# Patient Record
Sex: Male | Born: 1935 | Race: White | Hispanic: No | Smoking: Never smoker
Health system: Southern US, Community
[De-identification: ages and names within clinical notes are randomized; demographics above are authoritative.]

## PROBLEM LIST (undated history)

## (undated) DIAGNOSIS — I2510 Atherosclerotic heart disease of native coronary artery without angina pectoris: Secondary | ICD-10-CM

## (undated) DIAGNOSIS — I4891 Unspecified atrial fibrillation: Secondary | ICD-10-CM

## (undated) DIAGNOSIS — I255 Ischemic cardiomyopathy: Secondary | ICD-10-CM

## (undated) DIAGNOSIS — I639 Cerebral infarction, unspecified: Secondary | ICD-10-CM

## (undated) DIAGNOSIS — K627 Radiation proctitis: Principal | ICD-10-CM

## (undated) DIAGNOSIS — K922 Gastrointestinal hemorrhage, unspecified: Secondary | ICD-10-CM

## (undated) DIAGNOSIS — C61 Malignant neoplasm of prostate: Secondary | ICD-10-CM

## (undated) DIAGNOSIS — Z87442 Personal history of urinary calculi: Secondary | ICD-10-CM

## (undated) DIAGNOSIS — D369 Benign neoplasm, unspecified site: Secondary | ICD-10-CM

## (undated) DIAGNOSIS — I340 Nonrheumatic mitral (valve) insufficiency: Secondary | ICD-10-CM

## (undated) DIAGNOSIS — I236 Thrombosis of atrium, auricular appendage, and ventricle as current complications following acute myocardial infarction: Secondary | ICD-10-CM

## (undated) DIAGNOSIS — N200 Calculus of kidney: Secondary | ICD-10-CM

## (undated) HISTORY — PX: KNEE ARTHROSCOPY: SUR90

## (undated) HISTORY — DX: Radiation proctitis: K62.7

## (undated) HISTORY — DX: Benign neoplasm, unspecified site: D36.9

## (undated) HISTORY — DX: Cerebral infarction, unspecified: I63.9

## (undated) HISTORY — DX: Gastrointestinal hemorrhage, unspecified: K92.2

## (undated) HISTORY — DX: Ischemic cardiomyopathy: I25.5

## (undated) HISTORY — DX: Thrombosis of atrium, auricular appendage, and ventricle as current complications following acute myocardial infarction: I23.6

## (undated) HISTORY — DX: Atherosclerotic heart disease of native coronary artery without angina pectoris: I25.10

## (undated) HISTORY — DX: Malignant neoplasm of prostate: C61

## (undated) HISTORY — DX: Calculus of kidney: N20.0

---

## 1996-03-30 HISTORY — PX: CORONARY ARTERY BYPASS GRAFT: SHX141

## 1999-05-01 ENCOUNTER — Encounter: Payer: Self-pay | Admitting: Emergency Medicine

## 1999-05-01 ENCOUNTER — Emergency Department (HOSPITAL_COMMUNITY): Admission: EM | Admit: 1999-05-01 | Discharge: 1999-05-01 | Payer: Self-pay | Admitting: Emergency Medicine

## 1999-05-01 ENCOUNTER — Emergency Department (HOSPITAL_COMMUNITY): Admission: EM | Admit: 1999-05-01 | Discharge: 1999-05-02 | Payer: Self-pay | Admitting: Emergency Medicine

## 1999-05-02 ENCOUNTER — Encounter: Payer: Self-pay | Admitting: Emergency Medicine

## 2001-03-30 HISTORY — PX: OTHER SURGICAL HISTORY: SHX169

## 2001-11-17 ENCOUNTER — Ambulatory Visit (HOSPITAL_COMMUNITY): Admission: RE | Admit: 2001-11-17 | Discharge: 2001-11-17 | Payer: Self-pay | Admitting: *Deleted

## 2001-11-17 ENCOUNTER — Encounter (INDEPENDENT_AMBULATORY_CARE_PROVIDER_SITE_OTHER): Payer: Self-pay | Admitting: Specialist

## 2002-03-15 ENCOUNTER — Encounter: Payer: Self-pay | Admitting: Emergency Medicine

## 2002-03-15 ENCOUNTER — Emergency Department (HOSPITAL_COMMUNITY): Admission: EM | Admit: 2002-03-15 | Discharge: 2002-03-15 | Payer: Self-pay | Admitting: Emergency Medicine

## 2002-03-18 ENCOUNTER — Inpatient Hospital Stay (HOSPITAL_COMMUNITY): Admission: EM | Admit: 2002-03-18 | Discharge: 2002-03-18 | Payer: Self-pay | Admitting: Emergency Medicine

## 2002-03-18 ENCOUNTER — Encounter: Payer: Self-pay | Admitting: Urology

## 2002-03-20 ENCOUNTER — Ambulatory Visit (HOSPITAL_BASED_OUTPATIENT_CLINIC_OR_DEPARTMENT_OTHER): Admission: RE | Admit: 2002-03-20 | Discharge: 2002-03-20 | Payer: Self-pay | Admitting: Urology

## 2002-03-20 ENCOUNTER — Encounter: Payer: Self-pay | Admitting: Urology

## 2003-01-25 ENCOUNTER — Ambulatory Visit (HOSPITAL_COMMUNITY): Admission: RE | Admit: 2003-01-25 | Discharge: 2003-01-26 | Payer: Self-pay | Admitting: Cardiology

## 2003-03-11 ENCOUNTER — Emergency Department (HOSPITAL_COMMUNITY): Admission: EM | Admit: 2003-03-11 | Discharge: 2003-03-11 | Payer: Self-pay | Admitting: Emergency Medicine

## 2003-05-08 ENCOUNTER — Encounter: Admission: RE | Admit: 2003-05-08 | Discharge: 2003-05-08 | Payer: Self-pay | Admitting: Orthopedic Surgery

## 2004-02-14 ENCOUNTER — Ambulatory Visit: Payer: Self-pay

## 2004-02-18 ENCOUNTER — Ambulatory Visit: Payer: Self-pay | Admitting: Cardiology

## 2004-02-27 ENCOUNTER — Ambulatory Visit: Payer: Self-pay | Admitting: Cardiology

## 2004-02-27 ENCOUNTER — Inpatient Hospital Stay (HOSPITAL_COMMUNITY): Admission: AD | Admit: 2004-02-27 | Discharge: 2004-03-04 | Payer: Self-pay | Admitting: Cardiology

## 2004-03-07 ENCOUNTER — Ambulatory Visit: Payer: Self-pay | Admitting: Internal Medicine

## 2004-03-10 ENCOUNTER — Ambulatory Visit: Payer: Self-pay | Admitting: Cardiology

## 2004-03-18 ENCOUNTER — Ambulatory Visit: Payer: Self-pay | Admitting: Cardiology

## 2004-03-18 ENCOUNTER — Ambulatory Visit: Payer: Self-pay | Admitting: Internal Medicine

## 2004-03-19 ENCOUNTER — Emergency Department (HOSPITAL_COMMUNITY): Admission: EM | Admit: 2004-03-19 | Discharge: 2004-03-20 | Payer: Self-pay | Admitting: Emergency Medicine

## 2004-04-02 ENCOUNTER — Ambulatory Visit: Payer: Self-pay | Admitting: Cardiology

## 2004-04-09 ENCOUNTER — Ambulatory Visit: Payer: Self-pay | Admitting: *Deleted

## 2004-04-16 ENCOUNTER — Ambulatory Visit: Payer: Self-pay | Admitting: Internal Medicine

## 2004-05-07 ENCOUNTER — Ambulatory Visit: Payer: Self-pay | Admitting: Internal Medicine

## 2004-06-03 ENCOUNTER — Ambulatory Visit: Payer: Self-pay | Admitting: Cardiology

## 2004-07-02 ENCOUNTER — Ambulatory Visit: Payer: Self-pay | Admitting: *Deleted

## 2004-07-30 ENCOUNTER — Ambulatory Visit: Payer: Self-pay | Admitting: *Deleted

## 2004-08-05 ENCOUNTER — Ambulatory Visit: Payer: Self-pay | Admitting: Cardiology

## 2004-08-22 ENCOUNTER — Ambulatory Visit: Payer: Self-pay

## 2004-08-22 ENCOUNTER — Ambulatory Visit: Payer: Self-pay | Admitting: Cardiology

## 2004-08-28 ENCOUNTER — Ambulatory Visit: Payer: Self-pay | Admitting: Internal Medicine

## 2004-09-20 ENCOUNTER — Emergency Department (HOSPITAL_COMMUNITY): Admission: EM | Admit: 2004-09-20 | Discharge: 2004-09-20 | Payer: Self-pay | Admitting: Emergency Medicine

## 2004-09-24 ENCOUNTER — Ambulatory Visit: Payer: Self-pay | Admitting: Cardiology

## 2004-10-28 ENCOUNTER — Ambulatory Visit: Payer: Self-pay | Admitting: Internal Medicine

## 2004-11-26 ENCOUNTER — Ambulatory Visit: Payer: Self-pay | Admitting: *Deleted

## 2004-11-26 ENCOUNTER — Ambulatory Visit: Payer: Self-pay | Admitting: Cardiology

## 2004-12-24 ENCOUNTER — Ambulatory Visit: Payer: Self-pay | Admitting: Cardiology

## 2005-02-06 ENCOUNTER — Ambulatory Visit: Payer: Self-pay | Admitting: Cardiology

## 2005-03-04 ENCOUNTER — Ambulatory Visit: Payer: Self-pay | Admitting: *Deleted

## 2005-03-31 ENCOUNTER — Ambulatory Visit: Payer: Self-pay | Admitting: *Deleted

## 2005-05-04 ENCOUNTER — Ambulatory Visit: Payer: Self-pay | Admitting: Internal Medicine

## 2005-05-29 ENCOUNTER — Ambulatory Visit: Payer: Self-pay | Admitting: Cardiology

## 2005-06-10 ENCOUNTER — Ambulatory Visit: Payer: Self-pay

## 2005-06-10 ENCOUNTER — Encounter: Payer: Self-pay | Admitting: Cardiology

## 2005-06-26 ENCOUNTER — Ambulatory Visit: Payer: Self-pay | Admitting: Cardiovascular Disease

## 2005-07-01 ENCOUNTER — Ambulatory Visit: Payer: Self-pay | Admitting: Cardiology

## 2005-07-14 ENCOUNTER — Ambulatory Visit: Payer: Self-pay | Admitting: Cardiology

## 2005-07-30 ENCOUNTER — Ambulatory Visit: Payer: Self-pay | Admitting: Cardiology

## 2005-08-10 ENCOUNTER — Ambulatory Visit: Payer: Self-pay | Admitting: Cardiology

## 2005-08-31 ENCOUNTER — Ambulatory Visit: Payer: Self-pay | Admitting: Internal Medicine

## 2005-09-15 ENCOUNTER — Ambulatory Visit: Payer: Self-pay | Admitting: Cardiology

## 2005-10-13 ENCOUNTER — Ambulatory Visit: Payer: Self-pay | Admitting: Cardiovascular Disease

## 2005-11-10 ENCOUNTER — Ambulatory Visit: Payer: Self-pay | Admitting: Cardiology

## 2005-11-24 ENCOUNTER — Ambulatory Visit: Payer: Self-pay | Admitting: Cardiovascular Disease

## 2005-12-09 ENCOUNTER — Ambulatory Visit: Payer: Self-pay | Admitting: Cardiovascular Disease

## 2005-12-16 ENCOUNTER — Ambulatory Visit: Payer: Self-pay | Admitting: Cardiology

## 2005-12-29 ENCOUNTER — Ambulatory Visit: Payer: Self-pay | Admitting: *Deleted

## 2005-12-30 ENCOUNTER — Observation Stay (HOSPITAL_COMMUNITY): Admission: RE | Admit: 2005-12-30 | Discharge: 2005-12-31 | Payer: Self-pay | Admitting: Ophthalmology

## 2005-12-31 ENCOUNTER — Encounter: Payer: Self-pay | Admitting: Cardiology

## 2005-12-31 ENCOUNTER — Ambulatory Visit: Payer: Self-pay | Admitting: Cardiology

## 2006-01-08 ENCOUNTER — Ambulatory Visit: Payer: Self-pay | Admitting: Cardiology

## 2006-01-29 ENCOUNTER — Ambulatory Visit: Payer: Self-pay | Admitting: Internal Medicine

## 2006-02-01 ENCOUNTER — Ambulatory Visit: Payer: Self-pay | Admitting: Cardiology

## 2006-02-26 ENCOUNTER — Ambulatory Visit: Payer: Self-pay | Admitting: Cardiology

## 2006-03-24 ENCOUNTER — Ambulatory Visit: Payer: Self-pay | Admitting: Cardiology

## 2006-04-19 ENCOUNTER — Ambulatory Visit: Payer: Self-pay | Admitting: Internal Medicine

## 2006-05-17 ENCOUNTER — Ambulatory Visit: Payer: Self-pay | Admitting: Cardiology

## 2006-06-14 ENCOUNTER — Ambulatory Visit: Payer: Self-pay | Admitting: Cardiology

## 2006-07-12 ENCOUNTER — Ambulatory Visit: Payer: Self-pay | Admitting: Cardiology

## 2006-08-09 ENCOUNTER — Ambulatory Visit: Payer: Self-pay | Admitting: Cardiology

## 2006-09-06 ENCOUNTER — Ambulatory Visit: Payer: Self-pay | Admitting: Cardiovascular Disease

## 2006-09-20 ENCOUNTER — Ambulatory Visit: Payer: Self-pay | Admitting: Cardiology

## 2006-10-18 ENCOUNTER — Ambulatory Visit: Payer: Self-pay | Admitting: Cardiovascular Disease

## 2006-11-03 ENCOUNTER — Ambulatory Visit: Payer: Self-pay | Admitting: Cardiology

## 2006-11-11 ENCOUNTER — Ambulatory Visit: Payer: Self-pay

## 2006-11-22 ENCOUNTER — Ambulatory Visit: Payer: Self-pay | Admitting: Cardiology

## 2006-12-20 ENCOUNTER — Ambulatory Visit: Payer: Self-pay | Admitting: Cardiovascular Disease

## 2007-01-17 ENCOUNTER — Ambulatory Visit: Payer: Self-pay | Admitting: Cardiology

## 2007-02-14 ENCOUNTER — Ambulatory Visit: Payer: Self-pay | Admitting: Cardiology

## 2007-03-14 ENCOUNTER — Ambulatory Visit: Payer: Self-pay | Admitting: Cardiovascular Disease

## 2007-04-11 ENCOUNTER — Ambulatory Visit: Payer: Self-pay | Admitting: Cardiovascular Disease

## 2007-05-09 ENCOUNTER — Ambulatory Visit: Payer: Self-pay | Admitting: Cardiology

## 2007-05-19 ENCOUNTER — Ambulatory Visit: Payer: Self-pay | Admitting: Cardiology

## 2007-06-13 ENCOUNTER — Ambulatory Visit: Payer: Self-pay | Admitting: Internal Medicine

## 2007-07-11 ENCOUNTER — Ambulatory Visit: Payer: Self-pay | Admitting: Internal Medicine

## 2007-08-08 ENCOUNTER — Ambulatory Visit: Payer: Self-pay | Admitting: Cardiology

## 2007-08-19 ENCOUNTER — Ambulatory Visit: Payer: Self-pay | Admitting: Cardiology

## 2007-09-05 ENCOUNTER — Ambulatory Visit: Payer: Self-pay | Admitting: Cardiology

## 2007-10-03 ENCOUNTER — Ambulatory Visit: Payer: Self-pay | Admitting: Cardiology

## 2007-10-31 ENCOUNTER — Ambulatory Visit: Payer: Self-pay | Admitting: Internal Medicine

## 2007-11-28 ENCOUNTER — Ambulatory Visit: Payer: Self-pay | Admitting: Internal Medicine

## 2007-12-12 ENCOUNTER — Ambulatory Visit: Payer: Self-pay | Admitting: Cardiology

## 2008-01-09 ENCOUNTER — Ambulatory Visit: Payer: Self-pay | Admitting: Cardiology

## 2008-01-30 ENCOUNTER — Ambulatory Visit: Payer: Self-pay | Admitting: Cardiology

## 2008-02-29 ENCOUNTER — Ambulatory Visit: Payer: Self-pay | Admitting: Internal Medicine

## 2008-03-27 ENCOUNTER — Ambulatory Visit: Payer: Self-pay | Admitting: Cardiology

## 2008-04-23 ENCOUNTER — Ambulatory Visit: Payer: Self-pay | Admitting: Internal Medicine

## 2008-05-21 ENCOUNTER — Ambulatory Visit: Payer: Self-pay | Admitting: Cardiovascular Disease

## 2008-06-20 ENCOUNTER — Ambulatory Visit: Payer: Self-pay | Admitting: Cardiology

## 2008-07-16 ENCOUNTER — Ambulatory Visit: Payer: Self-pay | Admitting: Cardiology

## 2008-08-13 ENCOUNTER — Encounter: Payer: Self-pay | Admitting: Cardiology

## 2008-08-13 ENCOUNTER — Encounter (INDEPENDENT_AMBULATORY_CARE_PROVIDER_SITE_OTHER): Payer: Self-pay | Admitting: *Deleted

## 2008-08-13 LAB — CONVERTED CEMR LAB
ALT: 14 units/L
AST: 16 units/L
Alkaline Phosphatase: 32 units/L
BUN: 23 mg/dL
CO2: 26 meq/L
Calcium: 8.7 mg/dL
Chloride: 110 meq/L
Cholesterol: 117 mg/dL
Creatinine, Ser: 0.9 mg/dL
GGT: 95 units/L
HDL: 34 mg/dL
LDL Cholesterol: 74 mg/dL
Potassium: 4 meq/L
Sodium: 140 meq/L
Total Bilirubin: 0.4 mg/dL
Total Protein: 5.8 g/dL
Triglyceride fasting, serum: 44 mg/dL

## 2008-08-14 ENCOUNTER — Ambulatory Visit: Payer: Self-pay | Admitting: Cardiovascular Disease

## 2008-08-20 ENCOUNTER — Encounter (INDEPENDENT_AMBULATORY_CARE_PROVIDER_SITE_OTHER): Payer: Self-pay | Admitting: *Deleted

## 2008-08-28 ENCOUNTER — Encounter: Payer: Self-pay | Admitting: *Deleted

## 2008-09-10 ENCOUNTER — Ambulatory Visit: Payer: Self-pay | Admitting: Cardiology

## 2008-09-10 LAB — CONVERTED CEMR LAB
POC INR: 1.8
Protime: 16.4

## 2008-09-14 DIAGNOSIS — G459 Transient cerebral ischemic attack, unspecified: Secondary | ICD-10-CM | POA: Insufficient documentation

## 2008-09-14 DIAGNOSIS — I2510 Atherosclerotic heart disease of native coronary artery without angina pectoris: Secondary | ICD-10-CM

## 2008-09-14 DIAGNOSIS — I428 Other cardiomyopathies: Secondary | ICD-10-CM

## 2008-09-14 DIAGNOSIS — K921 Melena: Secondary | ICD-10-CM

## 2008-09-14 DIAGNOSIS — I6350 Cerebral infarction due to unspecified occlusion or stenosis of unspecified cerebral artery: Secondary | ICD-10-CM | POA: Insufficient documentation

## 2008-09-14 DIAGNOSIS — H269 Unspecified cataract: Secondary | ICD-10-CM | POA: Insufficient documentation

## 2008-09-14 DIAGNOSIS — J45909 Unspecified asthma, uncomplicated: Secondary | ICD-10-CM | POA: Insufficient documentation

## 2008-09-14 DIAGNOSIS — E785 Hyperlipidemia, unspecified: Secondary | ICD-10-CM

## 2008-09-18 ENCOUNTER — Ambulatory Visit: Payer: Self-pay | Admitting: Cardiology

## 2008-09-18 DIAGNOSIS — I238 Other current complications following acute myocardial infarction: Secondary | ICD-10-CM

## 2008-10-03 ENCOUNTER — Encounter: Payer: Self-pay | Admitting: *Deleted

## 2008-10-09 ENCOUNTER — Ambulatory Visit: Payer: Self-pay | Admitting: Cardiology

## 2008-10-09 LAB — CONVERTED CEMR LAB
POC INR: 2.7
Prothrombin Time: 19.9 s

## 2008-11-05 ENCOUNTER — Ambulatory Visit: Payer: Self-pay | Admitting: Internal Medicine

## 2008-11-05 LAB — CONVERTED CEMR LAB
POC INR: 2.9
Prothrombin Time: 20.7 s

## 2008-12-10 ENCOUNTER — Ambulatory Visit: Payer: Self-pay | Admitting: Internal Medicine

## 2008-12-10 LAB — CONVERTED CEMR LAB: POC INR: 1.8

## 2009-01-07 ENCOUNTER — Ambulatory Visit: Payer: Self-pay | Admitting: Internal Medicine

## 2009-01-07 LAB — CONVERTED CEMR LAB: POC INR: 2.9

## 2009-02-05 ENCOUNTER — Ambulatory Visit: Payer: Self-pay | Admitting: Cardiology

## 2009-02-05 LAB — CONVERTED CEMR LAB: POC INR: 3

## 2009-03-06 ENCOUNTER — Ambulatory Visit: Payer: Self-pay | Admitting: Cardiology

## 2009-03-06 LAB — CONVERTED CEMR LAB: POC INR: 2.7

## 2009-04-04 ENCOUNTER — Ambulatory Visit: Payer: Self-pay | Admitting: Cardiovascular Disease

## 2009-04-04 LAB — CONVERTED CEMR LAB: POC INR: 3.3

## 2009-04-30 ENCOUNTER — Ambulatory Visit: Payer: Self-pay | Admitting: Internal Medicine

## 2009-04-30 LAB — CONVERTED CEMR LAB: POC INR: 1.8

## 2009-05-28 ENCOUNTER — Ambulatory Visit: Payer: Self-pay | Admitting: Internal Medicine

## 2009-05-28 LAB — CONVERTED CEMR LAB: POC INR: 2.5

## 2009-06-25 ENCOUNTER — Ambulatory Visit: Payer: Self-pay | Admitting: Cardiology

## 2009-06-25 LAB — CONVERTED CEMR LAB: POC INR: 2.4

## 2009-07-23 ENCOUNTER — Ambulatory Visit: Payer: Self-pay | Admitting: Cardiology

## 2009-07-23 LAB — CONVERTED CEMR LAB: POC INR: 2.5

## 2009-08-09 ENCOUNTER — Ambulatory Visit: Payer: Self-pay | Admitting: Cardiovascular Disease

## 2009-08-09 LAB — CONVERTED CEMR LAB: POC INR: 1.7

## 2009-08-13 ENCOUNTER — Ambulatory Visit: Admission: RE | Admit: 2009-08-13 | Discharge: 2009-11-11 | Payer: Self-pay | Admitting: Radiation Oncology

## 2009-08-14 ENCOUNTER — Encounter: Payer: Self-pay | Admitting: Cardiology

## 2009-08-22 ENCOUNTER — Telehealth: Payer: Self-pay | Admitting: Cardiology

## 2009-09-17 ENCOUNTER — Ambulatory Visit: Payer: Self-pay | Admitting: Internal Medicine

## 2009-09-17 LAB — CONVERTED CEMR LAB: POC INR: 1.7

## 2009-09-26 ENCOUNTER — Ambulatory Visit: Payer: Self-pay | Admitting: Cardiology

## 2009-09-26 LAB — CONVERTED CEMR LAB: POC INR: 3.1

## 2009-10-24 ENCOUNTER — Ambulatory Visit: Payer: Self-pay | Admitting: Internal Medicine

## 2009-10-24 LAB — CONVERTED CEMR LAB: POC INR: 2.7

## 2009-11-01 ENCOUNTER — Ambulatory Visit: Payer: Self-pay | Admitting: Cardiology

## 2009-11-11 ENCOUNTER — Ambulatory Visit: Admission: RE | Admit: 2009-11-11 | Discharge: 2009-11-26 | Payer: Self-pay | Admitting: Radiation Oncology

## 2009-11-20 ENCOUNTER — Ambulatory Visit: Payer: Self-pay | Admitting: Internal Medicine

## 2009-11-20 LAB — CONVERTED CEMR LAB: POC INR: 3.1

## 2009-11-27 ENCOUNTER — Encounter: Payer: Self-pay | Admitting: Cardiology

## 2009-12-01 ENCOUNTER — Emergency Department (HOSPITAL_COMMUNITY): Admission: EM | Admit: 2009-12-01 | Discharge: 2009-12-02 | Payer: Self-pay | Admitting: Emergency Medicine

## 2009-12-19 ENCOUNTER — Ambulatory Visit: Payer: Self-pay | Admitting: Cardiology

## 2009-12-19 ENCOUNTER — Telehealth (INDEPENDENT_AMBULATORY_CARE_PROVIDER_SITE_OTHER): Payer: Self-pay | Admitting: *Deleted

## 2009-12-19 LAB — CONVERTED CEMR LAB: POC INR: 3.4

## 2009-12-23 ENCOUNTER — Encounter (HOSPITAL_COMMUNITY): Admission: RE | Admit: 2009-12-23 | Discharge: 2010-03-03 | Payer: Self-pay | Admitting: Cardiology

## 2009-12-23 ENCOUNTER — Encounter: Payer: Self-pay | Admitting: Internal Medicine

## 2009-12-23 ENCOUNTER — Ambulatory Visit: Payer: Self-pay

## 2009-12-23 ENCOUNTER — Ambulatory Visit: Payer: Self-pay | Admitting: Internal Medicine

## 2010-01-02 ENCOUNTER — Ambulatory Visit: Payer: Self-pay | Admitting: Internal Medicine

## 2010-01-02 LAB — CONVERTED CEMR LAB: POC INR: 2.1

## 2010-01-30 ENCOUNTER — Ambulatory Visit: Payer: Self-pay | Admitting: Cardiology

## 2010-01-30 LAB — CONVERTED CEMR LAB: POC INR: 2.4

## 2010-02-27 ENCOUNTER — Ambulatory Visit: Payer: Self-pay | Admitting: Cardiology

## 2010-02-27 LAB — CONVERTED CEMR LAB: POC INR: 2.2

## 2010-03-26 ENCOUNTER — Ambulatory Visit: Payer: Self-pay | Admitting: Cardiology

## 2010-03-26 LAB — CONVERTED CEMR LAB: INR: 1.7

## 2010-03-30 DIAGNOSIS — I639 Cerebral infarction, unspecified: Secondary | ICD-10-CM

## 2010-03-30 DIAGNOSIS — C61 Malignant neoplasm of prostate: Secondary | ICD-10-CM

## 2010-03-30 DIAGNOSIS — Z8673 Personal history of transient ischemic attack (TIA), and cerebral infarction without residual deficits: Secondary | ICD-10-CM

## 2010-03-30 HISTORY — PX: OTHER SURGICAL HISTORY: SHX169

## 2010-03-30 HISTORY — DX: Cerebral infarction, unspecified: I63.9

## 2010-03-30 HISTORY — DX: Malignant neoplasm of prostate: C61

## 2010-04-09 ENCOUNTER — Ambulatory Visit: Admission: RE | Admit: 2010-04-09 | Discharge: 2010-04-09 | Payer: Self-pay | Source: Home / Self Care

## 2010-04-09 LAB — CONVERTED CEMR LAB: POC INR: 1.6

## 2010-04-29 NOTE — Medication Information (Signed)
Summary: rov/eac  Anticoagulant Therapy  Managed by: Cloyde Reams, RN, BSN Referring MD: Olga Millers MD Supervising MD: Shirlee Latch MD, Jereline Ticer Indication 1: Atrial Fibrillation (ICD-427.31) Lab Used: [facility] Site: [facility] INR POC 2.5 INR RANGE 2 - 3  Dietary changes: no    Health status changes: no    Bleeding/hemorrhagic complications: no    Recent/future hospitalizations: yes       Details: Having bx on 07/30/09, will be stopping coumadin 5 days prior to procedure.    Any changes in medication regimen? no    Recent/future dental: no  Any missed doses?: no       Is patient compliant with meds? yes       Allergies (verified): 1)  ! Morphine  Anticoagulation Management History:      The patient is taking warfarin and comes in today for a routine follow up visit.  Positive risk factors for bleeding include an age of 84 years or older, history of CVA/TIA, and history of GI bleeding.  The bleeding index is 'high risk'.  Positive CHADS2 values include Age > 65 years old and Prior Stroke/CVA/TIA.  The start date was 02/18/2004.  Anticoagulation responsible provider: Shirlee Latch MD, Tekisha Darcey.  INR POC: 2.5.  Cuvette Lot#: [id number].  Exp: 08/2010.    Anticoagulation Management Assessment/Plan:      The patient's current anticoagulation dose is Warfarin sodium 5 mg tabs: Use as directed by Anticoagulation Clinic.  The target INR is 2 - 3.  The next INR is due 08/06/2009.  Anticoagulation instructions were given to patient.  Results were reviewed/authorized by Cloyde Reams, RN, BSN.  He was notified by Cloyde Reams RN.         Prior Anticoagulation Instructions: INR 2.4  Continue taking 1 tablet every day, except take 1/2 tablet on Saturday.  Return to clinic in 4 weeks.   Current Anticoagulation Instructions: INR 2.5  Continue on same dosage 1 tablet daily except 1/2 tablet on Saturdays.  Recheck in 1 week post biopsy.

## 2010-04-29 NOTE — Medication Information (Signed)
Summary: rov/cb  Anticoagulant Therapy  Managed by: Cloyde Reams, RN, BSN Referring MD: Olga Millers MD Supervising MD: Jens Som MD, Arlys John Indication 1: Atrial Fibrillation (ICD-427.31) Lab Used: [facility] Site: [facility] INR POC 3.1 INR RANGE 2 - 3  Dietary changes: no    Health status changes: no    Bleeding/hemorrhagic complications: no    Recent/future hospitalizations: no    Any changes in medication regimen? no    Recent/future dental: no  Any missed doses?: no       Is patient compliant with meds? yes       Allergies: 1)  ! Morphine  Anticoagulation Management History:      The patient is taking warfarin and comes in today for a routine follow up visit.  Positive risk factors for bleeding include an age of 55 years or older, history of CVA/TIA, and history of GI bleeding.  The bleeding index is 'high risk'.  Positive CHADS2 values include Age > 4 years old and Prior Stroke/CVA/TIA.  The start date was 02/18/2004.  Anticoagulation responsible provider: Jens Som MD, Arlys John.  INR POC: 3.1.  Cuvette Lot#: [id number].  Exp: 11/2010.    Anticoagulation Management Assessment/Plan:      The patient's current anticoagulation dose is Warfarin sodium 5 mg tabs: Use as directed by Anticoagulation Clinic.  The target INR is 2 - 3.  The next INR is due 10/24/2009.  Anticoagulation instructions were given to patient.  Results were reviewed/authorized by Cloyde Reams, RN, BSN.  He was notified by Cloyde Reams RN.         Prior Anticoagulation Instructions: INR 1.7. Take 1 1/2 tablets today, then take 1 tablet daily. Recheck in 10 days.  Current Anticoagulation Instructions: INR 3.1  Take 1/2 tablet today, then resume same dosage 1 tablet daily except 1/2 tablet on Saturdays.  Recheck in 3 weeks.

## 2010-04-29 NOTE — Medication Information (Signed)
Summary: [name]  Anticoagulant Therapy  Managed by: Weston Brass, PharmD Referring MD: Olga Millers MD Supervising MD: Eden Emms MD, Theron Arista Indication 1: Atrial Fibrillation (ICD-427.31) Lab Used: [facility] Site: [facility] INR POC 1.7 INR RANGE 2 - 3  Dietary changes: no    Health status changes: yes       Details: biopsy positive for prostate cancer.  Has appt with MD next week to determine treatment options  Bleeding/hemorrhagic complications: no    Recent/future hospitalizations: no    Any changes in medication regimen? no    Recent/future dental: no  Any missed doses?: yes     Details: off Coumadin for biopsy.  Restarted Coumadin on 5/5  Is patient compliant with meds? yes       Allergies: 1)  ! Morphine  Anticoagulation Management History:      The patient is taking warfarin and comes in today for a routine follow up visit.  Positive risk factors for bleeding include an age of 30 years or older, history of CVA/TIA, and history of GI bleeding.  The bleeding index is 'high risk'.  Positive CHADS2 values include Age > 52 years old and Prior Stroke/CVA/TIA.  The start date was 02/18/2004.  Anticoagulation responsible provider: Eden Emms MD, Theron Arista.  INR POC: 1.7.  Cuvette Lot#: [id number].  Exp: 10/2010.    Anticoagulation Management Assessment/Plan:      The patient's current anticoagulation dose is Warfarin sodium 5 mg tabs: Use as directed by Anticoagulation Clinic.  The target INR is 2 - 3.  The next INR is due 08/29/2009.  Anticoagulation instructions were given to patient.  Results were reviewed/authorized by Weston Brass, PharmD.  He was notified by Weston Brass PharmD.         Prior Anticoagulation Instructions: INR 2.5  Continue on same dosage 1 tablet daily except 1/2 tablet on Saturdays.  Recheck in 1 week post biopsy.    Current Anticoagulation Instructions: INR 1.7  Take 1 tablet today and tomorrow then resume same dose of 1 tablet every day except 1/2 tablet on  Saturday

## 2010-04-29 NOTE — Medication Information (Signed)
Summary: [name]   Anticoagulant Therapy  Managed by: Lyna Poser, PharmD Referring MD: Olga Millers MD PCP: Oletha Cruel MD: Jens Som MD, Arlys John Indication 1: Atrial Fibrillation (ICD-427.31) Lab Used: [facility] Site: [facility] INR POC 2.2 INR RANGE 2 - 3  Dietary changes: no    Health status changes: no    Bleeding/hemorrhagic complications: no    Recent/future hospitalizations: no    Any changes in medication regimen? no    Recent/future dental: no  Any missed doses?: no       Is patient compliant with meds? yes       Allergies: 1)  ! Morphine  Anticoagulation Management History:      The patient is taking warfarin and comes in today for a routine follow up visit.  Positive risk factors for bleeding include an age of 75 years or older, history of CVA/TIA, and history of GI bleeding.  The bleeding index is 'high risk'.  Positive CHADS2 values include Age > 13 years old and Prior Stroke/CVA/TIA.  The start date was 02/18/2004.  Anticoagulation responsible provider: Jens Som MD, Arlys John.  INR POC: 2.2.  Cuvette Lot#: [id number].  Exp: 02/2011.    Anticoagulation Management Assessment/Plan:      The patient's current anticoagulation dose is Warfarin sodium 5 mg tabs: Use as directed by Anticoagulation Clinic.  The target INR is 2 - 3.  The next INR is due 03/26/2010.  Anticoagulation instructions were given to patient.  Results were reviewed/authorized by Lyna Poser, PharmD.         Prior Anticoagulation Instructions: INR 2.4  Continue on same dosage 1 tablet daily except 1/2 tablet on Tuesdays and Saturdays.  Recheck in 4 weeks.  Current Anticoagulation Instructions: INR 2.2  Continue taking a half tablet on tuesday and saturday. And 1 tablet all other days. Recheck in 4 weeks.

## 2010-04-29 NOTE — Letter (Signed)
[facility]   Imported By: Roderic Ovens 01/01/2010 15:06:20  _____________________________________________________________________  External Attachment:    Type:   Image     Comment:   External Document

## 2010-04-29 NOTE — Medication Information (Signed)
Summary: rov/ewj  Anticoagulant Therapy  Managed by: Bethena Midget, RN, BSN Referring MD: Olga Millers MD Supervising MD: Gala Romney MD, Reuel Boom Indication 1: Atrial Fibrillation (ICD-427.31) Lab Used: [facility] Site: [facility] INR POC 2.5 INR RANGE 2 - 3  Dietary changes: no    Health status changes: no    Bleeding/hemorrhagic complications: no    Recent/future hospitalizations: no    Any changes in medication regimen? no    Recent/future dental: no  Any missed doses?: no       Is patient compliant with meds? yes       Allergies: 1)  ! Morphine  Anticoagulation Management History:      The patient is taking warfarin and comes in today for a routine follow up visit.  Positive risk factors for bleeding include an age of 68 years or older, history of CVA/TIA, and history of GI bleeding.  The bleeding index is 'high risk'.  Positive CHADS2 values include Age > 30 years old and Prior Stroke/CVA/TIA.  The start date was 02/18/2004.  Anticoagulation responsible provider: Kyleigha Markert MD, Reuel Boom.  INR POC: 2.5.  Cuvette Lot#: [id number].  Exp: 06/2010.    Anticoagulation Management Assessment/Plan:      The patient's current anticoagulation dose is Warfarin sodium 5 mg tabs: Use as directed by Anticoagulation Clinic.  The target INR is 2 - 3.  The next INR is due 06/25/2009.  Anticoagulation instructions were given to patient.  Results were reviewed/authorized by Bethena Midget, RN, BSN.  He was notified by Bethena Midget, RN, BSN.         Prior Anticoagulation Instructions: INR 1.8  Start taking 1 tablet daily except 1/2 tablet on Saturdays.  Recheck in 3-4 weeks.    Current Anticoagulation Instructions: INR 2.5 Continue 5mg s daily except 2.5mg s on Saturdays. Recheck in 4 weeks.

## 2010-04-29 NOTE — Medication Information (Signed)
Summary: rov/tm  Anticoagulant Therapy  Managed by: Eda Keys, PharmD Referring MD: Olga Millers MD Supervising MD: Shirlee Latch MD, Kamare Caspers Indication 1: Atrial Fibrillation (ICD-427.31) Lab Used: [facility] Site: [facility] INR POC 2.4 INR RANGE 2 - 3  Dietary changes: no    Health status changes: no    Bleeding/hemorrhagic complications: no    Recent/future hospitalizations: no    Any changes in medication regimen? no    Recent/future dental: no  Any missed doses?: no       Is patient compliant with meds? yes       Allergies: 1)  ! Morphine  Anticoagulation Management History:      The patient is taking warfarin and comes in today for a routine follow up visit.  Positive risk factors for bleeding include an age of 75 years or older, history of CVA/TIA, and history of GI bleeding.  The bleeding index is 'high risk'.  Positive CHADS2 values include Age > 35 years old and Prior Stroke/CVA/TIA.  The start date was 02/18/2004.  Anticoagulation responsible provider: Shirlee Latch MD, Jeston Junkins.  INR POC: 2.4.  Cuvette Lot#: [id number].  Exp: 07/2010.    Anticoagulation Management Assessment/Plan:      The patient's current anticoagulation dose is Warfarin sodium 5 mg tabs: Use as directed by Anticoagulation Clinic.  The target INR is 2 - 3.  The next INR is due 07/23/2009.  Anticoagulation instructions were given to patient.  Results were reviewed/authorized by Eda Keys, PharmD.  He was notified by Eda Keys.         Prior Anticoagulation Instructions: INR 2.5 Continue 5mg s daily except 2.5mg s on Saturdays. Recheck in 4 weeks.   Current Anticoagulation Instructions: INR 2.4  Continue taking 1 tablet every day, except take 1/2 tablet on Saturday.  Return to clinic in 4 weeks.

## 2010-04-29 NOTE — Progress Notes (Signed)
   Phone Note From Other Clinic   Caller: brenda from dr Larey Dresser office Summary of Call: pt needing seed implant and radiation to the prostate. need okay for pt to hold coumadin and aspirin 5 days prior to the procedure. scheduled for june 13th. will foward for dr Jens Som review. note to be faxed to [phone]. Deliah Goody, RN  Aug 22, 2009 2:43 PM   Follow-up for Phone Call        ok to hold coumadin 4 days prior to procedure and resume day of (h/o apical thrombus but no atrial fibrillation). Ferman Hamming, MD, [facility]  Aug 22, 2009 2:51 PM  okay to hold the asa also? Deliah Goody, RN  Aug 23, 2009 12:01 PM   Additional Follow-up for Phone Call Additional follow up Details #1::        yes Ferman Hamming, MD, [facility]  Aug 26, 2009 10:19 AM  note fowarded to dr Nelma Rothman, RN  Aug 27, 2009 2:23 PM\par

## 2010-04-29 NOTE — Progress Notes (Signed)
Summary: Nuclear pre procedure  Phone Note Outgoing Call Call back at [facility] Phone [phone]   Call placed by: Rea College, CMA,  December 19, 2009 4:56 PM Call placed to: Patient Summary of Call: Reviewed information on [employer] Information Sheet (see scanned document for further details).  Spoke with patient.       Nuclear Med Background Indications for Stress Test: Evaluation for Ischemia, Graft Patency   History: Asthma, CABG, COPD, Myocardial Perfusion Study, Stents  History Comments: '08 [id number] infarct, no ischemia, EF=40%     Nuclear Pre-Procedure Cardiac Risk Factors: Carotid Disease, CVA, Family History - CAD, Hypertension, Lipids Height (in): 70  Nuclear Med Study Referring MD:  Olga Millers MD

## 2010-04-29 NOTE — Medication Information (Signed)
Summary: rov/mw  Anticoagulant Therapy  Managed by: Cloyde Reams, RN, BSN Referring MD: Olga Millers MD PCP: Oletha Cruel MD: Bensimhon MD,Daniel Indication 1: Atrial Fibrillation (ICD-427.31) Lab Used: [facility] Site: [facility] INR POC 2.1 INR RANGE 2 - 3  Dietary changes: no    Health status changes: no    Bleeding/hemorrhagic complications: no    Recent/future hospitalizations: no    Any changes in medication regimen? no    Recent/future dental: no  Any missed doses?: no       Is patient compliant with meds? yes       Allergies: 1)  ! Morphine  Anticoagulation Management History:      The patient is taking warfarin and comes in today for a routine follow up visit.  Positive risk factors for bleeding include an age of 75 years or older, history of CVA/TIA, and history of GI bleeding.  The bleeding index is 'high risk'.  Positive CHADS2 values include Age > 75 years old and Prior Stroke/CVA/TIA.  The start date was 02/18/2004.  Anticoagulation responsible Jetty Berland: Bensimhon MD,Daniel.  INR POC: 2.1.  Cuvette Lot#: [id number].  Exp: 01/2011.    Anticoagulation Management Assessment/Plan:      The patient's current anticoagulation dose is Warfarin sodium 5 mg tabs: Use as directed by Anticoagulation Clinic.  The target INR is 2 - 3.  The next INR is due 01/30/2010.  Anticoagulation instructions were given to patient.  Results were reviewed/authorized by Cloyde Reams, RN, BSN.  He was notified by Cloyde Reams RN.         Prior Anticoagulation Instructions: INR 3.4 Skip your dose today. Change your tuesday dose to a half tablet. Continue taking the half tablet on saturday. Take 1 tablet all other days. Recheck in 2 weeks.  Current Anticoagulation Instructions: INR 2.1  Continue on same dosage 1 tablet daily except 1/2 tablet on Tuesdays and Saturdays.  Recheck in 4 weeks.

## 2010-04-29 NOTE — Medication Information (Signed)
Summary: [facility]  Anticoagulant Therapy  Managed by: Lyna Poser, PharmD Referring MD: Olga Millers MD PCP: Oletha Cruel MD: Bensimhon MD,Daniel Indication 1: Atrial Fibrillation (ICD-427.31) Lab Used: [facility] Site: [facility] INR POC 3.4 INR RANGE 2 - 3  Dietary changes: no    Health status changes: no    Bleeding/hemorrhagic complications: no    Recent/future hospitalizations: no    Any changes in medication regimen? no    Recent/future dental: no  Any missed doses?: no       Is patient compliant with meds? yes       Allergies: 1)  ! Morphine  Anticoagulation Management History:      The patient is taking warfarin and comes in today for a routine follow up visit.  Positive risk factors for bleeding include an age of 75 years or older, history of CVA/TIA, and history of GI bleeding.  The bleeding index is 'high risk'.  Positive CHADS2 values include Age > 4 years old and Prior Stroke/CVA/TIA.  The start date was 02/18/2004.  Anticoagulation responsible provider: Bensimhon MD,Daniel.  INR POC: 3.4.  Cuvette Lot#: [id number].  Exp: 12/2010.    Anticoagulation Management Assessment/Plan:      The patient's current anticoagulation dose is Warfarin sodium 5 mg tabs: Use as directed by Anticoagulation Clinic.  The target INR is 2 - 3.  The next INR is due 01/02/2010.  Anticoagulation instructions were given to patient.  Results were reviewed/authorized by Lyna Poser, PharmD.         Prior Anticoagulation Instructions: INR 3.1  Today, Wednesday, August 24th, take Coumadin 0.5 tab (2.5 mg). Then, continue taking Coumadin 1 tab (5 mg) all days except Coumadin 0.5 tab (2.5 mg) on Saturdays. Return to clinic in 4 weeks per patient request.   Current Anticoagulation Instructions: INR 3.4 Skip your dose today. Change your tuesday dose to a half tablet. Continue taking the half tablet on saturday. Take 1 tablet all other days. Recheck in 2 weeks.

## 2010-04-29 NOTE — Letter (Signed)
[facility]   Imported By: Roderic Ovens 10/01/2009 09:16:13  _____________________________________________________________________  External Attachment:    Type:   Image     Comment:   External Document

## 2010-04-29 NOTE — Medication Information (Signed)
Summary: rov/tm  Anticoagulant Therapy  Managed by: Cloyde Reams, RN, BSN Referring MD: Olga Millers MD Supervising MD: Excell Seltzer MD, Casimiro Needle Indication 1: Atrial Fibrillation (ICD-427.31) Lab Used: [facility] Site: [facility] INR POC 3.3 INR RANGE 2 - 3  Dietary changes: yes       Details: Incr food intake over the holidays.    Health status changes: no    Bleeding/hemorrhagic complications: no    Recent/future hospitalizations: no    Any changes in medication regimen? no    Recent/future dental: no  Any missed doses?: no       Is patient compliant with meds? yes       Allergies (verified): 1)  ! Morphine  Anticoagulation Management History:      The patient is taking warfarin and comes in today for a routine follow up visit.  Positive risk factors for bleeding include an age of 75 years or older, history of CVA/TIA, and history of GI bleeding.  The bleeding index is 'high risk'.  Positive CHADS2 values include Age > 75 years old and Prior Stroke/CVA/TIA.  The start date was 02/18/2004.  Anticoagulation responsible provider: Excell Seltzer MD, Casimiro Needle.  INR POC: 3.3.  Cuvette Lot#: [id number].  Exp: 04/2010.    Anticoagulation Management Assessment/Plan:      The patient's current anticoagulation dose is Warfarin sodium 5 mg tabs: Use as directed by Anticoagulation Clinic.  The target INR is 2 - 3.  The next INR is due 04/30/2009.  Anticoagulation instructions were given to patient.  Results were reviewed/authorized by Cloyde Reams, RN, BSN.  He was notified by Cloyde Reams RN.         Prior Anticoagulation Instructions: INR 2.7 Continue 5mg s daily except 2.5mg s on Saturdays. Recheck in 4 weeks.   Current Anticoagulation Instructions: INR 3.3  Take 1/2 tablet today and then start taking 1 tablet daily except 1/2 tablet on Tuesdays and Saturdays.   Recheck in 3-4.

## 2010-04-29 NOTE — Medication Information (Signed)
Summary: rov/tm  Anticoagulant Therapy  Managed by: Elaina Pattee, PharmD Referring MD: Olga Millers MD Supervising MD: Gala Romney MD, Reuel Boom Indication 1: Atrial Fibrillation (ICD-427.31) Lab Used: [facility] Site: [facility] INR POC 1.7 INR RANGE 2 - 3  Dietary changes: yes       Details: Has eaten more salads.  Health status changes: no    Bleeding/hemorrhagic complications: no    Recent/future hospitalizations: yes       Details: Pt just restarted Coumadin last week after biopsy.  Any changes in medication regimen? no    Recent/future dental: no  Any missed doses?: no       Is patient compliant with meds? yes       Allergies: 1)  ! Morphine  Anticoagulation Management History:      The patient is taking warfarin and comes in today for a routine follow up visit.  Positive risk factors for bleeding include an age of 75 years or older, history of CVA/TIA, and history of GI bleeding.  The bleeding index is 'high risk'.  Positive CHADS2 values include Age > 75 years old and Prior Stroke/CVA/TIA.  The start date was 02/18/2004.  Anticoagulation responsible provider: Bensimhon MD, Reuel Boom.  INR POC: 1.7.  Cuvette Lot#: [id number].  Exp: 10/2010.    Anticoagulation Management Assessment/Plan:      The patient's current anticoagulation dose is Warfarin sodium 5 mg tabs: Use as directed by Anticoagulation Clinic.  The target INR is 2 - 3.  The next INR is due 09/26/2009.  Anticoagulation instructions were given to patient.  Results were reviewed/authorized by Elaina Pattee, PharmD.  He was notified by Elaina Pattee, PharmD.         Prior Anticoagulation Instructions: INR 1.7  Take 1 tablet today and tomorrow then resume same dose of 1 tablet every day except 1/2 tablet on Saturday   Current Anticoagulation Instructions: INR 1.7. Take 1 1/2 tablets today, then take 1 tablet daily. Recheck in 10 days.  Appended Document: [facility]    Anticoagulant Therapy  Managed  by: Elaina Pattee, PharmD Referring MD: Olga Millers MD Supervising MD: Gala Romney MD, Reuel Boom Indication 1: Atrial Fibrillation (ICD-427.31) Lab Used: [facility] Site: [facility] INR RANGE 2 - 3           Allergies: 1)  ! Morphine  Anticoagulation Management History:      Positive risk factors for bleeding include an age of 75 years or older, history of CVA/TIA, and history of GI bleeding.  The bleeding index is 'high risk'.  Positive CHADS2 values include Age > 75 years old and Prior Stroke/CVA/TIA.  The start date was 02/18/2004.  Anticoagulation responsible provider: Bensimhon MD, Reuel Boom.  Exp: 10/2010.    Anticoagulation Management Assessment/Plan:      The patient's current anticoagulation dose is Warfarin sodium 5 mg tabs: Use as directed by Anticoagulation Clinic.  The target INR is 2 - 3.  The next INR is due 09/26/2009.  Anticoagulation instructions were given to patient.  Results were reviewed/authorized by Elaina Pattee, PharmD.         Prior Anticoagulation Instructions: INR 1.7. Take 1 1/2 tablets today, then take 1 tablet daily. Recheck in 10 days.

## 2010-04-29 NOTE — Assessment & Plan Note (Signed)
Summary: per walk in/saf /    Primary Provider:  [name]  CC:  check up.  History of Present Illness: The patient is a very pleasant  gentleman who has a history of coronary artery disease status post coronary bypassing graft in 1990. His most recent Myoview was performed on November 11, 2006.  The patient's ejection fraction was 40%.  There was a previous apical infarct, but there was no ischemia noted.  He also has a history of an apical thrombus as well as a prior CVA felt possibly secondary embolic in nature. Prior abdominal ultrasound in May 2006 showed no aneurysm. Carotid Dopplers in November of 2005 showed 0-39% bilaterally. MRA in October of 2007 showed no obstructive disease. I last saw him in June of 2010. Since then he is doing well. There is no dyspnea on exertion, orthopnea, PND, pedal edema, palpitations, syncope or chest pain.  Current Medications (verified): 1)  Aspirin 81 Mg Tbec (Aspirin) .... Take One Tablet By Mouth Daily 2)  Ramipril 2.5 Mg Caps (Ramipril) .... Take One Capsule By Mouth Two Times A Day 3)  Pravastatin Sodium 20 Mg Tabs (Pravastatin Sodium) .... Take One Tablet By Mouth Daily At Bedtime 4)  Fenofibrate Micronized 200 Mg Caps (Fenofibrate Micronized) .Marland Kitchen.. 1 Tab By Mouth Once Daily 5)  Warfarin Sodium 5 Mg Tabs (Warfarin Sodium) .... Use As Directed By Anticoagulation Clinic 6)  Flomax 0.4 Mg Xr24h-Cap (Tamsulosin Hcl) .... Take 1 Capsule Daily 7)  Advair Diskus 100-50 Mcg/dose Misc (Fluticasone-Salmeterol) .... Inhale 1 Puff By Mouth Daily 8)  Xalatan 0.005 % Soln (Latanoprost) .... Instill 1 Drop Into Left Eye Once Daily 9)  Multivitamins   Tabs (Multiple Vitamin) .Marland Kitchen.. 1 Tab By Mouth Once Daily 10)  Cvs Vitamin D 2000 Unit Tabs (Cholecalciferol) .Marland Kitchen.. 1 Tab By Mouth Once Daily  Allergies: 1)  ! Morphine  Past History:  Past Medical History:  History of apical thrombus coronary artery disease Ischemic cardiomyopathy Prior CVA Hyperlipidemia.    History of asthma with worsening with beta blockade history of GI bleed due to doll fully vessel which was clipped Glaucoma excellent cataracts Nephrolithiasis  Past Surgical History: Reviewed history from 09/18/2008 and no changes required.  status post coronary bypassing graft in 1990 (LIMA to the LAD and saphenous vein graft to the diagonal).   Social History: Reviewed history from 09/18/2008 and no changes required. The patient is married, he lives in [place], he has  three children.  He does not use alcohol or tobacco, he never has.  He is a retired Equities trader.  Review of Systems       Recent insect bite on his left elbow but no fevers or chills, productive cough, hemoptysis, dysphasia, odynophagia, melena, hematochezia, dysuria, hematuria, rash, seizure activity, orthopnea, PND, pedal edema, claudication. Remaining systems are negative.   Vital Signs:  Patient profile:   77 year old male Height:      70 inches Weight:      192 pounds BMI:     27.65 Pulse rate:   74 / minute Resp:     14 per minute BP sitting:   96 / 65  (left arm)  Vitals Entered By: Kem Parkinson (November 01, 2009 9:28 AM)  Physical Exam  General:  Well-developed well-nourished in no acute distress.  Skin is warm and dry.  HEENT is normal.  Neck is supple. No thyromegaly.  Chest is clear to auscultation with normal expansion.  Cardiovascular exam is regular rate and rhythm.  Abdominal exam nontender or distended. No masses palpated. Extremities show no edema. Mild erythema surrounding insect bite on left arm neuro grossly intact    EKG  Procedure date:  11/01/2009  Findings:      Sinus rhythm at a rate of 74. Axis normal. Nonspecific ST changes.  Impression & Recommendations:  Problem # 1:  MURAL THROMBUS, APEX OF HEART (ICD-429.79) Continue Coumadin. Goal INR 2-3.  Problem # 2:  COUMADIN THERAPY (ICD-V58.61) Followed in the Coumadin clinic.  Problem # 3:   HYPERCHOLESTEROLEMIA (ICD-272.0) Continue present medications. Lipids and liver monitored by primary care. His updated medication list for this problem includes:    Pravastatin Sodium 20 Mg Tabs (Pravastatin sodium) .Marland Kitchen... Take one tablet by mouth daily at bedtime    Fenofibrate Micronized 200 Mg Caps (Fenofibrate micronized) .Marland Kitchen... 1 tab by mouth once daily  Problem # 4:  TRANSIENT ISCHEMIC ATTACK (ICD-435.9)  Problem # 5:  ASTHMA (ICD-493.90)  His updated medication list for this problem includes:    Advair Diskus 100-50 Mcg/dose Misc (Fluticasone-salmeterol) ..... Inhale 1 puff by mouth daily  Problem # 6:  CARDIOMYOPATHY (ICD-425.4) Continue ACE inhibitor. Intolerant to beta blockers secondary to worsening asthma. His updated medication list for this problem includes:    Aspirin 81 Mg Tbec (Aspirin) .Marland Kitchen... Take one tablet by mouth daily    Ramipril 2.5 Mg Caps (Ramipril) .Marland Kitchen... Take one capsule by mouth two times a day    Warfarin Sodium 5 Mg Tabs (Warfarin sodium) ..... Use as directed by anticoagulation clinic  Orders: EKG w/ Interpretation (93000)  Problem # 7:  CAD (ICD-414.00) Continue aspirin, ACE inhibitor and statin. Repeat Myoview. His updated medication list for this problem includes:    Aspirin 81 Mg Tbec (Aspirin) .Marland Kitchen... Take one tablet by mouth daily    Ramipril 2.5 Mg Caps (Ramipril) .Marland Kitchen... Take one capsule by mouth two times a day    Warfarin Sodium 5 Mg Tabs (Warfarin sodium) ..... Use as directed by anticoagulation clinic  Orders: Nuclear Stress Test (Nuc Stress Test)  Patient Instructions: 1)  Your physician recommends that you schedule a follow-up appointment in: 12 months 2)  Your physician recommends that you continue on your current medications as directed. Please refer to the Current Medication list given to you today. 3)  Your physician has requested that you have an exercise stress myoview.  For further information please visit [email or web address]  Please  follow instruction sheet, as given.  Appended Document: per walk in/saf / Changed to lexiscan myoview. Change handwritten on instruction sheet given to pt.

## 2010-04-29 NOTE — Medication Information (Signed)
Summary: rov/ewj  Anticoagulant Therapy  Managed by: Cloyde Reams, RN, BSN Referring MD: Olga Millers MD PCP: Oletha Cruel MD: Daleen Squibb MD, Maisie Fus Indication 1: Atrial Fibrillation (ICD-427.31) Lab Used: [facility] Site: [facility] INR POC 2.4 INR RANGE 2 - 3  Dietary changes: no    Health status changes: no    Bleeding/hemorrhagic complications: no    Recent/future hospitalizations: no    Any changes in medication regimen? no    Recent/future dental: no  Any missed doses?: no       Is patient compliant with meds? yes       Allergies: 1)  ! Morphine  Anticoagulation Management History:      The patient is taking warfarin and comes in today for a routine follow up visit.  Positive risk factors for bleeding include an age of 74 years or older, history of CVA/TIA, and history of GI bleeding.  The bleeding index is 'high risk'.  Positive CHADS2 values include Age > 22 years old and Prior Stroke/CVA/TIA.  The start date was 02/18/2004.  Anticoagulation responsible provider: Daleen Squibb MD, Maisie Fus.  INR POC: 2.4.  Cuvette Lot#: [id number].  Exp: 02/2011.    Anticoagulation Management Assessment/Plan:      The patient's current anticoagulation dose is Warfarin sodium 5 mg tabs: Use as directed by Anticoagulation Clinic.  The target INR is 2 - 3.  The next INR is due 02/27/2010.  Anticoagulation instructions were given to patient.  Results were reviewed/authorized by Cloyde Reams, RN, BSN.  He was notified by Cloyde Reams RN.         Prior Anticoagulation Instructions: INR 2.1  Continue on same dosage 1 tablet daily except 1/2 tablet on Tuesdays and Saturdays.  Recheck in 4 weeks.    Current Anticoagulation Instructions: INR 2.4  Continue on same dosage 1 tablet daily except 1/2 tablet on Tuesdays and Saturdays.  Recheck in 4 weeks.

## 2010-04-29 NOTE — Medication Information (Signed)
Summary: rov/ewj  Anticoagulant Therapy  Managed by: Cloyde Reams, RN, BSN Referring MD: Olga Millers MD Supervising MD: Gala Romney MD, Reuel Boom Indication 1: Atrial Fibrillation (ICD-427.31) Lab Used: [facility] Site: [facility] INR POC 1.8 INR RANGE 2 - 3  Dietary changes: no    Health status changes: no    Bleeding/hemorrhagic complications: no    Recent/future hospitalizations: no    Any changes in medication regimen? no    Recent/future dental: no  Any missed doses?: no       Is patient compliant with meds? yes       Allergies: 1)  ! Morphine  Anticoagulation Management History:      The patient is taking warfarin and comes in today for a routine follow up visit.  Positive risk factors for bleeding include an age of 75 years or older, history of CVA/TIA, and history of GI bleeding.  The bleeding index is 'high risk'.  Positive CHADS2 values include Age > 75 years old and Prior Stroke/CVA/TIA.  The start date was 02/18/2004.  Anticoagulation responsible provider: Tymika Grilli MD, Reuel Boom.  INR POC: 1.8.  Cuvette Lot#: [id number].  Exp: 06/2010.    Anticoagulation Management Assessment/Plan:      The patient's current anticoagulation dose is Warfarin sodium 5 mg tabs: Use as directed by Anticoagulation Clinic.  The target INR is 2 - 3.  The next INR is due 05/28/2009.  Anticoagulation instructions were given to patient.  Results were reviewed/authorized by Cloyde Reams, RN, BSN.  He was notified by Cloyde Reams RN.         Prior Anticoagulation Instructions: INR 3.3  Take 1/2 tablet today and then start taking 1 tablet daily except 1/2 tablet on Tuesdays and Saturdays.   Recheck in 3-4.    Current Anticoagulation Instructions: INR 1.8  Start taking 1 tablet daily except 1/2 tablet on Saturdays.  Recheck in 3-4 weeks.

## 2010-04-29 NOTE — Assessment & Plan Note (Signed)
Summary: Cardiology Nuclear Testing  Nuclear Med Background Indications for Stress Test: Evaluation for Ischemia, Graft Patency   History: Asthma, CABG, COPD, Myocardial Perfusion Study, Stents  History Comments: '08 [id number] infarct, no ischemia, EF=40%  Symptoms: DOE, Fatigue, Palpitations, SOB    Nuclear Pre-Procedure Cardiac Risk Factors: Carotid Disease, CVA, Family History - CAD, Hypertension, Lipids Caffeine/Decaff Intake: None NPO After: 6:00 PM Lungs: clear IV 0.9% NS with Angio Cath: 22g     IV Site: R Antecubital IV Started by: Irean Hong, RN Chest Size (in) 44     Height (in): 69 Weight (lb): 189 BMI: 28.01  Nuclear Med Study 1 or 2 day study:  1 day     Stress Test Type:  Eugenie Birks Reading MD:  Dietrich Pates, MD     Referring MD:  Olga Millers MD Resting Radionuclide:  Technetium 58m Tetrofosmin     Resting Radionuclide Dose:  11.0 mCi  Stress Radionuclide:  Technetium 37m Tetrofosmin     Stress Radionuclide Dose:  33.0 mCi   Stress Protocol  Max Systolic BP: 103 mm Hg Lexiscan: 0.4 mg   Stress Test Technologist:  Milana Na, EMT-P     Nuclear Technologist:  Domenic Polite, CNMT  Rest Procedure  Myocardial perfusion imaging was performed at rest 45 minutes following the intravenous administration of Technetium 44m Tetrofosmin.  Stress Procedure  The patient received IV Lexiscan 0.4 mg over 15-seconds.  Technetium 55m Tetrofosmin injected at 30-seconds.  There were no significant changes with infusion.  Quantitative spect images were obtained after a 45 minute delay.  QPS Raw Data Images:  Images were motion corrected.  Soft tissue (diaphragm, bowel activty) underlies heart. Stress Images:  RV is prominent.  Defect in the distal septal, distal anterior, mid/disatal inferior and apical walls. Rest Images:  No signficant change fromthe stress images. Subtraction (SDS):  No evidence of ischemia. Transient Ischemic Dilatation:  1.11  (Normal <1.22)  Lung/Heart Ratio:  .32  (Normal <0.45)  Quantitative Gated Spect Images QGS EDV:  111 ml QGS ESV:  63 ml QGS EF:  43 % QGS cine images:  Hypokinesis/akinesis of thedsital septal, apical walls; hypokinesis of hte distal anterior wall.   Overall Impression  Exercise Capacity: Lexiscan with no exercise. BP Response: Normal blood pressure response. Clinical Symptoms: No chest pain ECG Impression: No significant ST segment change suggestive of ischemia. Overall Impression: Scar in the distal septal, distal anterior, mid/distal inferior and apical walls.  No ischemia.  Appended Document: Cardiology Nuclear Testing ok  Appended Document: Cardiology Nuclear Testing pt aware./cy

## 2010-04-29 NOTE — Medication Information (Signed)
Summary: [name]   Anticoagulant Therapy  Managed by: Reina Fuse, PharmD Referring MD: Olga Millers MD PCP: Oletha Cruel MD: Shirlee Latch MD, Brennan Karam Indication 1: Atrial Fibrillation (ICD-427.31) Lab Used: [facility] Site: [facility] INR POC 3.1 INR RANGE 2 - 3  Dietary changes: yes       Details: May have eaten less leafy greens.  Health status changes: no    Bleeding/hemorrhagic complications: no    Recent/future hospitalizations: no    Any changes in medication regimen? no    Recent/future dental: no  Any missed doses?: no       Is patient compliant with meds? yes       Allergies: 1)  ! Morphine  Anticoagulation Management History:      The patient is taking warfarin and comes in today for a routine follow up visit.  Positive risk factors for bleeding include an age of 75 years or older, history of CVA/TIA, and history of GI bleeding.  The bleeding index is 'high risk'.  Positive CHADS2 values include Age > 48 years old and Prior Stroke/CVA/TIA.  The start date was 02/18/2004.  Anticoagulation responsible provider: Shirlee Latch MD, Nieves Chapa.  INR POC: 3.1.  Cuvette Lot#: [id number].  Exp: 12/2010.    Anticoagulation Management Assessment/Plan:      The patient's current anticoagulation dose is Warfarin sodium 5 mg tabs: Use as directed by Anticoagulation Clinic.  The target INR is 2 - 3.  The next INR is due 12/19/2009.  Anticoagulation instructions were given to patient.  Results were reviewed/authorized by Reina Fuse, PharmD.  He was notified by Reina Fuse PharmD.         Prior Anticoagulation Instructions: INR 2.7  Continue on same dosage 1 tablet daily except 1/2 tablet on Saturdays.  Recheck in 4 weeks.    Current Anticoagulation Instructions: INR 3.1  Today, Wednesday, August 24th, take Coumadin 0.5 tab (2.5 mg). Then, continue taking Coumadin 1 tab (5 mg) all days except Coumadin 0.5 tab (2.5 mg) on Saturdays. Return to clinic in 4 weeks per patient request.

## 2010-04-29 NOTE — Medication Information (Signed)
Summary: rov/ewj  Anticoagulant Therapy  Managed by: Cloyde Reams, RN, BSN Referring MD: Olga Millers MD Supervising MD: Gala Romney MD, Reuel Boom Indication 1: Atrial Fibrillation (ICD-427.31) Lab Used[facility]  Site: [facility] INR POC 2.7 INR RANGE 2 - 3  Dietary changes: no    Health status changes: no    Bleeding/hemorrhagic complications: no    Recent/future hospitalizations: no    Any changes in medication regimen? no    Recent/future dental: no  Any missed doses?: no       Is patient compliant with meds? yes      Comments: Started on radiation for prostate CA on 18th of 40 tx.    Allergies: 1)  ! Morphine  Anticoagulation Management History:      The patient is taking warfarin and comes in today for a routine follow up visit.  Positive risk factors for bleeding include an age of 75 years or older, history of CVA/TIA, and history of GI bleeding.  The bleeding index is 'high risk'.  Positive CHADS2 values include Age > 75 years old and Prior Stroke/CVA/TIA.  The start date was 02/18/2004.  Anticoagulation responsible provider: Shterna Laramee MD, Reuel Boom.  INR POC: 2.7.  Cuvette Lot#: [id number].  Exp: 12/2010.    Anticoagulation Management Assessment/Plan:      The patient's current anticoagulation dose is Warfarin sodium 5 mg tabs: Use as directed by Anticoagulation Clinic.  The target INR is 2 - 3.  The next INR is due 11/21/2009.  Anticoagulation instructions were given to patient.  Results were reviewed/authorized by Cloyde Reams, RN, BSN.  He was notified by Cloyde Reams RN.         Prior Anticoagulation Instructions: INR 3.1  Take 1/2 tablet today, then resume same dosage 1 tablet daily except 1/2 tablet on Saturdays.  Recheck in 3 weeks.    Current Anticoagulation Instructions: INR 2.7  Continue on same dosage 1 tablet daily except 1/2 tablet on Saturdays.  Recheck in 4 weeks.

## 2010-04-30 ENCOUNTER — Encounter: Payer: Self-pay | Admitting: Cardiovascular Disease

## 2010-04-30 ENCOUNTER — Ambulatory Visit: Admit: 2010-04-30 | Payer: Self-pay

## 2010-04-30 ENCOUNTER — Encounter (INDEPENDENT_AMBULATORY_CARE_PROVIDER_SITE_OTHER): Payer: Medicare Other

## 2010-04-30 DIAGNOSIS — Z7901 Long term (current) use of anticoagulants: Secondary | ICD-10-CM

## 2010-04-30 DIAGNOSIS — I4891 Unspecified atrial fibrillation: Secondary | ICD-10-CM

## 2010-04-30 LAB — CONVERTED CEMR LAB: POC INR: 2.1

## 2010-05-01 NOTE — Medication Information (Signed)
Summary: rov/mwb   Anticoagulant Therapy  Managed by: Weston Brass, PharmD Referring MD: Olga Millers MD PCP: Oletha Cruel MD: Patty Sermons, MD Indication 1: Atrial Fibrillation (ICD-427.31) Lab Used: [facility] Site: [facility] INR POC 1.6 INR RANGE 2 - 3  Dietary changes: no    Health status changes: no    Bleeding/hemorrhagic complications: no    Recent/future hospitalizations: no    Any changes in medication regimen? no    Recent/future dental: no  Any missed doses?: yes     Details: Missed day before yesterday dose  Is patient compliant with meds? yes       Allergies: 1)  ! Morphine  Anticoagulation Management History:      The patient is taking warfarin and comes in today for a routine follow up visit.  Positive risk factors for bleeding include an age of 75 years or older, history of CVA/TIA, and history of GI bleeding.  The bleeding index is 'high risk'.  Positive CHADS2 values include Age > 61 years old and Prior Stroke/CVA/TIA.  The start date was 02/18/2004.  His last INR was 1.7.  Anticoagulation responsible provider: Patty Sermons, MD.  INR POC: 1.6.  Exp: 05/2011.    Anticoagulation Management Assessment/Plan:      The patient's current anticoagulation dose is Warfarin sodium 5 mg tabs: Use as directed by Anticoagulation Clinic.  The target INR is 2 - 3.  The next INR is due 04/30/2010.  Anticoagulation instructions were given to patient.  Results were reviewed/authorized by Weston Brass, PharmD.  He was notified by Linward Headland, PharmD candidate.         Prior Anticoagulation Instructions: INR 1.7 Take 1.5 tablet (7.5mg ) today, then go back to normal schedule: Take 0.5 tablet on Tue, Sat.  Take 1 tablet on the rest of the days. Recheck INR in 2 weeks on Jan. 11th  Current Anticoagulation Instructions: INR 1.6 (INR goal:  2-3)  Take 1 and 1/2 tablets today and tomorrow.  Take 1/2 tablet on Tuesdays and Saturdays and 1 tablet on Sundays, Mondays,  Wednesdays, Thursdays, and Fridays.  Recheck in 3 weeks

## 2010-05-01 NOTE — Medication Information (Signed)
Summary: rov/mw   Anticoagulant Therapy  Managed by: Bayard Hugger, PharmD Referring MD: Olga Millers MD PCP: Oletha Cruel MD: Jens Som MD, Arlys John Indication 1: Atrial Fibrillation (ICD-427.31) Lab Used: [facility] Site: [facility] INR RANGE 2 - 3  Dietary changes: no    Health status changes: no    Bleeding/hemorrhagic complications: no    Recent/future hospitalizations: no    Any changes in medication regimen? no    Recent/future dental: no  Any missed doses?: yes     Details: Missed a pill on Sunday  Is patient compliant with meds? yes       Allergies: 1)  ! Morphine  Anticoagulation Management History:      Positive risk factors for bleeding include an age of 75 years or older, history of CVA/TIA, and history of GI bleeding.  The bleeding index is 'high risk'.  Positive CHADS2 values include Age > 75 years old and Prior Stroke/CVA/TIA.  The start date was 02/18/2004.  Today's INR is 1.7.  Anticoagulation responsible provider: Jens Som MD, Arlys John.  Cuvette Lot#: [id number].  Exp: 05/2011.    Anticoagulation Management Assessment/Plan:      The patient's current anticoagulation dose is Warfarin sodium 5 mg tabs: Use as directed by Anticoagulation Clinic.  The target INR is 2 - 3.  The next INR is due 04/09/2010.  Anticoagulation instructions were given to patient.  Results were reviewed/authorized by Bayard Hugger, PharmD.  He was notified by Bayard Hugger PharmD.         Prior Anticoagulation Instructions: INR 2.2  Continue taking a half tablet on tuesday and saturday. And 1 tablet all other days. Recheck in 4 weeks.   Current Anticoagulation Instructions: INR 1.7 Take 1.5 tablet (7.5mg ) today, then go back to normal schedule: Take 0.5 tablet on Tue, Sat.  Take 1 tablet on the rest of the days. Recheck INR in 2 weeks on Jan. 11th

## 2010-05-07 NOTE — Medication Information (Signed)
Summary: rov/ewj  Anticoagulant Therapy  Managed by: Bethena Midget, RN, BSN Referring MD: Olga Millers MD PCP: Oletha Cruel MD: Eden Emms MD, Theron Arista Indication 1: Atrial Fibrillation (ICD-427.31) Lab Used: [facility] Site: [facility] INR POC 2.1 INR RANGE 2 - 3  Dietary changes: no    Health status changes: no    Bleeding/hemorrhagic complications: no    Recent/future hospitalizations: no    Any changes in medication regimen? no    Recent/future dental: no  Any missed doses?: no       Is patient compliant with meds? yes       Allergies: 1)  ! Morphine  Anticoagulation Management History:      The patient is taking warfarin and comes in today for a routine follow up visit.  Positive risk factors for bleeding include an age of 75 years or older, history of CVA/TIA, and history of GI bleeding.  The bleeding index is 'high risk'.  Positive CHADS2 values include Age > 69 years old and Prior Stroke/CVA/TIA.  The start date was 02/18/2004.  His last INR was 1.7.  Anticoagulation responsible provider: Eden Emms MD, Theron Arista.  INR POC: 2.1.  Cuvette Lot#: [id number].  Exp: 03/2011.    Anticoagulation Management Assessment/Plan:      The patient's current anticoagulation dose is Warfarin sodium 5 mg tabs: Use as directed by Anticoagulation Clinic.  The target INR is 2 - 3.  The next INR is due 05/28/2010.  Anticoagulation instructions were given to patient.  Results were reviewed/authorized by Bethena Midget, RN, BSN.  He was notified by Bethena Midget, RN, BSN.         Prior Anticoagulation Instructions: INR 1.6 (INR goal:  2-3)  Take 1 and 1/2 tablets today and tomorrow.  Take 1/2 tablet on Tuesdays and Saturdays and 1 tablet on Sundays, Mondays, Wednesdays, Thursdays, and Fridays.  Recheck in 3 weeks  Current Anticoagulation Instructions: INR 2.1 Continue 5mg s everyday except 2.5mg s on Tuesday and Saturdays. Recheck in 4 weeks.

## 2010-05-19 DIAGNOSIS — I4891 Unspecified atrial fibrillation: Secondary | ICD-10-CM

## 2010-05-19 DIAGNOSIS — I480 Paroxysmal atrial fibrillation: Secondary | ICD-10-CM | POA: Insufficient documentation

## 2010-05-19 DIAGNOSIS — G459 Transient cerebral ischemic attack, unspecified: Secondary | ICD-10-CM

## 2010-05-19 DIAGNOSIS — I238 Other current complications following acute myocardial infarction: Secondary | ICD-10-CM

## 2010-05-19 DIAGNOSIS — I6350 Cerebral infarction due to unspecified occlusion or stenosis of unspecified cerebral artery: Secondary | ICD-10-CM

## 2010-05-28 ENCOUNTER — Encounter (INDEPENDENT_AMBULATORY_CARE_PROVIDER_SITE_OTHER): Payer: Medicare Other

## 2010-05-28 ENCOUNTER — Encounter: Payer: Self-pay | Admitting: Cardiology

## 2010-05-28 DIAGNOSIS — I4891 Unspecified atrial fibrillation: Secondary | ICD-10-CM

## 2010-05-28 DIAGNOSIS — Z7901 Long term (current) use of anticoagulants: Secondary | ICD-10-CM

## 2010-05-28 LAB — CONVERTED CEMR LAB: POC INR: 2.3

## 2010-06-05 NOTE — Medication Information (Signed)
Summary: rov/tm   Anticoagulant Therapy  Managed by: Weston Brass, PharmD Referring MD: Olga Millers MD PCP: Oletha Cruel MD: Shirlee Latch MD, Freida Busman Indication 1: Atrial Fibrillation (ICD-427.31) Lab Used: [facility] Site: [facility] INR POC 2.3 INR RANGE 2 - 3  Dietary changes: no    Health status changes: no    Bleeding/hemorrhagic complications: no    Recent/future hospitalizations: no    Any changes in medication regimen? no    Recent/future dental: no  Any missed doses?: no       Is patient compliant with meds? yes       Allergies: 1)  ! Morphine  Anticoagulation Management History:      The patient is taking warfarin and comes in today for a routine follow up visit.  Positive risk factors for bleeding include an age of 75 years or older, history of CVA/TIA, and history of GI bleeding.  The bleeding index is 'high risk'.  Positive CHADS2 values include Age > 75 years old and Prior Stroke/CVA/TIA.  The start date was 02/18/2004.  His last INR was 1.7.  Anticoagulation responsible provider: Shirlee Latch MD, Larysa Pall.  INR POC: 2.3.  Cuvette Lot#: [id number].  Exp: 03/2011.    Anticoagulation Management Assessment/Plan:      The patient's current anticoagulation dose is Warfarin sodium 5 mg tabs: Use as directed by Anticoagulation Clinic.  The target INR is 2 - 3.  The next INR is due 06/25/2010.  Anticoagulation instructions were given to patient.  Results were reviewed/authorized by Weston Brass, PharmD.  He was notified by Margot Chimes PharmD Candidate.         Prior Anticoagulation Instructions: INR 2.1 Continue 5mg s everyday except 2.5mg s on Tuesday and Saturdays. Recheck in 4 weeks.   Current Anticoagulation Instructions: INR 2.3   Continue to take 1 tablet daily except on Tuesdays and Saturdays when you only take 1/2 tablet.  Recheck INR in 4 weeks.

## 2010-06-25 ENCOUNTER — Ambulatory Visit (INDEPENDENT_AMBULATORY_CARE_PROVIDER_SITE_OTHER): Payer: Medicare Other | Admitting: *Deleted

## 2010-06-25 DIAGNOSIS — I4891 Unspecified atrial fibrillation: Secondary | ICD-10-CM

## 2010-06-25 DIAGNOSIS — G459 Transient cerebral ischemic attack, unspecified: Secondary | ICD-10-CM

## 2010-06-25 DIAGNOSIS — I6350 Cerebral infarction due to unspecified occlusion or stenosis of unspecified cerebral artery: Secondary | ICD-10-CM

## 2010-06-25 DIAGNOSIS — I238 Other current complications following acute myocardial infarction: Secondary | ICD-10-CM

## 2010-06-25 LAB — POCT INR: INR: 2.2

## 2010-06-25 NOTE — Patient Instructions (Signed)
INR 2.2  Continue same dose of 1 tablet every day except 1/2 tablet on Tuesday and Saturday.  Recheck INR in 4 weeks.

## 2010-07-23 ENCOUNTER — Ambulatory Visit (INDEPENDENT_AMBULATORY_CARE_PROVIDER_SITE_OTHER): Payer: Medicare Other | Admitting: *Deleted

## 2010-07-23 DIAGNOSIS — I238 Other current complications following acute myocardial infarction: Secondary | ICD-10-CM

## 2010-07-23 DIAGNOSIS — I4891 Unspecified atrial fibrillation: Secondary | ICD-10-CM

## 2010-07-23 DIAGNOSIS — G459 Transient cerebral ischemic attack, unspecified: Secondary | ICD-10-CM

## 2010-07-23 DIAGNOSIS — I6350 Cerebral infarction due to unspecified occlusion or stenosis of unspecified cerebral artery: Secondary | ICD-10-CM

## 2010-07-23 LAB — POCT INR: INR: 2.7

## 2010-08-12 NOTE — Assessment & Plan Note (Signed)
[facility]                            CARDIOLOGY OFFICE NOTE   DARRIUS, MONTANO                      MRN:          [id number]  DATE:08/19/2007                            DOB:          1926/06/29    The patient is a very pleasant 81-year gentleman who has a history of  coronary artery disease status post coronary bypassing graft in 1990.  His most recent Myoview was performed on November 11, 2006.  The patient's  ejection fraction was 40%.  There was a previous apical infarct, but  there was no ischemia noted.  He also has a history of an apical  thrombus as well as a prior CVA felt possibly secondary embolic in  nature.  Since I last saw him, he is doing well with no dyspnea, chest  pain, palpitations or syncope.  There is no pedal edema.   MEDICATIONS AT PRESENT:  1. Pravachol 20 mg p.o. daily.  2. Accolate 20 mg p.o. b.i.d.  3. Coumadin as directed,  4. Tricor 145 mg p.o. daily.  5. Uroxatral 10 mg p.o. daily.  6. Multivitamin.  7. Eye drops.  8. Altace 2.5 mg p.o. daily.  9. Advair 250/50 b.i.d.  10.He also takes enteric-coated aspirin 81 mg p.o. daily.   PHYSICAL EXAM TODAY:  Blood pressure of 101/65, and his pulse is 73.  He  weighs 201 pounds.  HEENT:  Normal.  NECK:  Supple.  No bruits.  CHEST:  Clear.  CARDIOVASCULAR:  Regular rate.  ABDOMEN:  No tenderness.  EXTREMITIES:  No edema.   Electrocardiogram shows a sinus rhythm at a rate of 69.  There are no  significant ST changes.  A prior anterior infarct cannot be excluded.   DIAGNOSES:  1. Coronary artery disease status post coronary bypassing graft - Mr.      Kandel was doing well with no chest pain or shortness of breath.      His recent Myoview showed infarct but no ischemia.  We will      continue with medical therapy including his aspirin, statin and ACE      inhibitor.  He has not tolerated beta blockade in the past due to      asthma.  2. Ischemic cardiomyopathy - as  per #1.  3. History of apical thrombus - given his history of an apical      thrombus and prior cerebrovascular accident, I think he will most      likely require lifelong Coumadin.  We will continue with goal INR      of 2-3, and this is being monitored in our Coumadin Clinic.  Dr.      Wylene Simmer checked his hemoglobin recently, and it was normal.  4. Prior cerebrovascular accident.  5. Hyperlipidemia - he will continue on his present dose of Pravachol,      and his recent lipids and liver were outstanding.  6. History of asthma with beta blockade.   He will continue with his risk factor modification including diet and  exercise.  He does not smoke.  I will see him  back in 12 months.     Madolyn Frieze Jens Som, MD, [facility]  Electronically Signed    BSC/MedQ  DD: 08/19/2007  DT: 08/19/2007  Job #: [id number]   cc:   Gaspar Garbe, M.D.

## 2010-08-12 NOTE — Assessment & Plan Note (Signed)
[facility]                            CARDIOLOGY OFFICE NOTE   AMILLION, MACCHIA                      MRN:          [id number]  DATE:11/03/2006                            DOB:          1926-11-05    [name] returns for followup today.  He has a history of coronary  artery disease, status post coronary artery bypass graft in 1990.  He  also has a history of an apical thrombus.  He has also had a previous  CVA, felt embolic in nature.  Since I last saw him, he is doing  extremely well.  There is no dyspnea, chest pain, palpitations, or  syncope.  There is no pedal edema.  He is contemplating cataract  surgery.   His medications include Pravachol 20 mg p.o. daily, Accolate 20 mg p.o.  b.i.d., Plavix 75 mg p.o. daily, Coumadin as directed, Tricor 145 mg  p.o. daily, Uroxetral 10 mg p.o. daily, multivitamin, eye drops, Altace  2.5 mg p.o. b.i.d., and Advair.   PHYSICAL EXAMINATION:  Blood pressure 100/60.  His pulse is 66.  HEENT:  Normal.  Neck is supple with no bruits.  CHEST:  Clear.  CARDIOVASCULAR:  Regular rate and rhythm.  ABDOMEN:  Benign.  EXTREMITIES:  No edema.   His electrocardiogram shows a sinus rhythm at a rate of 66.  There are  nonspecific ST changes.   DIAGNOSES:  1. Coronary artery disease, status post coronary artery bypass graft:      It has been three years since his previous stress test, and he is      contemplating beginning an exercise program.  We will risk stratify      with a stress Myoview.  If it shows normal perfusion, we will      continue with medical therapy, including his statin and ACE      inhibitor.  Note, he has not tolerated beta blockade in the past      due to asthma.  I will discontinue his Plavix, and we will begin      enteric-coated aspirin 81 mg p.o. daily.  2. Ischemic cardiomyopathy:  As per #1.  3. History of apical thrombus:  He will continue on his Coumadin with      a goal INR of 2-3.   I would be hesitant to discontinue this for any      surgery without bridging the patient due to his history of an      embolic cerebrovascular accident.  4. Prior cerebrovascular accident.  5. Hyperlipidemia:  His most lipids and liver were outstanding.  We      will continue with his present dose of statin.  6. History of asthma with beta blockade.   We will see the patient back in approximately nine months.     Madolyn Frieze Jens Som, MD, [facility]  Electronically Signed    BSC/MedQ  DD: 11/03/2006  DT: 11/03/2006  Job #: [id number]   cc:   Gaspar Garbe, M.D.

## 2010-08-14 ENCOUNTER — Other Ambulatory Visit: Payer: Self-pay | Admitting: Cardiology

## 2010-08-15 NOTE — Assessment & Plan Note (Signed)
[facility]                              CARDIOLOGY OFFICE NOTE   ARISTOTELIS, VILARDI                      MRN:          [id number]  DATE:02/01/2006                            DOB:          03/07/1927    [name] returns for follow-up today.  He has history of coronary artery  bypass grafting and ischemic cardiomyopathy.   There is history of an apical thrombus on his previous echocardiograms.  He  apparently was recently having eye surgery and suffered a CVA.  A CT  apparently showed no acute abnormality.  An MRI also showed no acute  abnormality.  The MRA showed irregularity of the distal inferior and PCA  branches suggestive of intracranial atherosclerosis but there was no  proximal stenosis.  Carotid Dopplers were negative.  A 2-D echocardiogram  showed an ejection fraction of 40-45% and an apical thrombus could not be  excluded.  Since that time, he has done reasonably well.  He still has a  visual field defect.  He has not had chest pain or shortness of breath.   CURRENT MEDICATIONS:  1. Pravachol 20 mg p.o. daily.  2. Accolate 20 mg p.o. b.i.d.  3. Plavix 75 mg p.o. daily.  4. Coumadin as directed.  5. Tricor 145 mg p.o. daily.  6. UroXatral 10 mg p.o. daily.  7. Multivitamin.  8. Eye drops.  9. Altace 2.5 mg p.o. t.i.d.  10.Advair 250/50 one p.o. b.i.d.   PHYSICAL EXAMINATION:  VITAL SIGNS:  She has a blood pressure of 98/64,  pulse is 67.  NECK:  Supple.  I cannot appreciate bruits.  CHEST:  Clear.  CARDIOVASCULAR:  Regular rate and rhythm.  EXTREMITIES:  No edema.   His electrocardiogram shows a sinus rhythm at a rate of 67.  There are  nonspecific anterior T-wave changes, unchanged from previous.   DIAGNOSES:  1. Coronary artery disease status post coronary artery bypass grafting.  2. Ischemic cardiomyopathy.  3. History of apical thrombus.  4. Recent cerebrovascular accident.  5. Hyperlipidemia.  6. History of asthma  with beta-blockade.  7. Hyperlipidemia.   PLAN:  Mr. [name] is doing well from a symptomatic standpoint with no chest  pain or shortness of breath.  We have tried beta-blockers in the past but it  has worsened his asthma.  We will therefore not reattempt this.  We will  continue his present medications.  His most recent lipids were outstanding  and he is scheduled to see Dr. Wylene Simmer in one to two weeks.  We will have  his labs forwarded to [place] for our records.  Our goal LDL should be less than  70 given his history of coronary disease.  He will continue on Coumadin with  a goal INR of 2-3.  I would be hesitant to discontinue this medication given  his recent CVA and history of apical thrombus.  I will see him back in 9  months.    ______________________________  Madolyn Frieze. Jens Som, MD, [facility]    BSC/MedQ  DD: 02/01/2006  DT: 02/01/2006  Job #: [id number]  cc:   Gaspar Garbe, M.D.

## 2010-08-15 NOTE — Discharge Summary (Signed)
NAME:  Donald [name], Donald [name]                         ACCOUNT NO.:  [id number]   MEDICAL RECORD NO.:  [id number]                   PATIENT TYPE:  OIB   LOCATION:  6523                                 FACILITY:  [facility]   PHYSICIAN:  Tereso Newcomer, P.A.                  DATE OF BIRTH:  Oct 15, 1926   DATE OF ADMISSION:  01/25/2003  DATE OF DISCHARGE:                                 DISCHARGE SUMMARY   DISCHARGE DIAGNOSES:  1. Coronary artery disease.     a. Status post coronary artery bypass graft.     b. Abnormal Cardiolite revealing transient cavity dilatation, apical        infarct with surrounding ischemia involving the distal anterior and        inferolateral segments and an ejection fraction of 35%.     c. Status post Taxus stent to the left main and circumflex on January 25, 2003.     d. Ejection fraction 35-40% at catheterization on January 25, 2003.  2. Treated dyslipidemia.  3. History of asthma.   PROCEDURES PERFORMED THIS ADMISSION:  Cardiac catheterization and  percutaneous coronary intervention by Charlies Constable, M.D., on January 25, 2003.  Angiography revealed left main 40%; LAD totalled; circumflex 80%  ostial, 50% distal; RCA 30% proximal; left ventriculogram with apical  akinesis to dyskinesia and an EF of 35-40%.  Status post Taxus stent  placement to the left main/circumflex reducing 80% stenosis to less than  10%.   HOSPITAL COURSE:  Please see the office note from Olga Millers, M.D., on  January 22, 2003.  Briefly, this 77 year old male was seen back in the  office with complaints of increasing chest discomfort.  He had had a recent  Cardiolite that was abnormal revealing transient cavity dilatation,  decreased systolic blood pressure, and worsening ischemia; therefore, he was  set up for cardiac catheterization to further evaluate his abnormal  Cardiolite.  This was performed on January 25, 2003, by Dr. Juanda Chance as noted  above.  He underwent percutaneous  coronary intervention as noted above.  He  tolerated the procedure well and had no immediate complications.  Dr. Juanda Chance  saw the patient on January 26, 2003.  His groin was stable and he felt the  patient was ready for discharge to home.  He noted that the patient will  need to remain on Plavix for six to 12 months.  The patient was enrolled in  the STEEPLE trial.  The research staff will be in touch with the patient to  schedule follow-up.   DISCHARGE LABORATORY DATA:  White count 8200, hemoglobin 14.5, hematocrit  42.8, platelet count 236,000.  Post-procedure troponin I 0.04.   DISCHARGE MEDICATIONS:  1. Plavix 75 mg daily for the next six to 12 months.  2. Aspirin 325 mg daily.  3. Accolate 20 mg b.i.d.  4. Pravachol 20  mg q.h.s.  5. Advair b.i.d.  6. Altace 10 mg daily.  7. Coreg 3.125 mg twice daily.  8. Multivitamin.  9. Albuterol p.r.n.  10.      Nitroglycerin p.r.n. chest pain.   PAIN MANAGEMENT:  The patient is to use Tylenol as needed.  Nitroglycerin as  needed for chest pain.  He is to call our office or 911 for recurrent chest  pain.   ACTIVITY:  No driving or heavy lifting, exertion, work for one week.   DIET:  Low fat, low sodium.   WOUND CARE:  The patient is to call our office for any groin swelling,  bleeding, or bruising.   SPECIAL INSTRUCTIONS:  The patient has been enrolled in the STEEPLE trial.  He will be contacted for 30-day follow-up with the [employer] staff.   FOLLOW-UP:  The patient needs to follow up with Dr. Jens Som on November 15  at 10:15 a.m.  He will need to follow up with Gaspar Garbe, M.D., as  needed.                                                 Tereso Newcomer, P.A.    SW/MEDQ  D:  01/26/2003  T:  01/26/2003  Job:  [id number]   cc:   Olga Millers, M.D.   Gaspar Garbe, M.D.  [address]  [place]  [place] [address]  Fax: [phone]

## 2010-08-15 NOTE — H&P (Signed)
NAME:  Donald [name], HAUK NO.:  [id number]   MEDICAL RECORD NO.:  [id number]         PATIENT TYPE:  INP   LOCATION:                               FACILITY:  [facility]   PHYSICIAN:  Jesse Sans. Wall, M.D.   DATE OF BIRTH:  Sep 17, 1926   DATE OF ADMISSION:  DATE OF DISCHARGE:                                HISTORY & PHYSICAL   CHIEF COMPLAINT:  Hypotension with guaiac-positive stools.   HISTORY OF PRESENT ILLNESS:  This is a pleasant 77 year old male followed in  our practice by Dr. Olga Millers.  His primary care physician is Dr.  Wylene Simmer.  His gastroenterologist is Dr. Roosvelt Harps.  The patient has a  history of coronary artery disease with coronary artery bypass graft surgery  in 1989, as well as a circumflex stent performed in 2004.  Recently, he had  a workup including carotid Dopplers, an adenosine Myoview, and 2-D  echocardiogram.  The adenosine Myoview showed no ischemia, EF 38%.  Carotid  Dopplers showed no significant stenosis.  The echocardiogram showed an  ejection fraction of 55% but there was a nonmobile clot in the left  ventricle.  The patient was placed on Coumadin therapy.  Since that time he  has become very weak, dizzy, and short of breath.  He checked his blood  pressure recently, found his blood pressure to be low.  He stopped taking  his Altace.  He presents to the office today for further evaluation.  On  physical exam, the patient appears to be very pale.  He was found to have  guaiac-positive stool.  A CBC, protime, and basic metabolic panel are  pending.   The patient had recently been out of town.  He had a protime performed at  another clinic.  The results were to be called to our clinic; however, we  never received the results.  The patient is now being admitted for further  evaluation.   PAST MEDICAL HISTORY:  As noted, the patient has a history of coronary  artery bypass graft surgery in 1989.  He had a PTCA stent to the circumflex  in 2004.  He has hyperlipidemia.  He has asthma with bronchospasm.  He has a  history of an apical aneurysm with clot.  He has a history of ischemic  cardiomyopathy with an EF of 35% in the past, recently was 38% by an  adenosine Myoview and 55% by an echocardiogram on February 14, 2004 which  did reveal a nonmobile clot in the left ventricle.  He has an essential  tremor.  He has had recent hypotension, increased heart rate, and dyspnea.   ALLERGIES:  No known drug allergies.   CURRENT MEDICATIONS:  1.  Enteric-coated aspirin 325 mg daily.  2.  Altace 10 mg daily.  3.  Multivitamin daily.  4.  Pravachol 20 mg daily.  5.  Accolate 20 mg b.i.d.  6.  Plavix 75 mg daily.  7.  Foradil 12 mcg daily.  8.  Tricor 145 mg daily.  9.  Uroxatral 10 mg daily.   The patient recently stopped  taking his Altace.  He is also on Coumadin,  dosage not available.   SOCIAL HISTORY:  The patient is married, he lives in [place], he has  three children.  He does not use alcohol or tobacco, he never has.  He is a  retired Equities trader.   FAMILY HISTORY:  His father died at age 45 from an MI.  His mother died at  age 9 from cancer and heart trouble.  He has a sister who has heart  trouble.   REVIEW OF SYSTEMS:  Negative except for the following:  He has had recent  dizziness, shortness of breath, easy bruising, weakness, fatigue, asthma  symptoms but no cough.  He has had problems with urine flow.   PHYSICAL EXAMINATION:  GENERAL:  Reveals a pleasant 77 year old well-  developed, well-nourished white male in no acute distress although he does  appear very pale.  VITAL SIGNS:  Blood pressure 120/78, pulse 89.  Orthostatic pressures were  checked.  Lying he was 99/67 with a pulse of 86, standing 92/59 with a pulse  of 118, after 3 minutes 97/52 with a pulse of 129.  HEENT:  Unremarkable except for very pale conjunctivae.  NECK:  Reveals no bruits, no jugular venous distention.  HEART:   Reveals regular rate and rhythm without murmur.  LUNGS:  Clear but slightly decreased.  ABDOMEN:  Obese, soft, nontender.  EXTREMITIES:  Reveal no significant edema.  Pulses are intact.  SKIN:  Warm and dry.  RECTAL:  Revealed strongly guaiac-positive stool.   LABORATORY DATA:  Labs are pending at this time.  He had an EKG in the  office today that showed normal sinus rhythm with occasional premature  ectopic complexes, nonspecific T wave abnormalities, rate 89 beats per  minute at rest.  Sitting up, his rate increased to 110 and it almost  appeared to be an atrial flutter pattern.  However, this was reviewed by Dr.  Daleen Squibb and was felt to be due to the patient's tremor.   IMPRESSION:  1.  Gastrointestinal bleeding.  2.  Recent Coumadin therapy secondary to left ventricular clot on      echocardiogram.  3.  Recent dyspnea on exertion, hypotension, dizziness, and increased heart      rate.  4.  Recent carotid Dopplers February 14, 2004 showed no significant      stenosis.  5.  Adenosine Myoview February 14, 2004 showing no ischemia, ejection      fraction 38%.  6.  Echocardiogram February 14, 2004 showing ejection fraction 55% with a      nonmobile clot in the left ventricle.  7.  Recent initiation of Coumadin therapy secondary to the abnormal      echocardiogram.  8.  History of ischemic cardiomyopathy in the past.  9.  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  10. History of bronchospasm and asthma.  11. History of coronary artery bypass graft surgery in 1989.  12. History of circumflex stent in 2004.  13. History of hyperlipidemia.   The plan will be to admit the patient to [facility].  He has been  seen by myself as well as Dr. Daleen Squibb.  We have contacted Dr. Joanette Gula  office for a GI consult.  We will hold his aspirin, his Plavix, and his  Coumadin.  We will also hold his Altace antihypertensive medication.  We will type and cross 2 units of packed red blood  cells for possible  transfusion.  We will hydrate him carefully with normal saline.      Markus.Osmond   DR/MEDQ  D:  02/27/2004  T:  02/27/2004  Job:  [id number]   cc:   Gaspar Garbe, M.D.  [address]  [place]  [place] [address]  Fax: [phone]   Althea Grimmer. Luther Parody, M.D.  [address]  [place]  [place] [address]  Fax: [phone]

## 2010-08-15 NOTE — Consult Note (Signed)
NAME:  Donald [name], Donald [name] NO.:  [id number]   MEDICAL RECORD NO.:  [id number]          PATIENT TYPE:  OBV   LOCATION:  4706                         FACILITY:  [facility]   PHYSICIAN:  Deanna Artis. Hickling, M.D.DATE OF BIRTH:  10/08/1926   DATE OF CONSULTATION:  12/30/2005  DATE OF DISCHARGE:                                   CONSULTATION   CHIEF COMPLAINT:  Visual disturbance.   HISTORY OF THE PRESENT CONDITION:  Donald [name] is a 77 year old gentleman  who has had problems with the vision in his left eye.  He was noted to have  an epiretinal membrane with macular distortion and macular edema.   Today he had a closed vitrectomy with membrane peel and endolaser therapy.   The procedure went well.  However, in the postoperative period, the  patient's blood pressure (by his history) dropped 70/40.  There was no blood  pressure noted lower than 85/48 in the postanesthesia care unit record.   The patient was being moved and was sitting up.  He felt dizzy beforehand.  As he sat up, he complained of blurred vision in his eye.  He was examined  by Dr. Luciana Axe who noted full motility of his eyes, reactive pupil, and  evidence of right field cut.   The patient was sent down for a CT scan of the brain, which I have reviewed.  This shows a possible aneurysm of 6.5 mm adjacent to the right vertebral  artery.  This is stable from prior CT scan.  No acute strokes are seen.  No  hemorrhage was seen.   This occurred around 4 p.m.  Around 4:10, his visual symptoms were improved.  I was contacted for code stroke at 1632.  I called to find that the  patient's symptoms had completely resolved.  This was confirmed by Dr.  Luciana Axe after he assessed the patient.   I was asked to see him to evaluate the patient for a left brain TIA.   The patient has had a prior left brain TIA that left him with half an hour  of right hemiparesis about 18 months ago.  He has risk factors for stroke  including a immobile clot in his left ventricle.  Ejection fractions have  been as low as 38% and as high as 55%.  The patient was placed on Coumadin  because of the immobile clot.  He had a circumflex stent placed in 2004 and  is on Plavix.  He had coronary bypass graft surgery in 1989.   The patient was hospitalized in 2005 with gastrointestinal bleed in the  fundus of his stomach.  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   His risk factors for stroke include the immobile clot, cardiomyopathy,  coronary artery disease, apical hypokinesis, akinesis, and dyslipidemia.  Other medical problems include asthma that is exacerbated by beta blockers  and coronary artery disease that did not require further intervention in  October of 2004.   Fortunately, the patient's GI bleed was able to be addressed and he was not  off Coumadin and Plavix for a  long time.   The patient has not had any other significant medical problems I am aware  of.  He has had no other surgeries.   CURRENT MEDICATIONS:  His current medications include:  1. Accolade 20 mg daily.  2. Altace 2.5 mg daily.  3. Plavix 75 mg daily.  4. Pravachol 20 mg daily.  5. Tricor 145 mg daily.  6. UroXatral 10 mg daily.  7. Coumadin 5 mg daily.  8. Advair discus as needed.  9. He is on a variety of other eye medicines including Xalatan OD every      bedtime, Xibrom OS twice daily, Nevanac OS three times daily, Vigamox      OS three times a day.   ALLERGIES:  DRUG ALLERGIES INCLUDE CODEINE WHICH IS REALLY AN INTOLERANCE  THAT CAUSES HIM TO BE QUITE NAUSEATED.   FAMILY HISTORY:  Father died at age 33 of myocardial infarction.  Mother  died a year later at age 40 from stomach cancer and heart disease.   SOCIAL HISTORY:  The patient worked at 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  The patient has been retired for  quite  some time and has volunteered in the [employer].   He is married.  He has three children.  He does not use alcohol or tobacco  and never has.   REVIEW OF SYSTEMS:  Twelve system review is in the chart and is negative  except as noted above.   PHYSICAL EXAMINATION:  GENERAL:  On examination today, this is a well-  developed, well-nourished gentleman with his left eye patched, in no acute  distress.  VITAL SIGNS:  Blood pressure 96/63, resting pulse 58, respirations 20,  temperature 97.7, oxygen saturation 95%.  HEENT:  No bruits, meningismus, or infection.  LUNGS:  Clear.  HEART:  No murmurs.  Pulses normal.  Regular sinus rhythm.  ABDOMEN:  Protuberant.  Bowel sounds normal.  No hepatosplenomegaly.  EXTREMITIES:  Unremarkable.  NEUROLOGIC:  The patient is awake, alert without dysphasia.  Cranial nerves:  Right pupil is 4 mm and reacts, fundus shows some scarring on the right  side, sharp disk margins, visual acuity 20/30; left eye is patched;  symmetric facial strength; midline tongue; air conduction greater than bone  conduction.  Motor examination:  Excellent strength, no drift, fine motor  movements normal.  Sensory examination intact to stereognosis, vibration,  proprioception and primary nociceptive sensation.  Cerebellar finger-to-  nose, rapid repetitive movements were fine, heel-knee-shin was okay.  Gait  was not tested.  Deep due to reflexes were diminished.  The patient had  bilateral flexor plantar responses.   IMPRESSION:  Transient ischemic attack, left brain.  435.8.  I believe this  is related to hypotension and decreased perfusion as opposed to an embolic  event.  I cannot rule out an embolic event given his heart disease.   PLAN:  The patient needs to have an MRI scan of the brain, MRA intracranial  2-D echocardiogram, carotid Doppler, transcranial Doppler, and labs including hemoglobin A1C, serum homocystine, and serum lipid panel.  No   change in Coumadin or Plavix this time.  I appreciate the opportunity to  participate in his care.  We are going to try  to facilitate his studies so that he can get out of the hospital.  His wife  has a hemiparesis from  hemorrhage and his daughter is also disabled.  He  recognizes the importance of maintaining his health so that he can continue  to be a caregiver to his daughter and wife.  He has been married for 57  years.      Deanna Artis. Sharene Skeans, M.D.  Electronically Signed     WHH/MEDQ  D:  12/30/2005  T:  12/31/2005  Job:  [id number]   cc:   Jillyn Hidden A. Rankin, M.D.  Gaspar Garbe, M.D.  Althea Grimmer. Luther Parody, M.D.  Madolyn Frieze Jens Som, MD, [facility]

## 2010-08-15 NOTE — Op Note (Signed)
NAME:  KALA, GASSMANN NO.:  [id number]   MEDICAL RECORD NO.:  [id number]          PATIENT TYPE:  AMB   LOCATION:  SDS                          FACILITY:  [facility]   PHYSICIAN:  Alford Highland. Rankin, M.D.   DATE OF BIRTH:  January 02, 1927   DATE OF PROCEDURE:  12/30/2005  DATE OF DISCHARGE:                                 OPERATIVE REPORT   PREOPERATIVE DIAGNOSIS:  Epiretinal membrane, left eye.   POSTOPERATIVE DIAGNOSIS:  Epiretinal membrane, left eye.   PROCEDURE:  Posterior vitrectomy, membrane peel, internal limiting membrane,  left eye, 25 gauge.   SURGEON:  Alford Highland. Rankin, M.D.   ANESTHESIA:  Local retrobulbar with monitored anesthesia care.   INDICATIONS FOR PROCEDURE:  The patient is a 77 year old man, who has  significant impairment of visual acuity in the left eye function on the  basis of epiretinal membrane, topographic distortion.  The patient  understands this is an attempt to release the traction so as to allow for  enhanced visual functioning and visual acuity improvement.  The patient  understands the risks of anesthesia, including neurovascular injury, death,  loss of the eye, and including but not limited to hemorrhage, infection,  scarring, need for further surgery, no change in vision, loss of vision,  progression of disease despite intervention.  Appropriate signed consent was  obtained and the patient was taken to the operating room.   DESCRIPTION OF PROCEDURE:  In the operating room, appropriate monitoring was  followed by mild sedation.  Marcaine 0.75% was delivered, 5 cc retrobulbar,  followed by additional 5 cc, the latter in the fashion of a modified [employer].  The left periocular region was sterilely prepped and draped in the  usual ophthalmic fashion.  A lid speculum was applied.  Trocars were then  used, 25-gauge trocars used to place the infusion cannula.  The infusion was  then turned on.  The superior trocars were applied.  A 25-gauge  core  vitrectomy was then begun.  Vitreous skirt trimmed 360 degrees.  A 25-gauge  forceps were then used to engage the epiretinal membrane, which apparently  was mostly internal limiting membrane in nature.  This was then removed as a  continuous sheet off the foveal macular region.  No complications occurred.  The peripheral retina was inspected and found to be free of retinal holes or  tears.  At this time, the trocars were removed from the eye under high  pressure, and the wounds were self-sealing.  The infusion was removed.  Subconjunctival injection of Decadron applied.  A sterile patch and a Fox  shield were applied.  The patient tolerated the procedure well without  complication, taken to the PACU in good, to be discharged to home for  followup as an outpatient.      Alford Highland Rankin, M.D.  Electronically Signed     GAR/MEDQ  D:  12/30/2005  T:  12/30/2005  Job:  [id number]

## 2010-08-15 NOTE — Discharge Summary (Signed)
NAME:  Donald [name], Donald [name] NO.:  [id number]   MEDICAL RECORD NO.:  [id number]          PATIENT TYPE:  OBV   LOCATION:  4706                         FACILITY:  [facility]   PHYSICIAN:  Deanna Artis. Hickling, M.D.DATE OF BIRTH:  05/07/1926   DATE OF ADMISSION:  12/30/2005  DATE OF DISCHARGE:  12/31/2005                                 DISCHARGE SUMMARY   DIAGNOSIS AT TIME OF DISCHARGE:  1. Left brain transient ischemic attack.  2. Epiretinal membrane with macular distortion and macular edema, status      post closed vitrectomy with endolaser and membrane peel of left eye by      Dr. Luciana Axe December 30, 2005.  3. History of gastrointestinal bleeding with clipping of the Dieulafoy      vessel in the fundus.  4. History of left ventricular clot, on Coumadin.  5. History of ischemic cardiomyopathy.  6. Coronary artery disease, status post coronary artery bypass graft.  7. Hypercholesterolemia.  8. History of asthma with beta-blockers.  9. Ejection fraction of 35% in the past.  10.Cataracts.  11.Glaucoma.   MEDICINES AT TIME OF DISCHARGE:  1. Scopolamine 1 drop right eye b.i.d.  2. Zymar 1 drop left eye q.i.d.  3. Pred Forte 1 drop left eye 4 times a day.  4. Accolate 20 mg a day.  5. Altace 25 mg a day.  6. Plavix 75 mg a day.  7. Pravachol 20 mg a day.  8. TriCor 145 mg a day.  9. Urotrol 10 mg a day.  10.Coumadin 5 mg a da.  11.Advair as prior to admission.   STUDIES PERFORMED:  1. CT of the brain shows no acute intracranial abnormality.  2. MRI of the brain shows no acute intracranial abnormality.  Mild      atrophy, likely within normal limits given the patient's age.  3. MRA of the head shows irregularity of distal inferior and PCA branches      suggestive of intracranial atherosclerosis.  No proximal stenosis,      aneurysms, or branch blood vessel occlusions.  4. Carotid Doppler shows no ICA stenosis.  5. Transcranial Dopplers performed, results pending.  6.  Two-D echocardiogram shows EF of 40-45% with akinesis of mid-distal      septal wall.  There was akinesis of  the anteroapical wall.  An apical      thrombus cannot be ruled out.  7. EKG shows sinus bradycardia with sinus arrhythmia, low voltage QRS.   LABORATORY STUDIES:  CBC normal.  Chemistry normal.  INR 2.5 with PT 28.3  and PTT of 41.  Cholesterol 124, triglycerides 78, HDL 41, and LDL 67.  Calcium normal.  Hemoglobin A1c was 5.9 and homocystine was 8.4.   HISTORY OF PRESENT ILLNESS:  [name] is a 77 year old right-handed  male who was admitted to the hospital by Dr. Fawn Kirk for elective  vitrectomy and membrane peel secondary to epiretinal membrane with macular  distortion and macular edema.  The patient tolerated surgery well; but,  postoperatively, became dizziness when sitting upright and complained of  loss of vision  in his right eye, suggestive of left cortical compromise.  Dr. Luciana Axe saw the patient in the recovery room and CT scan of the head was  ordered.  His symptoms improved in approximately 10 minutes back to  baseline. It was felt he may have a TIA.  Dr. Sharene Skeans was notified of  symptoms.  Patient does have vascular risk factors for stroke, with notably  of an old left ventricular clot and cardiomyopathy; however, the patient is  therapeutic on Coumadin.  MRI and MRA were ordered which were negative for  acute stroke.  It was felt the patient had a TIA.   He was admitted to the hospital for 24 hours for stroke workup and was found  to be essentially negative.  His 2-D echocardiogram was pending at the time  of his discharge.  Dr. Sharene Skeans plans to follow up in 4-6 weeks.  Patient is  to go by Dr. Ephriam Knuckles office on his way home from the hospital in order to  have left eye patch removed and potential medication adjustment.   CONDITION AT DISCHARGE:  Left eye patch secondary to surgery.  Visual fields  full, right eye.  Otherwise, neurologically  intact.   DISCHARGE PLAN:  1. Continue Coumadin for secondary stroke prevention.  2. Risk factor control with systolic blood pressure less than 130, LDL      less than 100.  3. Follow up with Dr. Luciana Axe immediately after discharge.  4. Follow up with Dr. Sharene Skeans within the next 2 months.  5. Follow up with primary care physician within 1 month.      Annie Main, N.P.      Deanna Artis. Sharene Skeans, M.D.  Electronically Signed    SB/MEDQ  D:  01/01/2006  T:  01/02/2006  Job:  [id number]   cc:   Jillyn Hidden A. Rankin, M.D.  Deanna Artis. Sharene Skeans, M.D.

## 2010-08-15 NOTE — Cardiovascular Report (Signed)
NAME:  Donald [name], HASTE NO.:  [id number]   MEDICAL RECORD NO.:  [id number]          PATIENT TYPE:  INP   LOCATION:  3728                         FACILITY:  [facility]   PHYSICIAN:  Arvilla Meres, M.D. LHCDATE OF BIRTH:  [birth year]   DATE OF PROCEDURE:  03/03/2004  DATE OF DISCHARGE:                              CARDIAC CATHETERIZATION   PRIMARY CARE PHYSICIAN:  Dr. Wylene Simmer.   CARDIOLOGIST:  Dr. Olga Millers.   PATIENT IDENTIFICATION:  Donald [name] is a very pleasant 77 year old male  with history of coronary artery disease, status post CABG in 1990, with a  LIMA to the LAD and a saphenous vein graft to a diagonal.  He also underwent  PTCA and stenting of his proximal left heart catheterization in October 2004  by Dr. Charlies Constable.  He was referred for diagnostic cardiac catheterization  after experiencing chest pain and ST changes in the setting of a clipping of  a Dieulafoy lesion in his stomach this week.   PROCEDURE:  Risks and benefits were explained to Mr. [name].  Consent was  signed and placed on the chart.  The right groin was prepped and draped in  routine sterile fashion.  The area was anesthetized with 1% local lidocaine.  A 6 French sheath was placed in the right femoral artery using a modified  Seldinger technique.  Catheters used were a JL-4, a JR-4, and an IMA  catheter with an exchange wire.   PROCEDURES PERFORMED:  1.  Selective coronary angiography.  2.  LIMA angiography.   COMPLICATIONS:  None apparent.   Of note, an LV-gram was not performed secondary to a documented LV clot on a  recent echocardiogram.   FINDINGS:  Hemodynamic:  Aortic pressure was 107/60, with a mean of 80.   Coronary arteries:  Left main:  50% distal lesion.   LAD had an 80% proximal lesion, then was totally occluded after a small  first diagonal and septal perforator.  There was mild aneurysmal dilatation  prior to the total occlusion.   Left circumflex had  tandem 40% lesions proximally, but otherwise stent was  widely patent.  There was a 60-70% distal lesion in the circumflex prior to  the vessel giving rise to three large obtuse marginals.  This was unchanged  from his previous catheterization in October 2004.   RCA:  This was a dominant moderate-size vessel, with a 20-30% lesion  ostially and a 20-30% tubular lesion proximally.  There were minor  irregularities throughout the distal vessel.  There was a normal-size  posterior descending branch and three posterolateral branches.   Saphenous vein graft to the first diagonal was totally occluded on previous  study.  Of note, the operative report says the graft was to an OM.  However,  previous catheterization by report was saying that the graft was to a  diagonal.   The LIMA to the LAD:  The LIMA was patent, with perhaps a minor irregularity  in the body of the graft.  However, LAD was totaled just after insertion of  the LIMA.  The proximal LAD filled retrograde  from the LIMA to fill two  small diagonals.   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  Given the lack of  symptoms prior to admission, I would favor medical management.  The films  were reviewed with Dr. Randa Evens, who agreed with the management plan.  Should he develop symptoms, he could in the future consider addressing this  percutaneously.      Donald [name]   DB/MEDQ  D:  03/03/2004  T:  03/04/2004  Job:  [id number]

## 2010-08-15 NOTE — Discharge Summary (Signed)
NAME:  Donald [name], [name] NO.:  [id number]   MEDICAL RECORD NO.:  [id number]          PATIENT TYPE:  INP   LOCATION:  3728                         FACILITY:  [facility]   PHYSICIAN:  Olga Millers, M.D. LHCDATE OF BIRTH:  01-23-1927   DATE OF ADMISSION:  02/27/2004  DATE OF DISCHARGE:  03/04/2004                                 DISCHARGE SUMMARY   PRIMARY CARE PHYSICIAN:  Gaspar Garbe, M.D.   CARDIOLOGIST:  Olga Millers, M.D. [facility].   DISCHARGE DIAGNOSES:  1.  Status post gastrointestinal bleed with clipping of the Dieulafoy vessel      in the fundus.  2.  History of ischemia, cardiomyopathy.  3.  Coronary artery disease status post coronary artery bypass graft.  4.  Apical thrombus by echo.  5.  Increased cholesterol.  6.  History of asthma with beta blockers.  7.  EF in the past was 35%.  Recently, 38% by adenosine Myoview and 55% by      echocardiogram on February 14, 2004.  Recently, hypotension, increased      heart rate and dyspnea.  8.  Guaiac positive stools secondary to #1.  9.  Recent Coumadin therapy secondary to left ventricular clot.  10. Status post cardiac catheterization on November 5 showing borderline      disease in distal circumflex which is unchanged from October 2004.      Given lack of symptoms previous admission, would favor medical      management.  Films reviewed with Dr. Samule Ohm.  The patient was cathed by      Dr. Gala Romney.   HOSPITAL COURSE:  Pleasant 77 year old gentleman followed by Dr. Jens Som,  history as stated above who is found at office on day of admission with  hypotension.  The patient complained of feeling very weak and dizzy and  short of breath.  The patient had checked his blood pressure recently and  found his blood pressure to be low so he stopped taking his Altace.  He  presented to the office on day of admission for further evaluation.  Per the  physical exam, the patient was very pale.  He was found to have  guaiac  positive stool.  The patient admitted for further evaluation.  EKG in the  office on day of admission showed normal sinus rhythm with occasional  premature ectopic complexes, nonspecific T-wave abnormalities, rate of 98.  The patient was admitted and GI consult ordered with Dr. Joanette Gula office  for consult.  His aspirin was held.  Also, his Plavix and his Coumadin.  His  Altace was also held.  The patient was typed and crossed for packed RBCs for  transfusion and he was hydrated with normal saline.  On the 30th, Dr.  Luther Parody got in to see patient.  Guaiac positive stools with anemia in the  face of anticoagulation.  He felt the patient needed to have upper endoscopy  review, possible bleeding complications.  The patient to the endoscopy lab  on the 1st and classic Dieulafoy.  That is when the fundus was bleeding and  injected with epinephrine  and clipped.  The patient was placed on b.i.d.  Protonix.  The patient tolerated procedure without any complications.  Continued to monitor blood work.  Received additional transfusions of packed  RBCs, hemoglobin stable.  The patient to be discharged home.   LAB WORK AT DISCHARGE:  PT 13.4 with an INR of 1.0, potassium 3.9, BUN 13,  creatinine 1.1, hemoglobin 10.2, hematocrit 29.8, platelets 305,000.  Other  pertinent lab work this admission:  TSH 3.352, total cholesterol 99,  triglycerides 70, HDL 34, LDL 51.   Discharging vital signs afebrile with temperature of 97.8, heart rate 68,  respirations 18, blood pressure 94/62, 95% on room air.  The patient seen by  Dr. Jens Som today.   DISPOSITION:  The patient seen by Dr. Jens Som on the 6th.  Plan is to  discharge the patient home today and he will be discharged home on 5 mg of  Coumadin today and Wednesday, 2.5 mg of Coumadin on Thursday and check the  PT in Coumadin clinic on Friday.  We will discontinue the aspirin and  Plavix. Will resume aspirin in six weeks at 81 mg.  We will  hold the Altace  and will resume when Dr. Jens Som sees the patient back in the office in 2-4  weeks.  We will also check a hemoglobin at that time.   DISCHARGE MEDICATIONS:  1.  Coumadin as stated above.  2.  No Altace, no aspirin, no Plavix until Dr. Jens Som resumes.  3.  Continue Accolate, Pravachol, Foradil and Tricor.  4.  Tylenol for general discomfort.   Activity as tolerated.  No driving x2 days.  No lifting over 10 pounds x1  week.  Diet as previous.  The patient has a followup appointment Friday at  the [facility] at 12:45 and appointment with Dr. Jens Som December 20  at 11:45.  Will also have CBC done the that time.       MB/MEDQ  D:  03/04/2004  T:  03/04/2004  Job:  [id number]   cc:   Gaspar Garbe, M.D.  [address]  [place]  [place] [address]  Fax: [phone]   Althea Grimmer. Luther Parody, M.D.  [address]  [place]  [place] [address]  Fax: [phone]

## 2010-08-15 NOTE — Cardiovascular Report (Signed)
NAME:  RAFIK, KOPPEL                         ACCOUNT NO.:  [id number]   MEDICAL RECORD NO.:  [id number]                   PATIENT TYPE:  OIB   LOCATION:  6523                                 FACILITY:  [facility]   PHYSICIAN:  Charlies Constable, M.D.                  DATE OF BIRTH:  [birth year]   DATE OF PROCEDURE:  01/25/2003  DATE OF DISCHARGE:                              CARDIAC CATHETERIZATION   PROCEDURE PERFORMED:  Cardiac catheterization.   CARDIOLOGIST:  Charlies Constable, M.D.   CLINICAL HISTORY:  Mr. Mellinger is 77 years old and had bypass surgery by Dr.  Edwyna Shell in 1990 with a LIMA to the LAD and a vein graft to the diagonal  branch of the LAD.  He subsequently had documented occlusion of the vein  graft to the diagonal LAD and occlusion of the LAD after the insertion of  the LIMA graft.  Recently he has developed recurrent angina and he had a  Cardiolite scan, which showed apical scarring with some peri scar ischemia,  but also some ventricular dilatation suggesting more global ischemia.  For  this reason he was brought in for a catheterization.   DESCRIPTION OF PROCEDURE:  The procedure was performed via the right femoral  artery using arterial sheath and 6 French preformed coronary catheters.  A  femoral arterial puncture was performed and Omnipaque contrast was used.  A  LIMA catheter was used for injection of the LIMA graft.  After completion of  the diagnostic study we made a decision to proceed with intervention on the  ostial lesion of the circumflex artery, which also involved a protected left  main.   The patient was enrolled in the STEEPLE trial and was randomized to 0.75  mg/kg of Lovenox.  He also received 300 mg of Plavix.   We used a 7 [place] CLS-4.0 guiding catheter and a PT-2 wire.  We crossed the  lesion in the ostium of the circumflex artery with the wire without too much  difficulty.  We then passed an IVUS catheter to assess the calcification in  the lesion,  and the vessel size and length to help decide the best approach.  We had difficulty passing the IVUS catheter around a 90-degree bend in the  circumflex artery, but were finally able to accomplish this.  We then  predilated with a 3.0 x 15 mm Maverick performing two inflations up to 10  atmospheres for 30 seconds.  The IVUS run did not show too much  calcification and showed that the lesion was about 8 cm in length.  The  proximal reference in the left min before it became ectatic was 3.5 and the  distal reference was 3.5-4.  We chose a 3.5 x 12 mm TAXUS stent.  We  deployed this with one inflation of 14 atmospheres for 30 seconds.  We felt  that the distal edge of the stent  was not fully opposed based on the IVUS  measurements, and so for this reason we dilated the distal portion of the  stent with a 4.0 x 8 mm Quantum Maverick performing one inflation of 12  atmospheres for 30 seconds.  The LAD, which filled a diagonal branch and  septal perforator before it was occluded, arose within the stent, but the  flow was not compromised.   The patient tolerated the procedure well and left the laboratory in  satisfactory condition.   RESULTS:   ANGIOGRAPHIC DATA:  Left Main Coronary Artery:  The left main coronary  artery had a 40% distal stenosis.   Left Anterior Descending Artery:  The left anterior descending artery was  completely occluded after a small diagonal branch and a septal perforator.  There was some aneurysmal dilatation just before the complete occlusion.   Circumflex Artery:  The circumflex artery gave rise to three posterolateral  branches.  There was 80% ostial stenosis in the circumflex artery.  There  was 40% narrowing in the proximal circumflex artery and there was 50%  narrowing in the distal circumflex artery.   Right Coronary Artery:  The right coronary artery was a moderately large  vessel that gave rise to a right ventricular branch, a posterior descending   branch and three posterolateral branches.  There was 3% narrowing in the  proximal right coronary artery.   Grafts:  The LIMA graft to the LAD was patent, but the LAD was completely  occluded just after the insertion site.  The LAD filled retrograde from the  LIMA and supplied two diagonal branches.   The saphenous vein graft to the diagonal branch was completely occluded by  prior study.   VENTRICULOGRAPHIC DATA:  Left Ventriculogram:  The left ventriculogram  performed in the RAO projection showed apical akinesis-to-dyskinesis.  The  anterolateral wall and anteroapical wall moved well, and the inferior wall  moved well.  The estimated ejection fraction was 35-40%.  The aortic  pressure was 89/60 with a mean of 72.  The left ventricular pressure was  89/7.   Following stenting of the left main-ostial circumflex lesion the stenosis  improved from 80% to less than 10%.   CONCLUSIONS:  1. Coronary artery disease status post coronary bypass graft surgery in     1990.  2. Severe native vessel disease with 40% narrowing in the distal left main,     80% ostial stenosis in the circumflex artery with 40% proximal stenosis     and 50% distal stenosis in the circumflex artery, total occlusion of the     proximal left anterior descending, and 30% narrowing in the proximal     right coronary artery.  3. Apical wall akinesis/dyskinesis with an estimated ejection fraction of 35-     40%.  4. Patent left internal mammary artery graft to the mid left anterior     descending, but occlusion of the left anterior descending after insertion     of the graft.  5.     Successful stenting of the left main-ostial circumflex lesion with     improvement in percent narrowing from 80% to less than 10%.   DISPOSITION:  The patient was transferred to the post angioplasty unit for  further observation.  Charlies Constable, M.D.    BB/MEDQ  D:  01/25/2003  T:   01/26/2003  Job:  [id number]   cc:   Gaspar Garbe, M.D.  [address]  [place]  [place] [address]  Fax: [phone]   Olga Millers, M.D.   Cardiopulmonary Laboratory

## 2010-08-15 NOTE — Consult Note (Signed)
NAME:  Donald [name], Donald [name] NO.:  [id number]   MEDICAL RECORD NO.:  [id number]          PATIENT TYPE:  INP   LOCATION:  3730                         FACILITY:  [facility]   PHYSICIAN:  Althea Grimmer. Santogade, M.D.DATE OF BIRTH:  July 26, 1926   DATE OF CONSULTATION:  02/27/2004  DATE OF DISCHARGE:                                   CONSULTATION   HISTORY OF PRESENT ILLNESS:  Donald [name] is a 77 year old male whom I am  asked to see for dark guaiac positive stool with anemia in the face of  anticoagulation.  He has a history of coronary artery disease, and is status  post bypass surgery, as well as PTCA.  Recently, he underwent an  echocardiogram that demonstrated mild tricuspid regurgitation, an ejection  fraction of 55%, and an __________ apex of the left ventricle with a clot  present.  Coumadin was started at a dose of 5 mg a day apparently about 10  days ago.  The patient was on vacation near [place] when he began to feel  lightheaded, short of breath, and dizzy.  He noted that his stool was  somewhat dark.  At the cardiologist's office this afternoon, his hemoglobin  was 8, platelet count normal, PT 20.4, INR 3.  His BUN is 51, with a  creatinine of 1, which suggests an upper GI bleed.  He has no history of  ulcer disease or abdominal pain.  He has had no vomiting or weight loss.  He  had a screening colonoscopy due to age and family history of colon cancer in  his sister.  This was performed in August of 2003, at which time a 0.8 cm  hepatic flexure adenoma was removed.  Repeat colonoscopy has been advised  for August of 2006.  He has never had an upper endoscopy.  It is noted that  his mother has stomach cancer.  He has been taking aspirin, Plavix, and  Coumadin, which are all currently on hold.   PAST MEDICAL HISTORY:  1.  Pertinent for coronary artery disease, status post CABG and bypass.  2.  Asthma.  3.  Kidney stones.   CURRENT MEDICATIONS:  1.  Plavix on hold.  2.  Aspirin on hold.  3.  Coumadin on hold.  4.  Accolate 20 mg b.i.d.  5.  Tricor 145 mg daily.  6.  Albuterol inhalation therapy.  7.  Nitroglycerin.  8.  Multivitamins.  9.  Altace on hold.  10. Pravachol 20 mg q.h.s.  11. Foradil 12 mcg b.i.d.   ALLERGIES:  None reported.   FAMILY HISTORY:  As noted, stomach cancer in mother.  Colon cancer in  sister.   SOCIAL HISTORY:  He is married.  Retired.  Nonsmoker.  Minimal social  drinker.   REVIEW OF SYSTEMS:  GENERAL:  No weight loss or night sweats.  ENDOCRINE:  No history of diabetes or thyroid problems.  METABOLIC:  Moderately  overweight.  SKIN:  No rash or pruritus, though he does have bruising on his  hand and in his right buttock, which is quite extensive.  EYES:  No icterus  or change in vision.  ENT:  No aphthous ulcers or chronic sore throat.  RESPIRATORY:  Positive for dyspnea on exertion and asthma.  CARDIAC:  Mild  exertional angina (see above).  GI:  As above.  GU:  No dysuria or  hematuria.  The remainder of the review of systems is negative.   PHYSICAL EXAMINATION:  GENERAL:  He is an elderly male in no acute distress.  VITAL SIGNS:  Afebrile.  Blood pressure 94/58, pulse 91.  SKIN:  Normal, with the exception of bruising of his left hand, and an  extensive bruise of the right buttock.  HEENT:  Eyes anicteric.  Oropharynx unremarkable.  CHEST:  Sounds clear.  HEART:  Regular rate and rhythm without murmurs.  ABDOMEN:  Obese and soft without mass, tenderness, or organomegaly.  RECTAL:  Not repeated, but was reported guaiac positive.  EXTREMITIES:  Without clubbing, cyanosis, or edema.   IMPRESSION:  A 77 year old male with likely upper GI bleeding in the face of  antiplatelet agents and Coumadin.  He may have ulcer disease, or this could  be related to an angiodysplasia.  He has a strong family history of GI  neoplasia, as well.  I doubt that repeat colonoscopy is needed at this time,  but I do feel that his  upper GI tract needs to be investigated.   PLAN:  Upper endoscopy is reviewed with the patient in terms of technique,  preparation, and risk of complications including bleeding or perforation.  He agrees to proceed.  It will be performed at 7:30 in the morning.  Please  see the orders.  Protonix is begun, and he is NPO except for medicines after  midline.  He will be transfused one unit, and serial hemoglobins checked  throughout the night, with transfusions p.r.n.       PJS/MEDQ  D:  02/27/2004  T:  02/27/2004  Job:  [id number]   cc:   Gaspar Garbe, M.D.  [address]  [place]  [place] [address]  Fax: [phone]   Olga Millers, M.D. [facility]

## 2010-08-20 ENCOUNTER — Ambulatory Visit (INDEPENDENT_AMBULATORY_CARE_PROVIDER_SITE_OTHER): Payer: Medicare Other | Admitting: *Deleted

## 2010-08-20 DIAGNOSIS — I238 Other current complications following acute myocardial infarction: Secondary | ICD-10-CM

## 2010-08-20 DIAGNOSIS — I4891 Unspecified atrial fibrillation: Secondary | ICD-10-CM

## 2010-08-20 DIAGNOSIS — G459 Transient cerebral ischemic attack, unspecified: Secondary | ICD-10-CM

## 2010-08-20 DIAGNOSIS — I6350 Cerebral infarction due to unspecified occlusion or stenosis of unspecified cerebral artery: Secondary | ICD-10-CM

## 2010-08-21 NOTE — Telephone Encounter (Signed)
Pt needs refill of fenofibrate 200 mg to [facility] mail order, was told to contact pcp, however pt states dr Jens Som prescribed-pt [phone] if any questions

## 2010-08-26 NOTE — Telephone Encounter (Signed)
Refill fenofibrate 200 mg.  [facility] Mail order [phone].

## 2010-08-28 ENCOUNTER — Telehealth: Payer: Self-pay | Admitting: Cardiology

## 2010-08-28 NOTE — Telephone Encounter (Signed)
RN s/w Pt re: fenofibrate medication. Pt was told that Primary Care MD should order, however Dr Jens Som began Pt on medication and Primary Care MD has not resolved this issue. RN was given a number [phone] to call [facility] and invoice number [id number] to reference. RN s/w Shawna Orleans @ [facility] stated needed new Rx called in for fenofibrate 200 mg. RN called in new Rx.

## 2010-08-28 NOTE — Telephone Encounter (Signed)
Pt is suppose to have fenofibrate 200mg  qd and he has trouble getting it filled he has been given the run around and he needs to talk to someone asap

## 2010-09-03 ENCOUNTER — Other Ambulatory Visit: Payer: Self-pay | Admitting: Cardiology

## 2010-09-11 ENCOUNTER — Encounter: Payer: Self-pay | Admitting: Cardiology

## 2010-09-16 ENCOUNTER — Ambulatory Visit (INDEPENDENT_AMBULATORY_CARE_PROVIDER_SITE_OTHER): Payer: Medicare Other | Admitting: *Deleted

## 2010-09-16 DIAGNOSIS — I4891 Unspecified atrial fibrillation: Secondary | ICD-10-CM

## 2010-09-16 DIAGNOSIS — G459 Transient cerebral ischemic attack, unspecified: Secondary | ICD-10-CM

## 2010-09-16 DIAGNOSIS — I6350 Cerebral infarction due to unspecified occlusion or stenosis of unspecified cerebral artery: Secondary | ICD-10-CM

## 2010-09-16 DIAGNOSIS — I238 Other current complications following acute myocardial infarction: Secondary | ICD-10-CM

## 2010-09-16 LAB — POCT INR: INR: 2.5

## 2010-10-14 ENCOUNTER — Ambulatory Visit (INDEPENDENT_AMBULATORY_CARE_PROVIDER_SITE_OTHER): Payer: Medicare Other | Admitting: *Deleted

## 2010-10-14 DIAGNOSIS — I6350 Cerebral infarction due to unspecified occlusion or stenosis of unspecified cerebral artery: Secondary | ICD-10-CM

## 2010-10-14 DIAGNOSIS — I4891 Unspecified atrial fibrillation: Secondary | ICD-10-CM

## 2010-10-14 DIAGNOSIS — G459 Transient cerebral ischemic attack, unspecified: Secondary | ICD-10-CM

## 2010-10-14 DIAGNOSIS — I238 Other current complications following acute myocardial infarction: Secondary | ICD-10-CM

## 2010-10-31 ENCOUNTER — Ambulatory Visit (INDEPENDENT_AMBULATORY_CARE_PROVIDER_SITE_OTHER): Payer: Medicare Other | Admitting: Cardiology

## 2010-10-31 ENCOUNTER — Encounter: Payer: Self-pay | Admitting: Cardiology

## 2010-10-31 ENCOUNTER — Ambulatory Visit (INDEPENDENT_AMBULATORY_CARE_PROVIDER_SITE_OTHER): Payer: Medicare Other | Admitting: *Deleted

## 2010-10-31 VITALS — BP 110/64 | HR 70 | Resp 18 | Ht 70.0 in | Wt 185.1 lb

## 2010-10-31 DIAGNOSIS — I428 Other cardiomyopathies: Secondary | ICD-10-CM

## 2010-10-31 DIAGNOSIS — G459 Transient cerebral ischemic attack, unspecified: Secondary | ICD-10-CM

## 2010-10-31 DIAGNOSIS — I238 Other current complications following acute myocardial infarction: Secondary | ICD-10-CM

## 2010-10-31 DIAGNOSIS — E785 Hyperlipidemia, unspecified: Secondary | ICD-10-CM

## 2010-10-31 DIAGNOSIS — I4891 Unspecified atrial fibrillation: Secondary | ICD-10-CM

## 2010-10-31 DIAGNOSIS — I2510 Atherosclerotic heart disease of native coronary artery without angina pectoris: Secondary | ICD-10-CM

## 2010-10-31 DIAGNOSIS — I6350 Cerebral infarction due to unspecified occlusion or stenosis of unspecified cerebral artery: Secondary | ICD-10-CM

## 2010-10-31 LAB — POCT INR: INR: 1.9

## 2010-10-31 NOTE — Assessment & Plan Note (Signed)
Continue Coumadin. Hemoglobin monitored by primary care. 

## 2010-10-31 NOTE — Assessment & Plan Note (Signed)
Continue aspirin, ACE inhibitor and statin. He has not tolerated beta blockers because of worsening asthma.

## 2010-10-31 NOTE — Progress Notes (Signed)
HPI:The patient is a very pleasant  gentleman who has a history of coronary artery disease status post coronary bypassing graft in 1990. His most recent Myoview was performed in Sept 2011.  The patient's ejection fraction was 43%.  There was a previous infarct, but there was no ischemia noted.  He also has a history of an apical thrombus as well as a prior CVA felt possibly secondary embolic in nature. Prior abdominal ultrasound in May 2006 showed no aneurysm. Carotid Dopplers in November of 2005 showed 0-39% bilaterally. MRA in October of 2007 showed no obstructive disease. I last saw him in August of 2011. Since then he is doing well. There is no dyspnea on exertion, orthopnea, PND, pedal edema, palpitations, syncope or chest pain.  Current Outpatient Prescriptions  Medication Sig Dispense Refill  . aspirin 81 MG tablet Take 81 mg by mouth daily.        . Cholecalciferol (CVS VITAMIN D) 2000 UNITS CAPS Take 1 capsule by mouth daily.        . fenofibrate micronized (LOFIBRA) 200 MG capsule Take 200 mg by mouth daily.        . Fluticasone-Salmeterol (ADVAIR DISKUS) 100-50 MCG/DOSE AEPB Inhale 1 puff into the lungs daily.        Marland Kitchen latanoprost (XALATAN) 0.005 % ophthalmic solution Place 1 drop into the left eye daily.        . Multiple Vitamin (MULTIVITAMIN) capsule Take 1 capsule by mouth daily.        . pravastatin (PRAVACHOL) 20 MG tablet Take 20 mg by mouth at bedtime.        . ramipril (ALTACE) 2.5 MG capsule Take 2.5 mg by mouth 2 (two) times daily.        . Tamsulosin HCl (FLOMAX) 0.4 MG CAPS Take 0.4 mg by mouth daily.        Marland Kitchen warfarin (COUMADIN) 5 MG tablet TAKE AS DIRECTED BY ANTICOAGULATION CLINIC  90 tablet  2     Past Medical History  Diagnosis Date  . Post-infarction apical thrombus   . CAD (coronary artery disease)   . Ischemic cardiomyopathy   . CVA (cerebral infarction)   . Hyperlipidemia   . Asthma     with worsening with beta blockade  . GI bleed     due to doll fully  vessel which was clipped  . Glaucoma     excellent cataracts  . Nephrolithiasis     Past Surgical History  Procedure Date  . Coronary artery bypass graft 1990    LIMA to the LAD and saphenous vein graft tothe diagonal    History   Social History  . Marital Status: Married    Spouse Name: N/A    Number of Children: 3  . Years of Education: N/A   Occupational History  . Retired Equities trader    Social History Main Topics  . Smoking status: Never Smoker   . Smokeless tobacco: Not on file  . Alcohol Use: No  . Drug Use: No  . Sexually Active: Not on file   Other Topics Concern  . Not on file   Social History Narrative  . No narrative on file    ROS: no fevers or chills, productive cough, hemoptysis, dysphasia, odynophagia, melena, hematochezia, dysuria, hematuria, rash, seizure activity, orthopnea, PND, pedal edema, claudication. Remaining systems are negative.  Physical Exam: Well-developed well-nourished in no acute distress.  Skin is warm and dry.  HEENT is normal.  Neck is supple. No  thyromegaly.  Chest is clear to auscultation with normal expansion.  Cardiovascular exam is regular rate and rhythm.  Abdominal exam nontender or distended. No masses palpated. Extremities show no edema. neuro grossly intact  ECG NSR with nonspecific ST changes.

## 2010-10-31 NOTE — Assessment & Plan Note (Signed)
Continue present medications. Last Myoview low risk.

## 2010-10-31 NOTE — Patient Instructions (Signed)
The current medical regimen is effective;  continue present plan and medications.  Follow up in 1 year with Dr Crenshaw.  You will receive a letter in the mail 2 months before you are due.  Please call us when you receive this letter to schedule your follow up appointment.  

## 2010-10-31 NOTE — Assessment & Plan Note (Signed)
Continue statin. Lipids and liver monitored by primary care. 

## 2010-11-04 ENCOUNTER — Encounter: Payer: Medicare Other | Admitting: *Deleted

## 2010-11-17 ENCOUNTER — Ambulatory Visit (INDEPENDENT_AMBULATORY_CARE_PROVIDER_SITE_OTHER): Payer: Medicare Other | Admitting: *Deleted

## 2010-11-17 DIAGNOSIS — I6350 Cerebral infarction due to unspecified occlusion or stenosis of unspecified cerebral artery: Secondary | ICD-10-CM

## 2010-11-17 DIAGNOSIS — I238 Other current complications following acute myocardial infarction: Secondary | ICD-10-CM

## 2010-11-17 DIAGNOSIS — G459 Transient cerebral ischemic attack, unspecified: Secondary | ICD-10-CM

## 2010-11-17 DIAGNOSIS — I4891 Unspecified atrial fibrillation: Secondary | ICD-10-CM

## 2010-11-17 LAB — POCT INR: INR: 2.6

## 2010-12-15 ENCOUNTER — Ambulatory Visit (INDEPENDENT_AMBULATORY_CARE_PROVIDER_SITE_OTHER): Payer: Medicare Other | Admitting: *Deleted

## 2010-12-15 DIAGNOSIS — I6350 Cerebral infarction due to unspecified occlusion or stenosis of unspecified cerebral artery: Secondary | ICD-10-CM

## 2010-12-15 DIAGNOSIS — I4891 Unspecified atrial fibrillation: Secondary | ICD-10-CM

## 2010-12-15 DIAGNOSIS — I238 Other current complications following acute myocardial infarction: Secondary | ICD-10-CM

## 2010-12-15 DIAGNOSIS — G459 Transient cerebral ischemic attack, unspecified: Secondary | ICD-10-CM

## 2010-12-15 LAB — POCT INR: INR: 2.1

## 2011-01-12 ENCOUNTER — Ambulatory Visit (INDEPENDENT_AMBULATORY_CARE_PROVIDER_SITE_OTHER): Payer: Medicare Other | Admitting: *Deleted

## 2011-01-12 DIAGNOSIS — I4891 Unspecified atrial fibrillation: Secondary | ICD-10-CM

## 2011-01-12 DIAGNOSIS — G459 Transient cerebral ischemic attack, unspecified: Secondary | ICD-10-CM

## 2011-01-12 DIAGNOSIS — I6350 Cerebral infarction due to unspecified occlusion or stenosis of unspecified cerebral artery: Secondary | ICD-10-CM

## 2011-01-12 DIAGNOSIS — I238 Other current complications following acute myocardial infarction: Secondary | ICD-10-CM

## 2011-01-26 ENCOUNTER — Ambulatory Visit (INDEPENDENT_AMBULATORY_CARE_PROVIDER_SITE_OTHER): Payer: Medicare Other | Admitting: *Deleted

## 2011-01-26 DIAGNOSIS — I238 Other current complications following acute myocardial infarction: Secondary | ICD-10-CM

## 2011-01-26 DIAGNOSIS — I6350 Cerebral infarction due to unspecified occlusion or stenosis of unspecified cerebral artery: Secondary | ICD-10-CM

## 2011-01-26 DIAGNOSIS — I4891 Unspecified atrial fibrillation: Secondary | ICD-10-CM

## 2011-01-26 DIAGNOSIS — G459 Transient cerebral ischemic attack, unspecified: Secondary | ICD-10-CM

## 2011-02-09 ENCOUNTER — Ambulatory Visit (INDEPENDENT_AMBULATORY_CARE_PROVIDER_SITE_OTHER): Payer: Medicare Other | Admitting: *Deleted

## 2011-02-09 DIAGNOSIS — I238 Other current complications following acute myocardial infarction: Secondary | ICD-10-CM

## 2011-02-09 DIAGNOSIS — I6350 Cerebral infarction due to unspecified occlusion or stenosis of unspecified cerebral artery: Secondary | ICD-10-CM

## 2011-02-09 DIAGNOSIS — G459 Transient cerebral ischemic attack, unspecified: Secondary | ICD-10-CM

## 2011-02-09 DIAGNOSIS — I4891 Unspecified atrial fibrillation: Secondary | ICD-10-CM

## 2011-02-09 LAB — POCT INR: INR: 3.4

## 2011-02-18 ENCOUNTER — Other Ambulatory Visit: Payer: Self-pay | Admitting: Cardiology

## 2011-02-25 ENCOUNTER — Telehealth: Payer: Self-pay | Admitting: Cardiology

## 2011-02-25 ENCOUNTER — Ambulatory Visit (INDEPENDENT_AMBULATORY_CARE_PROVIDER_SITE_OTHER): Payer: Medicare Other | Admitting: *Deleted

## 2011-02-25 DIAGNOSIS — I6350 Cerebral infarction due to unspecified occlusion or stenosis of unspecified cerebral artery: Secondary | ICD-10-CM

## 2011-02-25 DIAGNOSIS — I238 Other current complications following acute myocardial infarction: Secondary | ICD-10-CM

## 2011-02-25 DIAGNOSIS — I4891 Unspecified atrial fibrillation: Secondary | ICD-10-CM

## 2011-02-25 DIAGNOSIS — G459 Transient cerebral ischemic attack, unspecified: Secondary | ICD-10-CM

## 2011-02-25 NOTE — Telephone Encounter (Signed)
Spoke with pt, he is aware he will need lovenox. Will forward to sally putt pharm md to call pt and get pt set up for shots Deliah Goody

## 2011-02-25 NOTE — Telephone Encounter (Signed)
Spoke with pt, he is needing an injection with dr Ethelene Hal and they need clearance for the pt to hold his coumadin 5 days prior to the procedure Will forward for dr Jens Som review Deliah Goody

## 2011-02-25 NOTE — Telephone Encounter (Signed)
Pt calling re pinched nerve will be getting an injection for, needs to be off coumadin for 1 week, needs ok

## 2011-02-25 NOTE — Telephone Encounter (Signed)
Spoke with pt.  No date set for injection yet.  He does have some Lovenox at home from his daughter. He has 2- 80mg  syringes at home he would like to use if possible.  Will fax clearance to Dr. Ethelene Hal and patient will call with procedure date to proceed with lovenox dosing.

## 2011-02-25 NOTE — Telephone Encounter (Signed)
H/O embolic CVA; will need lovenox bridge Olga Millers

## 2011-02-26 MED ORDER — ENOXAPARIN SODIUM 120 MG/0.8ML ~~LOC~~ SOLN: 120.0000 mg | SUBCUTANEOUS | Status: DC

## 2011-02-26 NOTE — Telephone Encounter (Signed)
Pt to take an extra 1/2 tablet for 1st dose after he resumes, then continue his normal dose along with Lovenox. F/U make for 03/09/11.

## 2011-02-26 NOTE — Telephone Encounter (Signed)
Pt telephoned states his injection will be on 03/04/11 at 1030am. Pt is aware to take last dose today of coumadin.  Weight per OV note 08/12 was 84KG. Pt states he has a copy of labs from his OV visit in June, his BUN is 21 and Creat 0.8. Pt is start Lovenox on 02/28/11 120mg  SQ Daily.  This will continue daily until last injection on 03/03/11. Pt will follow up with [place] post procedure after Dr Ethelene Hal instructs pt on when to resume anticoagulants. (Warfarin and Lovenox).

## 2011-03-09 ENCOUNTER — Ambulatory Visit (INDEPENDENT_AMBULATORY_CARE_PROVIDER_SITE_OTHER): Payer: Medicare Other | Admitting: *Deleted

## 2011-03-09 DIAGNOSIS — G459 Transient cerebral ischemic attack, unspecified: Secondary | ICD-10-CM

## 2011-03-09 DIAGNOSIS — I238 Other current complications following acute myocardial infarction: Secondary | ICD-10-CM

## 2011-03-09 DIAGNOSIS — I4891 Unspecified atrial fibrillation: Secondary | ICD-10-CM

## 2011-03-09 DIAGNOSIS — I6350 Cerebral infarction due to unspecified occlusion or stenosis of unspecified cerebral artery: Secondary | ICD-10-CM

## 2011-03-11 ENCOUNTER — Encounter: Payer: Medicare Other | Admitting: *Deleted

## 2011-03-12 ENCOUNTER — Ambulatory Visit (INDEPENDENT_AMBULATORY_CARE_PROVIDER_SITE_OTHER): Payer: Medicare Other | Admitting: *Deleted

## 2011-03-12 DIAGNOSIS — I6350 Cerebral infarction due to unspecified occlusion or stenosis of unspecified cerebral artery: Secondary | ICD-10-CM

## 2011-03-12 DIAGNOSIS — I238 Other current complications following acute myocardial infarction: Secondary | ICD-10-CM

## 2011-03-12 DIAGNOSIS — G459 Transient cerebral ischemic attack, unspecified: Secondary | ICD-10-CM

## 2011-03-12 DIAGNOSIS — I4891 Unspecified atrial fibrillation: Secondary | ICD-10-CM

## 2011-03-23 ENCOUNTER — Ambulatory Visit (INDEPENDENT_AMBULATORY_CARE_PROVIDER_SITE_OTHER): Payer: Medicare Other | Admitting: *Deleted

## 2011-03-23 DIAGNOSIS — I6350 Cerebral infarction due to unspecified occlusion or stenosis of unspecified cerebral artery: Secondary | ICD-10-CM

## 2011-03-23 DIAGNOSIS — I238 Other current complications following acute myocardial infarction: Secondary | ICD-10-CM

## 2011-03-23 DIAGNOSIS — G459 Transient cerebral ischemic attack, unspecified: Secondary | ICD-10-CM

## 2011-03-23 DIAGNOSIS — I4891 Unspecified atrial fibrillation: Secondary | ICD-10-CM

## 2011-04-06 ENCOUNTER — Ambulatory Visit (INDEPENDENT_AMBULATORY_CARE_PROVIDER_SITE_OTHER): Payer: Medicare Other | Admitting: *Deleted

## 2011-04-06 DIAGNOSIS — I238 Other current complications following acute myocardial infarction: Secondary | ICD-10-CM

## 2011-04-06 DIAGNOSIS — G459 Transient cerebral ischemic attack, unspecified: Secondary | ICD-10-CM | POA: Diagnosis not present

## 2011-04-06 DIAGNOSIS — I6350 Cerebral infarction due to unspecified occlusion or stenosis of unspecified cerebral artery: Secondary | ICD-10-CM | POA: Diagnosis not present

## 2011-04-06 DIAGNOSIS — I4891 Unspecified atrial fibrillation: Secondary | ICD-10-CM

## 2011-04-28 ENCOUNTER — Ambulatory Visit (INDEPENDENT_AMBULATORY_CARE_PROVIDER_SITE_OTHER): Payer: Medicare Other | Admitting: Pharmacist

## 2011-04-28 DIAGNOSIS — I6350 Cerebral infarction due to unspecified occlusion or stenosis of unspecified cerebral artery: Secondary | ICD-10-CM | POA: Diagnosis not present

## 2011-04-28 DIAGNOSIS — I4891 Unspecified atrial fibrillation: Secondary | ICD-10-CM | POA: Diagnosis not present

## 2011-04-28 DIAGNOSIS — G459 Transient cerebral ischemic attack, unspecified: Secondary | ICD-10-CM | POA: Diagnosis not present

## 2011-04-28 DIAGNOSIS — I238 Other current complications following acute myocardial infarction: Secondary | ICD-10-CM

## 2011-04-28 LAB — POCT INR: INR: 2.2

## 2011-05-26 ENCOUNTER — Ambulatory Visit (INDEPENDENT_AMBULATORY_CARE_PROVIDER_SITE_OTHER): Payer: Medicare Other | Admitting: *Deleted

## 2011-05-26 DIAGNOSIS — I6350 Cerebral infarction due to unspecified occlusion or stenosis of unspecified cerebral artery: Secondary | ICD-10-CM

## 2011-05-26 DIAGNOSIS — I4891 Unspecified atrial fibrillation: Secondary | ICD-10-CM | POA: Diagnosis not present

## 2011-05-26 DIAGNOSIS — G459 Transient cerebral ischemic attack, unspecified: Secondary | ICD-10-CM

## 2011-05-26 DIAGNOSIS — I238 Other current complications following acute myocardial infarction: Secondary | ICD-10-CM | POA: Diagnosis not present

## 2011-05-26 LAB — POCT INR: INR: 2.2

## 2011-06-19 DIAGNOSIS — C61 Malignant neoplasm of prostate: Secondary | ICD-10-CM | POA: Diagnosis not present

## 2011-07-07 ENCOUNTER — Ambulatory Visit (INDEPENDENT_AMBULATORY_CARE_PROVIDER_SITE_OTHER): Payer: Medicare Other | Admitting: *Deleted

## 2011-07-07 DIAGNOSIS — I6350 Cerebral infarction due to unspecified occlusion or stenosis of unspecified cerebral artery: Secondary | ICD-10-CM

## 2011-07-07 DIAGNOSIS — I238 Other current complications following acute myocardial infarction: Secondary | ICD-10-CM

## 2011-07-07 DIAGNOSIS — I4891 Unspecified atrial fibrillation: Secondary | ICD-10-CM | POA: Diagnosis not present

## 2011-07-07 DIAGNOSIS — G459 Transient cerebral ischemic attack, unspecified: Secondary | ICD-10-CM | POA: Diagnosis not present

## 2011-07-07 LAB — POCT INR: INR: 2.6

## 2011-07-09 ENCOUNTER — Other Ambulatory Visit: Payer: Self-pay | Admitting: Cardiology

## 2011-07-09 MED ORDER — WARFARIN SODIUM 5 MG PO TABS: ORAL_TABLET | ORAL | Status: DC

## 2011-07-09 NOTE — Telephone Encounter (Signed)
Pt needs refill on warfrin

## 2011-07-15 DIAGNOSIS — H409 Unspecified glaucoma: Secondary | ICD-10-CM | POA: Diagnosis not present

## 2011-07-15 DIAGNOSIS — Z961 Presence of intraocular lens: Secondary | ICD-10-CM | POA: Diagnosis not present

## 2011-07-15 DIAGNOSIS — H4011X0 Primary open-angle glaucoma, stage unspecified: Secondary | ICD-10-CM | POA: Diagnosis not present

## 2011-07-15 DIAGNOSIS — H47239 Glaucomatous optic atrophy, unspecified eye: Secondary | ICD-10-CM | POA: Diagnosis not present

## 2011-08-13 DIAGNOSIS — D1801 Hemangioma of skin and subcutaneous tissue: Secondary | ICD-10-CM | POA: Diagnosis not present

## 2011-08-13 DIAGNOSIS — L570 Actinic keratosis: Secondary | ICD-10-CM | POA: Diagnosis not present

## 2011-08-13 DIAGNOSIS — L819 Disorder of pigmentation, unspecified: Secondary | ICD-10-CM | POA: Diagnosis not present

## 2011-08-13 DIAGNOSIS — L821 Other seborrheic keratosis: Secondary | ICD-10-CM | POA: Diagnosis not present

## 2011-08-14 ENCOUNTER — Telehealth: Payer: Self-pay | Admitting: Cardiology

## 2011-08-14 NOTE — Telephone Encounter (Signed)
New Problem:     I called the patient's home phone number in an attempt to schedule an appointment and, after several attempts to bring him to the phone, was told by his wife that she would get his attention sooner or later and have him call back.

## 2011-08-17 ENCOUNTER — Ambulatory Visit (INDEPENDENT_AMBULATORY_CARE_PROVIDER_SITE_OTHER): Payer: Medicare Other | Admitting: *Deleted

## 2011-08-17 DIAGNOSIS — I238 Other current complications following acute myocardial infarction: Secondary | ICD-10-CM | POA: Diagnosis not present

## 2011-08-17 DIAGNOSIS — I4891 Unspecified atrial fibrillation: Secondary | ICD-10-CM

## 2011-08-17 DIAGNOSIS — G459 Transient cerebral ischemic attack, unspecified: Secondary | ICD-10-CM | POA: Diagnosis not present

## 2011-08-17 DIAGNOSIS — I6350 Cerebral infarction due to unspecified occlusion or stenosis of unspecified cerebral artery: Secondary | ICD-10-CM

## 2011-08-17 LAB — POCT INR: INR: 2.8

## 2011-08-27 DIAGNOSIS — Z79899 Other long term (current) drug therapy: Secondary | ICD-10-CM | POA: Diagnosis not present

## 2011-08-27 DIAGNOSIS — C61 Malignant neoplasm of prostate: Secondary | ICD-10-CM | POA: Diagnosis not present

## 2011-08-27 DIAGNOSIS — R82998 Other abnormal findings in urine: Secondary | ICD-10-CM | POA: Diagnosis not present

## 2011-08-27 DIAGNOSIS — I2510 Atherosclerotic heart disease of native coronary artery without angina pectoris: Secondary | ICD-10-CM | POA: Diagnosis not present

## 2011-09-02 ENCOUNTER — Other Ambulatory Visit: Payer: Self-pay | Admitting: Cardiology

## 2011-09-02 MED ORDER — RAMIPRIL 2.5 MG PO CAPS: 2.5000 mg | ORAL_CAPSULE | Freq: Two times a day (BID) | ORAL | Status: DC

## 2011-09-03 DIAGNOSIS — I259 Chronic ischemic heart disease, unspecified: Secondary | ICD-10-CM | POA: Diagnosis not present

## 2011-09-03 DIAGNOSIS — Z0000 Encounter for general adult medical examination without abnormal findings: Secondary | ICD-10-CM | POA: Diagnosis not present

## 2011-09-03 DIAGNOSIS — H6120 Impacted cerumen, unspecified ear: Secondary | ICD-10-CM | POA: Diagnosis not present

## 2011-09-03 DIAGNOSIS — J449 Chronic obstructive pulmonary disease, unspecified: Secondary | ICD-10-CM | POA: Diagnosis not present

## 2011-09-07 DIAGNOSIS — Z1212 Encounter for screening for malignant neoplasm of rectum: Secondary | ICD-10-CM | POA: Diagnosis not present

## 2011-09-08 DIAGNOSIS — M949 Disorder of cartilage, unspecified: Secondary | ICD-10-CM | POA: Diagnosis not present

## 2011-09-08 DIAGNOSIS — M899 Disorder of bone, unspecified: Secondary | ICD-10-CM | POA: Diagnosis not present

## 2011-09-10 ENCOUNTER — Other Ambulatory Visit (HOSPITAL_COMMUNITY): Payer: Self-pay | Admitting: *Deleted

## 2011-09-10 MED ORDER — FENOFIBRATE MICRONIZED 200 MG PO CAPS: 200.0000 mg | ORAL_CAPSULE | Freq: Every day | ORAL | Status: DC

## 2011-09-10 NOTE — Telephone Encounter (Signed)
Refilled fenofibrate

## 2011-09-17 ENCOUNTER — Encounter: Payer: Self-pay | Admitting: Cardiology

## 2011-09-17 ENCOUNTER — Ambulatory Visit (INDEPENDENT_AMBULATORY_CARE_PROVIDER_SITE_OTHER): Payer: Medicare Other | Admitting: Cardiology

## 2011-09-17 ENCOUNTER — Encounter: Payer: Self-pay | Admitting: *Deleted

## 2011-09-17 ENCOUNTER — Ambulatory Visit (INDEPENDENT_AMBULATORY_CARE_PROVIDER_SITE_OTHER): Payer: Medicare Other

## 2011-09-17 ENCOUNTER — Ambulatory Visit (INDEPENDENT_AMBULATORY_CARE_PROVIDER_SITE_OTHER)
Admission: RE | Admit: 2011-09-17 | Discharge: 2011-09-17 | Disposition: A | Discharge status: Home / Self Care | Payer: Medicare Other | Source: Ambulatory Visit | Attending: Cardiology | Admitting: Cardiology

## 2011-09-17 VITALS — BP 99/63 | HR 64 | Wt 179.0 lb

## 2011-09-17 DIAGNOSIS — R918 Other nonspecific abnormal finding of lung field: Secondary | ICD-10-CM | POA: Diagnosis not present

## 2011-09-17 DIAGNOSIS — G459 Transient cerebral ischemic attack, unspecified: Secondary | ICD-10-CM | POA: Diagnosis not present

## 2011-09-17 DIAGNOSIS — R0602 Shortness of breath: Secondary | ICD-10-CM | POA: Diagnosis not present

## 2011-09-17 DIAGNOSIS — R0609 Other forms of dyspnea: Secondary | ICD-10-CM | POA: Diagnosis not present

## 2011-09-17 DIAGNOSIS — I6350 Cerebral infarction due to unspecified occlusion or stenosis of unspecified cerebral artery: Secondary | ICD-10-CM

## 2011-09-17 DIAGNOSIS — R0989 Other specified symptoms and signs involving the circulatory and respiratory systems: Secondary | ICD-10-CM

## 2011-09-17 DIAGNOSIS — I2510 Atherosclerotic heart disease of native coronary artery without angina pectoris: Secondary | ICD-10-CM

## 2011-09-17 DIAGNOSIS — R079 Chest pain, unspecified: Secondary | ICD-10-CM

## 2011-09-17 DIAGNOSIS — I238 Other current complications following acute myocardial infarction: Secondary | ICD-10-CM

## 2011-09-17 DIAGNOSIS — E785 Hyperlipidemia, unspecified: Secondary | ICD-10-CM

## 2011-09-17 DIAGNOSIS — I4891 Unspecified atrial fibrillation: Secondary | ICD-10-CM | POA: Diagnosis not present

## 2011-09-17 DIAGNOSIS — I428 Other cardiomyopathies: Secondary | ICD-10-CM

## 2011-09-17 DIAGNOSIS — R0789 Other chest pain: Secondary | ICD-10-CM | POA: Diagnosis not present

## 2011-09-17 LAB — CBC WITH DIFFERENTIAL/PLATELET
Basophils Absolute: 0 10*3/uL (ref 0.0–0.1)
Eosinophils Absolute: 0.2 10*3/uL (ref 0.0–0.7)
Lymphocytes Relative: 19.2 % (ref 12.0–46.0)
Monocytes Relative: 7.5 % (ref 3.0–12.0)
Neutrophils Relative %: 69.7 % (ref 43.0–77.0)
Platelets: 239 10*3/uL (ref 150.0–400.0)
RDW: 14.2 % (ref 11.5–14.6)

## 2011-09-17 LAB — BASIC METABOLIC PANEL
BUN: 23 mg/dL (ref 6–23)
CO2: 25 mEq/L (ref 19–32)
Calcium: 9.1 mg/dL (ref 8.4–10.5)
Chloride: 104 mEq/L (ref 96–112)
Creatinine, Ser: 0.9 mg/dL (ref 0.4–1.5)
GFR: 86.73 mL/min (ref >60.00)
Glucose, Bld: 96 mg/dL (ref 70–99)
Potassium: 4.3 mEq/L (ref 3.5–5.1)
Sodium: 139 mEq/L (ref 135–145)

## 2011-09-17 LAB — PROTIME-INR: INR: 2.9 ratio — ABNORMAL HIGH (ref 0.8–1.0)

## 2011-09-17 LAB — POCT INR: INR: 2.8

## 2011-09-17 NOTE — Progress Notes (Signed)
 HPI: The patient is a very pleasant gentleman who has a history of coronary artery disease status post coronary bypassing graft in 1990. His most recent Myoview was performed in Sept 2011. The patient's ejection fraction was 43%. There was a previous infarct, but there was no ischemia noted. He also has a history of an apical thrombus as well as a prior CVA felt possibly secondary embolic in nature. Prior abdominal ultrasound in May 2006 showed no aneurysm. Carotid Dopplers in November of 2005 showed 0-39% bilaterally. MRA in October of 2007 showed no obstructive disease. I last saw him in August of 2012. Since then over the past 4-6 months he has noticed increased dyspnea on exertion with walking up hills. There is also chest discomfort that he finds difficult to describe. His symptoms have progressed but do not occur at rest. There is no orthopnea, PND, pedal edema, palpitations or syncope. Note his chest pain is not pleuritic. There are no associated symptoms. They're similar to his symptoms prior to his previous PCI.   Current Outpatient Prescriptions  Medication Sig Dispense Refill  . aspirin 81 MG tablet Take 81 mg by mouth daily.        . Calcium Acetate, Phos Binder, (CALCIUM ACETATE PO) Take 1 tablet by mouth daily.      . Cholecalciferol (CVS VITAMIN D) 2000 UNITS CAPS Take 1 capsule by mouth daily.        . fenofibrate micronized (LOFIBRA) 200 MG capsule Take 1 capsule (200 mg total) by mouth daily.  90 capsule  3  . Fluticasone-Salmeterol (ADVAIR DISKUS) 100-50 MCG/DOSE AEPB Inhale 1 puff into the lungs daily.        . latanoprost (XALATAN) 0.005 % ophthalmic solution Place 1 drop into the left eye daily.        . Multiple Vitamin (MULTIVITAMIN) capsule Take 1 capsule by mouth daily.        . pravastatin (PRAVACHOL) 20 MG tablet Take 20 mg by mouth at bedtime.        . ramipril (ALTACE) 2.5 MG capsule Take 1 capsule (2.5 mg total) by mouth 2 (two) times daily.  180 capsule  12  .  Tamsulosin HCl (FLOMAX) 0.4 MG CAPS Take 0.4 mg by mouth daily.        . warfarin (COUMADIN) 5 MG tablet Take as directed by anticoagulation clinic  90 tablet  1     Past Medical History  Diagnosis Date  . Post-infarction apical thrombus   . CAD (coronary artery disease)   . Ischemic cardiomyopathy   . CVA (cerebral infarction)   . Hyperlipidemia   . Asthma     with worsening with beta blockade  . GI bleed     due to doll fully vessel which was clipped  . Glaucoma     excellent cataracts  . Nephrolithiasis     Past Surgical History  Procedure Date  . Coronary artery bypass graft 1990    LIMA to the LAD and saphenous vein graft tothe diagonal    History   Social History  . Marital Status: Married    Spouse Name: N/A    Number of Children: 3  . Years of Education: N/A   Occupational History  . Retired personnel director    Social History Main Topics  . Smoking status: Never Smoker   . Smokeless tobacco: Not on file  . Alcohol Use: No  . Drug Use: No  . Sexually Active: Not on file     Other Topics Concern  . Not on file   Social History Narrative  . No narrative on file    ROS: no fevers or chills, productive cough, hemoptysis, dysphasia, odynophagia, melena, hematochezia, dysuria, hematuria, rash, seizure activity, orthopnea, PND, pedal edema, claudication. Remaining systems are negative.  Physical Exam: Well-developed well-nourished in no acute distress.  Skin is warm and dry.  HEENT is normal.  Neck is supple.  Chest is clear to auscultation with normal expansion. Previous sternotomy. Cardiovascular exam is regular rate and rhythm.  Abdominal exam nontender or distended. No masses palpated. Extremities show no edema. neuro grossly intact  ECG sinus rhythm at a rate of 64. Axis normal. RV conduction delay. No significant ST changes.    

## 2011-09-17 NOTE — Assessment & Plan Note (Signed)
Resume Coumadin following cardiac catheterization.

## 2011-09-17 NOTE — Assessment & Plan Note (Signed)
Continue statin. 

## 2011-09-17 NOTE — Assessment & Plan Note (Signed)
Patient symptoms are concerning recurrent angina. They also are progressing with less activity. Continue aspirin and statin. Feel cardiac catheterization indicated. The risks and benefits were discussed and the patient agrees to proceed. Hold Coumadin 4 days prior to procedure and resume after.

## 2011-09-17 NOTE — Patient Instructions (Addendum)
Your physician recommends that you HAVE LAB WORK TODAY  A chest x-ray takes a picture of the organs and structures inside the chest, including the heart, lungs, and blood vessels. This test can show several things, including, whether the heart is enlarges; whether fluid is building up in the lungs; and whether pacemaker / defibrillator leads are still in place. AT [address]  Your physician has requested that you have a cardiac catheterization. Cardiac catheterization is used to diagnose and/or treat various heart conditions. Doctors may recommend this procedure for a number of different reasons. The most common reason is to evaluate chest pain. Chest pain can be a symptom of coronary artery disease (CAD), and cardiac catheterization can show whether plaque is narrowing or blocking your heart's arteries. This procedure is also used to evaluate the valves, as well as measure the blood flow and oxygen levels in different parts of your heart. For further information please visit [email or web address] Please follow instruction sheet, as given.

## 2011-09-17 NOTE — Assessment & Plan Note (Signed)
Continue aspirin and statin. 

## 2011-09-17 NOTE — Assessment & Plan Note (Signed)
Continue ACE inhibitor. He has not tolerated beta blockers in the past because of asthma.

## 2011-09-23 ENCOUNTER — Other Ambulatory Visit: Payer: Self-pay | Admitting: Cardiology

## 2011-09-23 ENCOUNTER — Ambulatory Visit (INDEPENDENT_AMBULATORY_CARE_PROVIDER_SITE_OTHER): Payer: Medicare Other | Admitting: *Deleted

## 2011-09-23 DIAGNOSIS — I238 Other current complications following acute myocardial infarction: Secondary | ICD-10-CM | POA: Diagnosis not present

## 2011-09-23 DIAGNOSIS — G459 Transient cerebral ischemic attack, unspecified: Secondary | ICD-10-CM | POA: Diagnosis not present

## 2011-09-23 DIAGNOSIS — I6350 Cerebral infarction due to unspecified occlusion or stenosis of unspecified cerebral artery: Secondary | ICD-10-CM

## 2011-09-23 DIAGNOSIS — I4891 Unspecified atrial fibrillation: Secondary | ICD-10-CM | POA: Diagnosis not present

## 2011-09-23 LAB — POCT INR: INR: 1.3

## 2011-09-24 ENCOUNTER — Inpatient Hospital Stay (HOSPITAL_BASED_OUTPATIENT_CLINIC_OR_DEPARTMENT_OTHER)
Admission: RE | Admit: 2011-09-24 | Discharge: 2011-09-24 | Disposition: A | Discharge status: Home / Self Care | Payer: Medicare Other | Source: Ambulatory Visit | Attending: Cardiovascular Disease | Admitting: Cardiovascular Disease

## 2011-09-24 ENCOUNTER — Encounter (HOSPITAL_BASED_OUTPATIENT_CLINIC_OR_DEPARTMENT_OTHER)
Admission: RE | Disposition: A | Discharge status: Home / Self Care | Payer: Self-pay | Source: Ambulatory Visit | Attending: Cardiovascular Disease

## 2011-09-24 DIAGNOSIS — I2510 Atherosclerotic heart disease of native coronary artery without angina pectoris: Secondary | ICD-10-CM

## 2011-09-24 DIAGNOSIS — I2589 Other forms of chronic ischemic heart disease: Secondary | ICD-10-CM | POA: Diagnosis not present

## 2011-09-24 DIAGNOSIS — Z8673 Personal history of transient ischemic attack (TIA), and cerebral infarction without residual deficits: Secondary | ICD-10-CM | POA: Diagnosis not present

## 2011-09-24 DIAGNOSIS — I25810 Atherosclerosis of coronary artery bypass graft(s) without angina pectoris: Secondary | ICD-10-CM | POA: Insufficient documentation

## 2011-09-24 DIAGNOSIS — I359 Nonrheumatic aortic valve disorder, unspecified: Secondary | ICD-10-CM | POA: Diagnosis not present

## 2011-09-24 SURGERY — JV LEFT HEART CATHETERIZATION WITH CORONARY ANGIOGRAM
Anesthesia: Moderate Sedation

## 2011-09-24 MED ORDER — SODIUM CHLORIDE 0.9 % IV SOLN: INTRAVENOUS | Status: AC

## 2011-09-24 MED ORDER — SODIUM CHLORIDE 0.9 % IV SOLN: 1.0000 mL/kg/h | INTRAVENOUS | Status: DC

## 2011-09-24 MED ORDER — SODIUM CHLORIDE 0.9 % IJ SOLN: 3.0000 mL | INTRAMUSCULAR | Status: DC | PRN

## 2011-09-24 MED ORDER — ACETAMINOPHEN 325 MG PO TABS: 650.0000 mg | ORAL_TABLET | ORAL | Status: DC | PRN

## 2011-09-24 MED ORDER — ASPIRIN 81 MG PO CHEW
324.0000 mg | CHEWABLE_TABLET | ORAL | Status: AC
  Administered 2011-09-24: 324 mg via ORAL

## 2011-09-24 MED ORDER — DIAZEPAM 5 MG PO TABS
5.0000 mg | ORAL_TABLET | ORAL | Status: AC
  Administered 2011-09-24: 5 mg via ORAL

## 2011-09-24 MED ORDER — ASPIRIN EC 325 MG PO TBEC: 325.0000 mg | DELAYED_RELEASE_TABLET | Freq: Every day | ORAL | Status: DC

## 2011-09-24 MED ORDER — DIAZEPAM 2 MG PO TABS: 2.0000 mg | ORAL_TABLET | ORAL | Status: DC

## 2011-09-24 MED ORDER — SODIUM CHLORIDE 0.9 % IV SOLN: 250.0000 mL | INTRAVENOUS | Status: DC | PRN

## 2011-09-24 MED ORDER — SODIUM CHLORIDE 0.9 % IJ SOLN: 3.0000 mL | Freq: Two times a day (BID) | INTRAMUSCULAR | Status: DC

## 2011-09-24 MED ORDER — ONDANSETRON HCL 4 MG/2ML IJ SOLN: 4.0000 mg | Freq: Four times a day (QID) | INTRAMUSCULAR | Status: DC | PRN

## 2011-09-24 MED ORDER — SODIUM CHLORIDE 0.9 % IV SOLN
1.0000 mL/kg/h | INTRAVENOUS | Status: DC
  Administered 2011-09-24: 1 mL/kg/h via INTRAVENOUS

## 2011-09-24 NOTE — Interval H&P Note (Signed)
History and Physical Interval Note:  09/24/2011 8:16 AM  Donald [name]  has presented today for surgery, with the diagnosis of cp  The various methods of treatment have been discussed with the patient and family. After consideration of risks, benefits and other options for treatment, the patient has consented to  Procedure(s) (LRB): JV LEFT HEART CATHETERIZATION WITH CORONARY ANGIOGRAM (N/A) as a surgical intervention .  The patient's history has been reviewed, patient examined, no change in status, stable for surgery.  I have reviewed the patients' chart and labs.  Questions were answered to the patient's satisfaction.     Charlton Haws

## 2011-09-24 NOTE — H&P (View-Only) (Signed)
HPI: The patient is a very pleasant gentleman who has a history of coronary artery disease status post coronary bypassing graft in 1990. His most recent Myoview was performed in Sept 2011. The patient's ejection fraction was 43%. There was a previous infarct, but there was no ischemia noted. He also has a history of an apical thrombus as well as a prior CVA felt possibly secondary embolic in nature. Prior abdominal ultrasound in May 2006 showed no aneurysm. Carotid Dopplers in November of 2005 showed 0-39% bilaterally. MRA in October of 2007 showed no obstructive disease. I last saw him in August of 2012. Since then over the past 4-6 months he has noticed increased dyspnea on exertion with walking up hills. There is also chest discomfort that he finds difficult to describe. His symptoms have progressed but do not occur at rest. There is no orthopnea, PND, pedal edema, palpitations or syncope. Note his chest pain is not pleuritic. There are no associated symptoms. They're similar to his symptoms prior to his previous PCI.   Current Outpatient Prescriptions  Medication Sig Dispense Refill  . aspirin 81 MG tablet Take 81 mg by mouth daily.        . Calcium Acetate, Phos Binder, (CALCIUM ACETATE PO) Take 1 tablet by mouth daily.      . Cholecalciferol (CVS VITAMIN D) 2000 UNITS CAPS Take 1 capsule by mouth daily.        . fenofibrate micronized (LOFIBRA) 200 MG capsule Take 1 capsule (200 mg total) by mouth daily.  90 capsule  3  . Fluticasone-Salmeterol (ADVAIR DISKUS) 100-50 MCG/DOSE AEPB Inhale 1 puff into the lungs daily.        Marland Kitchen latanoprost (XALATAN) 0.005 % ophthalmic solution Place 1 drop into the left eye daily.        . Multiple Vitamin (MULTIVITAMIN) capsule Take 1 capsule by mouth daily.        . pravastatin (PRAVACHOL) 20 MG tablet Take 20 mg by mouth at bedtime.        . ramipril (ALTACE) 2.5 MG capsule Take 1 capsule (2.5 mg total) by mouth 2 (two) times daily.  180 capsule  12  .  Tamsulosin HCl (FLOMAX) 0.4 MG CAPS Take 0.4 mg by mouth daily.        Marland Kitchen warfarin (COUMADIN) 5 MG tablet Take as directed by anticoagulation clinic  90 tablet  1     Past Medical History  Diagnosis Date  . Post-infarction apical thrombus   . CAD (coronary artery disease)   . Ischemic cardiomyopathy   . CVA (cerebral infarction)   . Hyperlipidemia   . Asthma     with worsening with beta blockade  . GI bleed     due to doll fully vessel which was clipped  . Glaucoma     excellent cataracts  . Nephrolithiasis     Past Surgical History  Procedure Date  . Coronary artery bypass graft 1990    LIMA to the LAD and saphenous vein graft tothe diagonal    History   Social History  . Marital Status: Married    Spouse Name: N/A    Number of Children: 3  . Years of Education: N/A   Occupational History  . Retired Equities trader    Social History Main Topics  . Smoking status: Never Smoker   . Smokeless tobacco: Not on file  . Alcohol Use: No  . Drug Use: No  . Sexually Active: Not on file  Other Topics Concern  . Not on file   Social History Narrative  . No narrative on file    ROS: no fevers or chills, productive cough, hemoptysis, dysphasia, odynophagia, melena, hematochezia, dysuria, hematuria, rash, seizure activity, orthopnea, PND, pedal edema, claudication. Remaining systems are negative.  Physical Exam: Well-developed well-nourished in no acute distress.  Skin is warm and dry.  HEENT is normal.  Neck is supple.  Chest is clear to auscultation with normal expansion. Previous sternotomy. Cardiovascular exam is regular rate and rhythm.  Abdominal exam nontender or distended. No masses palpated. Extremities show no edema. neuro grossly intact  ECG sinus rhythm at a rate of 64. Axis normal. RV conduction delay. No significant ST changes.

## 2011-09-24 NOTE — Progress Notes (Signed)
Bedrest begins @ 0900.  Tegaderm dressing applied to right groin site by Army Melia.

## 2011-09-24 NOTE — CV Procedure (Signed)
      Catheterization   Indication: Ischemic Cardiomyopathy  Procedure: After informed consent and clinical "time out" the right groin was prepped and draped in a sterile fashion.  A 5Fr sheath was placed in the right femoral artery using seldinger technique and local lidocaine.  Standard JL4, JR4 and angled pigtail catheters were used to engage the coronary arteries.  Coronary arteries were visualized in orthogonal views using caudal and cranial angulation.  LAO aortography  was done using 30* cc of contrast.    Medications:   Versed: 2 mg's  Fentanyl: 0 ug's  Coronary Arteries: Right dominant with no anomalies  LM: 30%  LAD: 100% ostial occlusion  Distal LAD fills via RV branch off of RCA  IM:  95% diffuse subtotal disease with remnant of occluded SVG    Circumflex: 40% mid vessel stenosis   OM1: Large branching artery that is normal  AV groove: normal   RCA: normal with large RV branch supplying distal LAD   PDA: Small vessel no focal stenosis  PLA: possibly occluded the most distal RCA appears to fill from LIMA collaterals   LIMA:  Patent but does not supply distal LAD  Connects via small vessel to ? Distal RCA/PLA SVG: IM is occluded   Aortography:  LAO no patent SVG;s seen mild aortic root dilatation  Hemodynamics:  Aortic Pressure: 83 /48 63  mmHg  LV Pressure: AV not crossed due to LV apical thrombus  Impression:  Native RCA and circumflex with no high grade stenosis.  LIMA does not supply distal LAD but Large RV branch supplies collaterals to LAD Coronary circulation is stable.  [facility]  Resume coumadin tonight.  BP is low and may need meds/ACE adjusted downwards.    Charlton Haws 09/24/2011 8:51 AM

## 2011-10-02 ENCOUNTER — Ambulatory Visit (INDEPENDENT_AMBULATORY_CARE_PROVIDER_SITE_OTHER): Payer: Medicare Other | Admitting: *Deleted

## 2011-10-02 DIAGNOSIS — I4891 Unspecified atrial fibrillation: Secondary | ICD-10-CM | POA: Diagnosis not present

## 2011-10-02 DIAGNOSIS — I238 Other current complications following acute myocardial infarction: Secondary | ICD-10-CM | POA: Diagnosis not present

## 2011-10-02 DIAGNOSIS — G459 Transient cerebral ischemic attack, unspecified: Secondary | ICD-10-CM

## 2011-10-02 DIAGNOSIS — I6350 Cerebral infarction due to unspecified occlusion or stenosis of unspecified cerebral artery: Secondary | ICD-10-CM

## 2011-10-02 LAB — POCT INR: INR: 2

## 2011-10-05 ENCOUNTER — Encounter: Payer: Self-pay | Admitting: Physician Assistant

## 2011-10-05 ENCOUNTER — Ambulatory Visit (INDEPENDENT_AMBULATORY_CARE_PROVIDER_SITE_OTHER): Payer: Medicare Other | Admitting: Physician Assistant

## 2011-10-05 VITALS — BP 106/60 | HR 74 | Ht 67.0 in | Wt 183.0 lb

## 2011-10-05 DIAGNOSIS — I428 Other cardiomyopathies: Secondary | ICD-10-CM

## 2011-10-05 DIAGNOSIS — I238 Other current complications following acute myocardial infarction: Secondary | ICD-10-CM

## 2011-10-05 DIAGNOSIS — R0600 Dyspnea, unspecified: Secondary | ICD-10-CM

## 2011-10-05 DIAGNOSIS — I4891 Unspecified atrial fibrillation: Secondary | ICD-10-CM | POA: Diagnosis not present

## 2011-10-05 DIAGNOSIS — I2510 Atherosclerotic heart disease of native coronary artery without angina pectoris: Secondary | ICD-10-CM | POA: Diagnosis not present

## 2011-10-05 DIAGNOSIS — R0609 Other forms of dyspnea: Secondary | ICD-10-CM

## 2011-10-05 NOTE — Patient Instructions (Addendum)
Your physician wants you to follow-up in: NEXT June WITH DR. CRENSHAW. You will receive a reminder letter in the mail two months in advance. If you don't receive a letter, please call our office to schedule the follow-up appointment.

## 2011-10-05 NOTE — Progress Notes (Signed)
[address] [place], [place]  [address] Phone: [phone] Fax:  [phone]  Date:  10/05/2011   Name:  Donald [name]   DOB:  Mar 20, 1927   MRN:  [id number]  PCP:  Gaspar Garbe, MD  Primary Cardiologist:  Dr. Olga Millers  Primary Electrophysiologist:  None    History of Present Illness: Donald [name] is a 78 y.o. male who returns for post cath follow up.  He has a history of coronary artery disease status post coronary bypassing graft in 1990, ischemic cardiomyopathy, prior stroke and prior mural apical thrombus on chronic coumadin Rx, asthma.  Echo in 10/07: EF 40-45%, mid to dist septal AK, periapical AK, mild MR, mild to mod TR, mild RAE.  Last Myoview 11/2009:  EF 43%, previous infarct, but there was no ischemia noted.  He also has a history of an apical thrombus as well as a prior CVA felt possibly secondary embolic in nature.  Prior abdominal ultrasound in May 2006 showed no aneurysm. Carotid Dopplers in November of 2005 showed 0-39% bilaterally. MRA in October of 2007 showed no obstructive disease.   Patient saw Dr. Jens Som in 6/13 and c/o increased DOE and chest discomfort.  He was set up for cardiac cath.  LHC 09/24/11:  LM 305, oLAD 100%, dLAD fills via RV branch off RCA, IM diffuse 95% subtotal disease, mCFX 40%, OM1 normal, RCA normal with large RV branch supplying dLAD, PLA possibly occluded and most dRCA appears to fill from LIMA collats; LIMA patent but does no supply dLAD (connects via small vessel to ? DRCA/PLA, S-IM occluded.  Circulation felt to be stable.  Continue med Rx recommended.  BP noted to be low.    He increased his Advair back to bid dosing.  This helped his DOE and he feels much better.  He also noted episodes of dizziness.  BP at home was dropping into the 60s systolic.  He cut his Altace back to QD and he feels much better now.  Denies chest pain, syncope, orthopnea, PND or edema.    Wt Readings from Last 3 Encounters:    10/05/11 183 lb (83.008 kg)  09/24/11 179 lb (81.194 kg)  09/24/11 179 lb (81.194 kg)     Potassium  Date/Time Value Range Status  09/17/2011 11:15 AM 4.3  3.5 - 5.1 mEq/L Final     Creatinine, Ser  Date/Time Value Range Status  09/17/2011 11:15 AM 0.9  0.4 - 1.5 mg/dL Final   Hemoglobin  Date/Time Value Range Status  09/17/2011 11:15 AM 13.1  13.0 - 17.0 g/dL Final    Past Medical History  Diagnosis Date  . Post-infarction apical thrombus   . CAD (coronary artery disease)   . Ischemic cardiomyopathy   . CVA (cerebral infarction)   . Hyperlipidemia   . Asthma     with worsening with beta blockade  . GI bleed     due to doll fully vessel which was clipped  . Glaucoma     excellent cataracts  . Nephrolithiasis     Current Outpatient Prescriptions  Medication Sig Dispense Refill  . aspirin 81 MG tablet Take 81 mg by mouth daily.        . Calcium Acetate, Phos Binder, (CALCIUM ACETATE PO) Take 1 tablet by mouth daily.      . Cholecalciferol (CVS VITAMIN D) 2000 UNITS CAPS Take 1 capsule by mouth daily.        . fenofibrate micronized (LOFIBRA) 200 MG  capsule Take 1 capsule (200 mg total) by mouth daily.  90 capsule  3  . Fluticasone-Salmeterol (ADVAIR DISKUS) 100-50 MCG/DOSE AEPB Inhale 1 puff into the lungs daily.        Marland Kitchen latanoprost (XALATAN) 0.005 % ophthalmic solution Place 1 drop into the left eye daily.        . Multiple Vitamin (MULTIVITAMIN) capsule Take 1 capsule by mouth daily.        . pravastatin (PRAVACHOL) 20 MG tablet Take 20 mg by mouth at bedtime.        . ramipril (ALTACE) 2.5 MG capsule Take 1 capsule (2.5 mg total) by mouth 2 (two) times daily.  180 capsule  12  . Tamsulosin HCl (FLOMAX) 0.4 MG CAPS Take 0.4 mg by mouth daily.        Marland Kitchen warfarin (COUMADIN) 5 MG tablet Take as directed by anticoagulation clinic  90 tablet  1    Allergies: Allergies  Allergen Reactions  . Morphine     History  Substance Use Topics  . Smoking status: Never Smoker    . Smokeless tobacco: Never Used  . Alcohol Use: No     PHYSICAL EXAM: VS:  BP 106/60  Pulse 74  Ht 5\' 7"  (1.702 m)  Wt 183 lb (83.008 kg)  BMI 28.66 kg/m2 Well nourished, well developed, in no acute distress HEENT: normal Neck: no JVD Cardiac:  normal S1, S2; RRR; no murmur Lungs:  clear to auscultation bilaterally, no wheezing, rhonchi or rales Abd: soft, nontender, no hepatomegaly Ext: no edema; right groin without hematoma or bruit  Skin: warm and dry Neuro:  CNs 2-12 intact, no focal abnormalities noted  EKG:  NSR, HR 76, normal axis, low voltage, no change from prior      ASSESSMENT AND PLAN:  1.  Dyspnea Resolved.  Likely related to Asthma as symptoms improved with increasing Advair dose.  2.  Coronary Artery Disease Anatomy stable by recent LHC. Continue ASA and statin. Follow up with Dr. Olga Millers in 08/2012.  3.  Ischemic Cardiomyopathy Volume stable. Intolerant to beta blockers due to asthma. BP was running low but now improved with once daily dosing of Altace. Continue current Rx.  4.  Prior Stroke in setting of Mural Apical Thrombus He is back on coumadin and followed in the coumadin clinic.    Signed, Tereso Newcomer, PA-C  9:16 AM 10/05/2011

## 2011-10-30 ENCOUNTER — Ambulatory Visit (INDEPENDENT_AMBULATORY_CARE_PROVIDER_SITE_OTHER): Payer: Medicare Other | Admitting: *Deleted

## 2011-10-30 DIAGNOSIS — I4891 Unspecified atrial fibrillation: Secondary | ICD-10-CM | POA: Diagnosis not present

## 2011-10-30 DIAGNOSIS — I6350 Cerebral infarction due to unspecified occlusion or stenosis of unspecified cerebral artery: Secondary | ICD-10-CM

## 2011-10-30 DIAGNOSIS — I238 Other current complications following acute myocardial infarction: Secondary | ICD-10-CM | POA: Diagnosis not present

## 2011-10-30 DIAGNOSIS — G459 Transient cerebral ischemic attack, unspecified: Secondary | ICD-10-CM

## 2011-10-30 LAB — POCT INR: INR: 2.4

## 2011-11-12 DIAGNOSIS — R1084 Generalized abdominal pain: Secondary | ICD-10-CM | POA: Diagnosis not present

## 2011-11-12 DIAGNOSIS — N200 Calculus of kidney: Secondary | ICD-10-CM | POA: Diagnosis not present

## 2011-11-12 DIAGNOSIS — M545 Low back pain: Secondary | ICD-10-CM | POA: Diagnosis not present

## 2011-11-12 DIAGNOSIS — C61 Malignant neoplasm of prostate: Secondary | ICD-10-CM | POA: Diagnosis not present

## 2011-11-16 DIAGNOSIS — B029 Zoster without complications: Secondary | ICD-10-CM | POA: Diagnosis not present

## 2011-12-04 ENCOUNTER — Ambulatory Visit (INDEPENDENT_AMBULATORY_CARE_PROVIDER_SITE_OTHER): Payer: Medicare Other | Admitting: *Deleted

## 2011-12-04 DIAGNOSIS — I6350 Cerebral infarction due to unspecified occlusion or stenosis of unspecified cerebral artery: Secondary | ICD-10-CM | POA: Diagnosis not present

## 2011-12-04 DIAGNOSIS — I238 Other current complications following acute myocardial infarction: Secondary | ICD-10-CM

## 2011-12-04 DIAGNOSIS — I4891 Unspecified atrial fibrillation: Secondary | ICD-10-CM | POA: Diagnosis not present

## 2011-12-04 DIAGNOSIS — G459 Transient cerebral ischemic attack, unspecified: Secondary | ICD-10-CM | POA: Diagnosis not present

## 2011-12-18 DIAGNOSIS — C61 Malignant neoplasm of prostate: Secondary | ICD-10-CM | POA: Diagnosis not present

## 2011-12-21 ENCOUNTER — Ambulatory Visit (INDEPENDENT_AMBULATORY_CARE_PROVIDER_SITE_OTHER): Payer: Medicare Other | Admitting: *Deleted

## 2011-12-21 DIAGNOSIS — G459 Transient cerebral ischemic attack, unspecified: Secondary | ICD-10-CM | POA: Diagnosis not present

## 2011-12-21 DIAGNOSIS — I6350 Cerebral infarction due to unspecified occlusion or stenosis of unspecified cerebral artery: Secondary | ICD-10-CM

## 2011-12-21 DIAGNOSIS — I238 Other current complications following acute myocardial infarction: Secondary | ICD-10-CM | POA: Diagnosis not present

## 2011-12-21 DIAGNOSIS — I4891 Unspecified atrial fibrillation: Secondary | ICD-10-CM | POA: Diagnosis not present

## 2011-12-21 LAB — POCT INR: INR: 3.5

## 2011-12-31 DIAGNOSIS — R310 Gross hematuria: Secondary | ICD-10-CM | POA: Diagnosis not present

## 2011-12-31 DIAGNOSIS — Z8546 Personal history of malignant neoplasm of prostate: Secondary | ICD-10-CM | POA: Diagnosis not present

## 2012-01-04 ENCOUNTER — Ambulatory Visit (INDEPENDENT_AMBULATORY_CARE_PROVIDER_SITE_OTHER): Payer: Medicare Other

## 2012-01-04 DIAGNOSIS — G459 Transient cerebral ischemic attack, unspecified: Secondary | ICD-10-CM

## 2012-01-04 DIAGNOSIS — I238 Other current complications following acute myocardial infarction: Secondary | ICD-10-CM

## 2012-01-04 DIAGNOSIS — I4891 Unspecified atrial fibrillation: Secondary | ICD-10-CM | POA: Diagnosis not present

## 2012-01-04 DIAGNOSIS — I6350 Cerebral infarction due to unspecified occlusion or stenosis of unspecified cerebral artery: Secondary | ICD-10-CM | POA: Diagnosis not present

## 2012-01-04 LAB — POCT INR: INR: 2.4

## 2012-01-11 DIAGNOSIS — H4011X0 Primary open-angle glaucoma, stage unspecified: Secondary | ICD-10-CM | POA: Diagnosis not present

## 2012-01-11 DIAGNOSIS — H5315 Visual distortions of shape and size: Secondary | ICD-10-CM | POA: Diagnosis not present

## 2012-01-11 DIAGNOSIS — Z961 Presence of intraocular lens: Secondary | ICD-10-CM | POA: Diagnosis not present

## 2012-01-11 DIAGNOSIS — H409 Unspecified glaucoma: Secondary | ICD-10-CM | POA: Diagnosis not present

## 2012-01-13 DIAGNOSIS — C44221 Squamous cell carcinoma of skin of unspecified ear and external auricular canal: Secondary | ICD-10-CM | POA: Diagnosis not present

## 2012-02-01 ENCOUNTER — Ambulatory Visit (INDEPENDENT_AMBULATORY_CARE_PROVIDER_SITE_OTHER): Payer: Medicare Other | Admitting: *Deleted

## 2012-02-01 DIAGNOSIS — I238 Other current complications following acute myocardial infarction: Secondary | ICD-10-CM

## 2012-02-01 DIAGNOSIS — I6350 Cerebral infarction due to unspecified occlusion or stenosis of unspecified cerebral artery: Secondary | ICD-10-CM

## 2012-02-01 DIAGNOSIS — I4891 Unspecified atrial fibrillation: Secondary | ICD-10-CM | POA: Diagnosis not present

## 2012-02-01 DIAGNOSIS — G459 Transient cerebral ischemic attack, unspecified: Secondary | ICD-10-CM | POA: Diagnosis not present

## 2012-02-01 LAB — POCT INR: INR: 1.9

## 2012-02-04 ENCOUNTER — Other Ambulatory Visit: Payer: Self-pay | Admitting: *Deleted

## 2012-02-04 MED ORDER — WARFARIN SODIUM 5 MG PO TABS: ORAL_TABLET | ORAL | Status: DC

## 2012-02-10 DIAGNOSIS — L570 Actinic keratosis: Secondary | ICD-10-CM | POA: Diagnosis not present

## 2012-02-10 DIAGNOSIS — Z85828 Personal history of other malignant neoplasm of skin: Secondary | ICD-10-CM | POA: Diagnosis not present

## 2012-02-22 ENCOUNTER — Ambulatory Visit (INDEPENDENT_AMBULATORY_CARE_PROVIDER_SITE_OTHER): Payer: Medicare Other

## 2012-02-22 DIAGNOSIS — I4891 Unspecified atrial fibrillation: Secondary | ICD-10-CM | POA: Diagnosis not present

## 2012-02-22 DIAGNOSIS — I238 Other current complications following acute myocardial infarction: Secondary | ICD-10-CM | POA: Diagnosis not present

## 2012-02-22 DIAGNOSIS — G459 Transient cerebral ischemic attack, unspecified: Secondary | ICD-10-CM

## 2012-02-22 DIAGNOSIS — I6350 Cerebral infarction due to unspecified occlusion or stenosis of unspecified cerebral artery: Secondary | ICD-10-CM

## 2012-03-04 DIAGNOSIS — M949 Disorder of cartilage, unspecified: Secondary | ICD-10-CM | POA: Diagnosis not present

## 2012-03-04 DIAGNOSIS — I509 Heart failure, unspecified: Secondary | ICD-10-CM | POA: Diagnosis not present

## 2012-03-04 DIAGNOSIS — I679 Cerebrovascular disease, unspecified: Secondary | ICD-10-CM | POA: Diagnosis not present

## 2012-03-04 DIAGNOSIS — M899 Disorder of bone, unspecified: Secondary | ICD-10-CM | POA: Diagnosis not present

## 2012-03-04 DIAGNOSIS — J449 Chronic obstructive pulmonary disease, unspecified: Secondary | ICD-10-CM | POA: Diagnosis not present

## 2012-03-28 ENCOUNTER — Ambulatory Visit (INDEPENDENT_AMBULATORY_CARE_PROVIDER_SITE_OTHER): Payer: Medicare Other | Admitting: *Deleted

## 2012-03-28 DIAGNOSIS — I4891 Unspecified atrial fibrillation: Secondary | ICD-10-CM

## 2012-03-28 DIAGNOSIS — I6350 Cerebral infarction due to unspecified occlusion or stenosis of unspecified cerebral artery: Secondary | ICD-10-CM | POA: Diagnosis not present

## 2012-03-28 DIAGNOSIS — I238 Other current complications following acute myocardial infarction: Secondary | ICD-10-CM

## 2012-03-28 DIAGNOSIS — G459 Transient cerebral ischemic attack, unspecified: Secondary | ICD-10-CM | POA: Diagnosis not present

## 2012-03-28 LAB — POCT INR: INR: 3.6

## 2012-04-19 ENCOUNTER — Ambulatory Visit (INDEPENDENT_AMBULATORY_CARE_PROVIDER_SITE_OTHER): Payer: Medicare Other | Admitting: *Deleted

## 2012-04-19 DIAGNOSIS — G459 Transient cerebral ischemic attack, unspecified: Secondary | ICD-10-CM | POA: Diagnosis not present

## 2012-04-19 DIAGNOSIS — I6350 Cerebral infarction due to unspecified occlusion or stenosis of unspecified cerebral artery: Secondary | ICD-10-CM

## 2012-04-19 DIAGNOSIS — I4891 Unspecified atrial fibrillation: Secondary | ICD-10-CM | POA: Diagnosis not present

## 2012-04-19 DIAGNOSIS — I238 Other current complications following acute myocardial infarction: Secondary | ICD-10-CM

## 2012-04-19 LAB — POCT INR: INR: 2.4

## 2012-05-18 DIAGNOSIS — L570 Actinic keratosis: Secondary | ICD-10-CM | POA: Diagnosis not present

## 2012-05-18 DIAGNOSIS — Z85828 Personal history of other malignant neoplasm of skin: Secondary | ICD-10-CM | POA: Diagnosis not present

## 2012-05-19 ENCOUNTER — Ambulatory Visit (INDEPENDENT_AMBULATORY_CARE_PROVIDER_SITE_OTHER): Payer: Medicare Other

## 2012-05-19 DIAGNOSIS — G459 Transient cerebral ischemic attack, unspecified: Secondary | ICD-10-CM

## 2012-05-19 DIAGNOSIS — I4891 Unspecified atrial fibrillation: Secondary | ICD-10-CM

## 2012-05-19 DIAGNOSIS — I6350 Cerebral infarction due to unspecified occlusion or stenosis of unspecified cerebral artery: Secondary | ICD-10-CM | POA: Diagnosis not present

## 2012-05-19 DIAGNOSIS — I238 Other current complications following acute myocardial infarction: Secondary | ICD-10-CM | POA: Diagnosis not present

## 2012-05-26 ENCOUNTER — Encounter: Payer: Self-pay | Admitting: *Deleted

## 2012-06-16 ENCOUNTER — Ambulatory Visit (INDEPENDENT_AMBULATORY_CARE_PROVIDER_SITE_OTHER): Payer: Medicare Other | Admitting: *Deleted

## 2012-06-16 DIAGNOSIS — I4891 Unspecified atrial fibrillation: Secondary | ICD-10-CM | POA: Diagnosis not present

## 2012-06-16 DIAGNOSIS — I238 Other current complications following acute myocardial infarction: Secondary | ICD-10-CM | POA: Diagnosis not present

## 2012-06-16 DIAGNOSIS — G459 Transient cerebral ischemic attack, unspecified: Secondary | ICD-10-CM | POA: Diagnosis not present

## 2012-06-16 DIAGNOSIS — I6350 Cerebral infarction due to unspecified occlusion or stenosis of unspecified cerebral artery: Secondary | ICD-10-CM | POA: Diagnosis not present

## 2012-06-16 LAB — POCT INR: INR: 1.9

## 2012-06-21 DIAGNOSIS — J449 Chronic obstructive pulmonary disease, unspecified: Secondary | ICD-10-CM | POA: Diagnosis not present

## 2012-06-21 DIAGNOSIS — R05 Cough: Secondary | ICD-10-CM | POA: Diagnosis not present

## 2012-06-30 DIAGNOSIS — N200 Calculus of kidney: Secondary | ICD-10-CM | POA: Diagnosis not present

## 2012-06-30 DIAGNOSIS — Z8546 Personal history of malignant neoplasm of prostate: Secondary | ICD-10-CM | POA: Diagnosis not present

## 2012-06-30 DIAGNOSIS — R3129 Other microscopic hematuria: Secondary | ICD-10-CM | POA: Diagnosis not present

## 2012-07-14 ENCOUNTER — Ambulatory Visit (INDEPENDENT_AMBULATORY_CARE_PROVIDER_SITE_OTHER): Payer: Medicare Other | Admitting: *Deleted

## 2012-07-14 DIAGNOSIS — I4891 Unspecified atrial fibrillation: Secondary | ICD-10-CM

## 2012-07-14 DIAGNOSIS — I238 Other current complications following acute myocardial infarction: Secondary | ICD-10-CM

## 2012-07-14 DIAGNOSIS — H4011X0 Primary open-angle glaucoma, stage unspecified: Secondary | ICD-10-CM | POA: Diagnosis not present

## 2012-07-14 DIAGNOSIS — I6350 Cerebral infarction due to unspecified occlusion or stenosis of unspecified cerebral artery: Secondary | ICD-10-CM

## 2012-07-14 DIAGNOSIS — G459 Transient cerebral ischemic attack, unspecified: Secondary | ICD-10-CM

## 2012-07-14 DIAGNOSIS — H409 Unspecified glaucoma: Secondary | ICD-10-CM | POA: Diagnosis not present

## 2012-07-14 DIAGNOSIS — H40059 Ocular hypertension, unspecified eye: Secondary | ICD-10-CM | POA: Diagnosis not present

## 2012-07-14 DIAGNOSIS — H4720 Unspecified optic atrophy: Secondary | ICD-10-CM | POA: Diagnosis not present

## 2012-07-14 LAB — POCT INR: INR: 2.7

## 2012-08-09 ENCOUNTER — Ambulatory Visit (INDEPENDENT_AMBULATORY_CARE_PROVIDER_SITE_OTHER): Payer: Medicare Other | Admitting: *Deleted

## 2012-08-09 DIAGNOSIS — I238 Other current complications following acute myocardial infarction: Secondary | ICD-10-CM | POA: Diagnosis not present

## 2012-08-09 DIAGNOSIS — I4891 Unspecified atrial fibrillation: Secondary | ICD-10-CM | POA: Diagnosis not present

## 2012-08-09 DIAGNOSIS — I6350 Cerebral infarction due to unspecified occlusion or stenosis of unspecified cerebral artery: Secondary | ICD-10-CM

## 2012-08-09 DIAGNOSIS — G459 Transient cerebral ischemic attack, unspecified: Secondary | ICD-10-CM | POA: Diagnosis not present

## 2012-08-09 LAB — POCT INR: INR: 2.5

## 2012-08-18 ENCOUNTER — Other Ambulatory Visit: Payer: Self-pay | Admitting: *Deleted

## 2012-08-18 MED ORDER — WARFARIN SODIUM 5 MG PO TABS: ORAL_TABLET | ORAL | Status: DC

## 2012-08-29 DIAGNOSIS — C61 Malignant neoplasm of prostate: Secondary | ICD-10-CM | POA: Diagnosis not present

## 2012-08-29 DIAGNOSIS — I259 Chronic ischemic heart disease, unspecified: Secondary | ICD-10-CM | POA: Diagnosis not present

## 2012-08-29 DIAGNOSIS — M899 Disorder of bone, unspecified: Secondary | ICD-10-CM | POA: Diagnosis not present

## 2012-08-29 DIAGNOSIS — Z79899 Other long term (current) drug therapy: Secondary | ICD-10-CM | POA: Diagnosis not present

## 2012-09-05 DIAGNOSIS — M1990 Unspecified osteoarthritis, unspecified site: Secondary | ICD-10-CM | POA: Diagnosis not present

## 2012-09-05 DIAGNOSIS — C61 Malignant neoplasm of prostate: Secondary | ICD-10-CM | POA: Diagnosis not present

## 2012-09-05 DIAGNOSIS — Z0000 Encounter for general adult medical examination without abnormal findings: Secondary | ICD-10-CM | POA: Diagnosis not present

## 2012-09-05 DIAGNOSIS — I509 Heart failure, unspecified: Secondary | ICD-10-CM | POA: Diagnosis not present

## 2012-09-05 DIAGNOSIS — I679 Cerebrovascular disease, unspecified: Secondary | ICD-10-CM | POA: Diagnosis not present

## 2012-09-05 DIAGNOSIS — M949 Disorder of cartilage, unspecified: Secondary | ICD-10-CM | POA: Diagnosis not present

## 2012-09-05 DIAGNOSIS — I951 Orthostatic hypotension: Secondary | ICD-10-CM | POA: Diagnosis not present

## 2012-09-05 DIAGNOSIS — I259 Chronic ischemic heart disease, unspecified: Secondary | ICD-10-CM | POA: Diagnosis not present

## 2012-09-05 DIAGNOSIS — M899 Disorder of bone, unspecified: Secondary | ICD-10-CM | POA: Diagnosis not present

## 2012-09-07 DIAGNOSIS — Z1212 Encounter for screening for malignant neoplasm of rectum: Secondary | ICD-10-CM | POA: Diagnosis not present

## 2012-09-08 ENCOUNTER — Ambulatory Visit (INDEPENDENT_AMBULATORY_CARE_PROVIDER_SITE_OTHER): Payer: Medicare Other

## 2012-09-08 ENCOUNTER — Other Ambulatory Visit: Payer: Self-pay | Admitting: *Deleted

## 2012-09-08 DIAGNOSIS — G459 Transient cerebral ischemic attack, unspecified: Secondary | ICD-10-CM | POA: Diagnosis not present

## 2012-09-08 DIAGNOSIS — I6350 Cerebral infarction due to unspecified occlusion or stenosis of unspecified cerebral artery: Secondary | ICD-10-CM

## 2012-09-08 DIAGNOSIS — I4891 Unspecified atrial fibrillation: Secondary | ICD-10-CM

## 2012-09-08 DIAGNOSIS — I238 Other current complications following acute myocardial infarction: Secondary | ICD-10-CM | POA: Diagnosis not present

## 2012-09-08 MED ORDER — FENOFIBRATE MICRONIZED 200 MG PO CAPS: 200.0000 mg | ORAL_CAPSULE | Freq: Every day | ORAL | Status: DC

## 2012-10-04 DIAGNOSIS — Z85828 Personal history of other malignant neoplasm of skin: Secondary | ICD-10-CM | POA: Diagnosis not present

## 2012-10-04 DIAGNOSIS — L570 Actinic keratosis: Secondary | ICD-10-CM | POA: Diagnosis not present

## 2012-10-20 ENCOUNTER — Encounter: Payer: Self-pay | Admitting: *Deleted

## 2012-10-21 ENCOUNTER — Encounter: Payer: Self-pay | Admitting: Cardiology

## 2012-10-21 ENCOUNTER — Ambulatory Visit (INDEPENDENT_AMBULATORY_CARE_PROVIDER_SITE_OTHER): Payer: Medicare Other | Admitting: *Deleted

## 2012-10-21 ENCOUNTER — Ambulatory Visit (INDEPENDENT_AMBULATORY_CARE_PROVIDER_SITE_OTHER): Payer: Medicare Other | Admitting: Cardiology

## 2012-10-21 VITALS — BP 110/68 | HR 70 | Wt 198.0 lb

## 2012-10-21 DIAGNOSIS — I6350 Cerebral infarction due to unspecified occlusion or stenosis of unspecified cerebral artery: Secondary | ICD-10-CM | POA: Diagnosis not present

## 2012-10-21 DIAGNOSIS — I2510 Atherosclerotic heart disease of native coronary artery without angina pectoris: Secondary | ICD-10-CM | POA: Diagnosis not present

## 2012-10-21 DIAGNOSIS — I4891 Unspecified atrial fibrillation: Secondary | ICD-10-CM | POA: Diagnosis not present

## 2012-10-21 DIAGNOSIS — I238 Other current complications following acute myocardial infarction: Secondary | ICD-10-CM | POA: Diagnosis not present

## 2012-10-21 DIAGNOSIS — G459 Transient cerebral ischemic attack, unspecified: Secondary | ICD-10-CM | POA: Diagnosis not present

## 2012-10-21 MED ORDER — PRAVASTATIN SODIUM 40 MG PO TABS: 40.0000 mg | ORAL_TABLET | Freq: Every day | ORAL | Status: DC

## 2012-10-21 NOTE — Assessment & Plan Note (Signed)
Given recent guidelines I will increase his Pravachol to 40 mg daily. Check lipids and liver in 6 weeks.

## 2012-10-21 NOTE — Assessment & Plan Note (Signed)
Given history of mural thrombus at the apex and TIA continue Coumadin. Check hemoglobin.

## 2012-10-21 NOTE — Progress Notes (Signed)
HPI: Pleasant male for fu of CAD. He is status post coronary bypassing graft in 1990; also with h/o ischemic cardiomyopathy, prior stroke and prior mural apical thrombus on chronic coumadin Rx. Prior abdominal ultrasound in May 2006 showed no aneurysm. Carotid Dopplers in November of 2005 showed 0-39% bilaterally. MRA in October of 2007 showed no obstructive disease. Echo in 10/07: EF 40-45%, mid to dist septal AK, periapical AK, mild MR, mild to mod TR, mild RAE. LHC 09/24/11: LM 30, oLAD 100%, dLAD fills via RV branch off RCA, IM diffuse 95% subtotal disease, mCFX 40%, OM1 normal, RCA normal with large RV branch supplying dLAD, PLA possibly occluded and most dRCA appears to fill from LIMA collats; LIMA patent but does not supply dLAD (connects via small vessel to ? DRCA/PLA, S-IM occluded). Circulation felt to be stable. Continue med Rx recommended. Patient last seen 7/13. Since then,  the patient denies any dyspnea on exertion, orthopnea, PND, pedal edema, palpitations, syncope or chest pain.    Current Outpatient Prescriptions  Medication Sig Dispense Refill  . Calcium Acetate, Phos Binder, (CALCIUM ACETATE PO) Take 1 tablet by mouth daily.      . Cholecalciferol (CVS VITAMIN D) 2000 UNITS CAPS Take 1 capsule by mouth daily.        . fenofibrate micronized (LOFIBRA) 200 MG capsule Take 1 capsule (200 mg total) by mouth daily.  90 capsule  3  . Fluticasone-Salmeterol (ADVAIR DISKUS) 100-50 MCG/DOSE AEPB Inhale 1 puff into the lungs daily.        Marland Kitchen latanoprost (XALATAN) 0.005 % ophthalmic solution Place 1 drop into the left eye daily.        . Multiple Vitamin (MULTIVITAMIN) capsule Take 1 capsule by mouth daily.        . pravastatin (PRAVACHOL) 20 MG tablet Take 20 mg by mouth at bedtime.        . ramipril (ALTACE) 2.5 MG capsule Take 2.5 mg by mouth daily.      . Tamsulosin HCl (FLOMAX) 0.4 MG CAPS Take 0.4 mg by mouth daily.        Marland Kitchen warfarin (COUMADIN) 5 MG tablet Take as directed by  anticoagulation clinic  90 tablet  1   No current facility-administered medications for this visit.     Past Medical History  Diagnosis Date  . Post-infarction apical thrombus   . CAD (coronary artery disease)   . Ischemic cardiomyopathy   . CVA (cerebral infarction)   . Hyperlipidemia   . Asthma     with worsening with beta blockade  . GI bleed     due to doll fully vessel which was clipped  . Glaucoma     excellent cataracts  . Nephrolithiasis     Past Surgical History  Procedure Laterality Date  . Coronary artery bypass graft  1990    LIMA to the LAD and saphenous vein graft tothe diagonal    History   Social History  . Marital Status: Married    Spouse Name: N/A    Number of Children: 3  . Years of Education: N/A   Occupational History  . Retired Equities trader    Social History Main Topics  . Smoking status: Never Smoker   . Smokeless tobacco: Never Used  . Alcohol Use: No  . Drug Use: No  . Sexually Active: Not on file   Other Topics Concern  . Not on file   Social History Narrative  . No narrative on file  ROS: no fevers or chills, productive cough, hemoptysis, dysphasia, odynophagia, melena, hematochezia, dysuria, hematuria, rash, seizure activity, orthopnea, PND, pedal edema, claudication. Remaining systems are negative.  Physical Exam: Well-developed well-nourished in no acute distress.  Skin is warm and dry.  HEENT is normal.  Neck is supple.  Chest is clear to auscultation with normal expansion.  Cardiovascular exam is regular rate and rhythm.  Abdominal exam nontender or distended. No masses palpated. Extremities show no edema. neuro grossly intact  ECG sinus rhythm at a rate of 70. Nonspecific ST changes.

## 2012-10-21 NOTE — Patient Instructions (Addendum)
Your physician wants you to follow-up in: ONE YEAR WITH DR Shelda Pal will receive a reminder letter in the mail two months in advance. If you don't receive a letter, please call our office to schedule the follow-up appointment.   INCREASE PRAVASTATIN TO 40 MG ONCE DAILY  Your physician recommends that you return for lab work in: WITH NEXT COUMADIN CHECK-DO NOT EAT PRIOR TO LAB

## 2012-10-21 NOTE — Assessment & Plan Note (Signed)
Continue statin. Not on aspirin given need for Coumadin. 

## 2012-10-21 NOTE — Assessment & Plan Note (Signed)
Continued ACE inhibitor. Check potassium and renal function. He has not tolerated beta blockers in the past because of significant worsening of his asthma.

## 2012-11-08 DIAGNOSIS — L03019 Cellulitis of unspecified finger: Secondary | ICD-10-CM | POA: Diagnosis not present

## 2012-11-08 DIAGNOSIS — L089 Local infection of the skin and subcutaneous tissue, unspecified: Secondary | ICD-10-CM | POA: Diagnosis not present

## 2012-11-08 DIAGNOSIS — Z6827 Body mass index (BMI) 27.0-27.9, adult: Secondary | ICD-10-CM | POA: Diagnosis not present

## 2012-11-30 ENCOUNTER — Encounter: Payer: Self-pay | Admitting: *Deleted

## 2012-11-30 ENCOUNTER — Other Ambulatory Visit (INDEPENDENT_AMBULATORY_CARE_PROVIDER_SITE_OTHER): Payer: Medicare Other

## 2012-11-30 ENCOUNTER — Ambulatory Visit (INDEPENDENT_AMBULATORY_CARE_PROVIDER_SITE_OTHER): Payer: Medicare Other | Admitting: *Deleted

## 2012-11-30 DIAGNOSIS — I2510 Atherosclerotic heart disease of native coronary artery without angina pectoris: Secondary | ICD-10-CM

## 2012-11-30 DIAGNOSIS — I4891 Unspecified atrial fibrillation: Secondary | ICD-10-CM | POA: Diagnosis not present

## 2012-11-30 DIAGNOSIS — I6350 Cerebral infarction due to unspecified occlusion or stenosis of unspecified cerebral artery: Secondary | ICD-10-CM

## 2012-11-30 DIAGNOSIS — I238 Other current complications following acute myocardial infarction: Secondary | ICD-10-CM

## 2012-11-30 DIAGNOSIS — G459 Transient cerebral ischemic attack, unspecified: Secondary | ICD-10-CM

## 2012-11-30 LAB — CBC WITH DIFFERENTIAL/PLATELET
Basophils Relative: 0.1 % (ref 0.0–3.0)
Eosinophils Absolute: 0.2 10*3/uL (ref 0.0–0.7)
Eosinophils Relative: 4.2 % (ref 0.0–5.0)
HCT: 40.4 % (ref 39.0–52.0)
Hemoglobin: 13.5 g/dL (ref 13.0–17.0)
Lymphs Abs: 1 10*3/uL (ref 0.7–4.0)
MCHC: 33.5 g/dL (ref 30.0–36.0)
MCV: 94.4 fl (ref 78.0–100.0)
Monocytes Absolute: 0.3 10*3/uL (ref 0.1–1.0)
Neutro Abs: 3.1 10*3/uL (ref 1.4–7.7)
Neutrophils Relative %: 66.3 % (ref 43.0–77.0)
RBC: 4.28 Mil/uL (ref 4.22–5.81)
WBC: 4.7 10*3/uL (ref 4.5–10.5)

## 2012-11-30 LAB — HEPATIC FUNCTION PANEL
Bilirubin, Direct: 0 mg/dL (ref 0.0–0.3)
Total Bilirubin: 0.4 mg/dL (ref 0.3–1.2)

## 2012-11-30 LAB — BASIC METABOLIC PANEL
BUN: 22 mg/dL (ref 6–23)
Creatinine, Ser: 0.8 mg/dL (ref 0.4–1.5)
GFR: 92.44 mL/min (ref >60.00)
Glucose, Bld: 96 mg/dL (ref 70–99)
Potassium: 4 mEq/L (ref 3.5–5.1)

## 2012-11-30 LAB — LIPID PANEL
HDL: 47 mg/dL (ref >39.00)
Total CHOL/HDL Ratio: 2
VLDL: 11.4 mg/dL (ref 0.0–40.0)

## 2012-11-30 LAB — POCT INR: INR: 3.1

## 2012-12-29 DIAGNOSIS — Z8546 Personal history of malignant neoplasm of prostate: Secondary | ICD-10-CM | POA: Diagnosis not present

## 2013-01-04 DIAGNOSIS — L570 Actinic keratosis: Secondary | ICD-10-CM | POA: Diagnosis not present

## 2013-01-05 DIAGNOSIS — R39198 Other difficulties with micturition: Secondary | ICD-10-CM | POA: Diagnosis not present

## 2013-01-05 DIAGNOSIS — R350 Frequency of micturition: Secondary | ICD-10-CM | POA: Diagnosis not present

## 2013-01-05 DIAGNOSIS — N200 Calculus of kidney: Secondary | ICD-10-CM | POA: Diagnosis not present

## 2013-01-05 DIAGNOSIS — Z8546 Personal history of malignant neoplasm of prostate: Secondary | ICD-10-CM | POA: Diagnosis not present

## 2013-01-12 DIAGNOSIS — H4011X0 Primary open-angle glaucoma, stage unspecified: Secondary | ICD-10-CM | POA: Diagnosis not present

## 2013-01-12 DIAGNOSIS — Z961 Presence of intraocular lens: Secondary | ICD-10-CM | POA: Diagnosis not present

## 2013-01-12 DIAGNOSIS — H409 Unspecified glaucoma: Secondary | ICD-10-CM | POA: Diagnosis not present

## 2013-01-12 DIAGNOSIS — H01009 Unspecified blepharitis unspecified eye, unspecified eyelid: Secondary | ICD-10-CM | POA: Diagnosis not present

## 2013-01-16 ENCOUNTER — Ambulatory Visit (INDEPENDENT_AMBULATORY_CARE_PROVIDER_SITE_OTHER): Payer: Medicare Other | Admitting: General Practice

## 2013-01-16 DIAGNOSIS — I238 Other current complications following acute myocardial infarction: Secondary | ICD-10-CM

## 2013-01-16 DIAGNOSIS — I6350 Cerebral infarction due to unspecified occlusion or stenosis of unspecified cerebral artery: Secondary | ICD-10-CM | POA: Diagnosis not present

## 2013-01-16 DIAGNOSIS — G459 Transient cerebral ischemic attack, unspecified: Secondary | ICD-10-CM

## 2013-01-16 DIAGNOSIS — I4891 Unspecified atrial fibrillation: Secondary | ICD-10-CM | POA: Diagnosis not present

## 2013-02-09 DIAGNOSIS — N201 Calculus of ureter: Secondary | ICD-10-CM | POA: Diagnosis not present

## 2013-02-09 DIAGNOSIS — R350 Frequency of micturition: Secondary | ICD-10-CM | POA: Diagnosis not present

## 2013-02-09 DIAGNOSIS — N200 Calculus of kidney: Secondary | ICD-10-CM | POA: Diagnosis not present

## 2013-02-16 ENCOUNTER — Other Ambulatory Visit: Payer: Self-pay

## 2013-02-16 MED ORDER — RAMIPRIL 2.5 MG PO CAPS: 2.5000 mg | ORAL_CAPSULE | Freq: Every day | ORAL | Status: DC

## 2013-02-28 ENCOUNTER — Ambulatory Visit (INDEPENDENT_AMBULATORY_CARE_PROVIDER_SITE_OTHER): Payer: Medicare Other | Admitting: General Practice

## 2013-02-28 DIAGNOSIS — G459 Transient cerebral ischemic attack, unspecified: Secondary | ICD-10-CM

## 2013-02-28 DIAGNOSIS — I4891 Unspecified atrial fibrillation: Secondary | ICD-10-CM

## 2013-02-28 DIAGNOSIS — I238 Other current complications following acute myocardial infarction: Secondary | ICD-10-CM

## 2013-02-28 DIAGNOSIS — I6350 Cerebral infarction due to unspecified occlusion or stenosis of unspecified cerebral artery: Secondary | ICD-10-CM | POA: Diagnosis not present

## 2013-03-10 DIAGNOSIS — R351 Nocturia: Secondary | ICD-10-CM | POA: Diagnosis not present

## 2013-03-10 DIAGNOSIS — N201 Calculus of ureter: Secondary | ICD-10-CM | POA: Diagnosis not present

## 2013-03-10 DIAGNOSIS — R39198 Other difficulties with micturition: Secondary | ICD-10-CM | POA: Diagnosis not present

## 2013-03-10 DIAGNOSIS — N200 Calculus of kidney: Secondary | ICD-10-CM | POA: Diagnosis not present

## 2013-03-13 ENCOUNTER — Encounter: Payer: Self-pay | Admitting: Internal Medicine

## 2013-03-13 DIAGNOSIS — K921 Melena: Secondary | ICD-10-CM | POA: Diagnosis not present

## 2013-03-13 DIAGNOSIS — M899 Disorder of bone, unspecified: Secondary | ICD-10-CM | POA: Diagnosis not present

## 2013-03-13 DIAGNOSIS — J449 Chronic obstructive pulmonary disease, unspecified: Secondary | ICD-10-CM | POA: Diagnosis not present

## 2013-03-13 DIAGNOSIS — I679 Cerebrovascular disease, unspecified: Secondary | ICD-10-CM | POA: Diagnosis not present

## 2013-03-13 DIAGNOSIS — I259 Chronic ischemic heart disease, unspecified: Secondary | ICD-10-CM | POA: Diagnosis not present

## 2013-03-13 DIAGNOSIS — I509 Heart failure, unspecified: Secondary | ICD-10-CM | POA: Diagnosis not present

## 2013-03-13 DIAGNOSIS — C61 Malignant neoplasm of prostate: Secondary | ICD-10-CM | POA: Diagnosis not present

## 2013-03-13 DIAGNOSIS — M1990 Unspecified osteoarthritis, unspecified site: Secondary | ICD-10-CM | POA: Diagnosis not present

## 2013-03-15 DIAGNOSIS — M5126 Other intervertebral disc displacement, lumbar region: Secondary | ICD-10-CM | POA: Diagnosis not present

## 2013-03-15 DIAGNOSIS — M25559 Pain in unspecified hip: Secondary | ICD-10-CM | POA: Diagnosis not present

## 2013-03-15 DIAGNOSIS — IMO0002 Reserved for concepts with insufficient information to code with codable children: Secondary | ICD-10-CM | POA: Diagnosis not present

## 2013-03-15 DIAGNOSIS — M999 Biomechanical lesion, unspecified: Secondary | ICD-10-CM | POA: Diagnosis not present

## 2013-03-21 ENCOUNTER — Ambulatory Visit (INDEPENDENT_AMBULATORY_CARE_PROVIDER_SITE_OTHER): Payer: Medicare Other | Admitting: *Deleted

## 2013-03-21 DIAGNOSIS — I4891 Unspecified atrial fibrillation: Secondary | ICD-10-CM

## 2013-03-21 DIAGNOSIS — I6350 Cerebral infarction due to unspecified occlusion or stenosis of unspecified cerebral artery: Secondary | ICD-10-CM

## 2013-03-21 DIAGNOSIS — I238 Other current complications following acute myocardial infarction: Secondary | ICD-10-CM | POA: Diagnosis not present

## 2013-03-21 DIAGNOSIS — G459 Transient cerebral ischemic attack, unspecified: Secondary | ICD-10-CM | POA: Diagnosis not present

## 2013-03-31 ENCOUNTER — Other Ambulatory Visit: Payer: Self-pay | Admitting: Pharmacist

## 2013-03-31 MED ORDER — WARFARIN SODIUM 5 MG PO TABS: ORAL_TABLET | ORAL | Status: DC

## 2013-04-13 ENCOUNTER — Encounter: Payer: Self-pay | Admitting: Internal Medicine

## 2013-04-14 ENCOUNTER — Ambulatory Visit (INDEPENDENT_AMBULATORY_CARE_PROVIDER_SITE_OTHER): Payer: Medicare Other | Admitting: Internal Medicine

## 2013-04-14 ENCOUNTER — Encounter: Payer: Self-pay | Admitting: Internal Medicine

## 2013-04-14 ENCOUNTER — Encounter (HOSPITAL_COMMUNITY): Payer: Self-pay | Admitting: Pharmacy Technician

## 2013-04-14 VITALS — BP 108/58 | HR 76 | Ht 70.0 in | Wt 181.0 lb

## 2013-04-14 DIAGNOSIS — I238 Other current complications following acute myocardial infarction: Secondary | ICD-10-CM

## 2013-04-14 DIAGNOSIS — K625 Hemorrhage of anus and rectum: Secondary | ICD-10-CM

## 2013-04-14 DIAGNOSIS — Z7901 Long term (current) use of anticoagulants: Secondary | ICD-10-CM

## 2013-04-14 MED ORDER — MOVIPREP 100 G PO SOLR: ORAL | Status: DC

## 2013-04-14 NOTE — Patient Instructions (Signed)
You have been scheduled for a colonoscopy with propofol. Please follow written instructions given to you at your visit today.  Please pick up your prep kit at the pharmacy within the next 1-3 days. If you use inhalers (even only as needed), please bring them with you on the day of your procedure. Your physician has requested that you go to www.startemmi.com and enter the access code given to you at your visit today. This web site gives a general overview about your procedure. However, you should still follow specific instructions given to you by our office regarding your preparation for the procedure.  We have sent the following medications to your pharmacy for you to pick up at your convenience: Moviprep

## 2013-04-14 NOTE — Progress Notes (Signed)
Patient ID: Donald [name], male   DOB: 02/15/1927, 80 y.o.   MRN: [id number] HPI: Donald [name] is an 80 yo male with PMH of CAD status post CABG, ischemic cardiomyopathy, history of CVA, prostate cancer status post XRT, hyperlipidemia, nephrolithiasis and glaucoma who is seen in consultation at her request of Dr. Osborne Casco to evaluate rectal bleeding.  He is here alone today.  He reports 2-3 months of erratic painless rectal bleeding. He reports this is described as red blood both on the toilet tissue, on the stool, and occasionally dripping into the toilet or into his underwear. Bowel movements have been fairly regular for him which is usually once a day to once every other day. Occasionally he feels slightly constipated and requires straining to pass stool. He denies abdominal pain. No diarrhea. Good appetite. No weight loss. No nausea or vomiting. No melena. He does take warfarin.  He recalls a previous colonoscopy performed 9 or 10 years ago by Dr. Freddrick March, which was reportedly unremarkable.  He does have a sister with colorectal cancer at age 40  Past Medical History  Diagnosis Date  . Post-infarction apical thrombus   . CAD (coronary artery disease)   . Ischemic cardiomyopathy   . CVA (cerebral infarction)   . Hyperlipidemia   . Asthma     with worsening with beta blockade  . GI bleed     due to doll fully vessel which was clipped  . Glaucoma     excellent cataracts  . Nephrolithiasis     Past Surgical History  Procedure Laterality Date  . Coronary artery bypass graft  1998    LIMA to the LAD and saphenous vein graft tothe diagonal  . Cardiac stents  2003    Current Outpatient Prescriptions  Medication Sig Dispense Refill  . calcium-vitamin D 250-100 MG-UNIT per tablet Take 1 tablet by mouth 2 (two) times daily.      . fenofibrate micronized (LOFIBRA) 200 MG capsule Take 1 capsule (200 mg total) by mouth daily.  90 capsule  3  . Fluticasone Furoate-Vilanterol (BREO ELLIPTA)  100-25 MCG/INH AEPB Inhale 1 puff into the lungs daily.      Marland Kitchen latanoprost (XALATAN) 0.005 % ophthalmic solution Place 1 drop into the left eye daily.        . pravastatin (PRAVACHOL) 40 MG tablet Take 1 tablet (40 mg total) by mouth at bedtime.  90 tablet  4  . Tamsulosin HCl (FLOMAX) 0.4 MG CAPS Take 0.4 mg by mouth daily.        . Cholecalciferol (VITAMIN D) 2000 UNITS tablet Take 2,000 Units by mouth daily.      . Multiple Vitamin (MULTIVITAMIN WITH MINERALS) TABS tablet Take 1 tablet by mouth daily.      . ramipril (ALTACE) 2.5 MG capsule Take 2.5 mg by mouth every evening.      . warfarin (COUMADIN) 5 MG tablet Take 2.5-5 mg by mouth daily. Tuesday -Thursday-Saturday takes 1/2 tablet and takes 1 tablet the rest of the week       No current facility-administered medications for this visit.    Allergies  Allergen Reactions  . Morphine     Family History  Problem Relation Age of Onset  . Heart attack Father   . Heart disease Mother   . Stomach cancer Mother     History  Substance Use Topics  . Smoking status: Never Smoker   . Smokeless tobacco: Never Used  . Alcohol Use: No  ROS: As per history of present illness, otherwise negative  BP 108/58  Pulse 76  Ht 5' 10" (1.778 m)  Wt 181 lb (82.101 kg)  BMI 25.97 kg/m2 Constitutional: Well-developed and well-nourished. No distress. HEENT: Normocephalic and atraumatic. Oropharynx is clear and moist. No oropharyngeal exudate. Conjunctivae are normal.  No scleral icterus. Neck: Neck supple. Trachea midline. Cardiovascular: Normal rate, regular rhythm and intact distal pulses.  Pulmonary/chest: Effort normal and breath sounds normal. No wheezing, rales or rhonchi. Abdominal: Soft, nontender, nondistended. Bowel sounds active throughout.  Extremities: no clubbing, cyanosis, or edema Lymphadenopathy: No cervical adenopathy noted. Neurological: Alert and oriented to person place and time. Skin: Skin is warm and dry. No rashes  noted. Psychiatric: Normal mood and affect. Behavior is normal.  RELEVANT LABS AND IMAGING: Labs dated 03/13/2013  -  hemoglobin 12.9, hematocrit 37.8, platelet 288, ferritin 172.9, CMP within normal limits  CBC    Component Value Date/Time   WBC 4.7 11/30/2012 0842   RBC 4.28 11/30/2012 0842   HGB 13.5 11/30/2012 0842   HCT 40.4 11/30/2012 0842   PLT 247.0 11/30/2012 0842   MCV 94.4 11/30/2012 0842   MCHC 33.5 11/30/2012 0842   RDW 13.7 11/30/2012 0842   LYMPHSABS 1.0 11/30/2012 0842   MONOABS 0.3 11/30/2012 0842   EOSABS 0.2 11/30/2012 0842   BASOSABS 0.0 11/30/2012 0842    CMP     Component Value Date/Time   NA 137 11/30/2012 0842   K 4.0 11/30/2012 0842   CL 104 11/30/2012 0842   CO2 28 11/30/2012 0842   GLUCOSE 96 11/30/2012 0842   BUN 22 11/30/2012 0842   CREATININE 0.8 11/30/2012 0842   CALCIUM 9.2 11/30/2012 0842   PROT 6.2 11/30/2012 0842   ALBUMIN 3.7 11/30/2012 0842   AST 19 11/30/2012 0842   ALT 15 11/30/2012 0842   ALKPHOS 26* 11/30/2012 0842   BILITOT 0.4 11/30/2012 0842    ASSESSMENT/PLAN:  80 yo male with PMH of CAD status post CABG, ischemic cardiomyopathy, history of CVA, prostate cancer status post XRT, hyperlipidemia, nephrolithiasis and glaucoma who is seen in consultation at her request of Dr. [name] to evaluate rectal bleeding.  1.  Painless hematochezia -- we have discussed his painless rectal bleeding, and certainly this could be due to radiation proctitis given his history of prostate cancer with XRT. We also discussed the other possibilities in the differential diagnosis, including internal hemorrhoids, angiodysplastic lesions, polyps or even a tumor.  After discussion of possible workup, we have decided to pursue colonoscopy in the hospital setting where APC is available showed radiation proctitis be found.  We discussed the test including the risks and benefits and he is agreeable to proceed. His warfarin will need to be held and we will contact Dr. Crenshaw, for permission to hold the  warfarin at least 5 days leading up to colonoscopy. Will defer to Dr. Crenshaw on the need for Lovenox bridge.  Blood counts reviewed, last hemoglobin last month was normal which is reassuring.    2.  Hx of mural apical thrombus or warfarin -- defer to Dr. Crenshaw for decision on holding warfarin before colonoscopy and the need for Lovenox bridge    

## 2013-04-18 ENCOUNTER — Ambulatory Visit (INDEPENDENT_AMBULATORY_CARE_PROVIDER_SITE_OTHER): Payer: Medicare Other | Admitting: *Deleted

## 2013-04-18 DIAGNOSIS — I238 Other current complications following acute myocardial infarction: Secondary | ICD-10-CM

## 2013-04-18 DIAGNOSIS — I6350 Cerebral infarction due to unspecified occlusion or stenosis of unspecified cerebral artery: Secondary | ICD-10-CM

## 2013-04-18 DIAGNOSIS — I4891 Unspecified atrial fibrillation: Secondary | ICD-10-CM

## 2013-04-18 DIAGNOSIS — G459 Transient cerebral ischemic attack, unspecified: Secondary | ICD-10-CM

## 2013-04-18 LAB — POCT INR: INR: 2.2

## 2013-04-21 ENCOUNTER — Other Ambulatory Visit: Payer: Self-pay | Admitting: Gastroenterology

## 2013-04-24 ENCOUNTER — Telehealth: Payer: Self-pay | Admitting: Internal Medicine

## 2013-04-24 MED ORDER — MOVIPREP 100 G PO SOLR: ORAL | Status: DC

## 2013-04-24 NOTE — Telephone Encounter (Signed)
Sent colon prep to pts local pharmacy

## 2013-04-25 ENCOUNTER — Encounter (HOSPITAL_COMMUNITY): Payer: Self-pay | Admitting: *Deleted

## 2013-04-28 ENCOUNTER — Telehealth: Payer: Self-pay | Admitting: *Deleted

## 2013-04-28 MED ORDER — ENOXAPARIN SODIUM 80 MG/0.8ML ~~LOC~~ SOLN: 80.0000 mg | Freq: Two times a day (BID) | SUBCUTANEOUS | Status: DC

## 2013-04-28 NOTE — Telephone Encounter (Signed)
Patient is having colonoscopy on February 6 and wanted to know if he needs to bridge with lovenox from coumadin. He wanted me to let you know that his preference was not to do this but he is willing to do so if necessary. Please advise. Thanks, MI

## 2013-04-28 NOTE — Telephone Encounter (Signed)
Needs lovenox bridge. Donald [name]

## 2013-04-28 NOTE — Telephone Encounter (Signed)
Spoke with pt.  He has done Lovenox in the past and feels comfortable with instructions without coming to the clinic.    SCr- 0.8; Weight- 82kg  2/1- Last dose of Coumadin  2/2- No Coumadin or Lovenox  2/3- Lovenox 80mg  in AM and PM 2/4- Lovenox 80mg  in AM and PM 2/5- Lovenox 80mg  in AM only  2/6- Day of colonoscopy. If okay with Dr. Hilarie Fredrickson, restart Coumadin 1 1/2 tablets.  If they ask you to hold anticoagulation for a few days, please call the office for other instructions.  2/7- Lovenox 80mg  in AM and PM and Coumadin 1 1/2 tablets  2/8- Lovenox 80mg  in AM and PM and Coumadin 1 tablets  2/9- Lovenox 80mg  in AM and PM and Coumadin 1 tablet 2/10- Lovenox 80mg  in AM and PM and Coumadin 1/2 tablet 2/11- Recheck INR  Rx sent to [facility] on [address]

## 2013-04-28 NOTE — Telephone Encounter (Signed)
New problem   Pt returning a call. Please call pt

## 2013-04-30 DIAGNOSIS — D369 Benign neoplasm, unspecified site: Secondary | ICD-10-CM

## 2013-04-30 DIAGNOSIS — K627 Radiation proctitis: Secondary | ICD-10-CM

## 2013-04-30 HISTORY — DX: Radiation proctitis: K62.7

## 2013-04-30 HISTORY — DX: Benign neoplasm, unspecified site: D36.9

## 2013-05-02 ENCOUNTER — Telehealth: Payer: Self-pay | Admitting: Internal Medicine

## 2013-05-02 NOTE — Telephone Encounter (Signed)
Called pt told him I will leave him a free prep at our front desk for him to pick up. He said he will stop by to get it tomorrow.

## 2013-05-05 ENCOUNTER — Ambulatory Visit (HOSPITAL_COMMUNITY)
Admission: RE | Admit: 2013-05-05 | Discharge: 2013-05-05 | Disposition: A | Discharge status: Home / Self Care | Payer: Medicare Other | Source: Ambulatory Visit | Attending: Internal Medicine | Admitting: Internal Medicine

## 2013-05-05 ENCOUNTER — Ambulatory Visit (HOSPITAL_COMMUNITY): Admit: 2013-05-05 | Payer: Self-pay | Admitting: Internal Medicine

## 2013-05-05 ENCOUNTER — Encounter (HOSPITAL_COMMUNITY): Payer: Self-pay

## 2013-05-05 ENCOUNTER — Ambulatory Visit (HOSPITAL_COMMUNITY): Payer: Medicare Other | Admitting: Anesthesiology

## 2013-05-05 ENCOUNTER — Encounter (HOSPITAL_BASED_OUTPATIENT_CLINIC_OR_DEPARTMENT_OTHER): Payer: Self-pay | Admitting: *Deleted

## 2013-05-05 ENCOUNTER — Encounter (HOSPITAL_COMMUNITY): Payer: Medicare Other | Admitting: Anesthesiology

## 2013-05-05 ENCOUNTER — Encounter (HOSPITAL_COMMUNITY)
Admission: RE | Disposition: A | Discharge status: Home / Self Care | Payer: Self-pay | Source: Ambulatory Visit | Attending: Internal Medicine

## 2013-05-05 DIAGNOSIS — E785 Hyperlipidemia, unspecified: Secondary | ICD-10-CM | POA: Diagnosis not present

## 2013-05-05 DIAGNOSIS — K6289 Other specified diseases of anus and rectum: Secondary | ICD-10-CM | POA: Insufficient documentation

## 2013-05-05 DIAGNOSIS — K625 Hemorrhage of anus and rectum: Secondary | ICD-10-CM | POA: Diagnosis not present

## 2013-05-05 DIAGNOSIS — Z8673 Personal history of transient ischemic attack (TIA), and cerebral infarction without residual deficits: Secondary | ICD-10-CM | POA: Diagnosis not present

## 2013-05-05 DIAGNOSIS — K627 Radiation proctitis: Secondary | ICD-10-CM

## 2013-05-05 DIAGNOSIS — Y842 Radiological procedure and radiotherapy as the cause of abnormal reaction of the patient, or of later complication, without mention of misadventure at the time of the procedure: Secondary | ICD-10-CM | POA: Insufficient documentation

## 2013-05-05 DIAGNOSIS — Z951 Presence of aortocoronary bypass graft: Secondary | ICD-10-CM | POA: Insufficient documentation

## 2013-05-05 DIAGNOSIS — C61 Malignant neoplasm of prostate: Secondary | ICD-10-CM | POA: Insufficient documentation

## 2013-05-05 DIAGNOSIS — I2589 Other forms of chronic ischemic heart disease: Secondary | ICD-10-CM | POA: Diagnosis not present

## 2013-05-05 DIAGNOSIS — I2510 Atherosclerotic heart disease of native coronary artery without angina pectoris: Secondary | ICD-10-CM | POA: Diagnosis not present

## 2013-05-05 DIAGNOSIS — K635 Polyp of colon: Secondary | ICD-10-CM

## 2013-05-05 DIAGNOSIS — Z7901 Long term (current) use of anticoagulants: Secondary | ICD-10-CM | POA: Insufficient documentation

## 2013-05-05 DIAGNOSIS — K921 Melena: Secondary | ICD-10-CM

## 2013-05-05 DIAGNOSIS — K5730 Diverticulosis of large intestine without perforation or abscess without bleeding: Secondary | ICD-10-CM | POA: Insufficient documentation

## 2013-05-05 DIAGNOSIS — Z800 Family history of malignant neoplasm of digestive organs: Secondary | ICD-10-CM | POA: Insufficient documentation

## 2013-05-05 DIAGNOSIS — N200 Calculus of kidney: Secondary | ICD-10-CM | POA: Diagnosis not present

## 2013-05-05 DIAGNOSIS — D126 Benign neoplasm of colon, unspecified: Secondary | ICD-10-CM | POA: Diagnosis not present

## 2013-05-05 DIAGNOSIS — J45909 Unspecified asthma, uncomplicated: Secondary | ICD-10-CM | POA: Diagnosis not present

## 2013-05-05 DIAGNOSIS — H409 Unspecified glaucoma: Secondary | ICD-10-CM | POA: Insufficient documentation

## 2013-05-05 DIAGNOSIS — K5900 Constipation, unspecified: Secondary | ICD-10-CM | POA: Diagnosis not present

## 2013-05-05 DIAGNOSIS — Z79899 Other long term (current) drug therapy: Secondary | ICD-10-CM | POA: Diagnosis not present

## 2013-05-05 HISTORY — PX: COLONOSCOPY WITH PROPOFOL: SHX5780

## 2013-05-05 SURGERY — COLONOSCOPY WITH PROPOFOL
Anesthesia: Monitor Anesthesia Care

## 2013-05-05 SURGERY — COLONOSCOPY
Anesthesia: Moderate Sedation

## 2013-05-05 MED ORDER — PROPOFOL 10 MG/ML IV BOLUS
INTRAVENOUS | Status: AC
  Filled 2013-05-05: qty 20

## 2013-05-05 MED ORDER — PHENYLEPHRINE HCL 10 MG/ML IJ SOLN
INTRAMUSCULAR | Status: DC | PRN
  Administered 2013-05-05: 40 ug via INTRAVENOUS
  Administered 2013-05-05: 80 ug via INTRAVENOUS
  Administered 2013-05-05: 40 ug via INTRAVENOUS

## 2013-05-05 MED ORDER — KETAMINE HCL 10 MG/ML IJ SOLN
INTRAMUSCULAR | Status: AC
  Filled 2013-05-05: qty 1

## 2013-05-05 MED ORDER — PROPOFOL 10 MG/ML IV BOLUS
INTRAVENOUS | Status: DC | PRN
  Administered 2013-05-05: 50 mg via INTRAVENOUS

## 2013-05-05 MED ORDER — SODIUM CHLORIDE 0.9 % IV SOLN: INTRAVENOUS | Status: DC

## 2013-05-05 MED ORDER — PHENYLEPHRINE 40 MCG/ML (10ML) SYRINGE FOR IV PUSH (FOR BLOOD PRESSURE SUPPORT)
PREFILLED_SYRINGE | INTRAVENOUS | Status: AC
  Filled 2013-05-05: qty 10

## 2013-05-05 MED ORDER — LACTATED RINGERS IV SOLN
INTRAVENOUS | Status: DC
  Administered 2013-05-05: 08:00:00 via INTRAVENOUS

## 2013-05-05 MED ORDER — FENTANYL CITRATE 0.05 MG/ML IJ SOLN: 25.0000 ug | INTRAMUSCULAR | Status: DC | PRN

## 2013-05-05 MED ORDER — PROPOFOL INFUSION 10 MG/ML OPTIME
INTRAVENOUS | Status: DC | PRN
  Administered 2013-05-05: 200 ug/kg/min via INTRAVENOUS

## 2013-05-05 SURGICAL SUPPLY — 22 items

## 2013-05-05 NOTE — Discharge Instructions (Signed)
YOU HAD AN ENDOSCOPIC PROCEDURE TODAY: Refer to the procedure report that was given to you for any specific questions about what was found during the examination.  If the procedure report does not answer your questions, please call your gastroenterologist to clarify.  YOU SHOULD EXPECT: Some feelings of bloating in the abdomen. Passage of more gas than usual.  Walking can help get rid of the air that was put into your GI tract during the procedure and reduce the bloating. If you had a lower endoscopy (such as a colonoscopy or flexible sigmoidoscopy) you may notice spotting of blood in your stool or on the toilet paper.   DIET: Your first meal following the procedure should be a light meal and then it is ok to progress to your normal diet.  A half-sandwich or bowl of soup is an example of a good first meal.  Heavy or fried foods are harder to digest and may make you feel nasueas or bloated.  Drink plenty of fluids but you should avoid alcoholic beverages for 24 hours.  ACTIVITY: Your care partner should take you home directly after the procedure.  You should plan to take it easy, moving slowly for the rest of the day.  You can resume normal activity the day after the procedure however you should NOT DRIVE or use heavy machinery for 24 hours (because of the sedation medicines used during the test).    SYMPTOMS TO REPORT IMMEDIATELY  A gastroenterologist can be reached at any hour.  Please call your doctor's office for any of the following symptoms:   Following lower endoscopy (colonoscopy, flexible sigmoidoscopy)  Excessive amounts of blood in the stool  Significant tenderness, worsening of abdominal pains  Swelling of the abdomen that is new, acute  Fever of 100 or higher  Following upper endoscopy (EGD, EUS, ERCP)  Vomiting of blood or coffee ground material  New, significant abdominal pain  New, significant chest pain or pain under the shoulder blades  Painful or persistently difficult  swallowing  New shortness of breath  Black, tarry-looking stools  FOLLOW UP: If any biopsies were taken you will be contacted by phone or by letter within the next 1-3 weeks.  Call your gastroenterologist if you have not heard about the biopsies in 3 weeks.  Please also call your gastroenterologist's office with any specific questions about appointments or follow up tests. Colonoscopy A colonoscopy is an exam to look at the entire large intestine (colon). This exam can help find problems such as tumors, polyps, inflammation, and areas of bleeding. The exam takes about 1 hour.  LET [facility] CARE PROVIDER KNOW ABOUT:   Any allergies you have.  All medicines you are taking, including vitamins, herbs, eye drops, creams, and over-the-counter medicines.  Previous problems you or members of your family have had with the use of anesthetics.  Any blood disorders you have.  Previous surgeries you have had.  Medical conditions you have. RISKS AND COMPLICATIONS  Generally, this is a safe procedure. However, as with any procedure, complications can occur. Possible complications include:  Bleeding.  Tearing or rupture of the colon wall.  Reaction to medicines given during the exam.  Infection (rare). BEFORE THE PROCEDURE   Ask your health care provider about changing or stopping your regular medicines.  You may be prescribed an oral bowel prep. This involves drinking a large amount of medicated liquid, starting the day before your procedure. The liquid will cause you to have multiple loose stools until  your stool is almost clear or light green. This cleans out your colon in preparation for the procedure.  Do not eat or drink anything else once you have started the bowel prep, unless your health care provider tells you it is safe to do so.  Arrange for someone to drive you home after the procedure. PROCEDURE   You will be given medicine to help you relax (sedative).  You will lie on  your side with your knees bent.  A long, flexible tube with a light and camera on the end (colonoscope) will be inserted through the rectum and into the colon. The camera sends video back to a computer screen as it moves through the colon. The colonoscope also releases carbon dioxide gas to inflate the colon. This helps your health care provider see the area better.  During the exam, your health care provider may take a small tissue sample (biopsy) to be examined under a microscope if any abnormalities are found.  The exam is finished when the entire colon has been viewed. AFTER THE PROCEDURE   Do not drive for 24 hours after the exam.  You may have a small amount of blood in your stool.  You may pass moderate amounts of gas and have mild abdominal cramping or bloating. This is caused by the gas used to inflate your colon during the exam.  Ask when your test results will be ready and how you will get your results. Make sure you get your test results. Document Released: 03/13/2000 Document Revised: 01/04/2013 Document Reviewed: 11/21/2012 [facility] Patient Information 2014 [place].

## 2013-05-05 NOTE — Op Note (Signed)
[facility] [place], [address]   COLONOSCOPY PROCEDURE REPORT  PATIENT: Donald, [name]  MR#: [id number] BIRTHDATE: 06/07/1926 , 86  yrs. old GENDER: Male ENDOSCOPIST: Jerene Bears, MD REFERRED JI:RCVELFY [name], M.D. PROCEDURE DATE:  05/05/2013 PROCEDURE:   Colonoscopy with cold biopsy polypectomy and Colonoscopy with tissue ablation First Screening Colonoscopy - Avg.  risk and is 50 yrs.  old or older - No.  Prior Negative Screening - Now for repeat screening. N/A  History of Adenoma - Now for follow-up colonoscopy & has been > or = to 3 yrs.  N/A  Polyps Removed Today? Yes. ASA CLASS:   Class III INDICATIONS:Rectal Bleeding. MEDICATIONS: MAC sedation, administered by CRNA and See Anesthesia Report.  DESCRIPTION OF PROCEDURE:   After the risks benefits and alternatives of the procedure were thoroughly explained, informed consent was obtained.  A digital rectal exam revealed several skin tags.   The     endoscope was introduced through the anus and advanced to the cecum, which was identified by both the appendix and ileocecal valve. No adverse events experienced.   The quality of the prep was good, using MoviPrep  The instrument was then slowly withdrawn as the colon was fully examined.   COLON FINDINGS: Two sessile polyps ranging between 3-47mm in size were found in the transverse colon and sigmoid colon.  A polypectomy was performed with cold forceps.  The resection was complete and the polyp tissue was completely retrieved.   Mild diverticulosis was noted in the descending colon and sigmoid colon. A patch of radiation proctitis, involving 1/4 of the distal rectal wall was found and not actively bleeding.  APC ablation with ERBE Colon setting attempted performed with success. Care and attention was paid to ensure the lumen was intermittent suctioned for air removal.  No bleeding during or after treatment. Retroflexed views revealed 2 anal  skin tags.       The scope was withdrawn and the procedure completed.  COMPLICATIONS: There were no complications.    ENDOSCOPIC IMPRESSION: 1.   Two sessile polyps ranging between 3-58mm in size were found in the transverse colon and sigmoid colon; polypectomy was performed with cold forceps 2.   Mild diverticulosis was noted in the descending colon and sigmoid colon 3.   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  Await pathology results 2.  Resume Lovenox tomorrow morning and okay for warfarin dose tonight 3.  Office follow-up in about 1 month   eSigned:  Jerene Bears, MD 05/05/2013 9:57 AM   cc: The Patient and Domenick Gong, MD   PATIENT NAME:  Donald, [name] MR#: [id number]

## 2013-05-05 NOTE — Transfer of Care (Signed)
Immediate Anesthesia Transfer of Care Note  Patient: Donald [name]  Procedure(s) Performed: Procedure(s): COLONOSCOPY WITH PROPOFOL (N/A)  Patient Location: PACU, endo  Anesthesia Type:MAC  Level of Consciousness: Patient easily awoken, sedated, comfortable, cooperative, following commands, responds to stimulation.   Airway & Oxygen Therapy: Patient spontaneously breathing, ventilating well, oxygen via simple oxygen mask.  Post-op Assessment: Report given to PACU RN, vital signs reviewed and stable, moving all extremities.   Post vital signs: Reviewed and stable.  Complications: No apparent anesthesia complications

## 2013-05-05 NOTE — Preoperative (Signed)
Beta Blockers   Reason not to administer Beta Blockers:Not Applicable, not on home BB 

## 2013-05-05 NOTE — Interval H&P Note (Signed)
History and Physical Interval Note: No new complaints Here for colonoscopy The nature of the procedure, as well as the risks, benefits, and alternatives were carefully and thoroughly reviewed with the patient. Ample time for discussion and questions allowed. The patient understood, was satisfied, and agreed to proceed.     05/05/2013 8:52 AM  Harvel Ricks Deignan  has presented today for surgery, with the diagnosis of on blood thinner  The various methods of treatment have been discussed with the patient and family. After consideration of risks, benefits and other options for treatment, the patient has consented to  Procedure(s): COLONOSCOPY WITH PROPOFOL (N/A) as a surgical intervention .  The patient's history has been reviewed, patient examined, no change in status, stable for surgery.  I have reviewed the patient's chart and labs.  Questions were answered to the patient's satisfaction.     Noriel Guthrie M

## 2013-05-05 NOTE — Anesthesia Postprocedure Evaluation (Signed)
  Anesthesia Post-op Note  Patient: Donald [name]  Procedure(s) Performed: Procedure(s) (LRB): COLONOSCOPY WITH PROPOFOL (N/A)  Patient Location: PACU  Anesthesia Type: MAC  Level of Consciousness: awake and alert   Airway and Oxygen Therapy: Patient Spontanous Breathing  Post-op Pain: mild  Post-op Assessment: Post-op Vital signs reviewed, Patient's Cardiovascular Status Stable, Respiratory Function Stable, Patent Airway and No signs of Nausea or vomiting  Last Vitals:  Filed Vitals:   05/05/13 0953  BP: 87/55  Pulse:   Temp:   Resp: 18    Post-op Vital Signs: stable   Complications: No apparent anesthesia complications

## 2013-05-05 NOTE — H&P (View-Only) (Signed)
Patient ID: Donald [name], male   DOB: 02/15/1927, 80 y.o.   MRN: [id number] HPI: Donald [name] is an 80 yo male with PMH of CAD status post CABG, ischemic cardiomyopathy, history of CVA, prostate cancer status post XRT, hyperlipidemia, nephrolithiasis and glaucoma who is seen in consultation at her request of Donald [name] to evaluate rectal bleeding.  He is here alone today.  He reports 2-3 months of erratic painless rectal bleeding. He reports this is described as red blood both on the toilet tissue, on the stool, and occasionally dripping into the toilet or into his underwear. Bowel movements have been fairly regular for him which is usually once a day to once every other day. Occasionally he feels slightly constipated and requires straining to pass stool. He denies abdominal pain. No diarrhea. Good appetite. No weight loss. No nausea or vomiting. No melena. He does take warfarin.  He recalls a previous colonoscopy performed 9 or 10 years ago by Donald [name], which was reportedly unremarkable.  He does have a sister with colorectal cancer at age 40  Past Medical History  Diagnosis Date  . Post-infarction apical thrombus   . CAD (coronary artery disease)   . Ischemic cardiomyopathy   . CVA (cerebral infarction)   . Hyperlipidemia   . Asthma     with worsening with beta blockade  . GI bleed     due to doll fully vessel which was clipped  . Glaucoma     excellent cataracts  . Nephrolithiasis     Past Surgical History  Procedure Laterality Date  . Coronary artery bypass graft  1998    LIMA to the LAD and saphenous vein graft tothe diagonal  . Cardiac stents  2003    Current Outpatient Prescriptions  Medication Sig Dispense Refill  . calcium-vitamin D 250-100 MG-UNIT per tablet Take 1 tablet by mouth 2 (two) times daily.      . fenofibrate micronized (LOFIBRA) 200 MG capsule Take 1 capsule (200 mg total) by mouth daily.  90 capsule  3  . Fluticasone Furoate-Vilanterol (BREO ELLIPTA)  100-25 MCG/INH AEPB Inhale 1 puff into the lungs daily.      Marland Kitchen latanoprost (XALATAN) 0.005 % ophthalmic solution Place 1 drop into the left eye daily.        . pravastatin (PRAVACHOL) 40 MG tablet Take 1 tablet (40 mg total) by mouth at bedtime.  90 tablet  4  . Tamsulosin HCl (FLOMAX) 0.4 MG CAPS Take 0.4 mg by mouth daily.        . Cholecalciferol (VITAMIN D) 2000 UNITS tablet Take 2,000 Units by mouth daily.      . Multiple Vitamin (MULTIVITAMIN WITH MINERALS) TABS tablet Take 1 tablet by mouth daily.      . ramipril (ALTACE) 2.5 MG capsule Take 2.5 mg by mouth every evening.      . warfarin (COUMADIN) 5 MG tablet Take 2.5-5 mg by mouth daily. Tuesday -Thursday-Saturday takes 1/2 tablet and takes 1 tablet the rest of the week       No current facility-administered medications for this visit.    Allergies  Allergen Reactions  . Morphine     Family History  Problem Relation Age of Onset  . Heart attack Father   . Heart disease Mother   . Stomach cancer Mother     History  Substance Use Topics  . Smoking status: Never Smoker   . Smokeless tobacco: Never Used  . Alcohol Use: No  ROS: As per history of present illness, otherwise negative  BP 108/58  Pulse 76  Ht 5\' 10"  (1.778 m)  Wt 181 lb (82.101 kg)  BMI 25.97 kg/m2 Constitutional: Well-developed and well-nourished. No distress. HEENT: Normocephalic and atraumatic. Oropharynx is clear and moist. No oropharyngeal exudate. Conjunctivae are normal.  No scleral icterus. Neck: Neck supple. Trachea midline. Cardiovascular: Normal rate, regular rhythm and intact distal pulses.  Pulmonary/chest: Effort normal and breath sounds normal. No wheezing, rales or rhonchi. Abdominal: Soft, nontender, nondistended. Bowel sounds active throughout.  Extremities: no clubbing, cyanosis, or edema Lymphadenopathy: No cervical adenopathy noted. Neurological: Alert and oriented to person place and time. Skin: Skin is warm and dry. No rashes  noted. Psychiatric: Normal mood and affect. Behavior is normal.  RELEVANT LABS AND IMAGING: Labs dated 03/13/2013  -  hemoglobin 12.9, hematocrit 37.8, platelet 288, ferritin 172.9, CMP within normal limits  CBC    Component Value Date/Time   WBC 4.7 11/30/2012 0842   RBC 4.28 11/30/2012 0842   HGB 13.5 11/30/2012 0842   HCT 40.4 11/30/2012 0842   PLT 247.0 11/30/2012 0842   MCV 94.4 11/30/2012 0842   MCHC 33.5 11/30/2012 0842   RDW 13.7 11/30/2012 0842   LYMPHSABS 1.0 11/30/2012 0842   MONOABS 0.3 11/30/2012 0842   EOSABS 0.2 11/30/2012 0842   BASOSABS 0.0 11/30/2012 0842    CMP     Component Value Date/Time   NA 137 11/30/2012 0842   K 4.0 11/30/2012 0842   CL 104 11/30/2012 0842   CO2 28 11/30/2012 0842   GLUCOSE 96 11/30/2012 0842   BUN 22 11/30/2012 0842   CREATININE 0.8 11/30/2012 0842   CALCIUM 9.2 11/30/2012 0842   PROT 6.2 11/30/2012 0842   ALBUMIN 3.7 11/30/2012 0842   AST 19 11/30/2012 0842   ALT 15 11/30/2012 0842   ALKPHOS 26* 11/30/2012 0842   BILITOT 0.4 11/30/2012 0842    ASSESSMENT/PLAN:  80 yo male with PMH of CAD status post CABG, ischemic cardiomyopathy, history of CVA, prostate cancer status post XRT, hyperlipidemia, nephrolithiasis and glaucoma who is seen in consultation at her request of Donald [name] to evaluate rectal bleeding.  1.  Painless hematochezia -- we have discussed his painless rectal bleeding, and certainly this could be due to radiation proctitis given his history of prostate cancer with XRT. We also discussed the other possibilities in the differential diagnosis, including internal hemorrhoids, angiodysplastic lesions, polyps or even a tumor.  After discussion of possible workup, we have decided to pursue colonoscopy in the hospital setting where APC is available showed radiation proctitis be found.  We discussed the test including the risks and benefits and he is agreeable to proceed. His warfarin will need to be held and we will contact Donald [name], for permission to hold the  warfarin at least 5 days leading up to colonoscopy. Will defer to Donald [name] on the need for Lovenox bridge.  Blood counts reviewed, last hemoglobin last month was normal which is reassuring.    2.  Hx of mural apical thrombus or warfarin -- defer to Donald [name] for decision on holding warfarin before colonoscopy and the need for Lovenox bridge

## 2013-05-05 NOTE — Anesthesia Preprocedure Evaluation (Addendum)
Anesthesia Evaluation  Patient identified by MRN, date of birth, ID band Patient awake    Reviewed: Allergy & Precautions, H&P , NPO status , Patient's Chart, lab work & pertinent test results  Airway Mallampati: II TM Distance: >3 FB Neck ROM: full    Dental no notable dental hx. (+) Teeth Intact and Dental Advisory Given   Pulmonary neg pulmonary ROS, asthma ,  breath sounds clear to auscultation  Pulmonary exam normal       Cardiovascular + CAD, + Past MI, + Cardiac Stents and + CABG + dysrhythmias Atrial Fibrillation Rhythm:regular Rate:Normal  Ischemic cardiomyopathy   Neuro/Psych Glaucoma. CVA 2012 TIACVA, No Residual Symptoms negative psych ROS   GI/Hepatic negative GI ROS, Neg liver ROS,   Endo/Other  negative endocrine ROS  Renal/GU negative Renal ROS  negative genitourinary   Musculoskeletal   Abdominal   Peds  Hematology negative hematology ROS (+)   Anesthesia Other Findings   Reproductive/Obstetrics negative OB ROS                          Anesthesia Physical Anesthesia Plan  ASA: III  Anesthesia Plan: MAC   Post-op Pain Management:    Induction:   Airway Management Planned: Nasal Cannula  Additional Equipment:   Intra-op Plan:   Post-operative Plan:   Informed Consent: I have reviewed the patients History and Physical, chart, labs and discussed the procedure including the risks, benefits and alternatives for the proposed anesthesia with the patient or authorized representative who has indicated his/her understanding and acceptance.   Dental Advisory Given  Plan Discussed with: CRNA and Surgeon  Anesthesia Plan Comments:         Anesthesia Quick Evaluation

## 2013-05-08 ENCOUNTER — Encounter (HOSPITAL_COMMUNITY): Payer: Self-pay | Admitting: Internal Medicine

## 2013-05-10 ENCOUNTER — Ambulatory Visit (INDEPENDENT_AMBULATORY_CARE_PROVIDER_SITE_OTHER): Payer: Medicare Other | Admitting: *Deleted

## 2013-05-10 DIAGNOSIS — G459 Transient cerebral ischemic attack, unspecified: Secondary | ICD-10-CM | POA: Diagnosis not present

## 2013-05-10 DIAGNOSIS — I238 Other current complications following acute myocardial infarction: Secondary | ICD-10-CM | POA: Diagnosis not present

## 2013-05-10 DIAGNOSIS — Z5181 Encounter for therapeutic drug level monitoring: Secondary | ICD-10-CM

## 2013-05-10 DIAGNOSIS — I6350 Cerebral infarction due to unspecified occlusion or stenosis of unspecified cerebral artery: Secondary | ICD-10-CM

## 2013-05-10 DIAGNOSIS — I4891 Unspecified atrial fibrillation: Secondary | ICD-10-CM

## 2013-05-10 DIAGNOSIS — Z79899 Other long term (current) drug therapy: Secondary | ICD-10-CM | POA: Insufficient documentation

## 2013-05-10 LAB — POCT INR: INR: 2.2

## 2013-05-11 ENCOUNTER — Encounter: Payer: Self-pay | Admitting: Internal Medicine

## 2013-05-11 ENCOUNTER — Telehealth: Payer: Self-pay | Admitting: Internal Medicine

## 2013-05-11 MED ORDER — AMBULATORY NON FORMULARY MEDICATION: Status: DC

## 2013-05-11 NOTE — Telephone Encounter (Signed)
Patient had a colonoscopy on 05/05/13 for radiation proctitis.  He is having more bleeding now prior to the procedure.  Having to use a pad in his underwear.  Large half dollar size spot today.  He is back on coumadin and his INR was 2.2 yesterday. He states he held his morning dose of coumadin today.  Dr. Hilarie Fredrickson please review and advise

## 2013-05-11 NOTE — Telephone Encounter (Signed)
Left message for patient to call back  

## 2013-05-11 NOTE — Addendum Note (Signed)
Addended by: Marlon Pel on: 05/11/2013 03:17 PM   Modules accepted: Orders

## 2013-05-11 NOTE — Telephone Encounter (Signed)
Patient notified rx called in to [place] He is advised that he should not hold coumadin unless advised by the prescribing MD He will call next week Monday or Tuesday with an update, earlier if there is an increasing in bleeding

## 2013-05-11 NOTE — Telephone Encounter (Signed)
Likely related to healing in the distal rectum after APC laser ablation of radiation proctitis. Def. The warfarin is exacerbating the issue, but holding it need to be with warfarin clinic, prescribing MD, permission We can add sucralfate enemas 2 grams PR twice daily (2nd dose at bedtime) for 2 weeks.  Available at [place] Please ask that he keep [place] informed re: further bleeding and how much because we may need to follow CBC

## 2013-05-12 NOTE — Telephone Encounter (Signed)
Patient called back today to report no bleeding since lunch yesterday.  They just now have the enemas ready and they are over $100.00.  He wants to wait on picking them up unless he has further bleeding.  He is aware that if he has bleeding he will need to start on them.  He is agreeable to this.

## 2013-05-31 ENCOUNTER — Ambulatory Visit (INDEPENDENT_AMBULATORY_CARE_PROVIDER_SITE_OTHER): Payer: Medicare Other | Admitting: *Deleted

## 2013-05-31 DIAGNOSIS — I6350 Cerebral infarction due to unspecified occlusion or stenosis of unspecified cerebral artery: Secondary | ICD-10-CM | POA: Diagnosis not present

## 2013-05-31 DIAGNOSIS — I4891 Unspecified atrial fibrillation: Secondary | ICD-10-CM | POA: Diagnosis not present

## 2013-05-31 DIAGNOSIS — G459 Transient cerebral ischemic attack, unspecified: Secondary | ICD-10-CM | POA: Diagnosis not present

## 2013-05-31 DIAGNOSIS — I238 Other current complications following acute myocardial infarction: Secondary | ICD-10-CM | POA: Diagnosis not present

## 2013-05-31 DIAGNOSIS — Z5181 Encounter for therapeutic drug level monitoring: Secondary | ICD-10-CM

## 2013-05-31 LAB — POCT INR: INR: 1.9

## 2013-06-01 DIAGNOSIS — Z8546 Personal history of malignant neoplasm of prostate: Secondary | ICD-10-CM | POA: Diagnosis not present

## 2013-06-01 DIAGNOSIS — R39198 Other difficulties with micturition: Secondary | ICD-10-CM | POA: Diagnosis not present

## 2013-06-01 DIAGNOSIS — R351 Nocturia: Secondary | ICD-10-CM | POA: Diagnosis not present

## 2013-06-06 ENCOUNTER — Ambulatory Visit: Payer: Medicare Other | Admitting: Internal Medicine

## 2013-06-08 DIAGNOSIS — Z8546 Personal history of malignant neoplasm of prostate: Secondary | ICD-10-CM | POA: Diagnosis not present

## 2013-06-08 DIAGNOSIS — R3915 Urgency of urination: Secondary | ICD-10-CM | POA: Diagnosis not present

## 2013-06-08 DIAGNOSIS — R39198 Other difficulties with micturition: Secondary | ICD-10-CM | POA: Diagnosis not present

## 2013-06-14 ENCOUNTER — Ambulatory Visit (INDEPENDENT_AMBULATORY_CARE_PROVIDER_SITE_OTHER): Payer: Medicare Other | Admitting: Pharmacist

## 2013-06-14 DIAGNOSIS — I6350 Cerebral infarction due to unspecified occlusion or stenosis of unspecified cerebral artery: Secondary | ICD-10-CM | POA: Diagnosis not present

## 2013-06-14 DIAGNOSIS — G459 Transient cerebral ischemic attack, unspecified: Secondary | ICD-10-CM | POA: Diagnosis not present

## 2013-06-14 DIAGNOSIS — I4891 Unspecified atrial fibrillation: Secondary | ICD-10-CM | POA: Diagnosis not present

## 2013-06-14 DIAGNOSIS — I238 Other current complications following acute myocardial infarction: Secondary | ICD-10-CM | POA: Diagnosis not present

## 2013-06-14 DIAGNOSIS — Z5181 Encounter for therapeutic drug level monitoring: Secondary | ICD-10-CM

## 2013-06-14 LAB — POCT INR: INR: 2.1

## 2013-06-15 DIAGNOSIS — M25559 Pain in unspecified hip: Secondary | ICD-10-CM | POA: Diagnosis not present

## 2013-06-15 DIAGNOSIS — M5126 Other intervertebral disc displacement, lumbar region: Secondary | ICD-10-CM | POA: Diagnosis not present

## 2013-06-15 DIAGNOSIS — IMO0002 Reserved for concepts with insufficient information to code with codable children: Secondary | ICD-10-CM | POA: Diagnosis not present

## 2013-06-15 DIAGNOSIS — M999 Biomechanical lesion, unspecified: Secondary | ICD-10-CM | POA: Diagnosis not present

## 2013-06-16 ENCOUNTER — Encounter: Payer: Self-pay | Admitting: Internal Medicine

## 2013-06-19 ENCOUNTER — Ambulatory Visit (INDEPENDENT_AMBULATORY_CARE_PROVIDER_SITE_OTHER): Payer: Medicare Other | Admitting: Internal Medicine

## 2013-06-19 ENCOUNTER — Encounter: Payer: Self-pay | Admitting: Internal Medicine

## 2013-06-19 VITALS — BP 102/72 | HR 64 | Ht 68.0 in | Wt 183.0 lb

## 2013-06-19 DIAGNOSIS — K6289 Other specified diseases of anus and rectum: Secondary | ICD-10-CM | POA: Diagnosis not present

## 2013-06-19 DIAGNOSIS — I6350 Cerebral infarction due to unspecified occlusion or stenosis of unspecified cerebral artery: Secondary | ICD-10-CM | POA: Diagnosis not present

## 2013-06-19 DIAGNOSIS — K627 Radiation proctitis: Secondary | ICD-10-CM

## 2013-06-19 DIAGNOSIS — D126 Benign neoplasm of colon, unspecified: Secondary | ICD-10-CM | POA: Diagnosis not present

## 2013-06-19 NOTE — Progress Notes (Signed)
   Subjective:    Patient ID: Donald [name], male    DOB: 1926-07-02, 80 y.o.   MRN: [id number]  HPI Donald [name] is an 80 yo male with PMH of CAD status post CABG, ischemic cardiomyopathy, history of CVA, prostate cancer status post XRT, hyperlipidemia, nephrolithiasis, glaucoma, adenomatous colon polyps and radiation proctitis who is seen in followup. I initially saw him in January 2015 and he was having erratic painless rectal bleeding. He came for colonoscopy on 05/05/2013. This revealed 2 sessile polyps ranging in size from 3-5 mm which were removed as well as radiation proctitis involving 1/4 of the distal rectal wall. This was treated with APC ablation with success. He returns today for followup  After the procedure he felt well and had no pain. He did have one to 2 days of recurrent rectal bleeding several days after the procedure. He called the office and sucralfate enemas were recommended but before they could be filled the bleeding stopped. He did not use Carafate enemas. It has not recurred. He reports he has seen no rectal bleeding in a month. No abdominal pain. He is having normal formed bowel movements without diarrhea or constipation. No melena. Good appetite, no nausea or vomiting. No fevers or chills. He is happy with the results of APC ablation  Review of Systems As per history of present illness, otherwise negative  Current Medications, Allergies, Past Medical History, Past Surgical History, Family History and Social History were reviewed in [employer] record.     Objective:   Physical Exam BP 102/72  Pulse 64  Ht 5\' 8"  (1.727 m)  Wt 183 lb (83.008 kg)  BMI 27.83 kg/m2 Constitutional: Well-developed and well-nourished. No distress. Neurological: Alert and oriented to person place and time. Psychiatric: Normal mood and affect. Behavior is normal.     Assessment & Plan:  80 yo male with PMH of CAD status post CABG, ischemic cardiomyopathy, history  of CVA, prostate cancer status post XRT, hyperlipidemia, nephrolithiasis, glaucoma, adenomatous colon polyps and radiation proctitis who is seen in followup.  1.  Radiation proctitis status post APC ablation -- successful APC ablation of radiation proctitis with no recurrent rectal bleeding. He did have nearly 2 days of bleeding after the procedure which was likely due to treatment effect in the setting of chronic warfarin therapy. It sounds like he has had complete healing to this point. I would only repeat APC ablation in the rectum for recurrent symptoms, specifically rectal bleeding. He will continue to monitor for recurrent rectal bleeding and notify me should this return. His blood counts can be followed by his primary care doctor, Dr. Osborne [name].  He can return whenever needed  2.  Adenomatous colon polyp -- small polyp removed at colonoscopy in February 2015.  Based on age of 45 years, we will not schedule repeat surveillance colonoscopy at this time.

## 2013-06-19 NOTE — Patient Instructions (Signed)
Follow up as needed                                               We are excited to introduce MyChart, a new best-in-class service that provides you online access to important information in your electronic medical record. We want to make it easier for you to view your health information - all in one secure location - when and where you need it. We expect MyChart will enhance the quality of care and service we provide.  When you register for MyChart, you can:    View your test results.    Request appointments and receive appointment reminders via email.    Request medication renewals.    View your medical history, allergies, medications and immunizations.    Communicate with your physician's office through a password-protected site.    Conveniently print information such as your medication lists.  To find out if MyChart is right for you, please talk to a member of our clinical staff today. We will gladly answer your questions about this free health and wellness tool.  If you are age 78 or older and want a member of your family to have access to your record, you must provide written consent by completing a proxy form available at our office. Please speak to our clinical staff about guidelines regarding accounts for patients younger than age 18.  As you activate your MyChart account and need any technical assistance, please call the MyChart technical support line at [phone]) or email your question to [email or web address]. If you email your question(s), please include your name, a return phone number and the best time to reach you.  If you have non-urgent health-related questions, you can send a message to our office through MyChart at [email or web address]. If you have a medical emergency, call 911.  Thank you for using MyChart as your new health and wellness resource!   MyChart licensed from [employer],  1999-2010. Patents Pending.   

## 2013-07-11 ENCOUNTER — Ambulatory Visit (INDEPENDENT_AMBULATORY_CARE_PROVIDER_SITE_OTHER): Payer: Medicare Other | Admitting: *Deleted

## 2013-07-11 DIAGNOSIS — Z5181 Encounter for therapeutic drug level monitoring: Secondary | ICD-10-CM

## 2013-07-11 DIAGNOSIS — I238 Other current complications following acute myocardial infarction: Secondary | ICD-10-CM

## 2013-07-11 DIAGNOSIS — I4891 Unspecified atrial fibrillation: Secondary | ICD-10-CM

## 2013-07-11 DIAGNOSIS — G459 Transient cerebral ischemic attack, unspecified: Secondary | ICD-10-CM

## 2013-07-11 DIAGNOSIS — I6350 Cerebral infarction due to unspecified occlusion or stenosis of unspecified cerebral artery: Secondary | ICD-10-CM

## 2013-07-11 LAB — POCT INR: INR: 1.9

## 2013-07-21 DIAGNOSIS — H4011X0 Primary open-angle glaucoma, stage unspecified: Secondary | ICD-10-CM | POA: Diagnosis not present

## 2013-07-21 DIAGNOSIS — H4720 Unspecified optic atrophy: Secondary | ICD-10-CM | POA: Diagnosis not present

## 2013-07-21 DIAGNOSIS — H18519 Endothelial corneal dystrophy, unspecified eye: Secondary | ICD-10-CM | POA: Diagnosis not present

## 2013-07-21 DIAGNOSIS — H409 Unspecified glaucoma: Secondary | ICD-10-CM | POA: Diagnosis not present

## 2013-08-02 ENCOUNTER — Ambulatory Visit (INDEPENDENT_AMBULATORY_CARE_PROVIDER_SITE_OTHER): Payer: Medicare Other | Admitting: *Deleted

## 2013-08-02 DIAGNOSIS — I4891 Unspecified atrial fibrillation: Secondary | ICD-10-CM | POA: Diagnosis not present

## 2013-08-02 DIAGNOSIS — I6350 Cerebral infarction due to unspecified occlusion or stenosis of unspecified cerebral artery: Secondary | ICD-10-CM | POA: Diagnosis not present

## 2013-08-02 DIAGNOSIS — G459 Transient cerebral ischemic attack, unspecified: Secondary | ICD-10-CM

## 2013-08-02 DIAGNOSIS — I238 Other current complications following acute myocardial infarction: Secondary | ICD-10-CM | POA: Diagnosis not present

## 2013-08-02 DIAGNOSIS — Z5181 Encounter for therapeutic drug level monitoring: Secondary | ICD-10-CM

## 2013-08-02 LAB — POCT INR: INR: 1.9

## 2013-08-16 ENCOUNTER — Ambulatory Visit (INDEPENDENT_AMBULATORY_CARE_PROVIDER_SITE_OTHER): Payer: Medicare Other | Admitting: *Deleted

## 2013-08-16 DIAGNOSIS — I4891 Unspecified atrial fibrillation: Secondary | ICD-10-CM | POA: Diagnosis not present

## 2013-08-16 DIAGNOSIS — I6350 Cerebral infarction due to unspecified occlusion or stenosis of unspecified cerebral artery: Secondary | ICD-10-CM | POA: Diagnosis not present

## 2013-08-16 DIAGNOSIS — I238 Other current complications following acute myocardial infarction: Secondary | ICD-10-CM

## 2013-08-16 DIAGNOSIS — G459 Transient cerebral ischemic attack, unspecified: Secondary | ICD-10-CM | POA: Diagnosis not present

## 2013-08-16 DIAGNOSIS — Z5181 Encounter for therapeutic drug level monitoring: Secondary | ICD-10-CM

## 2013-08-16 LAB — POCT INR: INR: 2.9

## 2013-08-24 DIAGNOSIS — L821 Other seborrheic keratosis: Secondary | ICD-10-CM | POA: Diagnosis not present

## 2013-08-24 DIAGNOSIS — Z85828 Personal history of other malignant neoplasm of skin: Secondary | ICD-10-CM | POA: Diagnosis not present

## 2013-08-24 DIAGNOSIS — L570 Actinic keratosis: Secondary | ICD-10-CM | POA: Diagnosis not present

## 2013-09-04 ENCOUNTER — Ambulatory Visit (INDEPENDENT_AMBULATORY_CARE_PROVIDER_SITE_OTHER): Payer: Medicare Other | Admitting: Surgery

## 2013-09-04 DIAGNOSIS — G459 Transient cerebral ischemic attack, unspecified: Secondary | ICD-10-CM

## 2013-09-04 DIAGNOSIS — I4891 Unspecified atrial fibrillation: Secondary | ICD-10-CM | POA: Diagnosis not present

## 2013-09-04 DIAGNOSIS — I238 Other current complications following acute myocardial infarction: Secondary | ICD-10-CM

## 2013-09-04 DIAGNOSIS — I6350 Cerebral infarction due to unspecified occlusion or stenosis of unspecified cerebral artery: Secondary | ICD-10-CM

## 2013-09-04 DIAGNOSIS — Z5181 Encounter for therapeutic drug level monitoring: Secondary | ICD-10-CM

## 2013-09-04 LAB — POCT INR: INR: 2.7

## 2013-09-12 ENCOUNTER — Other Ambulatory Visit: Payer: Self-pay | Admitting: *Deleted

## 2013-09-12 DIAGNOSIS — I2510 Atherosclerotic heart disease of native coronary artery without angina pectoris: Secondary | ICD-10-CM | POA: Diagnosis not present

## 2013-09-12 DIAGNOSIS — Z0000 Encounter for general adult medical examination without abnormal findings: Secondary | ICD-10-CM | POA: Diagnosis not present

## 2013-09-12 DIAGNOSIS — M949 Disorder of cartilage, unspecified: Secondary | ICD-10-CM | POA: Diagnosis not present

## 2013-09-12 DIAGNOSIS — Z125 Encounter for screening for malignant neoplasm of prostate: Secondary | ICD-10-CM | POA: Diagnosis not present

## 2013-09-12 DIAGNOSIS — M899 Disorder of bone, unspecified: Secondary | ICD-10-CM | POA: Diagnosis not present

## 2013-09-12 DIAGNOSIS — R82998 Other abnormal findings in urine: Secondary | ICD-10-CM | POA: Diagnosis not present

## 2013-09-12 DIAGNOSIS — I509 Heart failure, unspecified: Secondary | ICD-10-CM | POA: Diagnosis not present

## 2013-09-12 MED ORDER — FENOFIBRATE MICRONIZED 200 MG PO CAPS: 200.0000 mg | ORAL_CAPSULE | Freq: Every day | ORAL | Status: DC

## 2013-09-26 DIAGNOSIS — R7309 Other abnormal glucose: Secondary | ICD-10-CM | POA: Diagnosis not present

## 2013-09-26 DIAGNOSIS — M949 Disorder of cartilage, unspecified: Secondary | ICD-10-CM | POA: Diagnosis not present

## 2013-09-26 DIAGNOSIS — I509 Heart failure, unspecified: Secondary | ICD-10-CM | POA: Diagnosis not present

## 2013-09-26 DIAGNOSIS — I951 Orthostatic hypotension: Secondary | ICD-10-CM | POA: Diagnosis not present

## 2013-09-26 DIAGNOSIS — H409 Unspecified glaucoma: Secondary | ICD-10-CM | POA: Diagnosis not present

## 2013-09-26 DIAGNOSIS — J449 Chronic obstructive pulmonary disease, unspecified: Secondary | ICD-10-CM | POA: Diagnosis not present

## 2013-09-26 DIAGNOSIS — Z0000 Encounter for general adult medical examination without abnormal findings: Secondary | ICD-10-CM | POA: Diagnosis not present

## 2013-09-26 DIAGNOSIS — Z1331 Encounter for screening for depression: Secondary | ICD-10-CM | POA: Diagnosis not present

## 2013-09-26 DIAGNOSIS — M1990 Unspecified osteoarthritis, unspecified site: Secondary | ICD-10-CM | POA: Diagnosis not present

## 2013-09-26 DIAGNOSIS — M899 Disorder of bone, unspecified: Secondary | ICD-10-CM | POA: Diagnosis not present

## 2013-10-02 ENCOUNTER — Ambulatory Visit (INDEPENDENT_AMBULATORY_CARE_PROVIDER_SITE_OTHER): Payer: Medicare Other

## 2013-10-02 DIAGNOSIS — G459 Transient cerebral ischemic attack, unspecified: Secondary | ICD-10-CM

## 2013-10-02 DIAGNOSIS — I238 Other current complications following acute myocardial infarction: Secondary | ICD-10-CM | POA: Diagnosis not present

## 2013-10-02 DIAGNOSIS — I4891 Unspecified atrial fibrillation: Secondary | ICD-10-CM | POA: Diagnosis not present

## 2013-10-02 DIAGNOSIS — Z5181 Encounter for therapeutic drug level monitoring: Secondary | ICD-10-CM

## 2013-10-02 DIAGNOSIS — I6350 Cerebral infarction due to unspecified occlusion or stenosis of unspecified cerebral artery: Secondary | ICD-10-CM | POA: Diagnosis not present

## 2013-10-02 LAB — POCT INR: INR: 2.1

## 2013-10-11 DIAGNOSIS — Z23 Encounter for immunization: Secondary | ICD-10-CM | POA: Diagnosis not present

## 2013-10-24 ENCOUNTER — Encounter: Payer: Self-pay | Admitting: *Deleted

## 2013-10-24 ENCOUNTER — Ambulatory Visit (INDEPENDENT_AMBULATORY_CARE_PROVIDER_SITE_OTHER): Payer: Medicare Other | Admitting: Cardiology

## 2013-10-24 ENCOUNTER — Encounter: Payer: Self-pay | Admitting: Cardiology

## 2013-10-24 VITALS — BP 116/72 | HR 67 | Ht 69.0 in | Wt 183.0 lb

## 2013-10-24 DIAGNOSIS — I4891 Unspecified atrial fibrillation: Secondary | ICD-10-CM

## 2013-10-24 DIAGNOSIS — I2510 Atherosclerotic heart disease of native coronary artery without angina pectoris: Secondary | ICD-10-CM

## 2013-10-24 DIAGNOSIS — E785 Hyperlipidemia, unspecified: Secondary | ICD-10-CM

## 2013-10-24 DIAGNOSIS — I428 Other cardiomyopathies: Secondary | ICD-10-CM | POA: Diagnosis not present

## 2013-10-24 MED ORDER — WARFARIN SODIUM 5 MG PO TABS: 2.5000 mg | ORAL_TABLET | Freq: Every day | ORAL | Status: DC

## 2013-10-24 NOTE — Progress Notes (Signed)
HPI: Pleasant male for fu of CAD. He is status post coronary bypassing graft in 1990; also with h/o ischemic cardiomyopathy, prior stroke and prior mural apical thrombus on chronic coumadin Rx. Prior abdominal ultrasound in May 2006 showed no aneurysm. Carotid Dopplers in November of 2005 showed 0-39% bilaterally. MRA in October of 2007 showed no obstructive disease. Echo in 10/07: EF 40-45%, mid to dist septal AK, periapical AK, mild MR, mild to mod TR, mild RAE. LHC 09/24/11: LM 30, oLAD 100%, dLAD fills via RV branch off RCA, IM diffuse 95% subtotal disease, mCFX 40%, OM1 normal, RCA normal with large RV branch supplying dLAD, PLA possibly occluded and most dRCA appears to fill from LIMA collats; LIMA patent but does not supply dLAD (connects via small vessel to ? DRCA/PLA, S-IM occluded). Circulation felt to be stable. Continue med Rx recommended. Patient last seen 7/13. Since then, The patient has not had chest pain. He has noticed increased dyspnea on exertion but no orthopnea, PND or pedal edema. No syncope.   Current Outpatient Prescriptions  Medication Sig Dispense Refill  . calcium-vitamin D 250-100 MG-UNIT per tablet Take 1 tablet by mouth 2 (two) times daily.      . Cholecalciferol (VITAMIN D) 2000 UNITS tablet Take 2,000 Units by mouth daily.      . fenofibrate micronized (LOFIBRA) 200 MG capsule Take 1 capsule (200 mg total) by mouth daily.  90 capsule  0  . Fluticasone Furoate-Vilanterol (BREO ELLIPTA) 100-25 MCG/INH AEPB Inhale into the lungs every morning.      . latanoprost (XALATAN) 0.005 % ophthalmic solution Place 1 drop into the left eye at bedtime as needed and may repeat dose one time if needed.       . Multiple Vitamin (MULTIVITAMIN WITH MINERALS) TABS tablet Take 1 tablet by mouth daily.      . pravastatin (PRAVACHOL) 40 MG tablet Take 1 tablet (40 mg total) by mouth at bedtime.  90 tablet  4  . ramipril (ALTACE) 2.5 MG capsule Take 2.5 mg by mouth every evening.        . Tamsulosin HCl (FLOMAX) 0.4 MG CAPS Take 0.4 mg by mouth every evening.       . warfarin (COUMADIN) 5 MG tablet Take 2.5-5 mg by mouth daily. Tuesday -Thursday-Saturday takes 1/2 tablet and takes 1 tablet the rest of the week       No current facility-administered medications for this visit.     Past Medical History  Diagnosis Date  . Post-infarction apical thrombus   . CAD (coronary artery disease)   . Ischemic cardiomyopathy   . CVA (cerebral infarction) 2012    tia, mild no residual defecits  . Asthma     with worsening with beta blockade  . GI bleed 8 yrs ago    due to doll fully vessel which was clipped  . Glaucoma     excellent cataracts  . Nephrolithiasis   . Prostate cancer 2012  . Tubular adenoma 04/2013    Dr. Hilarie Fredrickson  . Radiation proctitis Feb 2015    treated with APC    Past Surgical History  Procedure Laterality Date  . Cardiac stents  2003  . Coronary artery bypass graft  1998    LIMA to the LAD and saphenous vein graft tothe diagonal  . History of radiation treatment  2012    x 40 treatments done  . Colonoscopy with propofol N/A 05/05/2013    Procedure: COLONOSCOPY WITH PROPOFOL;  Surgeon: Jerene Bears, MD;  Location: [facility];  Service: Gastroenterology;  Laterality: N/A;    History   Social History  . Marital Status: Married    Spouse Name: N/A    Number of Children: 3  . Years of Education: N/A   Occupational History  . Retired Conservation officer, [employer]    Social History Main Topics  . Smoking status: Never Smoker   . Smokeless tobacco: Never Used  . Alcohol Use: Yes     Comment: occasional beer or wine  . Drug Use: No  . Sexual Activity: Not on file   Other Topics Concern  . Not on file   Social History Narrative  . No narrative on file    ROS: no fevers or chills, productive cough, hemoptysis, dysphasia, odynophagia, melena, hematochezia, dysuria, hematuria, rash, seizure activity, orthopnea, PND, pedal edema, claudication. Remaining  systems are negative.  Physical Exam: Well-developed well-nourished in no acute distress.  Skin is warm and dry.  HEENT is normal.  Neck is supple.  Chest is clear to auscultation with normal expansion.  Cardiovascular exam is regular rate and rhythm.  Abdominal exam nontender or distended. No masses palpated. Extremities show no edema. neuro grossly intact  ECG Sinus rhythm at a rate of 67. Right bundle branch block

## 2013-10-24 NOTE — Assessment & Plan Note (Signed)
Continue statin. 

## 2013-10-24 NOTE — Patient Instructions (Signed)
Your physician recommends that you schedule a follow-up appointment in: 3 MONTHS WITH DR CRENSHAW  Your physician has requested that you have an echocardiogram. Echocardiography is a painless test that uses sound waves to create images of your heart. It provides your doctor with information about the size and shape of your heart and how well your heart's chambers and valves are working. This procedure takes approximately one hour. There are no restrictions for this procedure.    

## 2013-10-24 NOTE — Assessment & Plan Note (Signed)
Continue statin. Not on aspirin given need for Coumadin. 

## 2013-10-24 NOTE — Assessment & Plan Note (Addendum)
Continue low-dose ACE inhibitor. He has been intolerant to beta blockade in the past because of worsening asthma. Note he is having some increased dyspnea which he attributes to deconditioning. Check echocardiogram for LV function. If his symptoms do not improve he would be agreeable to a nuclear study to exclude anginal equivalent.

## 2013-10-24 NOTE — Assessment & Plan Note (Signed)
Given history of mural thrombus at the apex and TIA continue Coumadin.

## 2013-11-13 ENCOUNTER — Ambulatory Visit (INDEPENDENT_AMBULATORY_CARE_PROVIDER_SITE_OTHER): Payer: Medicare Other | Admitting: Pharmacist Clinician (PhC)/ Clinical Pharmacy Specialist

## 2013-11-13 ENCOUNTER — Ambulatory Visit (HOSPITAL_COMMUNITY)
Admission: RE | Admit: 2013-11-13 | Discharge: 2013-11-13 | Disposition: A | Discharge status: Home / Self Care | Payer: Medicare Other | Source: Ambulatory Visit | Attending: Cardiology | Admitting: Cardiology

## 2013-11-13 DIAGNOSIS — I238 Other current complications following acute myocardial infarction: Secondary | ICD-10-CM

## 2013-11-13 DIAGNOSIS — I4891 Unspecified atrial fibrillation: Secondary | ICD-10-CM | POA: Diagnosis not present

## 2013-11-13 DIAGNOSIS — Z5181 Encounter for therapeutic drug level monitoring: Secondary | ICD-10-CM | POA: Diagnosis not present

## 2013-11-13 DIAGNOSIS — I369 Nonrheumatic tricuspid valve disorder, unspecified: Secondary | ICD-10-CM

## 2013-11-13 DIAGNOSIS — G459 Transient cerebral ischemic attack, unspecified: Secondary | ICD-10-CM | POA: Diagnosis not present

## 2013-11-13 DIAGNOSIS — I2510 Atherosclerotic heart disease of native coronary artery without angina pectoris: Secondary | ICD-10-CM

## 2013-11-13 DIAGNOSIS — I6350 Cerebral infarction due to unspecified occlusion or stenosis of unspecified cerebral artery: Secondary | ICD-10-CM

## 2013-11-13 DIAGNOSIS — I2789 Other specified pulmonary heart diseases: Secondary | ICD-10-CM | POA: Insufficient documentation

## 2013-11-13 LAB — POCT INR: INR: 2.4

## 2013-11-13 NOTE — Progress Notes (Addendum)
2D Echocardiogram Complete.  11/13/2013   Merdith Boyd, RDCS   Definity Contrast was administered which identified a small mural chronic thrombus at the apex of the left ventricle.

## 2013-11-14 ENCOUNTER — Other Ambulatory Visit: Payer: Self-pay | Admitting: *Deleted

## 2013-11-14 MED ORDER — PERFLUTREN LIPID MICROSPHERE
1.0000 mL | INTRAVENOUS | Status: AC | PRN
  Administered 2013-11-13: 3 mL via INTRAVENOUS

## 2013-11-14 MED ORDER — PERFLUTREN LIPID MICROSPHERE: 1.0000 mL | INTRAVENOUS | Status: AC | PRN

## 2013-12-01 ENCOUNTER — Encounter: Payer: Self-pay | Admitting: *Deleted

## 2013-12-06 ENCOUNTER — Other Ambulatory Visit: Payer: Self-pay | Admitting: *Deleted

## 2013-12-06 MED ORDER — FENOFIBRATE MICRONIZED 200 MG PO CAPS: 200.0000 mg | ORAL_CAPSULE | Freq: Every day | ORAL | Status: DC

## 2013-12-07 DIAGNOSIS — Z8546 Personal history of malignant neoplasm of prostate: Secondary | ICD-10-CM | POA: Diagnosis not present

## 2013-12-07 DIAGNOSIS — R3915 Urgency of urination: Secondary | ICD-10-CM | POA: Diagnosis not present

## 2013-12-15 DIAGNOSIS — N320 Bladder-neck obstruction: Secondary | ICD-10-CM | POA: Diagnosis not present

## 2013-12-15 DIAGNOSIS — Z8546 Personal history of malignant neoplasm of prostate: Secondary | ICD-10-CM | POA: Diagnosis not present

## 2013-12-22 DIAGNOSIS — H409 Unspecified glaucoma: Secondary | ICD-10-CM | POA: Diagnosis not present

## 2013-12-22 DIAGNOSIS — Z961 Presence of intraocular lens: Secondary | ICD-10-CM | POA: Diagnosis not present

## 2013-12-22 DIAGNOSIS — H4720 Unspecified optic atrophy: Secondary | ICD-10-CM | POA: Diagnosis not present

## 2013-12-22 DIAGNOSIS — H4011X0 Primary open-angle glaucoma, stage unspecified: Secondary | ICD-10-CM | POA: Diagnosis not present

## 2013-12-25 ENCOUNTER — Ambulatory Visit (INDEPENDENT_AMBULATORY_CARE_PROVIDER_SITE_OTHER): Payer: Medicare Other | Admitting: Pharmacist Clinician (PhC)/ Clinical Pharmacy Specialist

## 2013-12-25 DIAGNOSIS — I6350 Cerebral infarction due to unspecified occlusion or stenosis of unspecified cerebral artery: Secondary | ICD-10-CM

## 2013-12-25 DIAGNOSIS — I4891 Unspecified atrial fibrillation: Secondary | ICD-10-CM

## 2013-12-25 DIAGNOSIS — I238 Other current complications following acute myocardial infarction: Secondary | ICD-10-CM

## 2013-12-25 DIAGNOSIS — G459 Transient cerebral ischemic attack, unspecified: Secondary | ICD-10-CM | POA: Diagnosis not present

## 2013-12-25 DIAGNOSIS — Z5181 Encounter for therapeutic drug level monitoring: Secondary | ICD-10-CM

## 2013-12-25 LAB — POCT INR: INR: 2.3

## 2014-01-16 ENCOUNTER — Other Ambulatory Visit: Payer: Self-pay

## 2014-01-16 MED ORDER — PRAVASTATIN SODIUM 40 MG PO TABS: 40.0000 mg | ORAL_TABLET | Freq: Every day | ORAL | Status: DC

## 2014-01-24 ENCOUNTER — Encounter: Payer: Self-pay | Admitting: Cardiology

## 2014-01-24 ENCOUNTER — Ambulatory Visit (INDEPENDENT_AMBULATORY_CARE_PROVIDER_SITE_OTHER): Payer: Medicare Other | Admitting: Pharmacist Clinician (PhC)/ Clinical Pharmacy Specialist

## 2014-01-24 ENCOUNTER — Ambulatory Visit (INDEPENDENT_AMBULATORY_CARE_PROVIDER_SITE_OTHER): Payer: Medicare Other | Admitting: Cardiology

## 2014-01-24 VITALS — BP 124/70 | HR 60 | Ht 69.0 in | Wt 186.6 lb

## 2014-01-24 DIAGNOSIS — I2510 Atherosclerotic heart disease of native coronary artery without angina pectoris: Secondary | ICD-10-CM | POA: Diagnosis not present

## 2014-01-24 DIAGNOSIS — E7800 Pure hypercholesterolemia, unspecified: Secondary | ICD-10-CM

## 2014-01-24 DIAGNOSIS — I639 Cerebral infarction, unspecified: Secondary | ICD-10-CM | POA: Diagnosis not present

## 2014-01-24 DIAGNOSIS — I4891 Unspecified atrial fibrillation: Secondary | ICD-10-CM

## 2014-01-24 DIAGNOSIS — I480 Paroxysmal atrial fibrillation: Secondary | ICD-10-CM

## 2014-01-24 DIAGNOSIS — Z5181 Encounter for therapeutic drug level monitoring: Secondary | ICD-10-CM

## 2014-01-24 DIAGNOSIS — I6350 Cerebral infarction due to unspecified occlusion or stenosis of unspecified cerebral artery: Secondary | ICD-10-CM

## 2014-01-24 DIAGNOSIS — G459 Transient cerebral ischemic attack, unspecified: Secondary | ICD-10-CM | POA: Diagnosis not present

## 2014-01-24 LAB — POCT INR: INR: 2.9

## 2014-01-24 NOTE — Progress Notes (Signed)
HPI: FU CAD. He is status post coronary bypassing graft in 1990; also with h/o ischemic cardiomyopathy, prior stroke and prior mural apical thrombus on chronic coumadin Rx. Prior abdominal ultrasound in May 2006 showed no aneurysm. Carotid Dopplers in November of 2005 showed 0-39% bilaterally. MRA in October of 2007 showed no obstructive disease. LHC 09/24/11: LM 30, oLAD 100%, dLAD fills via RV branch off RCA, IM diffuse 95% subtotal disease, mCFX 40%, OM1 normal, RCA normal with large RV branch supplying dLAD, PLA possibly occluded and most dRCA appears to fill from LIMA collats; LIMA patent but does not supply dLAD (connects via small vessel to ? DRCA/PLA, S-IM occluded). Circulation felt to be stable. Continue med Rx recommended. Echocardiogram repeated August 2015. There was a small layered apical thrombus. Ejection fraction 50-55%. Grade 1 diastolic dysfunction. Trace aortic insufficiency and mitral regurgitation. Mild left atrial enlargement. Moderately elevated pulmonary pressures. Since last seen, the patient has dyspnea with more extreme activities but not with routine activities. It is relieved with rest. It is not associated with chest pain. There is no orthopnea, PND or pedal edema. There is no syncope or palpitations. There is no exertional chest pain.    Current Outpatient Prescriptions  Medication Sig Dispense Refill  . calcium-vitamin D 250-100 MG-UNIT per tablet Take 1 tablet by mouth 2 (two) times daily.      . Cholecalciferol (VITAMIN D) 2000 UNITS tablet Take 2,000 Units by mouth daily.      . fenofibrate micronized (LOFIBRA) 200 MG capsule Take 1 capsule (200 mg total) by mouth daily.  90 capsule  0  . Fluticasone Furoate-Vilanterol (BREO ELLIPTA) 100-25 MCG/INH AEPB Inhale into the lungs every morning.      . latanoprost (XALATAN) 0.005 % ophthalmic solution Place 1 drop into the left eye at bedtime as needed and may repeat dose one time if needed.       . Multiple Vitamin  (MULTIVITAMIN WITH MINERALS) TABS tablet Take 1 tablet by mouth daily.      . pravastatin (PRAVACHOL) 40 MG tablet Take 1 tablet (40 mg total) by mouth at bedtime.  90 tablet  4  . ramipril (ALTACE) 2.5 MG capsule Take 2.5 mg by mouth every evening.      . Tamsulosin HCl (FLOMAX) 0.4 MG CAPS Take 0.4 mg by mouth every evening.       . warfarin (COUMADIN) 5 MG tablet Take 5 mg by mouth daily. As directed per coumadin clinic       No current facility-administered medications for this visit.     Past Medical History  Diagnosis Date  . Post-infarction apical thrombus   . CAD (coronary artery disease)   . Ischemic cardiomyopathy   . CVA (cerebral infarction) 2012    tia, mild no residual defecits  . Asthma     with worsening with beta blockade  . GI bleed 8 yrs ago    due to doll fully vessel which was clipped  . Glaucoma     excellent cataracts  . Nephrolithiasis   . Prostate cancer 2012  . Tubular adenoma 04/2013    Dr. Hilarie Fredrickson  . Radiation proctitis Feb 2015    treated with APC    Past Surgical History  Procedure Laterality Date  . Cardiac stents  2003  . Coronary artery bypass graft  1998    LIMA to the LAD and saphenous vein graft tothe diagonal  . History of radiation treatment  2012  x 40 treatments done  . Colonoscopy with propofol N/A 05/05/2013    Procedure: COLONOSCOPY WITH PROPOFOL;  Surgeon: Jerene Bears, MD;  Location: [facility];  Service: Gastroenterology;  Laterality: N/A;    History   Social History  . Marital Status: Married    Spouse Name: N/A    Number of Children: 3  . Years of Education: N/A   Occupational History  . Retired Conservation officer, [employer]    Social History Main Topics  . Smoking status: Never Smoker   . Smokeless tobacco: Never Used  . Alcohol Use: Yes     Comment: occasional beer or wine  . Drug Use: No  . Sexual Activity: Not on file   Other Topics Concern  . Not on file   Social History Narrative  . No narrative on file     ROS: no fevers or chills, productive cough, hemoptysis, dysphasia, odynophagia, melena, hematochezia, dysuria, hematuria, rash, seizure activity, orthopnea, PND, pedal edema, claudication. Remaining systems are negative.  Physical Exam: Well-developed well-nourished in no acute distress.  Skin is warm and dry.  HEENT is normal.  Neck is supple.  Chest is clear to auscultation with normal expansion.  Cardiovascular exam is regular rate and rhythm.  Abdominal exam nontender or distended. No masses palpated. Extremities show no edema. neuro grossly intact  ECG October 24 2013-sinus rhythm with right bundle branch block

## 2014-01-24 NOTE — Assessment & Plan Note (Signed)
Continue statin. 

## 2014-01-24 NOTE — Assessment & Plan Note (Signed)
Continue low-dose ACE inhibitor. He has been intolerant to beta blockade in the past because of worsening asthma.

## 2014-01-24 NOTE — Patient Instructions (Signed)
Your physician wants you to follow-up in: ONE YEAR WITH DR CRENSHAW You will receive a reminder letter in the mail two months in advance. If you don't receive a letter, please call our office to schedule the follow-up appointment.  

## 2014-01-24 NOTE — Assessment & Plan Note (Signed)
Given history of TIA and continued evidence of thrombus on echocardiogram will continue Coumadin long-term.

## 2014-01-24 NOTE — Assessment & Plan Note (Signed)
Continue statin. Not on aspirin given need for Coumadin. 

## 2014-02-15 DIAGNOSIS — L570 Actinic keratosis: Secondary | ICD-10-CM | POA: Diagnosis not present

## 2014-03-02 DIAGNOSIS — M859 Disorder of bone density and structure, unspecified: Secondary | ICD-10-CM | POA: Diagnosis not present

## 2014-03-02 DIAGNOSIS — M1990 Unspecified osteoarthritis, unspecified site: Secondary | ICD-10-CM | POA: Diagnosis not present

## 2014-03-02 DIAGNOSIS — J449 Chronic obstructive pulmonary disease, unspecified: Secondary | ICD-10-CM | POA: Diagnosis not present

## 2014-03-02 DIAGNOSIS — H409 Unspecified glaucoma: Secondary | ICD-10-CM | POA: Diagnosis not present

## 2014-03-02 DIAGNOSIS — I509 Heart failure, unspecified: Secondary | ICD-10-CM | POA: Diagnosis not present

## 2014-03-02 DIAGNOSIS — Z9861 Coronary angioplasty status: Secondary | ICD-10-CM | POA: Diagnosis not present

## 2014-03-02 DIAGNOSIS — C61 Malignant neoplasm of prostate: Secondary | ICD-10-CM | POA: Diagnosis not present

## 2014-03-02 DIAGNOSIS — I679 Cerebrovascular disease, unspecified: Secondary | ICD-10-CM | POA: Diagnosis not present

## 2014-03-03 ENCOUNTER — Other Ambulatory Visit: Payer: Self-pay | Admitting: Cardiovascular Disease

## 2014-03-03 ENCOUNTER — Other Ambulatory Visit: Payer: Self-pay | Admitting: Cardiology

## 2014-03-03 NOTE — Telephone Encounter (Signed)
Rx was sent to pharmacy electronically. 

## 2014-03-07 ENCOUNTER — Ambulatory Visit (INDEPENDENT_AMBULATORY_CARE_PROVIDER_SITE_OTHER): Payer: Medicare Other | Admitting: Pharmacist Clinician (PhC)/ Clinical Pharmacy Specialist

## 2014-03-07 DIAGNOSIS — M25551 Pain in right hip: Secondary | ICD-10-CM | POA: Diagnosis not present

## 2014-03-07 DIAGNOSIS — M5417 Radiculopathy, lumbosacral region: Secondary | ICD-10-CM | POA: Diagnosis not present

## 2014-03-07 DIAGNOSIS — I639 Cerebral infarction, unspecified: Secondary | ICD-10-CM | POA: Diagnosis not present

## 2014-03-07 DIAGNOSIS — Z5181 Encounter for therapeutic drug level monitoring: Secondary | ICD-10-CM | POA: Diagnosis not present

## 2014-03-07 DIAGNOSIS — G459 Transient cerebral ischemic attack, unspecified: Secondary | ICD-10-CM | POA: Diagnosis not present

## 2014-03-07 DIAGNOSIS — I480 Paroxysmal atrial fibrillation: Secondary | ICD-10-CM

## 2014-03-07 DIAGNOSIS — I4891 Unspecified atrial fibrillation: Secondary | ICD-10-CM | POA: Diagnosis not present

## 2014-03-07 DIAGNOSIS — I6350 Cerebral infarction due to unspecified occlusion or stenosis of unspecified cerebral artery: Secondary | ICD-10-CM

## 2014-03-07 DIAGNOSIS — M9904 Segmental and somatic dysfunction of sacral region: Secondary | ICD-10-CM | POA: Diagnosis not present

## 2014-03-07 DIAGNOSIS — M9905 Segmental and somatic dysfunction of pelvic region: Secondary | ICD-10-CM | POA: Diagnosis not present

## 2014-03-07 DIAGNOSIS — M5127 Other intervertebral disc displacement, lumbosacral region: Secondary | ICD-10-CM | POA: Diagnosis not present

## 2014-03-07 DIAGNOSIS — M9903 Segmental and somatic dysfunction of lumbar region: Secondary | ICD-10-CM | POA: Diagnosis not present

## 2014-03-07 DIAGNOSIS — M25552 Pain in left hip: Secondary | ICD-10-CM | POA: Diagnosis not present

## 2014-03-07 LAB — POCT INR: INR: 2.2

## 2014-04-04 DIAGNOSIS — M9904 Segmental and somatic dysfunction of sacral region: Secondary | ICD-10-CM | POA: Diagnosis not present

## 2014-04-04 DIAGNOSIS — M9905 Segmental and somatic dysfunction of pelvic region: Secondary | ICD-10-CM | POA: Diagnosis not present

## 2014-04-04 DIAGNOSIS — M9903 Segmental and somatic dysfunction of lumbar region: Secondary | ICD-10-CM | POA: Diagnosis not present

## 2014-04-04 DIAGNOSIS — M25552 Pain in left hip: Secondary | ICD-10-CM | POA: Diagnosis not present

## 2014-04-04 DIAGNOSIS — M5417 Radiculopathy, lumbosacral region: Secondary | ICD-10-CM | POA: Diagnosis not present

## 2014-04-04 DIAGNOSIS — M5127 Other intervertebral disc displacement, lumbosacral region: Secondary | ICD-10-CM | POA: Diagnosis not present

## 2014-04-04 DIAGNOSIS — M25551 Pain in right hip: Secondary | ICD-10-CM | POA: Diagnosis not present

## 2014-04-18 ENCOUNTER — Ambulatory Visit (INDEPENDENT_AMBULATORY_CARE_PROVIDER_SITE_OTHER): Payer: Medicare Other | Admitting: Pharmacist Clinician (PhC)/ Clinical Pharmacy Specialist

## 2014-04-18 DIAGNOSIS — Z5181 Encounter for therapeutic drug level monitoring: Secondary | ICD-10-CM | POA: Diagnosis not present

## 2014-04-18 DIAGNOSIS — G459 Transient cerebral ischemic attack, unspecified: Secondary | ICD-10-CM

## 2014-04-18 DIAGNOSIS — I639 Cerebral infarction, unspecified: Secondary | ICD-10-CM

## 2014-04-18 DIAGNOSIS — I480 Paroxysmal atrial fibrillation: Secondary | ICD-10-CM | POA: Diagnosis not present

## 2014-04-18 DIAGNOSIS — I4891 Unspecified atrial fibrillation: Secondary | ICD-10-CM

## 2014-04-18 DIAGNOSIS — I6350 Cerebral infarction due to unspecified occlusion or stenosis of unspecified cerebral artery: Secondary | ICD-10-CM

## 2014-04-18 LAB — POCT INR: INR: 2.2

## 2014-05-30 ENCOUNTER — Ambulatory Visit (INDEPENDENT_AMBULATORY_CARE_PROVIDER_SITE_OTHER): Payer: Medicare Other | Admitting: Pharmacist Clinician (PhC)/ Clinical Pharmacy Specialist

## 2014-05-30 DIAGNOSIS — I639 Cerebral infarction, unspecified: Secondary | ICD-10-CM

## 2014-05-30 DIAGNOSIS — I4891 Unspecified atrial fibrillation: Secondary | ICD-10-CM | POA: Diagnosis not present

## 2014-05-30 DIAGNOSIS — Z5181 Encounter for therapeutic drug level monitoring: Secondary | ICD-10-CM | POA: Diagnosis not present

## 2014-05-30 DIAGNOSIS — I480 Paroxysmal atrial fibrillation: Secondary | ICD-10-CM

## 2014-05-30 DIAGNOSIS — G459 Transient cerebral ischemic attack, unspecified: Secondary | ICD-10-CM | POA: Diagnosis not present

## 2014-05-30 DIAGNOSIS — I6350 Cerebral infarction due to unspecified occlusion or stenosis of unspecified cerebral artery: Secondary | ICD-10-CM

## 2014-05-30 LAB — POCT INR: INR: 2.8

## 2014-05-31 DIAGNOSIS — H4011X3 Primary open-angle glaucoma, severe stage: Secondary | ICD-10-CM | POA: Diagnosis not present

## 2014-05-31 DIAGNOSIS — M25511 Pain in right shoulder: Secondary | ICD-10-CM | POA: Diagnosis not present

## 2014-05-31 DIAGNOSIS — H4720 Unspecified optic atrophy: Secondary | ICD-10-CM | POA: Diagnosis not present

## 2014-05-31 DIAGNOSIS — H40051 Ocular hypertension, right eye: Secondary | ICD-10-CM | POA: Diagnosis not present

## 2014-05-31 DIAGNOSIS — H04123 Dry eye syndrome of bilateral lacrimal glands: Secondary | ICD-10-CM | POA: Diagnosis not present

## 2014-06-06 DIAGNOSIS — M5417 Radiculopathy, lumbosacral region: Secondary | ICD-10-CM | POA: Diagnosis not present

## 2014-06-06 DIAGNOSIS — M25552 Pain in left hip: Secondary | ICD-10-CM | POA: Diagnosis not present

## 2014-06-06 DIAGNOSIS — M9903 Segmental and somatic dysfunction of lumbar region: Secondary | ICD-10-CM | POA: Diagnosis not present

## 2014-06-06 DIAGNOSIS — M5127 Other intervertebral disc displacement, lumbosacral region: Secondary | ICD-10-CM | POA: Diagnosis not present

## 2014-06-06 DIAGNOSIS — M25551 Pain in right hip: Secondary | ICD-10-CM | POA: Diagnosis not present

## 2014-06-06 DIAGNOSIS — M9905 Segmental and somatic dysfunction of pelvic region: Secondary | ICD-10-CM | POA: Diagnosis not present

## 2014-06-06 DIAGNOSIS — M9904 Segmental and somatic dysfunction of sacral region: Secondary | ICD-10-CM | POA: Diagnosis not present

## 2014-07-11 ENCOUNTER — Ambulatory Visit (INDEPENDENT_AMBULATORY_CARE_PROVIDER_SITE_OTHER): Payer: Medicare Other | Admitting: Pharmacist Clinician (PhC)/ Clinical Pharmacy Specialist

## 2014-07-11 DIAGNOSIS — I639 Cerebral infarction, unspecified: Secondary | ICD-10-CM | POA: Diagnosis not present

## 2014-07-11 DIAGNOSIS — I6350 Cerebral infarction due to unspecified occlusion or stenosis of unspecified cerebral artery: Secondary | ICD-10-CM

## 2014-07-11 DIAGNOSIS — Z5181 Encounter for therapeutic drug level monitoring: Secondary | ICD-10-CM | POA: Diagnosis not present

## 2014-07-11 DIAGNOSIS — G459 Transient cerebral ischemic attack, unspecified: Secondary | ICD-10-CM

## 2014-07-11 DIAGNOSIS — I4891 Unspecified atrial fibrillation: Secondary | ICD-10-CM

## 2014-07-11 DIAGNOSIS — I480 Paroxysmal atrial fibrillation: Secondary | ICD-10-CM

## 2014-07-11 LAB — POCT INR: INR: 2.1

## 2014-08-16 DIAGNOSIS — L570 Actinic keratosis: Secondary | ICD-10-CM | POA: Diagnosis not present

## 2014-08-16 DIAGNOSIS — L300 Nummular dermatitis: Secondary | ICD-10-CM | POA: Diagnosis not present

## 2014-08-22 ENCOUNTER — Ambulatory Visit (INDEPENDENT_AMBULATORY_CARE_PROVIDER_SITE_OTHER): Payer: Medicare Other | Admitting: Pharmacist Clinician (PhC)/ Clinical Pharmacy Specialist

## 2014-08-22 DIAGNOSIS — I639 Cerebral infarction, unspecified: Secondary | ICD-10-CM

## 2014-08-22 DIAGNOSIS — Z5181 Encounter for therapeutic drug level monitoring: Secondary | ICD-10-CM | POA: Diagnosis not present

## 2014-08-22 DIAGNOSIS — G459 Transient cerebral ischemic attack, unspecified: Secondary | ICD-10-CM

## 2014-08-22 DIAGNOSIS — I4891 Unspecified atrial fibrillation: Secondary | ICD-10-CM | POA: Diagnosis not present

## 2014-08-22 DIAGNOSIS — I480 Paroxysmal atrial fibrillation: Secondary | ICD-10-CM

## 2014-08-22 DIAGNOSIS — I6350 Cerebral infarction due to unspecified occlusion or stenosis of unspecified cerebral artery: Secondary | ICD-10-CM

## 2014-08-22 LAB — POCT INR: INR: 2.8

## 2014-08-29 DIAGNOSIS — M5417 Radiculopathy, lumbosacral region: Secondary | ICD-10-CM | POA: Diagnosis not present

## 2014-08-29 DIAGNOSIS — M9903 Segmental and somatic dysfunction of lumbar region: Secondary | ICD-10-CM | POA: Diagnosis not present

## 2014-08-29 DIAGNOSIS — M25551 Pain in right hip: Secondary | ICD-10-CM | POA: Diagnosis not present

## 2014-08-29 DIAGNOSIS — M9904 Segmental and somatic dysfunction of sacral region: Secondary | ICD-10-CM | POA: Diagnosis not present

## 2014-08-29 DIAGNOSIS — M25552 Pain in left hip: Secondary | ICD-10-CM | POA: Diagnosis not present

## 2014-08-29 DIAGNOSIS — M9905 Segmental and somatic dysfunction of pelvic region: Secondary | ICD-10-CM | POA: Diagnosis not present

## 2014-08-29 DIAGNOSIS — M5127 Other intervertebral disc displacement, lumbosacral region: Secondary | ICD-10-CM | POA: Diagnosis not present

## 2014-09-25 DIAGNOSIS — M25552 Pain in left hip: Secondary | ICD-10-CM | POA: Diagnosis not present

## 2014-09-25 DIAGNOSIS — M25551 Pain in right hip: Secondary | ICD-10-CM | POA: Diagnosis not present

## 2014-09-25 DIAGNOSIS — M9904 Segmental and somatic dysfunction of sacral region: Secondary | ICD-10-CM | POA: Diagnosis not present

## 2014-09-25 DIAGNOSIS — M5127 Other intervertebral disc displacement, lumbosacral region: Secondary | ICD-10-CM | POA: Diagnosis not present

## 2014-09-25 DIAGNOSIS — M5417 Radiculopathy, lumbosacral region: Secondary | ICD-10-CM | POA: Diagnosis not present

## 2014-09-25 DIAGNOSIS — M9903 Segmental and somatic dysfunction of lumbar region: Secondary | ICD-10-CM | POA: Diagnosis not present

## 2014-09-25 DIAGNOSIS — M9905 Segmental and somatic dysfunction of pelvic region: Secondary | ICD-10-CM | POA: Diagnosis not present

## 2014-09-26 DIAGNOSIS — M5417 Radiculopathy, lumbosacral region: Secondary | ICD-10-CM | POA: Diagnosis not present

## 2014-09-26 DIAGNOSIS — M25552 Pain in left hip: Secondary | ICD-10-CM | POA: Diagnosis not present

## 2014-09-26 DIAGNOSIS — M5127 Other intervertebral disc displacement, lumbosacral region: Secondary | ICD-10-CM | POA: Diagnosis not present

## 2014-09-26 DIAGNOSIS — M9904 Segmental and somatic dysfunction of sacral region: Secondary | ICD-10-CM | POA: Diagnosis not present

## 2014-09-26 DIAGNOSIS — M9903 Segmental and somatic dysfunction of lumbar region: Secondary | ICD-10-CM | POA: Diagnosis not present

## 2014-09-26 DIAGNOSIS — M25551 Pain in right hip: Secondary | ICD-10-CM | POA: Diagnosis not present

## 2014-09-26 DIAGNOSIS — M9905 Segmental and somatic dysfunction of pelvic region: Secondary | ICD-10-CM | POA: Diagnosis not present

## 2014-09-28 DIAGNOSIS — M5127 Other intervertebral disc displacement, lumbosacral region: Secondary | ICD-10-CM | POA: Diagnosis not present

## 2014-09-28 DIAGNOSIS — M9905 Segmental and somatic dysfunction of pelvic region: Secondary | ICD-10-CM | POA: Diagnosis not present

## 2014-09-28 DIAGNOSIS — M25552 Pain in left hip: Secondary | ICD-10-CM | POA: Diagnosis not present

## 2014-09-28 DIAGNOSIS — M9904 Segmental and somatic dysfunction of sacral region: Secondary | ICD-10-CM | POA: Diagnosis not present

## 2014-09-28 DIAGNOSIS — M25551 Pain in right hip: Secondary | ICD-10-CM | POA: Diagnosis not present

## 2014-09-28 DIAGNOSIS — M5417 Radiculopathy, lumbosacral region: Secondary | ICD-10-CM | POA: Diagnosis not present

## 2014-09-28 DIAGNOSIS — M9903 Segmental and somatic dysfunction of lumbar region: Secondary | ICD-10-CM | POA: Diagnosis not present

## 2014-10-02 DIAGNOSIS — M25552 Pain in left hip: Secondary | ICD-10-CM | POA: Diagnosis not present

## 2014-10-02 DIAGNOSIS — M859 Disorder of bone density and structure, unspecified: Secondary | ICD-10-CM | POA: Diagnosis not present

## 2014-10-02 DIAGNOSIS — M25551 Pain in right hip: Secondary | ICD-10-CM | POA: Diagnosis not present

## 2014-10-02 DIAGNOSIS — M9905 Segmental and somatic dysfunction of pelvic region: Secondary | ICD-10-CM | POA: Diagnosis not present

## 2014-10-02 DIAGNOSIS — Z79899 Other long term (current) drug therapy: Secondary | ICD-10-CM | POA: Diagnosis not present

## 2014-10-02 DIAGNOSIS — M5127 Other intervertebral disc displacement, lumbosacral region: Secondary | ICD-10-CM | POA: Diagnosis not present

## 2014-10-02 DIAGNOSIS — R8290 Unspecified abnormal findings in urine: Secondary | ICD-10-CM | POA: Diagnosis not present

## 2014-10-02 DIAGNOSIS — I679 Cerebrovascular disease, unspecified: Secondary | ICD-10-CM | POA: Diagnosis not present

## 2014-10-02 DIAGNOSIS — M5417 Radiculopathy, lumbosacral region: Secondary | ICD-10-CM | POA: Diagnosis not present

## 2014-10-02 DIAGNOSIS — I2510 Atherosclerotic heart disease of native coronary artery without angina pectoris: Secondary | ICD-10-CM | POA: Diagnosis not present

## 2014-10-02 DIAGNOSIS — M9903 Segmental and somatic dysfunction of lumbar region: Secondary | ICD-10-CM | POA: Diagnosis not present

## 2014-10-02 DIAGNOSIS — M9904 Segmental and somatic dysfunction of sacral region: Secondary | ICD-10-CM | POA: Diagnosis not present

## 2014-10-02 DIAGNOSIS — Z125 Encounter for screening for malignant neoplasm of prostate: Secondary | ICD-10-CM | POA: Diagnosis not present

## 2014-10-02 DIAGNOSIS — R7301 Impaired fasting glucose: Secondary | ICD-10-CM | POA: Diagnosis not present

## 2014-10-03 ENCOUNTER — Ambulatory Visit (INDEPENDENT_AMBULATORY_CARE_PROVIDER_SITE_OTHER): Payer: Medicare Other | Admitting: Pharmacist Clinician (PhC)/ Clinical Pharmacy Specialist

## 2014-10-03 DIAGNOSIS — I4891 Unspecified atrial fibrillation: Secondary | ICD-10-CM | POA: Diagnosis not present

## 2014-10-03 DIAGNOSIS — I639 Cerebral infarction, unspecified: Secondary | ICD-10-CM | POA: Diagnosis not present

## 2014-10-03 DIAGNOSIS — G459 Transient cerebral ischemic attack, unspecified: Secondary | ICD-10-CM | POA: Diagnosis not present

## 2014-10-03 DIAGNOSIS — I480 Paroxysmal atrial fibrillation: Secondary | ICD-10-CM

## 2014-10-03 DIAGNOSIS — I6350 Cerebral infarction due to unspecified occlusion or stenosis of unspecified cerebral artery: Secondary | ICD-10-CM

## 2014-10-03 DIAGNOSIS — Z5181 Encounter for therapeutic drug level monitoring: Secondary | ICD-10-CM

## 2014-10-03 LAB — POCT INR: INR: 3.4

## 2014-10-08 DIAGNOSIS — M9905 Segmental and somatic dysfunction of pelvic region: Secondary | ICD-10-CM | POA: Diagnosis not present

## 2014-10-08 DIAGNOSIS — M25551 Pain in right hip: Secondary | ICD-10-CM | POA: Diagnosis not present

## 2014-10-08 DIAGNOSIS — M5417 Radiculopathy, lumbosacral region: Secondary | ICD-10-CM | POA: Diagnosis not present

## 2014-10-08 DIAGNOSIS — M25552 Pain in left hip: Secondary | ICD-10-CM | POA: Diagnosis not present

## 2014-10-08 DIAGNOSIS — M5127 Other intervertebral disc displacement, lumbosacral region: Secondary | ICD-10-CM | POA: Diagnosis not present

## 2014-10-08 DIAGNOSIS — M9904 Segmental and somatic dysfunction of sacral region: Secondary | ICD-10-CM | POA: Diagnosis not present

## 2014-10-08 DIAGNOSIS — M9903 Segmental and somatic dysfunction of lumbar region: Secondary | ICD-10-CM | POA: Diagnosis not present

## 2014-10-09 DIAGNOSIS — Z1389 Encounter for screening for other disorder: Secondary | ICD-10-CM | POA: Diagnosis not present

## 2014-10-09 DIAGNOSIS — M859 Disorder of bone density and structure, unspecified: Secondary | ICD-10-CM | POA: Diagnosis not present

## 2014-10-09 DIAGNOSIS — I951 Orthostatic hypotension: Secondary | ICD-10-CM | POA: Diagnosis not present

## 2014-10-09 DIAGNOSIS — Z9861 Coronary angioplasty status: Secondary | ICD-10-CM | POA: Diagnosis not present

## 2014-10-09 DIAGNOSIS — Z0000 Encounter for general adult medical examination without abnormal findings: Secondary | ICD-10-CM | POA: Diagnosis not present

## 2014-10-09 DIAGNOSIS — H409 Unspecified glaucoma: Secondary | ICD-10-CM | POA: Diagnosis not present

## 2014-10-09 DIAGNOSIS — N320 Bladder-neck obstruction: Secondary | ICD-10-CM | POA: Diagnosis not present

## 2014-10-09 DIAGNOSIS — M1990 Unspecified osteoarthritis, unspecified site: Secondary | ICD-10-CM | POA: Diagnosis not present

## 2014-10-09 DIAGNOSIS — Z6827 Body mass index (BMI) 27.0-27.9, adult: Secondary | ICD-10-CM | POA: Diagnosis not present

## 2014-10-09 DIAGNOSIS — R7301 Impaired fasting glucose: Secondary | ICD-10-CM | POA: Diagnosis not present

## 2014-10-09 DIAGNOSIS — M5430 Sciatica, unspecified side: Secondary | ICD-10-CM | POA: Diagnosis not present

## 2014-10-09 DIAGNOSIS — R29890 Loss of height: Secondary | ICD-10-CM | POA: Diagnosis not present

## 2014-10-10 DIAGNOSIS — M9903 Segmental and somatic dysfunction of lumbar region: Secondary | ICD-10-CM | POA: Diagnosis not present

## 2014-10-10 DIAGNOSIS — M25551 Pain in right hip: Secondary | ICD-10-CM | POA: Diagnosis not present

## 2014-10-10 DIAGNOSIS — Z1212 Encounter for screening for malignant neoplasm of rectum: Secondary | ICD-10-CM | POA: Diagnosis not present

## 2014-10-10 DIAGNOSIS — M9904 Segmental and somatic dysfunction of sacral region: Secondary | ICD-10-CM | POA: Diagnosis not present

## 2014-10-10 DIAGNOSIS — M9905 Segmental and somatic dysfunction of pelvic region: Secondary | ICD-10-CM | POA: Diagnosis not present

## 2014-10-10 DIAGNOSIS — M5127 Other intervertebral disc displacement, lumbosacral region: Secondary | ICD-10-CM | POA: Diagnosis not present

## 2014-10-10 DIAGNOSIS — M25552 Pain in left hip: Secondary | ICD-10-CM | POA: Diagnosis not present

## 2014-10-10 DIAGNOSIS — M5417 Radiculopathy, lumbosacral region: Secondary | ICD-10-CM | POA: Diagnosis not present

## 2014-10-10 LAB — IFOBT (OCCULT BLOOD): IMMUNOLOGICAL FECAL OCCULT BLOOD TEST: NEGATIVE

## 2014-10-12 DIAGNOSIS — M9904 Segmental and somatic dysfunction of sacral region: Secondary | ICD-10-CM | POA: Diagnosis not present

## 2014-10-12 DIAGNOSIS — M5417 Radiculopathy, lumbosacral region: Secondary | ICD-10-CM | POA: Diagnosis not present

## 2014-10-12 DIAGNOSIS — M5127 Other intervertebral disc displacement, lumbosacral region: Secondary | ICD-10-CM | POA: Diagnosis not present

## 2014-10-12 DIAGNOSIS — M25551 Pain in right hip: Secondary | ICD-10-CM | POA: Diagnosis not present

## 2014-10-12 DIAGNOSIS — M25552 Pain in left hip: Secondary | ICD-10-CM | POA: Diagnosis not present

## 2014-10-12 DIAGNOSIS — M9903 Segmental and somatic dysfunction of lumbar region: Secondary | ICD-10-CM | POA: Diagnosis not present

## 2014-10-12 DIAGNOSIS — M9905 Segmental and somatic dysfunction of pelvic region: Secondary | ICD-10-CM | POA: Diagnosis not present

## 2014-10-15 DIAGNOSIS — M9904 Segmental and somatic dysfunction of sacral region: Secondary | ICD-10-CM | POA: Diagnosis not present

## 2014-10-15 DIAGNOSIS — M5127 Other intervertebral disc displacement, lumbosacral region: Secondary | ICD-10-CM | POA: Diagnosis not present

## 2014-10-15 DIAGNOSIS — M25551 Pain in right hip: Secondary | ICD-10-CM | POA: Diagnosis not present

## 2014-10-15 DIAGNOSIS — M9903 Segmental and somatic dysfunction of lumbar region: Secondary | ICD-10-CM | POA: Diagnosis not present

## 2014-10-15 DIAGNOSIS — M5417 Radiculopathy, lumbosacral region: Secondary | ICD-10-CM | POA: Diagnosis not present

## 2014-10-15 DIAGNOSIS — M25552 Pain in left hip: Secondary | ICD-10-CM | POA: Diagnosis not present

## 2014-10-15 DIAGNOSIS — M9905 Segmental and somatic dysfunction of pelvic region: Secondary | ICD-10-CM | POA: Diagnosis not present

## 2014-10-16 DIAGNOSIS — M5417 Radiculopathy, lumbosacral region: Secondary | ICD-10-CM | POA: Diagnosis not present

## 2014-10-16 DIAGNOSIS — M25551 Pain in right hip: Secondary | ICD-10-CM | POA: Diagnosis not present

## 2014-10-16 DIAGNOSIS — M5127 Other intervertebral disc displacement, lumbosacral region: Secondary | ICD-10-CM | POA: Diagnosis not present

## 2014-10-16 DIAGNOSIS — M9904 Segmental and somatic dysfunction of sacral region: Secondary | ICD-10-CM | POA: Diagnosis not present

## 2014-10-16 DIAGNOSIS — M9903 Segmental and somatic dysfunction of lumbar region: Secondary | ICD-10-CM | POA: Diagnosis not present

## 2014-10-16 DIAGNOSIS — M25552 Pain in left hip: Secondary | ICD-10-CM | POA: Diagnosis not present

## 2014-10-16 DIAGNOSIS — M9905 Segmental and somatic dysfunction of pelvic region: Secondary | ICD-10-CM | POA: Diagnosis not present

## 2014-10-17 DIAGNOSIS — M9905 Segmental and somatic dysfunction of pelvic region: Secondary | ICD-10-CM | POA: Diagnosis not present

## 2014-10-17 DIAGNOSIS — M25551 Pain in right hip: Secondary | ICD-10-CM | POA: Diagnosis not present

## 2014-10-17 DIAGNOSIS — M5127 Other intervertebral disc displacement, lumbosacral region: Secondary | ICD-10-CM | POA: Diagnosis not present

## 2014-10-17 DIAGNOSIS — M9904 Segmental and somatic dysfunction of sacral region: Secondary | ICD-10-CM | POA: Diagnosis not present

## 2014-10-17 DIAGNOSIS — M9903 Segmental and somatic dysfunction of lumbar region: Secondary | ICD-10-CM | POA: Diagnosis not present

## 2014-10-17 DIAGNOSIS — M5417 Radiculopathy, lumbosacral region: Secondary | ICD-10-CM | POA: Diagnosis not present

## 2014-10-17 DIAGNOSIS — M25552 Pain in left hip: Secondary | ICD-10-CM | POA: Diagnosis not present

## 2014-10-18 ENCOUNTER — Other Ambulatory Visit: Payer: Self-pay | Admitting: Pharmacist Clinician (PhC)/ Clinical Pharmacy Specialist

## 2014-10-18 DIAGNOSIS — M9904 Segmental and somatic dysfunction of sacral region: Secondary | ICD-10-CM | POA: Diagnosis not present

## 2014-10-18 DIAGNOSIS — M5127 Other intervertebral disc displacement, lumbosacral region: Secondary | ICD-10-CM | POA: Diagnosis not present

## 2014-10-18 DIAGNOSIS — M25552 Pain in left hip: Secondary | ICD-10-CM | POA: Diagnosis not present

## 2014-10-18 DIAGNOSIS — M5417 Radiculopathy, lumbosacral region: Secondary | ICD-10-CM | POA: Diagnosis not present

## 2014-10-18 DIAGNOSIS — M9903 Segmental and somatic dysfunction of lumbar region: Secondary | ICD-10-CM | POA: Diagnosis not present

## 2014-10-18 DIAGNOSIS — M25551 Pain in right hip: Secondary | ICD-10-CM | POA: Diagnosis not present

## 2014-10-18 DIAGNOSIS — M9905 Segmental and somatic dysfunction of pelvic region: Secondary | ICD-10-CM | POA: Diagnosis not present

## 2014-10-18 MED ORDER — WARFARIN SODIUM 5 MG PO TABS: 5.0000 mg | ORAL_TABLET | Freq: Every day | ORAL | Status: DC

## 2014-10-22 DIAGNOSIS — M5127 Other intervertebral disc displacement, lumbosacral region: Secondary | ICD-10-CM | POA: Diagnosis not present

## 2014-10-22 DIAGNOSIS — M25551 Pain in right hip: Secondary | ICD-10-CM | POA: Diagnosis not present

## 2014-10-22 DIAGNOSIS — M25552 Pain in left hip: Secondary | ICD-10-CM | POA: Diagnosis not present

## 2014-10-22 DIAGNOSIS — M9905 Segmental and somatic dysfunction of pelvic region: Secondary | ICD-10-CM | POA: Diagnosis not present

## 2014-10-22 DIAGNOSIS — M5417 Radiculopathy, lumbosacral region: Secondary | ICD-10-CM | POA: Diagnosis not present

## 2014-10-22 DIAGNOSIS — M9903 Segmental and somatic dysfunction of lumbar region: Secondary | ICD-10-CM | POA: Diagnosis not present

## 2014-10-22 DIAGNOSIS — M9904 Segmental and somatic dysfunction of sacral region: Secondary | ICD-10-CM | POA: Diagnosis not present

## 2014-10-26 DIAGNOSIS — M25551 Pain in right hip: Secondary | ICD-10-CM | POA: Diagnosis not present

## 2014-10-26 DIAGNOSIS — M9905 Segmental and somatic dysfunction of pelvic region: Secondary | ICD-10-CM | POA: Diagnosis not present

## 2014-10-26 DIAGNOSIS — M5417 Radiculopathy, lumbosacral region: Secondary | ICD-10-CM | POA: Diagnosis not present

## 2014-10-26 DIAGNOSIS — M25552 Pain in left hip: Secondary | ICD-10-CM | POA: Diagnosis not present

## 2014-10-26 DIAGNOSIS — M9904 Segmental and somatic dysfunction of sacral region: Secondary | ICD-10-CM | POA: Diagnosis not present

## 2014-10-26 DIAGNOSIS — M5127 Other intervertebral disc displacement, lumbosacral region: Secondary | ICD-10-CM | POA: Diagnosis not present

## 2014-10-26 DIAGNOSIS — M9903 Segmental and somatic dysfunction of lumbar region: Secondary | ICD-10-CM | POA: Diagnosis not present

## 2014-10-31 ENCOUNTER — Ambulatory Visit (INDEPENDENT_AMBULATORY_CARE_PROVIDER_SITE_OTHER): Payer: Medicare Other | Admitting: Pharmacist Clinician (PhC)/ Clinical Pharmacy Specialist

## 2014-10-31 DIAGNOSIS — G459 Transient cerebral ischemic attack, unspecified: Secondary | ICD-10-CM | POA: Diagnosis not present

## 2014-10-31 DIAGNOSIS — I4891 Unspecified atrial fibrillation: Secondary | ICD-10-CM | POA: Diagnosis not present

## 2014-10-31 DIAGNOSIS — Z5181 Encounter for therapeutic drug level monitoring: Secondary | ICD-10-CM | POA: Diagnosis not present

## 2014-10-31 DIAGNOSIS — I480 Paroxysmal atrial fibrillation: Secondary | ICD-10-CM

## 2014-10-31 DIAGNOSIS — I639 Cerebral infarction, unspecified: Secondary | ICD-10-CM | POA: Diagnosis not present

## 2014-10-31 DIAGNOSIS — I6350 Cerebral infarction due to unspecified occlusion or stenosis of unspecified cerebral artery: Secondary | ICD-10-CM

## 2014-10-31 LAB — POCT INR: INR: 3.5

## 2014-11-01 DIAGNOSIS — H01001 Unspecified blepharitis right upper eyelid: Secondary | ICD-10-CM | POA: Diagnosis not present

## 2014-11-01 DIAGNOSIS — H4720 Unspecified optic atrophy: Secondary | ICD-10-CM | POA: Diagnosis not present

## 2014-11-01 DIAGNOSIS — H40051 Ocular hypertension, right eye: Secondary | ICD-10-CM | POA: Diagnosis not present

## 2014-11-01 DIAGNOSIS — H4032X3 Glaucoma secondary to eye trauma, left eye, severe stage: Secondary | ICD-10-CM | POA: Diagnosis not present

## 2014-11-09 ENCOUNTER — Telehealth: Payer: Self-pay | Admitting: Pharmacist Clinician (PhC)/ Clinical Pharmacy Specialist

## 2014-11-09 DIAGNOSIS — M5136 Other intervertebral disc degeneration, lumbar region: Secondary | ICD-10-CM | POA: Diagnosis not present

## 2014-11-09 DIAGNOSIS — M5442 Lumbago with sciatica, left side: Secondary | ICD-10-CM | POA: Diagnosis not present

## 2014-11-09 NOTE — Telephone Encounter (Signed)
Enoxaprin Dosing Schedule  Enoxparin dose: 80  mg  Date  Warfarin Dose (evenings) Enoxaprin Dose   6 Normal dose    5 0    4 0 8 am  8 pm   3 0 8 am  8 pm   2 0 8 am  8 pm   1 0 8 am   Procedure 0 NO INJECTIONS   1 5 mg (1 tab) 8 am  8 pm   2 7.5 mg (1.5 tabs) 8 am  8 pm   3 7.5 mg (1.5 tabs) 8 am  8 pm   4 5 mg (1 tab) 8 am  8 pm   5 5 mg (1 tab) 8 am  8 pm   6 Repeat INR

## 2014-11-12 ENCOUNTER — Telehealth: Payer: Self-pay | Admitting: *Deleted

## 2014-11-12 DIAGNOSIS — M5127 Other intervertebral disc displacement, lumbosacral region: Secondary | ICD-10-CM | POA: Diagnosis not present

## 2014-11-12 DIAGNOSIS — M9903 Segmental and somatic dysfunction of lumbar region: Secondary | ICD-10-CM | POA: Diagnosis not present

## 2014-11-12 DIAGNOSIS — M5417 Radiculopathy, lumbosacral region: Secondary | ICD-10-CM | POA: Diagnosis not present

## 2014-11-12 DIAGNOSIS — M9905 Segmental and somatic dysfunction of pelvic region: Secondary | ICD-10-CM | POA: Diagnosis not present

## 2014-11-12 DIAGNOSIS — M9904 Segmental and somatic dysfunction of sacral region: Secondary | ICD-10-CM | POA: Diagnosis not present

## 2014-11-12 DIAGNOSIS — M25552 Pain in left hip: Secondary | ICD-10-CM | POA: Diagnosis not present

## 2014-11-12 DIAGNOSIS — M25551 Pain in right hip: Secondary | ICD-10-CM | POA: Diagnosis not present

## 2014-11-12 MED ORDER — ENOXAPARIN SODIUM 80 MG/0.8ML ~~LOC~~ SOLN: 80.0000 mg | Freq: Two times a day (BID) | SUBCUTANEOUS | Status: DC

## 2014-11-12 NOTE — Telephone Encounter (Signed)
Pt called Monday August 15, spinal injection set for August 30.  Received labs from [facility], Scr 0.9; CrCl 66.  Will dose at 80 mg bid and send Rx to [facility] on [place] patient, reviewed dates.  Appointments set for day of injection and follow up

## 2014-11-12 NOTE — Telephone Encounter (Signed)
[facility] request clearance as noted below:   1. Type of surgery: injection  2. Date of surgery: 11/27/2014 3. Surgeon: not noted - fax from [place] 4. Medications that need to be held & how long: warfarin - 5 days 5. Fax and/or Phone: [phone]) [phone] ext: [phone]   [phone]

## 2014-11-12 NOTE — Telephone Encounter (Signed)
Pt with history of TIA/stroke, will bridge with lovenox.  See telephone message.  Will send clearance note to [facility]

## 2014-11-19 ENCOUNTER — Ambulatory Visit: Payer: Medicare Other | Admitting: Pharmacist Clinician (PhC)/ Clinical Pharmacy Specialist

## 2014-11-19 DIAGNOSIS — M5417 Radiculopathy, lumbosacral region: Secondary | ICD-10-CM | POA: Diagnosis not present

## 2014-11-19 DIAGNOSIS — M25552 Pain in left hip: Secondary | ICD-10-CM | POA: Diagnosis not present

## 2014-11-19 DIAGNOSIS — M5127 Other intervertebral disc displacement, lumbosacral region: Secondary | ICD-10-CM | POA: Diagnosis not present

## 2014-11-19 DIAGNOSIS — M9903 Segmental and somatic dysfunction of lumbar region: Secondary | ICD-10-CM | POA: Diagnosis not present

## 2014-11-19 DIAGNOSIS — M25551 Pain in right hip: Secondary | ICD-10-CM | POA: Diagnosis not present

## 2014-11-19 DIAGNOSIS — M9904 Segmental and somatic dysfunction of sacral region: Secondary | ICD-10-CM | POA: Diagnosis not present

## 2014-11-19 DIAGNOSIS — M9905 Segmental and somatic dysfunction of pelvic region: Secondary | ICD-10-CM | POA: Diagnosis not present

## 2014-11-20 ENCOUNTER — Ambulatory Visit: Payer: Medicare Other

## 2014-11-26 ENCOUNTER — Ambulatory Visit: Payer: Medicare Other

## 2014-11-27 ENCOUNTER — Ambulatory Visit: Payer: Medicare Other

## 2014-11-27 ENCOUNTER — Ambulatory Visit (INDEPENDENT_AMBULATORY_CARE_PROVIDER_SITE_OTHER): Payer: Medicare Other | Admitting: Pharmacist Clinician (PhC)/ Clinical Pharmacy Specialist

## 2014-11-27 DIAGNOSIS — I639 Cerebral infarction, unspecified: Secondary | ICD-10-CM

## 2014-11-27 DIAGNOSIS — I480 Paroxysmal atrial fibrillation: Secondary | ICD-10-CM | POA: Diagnosis not present

## 2014-11-27 DIAGNOSIS — M5136 Other intervertebral disc degeneration, lumbar region: Secondary | ICD-10-CM | POA: Diagnosis not present

## 2014-11-27 DIAGNOSIS — I4891 Unspecified atrial fibrillation: Secondary | ICD-10-CM | POA: Diagnosis not present

## 2014-11-27 DIAGNOSIS — Z5181 Encounter for therapeutic drug level monitoring: Secondary | ICD-10-CM

## 2014-11-27 DIAGNOSIS — G459 Transient cerebral ischemic attack, unspecified: Secondary | ICD-10-CM

## 2014-11-27 DIAGNOSIS — I6350 Cerebral infarction due to unspecified occlusion or stenosis of unspecified cerebral artery: Secondary | ICD-10-CM

## 2014-11-27 LAB — POCT INR: INR: 1.1

## 2014-12-04 ENCOUNTER — Other Ambulatory Visit: Payer: Self-pay | Admitting: Cardiology

## 2014-12-04 ENCOUNTER — Ambulatory Visit (INDEPENDENT_AMBULATORY_CARE_PROVIDER_SITE_OTHER): Payer: Medicare Other | Admitting: *Deleted

## 2014-12-04 DIAGNOSIS — Z5181 Encounter for therapeutic drug level monitoring: Secondary | ICD-10-CM

## 2014-12-04 DIAGNOSIS — I6350 Cerebral infarction due to unspecified occlusion or stenosis of unspecified cerebral artery: Secondary | ICD-10-CM

## 2014-12-04 DIAGNOSIS — I480 Paroxysmal atrial fibrillation: Secondary | ICD-10-CM

## 2014-12-04 DIAGNOSIS — I4891 Unspecified atrial fibrillation: Secondary | ICD-10-CM

## 2014-12-04 DIAGNOSIS — I639 Cerebral infarction, unspecified: Secondary | ICD-10-CM | POA: Diagnosis not present

## 2014-12-04 DIAGNOSIS — G459 Transient cerebral ischemic attack, unspecified: Secondary | ICD-10-CM | POA: Diagnosis not present

## 2014-12-04 LAB — POCT INR: INR: 1.8

## 2014-12-04 NOTE — Telephone Encounter (Signed)
REFILL 

## 2014-12-12 DIAGNOSIS — M25552 Pain in left hip: Secondary | ICD-10-CM | POA: Diagnosis not present

## 2014-12-12 DIAGNOSIS — M9904 Segmental and somatic dysfunction of sacral region: Secondary | ICD-10-CM | POA: Diagnosis not present

## 2014-12-12 DIAGNOSIS — M9905 Segmental and somatic dysfunction of pelvic region: Secondary | ICD-10-CM | POA: Diagnosis not present

## 2014-12-12 DIAGNOSIS — M5417 Radiculopathy, lumbosacral region: Secondary | ICD-10-CM | POA: Diagnosis not present

## 2014-12-12 DIAGNOSIS — M9903 Segmental and somatic dysfunction of lumbar region: Secondary | ICD-10-CM | POA: Diagnosis not present

## 2014-12-12 DIAGNOSIS — M25551 Pain in right hip: Secondary | ICD-10-CM | POA: Diagnosis not present

## 2014-12-12 DIAGNOSIS — M5127 Other intervertebral disc displacement, lumbosacral region: Secondary | ICD-10-CM | POA: Diagnosis not present

## 2014-12-14 DIAGNOSIS — Z8546 Personal history of malignant neoplasm of prostate: Secondary | ICD-10-CM | POA: Diagnosis not present

## 2014-12-17 ENCOUNTER — Ambulatory Visit (INDEPENDENT_AMBULATORY_CARE_PROVIDER_SITE_OTHER): Payer: Medicare Other | Admitting: Pharmacist Clinician (PhC)/ Clinical Pharmacy Specialist

## 2014-12-17 DIAGNOSIS — I639 Cerebral infarction, unspecified: Secondary | ICD-10-CM | POA: Diagnosis not present

## 2014-12-17 DIAGNOSIS — Z5181 Encounter for therapeutic drug level monitoring: Secondary | ICD-10-CM | POA: Diagnosis not present

## 2014-12-17 DIAGNOSIS — I4891 Unspecified atrial fibrillation: Secondary | ICD-10-CM

## 2014-12-17 DIAGNOSIS — I6350 Cerebral infarction due to unspecified occlusion or stenosis of unspecified cerebral artery: Secondary | ICD-10-CM

## 2014-12-17 DIAGNOSIS — I480 Paroxysmal atrial fibrillation: Secondary | ICD-10-CM

## 2014-12-17 DIAGNOSIS — G459 Transient cerebral ischemic attack, unspecified: Secondary | ICD-10-CM | POA: Diagnosis not present

## 2014-12-17 LAB — POCT INR: INR: 1.2

## 2014-12-19 DIAGNOSIS — M5417 Radiculopathy, lumbosacral region: Secondary | ICD-10-CM | POA: Diagnosis not present

## 2014-12-19 DIAGNOSIS — M25552 Pain in left hip: Secondary | ICD-10-CM | POA: Diagnosis not present

## 2014-12-19 DIAGNOSIS — M25551 Pain in right hip: Secondary | ICD-10-CM | POA: Diagnosis not present

## 2014-12-19 DIAGNOSIS — M9904 Segmental and somatic dysfunction of sacral region: Secondary | ICD-10-CM | POA: Diagnosis not present

## 2014-12-19 DIAGNOSIS — M9903 Segmental and somatic dysfunction of lumbar region: Secondary | ICD-10-CM | POA: Diagnosis not present

## 2014-12-19 DIAGNOSIS — M9905 Segmental and somatic dysfunction of pelvic region: Secondary | ICD-10-CM | POA: Diagnosis not present

## 2014-12-19 DIAGNOSIS — M5127 Other intervertebral disc displacement, lumbosacral region: Secondary | ICD-10-CM | POA: Diagnosis not present

## 2014-12-21 DIAGNOSIS — Z8546 Personal history of malignant neoplasm of prostate: Secondary | ICD-10-CM | POA: Diagnosis not present

## 2014-12-25 DIAGNOSIS — M25551 Pain in right hip: Secondary | ICD-10-CM | POA: Diagnosis not present

## 2014-12-25 DIAGNOSIS — M9904 Segmental and somatic dysfunction of sacral region: Secondary | ICD-10-CM | POA: Diagnosis not present

## 2014-12-25 DIAGNOSIS — M5417 Radiculopathy, lumbosacral region: Secondary | ICD-10-CM | POA: Diagnosis not present

## 2014-12-25 DIAGNOSIS — M9905 Segmental and somatic dysfunction of pelvic region: Secondary | ICD-10-CM | POA: Diagnosis not present

## 2014-12-25 DIAGNOSIS — M9903 Segmental and somatic dysfunction of lumbar region: Secondary | ICD-10-CM | POA: Diagnosis not present

## 2014-12-25 DIAGNOSIS — M5127 Other intervertebral disc displacement, lumbosacral region: Secondary | ICD-10-CM | POA: Diagnosis not present

## 2014-12-25 DIAGNOSIS — M25552 Pain in left hip: Secondary | ICD-10-CM | POA: Diagnosis not present

## 2014-12-26 ENCOUNTER — Ambulatory Visit (INDEPENDENT_AMBULATORY_CARE_PROVIDER_SITE_OTHER): Payer: Medicare Other | Admitting: Pharmacist Clinician (PhC)/ Clinical Pharmacy Specialist

## 2014-12-26 DIAGNOSIS — I4891 Unspecified atrial fibrillation: Secondary | ICD-10-CM

## 2014-12-26 DIAGNOSIS — I480 Paroxysmal atrial fibrillation: Secondary | ICD-10-CM | POA: Diagnosis not present

## 2014-12-26 DIAGNOSIS — Z5181 Encounter for therapeutic drug level monitoring: Secondary | ICD-10-CM

## 2014-12-26 DIAGNOSIS — I639 Cerebral infarction, unspecified: Secondary | ICD-10-CM | POA: Diagnosis not present

## 2014-12-26 DIAGNOSIS — G459 Transient cerebral ischemic attack, unspecified: Secondary | ICD-10-CM

## 2014-12-26 DIAGNOSIS — I6350 Cerebral infarction due to unspecified occlusion or stenosis of unspecified cerebral artery: Secondary | ICD-10-CM

## 2014-12-26 LAB — POCT INR: INR: 2

## 2015-01-09 DIAGNOSIS — M9904 Segmental and somatic dysfunction of sacral region: Secondary | ICD-10-CM | POA: Diagnosis not present

## 2015-01-09 DIAGNOSIS — M5127 Other intervertebral disc displacement, lumbosacral region: Secondary | ICD-10-CM | POA: Diagnosis not present

## 2015-01-09 DIAGNOSIS — M9905 Segmental and somatic dysfunction of pelvic region: Secondary | ICD-10-CM | POA: Diagnosis not present

## 2015-01-09 DIAGNOSIS — M25551 Pain in right hip: Secondary | ICD-10-CM | POA: Diagnosis not present

## 2015-01-09 DIAGNOSIS — M5417 Radiculopathy, lumbosacral region: Secondary | ICD-10-CM | POA: Diagnosis not present

## 2015-01-09 DIAGNOSIS — M25552 Pain in left hip: Secondary | ICD-10-CM | POA: Diagnosis not present

## 2015-01-09 DIAGNOSIS — M9903 Segmental and somatic dysfunction of lumbar region: Secondary | ICD-10-CM | POA: Diagnosis not present

## 2015-01-25 NOTE — Progress Notes (Signed)
HPI: FU CAD. He is status post coronary bypassing graft in 1990; also with h/o ischemic cardiomyopathy, prior stroke and prior mural apical thrombus on chronic coumadin Rx. Prior abdominal ultrasound in May 2006 showed no aneurysm. Carotid Dopplers in November of 2005 showed 0-39% bilaterally. MRA in October of 2007 showed no obstructive disease. LHC 09/24/11: LM 30, oLAD 100%, dLAD fills via RV branch off RCA, IM diffuse 95% subtotal disease, mCFX 40%, OM1 normal, RCA normal with large RV branch supplying dLAD, PLA possibly occluded and most dRCA appears to fill from LIMA collats; LIMA patent but does not supply dLAD (connects via small vessel to ? DRCA/PLA, S-IM occluded). Circulation felt to be stable. Continue med Rx recommended. Echocardiogram repeated August 2015. There was a small layered apical thrombus. Ejection fraction 50-55%. Grade 1 diastolic dysfunction. Trace aortic insufficiency and mitral regurgitation. Mild left atrial enlargement. Moderately elevated pulmonary pressures. Since last seen, the patient has dyspnea with more extreme activities but not with routine activities. It is relieved with rest. It is not associated with chest pain. There is no orthopnea, PND or pedal edema. There is no syncope or palpitations. There is no exertional chest pain.   Current Outpatient Prescriptions  Medication Sig Dispense Refill  . calcium-vitamin D 250-100 MG-UNIT per tablet Take 1 tablet by mouth daily.     . Cholecalciferol (VITAMIN D) 2000 UNITS tablet Take 2,000 Units by mouth daily.    . fenofibrate micronized (LOFIBRA) 200 MG capsule TAKE 1 BY MOUTH DAILY 90 capsule 0  . Fluticasone Furoate-Vilanterol (BREO ELLIPTA) 100-25 MCG/INH AEPB Inhale into the lungs every morning.    . latanoprost (XALATAN) 0.005 % ophthalmic solution Place 1 drop into the left eye at bedtime as needed and may repeat dose one time if needed.     . Multiple Vitamin (MULTIVITAMIN WITH MINERALS) TABS tablet Take 1  tablet by mouth daily.    . pravastatin (PRAVACHOL) 40 MG tablet Take 1 tablet (40 mg total) by mouth at bedtime. 90 tablet 4  . ramipril (ALTACE) 2.5 MG capsule Take 2.5 mg by mouth every evening.    . Tamsulosin HCl (FLOMAX) 0.4 MG CAPS Take 0.4 mg by mouth every evening.     . warfarin (COUMADIN) 5 MG tablet Take 1 tablet (5 mg total) by mouth daily. As directed per coumadin clinic 90 tablet 1   No current facility-administered medications for this visit.     Past Medical History  Diagnosis Date  . Post-infarction apical thrombus ([place])   . CAD (coronary artery disease)   . Ischemic cardiomyopathy   . CVA (cerebral infarction) 2012    tia, mild no residual defecits  . Asthma     with worsening with beta blockade  . GI bleed 8 yrs ago    due to doll fully vessel which was clipped  . Glaucoma     excellent cataracts  . Nephrolithiasis   . Prostate cancer ([place]) 2012  . Tubular adenoma 04/2013    Dr. Hilarie Fredrickson  . Radiation proctitis Feb 2015    treated with APC    Past Surgical History  Procedure Laterality Date  . Cardiac stents  2003  . Coronary artery bypass graft  1998    LIMA to the LAD and saphenous vein graft tothe diagonal  . History of radiation treatment  2012    x 40 treatments done  . Colonoscopy with propofol N/A 05/05/2013    Procedure: COLONOSCOPY WITH PROPOFOL;  Surgeon: Ulice Dash  Everitt Amber, MD;  Location: [facility];  Service: Gastroenterology;  Laterality: N/A;    Social History   Social History  . Marital Status: Married    Spouse Name: N/A  . Number of Children: 3  . Years of Education: N/A   Occupational History  . Retired Conservation officer, [employer]    Social History Main Topics  . Smoking status: Never Smoker   . Smokeless tobacco: Never Used  . Alcohol Use: 0.0 oz/week    0 Standard drinks or equivalent per week     Comment: occasional beer or wine  . Drug Use: No  . Sexual Activity: Not on file   Other Topics Concern  . Not on file   Social History  Narrative    ROS: no fevers or chills, productive cough, hemoptysis, dysphasia, odynophagia, melena, hematochezia, dysuria, hematuria, rash, seizure activity, orthopnea, PND, pedal edema, claudication. Remaining systems are negative.  Physical Exam: Well-developed well-nourished in no acute distress.  Skin is warm and dry.  HEENT is normal.  Neck is supple.  Chest is clear to auscultation with normal expansion.  Cardiovascular exam is regular rate and rhythm.  Abdominal exam nontender or distended. No masses palpated. Extremities show no edema. neuro grossly intact  ECG NSR IRBBB, nonspecific ST changes

## 2015-01-28 ENCOUNTER — Ambulatory Visit (INDEPENDENT_AMBULATORY_CARE_PROVIDER_SITE_OTHER): Payer: Medicare Other | Admitting: Cardiology

## 2015-01-28 ENCOUNTER — Ambulatory Visit (INDEPENDENT_AMBULATORY_CARE_PROVIDER_SITE_OTHER): Payer: Medicare Other | Admitting: *Deleted

## 2015-01-28 ENCOUNTER — Encounter: Payer: Self-pay | Admitting: Cardiology

## 2015-01-28 VITALS — BP 144/62 | HR 70 | Ht 70.0 in | Wt 186.5 lb

## 2015-01-28 DIAGNOSIS — G459 Transient cerebral ischemic attack, unspecified: Secondary | ICD-10-CM

## 2015-01-28 DIAGNOSIS — I4891 Unspecified atrial fibrillation: Secondary | ICD-10-CM | POA: Diagnosis not present

## 2015-01-28 DIAGNOSIS — E7800 Pure hypercholesterolemia, unspecified: Secondary | ICD-10-CM | POA: Diagnosis not present

## 2015-01-28 DIAGNOSIS — I2510 Atherosclerotic heart disease of native coronary artery without angina pectoris: Secondary | ICD-10-CM | POA: Diagnosis not present

## 2015-01-28 DIAGNOSIS — I6350 Cerebral infarction due to unspecified occlusion or stenosis of unspecified cerebral artery: Secondary | ICD-10-CM | POA: Diagnosis not present

## 2015-01-28 DIAGNOSIS — I480 Paroxysmal atrial fibrillation: Secondary | ICD-10-CM

## 2015-01-28 DIAGNOSIS — Z5181 Encounter for therapeutic drug level monitoring: Secondary | ICD-10-CM

## 2015-01-28 LAB — POCT INR: INR: 2.3

## 2015-01-28 NOTE — Patient Instructions (Signed)
Your physician wants you to follow-up in: ONE YEAR WITH DR CRENSHAW You will receive a reminder letter in the mail two months in advance. If you don't receive a letter, please call our office to schedule the follow-up appointment.  

## 2015-01-28 NOTE — Assessment & Plan Note (Signed)
Not on aspirin given need for Coumadin. Continue statin.

## 2015-01-28 NOTE — Assessment & Plan Note (Addendum)
Continue statin. Lipids and liver monitored by Primary care.

## 2015-01-28 NOTE — Assessment & Plan Note (Signed)
LV function improved on most recent echocardiogram. Continue ACE inhibitor. Intolerant to beta blockers.

## 2015-01-28 NOTE — Assessment & Plan Note (Signed)
Given history of TIA and continued evidence of thrombus on echocardiogram will continue Coumadin long-term.

## 2015-01-30 DIAGNOSIS — M5417 Radiculopathy, lumbosacral region: Secondary | ICD-10-CM | POA: Diagnosis not present

## 2015-01-30 DIAGNOSIS — M25551 Pain in right hip: Secondary | ICD-10-CM | POA: Diagnosis not present

## 2015-01-30 DIAGNOSIS — M9903 Segmental and somatic dysfunction of lumbar region: Secondary | ICD-10-CM | POA: Diagnosis not present

## 2015-01-30 DIAGNOSIS — M9904 Segmental and somatic dysfunction of sacral region: Secondary | ICD-10-CM | POA: Diagnosis not present

## 2015-01-30 DIAGNOSIS — M5127 Other intervertebral disc displacement, lumbosacral region: Secondary | ICD-10-CM | POA: Diagnosis not present

## 2015-01-30 DIAGNOSIS — M9905 Segmental and somatic dysfunction of pelvic region: Secondary | ICD-10-CM | POA: Diagnosis not present

## 2015-01-30 DIAGNOSIS — M25552 Pain in left hip: Secondary | ICD-10-CM | POA: Diagnosis not present

## 2015-02-14 DIAGNOSIS — L570 Actinic keratosis: Secondary | ICD-10-CM | POA: Diagnosis not present

## 2015-02-14 DIAGNOSIS — L300 Nummular dermatitis: Secondary | ICD-10-CM | POA: Diagnosis not present

## 2015-02-14 DIAGNOSIS — B353 Tinea pedis: Secondary | ICD-10-CM | POA: Diagnosis not present

## 2015-02-25 ENCOUNTER — Other Ambulatory Visit: Payer: Self-pay | Admitting: Cardiology

## 2015-02-25 ENCOUNTER — Ambulatory Visit (INDEPENDENT_AMBULATORY_CARE_PROVIDER_SITE_OTHER): Payer: Medicare Other | Admitting: Pharmacist Clinician (PhC)/ Clinical Pharmacy Specialist

## 2015-02-25 DIAGNOSIS — I480 Paroxysmal atrial fibrillation: Secondary | ICD-10-CM | POA: Diagnosis not present

## 2015-02-25 DIAGNOSIS — I4891 Unspecified atrial fibrillation: Secondary | ICD-10-CM | POA: Diagnosis not present

## 2015-02-25 DIAGNOSIS — G459 Transient cerebral ischemic attack, unspecified: Secondary | ICD-10-CM

## 2015-02-25 DIAGNOSIS — Z5181 Encounter for therapeutic drug level monitoring: Secondary | ICD-10-CM

## 2015-02-25 DIAGNOSIS — I6350 Cerebral infarction due to unspecified occlusion or stenosis of unspecified cerebral artery: Secondary | ICD-10-CM | POA: Diagnosis not present

## 2015-02-25 LAB — POCT INR: INR: 3.1

## 2015-02-25 NOTE — Telephone Encounter (Signed)
Rx request sent to pharmacy.  

## 2015-04-03 ENCOUNTER — Ambulatory Visit (INDEPENDENT_AMBULATORY_CARE_PROVIDER_SITE_OTHER): Payer: Medicare Other | Admitting: Pharmacist Clinician (PhC)/ Clinical Pharmacy Specialist

## 2015-04-03 DIAGNOSIS — I6350 Cerebral infarction due to unspecified occlusion or stenosis of unspecified cerebral artery: Secondary | ICD-10-CM

## 2015-04-03 DIAGNOSIS — I4891 Unspecified atrial fibrillation: Secondary | ICD-10-CM | POA: Diagnosis not present

## 2015-04-03 DIAGNOSIS — Z5181 Encounter for therapeutic drug level monitoring: Secondary | ICD-10-CM

## 2015-04-03 DIAGNOSIS — I480 Paroxysmal atrial fibrillation: Secondary | ICD-10-CM | POA: Diagnosis not present

## 2015-04-03 DIAGNOSIS — G459 Transient cerebral ischemic attack, unspecified: Secondary | ICD-10-CM | POA: Diagnosis not present

## 2015-04-03 LAB — POCT INR: INR: 2.9

## 2015-04-04 DIAGNOSIS — H04123 Dry eye syndrome of bilateral lacrimal glands: Secondary | ICD-10-CM | POA: Diagnosis not present

## 2015-04-04 DIAGNOSIS — H4032X3 Glaucoma secondary to eye trauma, left eye, severe stage: Secondary | ICD-10-CM | POA: Diagnosis not present

## 2015-04-04 DIAGNOSIS — H40011 Open angle with borderline findings, low risk, right eye: Secondary | ICD-10-CM | POA: Diagnosis not present

## 2015-04-04 DIAGNOSIS — H16103 Unspecified superficial keratitis, bilateral: Secondary | ICD-10-CM | POA: Diagnosis not present

## 2015-04-11 DIAGNOSIS — I679 Cerebrovascular disease, unspecified: Secondary | ICD-10-CM | POA: Diagnosis not present

## 2015-04-11 DIAGNOSIS — M1990 Unspecified osteoarthritis, unspecified site: Secondary | ICD-10-CM | POA: Diagnosis not present

## 2015-04-11 DIAGNOSIS — Z1389 Encounter for screening for other disorder: Secondary | ICD-10-CM | POA: Diagnosis not present

## 2015-04-11 DIAGNOSIS — Z9861 Coronary angioplasty status: Secondary | ICD-10-CM | POA: Diagnosis not present

## 2015-04-11 DIAGNOSIS — M859 Disorder of bone density and structure, unspecified: Secondary | ICD-10-CM | POA: Diagnosis not present

## 2015-04-11 DIAGNOSIS — J449 Chronic obstructive pulmonary disease, unspecified: Secondary | ICD-10-CM | POA: Diagnosis not present

## 2015-04-11 DIAGNOSIS — Z8546 Personal history of malignant neoplasm of prostate: Secondary | ICD-10-CM | POA: Diagnosis not present

## 2015-04-11 DIAGNOSIS — I509 Heart failure, unspecified: Secondary | ICD-10-CM | POA: Diagnosis not present

## 2015-04-11 DIAGNOSIS — N320 Bladder-neck obstruction: Secondary | ICD-10-CM | POA: Diagnosis not present

## 2015-04-11 DIAGNOSIS — M5430 Sciatica, unspecified side: Secondary | ICD-10-CM | POA: Diagnosis not present

## 2015-04-11 DIAGNOSIS — Z6827 Body mass index (BMI) 27.0-27.9, adult: Secondary | ICD-10-CM | POA: Diagnosis not present

## 2015-04-11 DIAGNOSIS — R7301 Impaired fasting glucose: Secondary | ICD-10-CM | POA: Diagnosis not present

## 2015-04-24 DIAGNOSIS — M9904 Segmental and somatic dysfunction of sacral region: Secondary | ICD-10-CM | POA: Diagnosis not present

## 2015-04-24 DIAGNOSIS — M25551 Pain in right hip: Secondary | ICD-10-CM | POA: Diagnosis not present

## 2015-04-24 DIAGNOSIS — M25552 Pain in left hip: Secondary | ICD-10-CM | POA: Diagnosis not present

## 2015-04-24 DIAGNOSIS — M9903 Segmental and somatic dysfunction of lumbar region: Secondary | ICD-10-CM | POA: Diagnosis not present

## 2015-04-24 DIAGNOSIS — M9905 Segmental and somatic dysfunction of pelvic region: Secondary | ICD-10-CM | POA: Diagnosis not present

## 2015-04-24 DIAGNOSIS — M5417 Radiculopathy, lumbosacral region: Secondary | ICD-10-CM | POA: Diagnosis not present

## 2015-04-24 DIAGNOSIS — M5127 Other intervertebral disc displacement, lumbosacral region: Secondary | ICD-10-CM | POA: Diagnosis not present

## 2015-04-26 DIAGNOSIS — Z0000 Encounter for general adult medical examination without abnormal findings: Secondary | ICD-10-CM | POA: Diagnosis not present

## 2015-04-26 DIAGNOSIS — N320 Bladder-neck obstruction: Secondary | ICD-10-CM | POA: Diagnosis not present

## 2015-04-26 DIAGNOSIS — R351 Nocturia: Secondary | ICD-10-CM | POA: Diagnosis not present

## 2015-04-26 DIAGNOSIS — R3129 Other microscopic hematuria: Secondary | ICD-10-CM | POA: Diagnosis not present

## 2015-04-26 DIAGNOSIS — Z8546 Personal history of malignant neoplasm of prostate: Secondary | ICD-10-CM | POA: Diagnosis not present

## 2015-05-01 DIAGNOSIS — R310 Gross hematuria: Secondary | ICD-10-CM | POA: Diagnosis not present

## 2015-05-01 DIAGNOSIS — N200 Calculus of kidney: Secondary | ICD-10-CM | POA: Diagnosis not present

## 2015-05-07 ENCOUNTER — Other Ambulatory Visit: Payer: Self-pay | Admitting: Pharmacist Clinician (PhC)/ Clinical Pharmacy Specialist

## 2015-05-07 MED ORDER — WARFARIN SODIUM 5 MG PO TABS: 5.0000 mg | ORAL_TABLET | Freq: Every day | ORAL | Status: DC

## 2015-05-08 ENCOUNTER — Other Ambulatory Visit: Payer: Self-pay | Admitting: Pharmacist Clinician (PhC)/ Clinical Pharmacy Specialist

## 2015-05-08 MED ORDER — WARFARIN SODIUM 5 MG PO TABS: 5.0000 mg | ORAL_TABLET | Freq: Every day | ORAL | Status: DC

## 2015-05-15 ENCOUNTER — Ambulatory Visit (INDEPENDENT_AMBULATORY_CARE_PROVIDER_SITE_OTHER): Payer: Medicare Other | Admitting: Pharmacist Clinician (PhC)/ Clinical Pharmacy Specialist

## 2015-05-15 ENCOUNTER — Other Ambulatory Visit: Payer: Self-pay | Admitting: Urology

## 2015-05-15 ENCOUNTER — Other Ambulatory Visit: Payer: Self-pay | Admitting: Pharmacist Clinician (PhC)/ Clinical Pharmacy Specialist

## 2015-05-15 DIAGNOSIS — I480 Paroxysmal atrial fibrillation: Secondary | ICD-10-CM

## 2015-05-15 DIAGNOSIS — I6350 Cerebral infarction due to unspecified occlusion or stenosis of unspecified cerebral artery: Secondary | ICD-10-CM

## 2015-05-15 DIAGNOSIS — I4891 Unspecified atrial fibrillation: Secondary | ICD-10-CM | POA: Diagnosis not present

## 2015-05-15 DIAGNOSIS — G459 Transient cerebral ischemic attack, unspecified: Secondary | ICD-10-CM

## 2015-05-15 DIAGNOSIS — Z5181 Encounter for therapeutic drug level monitoring: Secondary | ICD-10-CM | POA: Diagnosis not present

## 2015-05-15 LAB — POCT INR: INR: 2.5

## 2015-05-15 MED ORDER — ENOXAPARIN SODIUM 80 MG/0.8ML ~~LOC~~ SOLN: SUBCUTANEOUS | Status: DC

## 2015-05-15 NOTE — Patient Instructions (Addendum)
Enoxaprin Dosing Schedule  Enoxparin dose: 80 mg  Date  Warfarin Dose (evenings) Enoxaprin Dose   6 Normal dose    5 0    4 0 8 am     8 pm   3 0 8 am     8 pm   2 0 8 am     8 pm   1 0 8 am   Procedure Normal dose    1 1.5 tablets 8 am     8 pm   2 1.5 tablets 8 am     8 pm   3 1.5 tablets 8 am     8 pm   4 Normal dose 8 am     8 pm   5 Normal dose 8 am     8 pm   6 Repeat INR

## 2015-06-04 DIAGNOSIS — M25561 Pain in right knee: Secondary | ICD-10-CM | POA: Diagnosis not present

## 2015-06-07 ENCOUNTER — Encounter (HOSPITAL_COMMUNITY): Payer: Self-pay | Admitting: General Practice

## 2015-06-10 ENCOUNTER — Encounter (HOSPITAL_COMMUNITY): Payer: Self-pay

## 2015-06-10 ENCOUNTER — Ambulatory Visit (HOSPITAL_COMMUNITY): Payer: Medicare Other

## 2015-06-10 ENCOUNTER — Encounter (HOSPITAL_COMMUNITY)
Admission: RE | Disposition: A | Discharge status: Home / Self Care | Payer: Self-pay | Source: Ambulatory Visit | Attending: Urology

## 2015-06-10 ENCOUNTER — Ambulatory Visit (HOSPITAL_COMMUNITY)
Admission: RE | Admit: 2015-06-10 | Discharge: 2015-06-10 | Disposition: A | Discharge status: Home / Self Care | Payer: Medicare Other | Source: Ambulatory Visit | Attending: Urology | Admitting: Urology

## 2015-06-10 DIAGNOSIS — Z8546 Personal history of malignant neoplasm of prostate: Secondary | ICD-10-CM | POA: Diagnosis not present

## 2015-06-10 DIAGNOSIS — N200 Calculus of kidney: Secondary | ICD-10-CM | POA: Diagnosis not present

## 2015-06-10 DIAGNOSIS — Z87442 Personal history of urinary calculi: Secondary | ICD-10-CM | POA: Insufficient documentation

## 2015-06-10 DIAGNOSIS — N201 Calculus of ureter: Secondary | ICD-10-CM | POA: Diagnosis present

## 2015-06-10 DIAGNOSIS — Z951 Presence of aortocoronary bypass graft: Secondary | ICD-10-CM | POA: Insufficient documentation

## 2015-06-10 DIAGNOSIS — Z955 Presence of coronary angioplasty implant and graft: Secondary | ICD-10-CM | POA: Diagnosis not present

## 2015-06-10 DIAGNOSIS — I2510 Atherosclerotic heart disease of native coronary artery without angina pectoris: Secondary | ICD-10-CM | POA: Insufficient documentation

## 2015-06-10 DIAGNOSIS — Z923 Personal history of irradiation: Secondary | ICD-10-CM | POA: Diagnosis not present

## 2015-06-10 DIAGNOSIS — I255 Ischemic cardiomyopathy: Secondary | ICD-10-CM | POA: Insufficient documentation

## 2015-06-10 DIAGNOSIS — Z8673 Personal history of transient ischemic attack (TIA), and cerebral infarction without residual deficits: Secondary | ICD-10-CM | POA: Insufficient documentation

## 2015-06-10 LAB — PROTIME-INR
INR: 0.99 (ref 0.00–1.49)
Prothrombin Time: 13.3 seconds (ref 11.6–15.2)

## 2015-06-10 SURGERY — LITHOTRIPSY, ESWL
Anesthesia: LOCAL | Laterality: Right

## 2015-06-10 MED ORDER — ONDANSETRON HCL 4 MG PO TABS: 4.0000 mg | ORAL_TABLET | Freq: Three times a day (TID) | ORAL | Status: DC | PRN

## 2015-06-10 MED ORDER — TRAMADOL HCL 50 MG PO TABS: 50.0000 mg | ORAL_TABLET | Freq: Four times a day (QID) | ORAL | Status: DC | PRN

## 2015-06-10 MED ORDER — DIAZEPAM 5 MG PO TABS
10.0000 mg | ORAL_TABLET | ORAL | Status: AC
  Administered 2015-06-10: 10 mg via ORAL
  Filled 2015-06-10: qty 2

## 2015-06-10 MED ORDER — LEVOFLOXACIN 500 MG PO TABS
500.0000 mg | ORAL_TABLET | ORAL | Status: AC
Start: 2015-06-10 — End: 2015-06-10
  Administered 2015-06-10: 500 mg via ORAL
  Filled 2015-06-10: qty 1

## 2015-06-10 MED ORDER — SODIUM CHLORIDE 0.9 % IV SOLN
INTRAVENOUS | Status: DC
  Administered 2015-06-10: 10:00:00 via INTRAVENOUS

## 2015-06-10 MED ORDER — DIPHENHYDRAMINE HCL 25 MG PO CAPS
25.0000 mg | ORAL_CAPSULE | ORAL | Status: AC
  Administered 2015-06-10: 25 mg via ORAL
  Filled 2015-06-10: qty 1

## 2015-06-10 MED ORDER — SENNOSIDES-DOCUSATE SODIUM 8.6-50 MG PO TABS: 1.0000 | ORAL_TABLET | Freq: Two times a day (BID) | ORAL | Status: DC

## 2015-06-10 NOTE — H&P (Addendum)
Donald [name] is an 82 y.o. male.    Chief Complaint: Pre-op Right Shockwave Lithotripsy  HPI:   1 - Right UPJ Stone  - 7mm Rt UPJ sotne by CT 2017 on eval hematuria. Several ipsilateral lower pole stones as well. UPJ stone is 14mm, 700HU, SSD 11cm and associated with intermittent hematuria.  Today Donald [name] is seen to proceed with right shockwave lithotripsy. He has been off coumadin as instructed.   Past Medical History  Diagnosis Date  . Post-infarction apical thrombus ([place])   . CAD (coronary artery disease)   . Ischemic cardiomyopathy   . CVA (cerebral infarction) 2012    tia, mild no residual defecits  . Asthma     with worsening with beta blockade  . GI bleed 8 yrs ago    due to doll fully vessel which was clipped  . Glaucoma     excellent cataracts  . Nephrolithiasis   . Prostate cancer ([place]) 2012  . Tubular adenoma 04/2013    Dr. Hilarie Fredrickson  . Radiation proctitis Feb 2015    treated with APC    Past Surgical History  Procedure Laterality Date  . Cardiac stents  2003  . Coronary artery bypass graft  1998    LIMA to the LAD and saphenous vein graft tothe diagonal  . History of radiation treatment  2012    x 40 treatments done  . Colonoscopy with propofol N/A 05/05/2013    Procedure: COLONOSCOPY WITH PROPOFOL;  Surgeon: Jerene Bears, MD;  Location: [facility];  Service: Gastroenterology;  Laterality: N/A;    Family History  Problem Relation Age of Onset  . Heart attack Father   . Heart disease Mother   . Stomach cancer Mother    Social History:  reports that he has never smoked. He has never used smokeless tobacco. He reports that he drinks alcohol. He reports that he does not use illicit drugs.  Allergies:  Allergies  Allergen Reactions  . Keflex [Cephalexin] Other (See Comments)    Nausea, vomiting killed all GI enzymes, would not like to take  . Morphine Nausea Only    No prescriptions prior to admission    No results found for this or any previous visit  (from the past 48 hour(s)). No results found.  Review of Systems  Constitutional: Negative.   HENT: Negative.   Eyes: Negative.   Respiratory: Negative.   Cardiovascular: Negative.   Gastrointestinal: Negative.   Genitourinary: Positive for hematuria. Negative for flank pain.  Musculoskeletal: Negative.   Skin: Negative.   Neurological: Negative.   Endo/Heme/Allergies: Negative.   Psychiatric/Behavioral: Negative.     Height 5\' 10"  (1.778 m), weight 81.647 kg (180 lb). Physical Exam  Constitutional: He appears well-developed.  HENT:  Head: Normocephalic.  Eyes: Pupils are equal, round, and reactive to light.  Neck: Normal range of motion.  Cardiovascular: Normal rate.   Respiratory: Effort normal.  GI: Soft.  Genitourinary:  No CVAT  Musculoskeletal: Normal range of motion.  Skin: Skin is warm.  Psychiatric: He has a normal mood and affect. His behavior is normal. Judgment and thought content normal.     Assessment/Plan  1 - Right UPJ Stone  -  We discussed shockwave lithotripsy in detail as well as my "rule of 9s" with stones <36mm, less than 900 HU, and skin to stone distance <9cm having approximately 90% treatment success with single session of treatment. We then addressed how stones that are larger, more dense, and in patients with  less favorable anatomy have incrementally decreased success rates. We discussed risks including, bleeding, infection, hematoma, loss of kidney, need for staged therapy, need for adjunctive therapy and requirement to refrain from any anticoagulants, anti-platelet or aspirin-like products peri-procedureally.   After careful consideration, the patient has chosen to proceed.   Alexis Frock, MD 06/10/2015, 6:13 AM   Most recent UA without infectious parameters.

## 2015-06-10 NOTE — Discharge Instructions (Signed)
1 - You may have urinary urgency (bladder spasms), bloody urine on / off, and pass stone material for few weeks. This is normal.  2 - Call MD or go to ER for fever >102, severe pain / nausea / vomiting not relieved by medications, or acute change in medical status  3 - Resume Warfarin on Thursday.

## 2015-06-11 ENCOUNTER — Telehealth: Payer: Self-pay | Admitting: Pharmacist Clinician (PhC)/ Clinical Pharmacy Specialist

## 2015-06-11 NOTE — Telephone Encounter (Signed)
Patient called Monday afternoon after lithotripsy procedure.  States was told by Dr. Tresa Moore not to restart warfarin or lovenox until Thursday.  Patient voiced concern because of previous CVA.  Reviewed with Dr. [name] and contacted Dr. Zettie Pho office Tuesday AM.  [facility] for his nurse, asking if we can re-start sooner due to history of stroke.

## 2015-06-11 NOTE — Telephone Encounter (Signed)
Received return VM from [facility] at Dr. Zettie Pho office.  He is concerned about potential bleeding in kidney after lithotripsy and would prefer to hold off all anticoagulation until Thursday.  Reviewed with Dr. [name] and decided to restart warfarin today at 7.5 mg x 3 days then resume 5 mg daily, as it will take several days to reach therapeutic levels.  Will have patient start injections on Thursday.  When spoke with patient he had taken one lovenox injection this am, but will hold off on further until Thursday.  Appt set for repeat INR Monday

## 2015-06-15 DIAGNOSIS — M25561 Pain in right knee: Secondary | ICD-10-CM | POA: Diagnosis not present

## 2015-06-17 ENCOUNTER — Ambulatory Visit (INDEPENDENT_AMBULATORY_CARE_PROVIDER_SITE_OTHER): Payer: Medicare Other | Admitting: Pharmacist Clinician (PhC)/ Clinical Pharmacy Specialist

## 2015-06-17 DIAGNOSIS — I4891 Unspecified atrial fibrillation: Secondary | ICD-10-CM

## 2015-06-17 DIAGNOSIS — G459 Transient cerebral ischemic attack, unspecified: Secondary | ICD-10-CM

## 2015-06-17 DIAGNOSIS — Z5181 Encounter for therapeutic drug level monitoring: Secondary | ICD-10-CM | POA: Diagnosis not present

## 2015-06-17 DIAGNOSIS — I6350 Cerebral infarction due to unspecified occlusion or stenosis of unspecified cerebral artery: Secondary | ICD-10-CM | POA: Diagnosis not present

## 2015-06-17 DIAGNOSIS — I480 Paroxysmal atrial fibrillation: Secondary | ICD-10-CM

## 2015-06-17 LAB — POCT INR: INR: 2.7

## 2015-06-25 DIAGNOSIS — G8929 Other chronic pain: Secondary | ICD-10-CM | POA: Diagnosis not present

## 2015-06-25 DIAGNOSIS — M25561 Pain in right knee: Secondary | ICD-10-CM | POA: Diagnosis not present

## 2015-07-01 DIAGNOSIS — S83281A Other tear of lateral meniscus, current injury, right knee, initial encounter: Secondary | ICD-10-CM | POA: Diagnosis not present

## 2015-07-01 DIAGNOSIS — S83231A Complex tear of medial meniscus, current injury, right knee, initial encounter: Secondary | ICD-10-CM | POA: Diagnosis not present

## 2015-07-02 ENCOUNTER — Telehealth: Payer: Self-pay | Admitting: *Deleted

## 2015-07-02 NOTE — Telephone Encounter (Signed)
Pt needs clearance for right knee: KA- medial or lateral menisectomy. They are also requesting instructions regarding stopping the pts warfarin for the procedure. Will forward for dr [name] review

## 2015-07-02 NOTE — Telephone Encounter (Signed)
DC coumadin 5 days prior to procedure; needs lovenox bridge. Kirk Ruths

## 2015-07-03 NOTE — Telephone Encounter (Signed)
Will forward this note to [facility] md. Pt has a CVRR appt 07-08-15.

## 2015-07-04 ENCOUNTER — Telehealth: Payer: Self-pay | Admitting: Pharmacist Clinician (PhC)/ Clinical Pharmacy Specialist

## 2015-07-04 MED ORDER — ENOXAPARIN SODIUM 80 MG/0.8ML ~~LOC~~ SOLN: SUBCUTANEOUS | Status: DC

## 2015-07-04 NOTE — Telephone Encounter (Signed)
Enoxaprin Dosing Schedule  Enoxparin dose: 80 mg  Date  Warfarin Dose (evenings) Enoxaprin Dose  4-6 6 Normal dose   4-7 5 0   4-8 4 0 8 am   8 pm  4-9 3 0 8 am   8 pm  4-10 2 0 8 am   8 pm  4-11 1 0   4-12 Procedure 0   4-13 1 1.5 tabs (7.5 mg) 8 am   8 pm  4-14 2 1.5 tabs (7.5 mg) 8 am   8 pm  4-15 3 1.5 tabs (7.5 mg) 8 am   8 pm  4-16 4 Normal dose 8 am   8 pm  4-17 5 Normal dose 8 am   8 pm  4-18 6 Repeat INR     Spoke with patient on phone.  He still has bridge information from procedure last month.  He just changed the dates and will follow.  Same as above.  Rx sent to [facility] on [address].

## 2015-07-05 ENCOUNTER — Telehealth: Payer: Self-pay | Admitting: Cardiology

## 2015-07-05 DIAGNOSIS — Z0000 Encounter for general adult medical examination without abnormal findings: Secondary | ICD-10-CM | POA: Diagnosis not present

## 2015-07-05 DIAGNOSIS — N200 Calculus of kidney: Secondary | ICD-10-CM | POA: Diagnosis not present

## 2015-07-05 NOTE — Telephone Encounter (Signed)
Donald [name] was calling in to report a medication approval for Emoxaparin Sodium 80mg /32mL [place]. She says notices will be mailed out to both the provider and patient.   Thanks

## 2015-07-05 NOTE — Telephone Encounter (Signed)
Ran PA thru Cover My Meds and got approval. Confirmed with pharmacy and let patient know.

## 2015-07-08 ENCOUNTER — Encounter: Payer: Medicare Other | Admitting: Pharmacist Clinician (PhC)/ Clinical Pharmacy Specialist

## 2015-07-10 DIAGNOSIS — Y929 Unspecified place or not applicable: Secondary | ICD-10-CM | POA: Diagnosis not present

## 2015-07-10 DIAGNOSIS — S83231A Complex tear of medial meniscus, current injury, right knee, initial encounter: Secondary | ICD-10-CM | POA: Diagnosis not present

## 2015-07-10 DIAGNOSIS — S83241A Other tear of medial meniscus, current injury, right knee, initial encounter: Secondary | ICD-10-CM | POA: Diagnosis not present

## 2015-07-10 DIAGNOSIS — M179 Osteoarthritis of knee, unspecified: Secondary | ICD-10-CM | POA: Diagnosis not present

## 2015-07-10 DIAGNOSIS — Y939 Activity, unspecified: Secondary | ICD-10-CM | POA: Diagnosis not present

## 2015-07-10 DIAGNOSIS — S83281A Other tear of lateral meniscus, current injury, right knee, initial encounter: Secondary | ICD-10-CM | POA: Diagnosis not present

## 2015-07-10 DIAGNOSIS — X58XXXA Exposure to other specified factors, initial encounter: Secondary | ICD-10-CM | POA: Diagnosis not present

## 2015-07-10 DIAGNOSIS — Y999 Unspecified external cause status: Secondary | ICD-10-CM | POA: Diagnosis not present

## 2015-07-10 DIAGNOSIS — S83271A Complex tear of lateral meniscus, current injury, right knee, initial encounter: Secondary | ICD-10-CM | POA: Diagnosis not present

## 2015-07-10 DIAGNOSIS — M659 Synovitis and tenosynovitis, unspecified: Secondary | ICD-10-CM | POA: Diagnosis not present

## 2015-07-15 ENCOUNTER — Ambulatory Visit (INDEPENDENT_AMBULATORY_CARE_PROVIDER_SITE_OTHER): Payer: Medicare Other | Admitting: Pharmacist Clinician (PhC)/ Clinical Pharmacy Specialist

## 2015-07-15 DIAGNOSIS — Z5181 Encounter for therapeutic drug level monitoring: Secondary | ICD-10-CM

## 2015-07-15 DIAGNOSIS — I4891 Unspecified atrial fibrillation: Secondary | ICD-10-CM | POA: Diagnosis not present

## 2015-07-15 DIAGNOSIS — G459 Transient cerebral ischemic attack, unspecified: Secondary | ICD-10-CM

## 2015-07-15 DIAGNOSIS — I480 Paroxysmal atrial fibrillation: Secondary | ICD-10-CM

## 2015-07-15 DIAGNOSIS — I6350 Cerebral infarction due to unspecified occlusion or stenosis of unspecified cerebral artery: Secondary | ICD-10-CM

## 2015-07-15 LAB — POCT INR: INR: 1.5

## 2015-07-16 ENCOUNTER — Encounter: Payer: Medicare Other | Admitting: Pharmacist Clinician (PhC)/ Clinical Pharmacy Specialist

## 2015-07-22 DIAGNOSIS — S83231D Complex tear of medial meniscus, current injury, right knee, subsequent encounter: Secondary | ICD-10-CM | POA: Diagnosis not present

## 2015-07-22 DIAGNOSIS — S83281D Other tear of lateral meniscus, current injury, right knee, subsequent encounter: Secondary | ICD-10-CM | POA: Diagnosis not present

## 2015-07-22 DIAGNOSIS — Z4789 Encounter for other orthopedic aftercare: Secondary | ICD-10-CM | POA: Diagnosis not present

## 2015-07-29 ENCOUNTER — Ambulatory Visit (INDEPENDENT_AMBULATORY_CARE_PROVIDER_SITE_OTHER): Payer: Medicare Other | Admitting: Pharmacist

## 2015-07-29 DIAGNOSIS — G459 Transient cerebral ischemic attack, unspecified: Secondary | ICD-10-CM | POA: Diagnosis not present

## 2015-07-29 DIAGNOSIS — I4891 Unspecified atrial fibrillation: Secondary | ICD-10-CM

## 2015-07-29 DIAGNOSIS — I6350 Cerebral infarction due to unspecified occlusion or stenosis of unspecified cerebral artery: Secondary | ICD-10-CM

## 2015-07-29 DIAGNOSIS — Z5181 Encounter for therapeutic drug level monitoring: Secondary | ICD-10-CM

## 2015-07-29 DIAGNOSIS — I480 Paroxysmal atrial fibrillation: Secondary | ICD-10-CM | POA: Diagnosis not present

## 2015-07-29 LAB — POCT INR: INR: 3.8

## 2015-07-30 DIAGNOSIS — Z4789 Encounter for other orthopedic aftercare: Secondary | ICD-10-CM | POA: Diagnosis not present

## 2015-07-30 DIAGNOSIS — S83281D Other tear of lateral meniscus, current injury, right knee, subsequent encounter: Secondary | ICD-10-CM | POA: Diagnosis not present

## 2015-07-31 DIAGNOSIS — M1711 Unilateral primary osteoarthritis, right knee: Secondary | ICD-10-CM | POA: Diagnosis not present

## 2015-08-06 DIAGNOSIS — M1711 Unilateral primary osteoarthritis, right knee: Secondary | ICD-10-CM | POA: Diagnosis not present

## 2015-08-09 DIAGNOSIS — M1711 Unilateral primary osteoarthritis, right knee: Secondary | ICD-10-CM | POA: Diagnosis not present

## 2015-08-12 ENCOUNTER — Ambulatory Visit (INDEPENDENT_AMBULATORY_CARE_PROVIDER_SITE_OTHER): Payer: Medicare Other | Admitting: Pharmacist

## 2015-08-12 DIAGNOSIS — Z5181 Encounter for therapeutic drug level monitoring: Secondary | ICD-10-CM

## 2015-08-12 DIAGNOSIS — G459 Transient cerebral ischemic attack, unspecified: Secondary | ICD-10-CM | POA: Diagnosis not present

## 2015-08-12 DIAGNOSIS — I4891 Unspecified atrial fibrillation: Secondary | ICD-10-CM

## 2015-08-12 DIAGNOSIS — I480 Paroxysmal atrial fibrillation: Secondary | ICD-10-CM | POA: Diagnosis not present

## 2015-08-12 DIAGNOSIS — I6350 Cerebral infarction due to unspecified occlusion or stenosis of unspecified cerebral artery: Secondary | ICD-10-CM | POA: Diagnosis not present

## 2015-08-12 LAB — POCT INR: INR: 2.7

## 2015-08-13 DIAGNOSIS — M1711 Unilateral primary osteoarthritis, right knee: Secondary | ICD-10-CM | POA: Diagnosis not present

## 2015-08-15 DIAGNOSIS — L259 Unspecified contact dermatitis, unspecified cause: Secondary | ICD-10-CM | POA: Diagnosis not present

## 2015-08-15 DIAGNOSIS — L820 Inflamed seborrheic keratosis: Secondary | ICD-10-CM | POA: Diagnosis not present

## 2015-08-15 DIAGNOSIS — L570 Actinic keratosis: Secondary | ICD-10-CM | POA: Diagnosis not present

## 2015-08-16 DIAGNOSIS — M1711 Unilateral primary osteoarthritis, right knee: Secondary | ICD-10-CM | POA: Diagnosis not present

## 2015-08-19 DIAGNOSIS — M1711 Unilateral primary osteoarthritis, right knee: Secondary | ICD-10-CM | POA: Diagnosis not present

## 2015-08-19 DIAGNOSIS — S83231D Complex tear of medial meniscus, current injury, right knee, subsequent encounter: Secondary | ICD-10-CM | POA: Diagnosis not present

## 2015-08-23 DIAGNOSIS — M1711 Unilateral primary osteoarthritis, right knee: Secondary | ICD-10-CM | POA: Diagnosis not present

## 2015-08-27 DIAGNOSIS — M1711 Unilateral primary osteoarthritis, right knee: Secondary | ICD-10-CM | POA: Diagnosis not present

## 2015-08-30 DIAGNOSIS — M1711 Unilateral primary osteoarthritis, right knee: Secondary | ICD-10-CM | POA: Diagnosis not present

## 2015-09-03 DIAGNOSIS — M1711 Unilateral primary osteoarthritis, right knee: Secondary | ICD-10-CM | POA: Diagnosis not present

## 2015-09-05 DIAGNOSIS — H04123 Dry eye syndrome of bilateral lacrimal glands: Secondary | ICD-10-CM | POA: Diagnosis not present

## 2015-09-05 DIAGNOSIS — Z961 Presence of intraocular lens: Secondary | ICD-10-CM | POA: Diagnosis not present

## 2015-09-05 DIAGNOSIS — H4032X3 Glaucoma secondary to eye trauma, left eye, severe stage: Secondary | ICD-10-CM | POA: Diagnosis not present

## 2015-09-05 DIAGNOSIS — H40051 Ocular hypertension, right eye: Secondary | ICD-10-CM | POA: Diagnosis not present

## 2015-09-06 DIAGNOSIS — M1711 Unilateral primary osteoarthritis, right knee: Secondary | ICD-10-CM | POA: Diagnosis not present

## 2015-09-09 ENCOUNTER — Ambulatory Visit (INDEPENDENT_AMBULATORY_CARE_PROVIDER_SITE_OTHER): Payer: Medicare Other | Admitting: Pharmacist

## 2015-09-09 DIAGNOSIS — G459 Transient cerebral ischemic attack, unspecified: Secondary | ICD-10-CM

## 2015-09-09 DIAGNOSIS — Z5181 Encounter for therapeutic drug level monitoring: Secondary | ICD-10-CM

## 2015-09-09 DIAGNOSIS — I480 Paroxysmal atrial fibrillation: Secondary | ICD-10-CM

## 2015-09-09 DIAGNOSIS — I6350 Cerebral infarction due to unspecified occlusion or stenosis of unspecified cerebral artery: Secondary | ICD-10-CM | POA: Diagnosis not present

## 2015-09-09 DIAGNOSIS — I4891 Unspecified atrial fibrillation: Secondary | ICD-10-CM | POA: Diagnosis not present

## 2015-09-09 LAB — POCT INR: INR: 3.5

## 2015-09-10 DIAGNOSIS — M1711 Unilateral primary osteoarthritis, right knee: Secondary | ICD-10-CM | POA: Diagnosis not present

## 2015-09-16 DIAGNOSIS — S83231D Complex tear of medial meniscus, current injury, right knee, subsequent encounter: Secondary | ICD-10-CM | POA: Diagnosis not present

## 2015-09-16 DIAGNOSIS — M1711 Unilateral primary osteoarthritis, right knee: Secondary | ICD-10-CM | POA: Diagnosis not present

## 2015-09-16 DIAGNOSIS — Z4789 Encounter for other orthopedic aftercare: Secondary | ICD-10-CM | POA: Diagnosis not present

## 2015-09-26 DIAGNOSIS — M5417 Radiculopathy, lumbosacral region: Secondary | ICD-10-CM | POA: Diagnosis not present

## 2015-09-26 DIAGNOSIS — M5127 Other intervertebral disc displacement, lumbosacral region: Secondary | ICD-10-CM | POA: Diagnosis not present

## 2015-09-26 DIAGNOSIS — M9904 Segmental and somatic dysfunction of sacral region: Secondary | ICD-10-CM | POA: Diagnosis not present

## 2015-09-26 DIAGNOSIS — M9903 Segmental and somatic dysfunction of lumbar region: Secondary | ICD-10-CM | POA: Diagnosis not present

## 2015-09-26 DIAGNOSIS — M25552 Pain in left hip: Secondary | ICD-10-CM | POA: Diagnosis not present

## 2015-09-26 DIAGNOSIS — M25551 Pain in right hip: Secondary | ICD-10-CM | POA: Diagnosis not present

## 2015-09-26 DIAGNOSIS — M9905 Segmental and somatic dysfunction of pelvic region: Secondary | ICD-10-CM | POA: Diagnosis not present

## 2015-09-30 ENCOUNTER — Ambulatory Visit (INDEPENDENT_AMBULATORY_CARE_PROVIDER_SITE_OTHER): Payer: Medicare Other | Admitting: Pharmacist Clinician (PhC)/ Clinical Pharmacy Specialist

## 2015-09-30 DIAGNOSIS — I480 Paroxysmal atrial fibrillation: Secondary | ICD-10-CM | POA: Diagnosis not present

## 2015-09-30 DIAGNOSIS — G459 Transient cerebral ischemic attack, unspecified: Secondary | ICD-10-CM | POA: Diagnosis not present

## 2015-09-30 DIAGNOSIS — I4891 Unspecified atrial fibrillation: Secondary | ICD-10-CM | POA: Diagnosis not present

## 2015-09-30 DIAGNOSIS — I6350 Cerebral infarction due to unspecified occlusion or stenosis of unspecified cerebral artery: Secondary | ICD-10-CM

## 2015-09-30 DIAGNOSIS — Z5181 Encounter for therapeutic drug level monitoring: Secondary | ICD-10-CM | POA: Diagnosis not present

## 2015-09-30 DIAGNOSIS — S83241D Other tear of medial meniscus, current injury, right knee, subsequent encounter: Secondary | ICD-10-CM | POA: Diagnosis not present

## 2015-09-30 DIAGNOSIS — M1711 Unilateral primary osteoarthritis, right knee: Secondary | ICD-10-CM | POA: Diagnosis not present

## 2015-09-30 DIAGNOSIS — S83281D Other tear of lateral meniscus, current injury, right knee, subsequent encounter: Secondary | ICD-10-CM | POA: Diagnosis not present

## 2015-09-30 LAB — POCT INR: INR: 2.4

## 2015-10-08 DIAGNOSIS — R7301 Impaired fasting glucose: Secondary | ICD-10-CM | POA: Diagnosis not present

## 2015-10-08 DIAGNOSIS — Z125 Encounter for screening for malignant neoplasm of prostate: Secondary | ICD-10-CM | POA: Diagnosis not present

## 2015-10-08 DIAGNOSIS — I509 Heart failure, unspecified: Secondary | ICD-10-CM | POA: Diagnosis not present

## 2015-10-08 DIAGNOSIS — M859 Disorder of bone density and structure, unspecified: Secondary | ICD-10-CM | POA: Diagnosis not present

## 2015-10-11 DIAGNOSIS — M1711 Unilateral primary osteoarthritis, right knee: Secondary | ICD-10-CM | POA: Diagnosis not present

## 2015-10-15 DIAGNOSIS — Z1389 Encounter for screening for other disorder: Secondary | ICD-10-CM | POA: Diagnosis not present

## 2015-10-15 DIAGNOSIS — N320 Bladder-neck obstruction: Secondary | ICD-10-CM | POA: Diagnosis not present

## 2015-10-15 DIAGNOSIS — I951 Orthostatic hypotension: Secondary | ICD-10-CM | POA: Diagnosis not present

## 2015-10-15 DIAGNOSIS — I110 Hypertensive heart disease with heart failure: Secondary | ICD-10-CM | POA: Diagnosis not present

## 2015-10-15 DIAGNOSIS — M859 Disorder of bone density and structure, unspecified: Secondary | ICD-10-CM | POA: Diagnosis not present

## 2015-10-15 DIAGNOSIS — M5430 Sciatica, unspecified side: Secondary | ICD-10-CM | POA: Diagnosis not present

## 2015-10-15 DIAGNOSIS — Z6826 Body mass index (BMI) 26.0-26.9, adult: Secondary | ICD-10-CM | POA: Diagnosis not present

## 2015-10-15 DIAGNOSIS — D692 Other nonthrombocytopenic purpura: Secondary | ICD-10-CM | POA: Diagnosis not present

## 2015-10-15 DIAGNOSIS — M1711 Unilateral primary osteoarthritis, right knee: Secondary | ICD-10-CM | POA: Diagnosis not present

## 2015-10-15 DIAGNOSIS — H409 Unspecified glaucoma: Secondary | ICD-10-CM | POA: Diagnosis not present

## 2015-10-15 DIAGNOSIS — R7301 Impaired fasting glucose: Secondary | ICD-10-CM | POA: Diagnosis not present

## 2015-10-15 DIAGNOSIS — R29890 Loss of height: Secondary | ICD-10-CM | POA: Diagnosis not present

## 2015-10-15 DIAGNOSIS — E441 Mild protein-calorie malnutrition: Secondary | ICD-10-CM | POA: Diagnosis not present

## 2015-10-15 DIAGNOSIS — Z0000 Encounter for general adult medical examination without abnormal findings: Secondary | ICD-10-CM | POA: Diagnosis not present

## 2015-10-18 DIAGNOSIS — M1711 Unilateral primary osteoarthritis, right knee: Secondary | ICD-10-CM | POA: Diagnosis not present

## 2015-10-24 DIAGNOSIS — M25552 Pain in left hip: Secondary | ICD-10-CM | POA: Diagnosis not present

## 2015-10-24 DIAGNOSIS — M25551 Pain in right hip: Secondary | ICD-10-CM | POA: Diagnosis not present

## 2015-10-24 DIAGNOSIS — M9903 Segmental and somatic dysfunction of lumbar region: Secondary | ICD-10-CM | POA: Diagnosis not present

## 2015-10-24 DIAGNOSIS — M9904 Segmental and somatic dysfunction of sacral region: Secondary | ICD-10-CM | POA: Diagnosis not present

## 2015-10-24 DIAGNOSIS — M9905 Segmental and somatic dysfunction of pelvic region: Secondary | ICD-10-CM | POA: Diagnosis not present

## 2015-10-24 DIAGNOSIS — M5417 Radiculopathy, lumbosacral region: Secondary | ICD-10-CM | POA: Diagnosis not present

## 2015-10-24 DIAGNOSIS — M5127 Other intervertebral disc displacement, lumbosacral region: Secondary | ICD-10-CM | POA: Diagnosis not present

## 2015-10-25 DIAGNOSIS — M1711 Unilateral primary osteoarthritis, right knee: Secondary | ICD-10-CM | POA: Diagnosis not present

## 2015-10-29 DIAGNOSIS — M1711 Unilateral primary osteoarthritis, right knee: Secondary | ICD-10-CM | POA: Diagnosis not present

## 2015-10-30 ENCOUNTER — Ambulatory Visit (INDEPENDENT_AMBULATORY_CARE_PROVIDER_SITE_OTHER): Payer: Medicare Other | Admitting: Pharmacist Clinician (PhC)/ Clinical Pharmacy Specialist

## 2015-10-30 DIAGNOSIS — G459 Transient cerebral ischemic attack, unspecified: Secondary | ICD-10-CM | POA: Diagnosis not present

## 2015-10-30 DIAGNOSIS — I480 Paroxysmal atrial fibrillation: Secondary | ICD-10-CM | POA: Diagnosis not present

## 2015-10-30 DIAGNOSIS — Z5181 Encounter for therapeutic drug level monitoring: Secondary | ICD-10-CM

## 2015-10-30 DIAGNOSIS — I6350 Cerebral infarction due to unspecified occlusion or stenosis of unspecified cerebral artery: Secondary | ICD-10-CM

## 2015-10-30 DIAGNOSIS — I4891 Unspecified atrial fibrillation: Secondary | ICD-10-CM

## 2015-10-30 LAB — POCT INR: INR: 2.5

## 2015-11-01 DIAGNOSIS — M1711 Unilateral primary osteoarthritis, right knee: Secondary | ICD-10-CM | POA: Diagnosis not present

## 2015-12-11 DIAGNOSIS — R351 Nocturia: Secondary | ICD-10-CM | POA: Diagnosis not present

## 2015-12-11 DIAGNOSIS — N200 Calculus of kidney: Secondary | ICD-10-CM | POA: Diagnosis not present

## 2015-12-11 DIAGNOSIS — N401 Enlarged prostate with lower urinary tract symptoms: Secondary | ICD-10-CM | POA: Diagnosis not present

## 2015-12-11 DIAGNOSIS — Z8546 Personal history of malignant neoplasm of prostate: Secondary | ICD-10-CM | POA: Diagnosis not present

## 2015-12-12 ENCOUNTER — Ambulatory Visit (INDEPENDENT_AMBULATORY_CARE_PROVIDER_SITE_OTHER): Payer: Medicare Other | Admitting: Pharmacist Clinician (PhC)/ Clinical Pharmacy Specialist

## 2015-12-12 DIAGNOSIS — G459 Transient cerebral ischemic attack, unspecified: Secondary | ICD-10-CM

## 2015-12-12 DIAGNOSIS — I4891 Unspecified atrial fibrillation: Secondary | ICD-10-CM

## 2015-12-12 DIAGNOSIS — M25561 Pain in right knee: Secondary | ICD-10-CM | POA: Diagnosis not present

## 2015-12-12 DIAGNOSIS — I6350 Cerebral infarction due to unspecified occlusion or stenosis of unspecified cerebral artery: Secondary | ICD-10-CM

## 2015-12-12 DIAGNOSIS — M1711 Unilateral primary osteoarthritis, right knee: Secondary | ICD-10-CM | POA: Diagnosis not present

## 2015-12-12 DIAGNOSIS — I480 Paroxysmal atrial fibrillation: Secondary | ICD-10-CM

## 2015-12-12 DIAGNOSIS — Z5181 Encounter for therapeutic drug level monitoring: Secondary | ICD-10-CM

## 2015-12-12 LAB — POCT INR: INR: 2.8

## 2015-12-16 ENCOUNTER — Other Ambulatory Visit: Payer: Self-pay | Admitting: Pharmacist Clinician (PhC)/ Clinical Pharmacy Specialist

## 2015-12-16 MED ORDER — WARFARIN SODIUM 5 MG PO TABS: 5.0000 mg | ORAL_TABLET | Freq: Every day | ORAL | 1 refills | Status: DC

## 2016-01-02 DIAGNOSIS — M5417 Radiculopathy, lumbosacral region: Secondary | ICD-10-CM | POA: Diagnosis not present

## 2016-01-02 DIAGNOSIS — M9905 Segmental and somatic dysfunction of pelvic region: Secondary | ICD-10-CM | POA: Diagnosis not present

## 2016-01-02 DIAGNOSIS — M9903 Segmental and somatic dysfunction of lumbar region: Secondary | ICD-10-CM | POA: Diagnosis not present

## 2016-01-02 DIAGNOSIS — M9904 Segmental and somatic dysfunction of sacral region: Secondary | ICD-10-CM | POA: Diagnosis not present

## 2016-01-02 DIAGNOSIS — M5127 Other intervertebral disc displacement, lumbosacral region: Secondary | ICD-10-CM | POA: Diagnosis not present

## 2016-01-02 DIAGNOSIS — M25551 Pain in right hip: Secondary | ICD-10-CM | POA: Diagnosis not present

## 2016-01-02 DIAGNOSIS — M25552 Pain in left hip: Secondary | ICD-10-CM | POA: Diagnosis not present

## 2016-01-21 ENCOUNTER — Encounter: Payer: Self-pay | Admitting: Cardiology

## 2016-01-22 ENCOUNTER — Ambulatory Visit (INDEPENDENT_AMBULATORY_CARE_PROVIDER_SITE_OTHER): Payer: Medicare Other | Admitting: Pharmacist

## 2016-01-22 DIAGNOSIS — I4891 Unspecified atrial fibrillation: Secondary | ICD-10-CM | POA: Diagnosis not present

## 2016-01-22 DIAGNOSIS — I6350 Cerebral infarction due to unspecified occlusion or stenosis of unspecified cerebral artery: Secondary | ICD-10-CM

## 2016-01-22 DIAGNOSIS — I480 Paroxysmal atrial fibrillation: Secondary | ICD-10-CM

## 2016-01-22 DIAGNOSIS — G459 Transient cerebral ischemic attack, unspecified: Secondary | ICD-10-CM | POA: Diagnosis not present

## 2016-01-22 DIAGNOSIS — Z5181 Encounter for therapeutic drug level monitoring: Secondary | ICD-10-CM

## 2016-01-22 LAB — POCT INR: INR: 2.8

## 2016-01-23 DIAGNOSIS — M1711 Unilateral primary osteoarthritis, right knee: Secondary | ICD-10-CM | POA: Diagnosis not present

## 2016-01-29 NOTE — Progress Notes (Signed)
HPI: FU CAD. He is status post coronary bypassing graft in 1990; also with h/o ischemic cardiomyopathy, prior stroke and prior mural apical thrombus on chronic coumadin Rx. Prior abdominal ultrasound in May 2006 showed no aneurysm. Carotid Dopplers in November of 2005 showed 0-39% bilaterally. MRA in October of 2007 showed no obstructive disease. LHC 09/24/11: LM 30, oLAD 100%, dLAD fills via RV branch off RCA, IM diffuse 95% subtotal disease, mCFX 40%, OM1 normal, RCA normal with large RV branch supplying dLAD, PLA possibly occluded and most dRCA appears to fill from LIMA collats; LIMA patent but does not supply dLAD (connects via small vessel to ? DRCA/PLA, S-IM occluded). Circulation felt to be stable. Continue med Rx recommended. Echocardiogram repeated August 2015. There was a small layered apical thrombus. Ejection fraction 50-55%. Grade 1 diastolic dysfunction. Trace aortic insufficiency and mitral regurgitation. Mild left atrial enlargement. Moderately elevated pulmonary pressures. Since last seen, the patient has dyspnea with more extreme activities but not with routine activities. It is relieved with rest. It is not associated with chest pain. There is no orthopnea, PND or pedal edema. There is no syncope or palpitations. There is no exertional chest pain.   Current Outpatient Prescriptions  Medication Sig Dispense Refill  . calcium-vitamin D 250-100 MG-UNIT per tablet Take 1 tablet by mouth daily.     . Cholecalciferol (VITAMIN D) 2000 UNITS tablet Take 2,000 Units by mouth daily.    Marland Kitchen enoxaparin (LOVENOX) 80 MG/0.8ML injection Inject 80 mg (1 syringe) subcutaneously every 12 hours as directed 14 Syringe 0  . fenofibrate micronized (LOFIBRA) 200 MG capsule TAKE 1 BY MOUTH DAILY 90 capsule 3  . Fluticasone Furoate-Vilanterol (BREO ELLIPTA) 100-25 MCG/INH AEPB Inhale into the lungs every morning.    . latanoprost (XALATAN) 0.005 % ophthalmic solution Place 1 drop into the left eye at  bedtime as needed and may repeat dose one time if needed.     . Multiple Vitamin (MULTIVITAMIN WITH MINERALS) TABS tablet Take 1 tablet by mouth daily.    . ondansetron (ZOFRAN) 4 MG tablet Take 1 tablet (4 mg total) by mouth every 8 (eight) hours as needed for nausea or vomiting. 20 tablet 0  . pravastatin (PRAVACHOL) 40 MG tablet Take 1 tablet (40 mg total) by mouth at bedtime. 90 tablet 4  . predniSONE (DELTASONE) 1 MG tablet Take 1 mg by mouth daily with breakfast.    . ramipril (ALTACE) 2.5 MG capsule TAKE 1 BY MOUTH DAILY 90 capsule 3  . Tamsulosin HCl (FLOMAX) 0.4 MG CAPS Take 0.4 mg by mouth every evening.     . warfarin (COUMADIN) 5 MG tablet Take 1 tablet (5 mg total) by mouth daily. As directed per coumadin clinic 90 tablet 1   No current facility-administered medications for this visit.      Past Medical History:  Diagnosis Date  . Asthma    with worsening with beta blockade  . CAD (coronary artery disease)   . CVA (cerebral infarction) 2012   tia, mild no residual defecits  . GI bleed 8 yrs ago   due to doll fully vessel which was clipped  . Glaucoma    excellent cataracts  . Ischemic cardiomyopathy   . Nephrolithiasis   . Post-infarction apical thrombus ([place])   . Prostate cancer ([place]) 2012  . Radiation proctitis Feb 2015   treated with APC  . Tubular adenoma 04/2013   Dr. Hilarie Fredrickson    Past Surgical History:  Procedure Laterality  Date  . cardiac stents  2003  . COLONOSCOPY WITH PROPOFOL N/A 05/05/2013   Procedure: COLONOSCOPY WITH PROPOFOL;  Surgeon: Jerene Bears, MD;  Location: [facility];  Service: Gastroenterology;  Laterality: N/A;  . CORONARY ARTERY BYPASS GRAFT  1998   LIMA to the LAD and saphenous vein graft tothe diagonal  . history of radiation treatment  2012   x 40 treatments done    Social History   Social History  . Marital status: Married    Spouse name: N/A  . Number of children: 3  . Years of education: N/A   Occupational History  .  Retired Conservation officer, [employer]    Social History Main Topics  . Smoking status: Never Smoker  . Smokeless tobacco: Never Used  . Alcohol use 0.0 oz/week     Comment: occasional beer or wine  . Drug use: No  . Sexual activity: Not on file   Other Topics Concern  . Not on file   Social History Narrative  . No narrative on file    Family History  Problem Relation Age of Onset  . Heart attack Father   . Heart disease Mother   . Stomach cancer Mother     ROS: no fevers or chills, productive cough, hemoptysis, dysphasia, odynophagia, melena, hematochezia, dysuria, hematuria, rash, seizure activity, orthopnea, PND, pedal edema, claudication. Remaining systems are negative.  Physical Exam: Well-developed well-nourished in no acute distress.  Skin is warm and dry.  HEENT is normal.  Neck is supple.  Chest is clear to auscultation with normal expansion.  Cardiovascular exam is regular rate and rhythm.  Abdominal exam nontender or distended. No masses palpated. Extremities show no edema. neuro grossly intact  ECG-Sinus rhythm at a rate of 65. Incomplete right bundle branch block. Low voltage. Nonspecific ST changes.  A/P  1 Hyperlipidemia-continue statin.  2 coronary artery disease-continue statin. No aspirin given need for Coumadin.  3 ischemic cardiomyopathy-improved on most recent echocardiogram. Continue ACE inhibitor. He is intolerant to all beta blockers.  4 history of mural thrombus at the apex of the left ventricle-given history of TIA and continued evidence of thrombus on previous echo I think he will require Coumadin long-term. Patient provided the names of DOACs and he will contact us if they are for and we will change.  Kirk Ruths, MD

## 2016-01-31 DIAGNOSIS — M1711 Unilateral primary osteoarthritis, right knee: Secondary | ICD-10-CM | POA: Diagnosis not present

## 2016-02-04 ENCOUNTER — Ambulatory Visit (INDEPENDENT_AMBULATORY_CARE_PROVIDER_SITE_OTHER): Payer: Medicare Other | Admitting: Cardiology

## 2016-02-04 ENCOUNTER — Encounter: Payer: Self-pay | Admitting: Cardiology

## 2016-02-04 ENCOUNTER — Telehealth: Payer: Self-pay | Admitting: Cardiology

## 2016-02-04 VITALS — BP 112/66 | HR 65 | Ht 70.0 in | Wt 185.0 lb

## 2016-02-04 DIAGNOSIS — E7800 Pure hypercholesterolemia, unspecified: Secondary | ICD-10-CM

## 2016-02-04 DIAGNOSIS — I25810 Atherosclerosis of coronary artery bypass graft(s) without angina pectoris: Secondary | ICD-10-CM

## 2016-02-04 DIAGNOSIS — I6350 Cerebral infarction due to unspecified occlusion or stenosis of unspecified cerebral artery: Secondary | ICD-10-CM

## 2016-02-04 DIAGNOSIS — I4891 Unspecified atrial fibrillation: Secondary | ICD-10-CM

## 2016-02-04 NOTE — Telephone Encounter (Signed)
Spoke with pt, he reports the eliquis and xarelto would be affordable for him. Will forward for dr [name] review and dosing.

## 2016-02-04 NOTE — Telephone Encounter (Signed)
Please obtain most recent BMET and CBC from his primary care Kirk Ruths

## 2016-02-04 NOTE — Patient Instructions (Signed)
Your physician wants you to follow-up in: [place] will receive a reminder letter in the mail two months in advance. If you don't receive a letter, please call our office to schedule the follow-up appointment.   XARELTO OR PRADAXA OR ELIQUIS TO REPLACE WARFARIN  If you need a refill on your cardiac medications before your next appointment, please call your pharmacy.

## 2016-02-04 NOTE — Telephone Encounter (Signed)
New message  Pt call requesting to speak with RN. Pt states Dr. [name] spoke with him about dome different medications and pt would like to discuss with RN. Please call back to discuss

## 2016-02-05 NOTE — Telephone Encounter (Signed)
Spoke with pt, aware I have requested his labs from dr [employer]. Once reviewed by dr [name] will call him back with medication change.

## 2016-02-07 DIAGNOSIS — M1711 Unilateral primary osteoarthritis, right knee: Secondary | ICD-10-CM | POA: Diagnosis not present

## 2016-02-10 MED ORDER — APIXABAN 5 MG PO TABS: 5.0000 mg | ORAL_TABLET | Freq: Two times a day (BID) | ORAL | 0 refills | Status: DC

## 2016-02-10 MED ORDER — APIXABAN 5 MG PO TABS: 5.0000 mg | ORAL_TABLET | Freq: Two times a day (BID) | ORAL | 3 refills | Status: DC

## 2016-02-10 NOTE — Telephone Encounter (Signed)
Lab work reviewed by dr [name], patient instructed to stop warfarin. After he has been off warfarin for 4 days he will start eliquis 5 mg twice daily. He will have cbc and bmp 4 weeks after starting eliquis. Script sent to local pharmacy for pt to use the savings card and 2nd script sent to mail order. Lab orders mailed to the pt

## 2016-02-11 ENCOUNTER — Ambulatory Visit: Payer: Self-pay | Admitting: Pharmacist Clinician (PhC)/ Clinical Pharmacy Specialist

## 2016-02-11 DIAGNOSIS — Z5181 Encounter for therapeutic drug level monitoring: Secondary | ICD-10-CM

## 2016-02-14 ENCOUNTER — Encounter: Payer: Self-pay | Admitting: Cardiology

## 2016-02-14 NOTE — Telephone Encounter (Signed)
Please call,he said you were supposed do something for him.

## 2016-02-14 NOTE — Telephone Encounter (Signed)
This encounter was created in error - please disregard.

## 2016-02-17 DIAGNOSIS — L570 Actinic keratosis: Secondary | ICD-10-CM | POA: Diagnosis not present

## 2016-02-17 DIAGNOSIS — L821 Other seborrheic keratosis: Secondary | ICD-10-CM | POA: Diagnosis not present

## 2016-02-27 ENCOUNTER — Other Ambulatory Visit: Payer: Self-pay

## 2016-02-27 MED ORDER — RAMIPRIL 2.5 MG PO CAPS: 2.5000 mg | ORAL_CAPSULE | Freq: Every day | ORAL | 3 refills | Status: DC

## 2016-03-11 DIAGNOSIS — I4891 Unspecified atrial fibrillation: Secondary | ICD-10-CM | POA: Diagnosis not present

## 2016-03-11 LAB — CBC
HEMATOCRIT: 42.1 % (ref 38.5–50.0)
Hemoglobin: 13.8 g/dL (ref 13.2–17.1)
MCH: 31.9 pg (ref 27.0–33.0)
MCHC: 32.8 g/dL (ref 32.0–36.0)
MCV: 97.2 fL (ref 80.0–100.0)
MPV: 8.1 fL (ref 7.5–12.5)
Platelets: 287 10*3/uL (ref 140–400)
RBC: 4.33 MIL/uL (ref 4.20–5.80)
RDW: 12.8 % (ref 11.0–15.0)
WBC: 7.6 10*3/uL (ref 3.8–10.8)

## 2016-03-11 LAB — BASIC METABOLIC PANEL
BUN: 17 mg/dL (ref 7–25)
CHLORIDE: 107 mmol/L (ref 98–110)
CO2: 29 mmol/L (ref 20–31)
Calcium: 9.3 mg/dL (ref 8.6–10.3)
Creat: 0.92 mg/dL (ref 0.70–1.11)
Glucose, Bld: 82 mg/dL (ref 65–99)
POTASSIUM: 4.6 mmol/L (ref 3.5–5.3)
Sodium: 144 mmol/L (ref 135–146)

## 2016-03-18 DIAGNOSIS — Z961 Presence of intraocular lens: Secondary | ICD-10-CM | POA: Diagnosis not present

## 2016-03-18 DIAGNOSIS — H40051 Ocular hypertension, right eye: Secondary | ICD-10-CM | POA: Diagnosis not present

## 2016-03-18 DIAGNOSIS — H4032X3 Glaucoma secondary to eye trauma, left eye, severe stage: Secondary | ICD-10-CM | POA: Diagnosis not present

## 2016-03-19 DIAGNOSIS — G8929 Other chronic pain: Secondary | ICD-10-CM | POA: Diagnosis not present

## 2016-03-19 DIAGNOSIS — M25561 Pain in right knee: Secondary | ICD-10-CM | POA: Diagnosis not present

## 2016-03-19 DIAGNOSIS — M1711 Unilateral primary osteoarthritis, right knee: Secondary | ICD-10-CM | POA: Diagnosis not present

## 2016-04-02 DIAGNOSIS — M25552 Pain in left hip: Secondary | ICD-10-CM | POA: Diagnosis not present

## 2016-04-02 DIAGNOSIS — M9903 Segmental and somatic dysfunction of lumbar region: Secondary | ICD-10-CM | POA: Diagnosis not present

## 2016-04-02 DIAGNOSIS — M5127 Other intervertebral disc displacement, lumbosacral region: Secondary | ICD-10-CM | POA: Diagnosis not present

## 2016-04-02 DIAGNOSIS — M25551 Pain in right hip: Secondary | ICD-10-CM | POA: Diagnosis not present

## 2016-04-02 DIAGNOSIS — M5417 Radiculopathy, lumbosacral region: Secondary | ICD-10-CM | POA: Diagnosis not present

## 2016-04-02 DIAGNOSIS — M9905 Segmental and somatic dysfunction of pelvic region: Secondary | ICD-10-CM | POA: Diagnosis not present

## 2016-04-02 DIAGNOSIS — M9904 Segmental and somatic dysfunction of sacral region: Secondary | ICD-10-CM | POA: Diagnosis not present

## 2016-04-17 DIAGNOSIS — I679 Cerebrovascular disease, unspecified: Secondary | ICD-10-CM | POA: Diagnosis not present

## 2016-04-17 DIAGNOSIS — Z9861 Coronary angioplasty status: Secondary | ICD-10-CM | POA: Diagnosis not present

## 2016-04-17 DIAGNOSIS — I509 Heart failure, unspecified: Secondary | ICD-10-CM | POA: Diagnosis not present

## 2016-04-17 DIAGNOSIS — M859 Disorder of bone density and structure, unspecified: Secondary | ICD-10-CM | POA: Diagnosis not present

## 2016-04-17 DIAGNOSIS — M5430 Sciatica, unspecified side: Secondary | ICD-10-CM | POA: Diagnosis not present

## 2016-04-17 DIAGNOSIS — J449 Chronic obstructive pulmonary disease, unspecified: Secondary | ICD-10-CM | POA: Diagnosis not present

## 2016-04-17 DIAGNOSIS — D692 Other nonthrombocytopenic purpura: Secondary | ICD-10-CM | POA: Diagnosis not present

## 2016-04-17 DIAGNOSIS — I110 Hypertensive heart disease with heart failure: Secondary | ICD-10-CM | POA: Diagnosis not present

## 2016-04-17 DIAGNOSIS — E441 Mild protein-calorie malnutrition: Secondary | ICD-10-CM | POA: Diagnosis not present

## 2016-04-17 DIAGNOSIS — Z6827 Body mass index (BMI) 27.0-27.9, adult: Secondary | ICD-10-CM | POA: Diagnosis not present

## 2016-04-17 DIAGNOSIS — M1990 Unspecified osteoarthritis, unspecified site: Secondary | ICD-10-CM | POA: Diagnosis not present

## 2016-04-17 DIAGNOSIS — R7301 Impaired fasting glucose: Secondary | ICD-10-CM | POA: Diagnosis not present

## 2016-05-05 DIAGNOSIS — M25561 Pain in right knee: Secondary | ICD-10-CM | POA: Diagnosis not present

## 2016-05-05 DIAGNOSIS — G8929 Other chronic pain: Secondary | ICD-10-CM | POA: Diagnosis not present

## 2016-05-05 DIAGNOSIS — M1711 Unilateral primary osteoarthritis, right knee: Secondary | ICD-10-CM | POA: Diagnosis not present

## 2016-05-24 ENCOUNTER — Other Ambulatory Visit: Payer: Self-pay | Admitting: Cardiology

## 2016-05-25 ENCOUNTER — Other Ambulatory Visit: Payer: Self-pay | Admitting: *Deleted

## 2016-05-25 NOTE — Telephone Encounter (Signed)
Rx(s) sent to pharmacy electronically.  

## 2016-05-26 MED ORDER — FENOFIBRATE MICRONIZED 200 MG PO CAPS: 200.0000 mg | ORAL_CAPSULE | Freq: Every day | ORAL | 3 refills | Status: DC

## 2016-07-23 DIAGNOSIS — M9904 Segmental and somatic dysfunction of sacral region: Secondary | ICD-10-CM | POA: Diagnosis not present

## 2016-07-23 DIAGNOSIS — M25552 Pain in left hip: Secondary | ICD-10-CM | POA: Diagnosis not present

## 2016-07-23 DIAGNOSIS — M9905 Segmental and somatic dysfunction of pelvic region: Secondary | ICD-10-CM | POA: Diagnosis not present

## 2016-07-23 DIAGNOSIS — M5417 Radiculopathy, lumbosacral region: Secondary | ICD-10-CM | POA: Diagnosis not present

## 2016-07-23 DIAGNOSIS — M9903 Segmental and somatic dysfunction of lumbar region: Secondary | ICD-10-CM | POA: Diagnosis not present

## 2016-07-23 DIAGNOSIS — M5127 Other intervertebral disc displacement, lumbosacral region: Secondary | ICD-10-CM | POA: Diagnosis not present

## 2016-07-23 DIAGNOSIS — M25551 Pain in right hip: Secondary | ICD-10-CM | POA: Diagnosis not present

## 2016-09-24 DIAGNOSIS — Z961 Presence of intraocular lens: Secondary | ICD-10-CM | POA: Diagnosis not present

## 2016-09-24 DIAGNOSIS — H52203 Unspecified astigmatism, bilateral: Secondary | ICD-10-CM | POA: Diagnosis not present

## 2016-09-24 DIAGNOSIS — H4720 Unspecified optic atrophy: Secondary | ICD-10-CM | POA: Diagnosis not present

## 2016-09-24 DIAGNOSIS — H4032X3 Glaucoma secondary to eye trauma, left eye, severe stage: Secondary | ICD-10-CM | POA: Diagnosis not present

## 2016-10-12 DIAGNOSIS — M859 Disorder of bone density and structure, unspecified: Secondary | ICD-10-CM | POA: Diagnosis not present

## 2016-10-12 DIAGNOSIS — R7301 Impaired fasting glucose: Secondary | ICD-10-CM | POA: Diagnosis not present

## 2016-10-12 DIAGNOSIS — Z125 Encounter for screening for malignant neoplasm of prostate: Secondary | ICD-10-CM | POA: Diagnosis not present

## 2016-10-12 DIAGNOSIS — I110 Hypertensive heart disease with heart failure: Secondary | ICD-10-CM | POA: Diagnosis not present

## 2016-10-19 DIAGNOSIS — Z9861 Coronary angioplasty status: Secondary | ICD-10-CM | POA: Diagnosis not present

## 2016-10-19 DIAGNOSIS — Z1389 Encounter for screening for other disorder: Secondary | ICD-10-CM | POA: Diagnosis not present

## 2016-10-19 DIAGNOSIS — I6789 Other cerebrovascular disease: Secondary | ICD-10-CM | POA: Diagnosis not present

## 2016-10-19 DIAGNOSIS — E441 Mild protein-calorie malnutrition: Secondary | ICD-10-CM | POA: Diagnosis not present

## 2016-10-19 DIAGNOSIS — D692 Other nonthrombocytopenic purpura: Secondary | ICD-10-CM | POA: Diagnosis not present

## 2016-10-19 DIAGNOSIS — Z7901 Long term (current) use of anticoagulants: Secondary | ICD-10-CM | POA: Diagnosis not present

## 2016-10-19 DIAGNOSIS — Z8546 Personal history of malignant neoplasm of prostate: Secondary | ICD-10-CM | POA: Diagnosis not present

## 2016-10-19 DIAGNOSIS — Z0000 Encounter for general adult medical examination without abnormal findings: Secondary | ICD-10-CM | POA: Diagnosis not present

## 2016-10-19 DIAGNOSIS — I509 Heart failure, unspecified: Secondary | ICD-10-CM | POA: Diagnosis not present

## 2016-10-19 DIAGNOSIS — I110 Hypertensive heart disease with heart failure: Secondary | ICD-10-CM | POA: Diagnosis not present

## 2016-10-19 DIAGNOSIS — Z6828 Body mass index (BMI) 28.0-28.9, adult: Secondary | ICD-10-CM | POA: Diagnosis not present

## 2016-10-19 DIAGNOSIS — J449 Chronic obstructive pulmonary disease, unspecified: Secondary | ICD-10-CM | POA: Diagnosis not present

## 2016-10-20 DIAGNOSIS — Z1212 Encounter for screening for malignant neoplasm of rectum: Secondary | ICD-10-CM | POA: Diagnosis not present

## 2016-10-22 DIAGNOSIS — G8929 Other chronic pain: Secondary | ICD-10-CM | POA: Diagnosis not present

## 2016-10-22 DIAGNOSIS — M25561 Pain in right knee: Secondary | ICD-10-CM | POA: Diagnosis not present

## 2016-10-22 DIAGNOSIS — M1711 Unilateral primary osteoarthritis, right knee: Secondary | ICD-10-CM | POA: Diagnosis not present

## 2016-10-28 ENCOUNTER — Other Ambulatory Visit: Payer: Self-pay | Admitting: Orthopedic Surgery

## 2016-10-28 DIAGNOSIS — M1711 Unilateral primary osteoarthritis, right knee: Secondary | ICD-10-CM

## 2016-11-02 ENCOUNTER — Ambulatory Visit
Admission: RE | Admit: 2016-11-02 | Discharge: 2016-11-02 | Disposition: A | Discharge status: Home / Self Care | Payer: Medicare Other | Source: Ambulatory Visit | Attending: Orthopedic Surgery | Admitting: Orthopedic Surgery

## 2016-11-02 DIAGNOSIS — M1711 Unilateral primary osteoarthritis, right knee: Secondary | ICD-10-CM

## 2016-11-02 DIAGNOSIS — M7121 Synovial cyst of popliteal space [Baker], right knee: Secondary | ICD-10-CM | POA: Diagnosis not present

## 2016-11-26 ENCOUNTER — Encounter: Payer: Self-pay | Admitting: Physician Assistant

## 2016-11-26 ENCOUNTER — Ambulatory Visit (INDEPENDENT_AMBULATORY_CARE_PROVIDER_SITE_OTHER): Payer: Medicare Other | Admitting: Physician Assistant

## 2016-11-26 VITALS — BP 104/66 | HR 68 | Ht 70.0 in | Wt 187.4 lb

## 2016-11-26 DIAGNOSIS — Z01810 Encounter for preprocedural cardiovascular examination: Secondary | ICD-10-CM

## 2016-11-26 DIAGNOSIS — Z01818 Encounter for other preprocedural examination: Secondary | ICD-10-CM

## 2016-11-26 DIAGNOSIS — I513 Intracardiac thrombosis, not elsewhere classified: Secondary | ICD-10-CM

## 2016-11-26 DIAGNOSIS — I25810 Atherosclerosis of coronary artery bypass graft(s) without angina pectoris: Secondary | ICD-10-CM | POA: Diagnosis not present

## 2016-11-26 DIAGNOSIS — Z8673 Personal history of transient ischemic attack (TIA), and cerebral infarction without residual deficits: Secondary | ICD-10-CM

## 2016-11-26 DIAGNOSIS — I2109 ST elevation (STEMI) myocardial infarction involving other coronary artery of anterior wall: Secondary | ICD-10-CM

## 2016-11-26 NOTE — Progress Notes (Signed)
Cardiology Office Note    Date:  11/28/2016   ID:  Donald [name], DOB [birth year], MRN [id number]  PCP:  Haywood Pao, MD  Cardiologist:  Dr. [name]  Chief Complaint  Patient presents with  . Follow-up    pt to have knee replacement surgey in October 2018, no complaints today     History of Present Illness:  Donald [name] is a 83 y.o. male with PMH of CAD s/p CABG 1998, h/o ICM with improved EF, prior CVA, prior mural thrombus on chronic coumadin Rx, prostate CA, and h/o GI bleed. Carotid Doppler 2005 showed mild disease. Prior abdominal ultrasound in May 2006 show no aneurysm. He had a cardiac catheterization in June 2013 that showed 100% ost LAD, distal LAD filled with RV branch off of RCA, PLA possibly occluded by most distal RCA appears to be filled from LIMA collateral, LIMA patent but does not supply distal LAD. Medical therapy was recommended. Last echocardiogram obtained on 11/13/2013 showed EF 50-55%, akinesis of the entire apical myocardium, grade 1 DD, PA peak pressure 56 mmHg. Ejection fraction has improved from the previous 40-45% in 2007. He is intolerant to all beta blockers.  He presents today for cardiology office visit. He denies any chest discomfort or significant shortness of breath unless he really pushes himself. He never had chest pain even prior to the previous bypass surgery 20 years ago. He has upcoming right knee surgery by Dr. Maureen Ralphs. Unfortunately his wife has passed away last year. He does not have any lower extremity edema, orthopnea or PND. He is only on very low dose of ramipril for blood pressure. Despite his history of GI bleed, he is tolerating eliquis quite well. Based on recent lab work sent over by his PCP, his renal function and electrolyte, red blood cells were all normal. His total cholesterol 105, triglyceride 47, HDL 40, LDL 56. He still does his own cooking, he hires someone to come to his house to clean for him. Otherwise he does not do much  strenuous activity. I recommended a [employer], unless there is a large area of reversibility, I would likely clear him for surgery.   Past Medical History:  Diagnosis Date  . Asthma    with worsening with beta blockade  . CAD (coronary artery disease)   . CVA (cerebral infarction) 2012   tia, mild no residual defecits  . GI bleed 8 yrs ago   due to doll fully vessel which was clipped  . Glaucoma    excellent cataracts  . Ischemic cardiomyopathy   . Nephrolithiasis   . Post-infarction apical thrombus ([place])   . Prostate cancer ([place]) 2012  . Radiation proctitis Feb 2015   treated with APC  . Tubular adenoma 04/2013   Dr. Hilarie Fredrickson    Past Surgical History:  Procedure Laterality Date  . cardiac stents  2003  . COLONOSCOPY WITH PROPOFOL N/A 05/05/2013   Procedure: COLONOSCOPY WITH PROPOFOL;  Surgeon: Jerene Bears, MD;  Location: [facility];  Service: Gastroenterology;  Laterality: N/A;  . CORONARY ARTERY BYPASS GRAFT  1998   LIMA to the LAD and saphenous vein graft tothe diagonal  . history of radiation treatment  2012   x 40 treatments done    Current Medications: Outpatient Medications Prior to Visit  Medication Sig Dispense Refill  . apixaban (ELIQUIS) 5 MG TABS tablet Take 1 tablet (5 mg total) by mouth 2 (two) times daily. 60 tablet 0  . calcium-vitamin D 250-100  MG-UNIT per tablet Take 1 tablet by mouth daily.     . Cholecalciferol (VITAMIN D) 2000 UNITS tablet Take 2,000 Units by mouth daily.    . fenofibrate micronized (LOFIBRA) 200 MG capsule Take 1 capsule (200 mg total) by mouth daily. 90 capsule 3  . Fluticasone Furoate-Vilanterol (BREO ELLIPTA) 100-25 MCG/INH AEPB Inhale into the lungs every morning.    . latanoprost (XALATAN) 0.005 % ophthalmic solution Place 1 drop into the left eye at bedtime.     . Multiple Vitamin (MULTIVITAMIN WITH MINERALS) TABS tablet Take 1 tablet by mouth daily.    . pravastatin (PRAVACHOL) 40 MG tablet Take 1 tablet (40 mg total) by  mouth at bedtime. 90 tablet 4  . ramipril (ALTACE) 2.5 MG capsule Take 1 capsule (2.5 mg total) by mouth daily. 90 capsule 3  . Tamsulosin HCl (FLOMAX) 0.4 MG CAPS Take 0.4 mg by mouth every evening.     . enoxaparin (LOVENOX) 80 MG/0.8ML injection Inject 80 mg (1 syringe) subcutaneously every 12 hours as directed (Patient not taking: Reported on 11/26/2016) 14 Syringe 0  . fenofibrate micronized (LOFIBRA) 200 MG capsule TAKE 1 CAPSULE BY MOUTH DAILY (Patient not taking: Reported on 11/26/2016) 90 capsule 2  . ondansetron (ZOFRAN) 4 MG tablet Take 1 tablet (4 mg total) by mouth every 8 (eight) hours as needed for nausea or vomiting. (Patient not taking: Reported on 11/26/2016) 20 tablet 0  . predniSONE (DELTASONE) 1 MG tablet Take 1 mg by mouth daily with breakfast.     No facility-administered medications prior to visit.      Allergies:   Keflex [cephalexin] and Morphine   Social History   Social History  . Marital status: Widowed    Spouse name: N/A  . Number of children: 3  . Years of education: N/A   Occupational History  . Retired Conservation officer, [employer]    Social History Main Topics  . Smoking status: Never Smoker  . Smokeless tobacco: Never Used  . Alcohol use 0.0 oz/week     Comment: occasional beer or wine  . Drug use: No  . Sexual activity: Not Asked   Other Topics Concern  . None   Social History Narrative  . None     Family History:  The patient's family history includes Heart attack in his father; Heart disease in his mother; Stomach cancer in his mother.   ROS:   Please see the history of present illness.    ROS All other systems reviewed and are negative.   PHYSICAL EXAM:   VS:  BP 104/66   Pulse 68   Ht 5\' 10"  (1.778 m)   Wt 187 lb 6.4 oz (85 kg)   BMI 26.89 kg/m    GEN: Well nourished, well developed, in no acute distress  HEENT: normal  Neck: no JVD, carotid bruits, or masses Cardiac: RRR; no murmurs, rubs, or gallops,no edema  Respiratory:  clear to  auscultation bilaterally, normal work of breathing GI: soft, nontender, nondistended, + BS MS: no deformity or atrophy  Skin: warm and dry, no rash Neuro:  Alert and Oriented x 3, Strength and sensation are intact Psych: euthymic mood, full affect  Wt Readings from Last 3 Encounters:  11/26/16 187 lb 6.4 oz (85 kg)  02/04/16 185 lb (83.9 kg)  06/10/15 180 lb (81.6 kg)      Studies/Labs Reviewed:   EKG:  EKG is ordered today.  The ekg ordered today demonstrates NSR with RBBB and PVC  Recent  Labs: 03/11/2016: BUN 17; Creat 0.92; Hemoglobin 13.8; Platelets 287; Potassium 4.6; Sodium 144   Lipid Panel    Component Value Date/Time   CHOL 114 11/30/2012 0842   TRIG 57.0 11/30/2012 0842   TRIG 44 08/13/2008   HDL 47.00 11/30/2012 0842   CHOLHDL 2 11/30/2012 0842   VLDL 11.4 11/30/2012 0842   LDLCALC 56 11/30/2012 0842    Additional studies/ records that were reviewed today include:   Echo 11/13/2013 LV EF: 50% -  55%  Study Conclusions  - Left ventricle: There appears to be a persistent smal layered mural thrombus by definity contrast study. distal septal akinesis. The cavity size was normal. Systolic function was normal. The estimated ejection fraction was in the range of 50% to 55%. There is akinesis of the entireapical myocardium. There was an increased relative contribution of atrial contraction to ventricular filling. Doppler parameters are consistent with abnormal left ventricular relaxation (grade 1 diastolic dysfunction). - Aortic valve: Moderate diffuse calcification, consistent with sclerosis. There was trivial regurgitation. - Mitral valve: Calcified annulus. Mildly thickened leaflets . Mild calcification. There was trivial regurgitation. - Left atrium: The atrium was mildly dilated. - Right ventricle: The cavity size was moderately dilated. Wall thickness was normal. - Pulmonary arteries: PA peak pressure: 56 mm Hg  (S).  Impressions:  - The right ventricular systolic pressure was increased consistent with moderate pulmonary hypertension.   ASSESSMENT:    1. Preop cardiovascular exam   2. Coronary artery disease involving coronary bypass graft of native heart without angina pectoris   3. Pre-op testing   4. Mural thrombus of cardiac apex   5. H/O: CVA (cerebrovascular accident)      PLAN:  In order of problems listed above:  1. Preoperative clearance: He only has some shortness of breath with extreme activities. He is 20 years out from bypass surgery, last cardiac catheterization was over 5 years ago. He has not had any further ischemic workup since, will order a treadmill Myoview to further assess prior to clearance. Unless there is significant area of ischemia, I will likely cleared him. He will need to hold eliquis for 48 hours prior to surgery though.  2. CAD s/p CABG: Last cardiac catheterization was in June 2013  3. Mural thrombus: on eliquis, will need to hold eliquis for 48 hours prior to surgery  4. H/o CVA: No neurological deficit.    Medication Adjustments/Labs and Tests Ordered: Current medicines are reviewed at length with the patient today.  Concerns regarding medicines are outlined above.  Medication changes, Labs and Tests ordered today are listed in the Patient Instructions below. Patient Instructions  Medication Instructions:   NO CHANGE  Testing/Procedures:  Your physician has requested that you have a lexiscan myoview. For further information please visit [email or web address]. Please follow instruction sheet, as given.    Follow-Up:  Your physician wants you to follow-up in: [place] will receive a reminder letter in the mail two months in advance. If you don't receive a letter, please call our office to schedule the follow-up appointment.   If you need a refill on your cardiac medications before your next appointment, please call your  pharmacy.     Hilbert Corrigan, [place]  11/28/2016 8:27 AM    [address], [place], [place]  [address] Phone: [phone]; Fax: [phone]

## 2016-11-26 NOTE — Patient Instructions (Signed)
Medication Instructions:   NO CHANGE  Testing/Procedures:  Your physician has requested that you have a lexiscan myoview. For further information please visit [email or web address]. Please follow instruction sheet, as given.    Follow-Up:  Your physician wants you to follow-up in: ONE YEAR WITH DR CRENSHAW You will receive a reminder letter in the mail two months in advance. If you don't receive a letter, please call our office to schedule the follow-up appointment.   If you need a refill on your cardiac medications before your next appointment, please call your pharmacy.  

## 2016-11-28 ENCOUNTER — Encounter: Payer: Self-pay | Admitting: Physician Assistant

## 2016-12-01 ENCOUNTER — Telehealth (HOSPITAL_COMMUNITY): Payer: Self-pay

## 2016-12-01 ENCOUNTER — Ambulatory Visit: Payer: Medicare Other | Admitting: Physician Assistant

## 2016-12-01 NOTE — Telephone Encounter (Signed)
Encounter complete. 

## 2016-12-03 ENCOUNTER — Ambulatory Visit (HOSPITAL_COMMUNITY)
Admission: RE | Admit: 2016-12-03 | Discharge: 2016-12-03 | Disposition: A | Discharge status: Home / Self Care | Payer: Medicare Other | Source: Ambulatory Visit | Attending: Internal Medicine | Admitting: Internal Medicine

## 2016-12-03 DIAGNOSIS — I4510 Unspecified right bundle-branch block: Secondary | ICD-10-CM | POA: Diagnosis not present

## 2016-12-03 DIAGNOSIS — I4891 Unspecified atrial fibrillation: Secondary | ICD-10-CM | POA: Insufficient documentation

## 2016-12-03 DIAGNOSIS — I255 Ischemic cardiomyopathy: Secondary | ICD-10-CM | POA: Insufficient documentation

## 2016-12-03 DIAGNOSIS — J449 Chronic obstructive pulmonary disease, unspecified: Secondary | ICD-10-CM | POA: Diagnosis not present

## 2016-12-03 DIAGNOSIS — I25810 Atherosclerosis of coronary artery bypass graft(s) without angina pectoris: Secondary | ICD-10-CM | POA: Insufficient documentation

## 2016-12-03 DIAGNOSIS — I2510 Atherosclerotic heart disease of native coronary artery without angina pectoris: Secondary | ICD-10-CM | POA: Diagnosis not present

## 2016-12-03 DIAGNOSIS — Z8042 Family history of malignant neoplasm of prostate: Secondary | ICD-10-CM | POA: Diagnosis not present

## 2016-12-03 DIAGNOSIS — Z8546 Personal history of malignant neoplasm of prostate: Secondary | ICD-10-CM | POA: Diagnosis not present

## 2016-12-03 DIAGNOSIS — N200 Calculus of kidney: Secondary | ICD-10-CM | POA: Diagnosis not present

## 2016-12-03 DIAGNOSIS — Z01818 Encounter for other preprocedural examination: Secondary | ICD-10-CM | POA: Diagnosis not present

## 2016-12-03 DIAGNOSIS — Z8249 Family history of ischemic heart disease and other diseases of the circulatory system: Secondary | ICD-10-CM | POA: Insufficient documentation

## 2016-12-03 DIAGNOSIS — Z951 Presence of aortocoronary bypass graft: Secondary | ICD-10-CM | POA: Insufficient documentation

## 2016-12-03 LAB — MYOCARDIAL PERFUSION IMAGING
CHL CUP NUCLEAR SRS: 10
CSEPPHR: 87 {beats}/min
LVDIAVOL: 102 mL (ref 62–150)
LVSYSVOL: 55 mL
Rest HR: 62 {beats}/min
SDS: 1
SSS: 11
TID: 1.09

## 2016-12-03 MED ORDER — REGADENOSON 0.4 MG/5ML IV SOLN
0.4000 mg | Freq: Once | INTRAVENOUS | Status: AC
Start: 2016-12-03 — End: 2016-12-03
  Administered 2016-12-03: 0.4 mg via INTRAVENOUS

## 2016-12-03 MED ORDER — TECHNETIUM TC 99M TETROFOSMIN IV KIT
30.9000 | PACK | Freq: Once | INTRAVENOUS | Status: AC | PRN
  Administered 2016-12-03: 30.9 via INTRAVENOUS
  Filled 2016-12-03: qty 31

## 2016-12-03 MED ORDER — TECHNETIUM TC 99M TETROFOSMIN IV KIT
10.5000 | PACK | Freq: Once | INTRAVENOUS | Status: AC | PRN
  Administered 2016-12-03: 10.5 via INTRAVENOUS
  Filled 2016-12-03: qty 11

## 2016-12-03 NOTE — Progress Notes (Signed)
I have called and informed the patient myself

## 2016-12-03 NOTE — Progress Notes (Signed)
Known scars in the heart, but no ischemia. He is cleared for surgery

## 2016-12-28 ENCOUNTER — Ambulatory Visit: Payer: Medicare Other | Admitting: Cardiology

## 2016-12-29 ENCOUNTER — Ambulatory Visit: Payer: Self-pay | Admitting: Orthopedic Surgery

## 2017-01-05 ENCOUNTER — Other Ambulatory Visit (HOSPITAL_COMMUNITY): Payer: Self-pay | Admitting: Emergency Medicine

## 2017-01-05 NOTE — Patient Instructions (Signed)
Donald [name]  01/05/2017   Your procedure is scheduled on: 01-13-17  Report to [facility] Main  Entrance    Report to admitting at 745AM   Call this number if you have problems the morning of surgery  [phone]   Remember: ONLY 1 PERSON MAY GO WITH YOU TO SHORT STAY TO GET  READY MORNING OF YOUR SURGERY.  Do not eat food or drink liquids :After Midnight.     Take these medicines the morning of surgery with A SIP OF WATER: tylenol if needed, inhalers if needed (may bring to hospital), loratadine(claritin)                                 You may not have any metal on your body including hair pins and              piercings  Do not wear jewelry, make-up, lotions, powders or perfumes, deodorant                       Men may shave face and neck.   Do not bring valuables to the hospital. [place].  Contacts, dentures or bridgework may not be worn into surgery.  Leave suitcase in the car. After surgery it may be brought to your room.                Please read over the following fact sheets you were given: _____________________________________________________________________           [facility] - Preparing for Surgery Before surgery, you can play an important role.  Because skin is not sterile, your skin needs to be as free of germs as possible.  You can reduce the number of germs on your skin by washing with CHG (chlorahexidine gluconate) soap before surgery.  CHG is an antiseptic cleaner which kills germs and bonds with the skin to continue killing germs even after washing. Please DO NOT use if you have an allergy to CHG or antibacterial soaps.  If your skin becomes reddened/irritated stop using the CHG and inform your nurse when you arrive at Short Stay. Do not shave (including legs and underarms) for at least 48 hours prior to the first CHG shower.  You may shave your face/neck. Please follow these  instructions carefully:  1.  Shower with CHG Soap the night before surgery and the  morning of Surgery.  2.  If you choose to wash your hair, wash your hair first as usual with your  normal  shampoo.  3.  After you shampoo, rinse your hair and body thoroughly to remove the  shampoo.                           4.  Use CHG as you would any other liquid soap.  You can apply chg directly  to the skin and wash                       Gently with a scrungie or clean washcloth.  5.  Apply the CHG Soap to your body ONLY FROM THE NECK DOWN.   Do not use on face/  open                           Wound or open sores. Avoid contact with eyes, ears mouth and genitals (private parts).                       Wash face,  Genitals (private parts) with your normal soap.             6.  Wash thoroughly, paying special attention to the area where your surgery  will be performed.  7.  Thoroughly rinse your body with warm water from the neck down.  8.  DO NOT shower/wash with your normal soap after using and rinsing off  the CHG Soap.                9.  Pat yourself dry with a clean towel.            10.  Wear clean pajamas.            11.  Place clean sheets on your bed the night of your first shower and do not  sleep with pets. Day of Surgery : Do not apply any lotions/deodorants the morning of surgery.  Please wear clean clothes to the hospital/surgery center.  FAILURE TO FOLLOW THESE INSTRUCTIONS MAY RESULT IN THE CANCELLATION OF YOUR SURGERY PATIENT SIGNATURE_________________________________  NURSE SIGNATURE__________________________________  ________________________________________________________________________   Donald [name]  An incentive spirometer is a tool that can help keep your lungs clear and active. This tool measures how well you are filling your lungs with each breath. Taking long deep breaths may help reverse or decrease the chance of developing breathing (pulmonary) problems (especially  infection) following:  A long period of time when you are unable to move or be active. BEFORE THE PROCEDURE   If the spirometer includes an indicator to show your best effort, your nurse or respiratory therapist will set it to a desired goal.  If possible, sit up straight or lean slightly forward. Try not to slouch.  Hold the incentive spirometer in an upright position. INSTRUCTIONS FOR USE  1. Sit on the edge of your bed if possible, or sit up as far as you can in bed or on a chair. 2. Hold the incentive spirometer in an upright position. 3. Breathe out normally. 4. Place the mouthpiece in your mouth and seal your lips tightly around it. 5. Breathe in slowly and as deeply as possible, raising the piston or the ball toward the top of the column. 6. Hold your breath for 3-5 seconds or for as long as possible. Allow the piston or ball to fall to the bottom of the column. 7. Remove the mouthpiece from your mouth and breathe out normally. 8. Rest for a few seconds and repeat Steps 1 through 7 at least 10 times every 1-2 hours when you are awake. Take your time and take a few normal breaths between deep breaths. 9. The spirometer may include an indicator to show your best effort. Use the indicator as a goal to work toward during each repetition. 10. After each set of 10 deep breaths, practice coughing to be sure your lungs are clear. If you have an incision (the cut made at the time of surgery), support your incision when coughing by placing a pillow or rolled up towels firmly against it. Once you are able to get out of bed, walk around indoors and  cough well. You may stop using the incentive spirometer when instructed by your caregiver.  RISKS AND COMPLICATIONS  Take your time so you do not get dizzy or light-headed.  If you are in pain, you may need to take or ask for pain medication before doing incentive spirometry. It is harder to take a deep breath if you are having pain. AFTER  USE  Rest and breathe slowly and easily.  It can be helpful to keep track of a log of your progress. Your caregiver can provide you with a simple table to help with this. If you are using the spirometer at home, follow these instructions: [place] IF:   You are having difficultly using the spirometer.  You have trouble using the spirometer as often as instructed.  Your pain medication is not giving enough relief while using the spirometer.  You develop fever of 100.5 F (38.1 C) or higher. SEEK IMMEDIATE MEDICAL CARE IF:   You cough up bloody sputum that had not been present before.  You develop fever of 102 F (38.9 C) or greater.  You develop worsening pain at or near the incision site. MAKE SURE YOU:   Understand these instructions.  Will watch your condition.  Will get help right away if you are not doing well or get worse. Document Released: 07/27/2006 Document Revised: 06/08/2011 Document Reviewed: 09/27/2006 ExitCare Patient Information 2014 Exit[employer], [place].   ________________________________________________________________________  WHAT IS A BLOOD TRANSFUSION? Blood Transfusion Information  A transfusion is the replacement of blood or some of its parts. Blood is made up of multiple cells which provide different functions.  Red blood cells carry oxygen and are used for blood loss replacement.  White blood cells fight against infection.  Platelets control bleeding.  Plasma helps clot blood.  Other blood products are available for specialized needs, such as hemophilia or other clotting disorders. BEFORE THE TRANSFUSION  Who gives blood for transfusions?   Healthy volunteers who are fully evaluated to make sure their blood is safe. This is blood bank blood. Transfusion therapy is the safest it has ever been in the practice of medicine. Before blood is taken from a donor, a complete history is taken to make sure that person has no history of diseases  nor engages in risky social behavior (examples are intravenous drug use or sexual activity with multiple partners). The donor's travel history is screened to minimize risk of transmitting infections, such as malaria. The donated blood is tested for signs of infectious diseases, such as HIV and hepatitis. The blood is then tested to be sure it is compatible with you in order to minimize the chance of a transfusion reaction. If you or a relative donates blood, this is often done in anticipation of surgery and is not appropriate for emergency situations. It takes many days to process the donated blood. RISKS AND COMPLICATIONS Although transfusion therapy is very safe and saves many lives, the main dangers of transfusion include:   Getting an infectious disease.  Developing a transfusion reaction. This is an allergic reaction to something in the blood you were given. Every precaution is taken to prevent this. The decision to have a blood transfusion has been considered carefully by your caregiver before blood is given. Blood is not given unless the benefits outweigh the risks. AFTER THE TRANSFUSION  Right after receiving a blood transfusion, you will usually feel much better and more energetic. This is especially true if your red blood cells have gotten low (anemic).  The transfusion raises the level of the red blood cells which carry oxygen, and this usually causes an energy increase.  The nurse administering the transfusion will monitor you carefully for complications. HOME CARE INSTRUCTIONS  No special instructions are needed after a transfusion. You may find your energy is better. Speak with your caregiver about any limitations on activity for underlying diseases you may have. SEEK MEDICAL CARE IF:   Your condition is not improving after your transfusion.  You develop redness or irritation at the intravenous (IV) site. SEEK IMMEDIATE MEDICAL CARE IF:  Any of the following symptoms occur over the  next 12 hours:  Shaking chills.  You have a temperature by mouth above 102 F (38.9 C), not controlled by medicine.  Chest, back, or muscle pain.  People around you feel you are not acting correctly or are confused.  Shortness of breath or difficulty breathing.  Dizziness and fainting.  You get a rash or develop hives.  You have a decrease in urine output.  Your urine turns a dark color or changes to pink, red, or brown. Any of the following symptoms occur over the next 10 days:  You have a temperature by mouth above 102 F (38.9 C), not controlled by medicine.  Shortness of breath.  Weakness after normal activity.  The white part of the eye turns yellow (jaundice).  You have a decrease in the amount of urine or are urinating less often.  Your urine turns a dark color or changes to pink, red, or brown. Document Released: 03/13/2000 Document Revised: 06/08/2011 Document Reviewed: 10/31/2007 [facility] Patient Information 2014 [place], [place].  _______________________________________________________________________

## 2017-01-05 NOTE — Progress Notes (Signed)
Cardiac clearance Donald [name] 12-03-16 epic progress note   LOV cardiology Donald [name] 11-26-16 epic  Stress test 12-03-16 epic  EKG 11-26-16 epic

## 2017-01-06 ENCOUNTER — Encounter (HOSPITAL_COMMUNITY)
Admission: RE | Admit: 2017-01-06 | Discharge: 2017-01-06 | Disposition: A | Discharge status: Home / Self Care | Payer: Medicare Other | Source: Ambulatory Visit | Attending: Orthopedic Surgery | Admitting: Orthopedic Surgery

## 2017-01-06 ENCOUNTER — Encounter (INDEPENDENT_AMBULATORY_CARE_PROVIDER_SITE_OTHER): Payer: Self-pay

## 2017-01-06 ENCOUNTER — Encounter (HOSPITAL_COMMUNITY): Payer: Self-pay

## 2017-01-06 DIAGNOSIS — M1711 Unilateral primary osteoarthritis, right knee: Secondary | ICD-10-CM | POA: Insufficient documentation

## 2017-01-06 DIAGNOSIS — Z01818 Encounter for other preprocedural examination: Secondary | ICD-10-CM | POA: Insufficient documentation

## 2017-01-06 LAB — COMPREHENSIVE METABOLIC PANEL
ALBUMIN: 3.8 g/dL (ref 3.5–5.0)
ALK PHOS: 34 U/L — AB (ref 38–126)
ALT: 13 U/L — AB (ref 17–63)
AST: 22 U/L (ref 15–41)
Anion gap: 7 (ref 5–15)
BILIRUBIN TOTAL: 0.6 mg/dL (ref 0.3–1.2)
BUN: 22 mg/dL — AB (ref 6–20)
CALCIUM: 9.3 mg/dL (ref 8.9–10.3)
CO2: 26 mmol/L (ref 22–32)
Chloride: 106 mmol/L (ref 101–111)
Creatinine, Ser: 1.12 mg/dL (ref 0.61–1.24)
GFR calc Af Amer: >60 mL/min (ref >60)
GFR calc non Af Amer: 57 mL/min — ABNORMAL LOW (ref >60)
GLUCOSE: 103 mg/dL — AB (ref 65–99)
Potassium: 4.3 mmol/L (ref 3.5–5.1)
SODIUM: 139 mmol/L (ref 135–145)
TOTAL PROTEIN: 6.4 g/dL — AB (ref 6.5–8.1)

## 2017-01-06 LAB — APTT: APTT: 30 s (ref 24–36)

## 2017-01-06 LAB — CBC
HEMATOCRIT: 41.5 % (ref 39.0–52.0)
HEMOGLOBIN: 13.7 g/dL (ref 13.0–17.0)
MCH: 31.6 pg (ref 26.0–34.0)
MCHC: 33 g/dL (ref 30.0–36.0)
MCV: 95.8 fL (ref 78.0–100.0)
Platelets: 248 10*3/uL (ref 150–400)
RBC: 4.33 MIL/uL (ref 4.22–5.81)
RDW: 13.8 % (ref 11.5–15.5)
WBC: 3.9 10*3/uL — AB (ref 4.0–10.5)

## 2017-01-06 LAB — ABO/RH: ABO/RH(D): A POS

## 2017-01-06 LAB — PROTIME-INR
INR: 1.16
Prothrombin Time: 14.7 seconds (ref 11.4–15.2)

## 2017-01-06 LAB — SURGICAL PCR SCREEN
MRSA, PCR: NEGATIVE
STAPHYLOCOCCUS AUREUS: NEGATIVE

## 2017-01-08 ENCOUNTER — Ambulatory Visit: Payer: Self-pay | Admitting: Orthopedic Surgery

## 2017-01-08 NOTE — H&P (Signed)
Donald [name] DOB: [birth year] Married / Language: English / Race: White Male Date of Admission:  01/13/2017 CC:  Right knee pain History of Present Illness The patient is a 83 year old male who comes in  for a preoperative History and Physical. The patient is scheduled for a right unicompartmental knee arthroplasty to be performed by Donald [name]. Aluisio, MD at [facility] on 01/13/2017. The patient is a 83 year old male who was seen in the office as a surgical consult. The patient reported right knee symptoms including: pain, instability, stiffness, soreness and grinding which began year(s) ago without any known injury.The patient felt that the symptoms were getting worsening. The patient has the current diagnosis of knee osteoarthritis. Prior to being seen fro consideration of surgery, the patient was previously evaluated in this clinic (by Donald [name]). Previous work-up for this problem has included knee x-rays and arthroscopy (07/10/15). Past treatment for this problem has included intra-articular injection of corticosteroids (as well as Euflexxa series). Donald [name] has complaints of significant RIGHT medial knee pain. The pain is isolated to the medial side of the knee. It is hurting with most activities. It is limiting what he can and cannot do. He is at a stage we would like to remain active but the knee is preventing him from doing so. He has undergone cortisone and Euflexxa injections without much benefit. It is felt that he would benefit from undergoing a partial knee replacement. They have been treated conservatively in the past for the above stated problem and despite conservative measures, they continue to have progressive pain and severe functional limitations and dysfunction. They have failed non-operative management including home exercise, medications, and injections. It is felt that they would benefit from undergoing unicompartmental knee replacement. Risks and benefits of  the procedure have been discussed with the patient and they elect to proceed with surgery. There are no active contraindications to surgery such as ongoing infection or rapidly progressive neurological disease.  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 age 2 Mother  Deceased. age 53  Social History  Alcohol use  current drinker; drinks beer and wine; only occasionally per week Children  3 Current work status  retired Engineer, agricultural (Currently)  no Drug/Alcohol Rehab (Previously)  no Exercise  Exercises daily; does running / walking Illicit drug use  no Living situation  live with spouse Marital status  married Number of flights of stairs before winded  2-3 Pain Contract  no Tobacco use  Never smoker.  Medication History Eliquis (5MG  Tablet, Oral) Active. Pravastatin Sodium (40MG  Tablet, Oral) Active. Fenofibrate Micronized (200MG  Capsule, 1 Oral daily) Active. Ramipril (2.5MG  Capsule, 2 Oral daily) Active. Tamsulosin HCl (0.4MG  Capsule, 1 Oral daily) Active. Multivitamins (1 Oral daily) Active. Vitamin D3 (2000UNIT Capsule, 1 Oral daily) Active. Xalatan (0.005% Solution, 1 Ophthalmic daily) Active.  Past Surgical History Arthroscopic Knee Surgery - Right [07/10/2015]: Cataract Surgery  bilateral Coronary Artery Bypass Graft  3 vessels Heart Stents   Review of Systems General  Not Present- Chills, Fatigue, Fever,  Memory Loss, Night Sweats, Weight Gain and Weight Loss. Skin Not Present- Eczema, Hives, Itching, Lesions and Rash. HEENT Not Present- Dentures, Double Vision, Headache, Hearing Loss, Tinnitus and Visual Loss. Respiratory Not Present- Allergies, Chronic Cough, Coughing up blood, Shortness of breath at rest and Shortness of breath with exertion. Cardiovascular Not Present- Chest Pain, Difficulty Breathing Lying Down, Murmur, Palpitations, Racing/skipping heartbeats and Swelling. Gastrointestinal Not Present- Abdominal Pain, Bloody Stool, Constipation, Diarrhea, Difficulty Swallowing, Heartburn, Jaundice, Loss of appetitie, Nausea and Vomiting. Male Genitourinary Not Present- Blood in Urine, Discharge, Flank Pain, Incontinence, Painful Urination, Urgency, Urinary frequency, Urinary Retention, Urinating at Night and Weak urinary stream. Musculoskeletal Present- Joint Pain. Not Present- Back Pain, Joint Swelling, Morning Stiffness, Muscle Pain, Muscle Weakness and Spasms. Neurological Not Present- Blackout spells, Difficulty with balance, Dizziness, Paralysis, Tremor and Weakness. Psychiatric Not Present- Insomnia.  Vitals  Weight: 180 lb Height: 69in Weight was reported by patient. Height was reported by patient. Body Surface Area: 1.98 m Body Mass Index: 26.58 kg/m  Pulse: 68 (Regular)  Resp.: 16 (Unlabored)  BP: 104/64 (Sitting, Right Arm, Standard)    Physical Exam  General Mental Status -Alert, cooperative and good historian. General Appearance-pleasant, Not in acute distress. Orientation-Oriented X3. Build & Nutrition-Well nourished and Well developed.  Head and Neck Head-normocephalic, atraumatic . Neck Global Assessment - supple, no bruit auscultated on the right, no bruit auscultated on the left.  Eye Vision-Wears corrective lenses. Pupil - Bilateral-Regular and Round. Motion - Bilateral-EOMI.  Chest and Lung Exam Auscultation Breath sounds -  clear at anterior chest wall and clear at posterior chest wall. Adventitious sounds - No Adventitious sounds.  Cardiovascular Auscultation Rhythm - Regular rate and rhythm. Heart Sounds - S1 WNL and S2 WNL. Murmurs & Other Heart Sounds - Auscultation of the heart reveals - No Murmurs.  Abdomen Palpation/Percussion Tenderness - Abdomen is non-tender to palpation. Rigidity (guarding) - Abdomen is soft. Auscultation Auscultation of the abdomen reveals - Bowel sounds normal.  Male Genitourinary Note: Not done, not pertinent to present illness   Musculoskeletal Note: Evaluation of the left hip shows flexion to 120 rotation in 30 out 40 and abduction 40 without discomfort. There is no tenderness over the greater trochanter. There is no pain on provocative testing of the hip.Examination of the right hip shows flexion to 120 rotation in 30 abduction 40 and external rotation of 40. There is no tenderness over the greater trochanter. There is no pain on provocative testing of the hip.Left knee shows no effusion. Range of motion 0-125. No medial or lateral joint line tenderness. No instability. No crepitus on range of motion. his RIGHT knee shows no effusion. His range of motion is 0-125. He is tender along the medial aspect of the RIGHT knee. There is no lateral tenderness noted. There is no instability of the knee. Gait pattern is slightly antalgic on the RIGHT. Pulse sensation and motor are intact distally.  Radiographs-AP and lateral of the knees show significant medial joint space narrowing with bone-on-bone change. He has no patellofemoral or lateral involvement.   Assessment & Plan  Primary osteoarthritis of right knee (M17.11)  Note:Surgical Plans: Right Unicompartmental Knee Replacement  Disposition: Home  PCP: Dr. Osborne Casco Crads: Dr. [name]  Topical TXA - Prostate Cancer  Anesthesia Issues: None  Patient was instructed on what medications to stop prior to  surgery.  Signed electronically by Joelene Millin, III PA-C

## 2017-01-13 ENCOUNTER — Encounter (HOSPITAL_COMMUNITY)
Admission: RE | Disposition: A | Discharge status: Home / Self Care | Payer: Self-pay | Source: Ambulatory Visit | Attending: Orthopedic Surgery

## 2017-01-13 ENCOUNTER — Observation Stay (HOSPITAL_COMMUNITY)
Admission: RE | Admit: 2017-01-13 | Discharge: 2017-01-14 | Disposition: A | Discharge status: Home / Self Care | Payer: Medicare Other | Source: Ambulatory Visit | Attending: Orthopedic Surgery | Admitting: Orthopedic Surgery

## 2017-01-13 ENCOUNTER — Ambulatory Visit (HOSPITAL_COMMUNITY): Payer: Medicare Other | Admitting: Certified Registered Nurse Anesthetist

## 2017-01-13 ENCOUNTER — Encounter (HOSPITAL_COMMUNITY): Payer: Self-pay | Admitting: Certified Registered Nurse Anesthetist

## 2017-01-13 DIAGNOSIS — M1711 Unilateral primary osteoarthritis, right knee: Principal | ICD-10-CM | POA: Insufficient documentation

## 2017-01-13 DIAGNOSIS — Z8546 Personal history of malignant neoplasm of prostate: Secondary | ICD-10-CM | POA: Insufficient documentation

## 2017-01-13 DIAGNOSIS — G8929 Other chronic pain: Secondary | ICD-10-CM | POA: Diagnosis not present

## 2017-01-13 DIAGNOSIS — Z951 Presence of aortocoronary bypass graft: Secondary | ICD-10-CM | POA: Diagnosis not present

## 2017-01-13 DIAGNOSIS — M25561 Pain in right knee: Secondary | ICD-10-CM | POA: Diagnosis not present

## 2017-01-13 DIAGNOSIS — M545 Low back pain: Secondary | ICD-10-CM | POA: Diagnosis not present

## 2017-01-13 DIAGNOSIS — Z79899 Other long term (current) drug therapy: Secondary | ICD-10-CM | POA: Diagnosis not present

## 2017-01-13 DIAGNOSIS — I4891 Unspecified atrial fibrillation: Secondary | ICD-10-CM | POA: Insufficient documentation

## 2017-01-13 DIAGNOSIS — Z86718 Personal history of other venous thrombosis and embolism: Secondary | ICD-10-CM | POA: Diagnosis not present

## 2017-01-13 DIAGNOSIS — Z8673 Personal history of transient ischemic attack (TIA), and cerebral infarction without residual deficits: Secondary | ICD-10-CM | POA: Diagnosis not present

## 2017-01-13 DIAGNOSIS — I255 Ischemic cardiomyopathy: Secondary | ICD-10-CM | POA: Diagnosis not present

## 2017-01-13 DIAGNOSIS — I252 Old myocardial infarction: Secondary | ICD-10-CM | POA: Diagnosis not present

## 2017-01-13 DIAGNOSIS — E785 Hyperlipidemia, unspecified: Secondary | ICD-10-CM | POA: Diagnosis not present

## 2017-01-13 DIAGNOSIS — G8918 Other acute postprocedural pain: Secondary | ICD-10-CM | POA: Diagnosis not present

## 2017-01-13 DIAGNOSIS — Z7901 Long term (current) use of anticoagulants: Secondary | ICD-10-CM | POA: Insufficient documentation

## 2017-01-13 DIAGNOSIS — Z955 Presence of coronary angioplasty implant and graft: Secondary | ICD-10-CM | POA: Diagnosis not present

## 2017-01-13 DIAGNOSIS — I2510 Atherosclerotic heart disease of native coronary artery without angina pectoris: Secondary | ICD-10-CM | POA: Diagnosis not present

## 2017-01-13 DIAGNOSIS — M1710 Unilateral primary osteoarthritis, unspecified knee: Secondary | ICD-10-CM | POA: Diagnosis present

## 2017-01-13 DIAGNOSIS — M179 Osteoarthritis of knee, unspecified: Secondary | ICD-10-CM | POA: Diagnosis present

## 2017-01-13 DIAGNOSIS — E7800 Pure hypercholesterolemia, unspecified: Secondary | ICD-10-CM | POA: Diagnosis not present

## 2017-01-13 HISTORY — PX: PARTIAL KNEE ARTHROPLASTY: SHX2174

## 2017-01-13 LAB — TYPE AND SCREEN
ABO/RH(D): A POS
Antibody Screen: NEGATIVE

## 2017-01-13 SURGERY — ARTHROPLASTY, KNEE, UNICOMPARTMENTAL
Anesthesia: Spinal | Site: Knee | Laterality: Right

## 2017-01-13 MED ORDER — METHOCARBAMOL 500 MG PO TABS: 500.0000 mg | ORAL_TABLET | Freq: Four times a day (QID) | ORAL | Status: DC | PRN

## 2017-01-13 MED ORDER — BUPIVACAINE LIPOSOME 1.3 % IJ SUSP
INTRAMUSCULAR | Status: DC | PRN
  Administered 2017-01-13: 20 mL

## 2017-01-13 MED ORDER — CHLORHEXIDINE GLUCONATE 4 % EX LIQD: 60.0000 mL | Freq: Once | CUTANEOUS | Status: DC

## 2017-01-13 MED ORDER — VANCOMYCIN HCL IN DEXTROSE 1-5 GM/200ML-% IV SOLN
1000.0000 mg | INTRAVENOUS | Status: AC
  Administered 2017-01-13: 1000 mg via INTRAVENOUS

## 2017-01-13 MED ORDER — MIDAZOLAM HCL 2 MG/2ML IJ SOLN: 1.0000 mg | INTRAMUSCULAR | Status: DC | PRN

## 2017-01-13 MED ORDER — PRAVASTATIN SODIUM 40 MG PO TABS
40.0000 mg | ORAL_TABLET | Freq: Every day | ORAL | Status: DC
Start: 2017-01-13 — End: 2017-01-14
  Administered 2017-01-13: 40 mg via ORAL
  Filled 2017-01-13: qty 2
  Filled 2017-01-13: qty 1

## 2017-01-13 MED ORDER — ACETAMINOPHEN 10 MG/ML IV SOLN
1000.0000 mg | Freq: Once | INTRAVENOUS | Status: AC
  Administered 2017-01-13: 1000 mg via INTRAVENOUS

## 2017-01-13 MED ORDER — VANCOMYCIN HCL IN DEXTROSE 1-5 GM/200ML-% IV SOLN
1000.0000 mg | Freq: Two times a day (BID) | INTRAVENOUS | Status: AC
  Administered 2017-01-13: 21:00:00 1000 mg via INTRAVENOUS
  Filled 2017-01-13: qty 200

## 2017-01-13 MED ORDER — OXYCODONE HCL 5 MG PO TABS
5.0000 mg | ORAL_TABLET | ORAL | Status: DC | PRN
  Administered 2017-01-13: 5 mg via ORAL
  Filled 2017-01-13: qty 1

## 2017-01-13 MED ORDER — SODIUM CHLORIDE 0.9 % IV SOLN
INTRAVENOUS | Status: DC
  Administered 2017-01-13: 21:00:00 via INTRAVENOUS
  Administered 2017-01-13: 100 mL/h via INTRAVENOUS

## 2017-01-13 MED ORDER — HYDROMORPHONE HCL-NACL 0.5-0.9 MG/ML-% IV SOSY: 0.2500 mg | PREFILLED_SYRINGE | INTRAVENOUS | Status: DC | PRN

## 2017-01-13 MED ORDER — POLYETHYLENE GLYCOL 3350 17 G PO PACK: 17.0000 g | PACK | Freq: Every day | ORAL | Status: DC | PRN

## 2017-01-13 MED ORDER — 0.9 % SODIUM CHLORIDE (POUR BTL) OPTIME
TOPICAL | Status: DC | PRN
  Administered 2017-01-13: 1000 mL

## 2017-01-13 MED ORDER — ONDANSETRON HCL 4 MG/2ML IJ SOLN: 4.0000 mg | Freq: Four times a day (QID) | INTRAMUSCULAR | Status: DC | PRN

## 2017-01-13 MED ORDER — FLEET ENEMA 7-19 GM/118ML RE ENEM: 1.0000 | ENEMA | Freq: Once | RECTAL | Status: DC | PRN

## 2017-01-13 MED ORDER — FLUTICASONE FUROATE-VILANTEROL 100-25 MCG/INH IN AEPB
1.0000 | INHALATION_SPRAY | Freq: Every day | RESPIRATORY_TRACT | Status: DC | PRN
  Filled 2017-01-13: qty 28

## 2017-01-13 MED ORDER — PROPOFOL 10 MG/ML IV BOLUS
INTRAVENOUS | Status: AC
  Filled 2017-01-13: qty 40

## 2017-01-13 MED ORDER — BUPIVACAINE LIPOSOME 1.3 % IJ SUSP
20.0000 mL | Freq: Once | INTRAMUSCULAR | Status: DC
  Filled 2017-01-13: qty 20

## 2017-01-13 MED ORDER — TAMSULOSIN HCL 0.4 MG PO CAPS
0.4000 mg | ORAL_CAPSULE | Freq: Every evening | ORAL | Status: DC
  Administered 2017-01-13: 14:00:00 0.4 mg via ORAL
  Filled 2017-01-13: qty 1

## 2017-01-13 MED ORDER — LACTATED RINGERS IV SOLN
INTRAVENOUS | Status: DC
  Administered 2017-01-13: 1000 mL via INTRAVENOUS
  Administered 2017-01-13: 11:00:00 via INTRAVENOUS

## 2017-01-13 MED ORDER — PHENOL 1.4 % MT LIQD
1.0000 | OROMUCOSAL | Status: DC | PRN
  Filled 2017-01-13: qty 177

## 2017-01-13 MED ORDER — PHENYLEPHRINE HCL 10 MG/ML IJ SOLN
INTRAMUSCULAR | Status: DC | PRN
  Administered 2017-01-13 (×5): 80 ug via INTRAVENOUS

## 2017-01-13 MED ORDER — TRANEXAMIC ACID 1000 MG/10ML IV SOLN
2000.0000 mg | Freq: Once | INTRAVENOUS | Status: DC
  Filled 2017-01-13: qty 20

## 2017-01-13 MED ORDER — MIDAZOLAM HCL 2 MG/2ML IJ SOLN
INTRAMUSCULAR | Status: AC
  Filled 2017-01-13: qty 2

## 2017-01-13 MED ORDER — METOCLOPRAMIDE HCL 5 MG PO TABS: 5.0000 mg | ORAL_TABLET | Freq: Three times a day (TID) | ORAL | Status: DC | PRN

## 2017-01-13 MED ORDER — VANCOMYCIN HCL IN DEXTROSE 1-5 GM/200ML-% IV SOLN
INTRAVENOUS | Status: AC
  Administered 2017-01-13: 1000 mg via INTRAVENOUS
  Filled 2017-01-13: qty 200

## 2017-01-13 MED ORDER — OXYCODONE HCL 5 MG/5ML PO SOLN
5.0000 mg | Freq: Once | ORAL | Status: DC | PRN
  Filled 2017-01-13: qty 5

## 2017-01-13 MED ORDER — SODIUM CHLORIDE 0.9 % IJ SOLN
INTRAMUSCULAR | Status: DC | PRN
  Administered 2017-01-13: 30 mL

## 2017-01-13 MED ORDER — BUPIVACAINE IN DEXTROSE 0.75-8.25 % IT SOLN
INTRATHECAL | Status: DC | PRN
  Administered 2017-01-13: 2 mL via INTRATHECAL

## 2017-01-13 MED ORDER — DIPHENHYDRAMINE HCL 12.5 MG/5ML PO ELIX: 12.5000 mg | ORAL_SOLUTION | ORAL | Status: DC | PRN

## 2017-01-13 MED ORDER — APIXABAN 2.5 MG PO TABS
2.5000 mg | ORAL_TABLET | Freq: Two times a day (BID) | ORAL | Status: DC
  Administered 2017-01-14: 2.5 mg via ORAL
  Filled 2017-01-13: qty 1

## 2017-01-13 MED ORDER — TRAMADOL HCL 50 MG PO TABS: 50.0000 mg | ORAL_TABLET | Freq: Four times a day (QID) | ORAL | Status: DC | PRN

## 2017-01-13 MED ORDER — DEXAMETHASONE SODIUM PHOSPHATE 10 MG/ML IJ SOLN
10.0000 mg | Freq: Once | INTRAMUSCULAR | Status: AC
  Administered 2017-01-13: 10 mg via INTRAVENOUS

## 2017-01-13 MED ORDER — RAMIPRIL 2.5 MG PO CAPS
2.5000 mg | ORAL_CAPSULE | Freq: Every day | ORAL | Status: DC
  Filled 2017-01-13: qty 1

## 2017-01-13 MED ORDER — DEXAMETHASONE SODIUM PHOSPHATE 10 MG/ML IJ SOLN
10.0000 mg | Freq: Once | INTRAMUSCULAR | Status: AC
  Administered 2017-01-14: 10 mg via INTRAVENOUS
  Filled 2017-01-13: qty 1

## 2017-01-13 MED ORDER — PROMETHAZINE HCL 25 MG/ML IJ SOLN: 6.2500 mg | INTRAMUSCULAR | Status: DC | PRN

## 2017-01-13 MED ORDER — ONDANSETRON HCL 4 MG PO TABS: 4.0000 mg | ORAL_TABLET | Freq: Four times a day (QID) | ORAL | Status: DC | PRN

## 2017-01-13 MED ORDER — DOCUSATE SODIUM 100 MG PO CAPS
100.0000 mg | ORAL_CAPSULE | Freq: Two times a day (BID) | ORAL | Status: DC
  Administered 2017-01-13 – 2017-01-14 (×2): 100 mg via ORAL
  Filled 2017-01-13 (×2): qty 1

## 2017-01-13 MED ORDER — EPHEDRINE SULFATE 50 MG/ML IJ SOLN
INTRAMUSCULAR | Status: DC | PRN
  Administered 2017-01-13 (×2): 10 mg via INTRAVENOUS

## 2017-01-13 MED ORDER — BUPIVACAINE HCL (PF) 0.25 % IJ SOLN
INTRAMUSCULAR | Status: AC
  Filled 2017-01-13: qty 30

## 2017-01-13 MED ORDER — DEXTROSE 5 % IV SOLN
500.0000 mg | Freq: Four times a day (QID) | INTRAVENOUS | Status: DC | PRN
  Administered 2017-01-14: 500 mg via INTRAVENOUS
  Filled 2017-01-13: qty 550

## 2017-01-13 MED ORDER — LORATADINE 10 MG PO TABS
10.0000 mg | ORAL_TABLET | Freq: Every day | ORAL | Status: DC
  Administered 2017-01-14: 09:00:00 10 mg via ORAL
  Filled 2017-01-13: qty 1

## 2017-01-13 MED ORDER — FENTANYL CITRATE (PF) 100 MCG/2ML IJ SOLN: 50.0000 ug | INTRAMUSCULAR | Status: DC | PRN

## 2017-01-13 MED ORDER — MENTHOL 3 MG MT LOZG: 1.0000 | LOZENGE | OROMUCOSAL | Status: DC | PRN

## 2017-01-13 MED ORDER — SODIUM CHLORIDE 0.9 % IJ SOLN
INTRAMUSCULAR | Status: AC
  Filled 2017-01-13: qty 50

## 2017-01-13 MED ORDER — OXYCODONE HCL 5 MG PO TABS: 5.0000 mg | ORAL_TABLET | Freq: Once | ORAL | Status: DC | PRN

## 2017-01-13 MED ORDER — FENTANYL CITRATE (PF) 100 MCG/2ML IJ SOLN
INTRAMUSCULAR | Status: AC
  Filled 2017-01-13: qty 2

## 2017-01-13 MED ORDER — METOCLOPRAMIDE HCL 5 MG/ML IJ SOLN: 5.0000 mg | Freq: Three times a day (TID) | INTRAMUSCULAR | Status: DC | PRN

## 2017-01-13 MED ORDER — ROPIVACAINE HCL 5 MG/ML IJ SOLN
INTRAMUSCULAR | Status: DC | PRN
  Administered 2017-01-13: 20 mL via PERINEURAL

## 2017-01-13 MED ORDER — BISACODYL 10 MG RE SUPP: 10.0000 mg | Freq: Every day | RECTAL | Status: DC | PRN

## 2017-01-13 MED ORDER — PROPOFOL 500 MG/50ML IV EMUL
INTRAVENOUS | Status: DC | PRN
  Administered 2017-01-13: 50 ug/kg/min via INTRAVENOUS

## 2017-01-13 MED ORDER — STERILE WATER FOR IRRIGATION IR SOLN
Status: DC | PRN
  Administered 2017-01-13: 2000 mL

## 2017-01-13 MED ORDER — ALBUTEROL SULFATE (2.5 MG/3ML) 0.083% IN NEBU: 3.0000 mL | INHALATION_SOLUTION | Freq: Four times a day (QID) | RESPIRATORY_TRACT | Status: DC | PRN

## 2017-01-13 MED ORDER — TRANEXAMIC ACID 1000 MG/10ML IV SOLN
INTRAVENOUS | Status: AC | PRN
  Administered 2017-01-13: 2000 mg via TOPICAL

## 2017-01-13 MED ORDER — ACETAMINOPHEN 10 MG/ML IV SOLN
INTRAVENOUS | Status: AC
  Filled 2017-01-13: qty 100

## 2017-01-13 MED ORDER — ACETAMINOPHEN 500 MG PO TABS
1000.0000 mg | ORAL_TABLET | Freq: Four times a day (QID) | ORAL | Status: DC
  Administered 2017-01-13 – 2017-01-14 (×3): 1000 mg via ORAL
  Filled 2017-01-13 (×3): qty 2

## 2017-01-13 MED ORDER — LATANOPROST 0.005 % OP SOLN
1.0000 [drp] | Freq: Every day | OPHTHALMIC | Status: DC
  Administered 2017-01-13: 1 [drp] via OPHTHALMIC
  Filled 2017-01-13: qty 2.5

## 2017-01-13 MED ORDER — SODIUM CHLORIDE 0.9 % IR SOLN
Status: DC | PRN
Start: 2017-01-13 — End: 2017-01-13
  Administered 2017-01-13: 1000 mL

## 2017-01-13 SURGICAL SUPPLY — 47 items
BAG DECANTER FOR FLEXI CONT (MISCELLANEOUS) ×3 IMPLANT
BAG SPEC THK2 15X12 ZIP CLS (MISCELLANEOUS) ×1
BAG ZIPLOCK 12X15 (MISCELLANEOUS) ×2 IMPLANT
BANDAGE ACE 6X5 VEL STRL LF (GAUZE/BANDAGES/DRESSINGS) ×3 IMPLANT
BLADE SAW RECIPROCATING 77.5 (BLADE) ×3 IMPLANT
BLADE SAW SGTL 13.0X1.19X90.0M (BLADE) ×3 IMPLANT
BOWL SMART MIX CTS (DISPOSABLE) ×3 IMPLANT
BUR OVAL CARBIDE 4.0 (BURR) ×3 IMPLANT
CAPT KNEE PARTIAL 2 ×3 IMPLANT
CEMENT HV SMART SET (Cement) ×3 IMPLANT
CLOSURE WOUND 1/2 X4 (GAUZE/BANDAGES/DRESSINGS) ×2
CLOTH BEACON ORANGE TIMEOUT ST (SAFETY) IMPLANT
COVER SURGICAL LIGHT HANDLE (MISCELLANEOUS) ×3 IMPLANT
CUFF TOURN SGL QUICK 34 (TOURNIQUET CUFF) ×3
CUFF TRNQT CYL 34X4X40X1 (TOURNIQUET CUFF) ×1 IMPLANT
DRSG ADAPTIC 3X8 NADH LF (GAUZE/BANDAGES/DRESSINGS) ×3 IMPLANT
DRSG PAD ABDOMINAL 8X10 ST (GAUZE/BANDAGES/DRESSINGS) ×3 IMPLANT
DURAPREP 26ML APPLICATOR (WOUND CARE) ×3 IMPLANT
ELECT REM PT RETURN 15FT ADLT (MISCELLANEOUS) ×3 IMPLANT
EVACUATOR 1/8 PVC DRAIN (DRAIN) ×3 IMPLANT
GAUZE SPONGE 4X4 12PLY STRL (GAUZE/BANDAGES/DRESSINGS) ×3 IMPLANT
GLOVE BIO SURGEON STRL SZ 6 (GLOVE) ×3 IMPLANT
GLOVE BIO SURGEON STRL SZ7.5 (GLOVE) ×3 IMPLANT
GLOVE BIO SURGEON STRL SZ8 (GLOVE) ×3 IMPLANT
GLOVE BIOGEL PI IND STRL 6.5 (GLOVE) ×1 IMPLANT
GLOVE BIOGEL PI IND STRL 8 (GLOVE) ×2 IMPLANT
GLOVE BIOGEL PI INDICATOR 6.5 (GLOVE) ×2
GLOVE BIOGEL PI INDICATOR 8 (GLOVE) ×4
GOWN STRL REUS W/TWL LRG LVL3 (GOWN DISPOSABLE) ×6 IMPLANT
GOWN STRL REUS W/TWL XL LVL3 (GOWN DISPOSABLE) ×3 IMPLANT
HANDPIECE INTERPULSE COAX TIP (DISPOSABLE) ×3
IMMOBILIZER KNEE 20 (SOFTGOODS) ×3
IMMOBILIZER KNEE 20 THIGH 36 (SOFTGOODS) ×1 IMPLANT
KIT IMPL STRL TIB IPOLY IUNI IMPLANT
MANIFOLD NEPTUNE II (INSTRUMENTS) ×3 IMPLANT
PACK TOTAL KNEE CUSTOM (KITS) ×3 IMPLANT
PADDING CAST COTTON 6X4 STRL (CAST SUPPLIES) ×6 IMPLANT
POSITIONER SURGICAL ARM (MISCELLANEOUS) ×3 IMPLANT
SET HNDPC FAN SPRY TIP SCT (DISPOSABLE) ×1 IMPLANT
STRIP CLOSURE SKIN 1/2X4 (GAUZE/BANDAGES/DRESSINGS) ×4 IMPLANT
SUT MNCRL AB 4-0 PS2 18 (SUTURE) ×3 IMPLANT
SUT STRATAFIX 0 PDS 27 VIOLET (SUTURE) ×3
SUT VIC AB 2-0 CT1 27 (SUTURE) ×6
SUT VIC AB 2-0 CT1 TAPERPNT 27 (SUTURE) ×2 IMPLANT
SUTURE STRATFX 0 PDS 27 VIOLET (SUTURE) ×1 IMPLANT
SYR 50ML LL SCALE MARK (SYRINGE) ×3 IMPLANT
WRAP KNEE MAXI GEL POST OP (GAUZE/BANDAGES/DRESSINGS) ×2 IMPLANT

## 2017-01-13 NOTE — Evaluation (Signed)
Physical Therapy Evaluation Patient Details Name: Donald [name] MRN: [id number] DOB: [birth year] Today's Date: 01/13/2017   History of Present Illness  Pt s/p R UKR and with hx of TIA, CAD, CABG and ischemic cardiomyopathy  Clinical Impression  Pt s/p R UKR and presents with decreased R LE strength/ROM and post op pain limiting functional mobility.  Pt should progress to dc home with family assist.    Follow Up Recommendations DC plan and follow up therapy as arranged by surgeon    Equipment Recommendations  None recommended by PT    Recommendations for Other Services OT consult     Precautions / Restrictions Precautions Precautions: Knee;Fall Required Braces or Orthoses: Knee Immobilizer - Right Knee Immobilizer - Right: Discontinue once straight leg raise with < 10 degree lag Restrictions Weight Bearing Restrictions: No      Mobility  Bed Mobility Overal bed mobility: Needs Assistance Bed Mobility: Supine to Sit     Supine to sit: Min guard     General bed mobility comments: cues for sequence and use of L LE to self assist  Transfers Overall transfer level: Needs assistance Equipment used: Rolling walker (2 wheeled) Transfers: Sit to/from Stand Sit to Stand: Min assist         General transfer comment: cue for LE management and use of UEs to self assist  Ambulation/Gait Ambulation/Gait assistance: Min assist Ambulation Distance (Feet): 50 Feet Assistive device: Rolling walker (2 wheeled) Gait Pattern/deviations: Step-to pattern;Decreased step length - right;Decreased step length - left;Shuffle;Trunk flexed Gait velocity: decr Gait velocity interpretation: Below normal speed for age/gender General Gait Details: cues for sequence, posture and position from [employer]            Wheelchair Mobility    Modified Rankin (Stroke Patients Only)       Balance                                             Pertinent Vitals/Pain  Pain Assessment: 0-10 Pain Score: 3  Pain Location: R knee Pain Descriptors / Indicators: Aching;Sore Pain Intervention(s): Limited activity within patient's tolerance;Monitored during session;Premedicated before session;Ice applied    Home Living Family/patient expects to be discharged to:: Private residence Living Arrangements: Alone (Pt states dtr will stay with him initially) Available Help at Discharge: Family Type of Home: House Home Access: Ramped entrance     Home Layout: Able to live on main level with bedroom/bathroom Home Equipment: Walker - 2 wheels;Cane - single point Additional Comments: Pt reports home is very handicapped accessible - was set up for late wife who was an invalid    Prior Function Level of Independence: Independent         Comments: Works as Psychologist, 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     [employer]        Extremity/Trunk Assessment   Upper Extremity Assessment Upper Extremity Assessment: Overall WFL for tasks assessed    Lower Extremity Assessment Lower Extremity Assessment: RLE deficits/detail    Cervical / Trunk Assessment Cervical / Trunk Assessment: Normal  Communication   Communication: No difficulties  Cognition Arousal/Alertness: Awake/alert Behavior During Therapy: WFL for tasks assessed/performed Overall Cognitive Status: Within Functional Limits for tasks assessed  General Comments      Exercises Total Joint Exercises Ankle Circles/Pumps: AROM;Both;15 reps;Supine   Assessment/Plan    PT Assessment Patient needs continued PT services  PT Problem List Decreased strength;Decreased range of motion;Decreased activity tolerance;Decreased mobility;Decreased knowledge of use of DME;Pain       PT Treatment Interventions DME instruction;Gait training;Stair training;Functional mobility training;Therapeutic activities;Therapeutic exercise;Patient/family education    PT Goals (Current goals  can be found in the Care Plan section)  Acute Rehab PT Goals Patient Stated Goal: Regain IND PT Goal Formulation: With patient Time For Goal Achievement: 01/16/17 Potential to Achieve Goals: Good    Frequency 7X/week   Barriers to discharge        Co-evaluation               AM-PAC PT "6 Clicks" Daily Activity  Outcome Measure Difficulty turning over in bed (including adjusting bedclothes, sheets and blankets)?: A Lot Difficulty moving from lying on back to sitting on the side of the bed? : A Lot Difficulty sitting down on and standing up from a chair with arms (e.g., wheelchair, bedside commode, etc,.)?: A Lot Help needed moving to and from a bed to chair (including a wheelchair)?: A Little Help needed walking in hospital room?: A Little Help needed climbing 3-5 steps with a railing? : A Little 6 Click Score: 15    End of Session Equipment Utilized During Treatment: Gait belt;Right knee immobilizer Activity Tolerance: Patient tolerated treatment well Patient left: in chair;with call bell/phone within reach;with chair alarm set;with family/visitor present Nurse Communication: Mobility status PT Visit Diagnosis: Difficulty in walking, not elsewhere classified (R26.2)    Time: 1523-1600 PT Time Calculation (min) (ACUTE ONLY): 37 min   Charges:   PT Evaluation $PT Eval Low Complexity: 1 Low PT Treatments $Gait Training: 8-22 mins   PT G Codes:   PT G-Codes **NOT FOR INPATIENT CLASS** Functional Assessment Tool Used: Clinical judgement Functional Limitation: Mobility: Walking and moving around Mobility: Walking and Moving Around Current Status (A5409): At least 20 percent but less than 40 percent impaired, limited or restricted Mobility: Walking and Moving Around Goal Status [phone]): At least 1 percent but less than 20 percent impaired, limited or restricted    Pg [phone]   Alvera Tourigny 01/13/2017, 5:32 PM

## 2017-01-13 NOTE — H&P (View-Only) (Signed)
Donald [name] DOB: 01/27/1927 Married / Language: English / Race: White Male Date of Admission:  01/13/2017 CC:  Right knee pain History of Present Illness The patient is a 83 year old male who comes in  for a preoperative History and Physical. The patient is scheduled for a right unicompartmental knee arthroplasty to be performed by Dr. Dione Plover. Aluisio, MD at [facility] on 01/13/2017. The patient is a 83 year old male who was seen in the office as a surgical consult. The patient reported right knee symptoms including: pain, instability, stiffness, soreness and grinding which began year(s) ago without any known injury.The patient felt that the symptoms were getting worsening. The patient has the current diagnosis of knee osteoarthritis. Prior to being seen fro consideration of surgery, the patient was previously evaluated in this clinic (by Dr. Gladstone Lighter). Previous work-up for this problem has included knee x-rays and arthroscopy (07/10/15). Past treatment for this problem has included intra-articular injection of corticosteroids (as well as Euflexxa series). Donald [name] has complaints of significant RIGHT medial knee pain. The pain is isolated to the medial side of the knee. It is hurting with most activities. It is limiting what he can and cannot do. He is at a stage we would like to remain active but the knee is preventing him from doing so. He has undergone cortisone and Euflexxa injections without much benefit. It is felt that he would benefit from undergoing a partial knee replacement. They have been treated conservatively in the past for the above stated problem and despite conservative measures, they continue to have progressive pain and severe functional limitations and dysfunction. They have failed non-operative management including home exercise, medications, and injections. It is felt that they would benefit from undergoing unicompartmental knee replacement. Risks and benefits of  the procedure have been discussed with the patient and they elect to proceed with surgery. There are no active contraindications to surgery such as ongoing infection or rapidly progressive neurological disease.  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 age 94 Mother  Deceased. age 59  Social History  Alcohol use  current drinker; drinks beer and wine; only occasionally per week Children  3 Current work status  retired Engineer, agricultural (Currently)  no Drug/Alcohol Rehab (Previously)  no Exercise  Exercises daily; does running / walking Illicit drug use  no Living situation  live with spouse Marital status  married Number of flights of stairs before winded  2-3 Pain Contract  no Tobacco use  Never smoker.  Medication History Eliquis (5MG  Tablet, Oral) Active. Pravastatin Sodium (40MG  Tablet, Oral) Active. Fenofibrate Micronized (200MG  Capsule, 1 Oral daily) Active. Ramipril (2.5MG  Capsule, 2 Oral daily) Active. Tamsulosin HCl (0.4MG  Capsule, 1 Oral daily) Active. Multivitamins (1 Oral daily) Active. Vitamin D3 (2000UNIT Capsule, 1 Oral daily) Active. Xalatan (0.005% Solution, 1 Ophthalmic daily) Active.  Past Surgical History Arthroscopic Knee Surgery - Right [07/10/2015]: Cataract Surgery  bilateral Coronary Artery Bypass Graft  3 vessels Heart Stents   Review of Systems General  Not Present- Chills, Fatigue, Fever,  Memory Loss, Night Sweats, Weight Gain and Weight Loss. Skin Not Present- Eczema, Hives, Itching, Lesions and Rash. HEENT Not Present- Dentures, Double Vision, Headache, Hearing Loss, Tinnitus and Visual Loss. Respiratory Not Present- Allergies, Chronic Cough, Coughing up blood, Shortness of breath at rest and Shortness of breath with exertion. Cardiovascular Not Present- Chest Pain, Difficulty Breathing Lying Down, Murmur, Palpitations, Racing/skipping heartbeats and Swelling. Gastrointestinal Not Present- Abdominal Pain, Bloody Stool, Constipation, Diarrhea, Difficulty Swallowing, Heartburn, Jaundice, Loss of appetitie, Nausea and Vomiting. Male Genitourinary Not Present- Blood in Urine, Discharge, Flank Pain, Incontinence, Painful Urination, Urgency, Urinary frequency, Urinary Retention, Urinating at Night and Weak urinary stream. Musculoskeletal Present- Joint Pain. Not Present- Back Pain, Joint Swelling, Morning Stiffness, Muscle Pain, Muscle Weakness and Spasms. Neurological Not Present- Blackout spells, Difficulty with balance, Dizziness, Paralysis, Tremor and Weakness. Psychiatric Not Present- Insomnia.  Vitals  Weight: 180 lb Height: 69in Weight was reported by patient. Height was reported by patient. Body Surface Area: 1.98 m Body Mass Index: 26.58 kg/m  Pulse: 68 (Regular)  Resp.: 16 (Unlabored)  BP: 104/64 (Sitting, Right Arm, Standard)    Physical Exam  General Mental Status -Alert, cooperative and good historian. General Appearance-pleasant, Not in acute distress. Orientation-Oriented X3. Build & Nutrition-Well nourished and Well developed.  Head and Neck Head-normocephalic, atraumatic . Neck Global Assessment - supple, no bruit auscultated on the right, no bruit auscultated on the left.  Eye Vision-Wears corrective lenses. Pupil - Bilateral-Regular and Round. Motion - Bilateral-EOMI.  Chest and Lung Exam Auscultation Breath sounds -  clear at anterior chest wall and clear at posterior chest wall. Adventitious sounds - No Adventitious sounds.  Cardiovascular Auscultation Rhythm - Regular rate and rhythm. Heart Sounds - S1 WNL and S2 WNL. Murmurs & Other Heart Sounds - Auscultation of the heart reveals - No Murmurs.  Abdomen Palpation/Percussion Tenderness - Abdomen is non-tender to palpation. Rigidity (guarding) - Abdomen is soft. Auscultation Auscultation of the abdomen reveals - Bowel sounds normal.  Male Genitourinary Note: Not done, not pertinent to present illness   Musculoskeletal Note: Evaluation of the left hip shows flexion to 120 rotation in 30 out 40 and abduction 40 without discomfort. There is no tenderness over the greater trochanter. There is no pain on provocative testing of the hip.Examination of the right hip shows flexion to 120 rotation in 30 abduction 40 and external rotation of 40. There is no tenderness over the greater trochanter. There is no pain on provocative testing of the hip.Left knee shows no effusion. Range of motion 0-125. No medial or lateral joint line tenderness. No instability. No crepitus on range of motion. his RIGHT knee shows no effusion. His range of motion is 0-125. He is tender along the medial aspect of the RIGHT knee. There is no lateral tenderness noted. There is no instability of the knee. Gait pattern is slightly antalgic on the RIGHT. Pulse sensation and motor are intact distally.  Radiographs-AP and lateral of the knees show significant medial joint space narrowing with bone-on-bone change. He has no patellofemoral or lateral involvement.   Assessment & Plan  Primary osteoarthritis of right knee (M17.11)  Note:Surgical Plans: Right Unicompartmental Knee Replacement  Disposition: Home  PCP: Dr. Osborne Casco Crads: Dr. [name]  Topical TXA - Prostate Cancer  Anesthesia Issues: None  Patient was instructed on what medications to stop prior to  surgery.  Signed electronically by Joelene Millin, III PA-C

## 2017-01-13 NOTE — Anesthesia Preprocedure Evaluation (Signed)
Anesthesia Evaluation  Patient identified by MRN, date of birth, ID band Patient awake    Reviewed: Allergy & Precautions, H&P , NPO status , Patient's Chart, lab work & pertinent test results  Airway Mallampati: II  TM Distance: >3 FB Neck ROM: full    Dental no notable dental hx. (+) Teeth Intact, Dental Advisory Given   Pulmonary neg pulmonary ROS, asthma ,    Pulmonary exam normal breath sounds clear to auscultation       Cardiovascular + CAD, + Past MI, + Cardiac Stents and + CABG  Normal cardiovascular exam+ dysrhythmias Atrial Fibrillation  Rhythm:regular Rate:Normal  Ischemic cardiomyopathy   Neuro/Psych Glaucoma. CVA 2012 TIACVA, No Residual Symptoms negative psych ROS   GI/Hepatic negative GI ROS, Neg liver ROS,   Endo/Other  negative endocrine ROS  Renal/GU negative Renal ROS  negative genitourinary   Musculoskeletal  (+) Arthritis , Osteoarthritis,    Abdominal   Peds  Hematology negative hematology ROS (+)   Anesthesia Other Findings   Reproductive/Obstetrics negative OB ROS                             Anesthesia Physical  Anesthesia Plan  ASA: III  Anesthesia Plan: Spinal   Post-op Pain Management:  Regional for Post-op pain   Induction: Intravenous  PONV Risk Score and Plan: 1 and Ondansetron  Airway Management Planned: Simple Face Mask  Additional Equipment:   Intra-op Plan:   Post-operative Plan:   Informed Consent: I have reviewed the patients History and Physical, chart, labs and discussed the procedure including the risks, benefits and alternatives for the proposed anesthesia with the patient or authorized representative who has indicated his/her understanding and acceptance.   Dental Advisory Given  Plan Discussed with: CRNA and Surgeon  Anesthesia Plan Comments:         Anesthesia Quick Evaluation

## 2017-01-13 NOTE — Anesthesia Procedure Notes (Signed)
Spinal  Start time: 01/13/2017 9:59 AM End time: 01/13/2017 10:04 AM Staffing Anesthesiologist: Candida Peeling RAY Performed: anesthesiologist  Preanesthetic Checklist Completed: patient identified, site marked, surgical consent, pre-op evaluation, timeout performed, IV checked, risks and benefits discussed and monitors and equipment checked Spinal Block Patient position: sitting Prep: ChloraPrep and site prepped and draped Patient monitoring: heart rate, cardiac monitor, continuous pulse ox and blood pressure Approach: midline Location: L3-4 Injection technique: single-shot Needle Needle type: Tuohy  Needle gauge: 22 G Needle length: 9 cm

## 2017-01-13 NOTE — Anesthesia Procedure Notes (Signed)
Anesthesia Regional Block: Adductor canal block   Pre-Anesthetic Checklist: ,, timeout performed, Correct Patient, Correct Site, Correct Laterality, Correct Procedure, Correct Position, site marked, Risks and benefits discussed,  Surgical consent,  Pre-op evaluation,  At surgeon's request and post-op pain management  Laterality: Right  Prep: chloraprep       Needles:  Injection technique: Single-shot  Needle Type: Stimiplex     Needle Length: 9cm  Needle Gauge: 21     Additional Needles:   Procedures:,,,, ultrasound used (permanent image in chart),,,,  Narrative:  Start time: 01/13/2017 9:40 AM End time: 01/13/2017 9:45 AM Injection made incrementally with aspirations every 5 mL.  Performed by: Personally  Anesthesiologist: Candida Peeling RAY

## 2017-01-13 NOTE — Transfer of Care (Signed)
Immediate Anesthesia Transfer of Care Note  Patient: Donald [name]  Procedure(s) Performed: UNICOMPARTMENTAL RIGHT KNEE (Right Knee)  Patient Location: PACU  Anesthesia Type:Spinal and MAC combined with regional for post-op pain  Level of Consciousness: awake, alert , oriented and patient cooperative  Airway & Oxygen Therapy: Patient Spontanous Breathing and Patient connected to face mask oxygen  Post-op Assessment: Report given to RN and Post -op Vital signs reviewed and stable  Post vital signs: Reviewed and stable  Last Vitals:  Vitals:   01/13/17 0908 01/13/17 0909  BP: 113/69   Pulse: (!) 58 63  Resp: 11 11  Temp:    SpO2: 99% 100%    Last Pain:  Vitals:   01/13/17 0830  TempSrc: Oral      Patients Stated Pain Goal: 4 (83/38/25 0539)  Complications: No apparent anesthesia complications

## 2017-01-13 NOTE — Anesthesia Postprocedure Evaluation (Signed)
Anesthesia Post Note  Patient: Donald [name]  Procedure(s) Performed: UNICOMPARTMENTAL RIGHT KNEE (Right Knee)     Patient location during evaluation: PACU Anesthesia Type: Spinal Level of consciousness: oriented and awake and alert Pain management: pain level controlled Vital Signs Assessment: post-procedure vital signs reviewed and stable Respiratory status: spontaneous breathing and respiratory function stable Cardiovascular status: blood pressure returned to baseline and stable Postop Assessment: no headache, no backache and no apparent nausea or vomiting Anesthetic complications: no    Last Vitals:  Vitals:   01/13/17 1245 01/13/17 1300  BP: (!) 102/59 108/63  Pulse: 63 65  Resp: 15 20  Temp: (!) 36.2 C (!) 36.2 C  SpO2: 100% 100%    Last Pain:  Vitals:   01/13/17 1300  TempSrc:   PainSc: 0-No pain                 Lynda Rainwater

## 2017-01-13 NOTE — Interval H&P Note (Signed)
History and Physical Interval Note:  01/13/2017 8:22 AM  Donald [name]  has presented today for surgery, with the diagnosis of Medial compartment osteoarthritis right knee  The various methods of treatment have been discussed with the patient and family. After consideration of risks, benefits and other options for treatment, the patient has consented to  Procedure(s): UNICOMPARTMENTAL RIGHT KNEE (Right) as a surgical intervention .  The patient's history has been reviewed, patient examined, no change in status, stable for surgery.  I have reviewed the patient's chart and labs.  Questions were answered to the patient's satisfaction.     Gearlean Alf

## 2017-01-13 NOTE — Op Note (Signed)
OPERATIVE REPORT-UNICOMPARTMENTAL ARTHROPLASTY  PREOPERATIVE DIAGNOSIS: Medial compartment osteoarthritis, Right knee  POSTOPERATIVE DIAGNOSIS: Medial compartment osteoarthritis, Right knee  PROCEDURE:Right knee medial unicompartmental arthroplasty.   SURGEON: Gaynelle Arabian, MD   ASSISTANT: Arlee Muslim, PA-C  ANESTHESIA:  Adductor canal block and spinal.   ESTIMATED BLOOD LOSS: Minimal.   DRAINS: Hemovac x1.   TOURNIQUET TIME:   Total Tourniquet Time Documented: Thigh (Right) - 29 minutes Total: Thigh (Right) - 29 minutes    COMPLICATIONS: None.   CONDITION: Stable to recovery.   BRIEF CLINICAL NOTE:Donald [name] is a 83 y.o. male, who has  significant isolated medial compartment arthritis of the Right knee. The patient has had nonoperative management including injections of cortisone and viscous supplements. Unfortunately, the pain persists.  Radiograph showed isolated medial compartment bone-on-bone arthritis  with normal-appearing patellofemoral and lateral compartments. The patient presents now for left knee unicompartmental arthroplasty.   PROCEDURE IN DETAIL: After successful administration of  Adductor canal block and spinal anesthetic, a tourniquet was placed high on the  Right thigh and the Right lower extremity prepped and draped in usual sterile fashion. Extremity was wrapped in an Esmarch, knee flexed, and tourniquet inflated to 300 mmHg.       A midline incision was made with a 10 blade through subcutaneous  tissue to the extensor mechanism. A fresh blade was used to make a  medial parapatellar arthrotomy. Soft tissue on the proximal medial  tibia subperiosteally elevated to the joint line with a knife and into  the semimembranosus bursa with a Cobb elevator. The patella was  subluxed laterally, and the knee flexed 90 degrees. The ACL was intact.  The marginal osteophytes on the medial femur and tibia were removed with  a rongeur. The medial  meniscus was also removed. The femoral cutting  block for the conformis unicompartmental knee system was placed along  the femur. There was excellent fit. I traced the outline. We then  removed any remaining cartilage within this outline. We then placed the  cutting block again and pinned in position. The posterior femoral cut  was made, it was approximately 5 mm. The lug holes for the femoral  component were then drilled through the cutting block. The cutting  block was subsequently removed. We then utilized the high speed burr to  create a small trough at the superior aspect of the component tomake it inset and would not overhang the cartilage. The trial was placed,  it had excellent fit. The trial was subsequently removed.       The trial was placed again and the B chip was placed. There was  excellent balance throughout full motion. Also with excellent fit on  the tibia. This was removed as was the femoral trial. A curette was  used to remove any remaining cartilage from the tibia. The tibial  cutting block was then placed and there was a perfect fit on the tibial  surface. The appropriate slope was placed and it was pinned in  position. The reciprocating saw was used to make the central cut and  then the oscillating saw used to make the horizontal cut. The bone  fragment was then removed. The tibial trial was placed and had perfect  fit on the tibia. We then drilled the 2 lug holes and did the keel punch.  We then placed tibia trial and femur trial, and a 6 mm trial insert. There was  excellent stability throughout full range of motion and no impingement.  The trial  was then removed. We drilled small holes in the distal  femur in order to create more conduits for the cement. The cut bone  surfaces were thoroughly irrigated with pulsatile lavage while the  cement was mixed on the back table. We then cemented the tibial  component into place, impacted it and removed the extruded cement.  The  same was done for the femoral component. Trial 6-mm inserts placed,  knee held in full extension, and all extruded cement removed. While the  cement was hardening, I injected the extensor mechanism, periosteum of  the femur and subcu tissues, a total of 20 mL of Exparel mixed with 30  mL of saline and then did an additional injection of 20 mL of 0.25%  Marcaine into the same tissues. When the cement had fully hardened,  then the permanent polyethylene was placed in tibial tray. There was  excellent stability throughout full range of motion with no lift off the  component and no evidence of any impingement.       Wound was copiously irrigated with saline solution, and the arthrotomy closed over a Hemovac drain with a running #1 V-Loc suture. The subcutaneous was closed with  interrupted 2-0 Vicryl and subcuticular running 4-0 Monocryl. The drain  was hooked to suction. Incision cleaned and dried and Steri-Strips and  a bulky sterile dressing applied. The tourniquet was released after a  total time of 29 minutes. This was done after closing the extensor  mechanism. The wound was closed and a bulky sterile dressing was  applied. The operative limb was placed into a knee immobilizer, and the patient awakened and transported to recovery room in stable condition.       Please note that a surgical assistant was a medical necessity for this  procedure in order to perform it in a safe and expeditious manner.  Assistance was necessary for retracting vital ligaments, neurovascular  structures, as well as for proper positioning of the limb to allow for  appropriate bone cuts and appropriate placement of the prosthesis.    Donald Plover Mry Lamia, MD

## 2017-01-14 DIAGNOSIS — I2510 Atherosclerotic heart disease of native coronary artery without angina pectoris: Secondary | ICD-10-CM | POA: Diagnosis not present

## 2017-01-14 DIAGNOSIS — Z8546 Personal history of malignant neoplasm of prostate: Secondary | ICD-10-CM | POA: Diagnosis not present

## 2017-01-14 DIAGNOSIS — Z86718 Personal history of other venous thrombosis and embolism: Secondary | ICD-10-CM | POA: Diagnosis not present

## 2017-01-14 DIAGNOSIS — I255 Ischemic cardiomyopathy: Secondary | ICD-10-CM | POA: Diagnosis not present

## 2017-01-14 DIAGNOSIS — M1711 Unilateral primary osteoarthritis, right knee: Secondary | ICD-10-CM | POA: Diagnosis not present

## 2017-01-14 DIAGNOSIS — Z8673 Personal history of transient ischemic attack (TIA), and cerebral infarction without residual deficits: Secondary | ICD-10-CM | POA: Diagnosis not present

## 2017-01-14 LAB — CBC
HEMATOCRIT: 34.9 % — AB (ref 39.0–52.0)
Hemoglobin: 11.6 g/dL — ABNORMAL LOW (ref 13.0–17.0)
MCH: 32.1 pg (ref 26.0–34.0)
MCHC: 33.2 g/dL (ref 30.0–36.0)
MCV: 96.7 fL (ref 78.0–100.0)
Platelets: 221 10*3/uL (ref 150–400)
RBC: 3.61 MIL/uL — AB (ref 4.22–5.81)
RDW: 14 % (ref 11.5–15.5)
WBC: 11 10*3/uL — ABNORMAL HIGH (ref 4.0–10.5)

## 2017-01-14 LAB — BASIC METABOLIC PANEL
Anion gap: 6 (ref 5–15)
BUN: 22 mg/dL — AB (ref 6–20)
CALCIUM: 8.7 mg/dL — AB (ref 8.9–10.3)
CHLORIDE: 110 mmol/L (ref 101–111)
CO2: 23 mmol/L (ref 22–32)
CREATININE: 0.79 mg/dL (ref 0.61–1.24)
GFR calc non Af Amer: >60 mL/min (ref >60)
Glucose, Bld: 130 mg/dL — ABNORMAL HIGH (ref 65–99)
POTASSIUM: 3.8 mmol/L (ref 3.5–5.1)
SODIUM: 139 mmol/L (ref 135–145)

## 2017-01-14 LAB — GLUCOSE, CAPILLARY
GLUCOSE-CAPILLARY: 120 mg/dL — AB (ref 65–99)
GLUCOSE-CAPILLARY: 143 mg/dL — AB (ref 65–99)

## 2017-01-14 MED ORDER — OXYCODONE HCL 5 MG PO TABS: 5.0000 mg | ORAL_TABLET | ORAL | 0 refills | Status: DC | PRN

## 2017-01-14 MED ORDER — TRAMADOL HCL 50 MG PO TABS: 50.0000 mg | ORAL_TABLET | Freq: Four times a day (QID) | ORAL | 0 refills | Status: DC | PRN

## 2017-01-14 MED ORDER — METHOCARBAMOL 500 MG PO TABS: 500.0000 mg | ORAL_TABLET | Freq: Four times a day (QID) | ORAL | 0 refills | Status: DC | PRN

## 2017-01-14 MED ORDER — SODIUM CHLORIDE 0.9 % IV BOLUS (SEPSIS)
500.0000 mL | Freq: Once | INTRAVENOUS | Status: AC
  Administered 2017-01-14: 500 mL via INTRAVENOUS

## 2017-01-14 NOTE — Progress Notes (Signed)
Discharge planning, spoke with patient and daughter at bedside. Have chosen [facility] for [facility]. Contacted [facility] for referral. No DME needs.  [phone]

## 2017-01-14 NOTE — Progress Notes (Signed)
Physical Therapy Treatment Patient Details Name: Donald [name] MRN: [id number] DOB: Jan 27, 1927 Today's Date: 01/14/2017    History of Present Illness Pt s/p R UKR and with hx of TIA, CAD, CABG and ischemic cardiomyopathy    PT Comments    POD # 1 am session Applied KI and instructed pt and family on use.  Assisted OOB to amb in hallway with spouse "hands on" educated on safe handling and negotiating one step.  Returned to room then performed all supine TE's following HEP.  Instructed on proper tech, freq as well as use of ICE.  Pt ready for D/C to home.   Follow Up Recommendations  DC plan and follow up therapy as arranged by surgeon     Equipment Recommendations  None recommended by PT    Recommendations for Other Services       Precautions / Restrictions Precautions Precautions: Knee;Fall Precaution Comments: instructed on KI use  Required Braces or Orthoses: Knee Immobilizer - Right Knee Immobilizer - Right: Discontinue once straight leg raise with < 10 degree lag Restrictions Weight Bearing Restrictions: No    Mobility  Bed Mobility Overal bed mobility: Needs Assistance Bed Mobility: Supine to Sit     Supine to sit: Min assist     General bed mobility comments: Min Assist to support R LE off bed  Transfers Overall transfer level: Needs assistance Equipment used: Rolling walker (2 wheeled) Transfers: Sit to/from [facility] Sit to Stand: Min guard;Supervision Stand pivot transfers: Supervision;Min guard       General transfer comment: 25% VC's on proper hand placement and safety with turns.  Family present to observe and educated on safe handling.    Ambulation/Gait Ambulation/Gait assistance: Supervision;Min guard Ambulation Distance (Feet): 47 Feet Assistive device: Rolling walker (2 wheeled) Gait Pattern/deviations: Step-to pattern;Decreased stance time - right Gait velocity: decr   General Gait Details: 25% VC's on proper walker  to self distance and safety with turns   Stairs Stairs: Yes   Stair Management: No rails;Step to pattern;Forwards;With walker Number of Stairs: 1 General stair comments: with spouse present for "hands on" instruction, assisted pt with one step at 25% VC's on proper walker placement and proper sequencing.   Wheelchair Mobility    Modified Rankin (Stroke Patients Only)       Balance                                            Cognition Arousal/Alertness: Awake/alert Behavior During Therapy: WFL for tasks assessed/performed Overall Cognitive Status: Within Functional Limits for tasks assessed                                        Exercises   UNI  Knee Replacement TE's 10 reps B LE ankle pumps 10 reps towel squeezes 10 reps knee presses 10 reps heel slides  10 reps SAQ's 10 reps SLR's 10 reps ABD Followed by ICE     General Comments        Pertinent Vitals/Pain Pain Assessment: 0-10 Pain Score: 4  Pain Location: R knee Pain Descriptors / Indicators: Aching;Sore;Operative site guarding Pain Intervention(s): Monitored during session;Repositioned    Home Living  Prior Function            PT Goals (current goals can now be found in the care plan section) Progress towards PT goals: Progressing toward goals    Frequency    7X/week      PT Plan Current plan remains appropriate    Co-evaluation              AM-PAC PT "6 Clicks" Daily Activity  Outcome Measure  Difficulty turning over in bed (including adjusting bedclothes, sheets and blankets)?: A Lot Difficulty moving from lying on back to sitting on the side of the bed? : A Lot Difficulty sitting down on and standing up from a chair with arms (e.g., wheelchair, bedside commode, etc,.)?: A Lot Help needed moving to and from a bed to chair (including a wheelchair)?: A Little Help needed walking in hospital room?: A Little Help  needed climbing 3-5 steps with a railing? : A Little 6 Click Score: 15    End of Session Equipment Utilized During Treatment: Gait belt;Right knee immobilizer Activity Tolerance: Patient tolerated treatment well Patient left: in chair;with call bell/phone within reach;with chair alarm set;with family/visitor present Nurse Communication: Mobility status PT Visit Diagnosis: Difficulty in walking, not elsewhere classified (R26.2)     Time: 1002-1030 PT Time Calculation (min) (ACUTE ONLY): 28 min  Charges:  $Gait Training: 8-22 mins $Therapeutic Exercise: 8-22 mins                    G Codes:       Rica Koyanagi  PTA [facility]  Acute  Rehab Pager      [phone]

## 2017-01-14 NOTE — Evaluation (Signed)
Occupational Therapy Evaluation Patient Details Name: Donald [name] MRN: [id number] DOB: Mar 10, 1927 Today's Date: 01/14/2017    History of Present Illness Pt s/p R UKR and with hx of TIA, CAD, CABG and ischemic cardiomyopathy   Clinical Impression   This 83 year old man was admitted for the above. All education was completed. No further OT is needed at this time    Follow Up Recommendations  Supervision/Assistance - 24 hour    Equipment Recommendations  None recommended by OT    Recommendations for Other Services       Precautions / Restrictions Precautions Precautions: Knee;Fall Precaution Comments: instructed on KI use  Required Braces or Orthoses: Knee Immobilizer - Right Knee Immobilizer - Right: Discontinue once straight leg raise with < 10 degree lag Restrictions Weight Bearing Restrictions: No      Mobility Bed Mobility BY PT Overal bed mobility: Needs Assistance Bed Mobility: Supine to Sit     Supine to sit: Min assist     General bed mobility comments: Min Assist to support R LE off bed  Transfers Overall transfer level: Needs assistance Equipment used: Rolling walker (2 wheeled) Transfers: Sit to/from [facility] Sit to Stand: Min guard;Supervision Stand pivot transfers: Supervision;Min guard       Cues for UE/LE placement    Balance                                           ADL either performed or assessed with clinical judgement   ADL Overall ADL's : Needs assistance/impaired Eating/Feeding: Independent   Grooming: Supervision/safety;Standing   Upper Body Bathing: Set up;Sitting   Lower Body Bathing: Minimal assistance;Sit to/from stand   Upper Body Dressing : Set up;Sitting   Lower Body Dressing: Moderate assistance;Sit to/from stand   Toilet Transfer: Min guard;Ambulation;Comfort height toilet;RW;Grab bars   Toileting- Clothing Manipulation and Hygiene: Min guard;Sit to/from stand   Tub/  Shower Transfer: Walk-in shower;Min guard;Ambulation;Shower seat     General ADL Comments: daughter present and verbalizes understanding of shower transfer. Practiced this, toilet transfer and LB dressing     Vision         Perception     Praxis      Pertinent Vitals/Pain Pain Assessment: 0-10 Pain Score: 2  Pain Location: R knee Pain Descriptors / Indicators: Aching;Sore;Operative site guarding Pain Intervention(s): Monitored during session;Repositioned     Hand Dominance     Extremity/Trunk Assessment Upper Extremity Assessment Upper Extremity Assessment: Overall WFL for tasks assessed           Communication Communication Communication: No difficulties   Cognition Arousal/Alertness: Awake/alert Behavior During Therapy: WFL for tasks assessed/performed Overall Cognitive Status: Within Functional Limits for tasks assessed                                     General Comments       Exercises     Shoulder Instructions      Home Living Family/patient expects to be discharged to:: Private residence Living Arrangements: Alone                 Bathroom Shower/Tub: Occupational psychologist: Standard     Home Equipment: Environmental consultant - 2 wheels;Grab bars - toilet;Grab bars - tub/shower   Additional Comments: toilet is  standard with 2 grab bars. Pt does not want DME over toilet      Prior Functioning/Environment Level of Independence: Independent        Comments: Works as Psychologist, 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        OT Problem List:        OT Treatment/Interventions:      OT Goals(Current goals can be found in the care plan section) Acute Rehab OT Goals Patient Stated Goal: Regain IND OT Goal Formulation: All assessment and education complete, DC therapy  OT Frequency:     Barriers to D/C:            Co-evaluation              AM-PAC PT "6 Clicks" Daily Activity     Outcome Measure Help from another person eating meals?:  None Help from another person taking care of personal grooming?: A Little Help from another person toileting, which includes using toliet, bedpan, or urinal?: A Little Help from another person bathing (including washing, rinsing, drying)?: A Little Help from another person to put on and taking off regular upper body clothing?: A Little Help from another person to put on and taking off regular lower body clothing?: A Lot 6 Click Score: 18   End of Session    Activity Tolerance: Patient tolerated treatment well Patient left: in chair;with call bell/phone within reach;with chair alarm set;with family/visitor present  OT Visit Diagnosis: Pain Pain - Right/Left: Right Pain - part of body: Knee                Time: 0017-4944 OT Time Calculation (min): 22 min Charges:  OT General Charges $OT Visit: 1 Visit OT Evaluation $OT Eval Low Complexity: 1 Low G-Codes: OT G-codes **NOT FOR INPATIENT CLASS** Functional Assessment Tool Used: AM-PAC 6 Clicks Daily Activity Functional Limitation: Self care Self Care Current Status ([id number]): At least 40 percent but less than 60 percent impaired, limited or restricted Self Care Goal Status ([id number]): At least 40 percent but less than 60 percent impaired, limited or restricted Self Care Discharge Status [phone]): At least 40 percent but less than 60 percent impaired, limited or restricted   [name], OTR/L [phone] 01/14/2017  Chelcee Korpi 01/14/2017, 12:56 PM

## 2017-01-14 NOTE — Discharge Instructions (Signed)
Dr. Gaynelle Arabian Total Joint Specialist [facility] [address], [place], [place] [address] [phone]  UNI KNEE REPLACEMENT POSTOPERATIVE DIRECTIONS   Knee Rehabilitation, Guidelines Following Surgery  Results after knee surgery are often greatly improved when you follow the exercise, range of motion and muscle strengthening exercises prescribed by your doctor. Safety measures are also important to protect the knee from further injury. Any time any of these exercises cause you to have increased pain or swelling in your knee joint, decrease the amount until you are comfortable again and slowly increase them. If you have problems or questions, call your caregiver or physical therapist for advice.   HOME CARE INSTRUCTIONS  Remove items at home which could result in a fall. This includes throw rugs or furniture in walking pathways.   ICE to the affected knee every three hours for 30 minutes at a time and then as needed for pain and swelling.  Continue to use ice on the knee for pain and swelling from surgery. You may notice swelling that will progress down to the foot and ankle.  This is normal after surgery.  Elevate the leg when you are not up walking on it.    Continue to use the breathing machine which will help keep your temperature down.  It is common for your temperature to cycle up and down following surgery, especially at night when you are not up moving around and exerting yourself.  The breathing machine keeps your lungs expanded and your temperature down.  Do not place pillow under knee, focus on keeping the knee straight while resting  DIET You may resume your previous home diet once your are discharged from the hospital.  DRESSING / WOUND CARE / SHOWERING You may shower 3 days after surgery, but keep the wounds dry during showering.  You may use an occlusive plastic wrap (Press'n Seal for example), NO SOAKING/SUBMERGING IN THE BATHTUB.  If the  bandage gets wet, change with a clean dry gauze.  If the incision gets wet, pat the wound dry with a clean towel. Leave the surgical dressing on the knee for 48 hours.  May remove the dressing on the second day.  Remove the Ace Wrap along with the cotton padding.  Leave the steri-strip bandaids in place along the incision on the skin.  Cover the incision each day with some dry gauze and paper tape. You may start showering once you are discharged home but do not submerge the incision under water. Just pat the incision dry and apply a dry gauze dressing on daily. Change the surgical dressing daily and reapply a dry dressing each time.  ACTIVITY Walk with your walker as instructed. Use walker as long as suggested by your caregivers. Avoid periods of inactivity such as sitting longer than an hour when not asleep. This helps prevent blood clots.  You may resume a sexual relationship in one month or when given the OK by your doctor.  You may return to work once you are cleared by your doctor.  Do not drive a car for 6 weeks or until released by you surgeon.  Do not drive while taking narcotics.  WEIGHT BEARING Weight bearing as tolerated with assist device (walker, cane, etc) as directed, use it as long as suggested by your surgeon or therapist, typically at least 4-6 weeks.  POSTOPERATIVE CONSTIPATION PROTOCOL Constipation - defined medically as fewer than three stools per week and severe constipation as less than one stool per week.  One of the most common issues patients have following surgery is constipation.  Even if you have a regular bowel pattern at home, your normal regimen is likely to be disrupted due to multiple reasons following surgery.  Combination of anesthesia, postoperative narcotics, change in appetite and fluid intake all can affect your bowels.  In order to avoid complications following surgery, here are some recommendations in order to help you during your recovery  period.  Colace (docusate) - Pick up an over-the-counter form of Colace or another stool softener and take twice a day as long as you are requiring postoperative pain medications.  Take with a full glass of water daily.  If you experience loose stools or diarrhea, hold the colace until you stool forms back up.  If your symptoms do not get better within 1 week or if they get worse, check with your doctor.  Dulcolax (bisacodyl) - Pick up over-the-counter and take as directed by the product packaging as needed to assist with the movement of your bowels.  Take with a full glass of water.  Use this product as needed if not relieved by Colace only.   MiraLax (polyethylene glycol) - Pick up over-the-counter to have on hand.  MiraLax is a solution that will increase the amount of water in your bowels to assist with bowel movements.  Take as directed and can mix with a glass of water, juice, soda, coffee, or tea.  Take if you go more than two days without a movement. Do not use MiraLax more than once per day. Call your doctor if you are still constipated or irregular after using this medication for 7 days in a row.  If you continue to have problems with postoperative constipation, please contact the office for further assistance and recommendations.  If you experience "the worst abdominal pain ever" or develop nausea or vomiting, please contact the office immediatly for further recommendations for treatment.  ITCHING  If you experience itching with your medications, try taking only a single pain pill, or even half a pain pill at a time.  You can also use Benadryl over the counter for itching or also to help with sleep.   TED HOSE STOCKINGS Wear the elastic stockings on both legs for three weeks following surgery during the day but you may remove then at night for sleeping.  MEDICATIONS See your medication summary on the After Visit Summary that the nursing staff will review with you prior to discharge.   You may have some home medications which will be placed on hold until you complete the course of blood thinner medication.  It is important for you to complete the blood thinner medication as prescribed by your surgeon.  Continue your approved medications as instructed at time of discharge.  PRECAUTIONS If you experience chest pain or shortness of breath - call 911 immediately for transfer to the hospital emergency department.  If you develop a fever greater that 101 F, purulent drainage from wound, increased redness or drainage from wound, foul odor from the wound/dressing, or calf pain - CONTACT YOUR SURGEON.                                                   FOLLOW-UP APPOINTMENTS Make sure you keep all of your appointments after your operation with your surgeon and caregivers. You should call  the office at the above phone number and make an appointment for approximately two weeks after the date of your surgery or on the date instructed by your surgeon outlined in the "After Visit Summary".  RANGE OF MOTION AND STRENGTHENING EXERCISES  Rehabilitation of the knee is important following a knee injury or an operation. After just a few days of immobilization, the muscles of the thigh which control the knee become weakened and shrink (atrophy). Knee exercises are designed to build up the tone and strength of the thigh muscles and to improve knee motion. Often times heat used for twenty to thirty minutes before working out will loosen up your tissues and help with improving the range of motion but do not use heat for the first two weeks following surgery. These exercises can be done on a training (exercise) mat, on the floor, on a table or on a bed. Use what ever works the best and is most comfortable for you Knee exercises include:  Leg Lifts - While your knee is still immobilized in a splint or cast, you can do straight leg raises. Lift the leg to 60 degrees, hold for 3 sec, and slowly lower the leg. Repeat  10-20 times 2-3 times daily. Perform this exercise against resistance later as your knee gets better.  Quad and Hamstring Sets - Tighten up the muscle on the front of the thigh (Quad) and hold for 5-10 sec. Repeat this 10-20 times hourly. Hamstring sets are done by pushing the foot backward against an object and holding for 5-10 sec. Repeat as with quad sets.   Leg Slides: Lying on your back, slowly slide your foot toward your buttocks, bending your knee up off the floor (only go as far as is comfortable). Then slowly slide your foot back down until your leg is flat on the floor again.  Angel Wings: Lying on your back spread your legs to the side as far apart as you can without causing discomfort.  A rehabilitation program following serious knee injuries can speed recovery and prevent re-injury in the future due to weakened muscles. Contact your doctor or a physical therapist for more information on knee rehabilitation.   IF YOU ARE TRANSFERRED TO A SKILLED REHAB FACILITY If the patient is transferred to a skilled rehab facility following release from the hospital, a list of the current medications will be sent to the facility for the patient to continue.  When discharged from the skilled rehab facility, please have the facility set up the patient's [place] prior to being released. Also, the skilled facility will be responsible for providing the patient with their medications at time of release from the facility to include their pain medication, the muscle relaxants, and their blood thinner medication. If the patient is still at the rehab facility at time of the two week follow up appointment, the skilled rehab facility will also need to assist the patient in arranging follow up appointment in our office and any transportation needs.  MAKE SURE YOU:  Understand these instructions.  Get help right away if you are not doing well or get worse.    Pick up stool softner and laxative  for home use following surgery while on pain medications. Do not submerge incision under water. Please use good hand washing techniques while changing dressing each day. May shower starting three days after surgery. Please use a clean towel to pat the incision dry following showers. Continue to use ice for pain and swelling  after surgery. Do not use any lotions or creams on the incision until instructed by your surgeon.   Resume Eliquis at discharge

## 2017-01-14 NOTE — Progress Notes (Signed)
Nurse reviewed discharge instructions with patient, patient's daughter and patient's granddaughter. Granddaughter signed AVS for patient at patients request due to patient eating lunch at this time.

## 2017-01-14 NOTE — Care Management Obs Status (Signed)
[place] NOTIFICATION   Patient Details  Name: Donald [name] MRN: [id number] Date of Birth: 1926-08-01   Medicare Observation Status Notification Given:  Yes    Guadalupe Maple, RN 01/14/2017, 11:10 AM

## 2017-01-14 NOTE — Progress Notes (Signed)
OT Cancellation Note  Patient Details Name: STONY STEGMANN MRN: [id number] DOB: 12/14/1926   Cancelled Treatment:    Reason Eval/Treat Not Completed: Other (comment)  Pt is getting a bolus.  Will check back.   Maynard David 01/14/2017, 10:38 AM  [name], OTR/L [phone] 01/14/2017

## 2017-01-14 NOTE — Progress Notes (Signed)
   Subjective: 1 Day Post-Op Procedure(s) (LRB): UNICOMPARTMENTAL RIGHT KNEE (Right) Patient reports pain as mild.   Patient seen in rounds by Dr. Wynelle Link. Patient is well, but has had some minor complaints of pain in the knee, requiring pain medications Patient is ready to go home. Low pressure this morning, will give fluid bolus.   Objective: Vital signs in last 24 hours: Temp:  [96.5 F (35.8 C)-98.2 F (36.8 C)] 97.5 F (36.4 C) (10/18 0631) Pulse Rate:  [51-73] 51 (10/18 0631) Resp:  [11-20] 16 (10/18 0631) BP: (98-115)/(47-69) 98/58 (10/18 0631) SpO2:  [94 %-100 %] 98 % (10/18 0631) Weight:  [85.7 kg (189 lb)] 85.7 kg (189 lb) (10/17 1335)  Intake/Output from previous day:  Intake/Output Summary (Last 24 hours) at 01/14/17 0810 Last data filed at 01/14/17 0718  Gross per 24 hour  Intake          4043.32 ml  Output             1435 ml  Net          2608.32 ml    Intake/Output this shift: Total I/O In: -  Out: 225 [Urine:225]  Labs:  Recent Labs  01/14/17 0608  HGB 11.6*    Recent Labs  01/14/17 0608  WBC 11.0*  RBC 3.61*  HCT 34.9*  PLT 221    Recent Labs  01/14/17 0608  NA 139  K 3.8  CL 110  CO2 23  BUN 22*  CREATININE 0.79  GLUCOSE 130*  CALCIUM 8.7*   No results for input(s): LABPT, INR in the last 72 hours.  EXAM: General - Patient is Alert, Appropriate and Oriented Extremity - Neurovascular intact Sensation intact distally Dorsiflexion/Plantar flexion intact Dressing - clean, dry, no drainage Motor Function - intact, moving foot and toes well on exam.  Hemovac pulled without difficulty.  Assessment/Plan: 1 Day Post-Op Procedure(s) (LRB): UNICOMPARTMENTAL RIGHT KNEE (Right) Procedure(s) (LRB): UNICOMPARTMENTAL RIGHT KNEE (Right) Past Medical History:  Diagnosis Date  . Asthma    with worsening with beta blockade  . CAD (coronary artery disease)   . CVA (cerebral infarction) 2012   tia, mild no residual defecits  . GI  bleed 8 yrs ago   due to doll fully vessel which was clipped  . Glaucoma    excellent cataracts  . Ischemic cardiomyopathy   . Nephrolithiasis   . 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 Prostate cancer ([place]) 2012  . Radiation proctitis Feb 2015   treated with APC  . Tubular adenoma 04/2013   Dr. Hilarie Fredrickson   Principal Problem:   OA (osteoarthritis) of knee  Estimated body mass index is 27.91 kg/m as calculated from the following:   Height as of this encounter: 5\' 9"  (1.753 m).   Weight as of this encounter: 85.7 kg (189 lb). Up with therapy Discharge home with home health Diet - Cardiac diet Follow up - in 2 weeks Activity - WBAT Dressing - May remove the surgical dressing tomorrow at home and then apply a dry gauze dressing daily. May shower three days following surgery but do not submerge the incision under water. Disposition - Home Condition Upon Discharge - Stable D/C Meds - See DC Summary DVT Prophylaxis Xarelto 10 mg daily for ten days, then change to Aspirin 325 mg daily for two weeks, then reduce to Baby Aspirin 81 mg daily for three additional weeks.  Arlee Muslim, PA-C Orthopaedic Surgery 01/14/2017, 8:10 AM

## 2017-01-14 NOTE — Discharge Summary (Signed)
Physician Discharge Summary   Patient ID: Donald [name] MRN: [id number] DOB/AGE: July 16, 1926 83 y.o.  Admit date: 01/13/2017 Discharge date: 01/14/17  Primary Diagnosis:  Medial compartment osteoarthritis, Right knee Admission Diagnoses:  Past Medical History:  Diagnosis Date  . Asthma    with worsening with beta blockade  . CAD (coronary artery disease)   . CVA (cerebral infarction) 2012   tia, mild no residual defecits  . GI bleed 8 yrs ago   due to doll fully vessel which was clipped  . Glaucoma    excellent cataracts  . Ischemic cardiomyopathy   . Nephrolithiasis   . 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 Prostate cancer ([place]) 2012  . Radiation proctitis Feb 2015   treated with APC  . Tubular adenoma 04/2013   Dr. Hilarie Fredrickson   Discharge Diagnoses:   Principal Problem:   OA (osteoarthritis) of knee  Estimated body mass index is 27.91 kg/m as calculated from the following:   Height as of this encounter: 5' 9"  (1.753 m).   Weight as of this encounter: 85.7 kg (189 lb).  Procedure:  Procedure(s) (LRB): UNICOMPARTMENTAL RIGHT KNEE (Right)   Consults: None  HPI: Donald [name] is a 83 y.o. male, who has  significant isolated medial compartment arthritis of the Right knee. The patient has had nonoperative management including injections of cortisone and viscous supplements. Unfortunately, the pain persists.  Radiograph showed isolated medial compartment bone-on-bone arthritis  with normal-appearing patellofemoral and lateral compartments. The patient presents now for left knee unicompartmental arthroplasty.  Laboratory Data: Admission on 01/13/2017  Component Date Value Ref Range Status  . WBC 01/14/2017 11.0* 4.0 - 10.5 K/uL Final  . RBC 01/14/2017 3.61* 4.22 - 5.81 MIL/uL Final  . Hemoglobin 01/14/2017 11.6* 13.0 - 17.0 g/dL Final  . HCT 01/14/2017 34.9* 39.0 - 52.0 % Final  . MCV 01/14/2017 96.7  78.0 - 100.0 fL Final  . MCH 01/14/2017 32.1  26.0 - 34.0 pg  Final  . MCHC 01/14/2017 33.2  30.0 - 36.0 g/dL Final  . RDW 01/14/2017 14.0  11.5 - 15.5 % Final  . Platelets 01/14/2017 221  150 - 400 K/uL Final  . Sodium 01/14/2017 139  135 - 145 mmol/L Final  . Potassium 01/14/2017 3.8  3.5 - 5.1 mmol/L Final  . Chloride 01/14/2017 110  101 - 111 mmol/L Final  . CO2 01/14/2017 23  22 - 32 mmol/L Final  . Glucose, Bld 01/14/2017 130* 65 - 99 mg/dL Final  . BUN 01/14/2017 22* 6 - 20 mg/dL Final  . Creatinine, Ser 01/14/2017 0.79  0.61 - 1.24 mg/dL Final  . Calcium 01/14/2017 8.7* 8.9 - 10.3 mg/dL Final  . GFR calc non Af Amer 01/14/2017 >60  >60 mL/min Final  . GFR calc Af Amer 01/14/2017 >60  >60 mL/min Final   Comment: (NOTE) The eGFR has been calculated using the CKD EPI equation. This calculation has not been validated in all clinical situations. eGFR's persistently <60 mL/min signify possible Chronic Kidney Disease.   . Anion gap 01/14/2017 6  5 - 15 Final  . Glucose-Capillary 01/14/2017 120* 65 - 99 mg/dL Final  Hospital Outpatient Visit on 01/06/2017  Component Date Value Ref Range Status  . aPTT 01/06/2017 30  24 - 36 seconds Final  . WBC 01/06/2017 3.9* 4.0 - 10.5 K/uL Final  . RBC 01/06/2017 4.33  4.22 - 5.81 MIL/uL Final  . Hemoglobin 01/06/2017 13.7  13.0 - 17.0 g/dL Final  . HCT 01/06/2017  41.5  39.0 - 52.0 % Final  . MCV 01/06/2017 95.8  78.0 - 100.0 fL Final  . MCH 01/06/2017 31.6  26.0 - 34.0 pg Final  . MCHC 01/06/2017 33.0  30.0 - 36.0 g/dL Final  . RDW 01/06/2017 13.8  11.5 - 15.5 % Final  . Platelets 01/06/2017 248  150 - 400 K/uL Final  . Sodium 01/06/2017 139  135 - 145 mmol/L Final  . Potassium 01/06/2017 4.3  3.5 - 5.1 mmol/L Final  . Chloride 01/06/2017 106  101 - 111 mmol/L Final  . CO2 01/06/2017 26  22 - 32 mmol/L Final  . Glucose, Bld 01/06/2017 103* 65 - 99 mg/dL Final  . BUN 01/06/2017 22* 6 - 20 mg/dL Final  . Creatinine, Ser 01/06/2017 1.12  0.61 - 1.24 mg/dL Final  . Calcium 01/06/2017 9.3  8.9 - 10.3  mg/dL Final  . Total Protein 01/06/2017 6.4* 6.5 - 8.1 g/dL Final  . Albumin 01/06/2017 3.8  3.5 - 5.0 g/dL Final  . AST 01/06/2017 22  15 - 41 U/L Final  . ALT 01/06/2017 13* 17 - 63 U/L Final  . Alkaline Phosphatase 01/06/2017 34* 38 - 126 U/L Final  . Total Bilirubin 01/06/2017 0.6  0.3 - 1.2 mg/dL Final  . GFR calc non Af Amer 01/06/2017 57* >60 mL/min Final  . GFR calc Af Amer 01/06/2017 >60  >60 mL/min Final   Comment: (NOTE) The eGFR has been calculated using the CKD EPI equation. This calculation has not been validated in all clinical situations. eGFR's persistently <60 mL/min signify possible Chronic Kidney Disease.   . Anion gap 01/06/2017 7  5 - 15 Final  . Prothrombin Time 01/06/2017 14.7  11.4 - 15.2 seconds Final  . INR 01/06/2017 1.16   Final  . ABO/RH(D) 01/06/2017 A POS   Final  . Antibody Screen 01/06/2017 NEG   Final  . Sample Expiration 01/06/2017 01/16/2017   Final  . Extend sample reason 01/06/2017 NO TRANSFUSIONS OR PREGNANCY IN THE PAST 3 MONTHS   Final  . MRSA, PCR 01/06/2017 NEGATIVE  NEGATIVE Final  . Staphylococcus aureus 01/06/2017 NEGATIVE  NEGATIVE Final   Comment: (NOTE) The Xpert SA Assay (FDA approved for NASAL specimens in patients 52 years of age and older), is one component of a comprehensive surveillance program. It is not intended to diagnose infection nor to guide or monitor treatment.   . ABO/RH(D) 01/06/2017 A POS   Final  Hospital Outpatient Visit on 12/03/2016  Component Date Value Ref Range Status  . Rest HR 12/03/2016 62  bpm Final  . Rest BP 12/03/2016 105/64  mmHg Final  . Peak HR 12/03/2016 87  bpm Final  . Peak BP 12/03/2016 112/59  mmHg Final  . SSS 12/03/2016 11   Final  . SRS 12/03/2016 10   Final  . SDS 12/03/2016 1   Final  . TID 12/03/2016 1.09   Final  . LV sys vol 12/03/2016 55  mL Final  . LV dias vol 12/03/2016 102  62 - 150 mL Final     X-Rays:No results found.  EKG: Orders placed or performed in visit on  11/26/16  . EKG 12-Lead     Hospital Course: Donald [name] is a 83 y.o. who was admitted to [facility]. They were brought to the operating room on 01/13/2017 and underwent Procedure(s): UNICOMPARTMENTAL RIGHT KNEE.  Patient tolerated the procedure well and was later transferred to the recovery room and then to the  orthopaedic floor for postoperative care.  They were given PO and IV analgesics for pain control following their surgery.  They were given 24 hours of postoperative antibiotics of  Anti-infectives    Start     Dose/Rate Route Frequency Ordered Stop   01/13/17 2200  vancomycin (VANCOCIN) IVPB 1000 mg/200 mL premix     1,000 mg 200 mL/hr over 60 Minutes Intravenous Every 12 hours 01/13/17 1341 01/13/17 2200   01/13/17 0818  vancomycin (VANCOCIN) IVPB 1000 mg/200 mL premix     1,000 mg 200 mL/hr over 60 Minutes Intravenous On call to O.R. 01/13/17 0818 01/13/17 1015     and started on DVT prophylaxis in the form of Xarelto.   PT and OT were ordered for postop therapy protocol.  Discharge planning consulted to help with postop disposition and equipment needs.  Patient had a good night on the evening of surgery.  They started to get up OOB with therapy on day one. Hemovac drain was pulled without difficulty.  Patient was seen in rounds on day one and it was felt that as long as they did well with the remaining sessions of therapy that they would be ready to go home.  Arrangements were made and they were setup to go home on POD 1.  Discharge home with home health Diet - Cardiac diet Follow up - in 2 weeks Activity - WBAT Dressing - May remove the surgical dressing tomorrow at home and then apply a dry gauze dressing daily. May shower three days following surgery but do not submerge the incision under water. Disposition - Home Condition Upon Discharge - Stable D/C Meds - See DC Summary DVT Prophylaxis Xarelto 10 mg daily for ten days, then change to Aspirin 325 mg daily for  two weeks, then reduce to Baby Aspirin 81 mg daily for three additional weeks.  Discharge Instructions    Call MD / Call 911    Complete by:  As directed    If you experience chest pain or shortness of breath, CALL 911 and be transported to the hospital emergency room.  If you develope a fever above 101 F, pus (white drainage) or increased drainage or redness at the wound, or calf pain, call your surgeon's office.   Change dressing    Complete by:  As directed    Change dressing daily with sterile 4 x 4 inch gauze dressing and apply TED hose. Do not submerge the incision under water.   Constipation Prevention    Complete by:  As directed    Drink plenty of fluids.  Prune juice may be helpful.  You may use a stool softener, such as Colace (over the counter) 100 mg twice a day.  Use MiraLax (over the counter) for constipation as needed.   Diet - low sodium heart healthy    Complete by:  As directed    Discharge instructions    Complete by:  As directed    Resume Eliquis at home following discharge.   Pick up stool softner and laxative for home use following surgery while on pain medications. Do not submerge incision under water. Please use good hand washing techniques while changing dressing each day. May shower starting three days after surgery. Please use a clean towel to pat the incision dry following showers. Continue to use ice for pain and swelling after surgery. Do not use any lotions or creams on the incision until instructed by your surgeon.  Wear both TED hose on both legs  during the day every day for three weeks, but may remove the TED hose at night at home.  Postoperative Constipation Protocol  Constipation - defined medically as fewer than three stools per week and severe constipation as less than one stool per week.  One of the most common issues patients have following surgery is constipation.  Even if you have a regular bowel pattern at home, your normal regimen is  likely to be disrupted due to multiple reasons following surgery.  Combination of anesthesia, postoperative narcotics, change in appetite and fluid intake all can affect your bowels.  In order to avoid complications following surgery, here are some recommendations in order to help you during your recovery period.  Colace (docusate) - Pick up an over-the-counter form of Colace or another stool softener and take twice a day as long as you are requiring postoperative pain medications.  Take with a full glass of water daily.  If you experience loose stools or diarrhea, hold the colace until you stool forms back up.  If your symptoms do not get better within 1 week or if they get worse, check with your doctor.  Dulcolax (bisacodyl) - Pick up over-the-counter and take as directed by the product packaging as needed to assist with the movement of your bowels.  Take with a full glass of water.  Use this product as needed if not relieved by Colace only.   MiraLax (polyethylene glycol) - Pick up over-the-counter to have on hand.  MiraLax is a solution that will increase the amount of water in your bowels to assist with bowel movements.  Take as directed and can mix with a glass of water, juice, soda, coffee, or tea.  Take if you go more than two days without a movement. Do not use MiraLax more than once per day. Call your doctor if you are still constipated or irregular after using this medication for 7 days in a row.  If you continue to have problems with postoperative constipation, please contact the office for further assistance and recommendations.  If you experience "the worst abdominal pain ever" or develop nausea or vomiting, please contact the office immediatly for further recommendations for treatment.   Do not put a pillow under the knee. Place it under the heel.    Complete by:  As directed    Do not sit on low chairs, stoools or toilet seats, as it may be difficult to get up from low surfaces     Complete by:  As directed    Driving restrictions    Complete by:  As directed    No driving until released by the physician.   Increase activity slowly as tolerated    Complete by:  As directed    Lifting restrictions    Complete by:  As directed    No lifting until released by the physician.   Patient may shower    Complete by:  As directed    You may shower without a dressing once there is no drainage.  Do not wash over the wound.  If drainage remains, do not shower until drainage stops.   TED hose    Complete by:  As directed    Use stockings (TED hose) for 3 weeks on both leg(s).  You may remove them at night for sleeping.   Weight bearing as tolerated    Complete by:  As directed      Allergies as of 01/14/2017      Reactions  Keflex [cephalexin] Other (See Comments)   Nausea, vomiting killed all GI enzymes, would not like to take   Morphine Nausea Only      Medication List    TAKE these medications   acetaminophen 500 MG tablet Commonly known as:  TYLENOL Take 500-1,000 mg by mouth every 8 (eight) hours as needed for mild pain or headache (1-2 tablets depending on pain).   albuterol 108 (90 Base) MCG/ACT inhaler Commonly known as:  PROVENTIL HFA;VENTOLIN HFA Inhale 1 puff into the lungs every 6 (six) hours as needed for wheezing or shortness of breath.   apixaban 5 MG Tabs tablet Commonly known as:  ELIQUIS Take 1 tablet (5 mg total) by mouth 2 (two) times daily.   BREO ELLIPTA 100-25 MCG/INH Aepb Generic drug:  fluticasone furoate-vilanterol Inhale 1 puff into the lungs daily as needed (shortness of breath).   fenofibrate micronized 200 MG capsule Commonly known as:  LOFIBRA Take 1 capsule (200 mg total) by mouth daily.   FLOMAX 0.4 MG Caps capsule Generic drug:  tamsulosin Take 0.4 mg by mouth every evening.   latanoprost 0.005 % ophthalmic solution Commonly known as:  XALATAN Place 1 drop into the left eye at bedtime.   loratadine 10 MG  tablet Commonly known as:  CLARITIN Take 10 mg by mouth daily.   methocarbamol 500 MG tablet Commonly known as:  ROBAXIN Take 1 tablet (500 mg total) by mouth every 6 (six) hours as needed for muscle spasms.   multivitamin with minerals Tabs tablet Take 1 tablet by mouth daily.   oxyCODONE 5 MG immediate release tablet Commonly known as:  Oxy IR/ROXICODONE Take 1-2 tablets (5-10 mg total) by mouth every 4 (four) hours as needed for moderate pain.   pravastatin 40 MG tablet Commonly known as:  PRAVACHOL Take 1 tablet (40 mg total) by mouth at bedtime.   ramipril 2.5 MG capsule Commonly known as:  ALTACE Take 1 capsule (2.5 mg total) by mouth daily.   traMADol 50 MG tablet Commonly known as:  ULTRAM Take 1-2 tablets (50-100 mg total) by mouth every 6 (six) hours as needed for moderate pain.   Vitamin D 2000 units tablet Take 2,000 Units by mouth daily.            Discharge Care Instructions        Start     Ordered   01/14/17 0000  Weight bearing as tolerated     01/14/17 0822   01/14/17 0000  Change dressing    Comments:  Change dressing daily with sterile 4 x 4 inch gauze dressing and apply TED hose. Do not submerge the incision under water.   01/14/17 2458     Follow-up Information    Gaynelle Arabian, MD. Schedule an appointment as soon as possible for a visit on 01/26/2017.   Specialty:  Orthopedic Surgery Contact information: [address] [place] [address] [phone]           Signed: Arlee Muslim, PA-C Orthopaedic Surgery 01/14/2017, 8:23 AM

## 2017-01-15 DIAGNOSIS — Z471 Aftercare following joint replacement surgery: Secondary | ICD-10-CM | POA: Diagnosis not present

## 2017-01-15 DIAGNOSIS — I255 Ischemic cardiomyopathy: Secondary | ICD-10-CM | POA: Diagnosis not present

## 2017-01-15 DIAGNOSIS — M5442 Lumbago with sciatica, left side: Secondary | ICD-10-CM | POA: Diagnosis not present

## 2017-01-15 DIAGNOSIS — M5136 Other intervertebral disc degeneration, lumbar region: Secondary | ICD-10-CM | POA: Diagnosis not present

## 2017-01-15 DIAGNOSIS — I2510 Atherosclerotic heart disease of native coronary artery without angina pectoris: Secondary | ICD-10-CM | POA: Diagnosis not present

## 2017-01-15 DIAGNOSIS — I4891 Unspecified atrial fibrillation: Secondary | ICD-10-CM | POA: Diagnosis not present

## 2017-01-18 DIAGNOSIS — I2510 Atherosclerotic heart disease of native coronary artery without angina pectoris: Secondary | ICD-10-CM | POA: Diagnosis not present

## 2017-01-18 DIAGNOSIS — M5442 Lumbago with sciatica, left side: Secondary | ICD-10-CM | POA: Diagnosis not present

## 2017-01-18 DIAGNOSIS — I4891 Unspecified atrial fibrillation: Secondary | ICD-10-CM | POA: Diagnosis not present

## 2017-01-18 DIAGNOSIS — I255 Ischemic cardiomyopathy: Secondary | ICD-10-CM | POA: Diagnosis not present

## 2017-01-18 DIAGNOSIS — Z471 Aftercare following joint replacement surgery: Secondary | ICD-10-CM | POA: Diagnosis not present

## 2017-01-18 DIAGNOSIS — M5136 Other intervertebral disc degeneration, lumbar region: Secondary | ICD-10-CM | POA: Diagnosis not present

## 2017-01-19 DIAGNOSIS — I2510 Atherosclerotic heart disease of native coronary artery without angina pectoris: Secondary | ICD-10-CM | POA: Diagnosis not present

## 2017-01-19 DIAGNOSIS — I4891 Unspecified atrial fibrillation: Secondary | ICD-10-CM | POA: Diagnosis not present

## 2017-01-19 DIAGNOSIS — M5136 Other intervertebral disc degeneration, lumbar region: Secondary | ICD-10-CM | POA: Diagnosis not present

## 2017-01-19 DIAGNOSIS — M5442 Lumbago with sciatica, left side: Secondary | ICD-10-CM | POA: Diagnosis not present

## 2017-01-19 DIAGNOSIS — I255 Ischemic cardiomyopathy: Secondary | ICD-10-CM | POA: Diagnosis not present

## 2017-01-19 DIAGNOSIS — Z471 Aftercare following joint replacement surgery: Secondary | ICD-10-CM | POA: Diagnosis not present

## 2017-01-21 DIAGNOSIS — M5442 Lumbago with sciatica, left side: Secondary | ICD-10-CM | POA: Diagnosis not present

## 2017-01-21 DIAGNOSIS — M5136 Other intervertebral disc degeneration, lumbar region: Secondary | ICD-10-CM | POA: Diagnosis not present

## 2017-01-21 DIAGNOSIS — Z471 Aftercare following joint replacement surgery: Secondary | ICD-10-CM | POA: Diagnosis not present

## 2017-01-21 DIAGNOSIS — I2510 Atherosclerotic heart disease of native coronary artery without angina pectoris: Secondary | ICD-10-CM | POA: Diagnosis not present

## 2017-01-21 DIAGNOSIS — I4891 Unspecified atrial fibrillation: Secondary | ICD-10-CM | POA: Diagnosis not present

## 2017-01-21 DIAGNOSIS — I255 Ischemic cardiomyopathy: Secondary | ICD-10-CM | POA: Diagnosis not present

## 2017-01-25 DIAGNOSIS — M5442 Lumbago with sciatica, left side: Secondary | ICD-10-CM | POA: Diagnosis not present

## 2017-01-25 DIAGNOSIS — I2510 Atherosclerotic heart disease of native coronary artery without angina pectoris: Secondary | ICD-10-CM | POA: Diagnosis not present

## 2017-01-25 DIAGNOSIS — I255 Ischemic cardiomyopathy: Secondary | ICD-10-CM | POA: Diagnosis not present

## 2017-01-25 DIAGNOSIS — M5136 Other intervertebral disc degeneration, lumbar region: Secondary | ICD-10-CM | POA: Diagnosis not present

## 2017-01-25 DIAGNOSIS — Z471 Aftercare following joint replacement surgery: Secondary | ICD-10-CM | POA: Diagnosis not present

## 2017-01-25 DIAGNOSIS — I4891 Unspecified atrial fibrillation: Secondary | ICD-10-CM | POA: Diagnosis not present

## 2017-01-26 DIAGNOSIS — M1711 Unilateral primary osteoarthritis, right knee: Secondary | ICD-10-CM | POA: Diagnosis not present

## 2017-02-16 DIAGNOSIS — M1711 Unilateral primary osteoarthritis, right knee: Secondary | ICD-10-CM | POA: Diagnosis not present

## 2017-02-16 DIAGNOSIS — Z96651 Presence of right artificial knee joint: Secondary | ICD-10-CM | POA: Diagnosis not present

## 2017-02-16 DIAGNOSIS — Z471 Aftercare following joint replacement surgery: Secondary | ICD-10-CM | POA: Diagnosis not present

## 2017-02-26 ENCOUNTER — Telehealth: Payer: Self-pay | Admitting: Cardiology

## 2017-02-26 ENCOUNTER — Other Ambulatory Visit: Payer: Self-pay

## 2017-02-26 MED ORDER — RAMIPRIL 2.5 MG PO CAPS: 2.5000 mg | ORAL_CAPSULE | Freq: Every day | ORAL | 2 refills | Status: DC

## 2017-02-26 NOTE — Telephone Encounter (Signed)
°*  STAT* If patient is at the pharmacy, call can be transferred to refill team.   1. Which medications need to be refilled? (please list name of each medication and dose if known) Ramipril 2.5 mg  2. Which pharmacy/location (including street and city if local pharmacy) is medication to be sent to?[place], [place]  3. Do they need a 30 day or 90 day supply? [place]

## 2017-03-04 ENCOUNTER — Other Ambulatory Visit: Payer: Self-pay | Admitting: Pharmacist Clinician (PhC)/ Clinical Pharmacy Specialist

## 2017-03-04 MED ORDER — APIXABAN 5 MG PO TABS: 5.0000 mg | ORAL_TABLET | Freq: Two times a day (BID) | ORAL | 0 refills | Status: DC

## 2017-03-05 ENCOUNTER — Other Ambulatory Visit: Payer: Self-pay | Admitting: Urology

## 2017-03-05 DIAGNOSIS — R310 Gross hematuria: Secondary | ICD-10-CM | POA: Diagnosis not present

## 2017-03-05 MED ORDER — DOXYCYCLINE HYCLATE 100 MG PO TABS: 100.0000 mg | ORAL_TABLET | Freq: Two times a day (BID) | ORAL | Status: AC

## 2017-03-05 NOTE — Progress Notes (Signed)
Contacted by patient, stating that abx were note sent in. Per Urochart Diane sent doxy100mg  BID x 7 days. Script re- sent via Epic to local pharmacy

## 2017-03-12 DIAGNOSIS — R310 Gross hematuria: Secondary | ICD-10-CM | POA: Diagnosis not present

## 2017-03-12 DIAGNOSIS — N132 Hydronephrosis with renal and ureteral calculous obstruction: Secondary | ICD-10-CM | POA: Diagnosis not present

## 2017-03-12 DIAGNOSIS — N201 Calculus of ureter: Secondary | ICD-10-CM | POA: Diagnosis not present

## 2017-03-25 DIAGNOSIS — M1711 Unilateral primary osteoarthritis, right knee: Secondary | ICD-10-CM | POA: Diagnosis not present

## 2017-04-01 ENCOUNTER — Other Ambulatory Visit: Payer: Self-pay | Admitting: Pharmacist Clinician (PhC)/ Clinical Pharmacy Specialist

## 2017-04-01 DIAGNOSIS — H4032X3 Glaucoma secondary to eye trauma, left eye, severe stage: Secondary | ICD-10-CM | POA: Diagnosis not present

## 2017-04-01 DIAGNOSIS — H1851 Endothelial corneal dystrophy: Secondary | ICD-10-CM | POA: Diagnosis not present

## 2017-04-01 DIAGNOSIS — H4720 Unspecified optic atrophy: Secondary | ICD-10-CM | POA: Diagnosis not present

## 2017-04-01 DIAGNOSIS — H40011 Open angle with borderline findings, low risk, right eye: Secondary | ICD-10-CM | POA: Diagnosis not present

## 2017-04-01 MED ORDER — APIXABAN 5 MG PO TABS: 5.0000 mg | ORAL_TABLET | Freq: Two times a day (BID) | ORAL | 1 refills | Status: DC

## 2017-04-08 DIAGNOSIS — C4402 Squamous cell carcinoma of skin of lip: Secondary | ICD-10-CM | POA: Diagnosis not present

## 2017-04-08 DIAGNOSIS — D485 Neoplasm of uncertain behavior of skin: Secondary | ICD-10-CM | POA: Diagnosis not present

## 2017-04-08 DIAGNOSIS — L570 Actinic keratosis: Secondary | ICD-10-CM | POA: Diagnosis not present

## 2017-04-16 DIAGNOSIS — N200 Calculus of kidney: Secondary | ICD-10-CM | POA: Diagnosis not present

## 2017-04-16 DIAGNOSIS — N401 Enlarged prostate with lower urinary tract symptoms: Secondary | ICD-10-CM | POA: Diagnosis not present

## 2017-04-16 DIAGNOSIS — C61 Malignant neoplasm of prostate: Secondary | ICD-10-CM | POA: Diagnosis not present

## 2017-04-16 DIAGNOSIS — R351 Nocturia: Secondary | ICD-10-CM | POA: Diagnosis not present

## 2017-04-20 ENCOUNTER — Telehealth: Payer: Self-pay | Admitting: *Deleted

## 2017-04-20 NOTE — Telephone Encounter (Signed)
Dr [name] has reviewed the patients chart and has given the okay for the patient to hold Eliquis 2 days prior to the procedure and to resume 2 days after the procedure. This note will be faxed to the number provided.

## 2017-04-20 NOTE — Telephone Encounter (Signed)
   [facility] Pre-operative Risk Assessment    Request for surgical clearance:  1. What type of surgery is being performed? LITHOTRIPSY   2. When is this surgery scheduled? PENDING   3. What type of clearance is required (medical clearance vs. Pharmacy clearance to hold med vs. Both)?   4. Are there any medications that need to be held prior to surgery and how long?ELIQUIS   5. Practice name and name of physician performing surgery? Franchot Gallo MD  6. What is your office phone and fax number? [phone] [phone].  Anesthesia type (None, local, MAC, general) ? UNKNOWN  Donald [name] 04/20/2017, 11:52 AM  _________________________________________________________________   (provider comments below)

## 2017-04-21 ENCOUNTER — Other Ambulatory Visit: Payer: Self-pay | Admitting: Urology

## 2017-04-21 DIAGNOSIS — I509 Heart failure, unspecified: Secondary | ICD-10-CM | POA: Diagnosis not present

## 2017-04-21 DIAGNOSIS — D692 Other nonthrombocytopenic purpura: Secondary | ICD-10-CM | POA: Diagnosis not present

## 2017-04-21 DIAGNOSIS — Z6827 Body mass index (BMI) 27.0-27.9, adult: Secondary | ICD-10-CM | POA: Diagnosis not present

## 2017-04-21 DIAGNOSIS — I110 Hypertensive heart disease with heart failure: Secondary | ICD-10-CM | POA: Diagnosis not present

## 2017-04-21 DIAGNOSIS — J449 Chronic obstructive pulmonary disease, unspecified: Secondary | ICD-10-CM | POA: Diagnosis not present

## 2017-04-21 DIAGNOSIS — N320 Bladder-neck obstruction: Secondary | ICD-10-CM | POA: Diagnosis not present

## 2017-04-21 DIAGNOSIS — R7301 Impaired fasting glucose: Secondary | ICD-10-CM | POA: Diagnosis not present

## 2017-04-21 DIAGNOSIS — E441 Mild protein-calorie malnutrition: Secondary | ICD-10-CM | POA: Diagnosis not present

## 2017-04-21 DIAGNOSIS — Z7901 Long term (current) use of anticoagulants: Secondary | ICD-10-CM | POA: Diagnosis not present

## 2017-04-21 DIAGNOSIS — I6789 Other cerebrovascular disease: Secondary | ICD-10-CM | POA: Diagnosis not present

## 2017-04-21 DIAGNOSIS — Z9861 Coronary angioplasty status: Secondary | ICD-10-CM | POA: Diagnosis not present

## 2017-04-23 ENCOUNTER — Encounter (HOSPITAL_COMMUNITY): Payer: Self-pay | Admitting: General Practice

## 2017-04-29 ENCOUNTER — Other Ambulatory Visit: Payer: Self-pay

## 2017-04-29 ENCOUNTER — Encounter (HOSPITAL_COMMUNITY): Payer: Self-pay | Admitting: *Deleted

## 2017-04-29 ENCOUNTER — Ambulatory Visit (HOSPITAL_COMMUNITY): Payer: Medicare Other

## 2017-04-29 ENCOUNTER — Telehealth: Payer: Self-pay | Admitting: Pharmacist Clinician (PhC)/ Clinical Pharmacy Specialist

## 2017-04-29 ENCOUNTER — Encounter (HOSPITAL_COMMUNITY)
Admission: RE | Disposition: A | Discharge status: Home / Self Care | Payer: Self-pay | Source: Ambulatory Visit | Attending: Urology

## 2017-04-29 ENCOUNTER — Ambulatory Visit (HOSPITAL_COMMUNITY)
Admission: RE | Admit: 2017-04-29 | Discharge: 2017-04-29 | Disposition: A | Discharge status: Home / Self Care | Payer: Medicare Other | Source: Ambulatory Visit | Attending: Urology | Admitting: Urology

## 2017-04-29 DIAGNOSIS — Z881 Allergy status to other antibiotic agents status: Secondary | ICD-10-CM | POA: Insufficient documentation

## 2017-04-29 DIAGNOSIS — J45909 Unspecified asthma, uncomplicated: Secondary | ICD-10-CM | POA: Diagnosis not present

## 2017-04-29 DIAGNOSIS — N200 Calculus of kidney: Secondary | ICD-10-CM | POA: Diagnosis not present

## 2017-04-29 DIAGNOSIS — Z8673 Personal history of transient ischemic attack (TIA), and cerebral infarction without residual deficits: Secondary | ICD-10-CM | POA: Diagnosis not present

## 2017-04-29 DIAGNOSIS — Z87442 Personal history of urinary calculi: Secondary | ICD-10-CM | POA: Diagnosis not present

## 2017-04-29 DIAGNOSIS — I2510 Atherosclerotic heart disease of native coronary artery without angina pectoris: Secondary | ICD-10-CM | POA: Diagnosis not present

## 2017-04-29 DIAGNOSIS — I255 Ischemic cardiomyopathy: Secondary | ICD-10-CM | POA: Diagnosis not present

## 2017-04-29 DIAGNOSIS — Z8546 Personal history of malignant neoplasm of prostate: Secondary | ICD-10-CM | POA: Insufficient documentation

## 2017-04-29 DIAGNOSIS — Z955 Presence of coronary angioplasty implant and graft: Secondary | ICD-10-CM | POA: Diagnosis not present

## 2017-04-29 DIAGNOSIS — Z885 Allergy status to narcotic agent status: Secondary | ICD-10-CM | POA: Diagnosis not present

## 2017-04-29 DIAGNOSIS — N201 Calculus of ureter: Secondary | ICD-10-CM | POA: Diagnosis not present

## 2017-04-29 DIAGNOSIS — H409 Unspecified glaucoma: Secondary | ICD-10-CM | POA: Insufficient documentation

## 2017-04-29 DIAGNOSIS — Z79899 Other long term (current) drug therapy: Secondary | ICD-10-CM | POA: Insufficient documentation

## 2017-04-29 DIAGNOSIS — Z951 Presence of aortocoronary bypass graft: Secondary | ICD-10-CM | POA: Diagnosis not present

## 2017-04-29 HISTORY — PX: EXTRACORPOREAL SHOCK WAVE LITHOTRIPSY: SHX1557

## 2017-04-29 HISTORY — DX: Personal history of urinary calculi: Z87.442

## 2017-04-29 SURGERY — LITHOTRIPSY, ESWL
Anesthesia: LOCAL | Laterality: Right

## 2017-04-29 MED ORDER — LEVOFLOXACIN 500 MG PO TABS
500.0000 mg | ORAL_TABLET | ORAL | Status: AC
  Administered 2017-04-29: 500 mg via ORAL
  Filled 2017-04-29: qty 1

## 2017-04-29 MED ORDER — DIPHENHYDRAMINE HCL 25 MG PO CAPS
25.0000 mg | ORAL_CAPSULE | ORAL | Status: AC
  Administered 2017-04-29: 25 mg via ORAL
  Filled 2017-04-29: qty 1

## 2017-04-29 MED ORDER — TRAMADOL HCL 50 MG PO TABS: 50.0000 mg | ORAL_TABLET | Freq: Four times a day (QID) | ORAL | 0 refills | Status: DC | PRN

## 2017-04-29 MED ORDER — SODIUM CHLORIDE 0.9 % IV SOLN
INTRAVENOUS | Status: DC
  Administered 2017-04-29: 07:00:00 via INTRAVENOUS

## 2017-04-29 MED ORDER — DIAZEPAM 5 MG PO TABS
10.0000 mg | ORAL_TABLET | ORAL | Status: AC
  Administered 2017-04-29: 10 mg via ORAL
  Filled 2017-04-29: qty 2

## 2017-04-29 NOTE — H&P (Signed)
Donald [name] is an 84 y.o. male.    Chief Complaint: Pre-Op RIGHT Shockwave Lithotripsy  HPI:   1 - RIGHT UPJ Stone - 11mm Rt UPJ stone by CT 03/2017 on eval hematuria. Stone is solitary, 30mm, SSD 9cm, 820HU.  Today " Donald [name]" is seen to proceed with RIGHT shockwave lithotripsy. He stopped Eliquus as directed.   Past Medical History:  Diagnosis Date  . Asthma    with worsening with beta blockade  . CAD (coronary artery disease)   . CVA (cerebral infarction) 2012   tia, mild no residual defecits  . GI bleed 8 yrs ago   due to doll fully vessel which was clipped  . Glaucoma    excellent cataracts  . History of kidney stones   . Ischemic cardiomyopathy   . Nephrolithiasis   . Post-infarction apical thrombus ([place])   . Prostate cancer ([place]) 2012  . Radiation proctitis Feb 2015   treated with APC  . Tubular adenoma 04/2013   Dr. Hilarie Fredrickson    Past Surgical History:  Procedure Laterality Date  . cardiac stents  2003  . COLONOSCOPY WITH PROPOFOL N/A 05/05/2013   Procedure: COLONOSCOPY WITH PROPOFOL;  Surgeon: Jerene Bears, MD;  Location: [facility];  Service: Gastroenterology;  Laterality: N/A;  . CORONARY ARTERY BYPASS GRAFT  1998   LIMA to the LAD and saphenous vein graft tothe diagonal  . history of radiation treatment  2012   x 40 treatments done  . KNEE ARTHROSCOPY  2016 or 2017   Dr Gladstone Lighter ;   . PARTIAL KNEE ARTHROPLASTY Right 01/13/2017   Procedure: UNICOMPARTMENTAL RIGHT KNEE;  Surgeon: Gaynelle Arabian, MD;  Location: [facility];  Service: Orthopedics;  Laterality: Right;  with block    Family History  Problem Relation Age of Onset  . Heart attack Father   . Heart disease Mother   . Stomach cancer Mother    Social History:  reports that  has never smoked. he has never used smokeless tobacco. He reports that he drinks alcohol. He reports that he does not use drugs.  Allergies:  Allergies  Allergen Reactions  . Keflex [Cephalexin] Other (See Comments)    Nausea,  vomiting killed all GI enzymes, would not like to take  . Morphine Nausea Only    Medications Prior to Admission  Medication Sig Dispense Refill  . acetaminophen (TYLENOL) 500 MG tablet Take 500-1,000 mg by mouth every 8 (eight) hours as needed for mild pain or headache (1-2 tablets depending on pain).    Marland Kitchen albuterol (PROVENTIL HFA;VENTOLIN HFA) 108 (90 Base) MCG/ACT inhaler Inhale 1 puff into the lungs every 6 (six) hours as needed for wheezing or shortness of breath.    Marland Kitchen apixaban (ELIQUIS) 5 MG TABS tablet Take 1 tablet (5 mg total) by mouth 2 (two) times daily. 180 tablet 1  . Cholecalciferol (VITAMIN D) 2000 UNITS tablet Take 2,000 Units by mouth daily.    . fenofibrate micronized (LOFIBRA) 200 MG capsule Take 1 capsule (200 mg total) by mouth daily. 90 capsule 3  . Fluticasone Furoate-Vilanterol (BREO ELLIPTA) 100-25 MCG/INH AEPB Inhale 1 puff into the lungs daily as needed (shortness of breath).     . latanoprost (XALATAN) 0.005 % ophthalmic solution Place 1 drop into the left eye at bedtime.     Marland Kitchen loratadine (CLARITIN) 10 MG tablet Take 10 mg by mouth daily.    . methocarbamol (ROBAXIN) 500 MG tablet Take 1 tablet (500 mg total) by mouth every 6 (  six) hours as needed for muscle spasms. 80 tablet 0  . Multiple Vitamin (MULTIVITAMIN WITH MINERALS) TABS tablet Take 1 tablet by mouth daily.    Marland Kitchen oxyCODONE (OXY IR/ROXICODONE) 5 MG immediate release tablet Take 1-2 tablets (5-10 mg total) by mouth every 4 (four) hours as needed for moderate pain. 80 tablet 0  . pravastatin (PRAVACHOL) 40 MG tablet Take 1 tablet (40 mg total) by mouth at bedtime. 90 tablet 4  . ramipril (ALTACE) 2.5 MG capsule Take 1 capsule (2.5 mg total) by mouth daily. 90 capsule 2  . Tamsulosin HCl (FLOMAX) 0.4 MG CAPS Take 0.4 mg by mouth every evening.     . traMADol (ULTRAM) 50 MG tablet Take 1-2 tablets (50-100 mg total) by mouth every 6 (six) hours as needed for moderate pain. 56 tablet 0    No results found for this  or any previous visit (from the past 48 hour(s)). No results found.  Review of Systems  Constitutional: Negative.  Negative for chills and fever.  HENT: Negative.   Eyes: Negative.   Respiratory: Negative.   Cardiovascular: Negative.   Gastrointestinal: Negative.   Genitourinary: Positive for flank pain and hematuria.  Skin: Negative.   Neurological: Negative.   Endo/Heme/Allergies: Negative.   Psychiatric/Behavioral: Negative.     Height 5\' 10"  (1.778 m), weight 83.9 kg (185 lb). Physical Exam  Constitutional: He appears well-developed.  HENT:  Head: Normocephalic.  Eyes: Pupils are equal, round, and reactive to light.  Neck: Normal range of motion.  Cardiovascular: Normal rate.  Respiratory: Effort normal.  GI: Soft.  Genitourinary:  Genitourinary Comments: Minimal Rt CVAT at present  Musculoskeletal: Normal range of motion.  Neurological: He is alert.  Skin: Skin is warm.  Psychiatric: He has a normal mood and affect.     Assessment/Plan  1 - RIGHT UPJ Stone - proceed as planned with RIGHT shockwave lithotripsy. Risks, benefits, alternatives, expected peri-op course discussed previously and reiterated today.    Alexis Frock, MD 04/29/2017, 6:12 AM

## 2017-04-29 NOTE — Discharge Instructions (Signed)
Moderate Conscious Sedation, Adult, Care After These instructions provide you with information about caring for yourself after your procedure. Your health care provider may also give you more specific instructions. Your treatment has been planned according to current medical practices, but problems sometimes occur. Call your health care provider if you have any problems or questions after your procedure. What can I expect after the procedure? After your procedure, it is common:  To feel sleepy for several hours.  To feel clumsy and have poor balance for several hours.  To have poor judgment for several hours.  To vomit if you eat too soon.  Follow these instructions at home: For at least 24 hours after the procedure:   Do not: ? Participate in activities where you could fall or become injured. ? Drive. ? Use heavy machinery. ? Drink alcohol. ? Take sleeping pills or medicines that cause drowsiness. ? Make important decisions or sign legal documents. ? Take care of children on your own.  Rest. Eating and drinking  Follow the diet recommended by your health care provider.  If you vomit: ? Drink water, juice, or soup when you can drink without vomiting. ? Make sure you have little or no nausea before eating solid foods. General instructions  Have a responsible adult stay with you until you are awake and alert.  Take over-the-counter and prescription medicines only as told by your health care provider.  If you smoke, do not smoke without supervision.  Keep all follow-up visits as told by your health care provider. This is important. Contact a health care provider if:  You keep feeling nauseous or you keep vomiting.  You feel light-headed.  You develop a rash.  You have a fever. Get help right away if:  You have trouble breathing. This information is not intended to replace advice given to you by your health care provider. Make sure you discuss any questions you have  with your health care provider. Document Released: 01/04/2013 Document Revised: 08/19/2015 Document Reviewed: 07/06/2015 Elsevier Interactive Patient Education  2018 [place] After This sheet gives you information about how to care for yourself after your procedure. Your health care provider may also give you more specific instructions. If you have problems or questions, contact your health care provider. What can I expect after the procedure? After the procedure, it is common to have:  Some blood in your urine. This should only last for a few days.  Soreness in your back, sides, or upper abdomen for a few days.  Blotches or bruises on your back where the pressure wave entered the skin.  Pain, discomfort, or nausea when pieces (fragments) of the kidney stone move through the tube that carries urine from the kidney to the bladder (ureter). Stone fragments may pass soon after the procedure, but they may continue to pass for up to 4-8 weeks. ? If you have severe pain or nausea, contact your health care provider. This may be caused by a large stone that was not broken up, and this may mean that you need more treatment.  Some pain or discomfort during urination.  Some pain or discomfort in the lower abdomen or (in men) at the base of the penis.  Follow these instructions at home: Medicines  Take over-the-counter and prescription medicines only as told by your health care provider.  If you were prescribed an antibiotic medicine, take it as told by your health care provider. Do not stop taking the antibiotic even  if you start to feel better.  Do not drive for 24 hours if you were given a medicine to help you relax (sedative).  Do not drive or use heavy machinery while taking prescription pain medicine. Eating and drinking  Drink enough water and fluids to keep your urine clear or pale yellow. This helps any remaining pieces of the stone to pass. It can also help  prevent new stones from forming.  Eat plenty of fresh fruits and vegetables.  Follow instructions from your health care provider about eating and drinking restrictions. You may be instructed: ? To reduce how much salt (sodium) you eat or drink. Check ingredients and nutrition facts on packaged foods and beverages. ? To reduce how much meat you eat.  Eat the recommended amount of calcium for your age and gender. Ask your health care provider how much calcium you should have. General instructions  Get plenty of rest.  Most people can resume normal activities 1-2 days after the procedure. Ask your health care provider what activities are safe for you.  If directed, strain all urine through the strainer that was provided by your health care provider. ? Keep all fragments for your health care provider to see. Any stones that are found may be sent to a medical lab for examination. The stone may be as small as a grain of salt.  Keep all follow-up visits as told by your health care provider. This is important. Contact a health care provider if:  You have pain that is severe or does not get better with medicine.  You have nausea that is severe or does not go away.  You have blood in your urine longer than your health care provider told you to expect.  You have more blood in your urine.  You have pain during urination that does not go away.  You urinate more frequently than usual and this does not go away.  You develop a rash or any other possible signs of an allergic reaction. Get help right away if:  You have severe pain in your back, sides, or upper abdomen.  You have severe pain while urinating.  Your urine is very dark red.  You have blood in your stool (feces).  You cannot pass any urine at all.  You feel a strong urge to urinate after emptying your bladder.  You have a fever or chills.  You develop shortness of breath, difficulty breathing, or chest pain.  You have  severe nausea that leads to persistent vomiting.  You faint. Summary  After this procedure, it is common to have some pain, discomfort, or nausea when pieces (fragments) of the kidney stone move through the tube that carries urine from the kidney to the bladder (ureter). If this pain or nausea is severe, however, you should contact your health care provider.  Most people can resume normal activities 1-2 days after the procedure. Ask your health care provider what activities are safe for you.  Drink enough water and fluids to keep your urine clear or pale yellow. This helps any remaining pieces of the stone to pass, and it can help prevent new stones from forming.  If directed, strain your urine and keep all fragments for your health care provider to see. Fragments or stones may be as small as a grain of salt.  Get help right away if you have severe pain in your back, sides, or upper abdomen or have severe pain while urinating. This information is not intended to  replace advice given to you by your health care provider. Make sure you discuss any questions you have with your health care provider. Document Released: 04/05/2007 Document Revised: 02/05/2016 Document Reviewed: 02/05/2016 Elsevier Interactive Patient Education  2018 [place] may have urinary urgency (bladder spasms), pass small stone fragments, and bloody urine on / off x several days. This is normal.  2 - Restart Eliquus on Monday   3 - Call MD or go to ER for fever >102, severe pain / nausea / vomiting not relieved by medications, or acute change in medical status

## 2017-04-29 NOTE — Brief Op Note (Signed)
04/29/2017  8:38 AM  PATIENT:  Donald [name]  84 y.o. male  PRE-OPERATIVE DIAGNOSIS:  RIGHT RENAL STONE  POST-OPERATIVE DIAGNOSIS:  * No post-op diagnosis entered *  PROCEDURE:  Procedure(s): RIGHT EXTRACORPOREAL SHOCK WAVE LITHOTRIPSY (ESWL) (Right)  SURGEON:  Surgeon(s) and Role:    * Alexis Frock, MD - Primary  PHYSICIAN ASSISTANT:   ASSISTANTS: none   ANESTHESIA:   MAC  EBL: none  BLOOD ADMINISTERED:none  DRAINS: none   LOCAL MEDICATIONS USED:  NONE  SPECIMEN:  No Specimen  DISPOSITION OF SPECIMEN:  N/A  COUNTS:  YES  TOURNIQUET:  * No tourniquets in log *  DICTATION: .Note written in paper chart  PLAN OF CARE: Discharge to home after PACU  PATIENT DISPOSITION:  Short Stay   Delay start of Pharmacological VTE agent (>24hrs) due to surgical blood loss or risk of bleeding: yes

## 2017-04-29 NOTE — Telephone Encounter (Signed)
Patient called, needs clarification of when to re-start Eliqius after Lithotripsy this am.

## 2017-04-30 ENCOUNTER — Encounter (HOSPITAL_COMMUNITY): Payer: Self-pay | Admitting: Urology

## 2017-05-19 DIAGNOSIS — C4402 Squamous cell carcinoma of skin of lip: Secondary | ICD-10-CM | POA: Diagnosis not present

## 2017-05-20 DIAGNOSIS — N200 Calculus of kidney: Secondary | ICD-10-CM | POA: Diagnosis not present

## 2017-05-25 ENCOUNTER — Other Ambulatory Visit: Payer: Self-pay

## 2017-05-25 MED ORDER — FENOFIBRATE MICRONIZED 200 MG PO CAPS: 200.0000 mg | ORAL_CAPSULE | Freq: Every day | ORAL | 3 refills | Status: DC

## 2017-06-08 ENCOUNTER — Ambulatory Visit (HOSPITAL_BASED_OUTPATIENT_CLINIC_OR_DEPARTMENT_OTHER)
Admission: RE | Admit: 2017-06-08 | Discharge: 2017-06-08 | Disposition: A | Discharge status: Home / Self Care | Payer: Medicare Other | Source: Ambulatory Visit | Attending: Nurse Practitioner | Admitting: Nurse Practitioner

## 2017-06-08 ENCOUNTER — Encounter (HOSPITAL_COMMUNITY): Payer: Self-pay | Admitting: Nurse Practitioner

## 2017-06-08 ENCOUNTER — Telehealth: Payer: Self-pay | Admitting: Cardiology

## 2017-06-08 VITALS — BP 116/82 | HR 120 | Ht 69.0 in | Wt 197.0 lb

## 2017-06-08 DIAGNOSIS — H409 Unspecified glaucoma: Secondary | ICD-10-CM

## 2017-06-08 DIAGNOSIS — I481 Persistent atrial fibrillation: Secondary | ICD-10-CM

## 2017-06-08 DIAGNOSIS — I4510 Unspecified right bundle-branch block: Secondary | ICD-10-CM

## 2017-06-08 DIAGNOSIS — I255 Ischemic cardiomyopathy: Secondary | ICD-10-CM

## 2017-06-08 DIAGNOSIS — J90 Pleural effusion, not elsewhere classified: Secondary | ICD-10-CM | POA: Diagnosis not present

## 2017-06-08 DIAGNOSIS — J45909 Unspecified asthma, uncomplicated: Secondary | ICD-10-CM

## 2017-06-08 DIAGNOSIS — R0602 Shortness of breath: Secondary | ICD-10-CM | POA: Diagnosis not present

## 2017-06-08 DIAGNOSIS — R188 Other ascites: Secondary | ICD-10-CM | POA: Diagnosis not present

## 2017-06-08 DIAGNOSIS — I5041 Acute combined systolic (congestive) and diastolic (congestive) heart failure: Secondary | ICD-10-CM | POA: Diagnosis not present

## 2017-06-08 DIAGNOSIS — N179 Acute kidney failure, unspecified: Secondary | ICD-10-CM | POA: Diagnosis not present

## 2017-06-08 DIAGNOSIS — I509 Heart failure, unspecified: Secondary | ICD-10-CM | POA: Diagnosis not present

## 2017-06-08 DIAGNOSIS — Z87442 Personal history of urinary calculi: Secondary | ICD-10-CM

## 2017-06-08 DIAGNOSIS — Z8673 Personal history of transient ischemic attack (TIA), and cerebral infarction without residual deficits: Secondary | ICD-10-CM

## 2017-06-08 DIAGNOSIS — Z8546 Personal history of malignant neoplasm of prostate: Secondary | ICD-10-CM | POA: Insufficient documentation

## 2017-06-08 DIAGNOSIS — I4891 Unspecified atrial fibrillation: Secondary | ICD-10-CM

## 2017-06-08 DIAGNOSIS — I4819 Other persistent atrial fibrillation: Secondary | ICD-10-CM

## 2017-06-08 DIAGNOSIS — Z79899 Other long term (current) drug therapy: Secondary | ICD-10-CM

## 2017-06-08 DIAGNOSIS — Z885 Allergy status to narcotic agent status: Secondary | ICD-10-CM | POA: Insufficient documentation

## 2017-06-08 DIAGNOSIS — Z881 Allergy status to other antibiotic agents status: Secondary | ICD-10-CM | POA: Insufficient documentation

## 2017-06-08 DIAGNOSIS — I480 Paroxysmal atrial fibrillation: Secondary | ICD-10-CM | POA: Diagnosis not present

## 2017-06-08 DIAGNOSIS — I2510 Atherosclerotic heart disease of native coronary artery without angina pectoris: Secondary | ICD-10-CM

## 2017-06-08 DIAGNOSIS — Z7901 Long term (current) use of anticoagulants: Secondary | ICD-10-CM | POA: Insufficient documentation

## 2017-06-08 DIAGNOSIS — K7031 Alcoholic cirrhosis of liver with ascites: Secondary | ICD-10-CM | POA: Diagnosis not present

## 2017-06-08 DIAGNOSIS — E7800 Pure hypercholesterolemia, unspecified: Secondary | ICD-10-CM | POA: Diagnosis not present

## 2017-06-08 LAB — CBC
HCT: 39.5 % (ref 39.0–52.0)
HEMOGLOBIN: 12.6 g/dL — AB (ref 13.0–17.0)
MCH: 31.2 pg (ref 26.0–34.0)
MCHC: 31.9 g/dL (ref 30.0–36.0)
MCV: 97.8 fL (ref 78.0–100.0)
Platelets: 214 10*3/uL (ref 150–400)
RBC: 4.04 MIL/uL — AB (ref 4.22–5.81)
RDW: 14.4 % (ref 11.5–15.5)
WBC: 4.5 10*3/uL (ref 4.0–10.5)

## 2017-06-08 LAB — COMPREHENSIVE METABOLIC PANEL
ALBUMIN: 3.5 g/dL (ref 3.5–5.0)
ALK PHOS: 34 U/L — AB (ref 38–126)
ALT: 16 U/L — AB (ref 17–63)
AST: 24 U/L (ref 15–41)
Anion gap: 7 (ref 5–15)
BUN: 17 mg/dL (ref 6–20)
CALCIUM: 9.1 mg/dL (ref 8.9–10.3)
CO2: 25 mmol/L (ref 22–32)
CREATININE: 0.84 mg/dL (ref 0.61–1.24)
Chloride: 110 mmol/L (ref 101–111)
GFR calc Af Amer: >60 mL/min (ref >60)
GFR calc non Af Amer: >60 mL/min (ref >60)
GLUCOSE: 91 mg/dL (ref 65–99)
Potassium: 4.1 mmol/L (ref 3.5–5.1)
SODIUM: 142 mmol/L (ref 135–145)
Total Bilirubin: 0.7 mg/dL (ref 0.3–1.2)
Total Protein: 6 g/dL — ABNORMAL LOW (ref 6.5–8.1)

## 2017-06-08 LAB — TSH: TSH: 6.106 u[IU]/mL — ABNORMAL HIGH (ref 0.350–4.500)

## 2017-06-08 MED ORDER — BISOPROLOL FUMARATE 5 MG PO TABS: 5.0000 mg | ORAL_TABLET | Freq: Every day | ORAL | 6 refills | Status: DC

## 2017-06-08 NOTE — Telephone Encounter (Signed)
Patient c/o that in the last couple of weeks, he has been getting more SOB with little exercise. Uses inhalers for asthma and says that they aren't helping his sob. Patient said he has not checked his BP or HR. No c/o chest pain, palpitations or dizziness. Patient asked to check HR and BP while on the phone with nurse. Medication reconciled while on the phone with patient.  HR-118 & BP-138/81  Contacted a-fib clinic and scheduled appointment for today @2 :30 pm with Roderic Palau. Patient informed and verbalized understanding of plan. (9002).  Appointment with Purcell Nails canceled.

## 2017-06-08 NOTE — Progress Notes (Signed)
Primary Care Physician: [name], [name], MD Referring Physician: Dr. [name] Cardiologist: Dr. Caprice Renshaw is a 84 y.o. male with a h/o asthma, CAD,CVA, that is in the afib clinic today for evulation of shortness of breath. Pt has been suspected of having afib in the past, but has has never been documented. He called  Dr. Jacalyn Lefevre office with c/o of shortness of breath and referred here.  His EKG shows afib at 130 bpm, RBBB, qrs int 136 ms, qtc 503 ms.  For his age he is highly functioning, not aware of the rapid HR, just the shortness of breath. He has been noticing these symptoms for about one month. He is already on eliquis 5 mg bid, chadsvasc score of at least 4. His weight was 180 lbs in a hospital gown when he had lithotripsy 1/31. Today it is 197, fully dressed. He admits to eating more and gaining 30 lbs over the last year. Most of the shortness of breath is with exertion.  No excessive caffeine, no alcohol, tobacco, denies snoring.  Today, he denies symptoms of palpitations, chest pain, shortness of breath, orthopnea, PND, lower extremity edema, dizziness, presyncope, syncope, or neurologic sequela. The patient is tolerating medications without difficulties and is otherwise without complaint today.   Past Medical History:  Diagnosis Date  . Asthma    with worsening with beta blockade  . CAD (coronary artery disease)   . CVA (cerebral infarction) 2012   tia, mild no residual defecits  . GI bleed 8 yrs ago   due to doll fully vessel which was clipped  . Glaucoma    excellent cataracts  . History of kidney stones   . Ischemic cardiomyopathy   . Nephrolithiasis   . 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 Prostate cancer ([place]) 2012  . Radiation proctitis Feb 2015   treated with APC  . Tubular adenoma 04/2013   Dr. Hilarie Fredrickson   Past Surgical History:  Procedure Laterality Date  . cardiac stents  2003  . COLONOSCOPY WITH PROPOFOL N/A 05/05/2013   Procedure:  COLONOSCOPY WITH PROPOFOL;  Surgeon: Jerene Bears, MD;  Location: [facility];  Service: Gastroenterology;  Laterality: N/A;  . CORONARY ARTERY BYPASS GRAFT  1998   LIMA to the LAD and saphenous vein graft tothe diagonal  . EXTRACORPOREAL SHOCK WAVE LITHOTRIPSY Right 04/29/2017   Procedure: RIGHT EXTRACORPOREAL SHOCK WAVE LITHOTRIPSY (ESWL);  Surgeon: Alexis Frock, MD;  Location: [facility];  Service: Urology;  Laterality: Right;  . history of radiation treatment  2012   x 40 treatments done  . KNEE ARTHROSCOPY  2016 or 2017   Dr [place] Lighter ;   . PARTIAL KNEE ARTHROPLASTY Right 01/13/2017   Procedure: UNICOMPARTMENTAL RIGHT KNEE;  Surgeon: Gaynelle Arabian, MD;  Location: [facility];  Service: Orthopedics;  Laterality: Right;  with block    Current Outpatient Medications  Medication Sig Dispense Refill  . acetaminophen (TYLENOL) 500 MG tablet Take 500-1,000 mg by mouth every 8 (eight) hours as needed for mild pain or headache (1-2 tablets depending on pain).    Marland Kitchen albuterol (PROVENTIL HFA;VENTOLIN HFA) 108 (90 Base) MCG/ACT inhaler Inhale 1 puff into the lungs every 6 (six) hours as needed for wheezing or shortness of breath.    Marland Kitchen apixaban (ELIQUIS) 5 MG TABS tablet Take 5 mg by mouth 2 (two) times daily.    . Cholecalciferol (VITAMIN D) 2000 UNITS tablet Take 2,000 Units by mouth daily.    . fenofibrate  micronized (LOFIBRA) 200 MG capsule Take 1 capsule (200 mg total) by mouth daily. 90 capsule 3  . Fluticasone Furoate-Vilanterol (BREO ELLIPTA) 100-25 MCG/INH AEPB Inhale 1 puff into the lungs daily as needed (shortness of breath).     . latanoprost (XALATAN) 0.005 % ophthalmic solution Place 1 drop into the left eye at bedtime.     Marland Kitchen loratadine (CLARITIN) 10 MG tablet Take 10 mg by mouth daily.    . Multiple Vitamin (MULTIVITAMIN WITH MINERALS) TABS tablet Take 1 tablet by mouth daily.    . pravastatin (PRAVACHOL) 40 MG tablet Take 1 tablet (40 mg total) by mouth at bedtime. 90 tablet 4  . ramipril  (ALTACE) 2.5 MG capsule Take 1 capsule (2.5 mg total) by mouth daily. 90 capsule 2  . Tamsulosin HCl (FLOMAX) 0.4 MG CAPS Take 0.4 mg by mouth every evening.     . bisoprolol (ZEBETA) 5 MG tablet Take 1 tablet (5 mg total) by mouth daily. 30 tablet 6   No current facility-administered medications for this encounter.     Allergies  Allergen Reactions  . Keflex [Cephalexin] Other (See Comments)    Nausea, vomiting killed all GI enzymes, would not like to take  . Morphine Nausea Only    Social History   Socioeconomic History  . Marital status: Widowed    Spouse name: Not on file  . Number of children: 3  . Years of education: Not on file  . Highest education level: Not on file  Social Needs  . Financial resource strain: Not on file  . Food insecurity - worry: Not on file  . Food insecurity - inability: Not on file  . Transportation needs - medical: Not on file  . Transportation needs - non-medical: Not on file  Occupational History  . Occupation: Retired Conservation officer, [employer]  Tobacco Use  . Smoking status: Never Smoker  . Smokeless tobacco: Never Used  Substance and Sexual Activity  . Alcohol use: Yes    Alcohol/week: 0.0 oz    Comment: occasional beer or wine  . Drug use: No  . Sexual activity: Not on file  Other Topics Concern  . Not on file  Social History Narrative  . Not on file    Family History  Problem Relation Age of Onset  . Heart attack Father   . Heart disease Mother   . Stomach cancer Mother     ROS- All systems are reviewed and negative except as per the HPI above  Physical Exam: Vitals:   06/08/17 1341  BP: 116/82  Pulse: (!) 120  SpO2: 96%  Weight: 197 lb (89.4 kg)  Height: 5\' 9"  (1.753 m)   Wt Readings from Last 3 Encounters:  06/08/17 197 lb (89.4 kg)  04/29/17 180 lb 5 oz (81.8 kg)  01/13/17 189 lb (85.7 kg)    Labs: Lab Results  Component Value Date   NA 139 01/14/2017   K 3.8 01/14/2017   CL 110 01/14/2017   CO2 23 01/14/2017     GLUCOSE 130 (H) 01/14/2017   BUN 22 (H) 01/14/2017   CREATININE 0.79 01/14/2017   CALCIUM 8.7 (L) 01/14/2017   Lab Results  Component Value Date   INR 1.16 01/06/2017   Lab Results  Component Value Date   CHOL 114 11/30/2012   HDL 47.00 11/30/2012   LDLCALC 56 11/30/2012   TRIG 57.0 11/30/2012     GEN- The patient is well appearing, alert and oriented x 3 today.   Head-  normocephalic, atraumatic Eyes-  Sclera clear, conjunctiva pink Ears- hearing intact Oropharynx- clear Neck- supple, no JVP Lymph- no cervical lymphadenopathy Lungs- Clear to ausculation bilaterally, normal work of breathing Heart- Rapid, irregular rate and rhythm, no murmurs, rubs or gallops, PMI not laterally displaced GI- soft, NT, ND, + BS Extremities- no clubbing, cyanosis, or trace edema MS- no significant deformity or atrophy Skin- no rash or lesion Psych- euthymic mood, full affect Neuro- strength and sensation are intact  EKG-afib at 120 bpm, qrs int 136 ms, qtc 503 ms Epic records reviewed    Assessment and Plan: 1. afib As for now I am not sure if paroxysmal or persistent  Pt feels as it is paroxysmal General education re afib Triggers discussed He has had worsening of asthma with  BB Will try bisoprolol, as not to aggravate asthma, to start at  5 mg qd for initial  rate control  Update echo Cbc/tsh/cmet  2. Chadsvasc score of at least 4 Continue eliquis 5 mg bid  3. Shortness of breath I do not see a lot of fluid so will not start a fluid pill at this time, but will pay close attention to this going forward  4. Asthma He states stable usually does not have to use inhaler  F/u here on Thursday  Makenzy Krist C. Johaan Ryser, [facility] [address] [place], [place] [address] [phone]

## 2017-06-08 NOTE — Telephone Encounter (Signed)
New Message:     Pt have been having shortness of breath,no other symptoms. I made him an appt for Thursday with Jory Sims.Please call to evaluate.

## 2017-06-08 NOTE — Patient Instructions (Signed)
Your physician has recommended you make the following change in your medication:  1)Bisoprolol 5mg   Once a day

## 2017-06-09 DIAGNOSIS — R946 Abnormal results of thyroid function studies: Secondary | ICD-10-CM | POA: Diagnosis not present

## 2017-06-09 NOTE — Addendum Note (Signed)
Encounter addended by: Sherran Needs, NP on: 06/09/2017 9:04 AM  Actions taken: Result note filed

## 2017-06-10 ENCOUNTER — Encounter (HOSPITAL_COMMUNITY): Payer: Self-pay | Admitting: *Deleted

## 2017-06-10 ENCOUNTER — Other Ambulatory Visit: Payer: Self-pay

## 2017-06-10 ENCOUNTER — Emergency Department (HOSPITAL_COMMUNITY): Payer: Medicare Other

## 2017-06-10 ENCOUNTER — Encounter (HOSPITAL_COMMUNITY): Payer: Self-pay | Admitting: Nurse Practitioner

## 2017-06-10 ENCOUNTER — Ambulatory Visit (HOSPITAL_COMMUNITY)
Admission: RE | Admit: 2017-06-10 | Discharge: 2017-06-10 | Disposition: A | Discharge status: Home / Self Care | Payer: Medicare Other | Source: Ambulatory Visit | Attending: Nurse Practitioner | Admitting: Nurse Practitioner

## 2017-06-10 ENCOUNTER — Ambulatory Visit: Payer: Medicare Other | Admitting: Adult Health

## 2017-06-10 ENCOUNTER — Inpatient Hospital Stay (HOSPITAL_COMMUNITY)
Admission: EM | Admit: 2017-06-10 | Discharge: 2017-06-16 | DRG: 292 | Disposition: A | Discharge status: Home Health | Payer: Medicare Other | Attending: Internal Medicine | Admitting: Internal Medicine

## 2017-06-10 ENCOUNTER — Ambulatory Visit (HOSPITAL_BASED_OUTPATIENT_CLINIC_OR_DEPARTMENT_OTHER)
Admission: RE | Admit: 2017-06-10 | Discharge: 2017-06-10 | Disposition: A | Discharge status: Home / Self Care | Payer: Medicare Other | Source: Ambulatory Visit | Attending: Nurse Practitioner | Admitting: Nurse Practitioner

## 2017-06-10 VITALS — BP 108/72 | HR 98 | Ht 69.0 in | Wt 202.4 lb

## 2017-06-10 DIAGNOSIS — Z885 Allergy status to narcotic agent status: Secondary | ICD-10-CM | POA: Insufficient documentation

## 2017-06-10 DIAGNOSIS — I509 Heart failure, unspecified: Secondary | ICD-10-CM | POA: Diagnosis not present

## 2017-06-10 DIAGNOSIS — J45909 Unspecified asthma, uncomplicated: Secondary | ICD-10-CM | POA: Insufficient documentation

## 2017-06-10 DIAGNOSIS — Z951 Presence of aortocoronary bypass graft: Secondary | ICD-10-CM

## 2017-06-10 DIAGNOSIS — I4510 Unspecified right bundle-branch block: Secondary | ICD-10-CM | POA: Insufficient documentation

## 2017-06-10 DIAGNOSIS — Z79899 Other long term (current) drug therapy: Secondary | ICD-10-CM

## 2017-06-10 DIAGNOSIS — R600 Localized edema: Secondary | ICD-10-CM

## 2017-06-10 DIAGNOSIS — Z87442 Personal history of urinary calculi: Secondary | ICD-10-CM

## 2017-06-10 DIAGNOSIS — Z955 Presence of coronary angioplasty implant and graft: Secondary | ICD-10-CM

## 2017-06-10 DIAGNOSIS — Z8673 Personal history of transient ischemic attack (TIA), and cerebral infarction without residual deficits: Secondary | ICD-10-CM | POA: Insufficient documentation

## 2017-06-10 DIAGNOSIS — I481 Persistent atrial fibrillation: Secondary | ICD-10-CM

## 2017-06-10 DIAGNOSIS — J449 Chronic obstructive pulmonary disease, unspecified: Secondary | ICD-10-CM | POA: Diagnosis present

## 2017-06-10 DIAGNOSIS — Z7901 Long term (current) use of anticoagulants: Secondary | ICD-10-CM

## 2017-06-10 DIAGNOSIS — I4891 Unspecified atrial fibrillation: Secondary | ICD-10-CM

## 2017-06-10 DIAGNOSIS — I480 Paroxysmal atrial fibrillation: Secondary | ICD-10-CM | POA: Diagnosis not present

## 2017-06-10 DIAGNOSIS — K7031 Alcoholic cirrhosis of liver with ascites: Secondary | ICD-10-CM | POA: Diagnosis not present

## 2017-06-10 DIAGNOSIS — R0600 Dyspnea, unspecified: Secondary | ICD-10-CM | POA: Diagnosis not present

## 2017-06-10 DIAGNOSIS — I517 Cardiomegaly: Secondary | ICD-10-CM | POA: Insufficient documentation

## 2017-06-10 DIAGNOSIS — R0602 Shortness of breath: Secondary | ICD-10-CM

## 2017-06-10 DIAGNOSIS — Z96651 Presence of right artificial knee joint: Secondary | ICD-10-CM | POA: Diagnosis present

## 2017-06-10 DIAGNOSIS — N400 Enlarged prostate without lower urinary tract symptoms: Secondary | ICD-10-CM | POA: Diagnosis present

## 2017-06-10 DIAGNOSIS — I272 Pulmonary hypertension, unspecified: Secondary | ICD-10-CM | POA: Diagnosis present

## 2017-06-10 DIAGNOSIS — J90 Pleural effusion, not elsewhere classified: Secondary | ICD-10-CM

## 2017-06-10 DIAGNOSIS — N179 Acute kidney failure, unspecified: Secondary | ICD-10-CM | POA: Diagnosis not present

## 2017-06-10 DIAGNOSIS — E785 Hyperlipidemia, unspecified: Secondary | ICD-10-CM | POA: Diagnosis present

## 2017-06-10 DIAGNOSIS — H409 Unspecified glaucoma: Secondary | ICD-10-CM | POA: Diagnosis not present

## 2017-06-10 DIAGNOSIS — T501X5A Adverse effect of loop [high-ceiling] diuretics, initial encounter: Secondary | ICD-10-CM | POA: Diagnosis present

## 2017-06-10 DIAGNOSIS — I5041 Acute combined systolic (congestive) and diastolic (congestive) heart failure: Secondary | ICD-10-CM | POA: Diagnosis not present

## 2017-06-10 DIAGNOSIS — H269 Unspecified cataract: Secondary | ICD-10-CM | POA: Diagnosis present

## 2017-06-10 DIAGNOSIS — I2510 Atherosclerotic heart disease of native coronary artery without angina pectoris: Secondary | ICD-10-CM | POA: Diagnosis present

## 2017-06-10 DIAGNOSIS — Z881 Allergy status to other antibiotic agents status: Secondary | ICD-10-CM | POA: Insufficient documentation

## 2017-06-10 DIAGNOSIS — Z8546 Personal history of malignant neoplasm of prostate: Secondary | ICD-10-CM | POA: Diagnosis not present

## 2017-06-10 DIAGNOSIS — Z66 Do not resuscitate: Secondary | ICD-10-CM | POA: Diagnosis present

## 2017-06-10 DIAGNOSIS — I255 Ischemic cardiomyopathy: Secondary | ICD-10-CM | POA: Insufficient documentation

## 2017-06-10 DIAGNOSIS — E7800 Pure hypercholesterolemia, unspecified: Secondary | ICD-10-CM | POA: Diagnosis present

## 2017-06-10 DIAGNOSIS — Z8249 Family history of ischemic heart disease and other diseases of the circulatory system: Secondary | ICD-10-CM

## 2017-06-10 DIAGNOSIS — R188 Other ascites: Secondary | ICD-10-CM | POA: Diagnosis present

## 2017-06-10 DIAGNOSIS — Z800 Family history of malignant neoplasm of digestive organs: Secondary | ICD-10-CM

## 2017-06-10 DIAGNOSIS — Z923 Personal history of irradiation: Secondary | ICD-10-CM

## 2017-06-10 DIAGNOSIS — I340 Nonrheumatic mitral (valve) insufficiency: Secondary | ICD-10-CM | POA: Diagnosis present

## 2017-06-10 DIAGNOSIS — D649 Anemia, unspecified: Secondary | ICD-10-CM | POA: Diagnosis present

## 2017-06-10 DIAGNOSIS — N189 Chronic kidney disease, unspecified: Secondary | ICD-10-CM | POA: Diagnosis present

## 2017-06-10 DIAGNOSIS — I4819 Other persistent atrial fibrillation: Secondary | ICD-10-CM

## 2017-06-10 HISTORY — DX: Nonrheumatic mitral (valve) insufficiency: I34.0

## 2017-06-10 LAB — BASIC METABOLIC PANEL
ANION GAP: 10 (ref 5–15)
BUN: 32 mg/dL — AB (ref 6–20)
CALCIUM: 9.5 mg/dL (ref 8.9–10.3)
CO2: 22 mmol/L (ref 22–32)
Chloride: 107 mmol/L (ref 101–111)
Creatinine, Ser: 1.29 mg/dL — ABNORMAL HIGH (ref 0.61–1.24)
GFR calc Af Amer: 55 mL/min — ABNORMAL LOW (ref >60)
GFR, EST NON AFRICAN AMERICAN: 48 mL/min — AB (ref >60)
GLUCOSE: 137 mg/dL — AB (ref 65–99)
POTASSIUM: 4.4 mmol/L (ref 3.5–5.1)
SODIUM: 139 mmol/L (ref 135–145)

## 2017-06-10 LAB — CBC
HEMATOCRIT: 37.9 % — AB (ref 39.0–52.0)
Hemoglobin: 12.1 g/dL — ABNORMAL LOW (ref 13.0–17.0)
MCH: 30.5 pg (ref 26.0–34.0)
MCHC: 31.9 g/dL (ref 30.0–36.0)
MCV: 95.5 fL (ref 78.0–100.0)
Platelets: 210 10*3/uL (ref 150–400)
RBC: 3.97 MIL/uL — ABNORMAL LOW (ref 4.22–5.81)
RDW: 14.2 % (ref 11.5–15.5)
WBC: 5.9 10*3/uL (ref 4.0–10.5)

## 2017-06-10 LAB — I-STAT TROPONIN, ED: Troponin i, poc: 0.01 ng/mL (ref 0.00–0.08)

## 2017-06-10 LAB — LIPASE, BLOOD: LIPASE: 33 U/L (ref 11–51)

## 2017-06-10 LAB — BRAIN NATRIURETIC PEPTIDE: B Natriuretic Peptide: 930.7 pg/mL — ABNORMAL HIGH (ref 0.0–100.0)

## 2017-06-10 MED ORDER — POTASSIUM CHLORIDE ER 10 MEQ PO TBCR: 10.0000 meq | EXTENDED_RELEASE_TABLET | Freq: Every day | ORAL | 3 refills | Status: DC

## 2017-06-10 MED ORDER — IOPAMIDOL (ISOVUE-300) INJECTION 61%
80.0000 mL | Freq: Once | INTRAVENOUS | Status: AC | PRN
  Administered 2017-06-10: 100 mL via INTRAVENOUS

## 2017-06-10 MED ORDER — FUROSEMIDE 20 MG PO TABS: ORAL_TABLET | ORAL | 1 refills | Status: DC

## 2017-06-10 MED ORDER — FUROSEMIDE 20 MG PO TABS: ORAL_TABLET | ORAL | 0 refills | Status: DC

## 2017-06-10 MED ORDER — FUROSEMIDE 20 MG PO TABS
20.0000 mg | ORAL_TABLET | Freq: Once | ORAL | Status: AC
  Administered 2017-06-10: 20 mg via ORAL
  Filled 2017-06-10: qty 1

## 2017-06-10 MED ORDER — FUROSEMIDE 20 MG PO TABS: 20.0000 mg | ORAL_TABLET | ORAL | 1 refills | Status: DC

## 2017-06-10 NOTE — Addendum Note (Signed)
Encounter addended by: Juluis Mire, RN on: 06/10/2017 2:40 PM  Actions taken: Pharmacy for encounter modified, Order list changed

## 2017-06-10 NOTE — Progress Notes (Signed)
Primary Care Physician: Donald [name], Donald Him, MD Referring Physician: Dr. [name] Cardiologist: Dr. Caprice Renshaw is a 84 y.o. male with a h/o asthma, CAD,CVA, that is in the afib clinic today for evulation of shortness of breath. Pt has been suspected of having afib in the past, but has has never been documented. He called  Dr. Jacalyn Lefevre office with c/o of shortness of breath and referred here.  His EKG shows afib at 130 bpm, RBBB, qrs int 136 ms, qtc 503 ms.   For his age he is highly functioning, not aware of the rapid HR, just the shortness of breath. He has been noticing these symptoms for about one month. He is already on eliquis 5 mg bid, chadsvasc score of at least 4. His weight was 180 lbs in a hospital gown when he had lithotripsy 1/31. Today it is 197, fully dressed. He admits to eating more and gaining 30 lbs over the last year. Most of the shortness of breath is with exertion.  No excessive caffeine, no alcohol, tobacco, denies snoring.   F/u in afib clinic after starting bisoprolol 5 mg daily. His HR is better controlled now. He was not sure on last visit if he was persistent or paroxysmal but with his symptoms of exertional shortness of breath  and additional weight gain, I feel he is persistent. He has not missed any doses of eliquis for at least 3 weeks so will plan for cardioversion. Will plan on CXR today and starting diuretics.   Today, he denies symptoms of palpitations, chest pain, shortness of breath, orthopnea, PND, lower extremity edema, dizziness, presyncope, syncope, or neurologic sequela. The patient is tolerating medications without difficulties and is otherwise without complaint today.   Past Medical History:  Diagnosis Date  . Asthma    with worsening with beta blockade  . CAD (coronary artery disease)   . CVA (cerebral infarction) 2012   tia, mild no residual defecits  . GI bleed 8 yrs ago   due to doll fully vessel which was clipped  . Glaucoma     excellent cataracts  . History of kidney stones   . Ischemic cardiomyopathy   . Nephrolithiasis   . Post-infarction apical thrombus ([place])   . Prostate cancer ([place]) 2012  . Radiation proctitis Feb 2015   treated with APC  . Tubular adenoma 04/2013   Dr. Hilarie Fredrickson   Past Surgical History:  Procedure Laterality Date  . cardiac stents  2003  . COLONOSCOPY WITH PROPOFOL N/A 05/05/2013   Procedure: COLONOSCOPY WITH PROPOFOL;  Surgeon: Jerene Bears, MD;  Location: [facility];  Service: Gastroenterology;  Laterality: N/A;  . CORONARY ARTERY BYPASS GRAFT  1998   LIMA to the LAD and saphenous vein graft tothe diagonal  . EXTRACORPOREAL SHOCK WAVE LITHOTRIPSY Right 04/29/2017   Procedure: RIGHT EXTRACORPOREAL SHOCK WAVE LITHOTRIPSY (ESWL);  Surgeon: Alexis Frock, MD;  Location: [facility];  Service: Urology;  Laterality: Right;  . history of radiation treatment  2012   x 40 treatments done  . KNEE ARTHROSCOPY  2016 or 2017   Dr Gladstone Lighter ;   . PARTIAL KNEE ARTHROPLASTY Right 01/13/2017   Procedure: UNICOMPARTMENTAL RIGHT KNEE;  Surgeon: Gaynelle Arabian, MD;  Location: [facility];  Service: Orthopedics;  Laterality: Right;  with block    Current Outpatient Medications  Medication Sig Dispense Refill  . acetaminophen (TYLENOL) 500 MG tablet Take 500-1,000 mg by mouth every 8 (eight) hours as needed for mild pain  or headache (1-2 tablets depending on pain).    Marland Kitchen albuterol (PROVENTIL HFA;VENTOLIN HFA) 108 (90 Base) MCG/ACT inhaler Inhale 1 puff into the lungs every 6 (six) hours as needed for wheezing or shortness of breath.    Marland Kitchen apixaban (ELIQUIS) 5 MG TABS tablet Take 5 mg by mouth 2 (two) times daily.    . bisoprolol (ZEBETA) 5 MG tablet Take 1 tablet (5 mg total) by mouth daily. 30 tablet 6  . Cholecalciferol (VITAMIN D) 2000 UNITS tablet Take 2,000 Units by mouth daily.    . fenofibrate micronized (LOFIBRA) 200 MG capsule Take 1 capsule (200 mg total) by mouth daily. 90 capsule 3  . Fluticasone  Furoate-Vilanterol (BREO ELLIPTA) 100-25 MCG/INH AEPB Inhale 1 puff into the lungs daily as needed (shortness of breath).     . latanoprost (XALATAN) 0.005 % ophthalmic solution Place 1 drop into the left eye at bedtime.     Marland Kitchen loratadine (CLARITIN) 10 MG tablet Take 10 mg by mouth daily.    . Multiple Vitamin (MULTIVITAMIN WITH MINERALS) TABS tablet Take 1 tablet by mouth daily.    . pravastatin (PRAVACHOL) 40 MG tablet Take 1 tablet (40 mg total) by mouth at bedtime. 90 tablet 4  . ramipril (ALTACE) 2.5 MG capsule Take 1 capsule (2.5 mg total) by mouth daily. 90 capsule 2  . Tamsulosin HCl (FLOMAX) 0.4 MG CAPS Take 0.4 mg by mouth every evening.     . furosemide (LASIX) 20 MG tablet Take one tablet twice a day for 3 days, then take one tablet once a day thereafter 45 tablet 0  . potassium chloride (K-DUR) 10 MEQ tablet Take 1 tablet (10 mEq total) by mouth daily. 30 tablet 3   No current facility-administered medications for this encounter.     Allergies  Allergen Reactions  . Keflex [Cephalexin] Other (See Comments)    Nausea, vomiting killed all GI enzymes, would not like to take  . Morphine Nausea Only    Social History   Socioeconomic History  . Marital status: Widowed    Spouse name: Not on file  . Number of children: 3  . Years of education: Not on file  . Highest education level: Not on file  Social Needs  . Financial resource strain: Not on file  . Food insecurity - worry: Not on file  . Food insecurity - inability: Not on file  . Transportation needs - medical: Not on file  . Transportation needs - non-medical: Not on file  Occupational History  . Occupation: Retired Conservation officer, [employer]  Tobacco Use  . Smoking status: Never Smoker  . Smokeless tobacco: Never Used  Substance and Sexual Activity  . Alcohol use: Yes    Alcohol/week: 0.0 oz    Comment: occasional beer or wine  . Drug use: No  . Sexual activity: Not on file  Other Topics Concern  . Not on file    Social History Narrative  . Not on file    Family History  Problem Relation Age of Onset  . Heart attack Father   . Heart disease Mother   . Stomach cancer Mother     ROS- All systems are reviewed and negative except as per the HPI above  Physical Exam: Vitals:   06/10/17 0931  BP: 108/72  Pulse: 98  SpO2: 98%  Weight: 202 lb 6.4 oz (91.8 kg)  Height: 5\' 9"  (1.753 m)   Wt Readings from Last 3 Encounters:  06/10/17 202 lb 6.4 oz (91.8  kg)  06/08/17 197 lb (89.4 kg)  04/29/17 180 lb 5 oz (81.8 kg)    Labs: Lab Results  Component Value Date   NA 142 06/08/2017   K 4.1 06/08/2017   CL 110 06/08/2017   CO2 25 06/08/2017   GLUCOSE 91 06/08/2017   BUN 17 06/08/2017   CREATININE 0.84 06/08/2017   CALCIUM 9.1 06/08/2017   Lab Results  Component Value Date   INR 1.16 01/06/2017   Lab Results  Component Value Date   CHOL 114 11/30/2012   HDL 47.00 11/30/2012   LDLCALC 56 11/30/2012   TRIG 57.0 11/30/2012     GEN- The patient is well appearing, alert and oriented x 3 today.   Head- normocephalic, atraumatic Eyes-  Sclera clear, conjunctiva pink Ears- hearing intact Oropharynx- clear Neck- supple, no JVP Lymph- no cervical lymphadenopathy Lungs- Clear to ausculation bilaterally, normal work of breathing Heart- Rapid, irregular rate and rhythm, no murmurs, rubs or gallops, PMI not laterally displaced GI- soft, NT, ND, + BS Extremities- no clubbing, cyanosis, or trace edema MS- no significant deformity or atrophy Skin- no rash or lesion Psych- euthymic mood, full affect Neuro- strength and sensation are intact  EKG-afib at 98 bpm, RBBB, qrs int 140 ms, qtc 510 ms Epic records reviewed    Assessment and Plan: 1. afib I feel it is persistent and will set up for cardioversion, scheduled 3/19 No missed doses of eliquis Continue Bisoprolol 5 mg mg with better control of HR, seems to be tolerating from lung standpoint Update echo pending after  cardioversion TSH came back elevated at 6.106  on labs I ordered, referred to PCP and rest of thyroid profile returned normal so appears sub clinical  2. Chadsvasc score of at least 4 Continue eliquis 5 mg bid States no missed doses x 3 weeks  3. Shortness of breath Pt has gained an additional 2 lbs of fluid Cxr today Add lasix 20 mg bid x 3 days and then 20 mg a day until seen after cardioversion Istat am of cardioversion Add K+ 19meq daily Hold ramipril while on lasix to prevent hypotension  4. Asthma He states stable usually does not have to use inhaler  F/u here one week after [place]. Damiel Barthold, [facility] [address] [place], [place] [address] [phone]

## 2017-06-10 NOTE — ED Provider Notes (Addendum)
[facility] EMERGENCY DEPARTMENT Provider Note   CSN: [id number] Arrival date & time: 06/10/17  2043     History   Chief Complaint Chief Complaint  Patient presents with  . Atrial Fibrillation  . Shortness of Breath    HPI Donald [name] is a 84 y.o. male.  Patient with increasing shortness of breath intermittent chest pain and fluid in the legs and a significant weight gain over the past 2 weeks.  Patient seen by cardiology nurse practitioner today and also seen few days ago.  Recently started on Zebeta for atrial fib which is new and started on Eliquis and also started on Lasix for the fluid in the legs.  Chest x-ray was done today and reviewed no significant pulmonary edema on that.  Patient's past medical history is notable for ischemic cardiomyopathy a TIA in 2012 prostate cancer.  Patient had coronary artery bypass graft surgery in 98.       Past Medical History:  Diagnosis Date  . Asthma    with worsening with beta blockade  . CAD (coronary artery disease)   . CVA (cerebral infarction) 2012   tia, mild no residual defecits  . GI bleed 8 yrs ago   due to doll fully vessel which was clipped  . Glaucoma    excellent cataracts  . History of kidney stones   . Ischemic cardiomyopathy   . Nephrolithiasis   . Post-infarction apical thrombus ([place])   . Prostate cancer ([place]) 2012  . Radiation proctitis Feb 2015   treated with APC  . Tubular adenoma 04/2013   Dr. Hilarie Fredrickson    Patient Active Problem List   Diagnosis Date Noted  . OA (osteoarthritis) of knee 01/13/2017  . Encounter for therapeutic drug monitoring 05/10/2013  . Radiation proctitis 05/05/2013  . Colonic polyp 05/05/2013  . Chest pain 09/17/2011  . Atrial fibrillation ([place]) 05/19/2010  . MURAL THROMBUS, APEX OF HEART 09/18/2008  . HYPERCHOLESTEROLEMIA 09/14/2008  . HYPERLIPIDEMIA 09/14/2008  . CATARACTS 09/14/2008  . Coronary atherosclerosis 09/14/2008  . CARDIOMYOPATHY 09/14/2008  .  CEREBROVASCULAR ACCIDENT 09/14/2008  . TRANSIENT ISCHEMIC ATTACK 09/14/2008  . ASTHMA 09/14/2008  . GUAIAC POSITIVE STOOL 09/14/2008    Past Surgical History:  Procedure Laterality Date  . cardiac stents  2003  . COLONOSCOPY WITH PROPOFOL N/A 05/05/2013   Procedure: COLONOSCOPY WITH PROPOFOL;  Surgeon: Jerene Bears, MD;  [facility];  Service: Gastroenterology;  Laterality: N/A;  . CORONARY ARTERY BYPASS GRAFT  1998   LIMA to the LAD and saphenous vein graft tothe diagonal  . EXTRACORPOREAL SHOCK WAVE LITHOTRIPSY Right 04/29/2017   Procedure: RIGHT EXTRACORPOREAL SHOCK WAVE LITHOTRIPSY (ESWL);  Surgeon: Alexis Frock, MD;  Location[facility];  Service: Urology;  Laterality: Right;  . history of radiation treatment  2012   x 40 treatments done  . KNEE ARTHROSCOPY  2016 or 2017   Dr Gladstone Lighter ;   . PARTIAL KNEE ARTHROPLASTY Right 01/13/2017   Procedure: UNICOMPARTMENTAL RIGHT KNEE;  Surgeon: Gaynelle Arabian, MD;  Location[facility];  Service: Orthopedics;  Laterality: Right;  with block       Home Medications    Prior to Admission medications   Medication Sig Start Date End Date Taking? Authorizing Provider  albuterol (PROVENTIL HFA;VENTOLIN HFA) 108 (90 Base) MCG/ACT inhaler Inhale 1 puff into the lungs every 6 (six) hours as needed for wheezing or shortness of breath.   Yes [provider]  apixaban (ELIQUIS) 5 MG TABS tablet Take  5 mg by mouth 2 (two) times daily.   Yes [provider]  bisoprolol (ZEBETA) 5 MG tablet Take 1 tablet (5 mg total) by mouth daily. Patient taking differently: Take 5 mg by mouth at bedtime.  06/08/17 06/08/18 Yes Sherran Needs, NP  Cholecalciferol (VITAMIN D) 2000 UNITS tablet Take 2,000 Units by mouth daily.   Yes [provider]  fenofibrate micronized (LOFIBRA) 200 MG capsule Take 1 capsule (200 mg total) by mouth daily. 05/25/17  Yes Lelon Perla, MD  Fluticasone Furoate-Vilanterol (BREO ELLIPTA) 100-25 MCG/INH  AEPB Inhale 1 puff into the lungs daily as needed (shortness of breath).    Yes [provider]  latanoprost (XALATAN) 0.005 % ophthalmic solution Place 1 drop into the left eye at bedtime.    Yes [provider]  loratadine (CLARITIN) 10 MG tablet Take 10 mg by mouth daily.   Yes [provider]  Multiple Vitamin (MULTIVITAMIN WITH MINERALS) TABS tablet Take 1 tablet by mouth daily.   Yes [provider]  pravastatin (PRAVACHOL) 40 MG tablet Take 1 tablet (40 mg total) by mouth at bedtime. 01/16/14  Yes Lelon Perla, MD  Tamsulosin HCl (FLOMAX) 0.4 MG CAPS Take 0.4 mg by mouth every evening.    Yes [provider]  furosemide (LASIX) 20 MG tablet Take 20 mg by mouth twice daily for 3 days, then take 20 mg by mouth daily 06/10/17   Sherran Needs, NP  potassium chloride (K-DUR) 10 MEQ tablet Take 1 tablet (10 mEq total) by mouth daily. 06/10/17   Sherran Needs, NP  ramipril (ALTACE) 2.5 MG capsule Take 1 capsule (2.5 mg total) by mouth daily. 02/26/17   Lelon Perla, MD    Family History Family History  Problem Relation Age of Onset  . Heart attack Father   . Heart disease Mother   . Stomach cancer Mother     Social History Social History   Tobacco Use  . Smoking status: Never Smoker  . Smokeless tobacco: Never Used  Substance Use Topics  . Alcohol use: Yes    Alcohol/week: 0.0 oz    Comment: occasional beer or wine  . Drug use: No     Allergies   Keflex [cephalexin] and Morphine   Review of Systems Review of Systems  Constitutional: Positive for fatigue. Negative for fever.  HENT: Negative for congestion.   Eyes: Negative for redness.  Respiratory: Positive for shortness of breath.   Cardiovascular: Positive for palpitations and leg swelling.  Gastrointestinal: Negative for abdominal pain.  Genitourinary: Negative for dysuria.  Musculoskeletal: Negative for myalgias.  Skin: Negative for rash.  Neurological:  Negative for syncope and headaches.  Hematological: Bruises/bleeds easily.  Psychiatric/Behavioral: Negative for confusion.     Physical Exam Updated Vital Signs BP 97/73   Pulse 69   Temp 97.8 F (36.6 C) (Oral)   Resp (!) 21   Ht 1.753 m (5\' 9" )   Wt 93 kg (205 lb)   SpO2 99%   BMI 30.27 kg/m   Physical Exam  Constitutional: He is oriented to person, place, and time. He appears well-developed and well-nourished. No distress.  HENT:  Head: Normocephalic and atraumatic.  Mouth/Throat: Oropharynx is clear and moist.  Eyes: Conjunctivae and EOM are normal. Pupils are equal, round, and reactive to light.  Neck: Normal range of motion. Neck supple.  Cardiovascular: Normal rate.  Normal rate but irregular  Pulmonary/Chest: Effort normal and breath sounds normal. No respiratory distress.  He has no wheezes. He exhibits no tenderness.  Abdominal: Soft. Bowel sounds are normal. He exhibits distension. There is no tenderness.  Musculoskeletal: He exhibits edema.  Neurological: He is alert and oriented to person, place, and time. No cranial nerve deficit or sensory deficit. He exhibits normal muscle tone. Coordination normal.  Skin: Skin is warm.  Nursing note and vitals reviewed.    ED Treatments / Results  Labs (all labs ordered are listed, but only abnormal results are displayed) Labs Reviewed  BASIC METABOLIC PANEL - Abnormal; Notable for the following components:      Result Value   Glucose, Bld 137 (*)    BUN 32 (*)    Creatinine, Ser 1.29 (*)    GFR calc non Af Amer 48 (*)    GFR calc Af Amer 55 (*)    All other components within normal limits  CBC - Abnormal; Notable for the following components:   RBC 3.97 (*)    Hemoglobin 12.1 (*)    HCT 37.9 (*)    All other components within normal limits  LIPASE, BLOOD  BRAIN NATRIURETIC PEPTIDE  I-STAT TROPONIN, ED    EKG  EKG Interpretation None      ED ECG REPORT   Date: 06/10/2017  Rate: 90  Rhythm: atrial  fibrillation  QRS Axis: normal  Intervals: normal  ST/T Wave abnormalities: nonspecific ST/T changes  Conduction Disutrbances:right bundle branch block and left posterior fascicular block  Narrative Interpretation:   Old EKG Reviewed: none available  I have personally reviewed the EKG tracing and agree with the computerized printout as noted.   Radiology Dg Chest 2 View  Result Date: 06/10/2017 CLINICAL DATA:  Shortness of breath, checkup EXAM: CHEST - 2 VIEW COMPARISON:  Chest x-ray of 09/17/2011 FINDINGS: There are small pleural effusions present right slightly larger than left. There is also cardiomegaly present, findings which may indicate mild congestive heart failure. No edema is seen. Median sternotomy sutures are noted from prior CABG. There are degenerative changes in the mid to lower thoracic spine. IMPRESSION: 1. Small bilateral pleural pleural effusions with cardiomegaly. Question mild CHF. 2. Prior CABG. Electronically Signed   By: Ivar Drape M.D.   On: 06/10/2017 13:39    Procedures Procedures (including critical care time)  Medications Ordered in ED Medications - No data to display   Initial Impression / Assessment and Plan / ED Course  I have reviewed the triage vital signs and the nursing notes.  Pertinent labs & imaging results that were available during my care of the patient were reviewed by me and considered in my medical decision making (see chart for details).     Patient with a complaint of increasing shortness of breath and intermittent chest tightness over the past 2 weeks.  Patient been seen by cardiology recently including today.  Patient is also had increasing leg edema and weight gain.  Clinically there does appear to be concern for ascites in the abdomen.  Also new onset of atrial fibrillation.  At rest rate is controlled.  Patient is on Eliquis and patient also has been not been given Lasix by cardiology.  And patient also started on Zebeta for the  atrial fib.  Patient states she is taking 20 mg of Lasix twice a day.  Seen by cardiology last had labs on the 12th also had a chest x-ray earlier today that showed no definitive pulmonary edema.  On my evaluation felt that it was prudent to go ahead and  do CT chest with contrast to identify any subclinical pulmonary edema get a BUN check a troponin since he has had intermittent chest pain.  They have not been checked.  Get a CT scan of the abdomen I think is got ascites.  Patient's liver function testing in the past has not had significant abnormalities.  And his CBC is not had significant abnormalities.  Patient overall at rest his heart rate that is below 90 is atrial fib oxygen saturations are fine.  Patient does not use oxygen at home.  The intermittent fast heart rate possibly could be exertional due to the shortness of breath.  Patient is having trouble laying flat.     Final Clinical Impressions(s) / ED Diagnoses   Final diagnoses:  Shortness of breath  Paroxysmal atrial fibrillation ([place])  Bilateral leg edema    ED Discharge Orders    None       Fredia Sorrow, MD 06/10/17 2234  Addendum: Patient's labs show a significant change in his BUN and creatinine from just on March 12.  Factor could be the Lasix that he has been taking.  This alone will probably lead to admission.  Troponin was negative which is reassuring.  As stated CT chest and CT abdomen is pending to mostly rule out ascites and also to make sure there is no evidence of pulmonary edema.  Regular chest x-ray earlier today showed no real signs of edema.  Additionally due to the acute renal failure I thought maybe it was due to the Lasix but family member states that he has not taken the Lasix at all yet.      Fredia Sorrow, MD 06/10/17 7903    Fredia Sorrow, MD 06/10/17 2253

## 2017-06-10 NOTE — ED Notes (Signed)
Patient transported to X-ray 

## 2017-06-10 NOTE — Patient Instructions (Addendum)
Start Furosemide 20 mg twice a day for 3 days, then decrease to one tablet once a day thereafter.    Start potassium 10 mEq once daily.  These have been sent to your pharmacy  HOLD the ramipril will taking the fluid pills.  Weigh daily first thing in the morning.  Your cardioversion is scheduled for : Tuesday 06/15/17 Arrive at the [facility] and go to admitting at 12:30 Do Not eat or drink anything after midnight the night prior to your procedure. Take all your medications with a sip of water prior to arrival. Do NOT miss any doses of your blood thinner. You will NOT be able to drive home after your procedure.   You will have a chest xray today.  We will follow up with you on these results.

## 2017-06-10 NOTE — ED Triage Notes (Signed)
Pt has been having sob and chest tightness for the past two weeks, worsening since Sunday. Pt has seen on Tuesday by cardiologist and was started on a betablocker (ekg showed afib rate of 130). Has had a 5lb weight gain since Tuesday, with a 25lb weight gain since January. Denies hx of CHF. Pt was prescribed lasix today. Pt short of breath when talking. Appears slightly labored when sitting.

## 2017-06-11 ENCOUNTER — Other Ambulatory Visit: Payer: Self-pay

## 2017-06-11 DIAGNOSIS — Z923 Personal history of irradiation: Secondary | ICD-10-CM | POA: Diagnosis not present

## 2017-06-11 DIAGNOSIS — D649 Anemia, unspecified: Secondary | ICD-10-CM | POA: Diagnosis not present

## 2017-06-11 DIAGNOSIS — I480 Paroxysmal atrial fibrillation: Secondary | ICD-10-CM | POA: Diagnosis not present

## 2017-06-11 DIAGNOSIS — I4510 Unspecified right bundle-branch block: Secondary | ICD-10-CM | POA: Diagnosis present

## 2017-06-11 DIAGNOSIS — N179 Acute kidney failure, unspecified: Secondary | ICD-10-CM

## 2017-06-11 DIAGNOSIS — Z8546 Personal history of malignant neoplasm of prostate: Secondary | ICD-10-CM | POA: Diagnosis not present

## 2017-06-11 DIAGNOSIS — I255 Ischemic cardiomyopathy: Secondary | ICD-10-CM | POA: Diagnosis not present

## 2017-06-11 DIAGNOSIS — I5041 Acute combined systolic (congestive) and diastolic (congestive) heart failure: Secondary | ICD-10-CM | POA: Diagnosis not present

## 2017-06-11 DIAGNOSIS — H409 Unspecified glaucoma: Secondary | ICD-10-CM | POA: Diagnosis not present

## 2017-06-11 DIAGNOSIS — Z951 Presence of aortocoronary bypass graft: Secondary | ICD-10-CM | POA: Diagnosis not present

## 2017-06-11 DIAGNOSIS — Z800 Family history of malignant neoplasm of digestive organs: Secondary | ICD-10-CM | POA: Diagnosis not present

## 2017-06-11 DIAGNOSIS — E785 Hyperlipidemia, unspecified: Secondary | ICD-10-CM | POA: Diagnosis present

## 2017-06-11 DIAGNOSIS — I481 Persistent atrial fibrillation: Secondary | ICD-10-CM | POA: Diagnosis not present

## 2017-06-11 DIAGNOSIS — Z7901 Long term (current) use of anticoagulants: Secondary | ICD-10-CM | POA: Diagnosis not present

## 2017-06-11 DIAGNOSIS — R0602 Shortness of breath: Secondary | ICD-10-CM | POA: Diagnosis not present

## 2017-06-11 DIAGNOSIS — I4891 Unspecified atrial fibrillation: Secondary | ICD-10-CM | POA: Diagnosis not present

## 2017-06-11 DIAGNOSIS — Z885 Allergy status to narcotic agent status: Secondary | ICD-10-CM | POA: Diagnosis not present

## 2017-06-11 DIAGNOSIS — E7800 Pure hypercholesterolemia, unspecified: Secondary | ICD-10-CM | POA: Diagnosis not present

## 2017-06-11 DIAGNOSIS — I2510 Atherosclerotic heart disease of native coronary artery without angina pectoris: Secondary | ICD-10-CM | POA: Diagnosis not present

## 2017-06-11 DIAGNOSIS — Z881 Allergy status to other antibiotic agents status: Secondary | ICD-10-CM | POA: Diagnosis not present

## 2017-06-11 DIAGNOSIS — I509 Heart failure, unspecified: Secondary | ICD-10-CM | POA: Diagnosis not present

## 2017-06-11 DIAGNOSIS — Z8249 Family history of ischemic heart disease and other diseases of the circulatory system: Secondary | ICD-10-CM | POA: Diagnosis not present

## 2017-06-11 DIAGNOSIS — I498 Other specified cardiac arrhythmias: Secondary | ICD-10-CM | POA: Diagnosis not present

## 2017-06-11 DIAGNOSIS — R188 Other ascites: Secondary | ICD-10-CM | POA: Diagnosis not present

## 2017-06-11 DIAGNOSIS — I340 Nonrheumatic mitral (valve) insufficiency: Secondary | ICD-10-CM | POA: Diagnosis not present

## 2017-06-11 DIAGNOSIS — Z87442 Personal history of urinary calculi: Secondary | ICD-10-CM | POA: Diagnosis not present

## 2017-06-11 DIAGNOSIS — Z8673 Personal history of transient ischemic attack (TIA), and cerebral infarction without residual deficits: Secondary | ICD-10-CM | POA: Diagnosis not present

## 2017-06-11 DIAGNOSIS — J449 Chronic obstructive pulmonary disease, unspecified: Secondary | ICD-10-CM | POA: Diagnosis present

## 2017-06-11 DIAGNOSIS — H269 Unspecified cataract: Secondary | ICD-10-CM | POA: Diagnosis present

## 2017-06-11 DIAGNOSIS — I5021 Acute systolic (congestive) heart failure: Secondary | ICD-10-CM | POA: Diagnosis not present

## 2017-06-11 DIAGNOSIS — Z955 Presence of coronary angioplasty implant and graft: Secondary | ICD-10-CM | POA: Diagnosis not present

## 2017-06-11 LAB — CBC WITH DIFFERENTIAL/PLATELET
BASOS PCT: 0 %
Band Neutrophils: 1 %
Basophils Absolute: 0 10*3/uL (ref 0.0–0.1)
Blasts: 0 %
EOS ABS: 0.2 10*3/uL (ref 0.0–0.7)
Eosinophils Relative: 3 %
HCT: 36.5 % — ABNORMAL LOW (ref 39.0–52.0)
HEMOGLOBIN: 11.6 g/dL — AB (ref 13.0–17.0)
Lymphocytes Relative: 36 %
Lymphs Abs: 1.8 10*3/uL (ref 0.7–4.0)
MCH: 30.6 pg (ref 26.0–34.0)
MCHC: 31.8 g/dL (ref 30.0–36.0)
MCV: 96.3 fL (ref 78.0–100.0)
MONO ABS: 0.3 10*3/uL (ref 0.1–1.0)
MYELOCYTES: 0 %
Metamyelocytes Relative: 0 %
Monocytes Relative: 5 %
NEUTROS PCT: 55 %
NRBC: 0 /100{WBCs}
Neutro Abs: 2.8 10*3/uL (ref 1.7–7.7)
Other: 0 %
Platelets: 196 10*3/uL (ref 150–400)
Promyelocytes Absolute: 0 %
RBC: 3.79 MIL/uL — ABNORMAL LOW (ref 4.22–5.81)
RDW: 14.3 % (ref 11.5–15.5)
WBC: 5.1 10*3/uL (ref 4.0–10.5)

## 2017-06-11 LAB — BASIC METABOLIC PANEL
ANION GAP: 11 (ref 5–15)
BUN: 30 mg/dL — ABNORMAL HIGH (ref 6–20)
CALCIUM: 9.4 mg/dL (ref 8.9–10.3)
CO2: 22 mmol/L (ref 22–32)
Chloride: 106 mmol/L (ref 101–111)
Creatinine, Ser: 1.22 mg/dL (ref 0.61–1.24)
GFR, EST AFRICAN AMERICAN: 59 mL/min — AB (ref >60)
GFR, EST NON AFRICAN AMERICAN: 51 mL/min — AB (ref >60)
Glucose, Bld: 95 mg/dL (ref 65–99)
Potassium: 4.1 mmol/L (ref 3.5–5.1)
SODIUM: 139 mmol/L (ref 135–145)

## 2017-06-11 LAB — TROPONIN I
Troponin I: <0.03 ng/mL (ref <0.03)
Troponin I: 0.03 ng/mL (ref <0.03)
Troponin I: 0.03 ng/mL (ref <0.03)

## 2017-06-11 LAB — TSH: TSH: 9.474 u[IU]/mL — AB (ref 0.350–4.500)

## 2017-06-11 LAB — MAGNESIUM: MAGNESIUM: 1.7 mg/dL (ref 1.7–2.4)

## 2017-06-11 MED ORDER — FLUTICASONE FUROATE-VILANTEROL 100-25 MCG/INH IN AEPB: 1.0000 | INHALATION_SPRAY | Freq: Every day | RESPIRATORY_TRACT | Status: DC | PRN

## 2017-06-11 MED ORDER — ONDANSETRON HCL 4 MG/2ML IJ SOLN: 4.0000 mg | Freq: Four times a day (QID) | INTRAMUSCULAR | Status: DC | PRN

## 2017-06-11 MED ORDER — BISOPROLOL FUMARATE 5 MG PO TABS
5.0000 mg | ORAL_TABLET | Freq: Every day | ORAL | Status: DC
  Administered 2017-06-11 – 2017-06-15 (×6): 5 mg via ORAL
  Filled 2017-06-11 (×7): qty 1

## 2017-06-11 MED ORDER — SODIUM CHLORIDE 0.9% FLUSH: 3.0000 mL | INTRAVENOUS | Status: DC | PRN

## 2017-06-11 MED ORDER — LATANOPROST 0.005 % OP SOLN
1.0000 [drp] | Freq: Every day | OPHTHALMIC | Status: DC
  Administered 2017-06-11 – 2017-06-15 (×6): 1 [drp] via OPHTHALMIC
  Filled 2017-06-11: qty 2.5

## 2017-06-11 MED ORDER — FUROSEMIDE 10 MG/ML IJ SOLN
20.0000 mg | Freq: Two times a day (BID) | INTRAMUSCULAR | Status: DC
  Administered 2017-06-11 – 2017-06-13 (×5): 20 mg via INTRAVENOUS
  Filled 2017-06-11 (×3): qty 2
  Filled 2017-06-11: qty 4
  Filled 2017-06-11: qty 2

## 2017-06-11 MED ORDER — FENOFIBRATE 160 MG PO TABS
160.0000 mg | ORAL_TABLET | Freq: Every day | ORAL | Status: DC
  Administered 2017-06-12 – 2017-06-16 (×5): 160 mg via ORAL
  Filled 2017-06-11 (×7): qty 1

## 2017-06-11 MED ORDER — FUROSEMIDE 10 MG/ML IJ SOLN
10.0000 mg | INTRAMUSCULAR | Status: AC
  Administered 2017-06-11: 10 mg via INTRAVENOUS
  Filled 2017-06-11: qty 2

## 2017-06-11 MED ORDER — LORATADINE 10 MG PO TABS
10.0000 mg | ORAL_TABLET | Freq: Every day | ORAL | Status: DC
  Administered 2017-06-11 – 2017-06-16 (×6): 10 mg via ORAL
  Filled 2017-06-11 (×6): qty 1

## 2017-06-11 MED ORDER — SODIUM CHLORIDE 0.9 % IV SOLN
250.0000 mL | INTRAVENOUS | Status: DC | PRN
  Administered 2017-06-15: 13:00:00 via INTRAVENOUS

## 2017-06-11 MED ORDER — APIXABAN 5 MG PO TABS
5.0000 mg | ORAL_TABLET | Freq: Two times a day (BID) | ORAL | Status: DC
  Administered 2017-06-11 – 2017-06-16 (×12): 5 mg via ORAL
  Filled 2017-06-11 (×13): qty 1

## 2017-06-11 MED ORDER — SODIUM CHLORIDE 0.9% FLUSH
3.0000 mL | Freq: Two times a day (BID) | INTRAVENOUS | Status: DC
  Administered 2017-06-11 – 2017-06-16 (×10): 3 mL via INTRAVENOUS

## 2017-06-11 MED ORDER — PRAVASTATIN SODIUM 40 MG PO TABS
40.0000 mg | ORAL_TABLET | Freq: Every day | ORAL | Status: DC
  Administered 2017-06-11 – 2017-06-15 (×6): 40 mg via ORAL
  Filled 2017-06-11 (×6): qty 1

## 2017-06-11 MED ORDER — TAMSULOSIN HCL 0.4 MG PO CAPS
0.4000 mg | ORAL_CAPSULE | Freq: Every evening | ORAL | Status: DC
  Administered 2017-06-11 – 2017-06-15 (×6): 0.4 mg via ORAL
  Filled 2017-06-11 (×6): qty 1

## 2017-06-11 MED ORDER — IPRATROPIUM-ALBUTEROL 0.5-2.5 (3) MG/3ML IN SOLN: 3.0000 mL | RESPIRATORY_TRACT | Status: DC | PRN

## 2017-06-11 MED ORDER — POTASSIUM CHLORIDE CRYS ER 10 MEQ PO TBCR
10.0000 meq | EXTENDED_RELEASE_TABLET | Freq: Every day | ORAL | Status: DC
  Administered 2017-06-11 – 2017-06-16 (×6): 10 meq via ORAL
  Filled 2017-06-11 (×6): qty 1

## 2017-06-11 MED ORDER — ACETAMINOPHEN 325 MG PO TABS
650.0000 mg | ORAL_TABLET | ORAL | Status: DC | PRN
  Filled 2017-06-11: qty 2

## 2017-06-11 NOTE — Progress Notes (Signed)
Please refer to H and P for details regarding assessment and plan.  Cardiology consulted.  Gen: pt in nad, alert and awake Cv: s1 and s2 wnl, no rubs Pulm: no increased wob, no wheezes  Velvet Bathe   Will reassess next am.

## 2017-06-11 NOTE — ED Provider Notes (Signed)
12:08 AM Handoff from Dr. Rogene Houston at shift change.   Patient with symptoms concerning for new onset heart failure in setting of rate controlled atrial fibrillation.  Pending chest and abdominal CT.  These showed small bilateral pleural effusions as well as small amount of ascites.  BNP elevated.  20 mg IV Lasix ordered.  Spoke with Dr. Tamala Julian of [facility] hospitalist who will see patient.  Patient and family updated.  BP 99/74   Pulse 78   Temp 97.8 F (36.6 C) (Oral)   Resp (!) 21   Ht 5\' 9"  (1.753 m)   Wt 93 kg (205 lb)   SpO2 97%   BMI 30.27 kg/m     Carlisle Cater, PA-C 06/11/17 0009    Fredia Sorrow, MD 06/11/17 [phone]

## 2017-06-11 NOTE — Care Management Note (Signed)
Case Management Note  Patient Details  Name: CHE RACHAL MRN: [id number] Date of Birth: 02-21-1927  Subjective/Objective:                  84 y.o. male with a h/o asthma, CAD,CVA, that is in the afib clinic for evulation of shortness of breath. From home alone.  Action/Plan: Admit status INPATIENT (CHF); anticipate discharge [place].   Expected Discharge Date:  (unknown)               Expected Discharge Plan:  [place]  In-House Referral:     Discharge planning Services  CM Consult  Post Acute Care Choice:    Choice offered to:     DME Arranged:    DME Agency:     HH Arranged:    HH Agency:     Status of Service:  In process, will continue to follow  If discussed at Long Length of Stay Meetings, dates discussed:    Additional Comments: PCP: [name] SUPPORT: ANGELA Neer ([place])  Fuller Mandril, RN 06/11/2017, 10:58 AM

## 2017-06-11 NOTE — ED Notes (Signed)
Placed meal order for patient.

## 2017-06-11 NOTE — ED Notes (Signed)
Patient sitting up in bed, eating lunch. Denies pain or chest tightness at this time. Resp remain e/u at rest. Family at bedside.

## 2017-06-11 NOTE — ED Notes (Signed)
Patient placed on 2L humified O2 Sophia per MD order.

## 2017-06-11 NOTE — ED Notes (Signed)
Dr. Smith at bedside.

## 2017-06-11 NOTE — ED Notes (Signed)
Cardiology at bedside.

## 2017-06-11 NOTE — Consult Note (Addendum)
Cardiology Consultation:   Patient ID: Donald [name]; [id number]; 12/18/1926   Admit date: 06/10/2017 Date of Consult: 06/11/2017  Primary Care Provider: Haywood Pao, MD Primary Cardiologist: Dr. Kirk Ruths Primary Electrophysiologist:  Roderic Palau, NP  AHMIR BRACKEN is a 84 y.o. male who is being seen today for the evaluation of CHF at the request of Dr. Wendee Beavers.  Patient Profile:   Donald [name] is a 84 y.o. male with a hx of CAD, s/p CABG 1998 w/ non-isch MV 11/2016, h/o ICM with improved EF, CVA in setting of mural apical thrombus, persistent atrial fibrillation (on Eliquis), prostate cancer, and hx of GI bleed who is being seen today for the evaluation of symptoms concerning for new onset HF in the setting of rate controlled atrial fibrillation.   History of Present Illness:   Donald [name] reports progressively worsening SOB x 2 weeks with an elevated HR at home in the 130s. Patient reports significantly worsening dyspnea on exertion four days prior to arrival in the ED. He was evaluated by cardiology two days ago in regards to persistent Afib and was started on bisoprolol, with a planned cardioversion scheduled on 06/15/17. Due to worsening SOB, patient was seen again by the Afib clinic yesterday and started on PO Lasix and KCl.   Today the SOB was worse and he came to the ER. In the ER, he has gotten IV Lasix with good UOP but is still SOB w/ minimal activity.  In the ED, EKG showed Afib with HR elevated at 136 and SBP readings 90s-100s. CXR demonstrated BL pleural effusions. BNP 930. Troponin negative. BUN 32, Cr 1.29. Started om 20 mg IV Lasix.    Patient also endorses fatigue, LLE, abdominal distension (25 lb weight gain since January), chest tightness, and wheezing. Describes orthopnea, denies PND, cough, fever/chills, N/V/D.    For his age, he is highly functioning, not aware of the rapid HR and only aware of the shortness of breath which he has noticed worsening of  this over the last month. He is currently taking Eliquis 5 mg BID/chadsvasc score of at least 4. Denies excessive caffeine, and no alcohol or tobacco use.   Pt states clearly that he has not missed any doses of Eliquis.  Pt states that he gets chest pressure when he gets SOB, reminds him of pre-CABG sx, but sx resolve once the SOB gets better, with rest.   Past Medical History:  Diagnosis Date  . Asthma    with worsening with beta blockade  . CAD (coronary artery disease)   . CVA (cerebral infarction) 2012   tia, mild no residual defecits  . GI bleed 8 yrs ago   due to doll fully vessel which was clipped  . Glaucoma    excellent cataracts  . History of kidney stones   . Ischemic cardiomyopathy   . Nephrolithiasis   . Post-infarction apical thrombus ([place])   . Prostate cancer ([place]) 2012  . Radiation proctitis Feb 2015   treated with APC  . Tubular adenoma 04/2013   Dr. Hilarie Fredrickson    Past Surgical History:  Procedure Laterality Date  . cardiac stents  2003  . COLONOSCOPY WITH PROPOFOL N/A 05/05/2013   Procedure: COLONOSCOPY WITH PROPOFOL;  Surgeon: Jerene Bears, MD;  Location: [facility];  Service: Gastroenterology;  Laterality: N/A;  . CORONARY ARTERY BYPASS GRAFT  1998   LIMA to the LAD and saphenous vein graft tothe diagonal  . EXTRACORPOREAL SHOCK WAVE LITHOTRIPSY Right 04/29/2017  Procedure: RIGHT EXTRACORPOREAL SHOCK WAVE LITHOTRIPSY (ESWL);  Surgeon: Alexis Frock, MD;  Location: [facility];  Service: Urology;  Laterality: Right;  . history of radiation treatment  2012   x 40 treatments done  . KNEE ARTHROSCOPY  2016 or 2017   Dr Gladstone Lighter ;   . PARTIAL KNEE ARTHROPLASTY Right 01/13/2017   Procedure: UNICOMPARTMENTAL RIGHT KNEE;  Surgeon: Gaynelle Arabian, MD;  Location: [facility];  Service: Orthopedics;  Laterality: Right;  with block     Prior to Admission medications   Medication Sig Start Date End Date Taking? Authorizing Provider  albuterol (PROVENTIL HFA;VENTOLIN HFA) 108  (90 Base) MCG/ACT inhaler Inhale 1 puff into the lungs every 6 (six) hours as needed for wheezing or shortness of breath.   Yes [provider]  apixaban (ELIQUIS) 5 MG TABS tablet Take 5 mg by mouth 2 (two) times daily.   Yes [provider]  bisoprolol (ZEBETA) 5 MG tablet Take 1 tablet (5 mg total) by mouth daily. Patient taking differently: Take 5 mg by mouth at bedtime.  06/08/17 06/08/18 Yes Sherran Needs, NP  Cholecalciferol (VITAMIN D) 2000 UNITS tablet Take 2,000 Units by mouth daily.   Yes [provider]  fenofibrate micronized (LOFIBRA) 200 MG capsule Take 1 capsule (200 mg total) by mouth daily. 05/25/17  Yes Lelon Perla, MD  Fluticasone Furoate-Vilanterol (BREO ELLIPTA) 100-25 MCG/INH AEPB Inhale 1 puff into the lungs daily as needed (shortness of breath).    Yes [provider]  latanoprost (XALATAN) 0.005 % ophthalmic solution Place 1 drop into the left eye at bedtime.    Yes [provider]  loratadine (CLARITIN) 10 MG tablet Take 10 mg by mouth daily.   Yes [provider]  Multiple Vitamin (MULTIVITAMIN WITH MINERALS) TABS tablet Take 1 tablet by mouth daily.   Yes [provider]  pravastatin (PRAVACHOL) 40 MG tablet Take 1 tablet (40 mg total) by mouth at bedtime. 01/16/14  Yes Lelon Perla, MD  Tamsulosin HCl (FLOMAX) 0.4 MG CAPS Take 0.4 mg by mouth every evening.    Yes [provider]  furosemide (LASIX) 20 MG tablet Take 20 mg by mouth twice daily for 3 days, then take 20 mg by mouth daily 06/10/17   Sherran Needs, NP  potassium chloride (K-DUR) 10 MEQ tablet Take 1 tablet (10 mEq total) by mouth daily. 06/10/17   Sherran Needs, NP  ramipril (ALTACE) 2.5 MG capsule Take 1 capsule (2.5 mg total) by mouth daily. 02/26/17   Lelon Perla, MD    Inpatient Medications: Scheduled Meds: . apixaban  5 mg Oral BID  . bisoprolol  5 mg Oral QHS  . fenofibrate  160 mg Oral Daily  .  furosemide  20 mg Intravenous BID  . latanoprost  1 drop Left Eye QHS  . loratadine  10 mg Oral Daily  . potassium chloride  10 mEq Oral Daily  . pravastatin  40 mg Oral QHS  . sodium chloride flush  3 mL Intravenous Q12H  . tamsulosin  0.4 mg Oral QPM   Continuous Infusions: . sodium chloride     PRN Meds: sodium chloride, acetaminophen, fluticasone furoate-vilanterol, ipratropium-albuterol, ondansetron (ZOFRAN) IV, sodium chloride flush  Allergies:    Allergies  Allergen Reactions  . Keflex [Cephalexin] Nausea And Vomiting and Other (See Comments)    'Killed all GI enzymes" (also) and patient prefers to not take this  . Morphine Nausea Only    Social History:  Social History   Socioeconomic History  . Marital status: Widowed    Spouse name: Not on file  . Number of children: 3  . Years of education: Not on file  . Highest education level: Not on file  Social Needs  . Financial resource strain: Not on file  . Food insecurity - worry: Not on file  . Food insecurity - inability: Not on file  . Transportation needs - medical: Not on file  . Transportation needs - non-medical: Not on file  Occupational History  . Occupation: Retired Conservation officer, [employer]  Tobacco Use  . Smoking status: Never Smoker  . Smokeless tobacco: Never Used  Substance and Sexual Activity  . Alcohol use: Yes    Alcohol/week: 0.0 oz    Comment: occasional beer or wine  . Drug use: No  . Sexual activity: Not on file  Other Topics Concern  . Not on file  Social History Narrative  . Not on file    Family History:   Family History  Problem Relation Age of Onset  . Heart attack Father   . Heart disease Mother   . Stomach cancer Mother    Family Status:  Family Status  Relation Name Status  . Father  Deceased  . Mother  Deceased  . MGM  Deceased  . MGF  Deceased  . PGM  Deceased  . PGF  Deceased    ROS:  Please see the history of present illness.  All other ROS reviewed and negative.      Physical Exam/Data:   Vitals:   06/11/17 0734 06/11/17 0800 06/11/17 0900 06/11/17 1006  BP: 99/75 106/79 93/69 107/65  Pulse:  81 71 (!) 46  Resp: (!) 25 19 18  (!) 21  Temp:      TempSrc:      SpO2: 98% 96% 98% 98%  Weight:      Height:        Intake/Output Summary (Last 24 hours) at 06/11/2017 1049 Last data filed at 06/11/2017 1024 Gross per 24 hour  Intake -  Output 1040 ml  Net -1040 ml   Filed Weights   06/10/17 2059  Weight: 205 lb (93 kg)   Body mass index is 30.27 kg/m.  General:  Well nourished, well developed, in no acute distress HEENT: mucous membranes dry Lymph: no adenopathy Neck: JVD to jaw Endocrine:  No thryomegaly Vascular: No carotid bruits; 4/4 extremity pulses 2+, without bruits  Cardiac:  normal S1, S2; RRR; no murmur  Lungs:  Crackles in mid-lower lungs BL, with diminished breath sounds in bases BL Abd: soft, nontender, no hepatomegaly  Ext: no edema Musculoskeletal:  No deformities, BUE and BLE strength normal and equal Skin: warm and dry  Neuro:  CNs 2-12 intact, no focal abnormalities noted Psych:  Normal affect   EKG:  The EKG was personally reviewed and demonstrates:  HR 90, atrial fibrillation with RBBB, QTc 400, T wave inversions observed in V1-V4  Telemetry:  Telemetry was personally reviewed and demonstrates:  Atrial fib, HR elevated with minimal activity  Relevant CV Studies:  ECHO: 11/13/2013, echo ordered 06/11/17 Study Conclusions - Left ventricle: There appears to be a persistent smal layered mural thrombus by definity contrast study. distal septal akinesis. The cavity size was normal. Systolic function was normal. The estimated ejection fraction was in the range of 50% to 55%. There is akinesis of the entireapical myocardium. There was an increased relative contribution of atrial contraction to ventricular filling. Doppler parameters are  consistent with abnormal left ventricular relaxation (grade 1  diastolic dysfunction). - Aortic valve: Moderate diffuse calcification, consistent with sclerosis. There was trivial regurgitation. - Mitral valve: Calcified annulus. Mildly thickened leaflets . Mild calcification. There was trivial regurgitation. - Left atrium: The atrium was mildly dilated. - Right ventricle: The cavity size was moderately dilated. Wall thickness was normal. - Pulmonary arteries: PA peak pressure: 56 mm Hg (S). Impressions: - The right ventricular systolic pressure was increased consistent with moderate pulmonary hypertension.  CATH:  09/24/2011 Coronary Arteries: Right dominant with no anomalies LM: 30% LAD: 100% ostial occlusion  Distal LAD fills via RV branch off of RCA             IM:  95% diffuse subtotal disease with remnant of occluded SVG             Circumflex: 40% mid vessel stenosis             OM1: Large branching artery that is normal             AV groove: normal RCA: normal with large RV branch supplying distal LAD             PDA: Small vessel no focal stenosis             PLA: possibly occluded the most distal RCA appears to fill from LIMA collaterals LIMA:  Patent but does not supply distal LAD  Connects via small vessel to ? Distal RCA/PLA SVG: IM is occluded Aortography:  LAO no patent SVG;s seen mild aortic root dilatation Hemodynamics:             Aortic Pressure: 83 /48 63  mmHg             LV Pressure: AV not crossed due to LV apical thrombus Impression:  Native RCA and circumflex with no high grade stenosis.  LIMA does not supply distal LAD but Large RV branch supplies collaterals to LAD Coronary circulation is stable.  [facility]  Resume coumadin tonight.  BP is low and may need meds/ACE adjusted downwards.    Laboratory Data:  Chemistry Recent Labs  Lab 06/08/17 1506 06/10/17 2114  NA 142 139  K 4.1 4.4  CL 110 107  CO2 25 22  GLUCOSE 91 137*  BUN 17 32*  CREATININE 0.84 1.29*  CALCIUM 9.1 9.5  GFRNONAA  >60 48*  GFRAA >60 55*  ANIONGAP 7 10    Lab Results  Component Value Date   ALT 16 (L) 06/08/2017   AST 24 06/08/2017   ALKPHOS 34 (L) 06/08/2017   BILITOT 0.7 06/08/2017   Hematology Recent Labs  Lab 06/08/17 1506 06/10/17 2114 06/11/17 0606  WBC 4.5 5.9 5.1  RBC 4.04* 3.97* 3.79*  HGB 12.6* 12.1* 11.6*  HCT 39.5 37.9* 36.5*  MCV 97.8 95.5 96.3  MCH 31.2 30.5 30.6  MCHC 31.9 31.9 31.8  RDW 14.4 14.2 14.3  PLT 214 210 196   Cardiac Enzymes Recent Labs  Lab 06/11/17 0207 06/11/17 0606  TROPONINI <0.03 0.03*    Recent Labs  Lab 06/10/17 2128  TROPIPOC 0.01    BNP Recent Labs  Lab 06/10/17 2217  BNP 930.7*    TSH:  Lab Results  Component Value Date   TSH 9.474 (H) 06/10/2017   Magnesium:  Magnesium  Date Value Ref Range Status  06/11/2017 1.7 1.7 - 2.4 mg/dL Final    Comment:    Performed at [facility], [place]  [address], [place], [place] [address]     Radiology/Studies:  Dg Chest 2 View  Result Date: 06/10/2017 CLINICAL DATA:  Shortness of breath, checkup EXAM: CHEST - 2 VIEW COMPARISON:  Chest x-ray of 09/17/2011 FINDINGS: There are small pleural effusions present right slightly larger than left. There is also cardiomegaly present, findings which may indicate mild congestive heart failure. No edema is seen. Median sternotomy sutures are noted from prior CABG. There are degenerative changes in the mid to lower thoracic spine. IMPRESSION: 1. Small bilateral pleural pleural effusions with cardiomegaly. Question mild CHF. 2. Prior CABG. Electronically Signed   By: Ivar Drape M.D.   On: 06/10/2017 13:39   Ct Chest W Contrast  Result Date: 06/10/2017 CLINICAL DATA:  Subacute onset of shortness of breath and generalized chest tightness. Atrial fibrillation. Recent weight gain. EXAM: CT CHEST, ABDOMEN, AND PELVIS WITH CONTRAST TECHNIQUE: Multidetector CT imaging of the chest, abdomen and pelvis was performed following the standard protocol during bolus  administration of intravenous contrast. CONTRAST:  133mL ISOVUE-300 IOPAMIDOL (ISOVUE-300) INJECTION 61% COMPARISON:  CT of the abdomen and pelvis performed 03/12/2017 FINDINGS: CT CHEST FINDINGS Cardiovascular: The heart is normal in size. Diffuse coronary artery calcifications are seen. The patient is status post median sternotomy. Mild calcification is noted along the aortic arch and proximal great vessels. Mediastinum/Nodes: Scattered calcified mediastinal and hilar nodes are noted. No pericardial effusion is identified. No mediastinal lymphadenopathy is seen. The thyroid gland is unremarkable. No axillary lymphadenopathy is appreciated. Lungs/Pleura: Small bilateral pleural effusions are noted. Minimal right-sided airspace opacity may reflect mild interstitial edema. No pneumothorax is seen. No masses are identified. Musculoskeletal: No acute osseous abnormalities are identified. The visualized musculature is unremarkable in appearance. CT ABDOMEN PELVIS FINDINGS Hepatobiliary: The liver is unremarkable in appearance. Mild soft tissue inflammation about the gallbladder is nonspecific in the presence of ascites. The common bile duct remains normal in caliber. Trace ascites is noted about the liver. Pancreas: The pancreas is within normal limits. Spleen: Scattered calcified granulomata are noted within the spleen. The spleen is otherwise unremarkable. Adrenals/Urinary Tract: The adrenal glands are unremarkable in appearance. Nonspecific perinephric stranding is noted bilaterally. There is no evidence of hydronephrosis. Mild scarring is noted at the lower pole of the kidneys. No renal or ureteral stones are identified. Stomach/Bowel: The stomach is unremarkable in appearance. The small bowel is within normal limits. The appendix is normal in caliber, without evidence of appendicitis. The colon is unremarkable in appearance. Vascular/Lymphatic: Scattered calcification is seen along the abdominal aorta and its  branches. The abdominal aorta is otherwise grossly unremarkable. The inferior vena cava is grossly unremarkable. No retroperitoneal lymphadenopathy is seen. No pelvic sidewall lymphadenopathy is identified. Reproductive: Mild soft tissue inflammation about the bladder may reflect cystitis. The prostate remains normal in size, with scattered brachytherapy seeds. Other: Mild soft tissue inflammation is noted along the patient's pannus. Musculoskeletal: No acute osseous abnormalities are identified. Facet disease is noted along the lumbar spine, with underlying vacuum phenomenon. The visualized musculature is unremarkable in appearance. IMPRESSION: 1. Small bilateral pleural effusions. Minimal right-sided airspace opacity may reflect mild interstitial edema. 2. Mild soft tissue inflammation about the bladder may reflect cystitis. 3. Diffuse coronary artery calcifications. 4. Changes of prior granulomatous disease, with calcified mediastinal and hilar nodes. 5. Mild soft tissue inflammation about the gallbladder is nonspecific in the presence of ascites. Trace ascites noted about the liver. 6. Mild soft tissue inflammation along the patient's pannus. Electronically Signed   By: Jacqulynn Cadet  Chang M.D.   On: 06/10/2017 23:28   Ct Abdomen Pelvis W Contrast  Result Date: 06/10/2017 CLINICAL DATA:  Subacute onset of shortness of breath and generalized chest tightness. Atrial fibrillation. Recent weight gain. EXAM: CT CHEST, ABDOMEN, AND PELVIS WITH CONTRAST TECHNIQUE: Multidetector CT imaging of the chest, abdomen and pelvis was performed following the standard protocol during bolus administration of intravenous contrast. CONTRAST:  159mL ISOVUE-300 IOPAMIDOL (ISOVUE-300) INJECTION 61% COMPARISON:  CT of the abdomen and pelvis performed 03/12/2017 FINDINGS: CT CHEST FINDINGS Cardiovascular: The heart is normal in size. Diffuse coronary artery calcifications are seen. The patient is status post median sternotomy. Mild  calcification is noted along the aortic arch and proximal great vessels. Mediastinum/Nodes: Scattered calcified mediastinal and hilar nodes are noted. No pericardial effusion is identified. No mediastinal lymphadenopathy is seen. The thyroid gland is unremarkable. No axillary lymphadenopathy is appreciated. Lungs/Pleura: Small bilateral pleural effusions are noted. Minimal right-sided airspace opacity may reflect mild interstitial edema. No pneumothorax is seen. No masses are identified. Musculoskeletal: No acute osseous abnormalities are identified. The visualized musculature is unremarkable in appearance. CT ABDOMEN PELVIS FINDINGS Hepatobiliary: The liver is unremarkable in appearance. Mild soft tissue inflammation about the gallbladder is nonspecific in the presence of ascites. The common bile duct remains normal in caliber. Trace ascites is noted about the liver. Pancreas: The pancreas is within normal limits. Spleen: Scattered calcified granulomata are noted within the spleen. The spleen is otherwise unremarkable. Adrenals/Urinary Tract: The adrenal glands are unremarkable in appearance. Nonspecific perinephric stranding is noted bilaterally. There is no evidence of hydronephrosis. Mild scarring is noted at the lower pole of the kidneys. No renal or ureteral stones are identified. Stomach/Bowel: The stomach is unremarkable in appearance. The small bowel is within normal limits. The appendix is normal in caliber, without evidence of appendicitis. The colon is unremarkable in appearance. Vascular/Lymphatic: Scattered calcification is seen along the abdominal aorta and its branches. The abdominal aorta is otherwise grossly unremarkable. The inferior vena cava is grossly unremarkable. No retroperitoneal lymphadenopathy is seen. No pelvic sidewall lymphadenopathy is identified. Reproductive: Mild soft tissue inflammation about the bladder may reflect cystitis. The prostate remains normal in size, with scattered  brachytherapy seeds. Other: Mild soft tissue inflammation is noted along the patient's pannus. Musculoskeletal: No acute osseous abnormalities are identified. Facet disease is noted along the lumbar spine, with underlying vacuum phenomenon. The visualized musculature is unremarkable in appearance. IMPRESSION: 1. Small bilateral pleural effusions. Minimal right-sided airspace opacity may reflect mild interstitial edema. 2. Mild soft tissue inflammation about the bladder may reflect cystitis. 3. Diffuse coronary artery calcifications. 4. Changes of prior granulomatous disease, with calcified mediastinal and hilar nodes. 5. Mild soft tissue inflammation about the gallbladder is nonspecific in the presence of ascites. Trace ascites noted about the liver. 6. Mild soft tissue inflammation along the patient's pannus. Electronically Signed   By: Garald Balding M.D.   On: 06/10/2017 23:28    Assessment and Plan:   1. New onset Congestive Heart Failure  - possibly secondary to Afib - patient endorses worsening DOE with progressive weight gain - BNP 930, CXR shows cardiomegaly with BL pleural effusions - troponin x 3 negative  - Echo 11/13/13: EF 50-55% with G1DD, repeat ordered - IV Lasix 20 mg BID, consider increase to 40 mg IV BID - strict I&Os, daily weights - continuous pulse ox, add O2 as sats may be dropping w/ activity  - Monitor electrolytes, BMP q AM    2. Persistent Atrial Fibrillation  -  previously noted to be paroxysmal, but has been persistent - continue Eliquis for anticoagulation, no doses missed - Continue Bisoprolol for rate control, but may have to decrease the dose for hypotension - cardioversion scheduled now for 06/15/17  3. AKI - BUN:Cr > 20:1 - on admission BUN/Cr: 32/1.29; last Cr noted to be 0.8 on 06/08/17  - Repeat BMP q AM  - Avoid nephrotoxic agents, not on ACE/ARB  Following as managed per primary team:  4. COPD 5. BPH  6. Anemia    For questions or updates, please  contact [facility] Please consult [email or web address] for contact info under Cardiology/STEMI.   Signed, Katina Degree, Student-PA  06/11/2017 10:49 AM   I have seen and examined the patient along with Katina Degree, Student-PA and Rosaria Ferries, PA-C.  I have reviewed the chart, notes and new data.  I agree with PA's note.  Key new complaints: progressive weight gain and abdominal distention, worsening dyspnea and now frank orthopnea, without angina or much in the way of leg edema Key examination changes: irregular rhythm, split S2, JVP almost to jaw, clear lungs, no pedal edema Key new findings / data: atrial fibrillation with borderline rate control at rest, prompt RVR with activity, labs and CXR reviewed  PLAN: Needs substantial diuresis before cardioversion (currently scheduled for 3/19). Appropriately anticoagulated. Diuresing well so far, after a small dose of IV furosemide. Low BP limits use of HF meds. Mild renal insufficiency - suspect it will improve with diuresis and better rate control. Echo pending, it's quite possible he has developed LV dysfunction due to tachycardia-related cardiomyopathy.  Sanda Klein, MD, [place] [phone] 06/11/2017, 1:46 PM

## 2017-06-11 NOTE — Progress Notes (Signed)
New admit to room 14, alert and oriented male with daughter at side. Pt 02 dependent at this time on 2/L [place]. Skin intact, warm and dry. Left anklr/foot marginally edematous. No edema noted to right ankle/foot. Skin intact. Pt alert and oriented x 4.  Pt able to stand for weight. No c/o pain or discomfort.

## 2017-06-11 NOTE — H&P (Signed)
History and Physical    Donald [name]:[id number] DOB: [birth year] DOA: 06/10/2017  Referring MD/NP/PA: Lorenza Cambridge PCP: Haywood Pao, MD  Patient coming from: Home  Chief Complaint: Shortness of breath  I have personally briefly reviewed patient's old medical records in [place]   HPI: Donald [name] is a 84 y.o. male with medical history significant of CAD, ischemic cardiomyopathy, atrial fibrillation on Eliquis followed by Dr. [name], prostate cancer s/p APC; who presented with complaints of progressively worsening shortness of breath over the last 2 weeks.  At home he had been noticing that his heart rates were elevated into the 130s.  Symptoms of dyspnea on exertion significantly worsened 4 days ago.  He was evaluated by cardiology 2 days ago and due to the persistent atrial fibrillation started on bisprolol.  They had planned on doing cardioversion sometime next week.  He followed up yesterday and due to worsening shortness of breath was started on Lasix and potassium chloride.  They advised him to hold the ramipril due to soft blood pressures.  Associated symptoms include generalized fatigue, lower extremity swelling, chest tightness, progressively worsening abdominal distention, reported 25 pound weight gain since January, and intermittent wheezing.  Denies having any significant chest pain, cough, orthopnea, nausea, vomiting, fever, chills, diarrhea, constipation, or loss of consciousness.  ED Course: Upon admission into the emergency department patient was seen to be still in atrial fibrillation with heart rates up to 136 and soft blood pressures.  Labs revealed hemoglobin 12.1, BUN 32, creatinine 1.29, BNP 930, and troponin negative.  Chest x-ray showed bilateral pleural effusions with right airspace opacity concerning for mild edema.  Patient was given initially 20 mg of potassium orally.  TRH called to admit.   Review of Systems  Constitutional: Positive for  malaise/fatigue. Negative for chills and fever.  HENT: Negative for ear discharge and nosebleeds.   Eyes: Negative for pain and discharge.  Respiratory: Positive for shortness of breath and wheezing. Negative for cough.   Cardiovascular: Positive for palpitations. Negative for chest pain and leg swelling.  Gastrointestinal: Negative for abdominal pain, diarrhea, nausea and vomiting.  Genitourinary: Negative for dysuria and hematuria.  Musculoskeletal: Negative for falls and myalgias.  Skin: Negative for itching and rash.  Neurological: Negative for sensory change and loss of consciousness.  Psychiatric/Behavioral: Negative for memory loss and substance abuse.    Past Medical History:  Diagnosis Date  . Asthma    with worsening with beta blockade  . CAD (coronary artery disease)   . CVA (cerebral infarction) 2012   tia, mild no residual defecits  . GI bleed 8 yrs ago   due to doll fully vessel which was clipped  . Glaucoma    excellent cataracts  . History of kidney stones   . Ischemic cardiomyopathy   . Nephrolithiasis   . 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 Prostate cancer ([place]) 2012  . Radiation proctitis Feb 2015   treated with APC  . Tubular adenoma 04/2013   Dr. Hilarie Fredrickson    Past Surgical History:  Procedure Laterality Date  . cardiac stents  2003  . COLONOSCOPY WITH PROPOFOL N/A 05/05/2013   Procedure: COLONOSCOPY WITH PROPOFOL;  Surgeon: Jerene Bears, MD;  Location: [facility];  Service: Gastroenterology;  Laterality: N/A;  . CORONARY ARTERY BYPASS GRAFT  1998   LIMA to the LAD and saphenous vein graft tothe diagonal  . EXTRACORPOREAL SHOCK WAVE LITHOTRIPSY Right 04/29/2017   Procedure: RIGHT EXTRACORPOREAL SHOCK WAVE  LITHOTRIPSY (ESWL);  Surgeon: Alexis Frock, MD;  Location: [facility];  Service: Urology;  Laterality: Right;  . history of radiation treatment  2012   x 40 treatments done  . KNEE ARTHROSCOPY  2016 or 2017   Dr Gladstone Lighter ;   . PARTIAL KNEE  ARTHROPLASTY Right 01/13/2017   Procedure: UNICOMPARTMENTAL RIGHT KNEE;  Surgeon: Gaynelle Arabian, MD;  Location: [facility];  Service: Orthopedics;  Laterality: Right;  with block     reports that  has never smoked. he has never used smokeless tobacco. He reports that he drinks alcohol. He reports that he does not use drugs.  Allergies  Allergen Reactions  . Keflex [Cephalexin] Nausea And Vomiting and Other (See Comments)    'Killed all GI enzymes" (also) and patient prefers to not take this  . Morphine Nausea Only    Family History  Problem Relation Age of Onset  . Heart attack Father   . Heart disease Mother   . Stomach cancer Mother     Prior to Admission medications   Medication Sig Start Date End Date Taking? Authorizing Provider  albuterol (PROVENTIL HFA;VENTOLIN HFA) 108 (90 Base) MCG/ACT inhaler Inhale 1 puff into the lungs every 6 (six) hours as needed for wheezing or shortness of breath.   Yes [provider]  apixaban (ELIQUIS) 5 MG TABS tablet Take 5 mg by mouth 2 (two) times daily.   Yes [provider]  bisoprolol (ZEBETA) 5 MG tablet Take 1 tablet (5 mg total) by mouth daily. Patient taking differently: Take 5 mg by mouth at bedtime.  06/08/17 06/08/18 Yes Sherran Needs, NP  Cholecalciferol (VITAMIN D) 2000 UNITS tablet Take 2,000 Units by mouth daily.   Yes [provider]  fenofibrate micronized (LOFIBRA) 200 MG capsule Take 1 capsule (200 mg total) by mouth daily. 05/25/17  Yes Lelon Perla, MD  Fluticasone Furoate-Vilanterol (BREO ELLIPTA) 100-25 MCG/INH AEPB Inhale 1 puff into the lungs daily as needed (shortness of breath).    Yes [provider]  latanoprost (XALATAN) 0.005 % ophthalmic solution Place 1 drop into the left eye at bedtime.    Yes [provider]  loratadine (CLARITIN) 10 MG tablet Take 10 mg by mouth daily.   Yes [provider]  Multiple Vitamin (MULTIVITAMIN WITH MINERALS) TABS tablet Take 1  tablet by mouth daily.   Yes [provider]  pravastatin (PRAVACHOL) 40 MG tablet Take 1 tablet (40 mg total) by mouth at bedtime. 01/16/14  Yes Lelon Perla, MD  Tamsulosin HCl (FLOMAX) 0.4 MG CAPS Take 0.4 mg by mouth every evening.    Yes [provider]  furosemide (LASIX) 20 MG tablet Take 20 mg by mouth twice daily for 3 days, then take 20 mg by mouth daily 06/10/17   Sherran Needs, NP  potassium chloride (K-DUR) 10 MEQ tablet Take 1 tablet (10 mEq total) by mouth daily. 06/10/17   Sherran Needs, NP  ramipril (ALTACE) 2.5 MG capsule Take 1 capsule (2.5 mg total) by mouth daily. 02/26/17   Lelon Perla, MD    Physical Exam:  Constitutional: Elderly male in no significant distress Vitals:   06/10/17 2200 06/10/17 2215 06/10/17 2245 06/10/17 2330  BP: 103/90 97/73 96/62  99/74  Pulse: (!) 56 69 (!) 108 78  Resp: 15 (!) 21 (!) 22 (!) 21  Temp:      TempSrc:      SpO2: 99% 99% 97% 97%  Weight:  Height:       Eyes: PERRL, lids and conjunctivae normal ENMT: Mucous membranes are dry. Posterior pharynx clear of any exudate or lesions.  Neck: normal, supple, no masses, no thyromegaly Respiratory: Some mild crackles noted in the mid lung fields with decreased aeration at the bases.  No acute significant wheezes appreciated.   Cardiovascular: Irregular irregular.  No rubs / gallops. +1 pitting bilateral lower extremity edema. 2+ pedal pulses. No carotid bruits.  Abdomen: Abdominal distention present with positive fluid wave.  No tenderness, no masses palpated. No hepatosplenomegaly. Bowel sounds positive.  Musculoskeletal: no clubbing / cyanosis. No joint deformity upper and lower extremities. Good ROM, no contractures. Normal muscle tone.  Skin: no rashes, lesions, ulcers. No induration Neurologic: CN 2-12 grossly intact. Sensation intact, DTR normal. Strength 5/5 in all 4.  Psychiatric: Normal judgment and insight. Alert and oriented x 3. Normal mood.      Labs on Admission: I have personally reviewed following labs and imaging studies  CBC: Recent Labs  Lab 06/08/17 1506 06/10/17 2114  WBC 4.5 5.9  HGB 12.6* 12.1*  HCT 39.5 37.9*  MCV 97.8 95.5  PLT 214 948   Basic Metabolic Panel: Recent Labs  Lab 06/08/17 1506 06/10/17 2114  NA 142 139  K 4.1 4.4  CL 110 107  CO2 25 22  GLUCOSE 91 137*  BUN 17 32*  CREATININE 0.84 1.29*  CALCIUM 9.1 9.5   GFR: Estimated Creatinine Clearance: 44.6 mL/min (A) (by C-G formula based on SCr of 1.29 mg/dL (H)). Liver Function Tests: Recent Labs  Lab 06/08/17 1506  AST 24  ALT 16*  ALKPHOS 34*  BILITOT 0.7  PROT 6.0*  ALBUMIN 3.5   Recent Labs  Lab 06/10/17 2227  LIPASE 33   No results for input(s): AMMONIA in the last 168 hours. Coagulation Profile: No results for input(s): INR, PROTIME in the last 168 hours. Cardiac Enzymes: No results for input(s): CKTOTAL, CKMB, CKMBINDEX, TROPONINI in the last 168 hours. BNP (last 3 results) No results for input(s): PROBNP in the last 8760 hours. HbA1C: No results for input(s): HGBA1C in the last 72 hours. CBG: No results for input(s): GLUCAP in the last 168 hours. Lipid Profile: No results for input(s): CHOL, HDL, LDLCALC, TRIG, CHOLHDL, LDLDIRECT in the last 72 hours. Thyroid Function Tests: Recent Labs    06/08/17 1506  TSH 6.106*   Anemia Panel: No results for input(s): VITAMINB12, FOLATE, FERRITIN, TIBC, IRON, RETICCTPCT in the last 72 hours. Urine analysis: No results found for: COLORURINE, APPEARANCEUR, LABSPEC, PHURINE, GLUCOSEU, HGBUR, BILIRUBINUR, KETONESUR, PROTEINUR, UROBILINOGEN, NITRITE, LEUKOCYTESUR Sepsis Labs: No results found for this or any previous visit (from the past 240 hour(s)).   Radiological Exams on Admission: Dg Chest 2 View  Result Date: 06/10/2017 CLINICAL DATA:  Shortness of breath, checkup EXAM: CHEST - 2 VIEW COMPARISON:  Chest x-ray of 09/17/2011 FINDINGS: There are small pleural  effusions present right slightly larger than left. There is also cardiomegaly present, findings which may indicate mild congestive heart failure. No edema is seen. Median sternotomy sutures are noted from prior CABG. There are degenerative changes in the mid to lower thoracic spine. IMPRESSION: 1. Small bilateral pleural pleural effusions with cardiomegaly. Question mild CHF. 2. Prior CABG. Electronically Signed   By: Ivar Drape M.D.   On: 06/10/2017 13:39   Ct Chest W Contrast  Result Date: 06/10/2017 CLINICAL DATA:  Subacute onset of shortness of breath and generalized chest tightness. Atrial fibrillation. Recent weight gain. EXAM:  CT CHEST, ABDOMEN, AND PELVIS WITH CONTRAST TECHNIQUE: Multidetector CT imaging of the chest, abdomen and pelvis was performed following the standard protocol during bolus administration of intravenous contrast. CONTRAST:  169mL ISOVUE-300 IOPAMIDOL (ISOVUE-300) INJECTION 61% COMPARISON:  CT of the abdomen and pelvis performed 03/12/2017 FINDINGS: CT CHEST FINDINGS Cardiovascular: The heart is normal in size. Diffuse coronary artery calcifications are seen. The patient is status post median sternotomy. Mild calcification is noted along the aortic arch and proximal great vessels. Mediastinum/Nodes: Scattered calcified mediastinal and hilar nodes are noted. No pericardial effusion is identified. No mediastinal lymphadenopathy is seen. The thyroid gland is unremarkable. No axillary lymphadenopathy is appreciated. Lungs/Pleura: Small bilateral pleural effusions are noted. Minimal right-sided airspace opacity may reflect mild interstitial edema. No pneumothorax is seen. No masses are identified. Musculoskeletal: No acute osseous abnormalities are identified. The visualized musculature is unremarkable in appearance. CT ABDOMEN PELVIS FINDINGS Hepatobiliary: The liver is unremarkable in appearance. Mild soft tissue inflammation about the gallbladder is nonspecific in the presence of  ascites. The common bile duct remains normal in caliber. Trace ascites is noted about the liver. Pancreas: The pancreas is within normal limits. Spleen: Scattered calcified granulomata are noted within the spleen. The spleen is otherwise unremarkable. Adrenals/Urinary Tract: The adrenal glands are unremarkable in appearance. Nonspecific perinephric stranding is noted bilaterally. There is no evidence of hydronephrosis. Mild scarring is noted at the lower pole of the kidneys. No renal or ureteral stones are identified. Stomach/Bowel: The stomach is unremarkable in appearance. The small bowel is within normal limits. The appendix is normal in caliber, without evidence of appendicitis. The colon is unremarkable in appearance. Vascular/Lymphatic: Scattered calcification is seen along the abdominal aorta and its branches. The abdominal aorta is otherwise grossly unremarkable. The inferior vena cava is grossly unremarkable. No retroperitoneal lymphadenopathy is seen. No pelvic sidewall lymphadenopathy is identified. Reproductive: Mild soft tissue inflammation about the bladder may reflect cystitis. The prostate remains normal in size, with scattered brachytherapy seeds. Other: Mild soft tissue inflammation is noted along the patient's pannus. Musculoskeletal: No acute osseous abnormalities are identified. Facet disease is noted along the lumbar spine, with underlying vacuum phenomenon. The visualized musculature is unremarkable in appearance. IMPRESSION: 1. Small bilateral pleural effusions. Minimal right-sided airspace opacity may reflect mild interstitial edema. 2. Mild soft tissue inflammation about the bladder may reflect cystitis. 3. Diffuse coronary artery calcifications. 4. Changes of prior granulomatous disease, with calcified mediastinal and hilar nodes. 5. Mild soft tissue inflammation about the gallbladder is nonspecific in the presence of ascites. Trace ascites noted about the liver. 6. Mild soft tissue  inflammation along the patient's pannus. Electronically Signed   By: Garald Balding M.D.   On: 06/10/2017 23:28   Ct Abdomen Pelvis W Contrast  Result Date: 06/10/2017 CLINICAL DATA:  Subacute onset of shortness of breath and generalized chest tightness. Atrial fibrillation. Recent weight gain. EXAM: CT CHEST, ABDOMEN, AND PELVIS WITH CONTRAST TECHNIQUE: Multidetector CT imaging of the chest, abdomen and pelvis was performed following the standard protocol during bolus administration of intravenous contrast. CONTRAST:  151mL ISOVUE-300 IOPAMIDOL (ISOVUE-300) INJECTION 61% COMPARISON:  CT of the abdomen and pelvis performed 03/12/2017 FINDINGS: CT CHEST FINDINGS Cardiovascular: The heart is normal in size. Diffuse coronary artery calcifications are seen. The patient is status post median sternotomy. Mild calcification is noted along the aortic arch and proximal great vessels. Mediastinum/Nodes: Scattered calcified mediastinal and hilar nodes are noted. No pericardial effusion is identified. No mediastinal lymphadenopathy is seen. The thyroid gland is  unremarkable. No axillary lymphadenopathy is appreciated. Lungs/Pleura: Small bilateral pleural effusions are noted. Minimal right-sided airspace opacity may reflect mild interstitial edema. No pneumothorax is seen. No masses are identified. Musculoskeletal: No acute osseous abnormalities are identified. The visualized musculature is unremarkable in appearance. CT ABDOMEN PELVIS FINDINGS Hepatobiliary: The liver is unremarkable in appearance. Mild soft tissue inflammation about the gallbladder is nonspecific in the presence of ascites. The common bile duct remains normal in caliber. Trace ascites is noted about the liver. Pancreas: The pancreas is within normal limits. Spleen: Scattered calcified granulomata are noted within the spleen. The spleen is otherwise unremarkable. Adrenals/Urinary Tract: The adrenal glands are unremarkable in appearance. Nonspecific  perinephric stranding is noted bilaterally. There is no evidence of hydronephrosis. Mild scarring is noted at the lower pole of the kidneys. No renal or ureteral stones are identified. Stomach/Bowel: The stomach is unremarkable in appearance. The small bowel is within normal limits. The appendix is normal in caliber, without evidence of appendicitis. The colon is unremarkable in appearance. Vascular/Lymphatic: Scattered calcification is seen along the abdominal aorta and its branches. The abdominal aorta is otherwise grossly unremarkable. The inferior vena cava is grossly unremarkable. No retroperitoneal lymphadenopathy is seen. No pelvic sidewall lymphadenopathy is identified. Reproductive: Mild soft tissue inflammation about the bladder may reflect cystitis. The prostate remains normal in size, with scattered brachytherapy seeds. Other: Mild soft tissue inflammation is noted along the patient's pannus. Musculoskeletal: No acute osseous abnormalities are identified. Facet disease is noted along the lumbar spine, with underlying vacuum phenomenon. The visualized musculature is unremarkable in appearance. IMPRESSION: 1. Small bilateral pleural effusions. Minimal right-sided airspace opacity may reflect mild interstitial edema. 2. Mild soft tissue inflammation about the bladder may reflect cystitis. 3. Diffuse coronary artery calcifications. 4. Changes of prior granulomatous disease, with calcified mediastinal and hilar nodes. 5. Mild soft tissue inflammation about the gallbladder is nonspecific in the presence of ascites. Trace ascites noted about the liver. 6. Mild soft tissue inflammation along the patient's pannus. Electronically Signed   By: Garald Balding M.D.   On: 06/10/2017 23:28    EKG: Independently reviewed.  Atrial fibrillation at 90 bpm  Assessment/Plan New onset congestive heart failure: Acute.  Patient presents with complaints of shortness of breath and progressive weight gain.  BNP 930.7 with  maging showing cardiomegaly with showing bilateral pleural effusions.  Patient was given 20 mg of Lasix IV while in the emergency department due to soft blood pressures. - Admit to a telemetry bed - Heart failure orders set  initiated  - Continuous pulse oximetry with nasal cannula oxygen as needed to keep O2 saturations >92% - Strict I&Os and daily weights - Elevate lower extremities - Add on TSH and trend troponin - Lasix 20 mg IV BID - Reassess in a.m. and adjust diuresis as needed. - Check echocardiogram - Optimize medical management as able - Message sent for cardiology to evaluate in a.m.   Atrial fibrillation: Previously noted to be paroxysmal patient has been in persistent atrial fibrillation over the last several days. - Continue bisoprolol and Eliquis - Will need to discuss with cardiology need of cardioversion   Acute kidney injury: Patient presents with BUN 32 and creatinine 1.29.  Patient's last creatinine noted to be 0.8.  Suspect acute kidney injury secondary to hypoperfusion related to CHF although appears prerenal with elevated BUN to creatinine ratio. - Recheck BMP in a.m. - Hold nephrotoxic agents  COPD, without acute exacerbation - Continue Breo - DuoNeb's as needed shortness  of breath/wheezing  BPH - Continue Flomax  Normocytic normochromic anemia: Hemoglobin 12.1 - Recheck CBC  DVT prophylaxis: Eliquis Code Status: DNR Family Communication: Discussed plan of care with the patient and family present at bedside Disposition Plan: Likely discharge home in 2-3 days Consults called: none  Admission status: inpatient  Norval Morton MD [facility] Pager [phone]   If 7PM-7AM, please contact night-coverage [email or web address] Password [facility]  06/11/2017, 12:04 AM

## 2017-06-12 LAB — BASIC METABOLIC PANEL
ANION GAP: 8 (ref 5–15)
BUN: 32 mg/dL — ABNORMAL HIGH (ref 6–20)
CHLORIDE: 107 mmol/L (ref 101–111)
CO2: 25 mmol/L (ref 22–32)
Calcium: 9 mg/dL (ref 8.9–10.3)
Creatinine, Ser: 1.35 mg/dL — ABNORMAL HIGH (ref 0.61–1.24)
GFR calc Af Amer: 52 mL/min — ABNORMAL LOW (ref >60)
GFR calc non Af Amer: 45 mL/min — ABNORMAL LOW (ref >60)
GLUCOSE: 116 mg/dL — AB (ref 65–99)
POTASSIUM: 3.9 mmol/L (ref 3.5–5.1)
Sodium: 140 mmol/L (ref 135–145)

## 2017-06-12 NOTE — Progress Notes (Signed)
Patient resting comfortably during shift report. Denies complaints.  

## 2017-06-12 NOTE — Evaluation (Signed)
Physical Therapy Evaluation Patient Details Name: Donald [name] MRN: [id number] DOB: Sep 19, 1926 Today's Date: 06/12/2017   History of Present Illness  84 yo male with onset of new CHF dx was admitted, has SOB, AKI, A-fib, on 2L O2, with PMHx:  CVA, CAD, asthma, ischemic cardiac, GI bleed, a-fib, prostate CA, tubular adenoma,  Clinical Impression  Pt is demonstrating significant elevation of HR with all effort up to 158 today.  He is still scheduled for cardioversion this next week, and PT will remain involved with his care to monitor and work on endurance and tolerance for activity. Will upgrade DC plan if needed based on his evolving care needs.  For now anticipate HHPT to see for progressing home activity and will work on the goals of endurance and safety with gait regarding his cardiac function.      Follow Up Recommendations Home health PT;Supervision - Intermittent    Equipment Recommendations  None recommended by PT    Recommendations for Other Services       Precautions / Restrictions Precautions Precautions: Fall(telemetry) Restrictions Weight Bearing Restrictions: No      Mobility  Bed Mobility Overal bed mobility: Modified Independent                Transfers Overall transfer level: Modified independent Equipment used: None                Ambulation/Gait Ambulation/Gait assistance: Min guard Ambulation Distance (Feet): 200 Feet Assistive device: 1 person hand held assist Gait Pattern/deviations: Step-through pattern;Wide base of support;Decreased stride length Gait velocity: reduced Gait velocity interpretation: Below normal speed for age/gender General Gait Details: pt was able to walk in a stable gait pattern but noted his pulse was up to 158 and sats down to 89% during the walk, recovered to 96% afterward and pulse was still high at 126 after sitting  Stairs            Wheelchair Mobility    Modified Rankin (Stroke Patients Only)        Balance Overall balance assessment: Modified Independent                                           Pertinent Vitals/Pain Pain Assessment: No/denies pain    Home Living Family/patient expects to be discharged to:: Private residence Living Arrangements: Alone Available Help at Discharge: Family Type of Home: House Home Access: Ramped entrance     Home Layout: Able to live on main level with bedroom/bathroom Home Equipment: Walker - 2 wheels;Grab bars - toilet;Grab bars - tub/shower      Prior Function Level of Independence: Independent               Hand Dominance        Extremity/Trunk Assessment   Upper Extremity Assessment Upper Extremity Assessment: Overall WFL for tasks assessed    Lower Extremity Assessment Lower Extremity Assessment: Overall WFL for tasks assessed    Cervical / Trunk Assessment Cervical / Trunk Assessment: Normal  Communication   Communication: No difficulties  Cognition Arousal/Alertness: Awake/alert Behavior During Therapy: WFL for tasks assessed/performed Overall Cognitive Status: Within Functional Limits for tasks assessed  General Comments      Exercises     Assessment/Plan    PT Assessment Patient needs continued PT services  PT Problem List Decreased activity tolerance;Decreased balance;Decreased mobility;Cardiopulmonary status limiting activity       PT Treatment Interventions DME instruction;Gait training;Stair training;Functional mobility training;Therapeutic activities;Therapeutic exercise;Balance training;Neuromuscular re-education;Patient/family education    PT Goals (Current goals can be found in the Care Plan section)  Acute Rehab PT Goals Patient Stated Goal: to get stronger and see his doctor from home PT Goal Formulation: With patient Time For Goal Achievement: 06/26/17 Potential to Achieve Goals: Good    Frequency Min  3X/week   Barriers to discharge Decreased caregiver support home alone    Co-evaluation               AM-PAC PT "6 Clicks" Daily Activity  Outcome Measure Difficulty turning over in bed (including adjusting bedclothes, sheets and blankets)?: None Difficulty moving from lying on back to sitting on the side of the bed? : None Difficulty sitting down on and standing up from a chair with arms (e.g., wheelchair, bedside commode, etc,.)?: A Little Help needed moving to and from a bed to chair (including a wheelchair)?: A Little Help needed walking in hospital room?: A Little Help needed climbing 3-5 steps with a railing? : A Little 6 Click Score: 20    End of Session Equipment Utilized During Treatment: Gait belt Activity Tolerance: Treatment limited secondary to medical complications (Comment) Patient left: in bed;with call bell/phone within reach Nurse Communication: Mobility status PT Visit Diagnosis: Other (comment)(cardiac symptoms of a-fib and desaturation of O2 )    Time: 9604-5409 PT Time Calculation (min) (ACUTE ONLY): 26 min   Charges:   PT Evaluation $PT Eval Moderate Complexity: 1 Mod PT Treatments $Gait Training: 8-22 mins   PT G Codes:   PT G-Codes **NOT FOR INPATIENT CLASS** Functional Assessment Tool Used: AM-PAC 6 Clicks Basic Mobility    Ramond Dial 06/12/2017, 3:39 PM   Mee Hives, PT MS Acute Rehab Dept. Number: [place] and [place]

## 2017-06-12 NOTE — Progress Notes (Signed)
PROGRESS NOTE    Donald [name]  GGY:[id number] DOB: 12-09-1926 DOA: 06/10/2017 PCP: Haywood Pao, MD   Brief Narrative:  84 y.o. male with medical history significant of CAD, ischemic cardiomyopathy, atrial fibrillation on Eliquis followed by Dr. [name], prostate cancer s/p APC; who presented with complaints of progressively worsening shortness of breath over the last 2 weeks  Pt noticed to have elevated HR's at home.   Assessment & Plan:   Principal Problem:   New onset of congestive heart failure ([place]) - Plan is to diurese prior to cardioversion which is scheduled for 06/15/2017. Currently low blood pressure limits use of heart failure meds.  cardiology consulted and managing  Active Problems:   AF (paroxysmal atrial fibrillation) ([place]) - We will controlled with bisoprolol, cardioversion scheduled for date listed above. Patient to be continued on eliquis for anticoagulation    AKI (acute kidney injury) ([place]) - Will avoid nephrotoxic agents ARB/ACEI - reassess serum creatinine next am    Normocytic normochromic anemia - hgb stable no active bleeding.   DVT prophylaxis: pt on eliquis Code Status: (DNR Family Communication: Discussed with patient and family member at bedside Disposition Plan: Pending final recommendations by cardiologist   Consultants:   Cardiology   Procedures: None   Antimicrobials: None   Subjective: Patient denies any chest pain currently  Objective: Vitals:   06/12/17 0011 06/12/17 0523 06/12/17 0700 06/12/17 0838  BP: (!) 86/70 99/71  100/73  Pulse: 66 79    Resp: 20 20    Temp: 98.1 F (36.7 C) 98.6 F (37 C)    TempSrc: Oral Oral    SpO2: 95% 98%    Weight:   86.4 kg (190 lb 8 oz)   Height:        Intake/Output Summary (Last 24 hours) at 06/12/2017 1630 Last data filed at 06/12/2017 0935 Gross per 24 hour  Intake 250 ml  Output 890 ml  Net -640 ml   Filed Weights   06/10/17 2059 06/11/17 1630 06/12/17 0700    Weight: 93 kg (205 lb) 87.8 kg (193 lb 9 oz) 86.4 kg (190 lb 8 oz)    Examination:  General exam: Appears calm and comfortable, no acute distress  Respiratory system: Clear to auscultation. Respiratory effort normal. Cardiovascular system: S1 & S2 heard, IRRR. No JVD, murmurs, rubs Gastrointestinal system: Abdomen is nondistended, soft and nontender. No organomegaly or masses felt. Normal bowel sounds heard. Central nervous system: Alert and oriented. No focal neurological deficits. Extremities: Symmetric 5 x 5 power. Skin: No rashes, lesions or ulcers, on limited exam Psychiatry: Mood & affect appropriate.     Data Reviewed: I have personally reviewed following labs and imaging studies  CBC: Recent Labs  Lab 06/08/17 1506 06/10/17 2114 06/11/17 0606  WBC 4.5 5.9 5.1  NEUTROABS  --   --  2.8  HGB 12.6* 12.1* 11.6*  HCT 39.5 37.9* 36.5*  MCV 97.8 95.5 96.3  PLT 214 210 035   Basic Metabolic Panel: Recent Labs  Lab 06/08/17 1506 06/10/17 2114 06/11/17 0606 06/11/17 1301 06/12/17 0502  NA 142 139  --  139 140  K 4.1 4.4  --  4.1 3.9  CL 110 107  --  106 107  CO2 25 22  --  22 25  GLUCOSE 91 137*  --  95 116*  BUN 17 32*  --  30* 32*  CREATININE 0.84 1.29*  --  1.22 1.35*  CALCIUM 9.1 9.5  --  9.4 9.0  MG  --   --  1.7  --   --    GFR: Estimated Creatinine Clearance: 41.2 mL/min (A) (by C-G formula based on SCr of 1.35 mg/dL (H)). Liver Function Tests: Recent Labs  Lab 06/08/17 1506  AST 24  ALT 16*  ALKPHOS 34*  BILITOT 0.7  PROT 6.0*  ALBUMIN 3.5   Recent Labs  Lab 06/10/17 2227  LIPASE 33   No results for input(s): AMMONIA in the last 168 hours. Coagulation Profile: No results for input(s): INR, PROTIME in the last 168 hours. Cardiac Enzymes: Recent Labs  Lab 06/11/17 0207 06/11/17 0606 06/11/17 1301  TROPONINI <0.03 0.03* 0.03*   BNP (last 3 results) No results for input(s): PROBNP in the last 8760 hours. HbA1C: No results for  input(s): HGBA1C in the last 72 hours. CBG: No results for input(s): GLUCAP in the last 168 hours. Lipid Profile: No results for input(s): CHOL, HDL, LDLCALC, TRIG, CHOLHDL, LDLDIRECT in the last 72 hours. Thyroid Function Tests: Recent Labs    06/10/17 0112  TSH 9.474*   Anemia Panel: No results for input(s): VITAMINB12, FOLATE, FERRITIN, TIBC, IRON, RETICCTPCT in the last 72 hours. Sepsis Labs: No results for input(s): PROCALCITON, LATICACIDVEN in the last 168 hours.  No results found for this or any previous visit (from the past 240 hour(s)).       Radiology Studies: Ct Chest W Contrast  Result Date: 06/10/2017 CLINICAL DATA:  Subacute onset of shortness of breath and generalized chest tightness. Atrial fibrillation. Recent weight gain. EXAM: CT CHEST, ABDOMEN, AND PELVIS WITH CONTRAST TECHNIQUE: Multidetector CT imaging of the chest, abdomen and pelvis was performed following the standard protocol during bolus administration of intravenous contrast. CONTRAST:  15mL ISOVUE-300 IOPAMIDOL (ISOVUE-300) INJECTION 61% COMPARISON:  CT of the abdomen and pelvis performed 03/12/2017 FINDINGS: CT CHEST FINDINGS Cardiovascular: The heart is normal in size. Diffuse coronary artery calcifications are seen. The patient is status post median sternotomy. Mild calcification is noted along the aortic arch and proximal great vessels. Mediastinum/Nodes: Scattered calcified mediastinal and hilar nodes are noted. No pericardial effusion is identified. No mediastinal lymphadenopathy is seen. The thyroid gland is unremarkable. No axillary lymphadenopathy is appreciated. Lungs/Pleura: Small bilateral pleural effusions are noted. Minimal right-sided airspace opacity may reflect mild interstitial edema. No pneumothorax is seen. No masses are identified. Musculoskeletal: No acute osseous abnormalities are identified. The visualized musculature is unremarkable in appearance. CT ABDOMEN PELVIS FINDINGS  Hepatobiliary: The liver is unremarkable in appearance. Mild soft tissue inflammation about the gallbladder is nonspecific in the presence of ascites. The common bile duct remains normal in caliber. Trace ascites is noted about the liver. Pancreas: The pancreas is within normal limits. Spleen: Scattered calcified granulomata are noted within the spleen. The spleen is otherwise unremarkable. Adrenals/Urinary Tract: The adrenal glands are unremarkable in appearance. Nonspecific perinephric stranding is noted bilaterally. There is no evidence of hydronephrosis. Mild scarring is noted at the lower pole of the kidneys. No renal or ureteral stones are identified. Stomach/Bowel: The stomach is unremarkable in appearance. The small bowel is within normal limits. The appendix is normal in caliber, without evidence of appendicitis. The colon is unremarkable in appearance. Vascular/Lymphatic: Scattered calcification is seen along the abdominal aorta and its branches. The abdominal aorta is otherwise grossly unremarkable. The inferior vena cava is grossly unremarkable. No retroperitoneal lymphadenopathy is seen. No pelvic sidewall lymphadenopathy is identified. Reproductive: Mild soft tissue inflammation about the bladder may reflect cystitis. The prostate remains normal in size, with  scattered brachytherapy seeds. Other: Mild soft tissue inflammation is noted along the patient's pannus. Musculoskeletal: No acute osseous abnormalities are identified. Facet disease is noted along the lumbar spine, with underlying vacuum phenomenon. The visualized musculature is unremarkable in appearance. IMPRESSION: 1. Small bilateral pleural effusions. Minimal right-sided airspace opacity may reflect mild interstitial edema. 2. Mild soft tissue inflammation about the bladder may reflect cystitis. 3. Diffuse coronary artery calcifications. 4. Changes of prior granulomatous disease, with calcified mediastinal and hilar nodes. 5. Mild soft tissue  inflammation about the gallbladder is nonspecific in the presence of ascites. Trace ascites noted about the liver. 6. Mild soft tissue inflammation along the patient's pannus. Electronically Signed   By: Garald Balding M.D.   On: 06/10/2017 23:28   Ct Abdomen Pelvis W Contrast  Result Date: 06/10/2017 CLINICAL DATA:  Subacute onset of shortness of breath and generalized chest tightness. Atrial fibrillation. Recent weight gain. EXAM: CT CHEST, ABDOMEN, AND PELVIS WITH CONTRAST TECHNIQUE: Multidetector CT imaging of the chest, abdomen and pelvis was performed following the standard protocol during bolus administration of intravenous contrast. CONTRAST:  112mL ISOVUE-300 IOPAMIDOL (ISOVUE-300) INJECTION 61% COMPARISON:  CT of the abdomen and pelvis performed 03/12/2017 FINDINGS: CT CHEST FINDINGS Cardiovascular: The heart is normal in size. Diffuse coronary artery calcifications are seen. The patient is status post median sternotomy. Mild calcification is noted along the aortic arch and proximal great vessels. Mediastinum/Nodes: Scattered calcified mediastinal and hilar nodes are noted. No pericardial effusion is identified. No mediastinal lymphadenopathy is seen. The thyroid gland is unremarkable. No axillary lymphadenopathy is appreciated. Lungs/Pleura: Small bilateral pleural effusions are noted. Minimal right-sided airspace opacity may reflect mild interstitial edema. No pneumothorax is seen. No masses are identified. Musculoskeletal: No acute osseous abnormalities are identified. The visualized musculature is unremarkable in appearance. CT ABDOMEN PELVIS FINDINGS Hepatobiliary: The liver is unremarkable in appearance. Mild soft tissue inflammation about the gallbladder is nonspecific in the presence of ascites. The common bile duct remains normal in caliber. Trace ascites is noted about the liver. Pancreas: The pancreas is within normal limits. Spleen: Scattered calcified granulomata are noted within the  spleen. The spleen is otherwise unremarkable. Adrenals/Urinary Tract: The adrenal glands are unremarkable in appearance. Nonspecific perinephric stranding is noted bilaterally. There is no evidence of hydronephrosis. Mild scarring is noted at the lower pole of the kidneys. No renal or ureteral stones are identified. Stomach/Bowel: The stomach is unremarkable in appearance. The small bowel is within normal limits. The appendix is normal in caliber, without evidence of appendicitis. The colon is unremarkable in appearance. Vascular/Lymphatic: Scattered calcification is seen along the abdominal aorta and its branches. The abdominal aorta is otherwise grossly unremarkable. The inferior vena cava is grossly unremarkable. No retroperitoneal lymphadenopathy is seen. No pelvic sidewall lymphadenopathy is identified. Reproductive: Mild soft tissue inflammation about the bladder may reflect cystitis. The prostate remains normal in size, with scattered brachytherapy seeds. Other: Mild soft tissue inflammation is noted along the patient's pannus. Musculoskeletal: No acute osseous abnormalities are identified. Facet disease is noted along the lumbar spine, with underlying vacuum phenomenon. The visualized musculature is unremarkable in appearance. IMPRESSION: 1. Small bilateral pleural effusions. Minimal right-sided airspace opacity may reflect mild interstitial edema. 2. Mild soft tissue inflammation about the bladder may reflect cystitis. 3. Diffuse coronary artery calcifications. 4. Changes of prior granulomatous disease, with calcified mediastinal and hilar nodes. 5. Mild soft tissue inflammation about the gallbladder is nonspecific in the presence of ascites. Trace ascites noted about the liver. 6. Mild  soft tissue inflammation along the patient's pannus. Electronically Signed   By: Garald Balding M.D.   On: 06/10/2017 23:28    Scheduled Meds: . apixaban  5 mg Oral BID  . bisoprolol  5 mg Oral QHS  . fenofibrate  160  mg Oral Daily  . furosemide  20 mg Intravenous BID  . latanoprost  1 drop Left Eye QHS  . loratadine  10 mg Oral Daily  . potassium chloride  10 mEq Oral Daily  . pravastatin  40 mg Oral QHS  . sodium chloride flush  3 mL Intravenous Q12H  . tamsulosin  0.4 mg Oral QPM   Continuous Infusions: . sodium chloride       LOS: 1 day    Time spent: 20 min    Velvet Bathe, MD [facility] Pager [phone]  If 7PM-7AM, please contact night-coverage [email or web address] Password [facility] 06/12/2017, 4:30 PM

## 2017-06-12 NOTE — Progress Notes (Signed)
Subjective:  He is still quite dyspneic with activity.  Atrial fibrillation rate tends to go up with exercise.  Mild dyspnea at rest.  Plans for cardioversion on the 19th.  Objective:  Vital Signs in the last 24 hours: BP 100/73 (BP Location: Left Arm)   Pulse 79   Temp 98.6 F (37 C) (Oral)   Resp 20   Ht 5\' 9"  (1.753 m)   Wt 86.4 kg (190 lb 8 oz)   SpO2 98%   BMI 28.13 kg/m   Physical Exam: Pleasant and conversant elderly white male in no acute distress Lungs:  Clear  Cardiac: rregular rhythm, normal S1 and S2, no S3 Abdomen:  Soft, nontender, no masses Extremities:  No edema present  Intake/Output from previous day: 03/15 0701 - 03/16 0700 In: 240 [P.O.:240] Out: 1740 [Urine:1740] Weight Filed Weights   06/10/17 2059 06/11/17 1630 06/12/17 0700  Weight: 93 kg (205 lb) 87.8 kg (193 lb 9 oz) 86.4 kg (190 lb 8 oz)    Lab Results: Basic Metabolic Panel: Recent Labs    06/11/17 1301 06/12/17 0502  NA 139 140  K 4.1 3.9  CL 106 107  CO2 22 25  GLUCOSE 95 116*  BUN 30* 32*  CREATININE 1.22 1.35*    CBC: Recent Labs    06/10/17 2114 06/11/17 0606  WBC 5.9 5.1  NEUTROABS  --  2.8  HGB 12.1* 11.6*  HCT 37.9* 36.5*  MCV 95.5 96.3  PLT 210 196    BNP    Component Value Date/Time   BNP 930.7 (H) 06/10/2017 2217    Cardiac Panel (last 3 results) Recent Labs    06/11/17 0207 06/11/17 0606 06/11/17 1301  TROPONINI <0.03 0.03* 0.03*   Telemetry: Atrial fibrillation response rapid at times  Assessment/Plan: 1.  Congestive heart failure echo pending diuresing some 2.  Persistent atrial fibrillation previously  has been paroxysmal cardioversion planned 43/19 3.  Acute on chronic kidney disease creatinine somewhat up  Recommendations  Weight down to 190. Suspect shortness of breath largely due to atrial fibrillation with rapid response and loss of atrial kick.  May need to cut diuretics back if creatinine continues to rise.   Kerry Hough  MD [facility]  06/12/2017, 12:49 PM

## 2017-06-12 NOTE — Plan of Care (Signed)
  Education: Knowledge of General Education information will improve 06/12/2017 0223 - Progressing by Tristan Schroeder, RN   Health Behavior/Discharge Planning: Ability to manage health-related needs will improve 06/12/2017 0223 - Progressing by Tristan Schroeder, RN   Clinical Measurements: Respiratory complications will improve 06/12/2017 0223 - Progressing by Tristan Schroeder, RN

## 2017-06-13 ENCOUNTER — Inpatient Hospital Stay (HOSPITAL_COMMUNITY): Payer: Medicare Other

## 2017-06-13 LAB — ECHOCARDIOGRAM COMPLETE
HEIGHTINCHES: 69 in
WEIGHTICAEL: 3049.6 [oz_av]

## 2017-06-13 LAB — BASIC METABOLIC PANEL
Anion gap: 7 (ref 5–15)
BUN: 37 mg/dL — AB (ref 6–20)
CO2: 27 mmol/L (ref 22–32)
CREATININE: 1.25 mg/dL — AB (ref 0.61–1.24)
Calcium: 9.3 mg/dL (ref 8.9–10.3)
Chloride: 105 mmol/L (ref 101–111)
GFR calc Af Amer: 57 mL/min — ABNORMAL LOW (ref >60)
GFR, EST NON AFRICAN AMERICAN: 50 mL/min — AB (ref >60)
GLUCOSE: 118 mg/dL — AB (ref 65–99)
Potassium: 4.1 mmol/L (ref 3.5–5.1)
Sodium: 139 mmol/L (ref 135–145)

## 2017-06-13 MED ORDER — PERFLUTREN LIPID MICROSPHERE
1.0000 mL | INTRAVENOUS | Status: AC | PRN
  Administered 2017-06-13: 8 mL via INTRAVENOUS
  Filled 2017-06-13: qty 10

## 2017-06-13 MED ORDER — FUROSEMIDE 10 MG/ML IJ SOLN
40.0000 mg | Freq: Two times a day (BID) | INTRAMUSCULAR | Status: DC
  Administered 2017-06-13 – 2017-06-16 (×6): 40 mg via INTRAVENOUS
  Filled 2017-06-13 (×6): qty 4

## 2017-06-13 NOTE — Progress Notes (Signed)
Patient resting comfortably during shift report. Denies complaints.  Held orders are for procedure on 3/19. Can be released on 3/18 if pt is still admitted.

## 2017-06-13 NOTE — Discharge Instructions (Signed)

## 2017-06-13 NOTE — Progress Notes (Signed)
Echocardiogram 2D Echocardiogram with contrast has been performed.  Donald [name] 06/13/2017, 9:00 AM

## 2017-06-13 NOTE — Progress Notes (Signed)
Subjective:  No change in weight overnight.  Still dyspneic with any level of exertion.  Creatinine slightly lower today.  No chest pain.  Objective:  Vital Signs in the last 24 hours: BP 98/68 (BP Location: Left Arm)   Pulse 89   Temp (!) 97.5 F (36.4 C) (Oral)   Resp 20   Ht 5\' 9"  (1.753 m)   Wt 86.5 kg (190 lb 9.6 oz) Comment: scale c  SpO2 96%   BMI 28.15 kg/m   Physical Exam: Pleasant and conversant elderly white male in no acute distress Lungs:  Clear  Cardiac: rregular rhythm, normal S1 and S2, no S3 Abdomen:  Soft, nontender, no masses Extremities:  No edema present  Intake/Output from previous day: 03/16 0701 - 03/17 0700 In: 500 [P.O.:480; I.V.:20] Out: 100 [Urine:100] Weight Filed Weights   06/11/17 1630 06/12/17 0700 06/13/17 0429  Weight: 87.8 kg (193 lb 9 oz) 86.4 kg (190 lb 8 oz) 86.5 kg (190 lb 9.6 oz)    Lab Results: Basic Metabolic Panel: Recent Labs    06/12/17 0502 06/13/17 0717  NA 140 139  K 3.9 4.1  CL 107 105  CO2 25 27  GLUCOSE 116* 118*  BUN 32* 37*  CREATININE 1.35* 1.25*    CBC: Recent Labs    06/10/17 2114 06/11/17 0606  WBC 5.9 5.1  NEUTROABS  --  2.8  HGB 12.1* 11.6*  HCT 37.9* 36.5*  MCV 95.5 96.3  PLT 210 196    BNP    Component Value Date/Time   BNP 930.7 (H) 06/10/2017 2217    Cardiac Panel (last 3 results) Recent Labs    06/11/17 0207 06/11/17 0606 06/11/17 1301  TROPONINI <0.03 0.03* 0.03*   Telemetry: Atrial fibrillation response rapid at times  Assessment/Plan: 1.  Congestive heart failure echo pending and was done this morning but not read yet.diuresing some but no change in weight 2.  Persistent atrial fibrillation previously  has been paroxysmal cardioversion planned 43/19 3.  Acute on chronic kidney disease creatinine down today  Recommendations  I'll increase diuretics a little bit but we'll need to watch his renal function carefully with this.  Suspected not a lot will change until he  gets back into sinus rhythm.   Kerry Hough  MD [facility]  06/13/2017, 11:44 AM

## 2017-06-13 NOTE — Progress Notes (Signed)
Phone call placed at bedside, to granddaughter RN Levada Dy (Case Manager at [facility]) to notify of EF and current lab values, as well as MD plan to continue toward DCCV on 3/19

## 2017-06-13 NOTE — Progress Notes (Signed)
PROGRESS NOTE    Donald [name]  XLK:[id number] DOB: [birth year] DOA: 06/10/2017 PCP: Haywood Pao, MD   Brief Narrative:  84 y.o. male with medical history significant of CAD, ischemic cardiomyopathy, atrial fibrillation on Eliquis followed by Dr. [name], prostate cancer s/p APC; who presented with complaints of progressively worsening shortness of breath over the last 2 weeks  Pt noticed to have elevated HR's at home.   Assessment & Plan:   Principal Problem:   New onset of congestive heart failure ([place]) - Plan is to continue to diurese prior to cardioversion which is scheduled for 06/15/2017. Cardiology consulted and managing.   Active Problems:   AF (paroxysmal atrial fibrillation) ([facility]) - We will controlled with bisoprolol, cardioversion scheduled for date listed above. Patient to be continued on eliquis for anticoagulation    AKI (acute kidney injury) ([place]) - Will avoid nephrotoxic agents ARB/ACEI - reassess serum creatinine next am    Normocytic normochromic anemia - hgb stable no active bleeding.  DVT prophylaxis: pt on eliquis Code Status: DNR Family Communication: Discussed with patient and family member at bedside Disposition Plan: Pending final recommendations by cardiologist   Consultants:   Cardiology   Procedures: None   Antimicrobials: None   Subjective: Pt has no new complaints, no acute issues reported overnight.  Objective: Vitals:   06/12/17 2039 06/13/17 0016 06/13/17 0429 06/13/17 0700  BP: 103/74 (!) 123/99 92/62 (!) 80/49  Pulse: 65 92 97 82  Resp: 20 20 20 20   Temp: 97.7 F (36.5 C) 97.6 F (36.4 C) 97.6 F (36.4 C) (!) 97.5 F (36.4 C)  TempSrc: Oral Oral Oral Oral  SpO2: 96% 97% 97% 96%  Weight:   86.5 kg (190 lb 9.6 oz)   Height:        Intake/Output Summary (Last 24 hours) at 06/13/2017 0901 Last data filed at 06/13/2017 0746 Gross per 24 hour  Intake 500 ml  Output 0 ml  Net 500 ml   Filed Weights   06/11/17  1630 06/12/17 0700 06/13/17 0429  Weight: 87.8 kg (193 lb 9 oz) 86.4 kg (190 lb 8 oz) 86.5 kg (190 lb 9.6 oz)    Examination: Exam unchanged when compared to prior on 06/12/17  General exam: Appears calm and comfortable, no acute distress  Respiratory system: Clear to auscultation. Respiratory effort normal. Cardiovascular system: S1 & S2 heard, IRRR. No JVD, murmurs, rubs Gastrointestinal system: Abdomen is nondistended, soft and nontender. No organomegaly or masses felt. Normal bowel sounds heard. Central nervous system: Alert and oriented. No focal neurological deficits. Extremities: Symmetric 5 x 5 power. Skin: No rashes, lesions or ulcers, on limited exam Psychiatry: Mood & affect appropriate.     Data Reviewed: I have personally reviewed following labs and imaging studies  CBC: Recent Labs  Lab 06/08/17 1506 06/10/17 2114 06/11/17 0606  WBC 4.5 5.9 5.1  NEUTROABS  --   --  2.8  HGB 12.6* 12.1* 11.6*  HCT 39.5 37.9* 36.5*  MCV 97.8 95.5 96.3  PLT 214 210 366   Basic Metabolic Panel: Recent Labs  Lab 06/08/17 1506 06/10/17 2114 06/11/17 0606 06/11/17 1301 06/12/17 0502 06/13/17 0717  NA 142 139  --  139 140 139  K 4.1 4.4  --  4.1 3.9 4.1  CL 110 107  --  106 107 105  CO2 25 22  --  22 25 27   GLUCOSE 91 137*  --  95 116* 118*  BUN 17 32*  --  30*  32* 37*  CREATININE 0.84 1.29*  --  1.22 1.35* 1.25*  CALCIUM 9.1 9.5  --  9.4 9.0 9.3  MG  --   --  1.7  --   --   --    GFR: Estimated Creatinine Clearance: 44.5 mL/min (A) (by C-G formula based on SCr of 1.25 mg/dL (H)). Liver Function Tests: Recent Labs  Lab 06/08/17 1506  AST 24  ALT 16*  ALKPHOS 34*  BILITOT 0.7  PROT 6.0*  ALBUMIN 3.5   Recent Labs  Lab 06/10/17 2227  LIPASE 33   No results for input(s): AMMONIA in the last 168 hours. Coagulation Profile: No results for input(s): INR, PROTIME in the last 168 hours. Cardiac Enzymes: Recent Labs  Lab 06/11/17 0207 06/11/17 0606  06/11/17 1301  TROPONINI <0.03 0.03* 0.03*   BNP (last 3 results) No results for input(s): PROBNP in the last 8760 hours. HbA1C: No results for input(s): HGBA1C in the last 72 hours. CBG: No results for input(s): GLUCAP in the last 168 hours. Lipid Profile: No results for input(s): CHOL, HDL, LDLCALC, TRIG, CHOLHDL, LDLDIRECT in the last 72 hours. Thyroid Function Tests: No results for input(s): TSH, T4TOTAL, FREET4, T3FREE, THYROIDAB in the last 72 hours. Anemia Panel: No results for input(s): VITAMINB12, FOLATE, FERRITIN, TIBC, IRON, RETICCTPCT in the last 72 hours. Sepsis Labs: No results for input(s): PROCALCITON, LATICACIDVEN in the last 168 hours.  No results found for this or any previous visit (from the past 240 hour(s)).       Radiology Studies: No results found.  Scheduled Meds: . apixaban  5 mg Oral BID  . bisoprolol  5 mg Oral QHS  . fenofibrate  160 mg Oral Daily  . furosemide  20 mg Intravenous BID  . latanoprost  1 drop Left Eye QHS  . loratadine  10 mg Oral Daily  . potassium chloride  10 mEq Oral Daily  . pravastatin  40 mg Oral QHS  . sodium chloride flush  3 mL Intravenous Q12H  . tamsulosin  0.4 mg Oral QPM   Continuous Infusions: . sodium chloride       LOS: 2 days    Time spent: 20 min  Velvet Bathe, MD [facility] Pager [phone]  If 7PM-7AM, please contact night-coverage [email or web address] Password [email or web address] 06/13/2017, 9:01 AM

## 2017-06-14 DIAGNOSIS — I509 Heart failure, unspecified: Secondary | ICD-10-CM

## 2017-06-14 LAB — BASIC METABOLIC PANEL
ANION GAP: 8 (ref 5–15)
BUN: 35 mg/dL — ABNORMAL HIGH (ref 6–20)
CALCIUM: 9.1 mg/dL (ref 8.9–10.3)
CO2: 27 mmol/L (ref 22–32)
CREATININE: 1.31 mg/dL — AB (ref 0.61–1.24)
Chloride: 103 mmol/L (ref 101–111)
GFR, EST AFRICAN AMERICAN: 54 mL/min — AB (ref >60)
GFR, EST NON AFRICAN AMERICAN: 47 mL/min — AB (ref >60)
Glucose, Bld: 114 mg/dL — ABNORMAL HIGH (ref 65–99)
Potassium: 3.9 mmol/L (ref 3.5–5.1)
SODIUM: 138 mmol/L (ref 135–145)

## 2017-06-14 MED ORDER — SODIUM CHLORIDE 0.9% FLUSH
3.0000 mL | Freq: Two times a day (BID) | INTRAVENOUS | Status: DC
  Administered 2017-06-14: 3 mL via INTRAVENOUS

## 2017-06-14 MED ORDER — SODIUM CHLORIDE 0.9 % IV SOLN: 250.0000 mL | INTRAVENOUS | Status: DC

## 2017-06-14 MED ORDER — SODIUM CHLORIDE 0.9% FLUSH: 3.0000 mL | INTRAVENOUS | Status: DC | PRN

## 2017-06-14 MED ORDER — SODIUM CHLORIDE 0.9% FLUSH: 3.0000 mL | Freq: Two times a day (BID) | INTRAVENOUS | Status: DC

## 2017-06-14 MED ORDER — HYDROCORTISONE 1 % EX CREA
1.0000 "application " | TOPICAL_CREAM | Freq: Three times a day (TID) | CUTANEOUS | Status: DC | PRN
  Administered 2017-06-16: 1 via TOPICAL
  Filled 2017-06-14: qty 28

## 2017-06-14 NOTE — Progress Notes (Signed)
PROGRESS NOTE    Donald [name]  BDZ:[id number] DOB: 1926/04/09 DOA: 06/10/2017 PCP: Haywood Pao, MD   Brief Narrative:  84 y.o. male with medical history significant of CAD, ischemic cardiomyopathy, atrial fibrillation on Eliquis followed by Dr. [name], prostate cancer s/p APC; who presented with complaints of progressively worsening shortness of breath over the last 2 weeks  Pt noticed to have elevated HR's at home.   Assessment & Plan:   Principal Problem:   New onset of congestive heart failure ([place]) - Plan is to continue to diurese prior to cardioversion which is scheduled for 06/15/2017. Cardiology consulted and managing. Plan is to continue present dose of lasix.  Active Problems:   AF (paroxysmal atrial fibrillation) (H[facility]) - Stable controlled with bisoprolol, cardioversion scheduled for date listed above. Patient to be continued on eliquis for anticoagulation    AKI (acute kidney injury) ([place]) - Will avoid nephrotoxic agents ARB/ACEI - reassess serum creatinine next am    Normocytic normochromic anemia - hgb stable no active bleeding.  DVT prophylaxis: pt on eliquis Code Status: DNR Family Communication: Discussed with patient and family member at bedside Disposition Plan: Pending final recommendations by cardiologist   Consultants:   Cardiology   Procedures: None   Antimicrobials: None   Subjective: No new complaints reported today. No acute issues overnight.  Objective: Vitals:   06/14/17 0404 06/14/17 0646 06/14/17 0802 06/14/17 1208  BP: (!) 87/62 (!) 89/62 94/67 105/63  Pulse: 87   92  Resp: 20 18  18   Temp: 97.8 F (36.6 C) (!) 97.5 F (36.4 C)  (!) 97.5 F (36.4 C)  TempSrc: Oral Oral  Oral  SpO2: 96% 95%  97%  Weight: 85.1 kg (187 lb 11.2 oz)     Height:        Intake/Output Summary (Last 24 hours) at 06/14/2017 1428 Last data filed at 06/14/2017 1253 Gross per 24 hour  Intake 1080 ml  Output 500 ml  Net 580 ml   Filed  Weights   06/12/17 0700 06/13/17 0429 06/14/17 0404  Weight: 86.4 kg (190 lb 8 oz) 86.5 kg (190 lb 9.6 oz) 85.1 kg (187 lb 11.2 oz)    Examination: Exam unchanged when compared to prior on 06/13/17  General exam: Appears calm and comfortable, no acute distress  Respiratory system: Clear to auscultation. Respiratory effort normal. Cardiovascular system: S1 & S2 heard, IRRR. No JVD, murmurs, rubs Gastrointestinal system: Abdomen is nondistended, soft and nontender. No organomegaly or masses felt. Normal bowel sounds heard. Central nervous system: Alert and oriented. No focal neurological deficits. Extremities: Symmetric 5 x 5 power. Skin: No rashes, lesions or ulcers, on limited exam Psychiatry: Mood & affect appropriate.   Data Reviewed: I have personally reviewed following labs and imaging studies  CBC: Recent Labs  Lab 06/08/17 1506 06/10/17 2114 06/11/17 0606  WBC 4.5 5.9 5.1  NEUTROABS  --   --  2.8  HGB 12.6* 12.1* 11.6*  HCT 39.5 37.9* 36.5*  MCV 97.8 95.5 96.3  PLT 214 210 341   Basic Metabolic Panel: Recent Labs  Lab 06/10/17 2114 06/11/17 0606 06/11/17 1301 06/12/17 0502 06/13/17 0717 06/14/17 0406  NA 139  --  139 140 139 138  K 4.4  --  4.1 3.9 4.1 3.9  CL 107  --  106 107 105 103  CO2 22  --  22 25 27 27   GLUCOSE 137*  --  95 116* 118* 114*  BUN 32*  --  30*  32* 37* 35*  CREATININE 1.29*  --  1.22 1.35* 1.25* 1.31*  CALCIUM 9.5  --  9.4 9.0 9.3 9.1  MG  --  1.7  --   --   --   --    GFR: Estimated Creatinine Clearance: 42.2 mL/min (A) (by C-G formula based on SCr of 1.31 mg/dL (H)). Liver Function Tests: Recent Labs  Lab 06/08/17 1506  AST 24  ALT 16*  ALKPHOS 34*  BILITOT 0.7  PROT 6.0*  ALBUMIN 3.5   Recent Labs  Lab 06/10/17 2227  LIPASE 33   No results for input(s): AMMONIA in the last 168 hours. Coagulation Profile: No results for input(s): INR, PROTIME in the last 168 hours. Cardiac Enzymes: Recent Labs  Lab 06/11/17 0207  06/11/17 0606 06/11/17 1301  TROPONINI <0.03 0.03* 0.03*   BNP (last 3 results) No results for input(s): PROBNP in the last 8760 hours. HbA1C: No results for input(s): HGBA1C in the last 72 hours. CBG: No results for input(s): GLUCAP in the last 168 hours. Lipid Profile: No results for input(s): CHOL, HDL, LDLCALC, TRIG, CHOLHDL, LDLDIRECT in the last 72 hours. Thyroid Function Tests: No results for input(s): TSH, T4TOTAL, FREET4, T3FREE, THYROIDAB in the last 72 hours. Anemia Panel: No results for input(s): VITAMINB12, FOLATE, FERRITIN, TIBC, IRON, RETICCTPCT in the last 72 hours. Sepsis Labs: No results for input(s): PROCALCITON, LATICACIDVEN in the last 168 hours.  No results found for this or any previous visit (from the past 240 hour(s)).       Radiology Studies: No results found.  Scheduled Meds: . apixaban  5 mg Oral BID  . bisoprolol  5 mg Oral QHS  . fenofibrate  160 mg Oral Daily  . furosemide  40 mg Intravenous BID  . latanoprost  1 drop Left Eye QHS  . loratadine  10 mg Oral Daily  . potassium chloride  10 mEq Oral Daily  . pravastatin  40 mg Oral QHS  . sodium chloride flush  3 mL Intravenous Q12H  . sodium chloride flush  3 mL Intravenous Q12H  . sodium chloride flush  3 mL Intravenous Q12H  . tamsulosin  0.4 mg Oral QPM   Continuous Infusions: . sodium chloride    . sodium chloride    . [START ON 06/15/2017] sodium chloride       LOS: 3 days    Time spent: 20 min  Velvet Bathe, MD [facility] Pager [phone]  If 7PM-7AM, please contact night-coverage [email or web address] Password [facility] 06/14/2017, 2:28 PM

## 2017-06-14 NOTE — Progress Notes (Signed)
Pt needs MD signature on DNR form. MD notified.

## 2017-06-14 NOTE — Progress Notes (Signed)
Progress Note  Patient Name: Donald [name] Date of Encounter: 06/14/2017  Primary Cardiologist: Dr [name]  Subjective   No chest pain; mild DOE.  Inpatient Medications    Scheduled Meds: . apixaban  5 mg Oral BID  . bisoprolol  5 mg Oral QHS  . fenofibrate  160 mg Oral Daily  . furosemide  40 mg Intravenous BID  . latanoprost  1 drop Left Eye QHS  . loratadine  10 mg Oral Daily  . potassium chloride  10 mEq Oral Daily  . pravastatin  40 mg Oral QHS  . sodium chloride flush  3 mL Intravenous Q12H  . tamsulosin  0.4 mg Oral QPM   Continuous Infusions: . sodium chloride     PRN Meds: sodium chloride, acetaminophen, fluticasone furoate-vilanterol, ipratropium-albuterol, ondansetron (ZOFRAN) IV, sodium chloride flush   Vital Signs    Vitals:   06/14/17 0030 06/14/17 0404 06/14/17 0646 06/14/17 0802  BP: 92/62 (!) 87/62 (!) 89/62 94/67  Pulse: 76 87    Resp: 20 20 18    Temp: 97.6 F (36.4 C) 97.8 F (36.6 C) (!) 97.5 F (36.4 C)   TempSrc: Oral Oral Oral   SpO2: 96% 96% 95%   Weight:  187 lb 11.2 oz (85.1 kg)    Height:        Intake/Output Summary (Last 24 hours) at 06/14/2017 1044 Last data filed at 06/14/2017 0847 Gross per 24 hour  Intake 720 ml  Output -  Net 720 ml   Filed Weights   06/12/17 0700 06/13/17 0429 06/14/17 0404  Weight: 190 lb 8 oz (86.4 kg) 190 lb 9.6 oz (86.5 kg) 187 lb 11.2 oz (85.1 kg)    Telemetry    Atrial fibrillation with PVCs or aberrantly conducted beats - Personally Reviewed   Physical Exam   GEN: No acute distress.   Neck: No JVD Cardiac: irregular Respiratory: Clear to auscultation bilaterally. GI: Soft, nontender, non-distended  MS: No edema Neuro:  Nonfocal  Psych: Normal affect   Labs    Chemistry Recent Labs  Lab 06/08/17 1506  06/12/17 0502 06/13/17 0717 06/14/17 0406  NA 142   < > 140 139 138  K 4.1   < > 3.9 4.1 3.9  CL 110   < > 107 105 103  CO2 25   < > 25 27 27   GLUCOSE 91   < > 116* 118*  114*  BUN 17   < > 32* 37* 35*  CREATININE 0.84   < > 1.35* 1.25* 1.31*  CALCIUM 9.1   < > 9.0 9.3 9.1  PROT 6.0*  --   --   --   --   ALBUMIN 3.5  --   --   --   --   AST 24  --   --   --   --   ALT 16*  --   --   --   --   ALKPHOS 34*  --   --   --   --   BILITOT 0.7  --   --   --   --   GFRNONAA >60   < > 45* 50* 47*  GFRAA >60   < > 52* 57* 54*  ANIONGAP 7   < > 8 7 8    < > = values in this interval not displayed.     Hematology Recent Labs  Lab 06/08/17 1506 06/10/17 2114 06/11/17 0606  WBC 4.5 5.9 5.1  RBC 4.04*  3.97* 3.79*  HGB 12.6* 12.1* 11.6*  HCT 39.5 37.9* 36.5*  MCV 97.8 95.5 96.3  MCH 31.2 30.5 30.6  MCHC 31.9 31.9 31.8  RDW 14.4 14.2 14.3  PLT 214 210 196    Cardiac Enzymes Recent Labs  Lab 06/11/17 0207 06/11/17 0606 06/11/17 1301  TROPONINI <0.03 0.03* 0.03*    Recent Labs  Lab 06/10/17 2128  TROPIPOC 0.01     BNP Recent Labs  Lab 06/10/17 2217  BNP 930.7*     Patient Profile     Donald [name] is a 84 y.o. 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     Assessment & Plan    1 acute combined systolic/diastolic congestive heart failure-symptoms are improving.  Continue present dose of Lasix and follow renal function.  Atrial fibrillation likely with significant contribution heart failure symptoms.  2 persistent atrial fibrillation-this is likely causing his congestive heart failure.  Continue beta-blocker for rate control.  Continue apixaban at present dose.  Plan for cardioversion tomorrow.  If he does not hold sinus rhythm would likely treat with amiodarone long-term.  3 mitral regurgitation-moderate to severe mitral regurgitation noted on echocardiogram.  This is also a new finding.  Given age  would like to be conservative if possible.  He has also had previous coronary artery bypass and graft.  We will plan repeat echocardiogram once sinus reestablished.  If we feel mitral regurgitation is contributing to congestive heart failure in the future we could consider referral for mitral clip.  4 acute kidney disease-likely secondary to Lasix.  Follow closely with diuresis.  5 coronary artery disease status post coronary artery bypass and graft-continue statin.  No aspirin given need for anticoagulation.  For questions or updates, please contact [place] Please consult [email or web address] for contact info under Cardiology/STEMI.      Signed, Kirk Ruths, MD  06/14/2017, 10:44 AM

## 2017-06-15 ENCOUNTER — Other Ambulatory Visit: Payer: Self-pay

## 2017-06-15 ENCOUNTER — Inpatient Hospital Stay (HOSPITAL_COMMUNITY): Payer: Medicare Other | Admitting: Certified Registered Nurse Anesthetist

## 2017-06-15 ENCOUNTER — Ambulatory Visit (HOSPITAL_COMMUNITY): Admission: RE | Admit: 2017-06-15 | Payer: Medicare Other | Source: Ambulatory Visit | Admitting: Cardiovascular Disease

## 2017-06-15 ENCOUNTER — Encounter (HOSPITAL_COMMUNITY): Payer: Self-pay

## 2017-06-15 ENCOUNTER — Encounter (HOSPITAL_COMMUNITY)
Admission: EM | Disposition: A | Discharge status: Home Health | Payer: Self-pay | Source: Home / Self Care | Attending: Family Medicine

## 2017-06-15 HISTORY — PX: CARDIOVERSION: SHX1299

## 2017-06-15 LAB — BASIC METABOLIC PANEL
Anion gap: 12 (ref 5–15)
Anion gap: 10 (ref 5–15)
BUN: 35 mg/dL — ABNORMAL HIGH (ref 6–20)
BUN: 34 mg/dL — AB (ref 6–20)
CALCIUM: 9.2 mg/dL (ref 8.9–10.3)
CHLORIDE: 99 mmol/L — AB (ref 101–111)
CO2: 28 mmol/L (ref 22–32)
CO2: 27 mmol/L (ref 22–32)
CREATININE: 1.25 mg/dL — AB (ref 0.61–1.24)
CREATININE: 1.26 mg/dL — AB (ref 0.61–1.24)
Calcium: 9.2 mg/dL (ref 8.9–10.3)
Chloride: 102 mmol/L (ref 101–111)
GFR calc Af Amer: 57 mL/min — ABNORMAL LOW (ref >60)
GFR calc non Af Amer: 50 mL/min — ABNORMAL LOW (ref >60)
GFR, EST AFRICAN AMERICAN: 57 mL/min — AB (ref >60)
GFR, EST NON AFRICAN AMERICAN: 49 mL/min — AB (ref >60)
GLUCOSE: 116 mg/dL — AB (ref 65–99)
Glucose, Bld: 107 mg/dL — ABNORMAL HIGH (ref 65–99)
POTASSIUM: 3.7 mmol/L (ref 3.5–5.1)
POTASSIUM: 4.1 mmol/L (ref 3.5–5.1)
SODIUM: 139 mmol/L (ref 135–145)
SODIUM: 139 mmol/L (ref 135–145)

## 2017-06-15 LAB — MAGNESIUM: MAGNESIUM: 1.9 mg/dL (ref 1.7–2.4)

## 2017-06-15 LAB — CBC WITH DIFFERENTIAL/PLATELET
BASOS ABS: 0 10*3/uL (ref 0.0–0.1)
Basophils Relative: 0 %
EOS ABS: 0.2 10*3/uL (ref 0.0–0.7)
EOS PCT: 3 %
HCT: 37.4 % — ABNORMAL LOW (ref 39.0–52.0)
Hemoglobin: 12.2 g/dL — ABNORMAL LOW (ref 13.0–17.0)
LYMPHS ABS: 1.3 10*3/uL (ref 0.7–4.0)
LYMPHS PCT: 27 %
MCH: 31.4 pg (ref 26.0–34.0)
MCHC: 32.6 g/dL (ref 30.0–36.0)
MCV: 96.4 fL (ref 78.0–100.0)
Monocytes Absolute: 0.3 10*3/uL (ref 0.1–1.0)
Monocytes Relative: 6 %
NEUTROS PCT: 64 %
Neutro Abs: 2.9 10*3/uL (ref 1.7–7.7)
PLATELETS: 201 10*3/uL (ref 150–400)
RBC: 3.88 MIL/uL — AB (ref 4.22–5.81)
RDW: 14.5 % (ref 11.5–15.5)
WBC: 4.7 10*3/uL (ref 4.0–10.5)

## 2017-06-15 LAB — PROTIME-INR
INR: 1.59
PROTHROMBIN TIME: 18.8 s — AB (ref 11.4–15.2)

## 2017-06-15 LAB — APTT: APTT: 35 s (ref 24–36)

## 2017-06-15 SURGERY — CARDIOVERSION
Anesthesia: General

## 2017-06-15 MED ORDER — PROPOFOL 10 MG/ML IV BOLUS
INTRAVENOUS | Status: DC | PRN
  Administered 2017-06-15: 50 mg via INTRAVENOUS

## 2017-06-15 MED ORDER — LIDOCAINE 2% (20 MG/ML) 5 ML SYRINGE
INTRAMUSCULAR | Status: DC | PRN
  Administered 2017-06-15: 60 mg via INTRAVENOUS

## 2017-06-15 MED ORDER — PHENYLEPHRINE HCL 10 MG/ML IJ SOLN
INTRAMUSCULAR | Status: DC | PRN
  Administered 2017-06-15: 80 ug via INTRAVENOUS
  Administered 2017-06-15: 40 ug via INTRAVENOUS

## 2017-06-15 MED ORDER — EPHEDRINE SULFATE 50 MG/ML IJ SOLN
INTRAMUSCULAR | Status: DC | PRN
  Administered 2017-06-15: 10 mg via INTRAVENOUS
  Administered 2017-06-15: 5 mg via INTRAVENOUS

## 2017-06-15 NOTE — Care Management Important Message (Signed)
Important Message  Patient Details  Name: Donald [name] MRN: [id number] Date of Birth: [birth year]   Medicare Important Message Given:  Yes    [name] 06/15/2017, 3:24 PM

## 2017-06-15 NOTE — H&P (View-Only) (Signed)
Progress Note  Patient Name: Donald [name] Date of Encounter: 06/15/2017  Primary Cardiologist: Dr [name]  Subjective   Still with mild DOE; no chest pain  Inpatient Medications    Scheduled Meds: . apixaban  5 mg Oral BID  . bisoprolol  5 mg Oral QHS  . fenofibrate  160 mg Oral Daily  . furosemide  40 mg Intravenous BID  . latanoprost  1 drop Left Eye QHS  . loratadine  10 mg Oral Daily  . potassium chloride  10 mEq Oral Daily  . pravastatin  40 mg Oral QHS  . sodium chloride flush  3 mL Intravenous Q12H  . sodium chloride flush  3 mL Intravenous Q12H  . sodium chloride flush  3 mL Intravenous Q12H  . tamsulosin  0.4 mg Oral QPM   Continuous Infusions: . sodium chloride    . sodium chloride    . sodium chloride     PRN Meds: sodium chloride, acetaminophen, fluticasone furoate-vilanterol, hydrocortisone cream, ipratropium-albuterol, ondansetron (ZOFRAN) IV, sodium chloride flush, sodium chloride flush, sodium chloride flush   Vital Signs    Vitals:   06/14/17 2100 06/15/17 0423 06/15/17 0816 06/15/17 1138  BP: 102/68 90/68 (!) 80/58 91/61  Pulse: (!) 52 88 80 81  Resp: 20 20 20 20   Temp: 97.7 F (36.5 C) 97.7 F (36.5 C) 97.7 F (36.5 C) (!) 97.5 F (36.4 C)  TempSrc: Oral Oral Oral Oral  SpO2: 92% 96% 96% 96%  Weight:  185 lb 9.6 oz (84.2 kg)    Height:        Intake/Output Summary (Last 24 hours) at 06/15/2017 1226 Last data filed at 06/15/2017 1145 Gross per 24 hour  Intake 360 ml  Output 2550 ml  Net -2190 ml   Filed Weights   06/13/17 0429 06/14/17 0404 06/15/17 0423  Weight: 190 lb 9.6 oz (86.5 kg) 187 lb 11.2 oz (85.1 kg) 185 lb 9.6 oz (84.2 kg)    Telemetry    Atrial fibrillation rate controlled - Personally Reviewed   Physical Exam   GEN: WD/WN No acute distress.   Neck: supple Cardiac: irregular, 2/6 systolic murmur Respiratory: Clear to auscultation bilaterally; no wheeze GI: Soft, nontender, non-distended, no masses MS: No  edema Neuro:  Grossly intact   Labs    Chemistry Recent Labs  Lab 06/08/17 1506  06/14/17 0406 06/15/17 0448 06/15/17 0850  NA 142   < > 138 139 139  K 4.1   < > 3.9 3.7 4.1  CL 110   < > 103 99* 102  CO2 25   < > 27 28 27   GLUCOSE 91   < > 114* 107* 116*  BUN 17   < > 35* 35* 34*  CREATININE 0.84   < > 1.31* 1.25* 1.26*  CALCIUM 9.1   < > 9.1 9.2 9.2  PROT 6.0*  --   --   --   --   ALBUMIN 3.5  --   --   --   --   AST 24  --   --   --   --   ALT 16*  --   --   --   --   ALKPHOS 34*  --   --   --   --   BILITOT 0.7  --   --   --   --   GFRNONAA >60   < > 47* 50* 49*  GFRAA >60   < > 54* 57*  57*  ANIONGAP 7   < > 8 12 10    < > = values in this interval not displayed.     Hematology Recent Labs  Lab 06/10/17 2114 06/11/17 0606 06/15/17 0448  WBC 5.9 5.1 4.7  RBC 3.97* 3.79* 3.88*  HGB 12.1* 11.6* 12.2*  HCT 37.9* 36.5* 37.4*  MCV 95.5 96.3 96.4  MCH 30.5 30.6 31.4  MCHC 31.9 31.8 32.6  RDW 14.2 14.3 14.5  PLT 210 196 201    Cardiac Enzymes Recent Labs  Lab 06/11/17 0207 06/11/17 0606 06/11/17 1301  TROPONINI <0.03 0.03* 0.03*    Recent Labs  Lab 06/10/17 2128  TROPIPOC 0.01     BNP Recent Labs  Lab 06/10/17 2217  BNP 930.7*     Patient Profile     Donald [name] is a 84 y.o. 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     Assessment & Plan    1 acute combined systolic/diastolic congestive heart failure-symptoms continue to improve.  Continue present dose of Lasix and follow renal function.  Atrial fibrillation likely with significant contribution heart failure symptoms.  2 persistent atrial fibrillation-this is likely contributing to his congestive heart failure.  Continue beta-blocker for rate control.   Continue apixaban at present dose.  Plan for cardioversion today.  If he does not hold sinus rhythm would likely treat with amiodarone long-term.  3 mitral regurgitation-moderate to severe mitral regurgitation noted on echocardiogram.  This is also a new finding.  Given age and prior CABG (and thus need for redo) would like to be conservative if possible.  We will plan repeat echocardiogram once sinus reestablished.  If we feel mitral regurgitation is contributing to congestive heart failure in the future we could consider referral for mitral clip.  4 acute kidney disease-renal function unchanged today; continue to follow.  5 coronary artery disease status post coronary artery bypass and graft-continue statin.  No aspirin given need for anticoagulation.  For questions or updates, please contact [place] Please consult [email or web address] for contact info under Cardiology/STEMI.      Signed, Kirk Ruths, MD  06/15/2017, 12:26 PM

## 2017-06-15 NOTE — Anesthesia Preprocedure Evaluation (Signed)
Anesthesia Evaluation  Patient identified by MRN, date of birth, ID band Patient awake    Reviewed: Allergy & Precautions, H&P , NPO status , Patient's Chart, lab work & pertinent test results  Airway Mallampati: III  TM Distance: >3 FB Neck ROM: Full    Dental no notable dental hx. (+) Teeth Intact, Dental Advisory Given   Pulmonary asthma ,    Pulmonary exam normal breath sounds clear to auscultation       Cardiovascular + CAD, + CABG and +CHF  + dysrhythmias Atrial Fibrillation  Rhythm:Irregular Rate:Normal     Neuro/Psych negative neurological ROS  negative psych ROS   GI/Hepatic negative GI ROS, Neg liver ROS,   Endo/Other  negative endocrine ROS  Renal/GU Renal InsufficiencyRenal disease  negative genitourinary   Musculoskeletal  (+) Arthritis , Osteoarthritis,    Abdominal   Peds  Hematology negative hematology ROS (+) anemia ,   Anesthesia Other Findings   Reproductive/Obstetrics negative OB ROS                             Anesthesia Physical Anesthesia Plan  ASA: III  Anesthesia Plan: General   Post-op Pain Management:    Induction: Intravenous  PONV Risk Score and Plan: 2 and Treatment may vary due to age or medical condition  Airway Management Planned: Mask  Additional Equipment:   Intra-op Plan:   Post-operative Plan:   Informed Consent: I have reviewed the patients History and Physical, chart, labs and discussed the procedure including the risks, benefits and alternatives for the proposed anesthesia with the patient or authorized representative who has indicated his/her understanding and acceptance.   Dental advisory given  Plan Discussed with: CRNA  Anesthesia Plan Comments:         Anesthesia Quick Evaluation

## 2017-06-15 NOTE — Interval H&P Note (Signed)
History and Physical Interval Note:  06/15/2017 12:43 PM  Donald [name]  has presented today for surgery, with the diagnosis of A-FIB  The various methods of treatment have been discussed with the patient and family. After consideration of risks, benefits and other options for treatment, the patient has consented to  Procedure(s): CARDIOVERSION (N/A) as a surgical intervention .  The patient's history has been reviewed, patient examined, no change in status, stable for surgery.  I have reviewed the patient's chart and labs.  Questions were answered to the patient's satisfaction.     Mertie Moores

## 2017-06-15 NOTE — Anesthesia Postprocedure Evaluation (Signed)
Anesthesia Post Note  Patient: Donald [name]  Procedure(s) Performed: CARDIOVERSION (N/A )     Patient location during evaluation: PACU Anesthesia Type: General Level of consciousness: awake and alert Pain management: pain level controlled Vital Signs Assessment: post-procedure vital signs reviewed and stable Respiratory status: spontaneous breathing, nonlabored ventilation and respiratory function stable Cardiovascular status: blood pressure returned to baseline and stable Postop Assessment: no apparent nausea or vomiting Anesthetic complications: no    Last Vitals:  Vitals:   06/15/17 1335 06/15/17 1340  BP: 110/78   Pulse: 67 64  Resp: 17 19  Temp:    SpO2: 96% 97%    Last Pain:  Vitals:   06/15/17 1310  TempSrc: Oral  PainSc:                  [name],W. EDMOND

## 2017-06-15 NOTE — Progress Notes (Signed)
PROGRESS NOTE    Donald [name]  ACZ:[id number] DOB: April 22, 1926 DOA: 06/10/2017 PCP: Haywood Pao, MD   Brief Narrative:  84 y.o. male with medical history significant of CAD, ischemic cardiomyopathy, atrial fibrillation on Eliquis followed by Dr. [name], prostate cancer s/p APC; who presented with complaints of progressively worsening shortness of breath over the last 2 weeks  Pt noticed to have elevated HR's at home. While in house Cardiology decided to [place] patient then cardiovert today 06/15/17  Assessment & Plan:   Principal Problem:   New onset of congestive heart failure ([employer]) - Pt was diuresed prior to cardioversion 06/15/2017. Cardiology consulted and managing. Further management per cardiology currently.  Active Problems:   AF (paroxysmal atrial fibrillation) (HCC) - Cardioversion today - Continue Eliquis for anticoagulation     AKI (acute kidney injury) ([place]) - Will avoid nephrotoxic agents ARB/ACEI - improving and near baseline    Normocytic normochromic anemia - hgb stable no active bleeding.  DVT prophylaxis: pt on eliquis Code Status: DNR Family Communication: Discussed with patient and suspected spouse at bedside Disposition Plan: Pending final recommendations by cardiologist   Consultants:   Cardiology   Procedures: None   Antimicrobials: None   Subjective: No new complaints reported today. No acute issues overnight.  Objective: Vitals:   06/15/17 1330 06/15/17 1335 06/15/17 1340 06/15/17 1359  BP:  110/78  99/67  Pulse: 64 67 64 63  Resp: 18 17 19    Temp:      TempSrc:      SpO2: 97% 96% 97%   Weight:      Height:        Intake/Output Summary (Last 24 hours) at 06/15/2017 1724 Last data filed at 06/15/2017 1401 Gross per 24 hour  Intake 590 ml  Output 2050 ml  Net -1460 ml   Filed Weights   06/14/17 0404 06/15/17 0423 06/15/17 1234  Weight: 85.1 kg (187 lb 11.2 oz) 84.2 kg (185 lb 9.6 oz) 83.9 kg (185 lb)     Examination: Exam unchanged when compared to prior on 06/14/17  General exam: Appears calm and comfortable, no acute distress  Respiratory system: Clear to auscultation. Respiratory effort normal. Cardiovascular system: S1 & S2 heard, IRRR. No JVD, murmurs, rubs Gastrointestinal system: Abdomen is nondistended, soft and nontender. No organomegaly or masses felt. Normal bowel sounds heard. Central nervous system: Alert and oriented. No focal neurological deficits. Extremities: Symmetric 5 x 5 power. Skin: No rashes, lesions or ulcers, on limited exam Psychiatry: Mood & affect appropriate.   Data Reviewed: I have personally reviewed following labs and imaging studies  CBC: Recent Labs  Lab 06/10/17 2114 06/11/17 0606 06/15/17 0448  WBC 5.9 5.1 4.7  NEUTROABS  --  2.8 2.9  HGB 12.1* 11.6* 12.2*  HCT 37.9* 36.5* 37.4*  MCV 95.5 96.3 96.4  PLT 210 196 109   Basic Metabolic Panel: Recent Labs  Lab 06/11/17 0606  06/12/17 0502 06/13/17 0717 06/14/17 0406 06/15/17 0448 06/15/17 0850  NA  --    < > 140 139 138 139 139  K  --    < > 3.9 4.1 3.9 3.7 4.1  CL  --    < > 107 105 103 99* 102  CO2  --    < > 25 27 27 28 27   GLUCOSE  --    < > 116* 118* 114* 107* 116*  BUN  --    < > 32* 37* 35* 35* 34*  CREATININE  --    < >  1.35* 1.25* 1.31* 1.25* 1.26*  CALCIUM  --    < > 9.0 9.3 9.1 9.2 9.2  MG 1.7  --   --   --   --  1.9  --    < > = values in this interval not displayed.   GFR: Estimated Creatinine Clearance: 40.5 mL/min (A) (by C-G formula based on SCr of 1.26 mg/dL (H)). Liver Function Tests: No results for input(s): AST, ALT, ALKPHOS, BILITOT, PROT, ALBUMIN in the last 168 hours. Recent Labs  Lab 06/10/17 2227  LIPASE 33   No results for input(s): AMMONIA in the last 168 hours. Coagulation Profile: Recent Labs  Lab 06/15/17 0448  INR 1.59   Cardiac Enzymes: Recent Labs  Lab 06/11/17 0207 06/11/17 0606 06/11/17 1301  TROPONINI <0.03 0.03* 0.03*   BNP  (last 3 results) No results for input(s): PROBNP in the last 8760 hours. HbA1C: No results for input(s): HGBA1C in the last 72 hours. CBG: No results for input(s): GLUCAP in the last 168 hours. Lipid Profile: No results for input(s): CHOL, HDL, LDLCALC, TRIG, CHOLHDL, LDLDIRECT in the last 72 hours. Thyroid Function Tests: No results for input(s): TSH, T4TOTAL, FREET4, T3FREE, THYROIDAB in the last 72 hours. Anemia Panel: No results for input(s): VITAMINB12, FOLATE, FERRITIN, TIBC, IRON, RETICCTPCT in the last 72 hours. Sepsis Labs: No results for input(s): PROCALCITON, LATICACIDVEN in the last 168 hours.  No results found for this or any previous visit (from the past 240 hour(s)).       Radiology Studies: No results found.  Scheduled Meds: . apixaban  5 mg Oral BID  . bisoprolol  5 mg Oral QHS  . fenofibrate  160 mg Oral Daily  . furosemide  40 mg Intravenous BID  . latanoprost  1 drop Left Eye QHS  . loratadine  10 mg Oral Daily  . potassium chloride  10 mEq Oral Daily  . pravastatin  40 mg Oral QHS  . sodium chloride flush  3 mL Intravenous Q12H  . sodium chloride flush  3 mL Intravenous Q12H  . sodium chloride flush  3 mL Intravenous Q12H  . tamsulosin  0.4 mg Oral QPM   Continuous Infusions: . sodium chloride Stopped (06/15/17 1346)  . sodium chloride    . sodium chloride       LOS: 4 days    Time spent: 20 min  Velvet Bathe, MD [facility] Pager [phone]  If 7PM-7AM, please contact night-coverage [email or web address] Password [facility] 06/15/2017, 5:24 PM

## 2017-06-15 NOTE — Transfer of Care (Signed)
Immediate Anesthesia Transfer of Care Note  Patient: Donald [name]  Procedure(s) Performed: CARDIOVERSION (N/A )  Patient Location: Endoscopy Unit  Anesthesia Type:General  Level of Consciousness: drowsy and patient cooperative  Airway & Oxygen Therapy: Patient Spontanous Breathing  Post-op Assessment: Report given to RN, Post -op Vital signs reviewed and stable and Patient moving all extremities X 4  Post vital signs: Reviewed and stable  Last Vitals:  Vitals:   06/15/17 1138 06/15/17 1234  BP: 91/61 97/65  Pulse: 81 73  Resp: 20 19  Temp: (!) 36.4 C 36.6 C  SpO2: 96% 99%    Last Pain:  Vitals:   06/15/17 1234  TempSrc: Oral  PainSc:          Complications: No apparent anesthesia complications

## 2017-06-15 NOTE — Progress Notes (Signed)
Physical Therapy Treatment Patient Details Name: Donald [name] MRN: [id number] DOB: 1927/02/19 Today's Date: 06/15/2017    History of Present Illness 84 yo male with onset of new CHF dx was admitted, has SOB, AKI, A-fib, on 2L O2, with PMHx:  CVA, CAD, asthma, ischemic cardiac, GI bleed, a-fib, prostate CA, tubular adenoma,    PT Comments    Pt standing in room on arrival finishing brushing his teeth.  Pt with stable vitals during gait training but still presents with signs of fatigue during session.  Plan remains appropriate to return home and he reports his daughter is able to check in on him.  Plan next session for higher level balance and standing exercises to improve activity.  Pt reports he feels weak from procedure and not eating.  Pt  Should be able to tolerate more activity next session.    Follow Up Recommendations  Home health PT;Supervision - Intermittent     Equipment Recommendations  None recommended by PT    Recommendations for Other Services       Precautions / Restrictions Precautions Precautions: Fall(telemetry) Restrictions Weight Bearing Restrictions: No    Mobility  Bed Mobility                  Transfers Overall transfer level: Modified independent               General transfer comment: Pt standing in room on arrival and returned to sit edge of bed post session.    Ambulation/Gait Ambulation/Gait assistance: Min guard Ambulation Distance (Feet): 240 Feet(x1 standing break) Assistive device: None Gait Pattern/deviations: Step-through pattern;WFL(Within Functional Limits)     General Gait Details: Pt only required cues for pacing and rest break.  HR stable post cardioversion at 74bpm, SPO2 on RA greater than 90%.     Stairs            Wheelchair Mobility    Modified Rankin (Stroke Patients Only)       Balance Overall balance assessment: No apparent balance deficits (not formally assessed)                                           Cognition Arousal/Alertness: Awake/alert Behavior During Therapy: WFL for tasks assessed/performed Overall Cognitive Status: Within Functional Limits for tasks assessed                                        Exercises      General Comments        Pertinent Vitals/Pain Pain Assessment: No/denies pain    Home Living                      Prior Function            PT Goals (current goals can now be found in the care plan section) Acute Rehab PT Goals Patient Stated Goal: to get stronger and see his doctor from home Potential to Achieve Goals: Good Progress towards PT goals: Progressing toward goals    Frequency           PT Plan Current plan remains appropriate    Co-evaluation              AM-PAC PT "6 Clicks" Daily Activity  Outcome Measure  Difficulty turning over in bed (including adjusting bedclothes, sheets and blankets)?: None Difficulty moving from lying on back to sitting on the side of the bed? : None Difficulty sitting down on and standing up from a chair with arms (e.g., wheelchair, bedside commode, etc,.)?: None Help needed moving to and from a bed to chair (including a wheelchair)?: None Help needed walking in hospital room?: A Little Help needed climbing 3-5 steps with a railing? : A Little 6 Click Score: 22    End of Session Equipment Utilized During Treatment: Gait belt Activity Tolerance: Treatment limited secondary to medical complications (Comment) Patient left: in bed;with call bell/phone within reach Nurse Communication: Mobility status PT Visit Diagnosis: Other (comment)(cardiac symptoms on afib and O2 desaturation)     Time: 1587-2761 PT Time Calculation (min) (ACUTE ONLY): 8 min  Charges:  $Gait Training: 8-22 mins                    G CodesGovernor Rooks, PTA pager [place] 06/15/2017, 3:04 PM

## 2017-06-15 NOTE — Progress Notes (Signed)
Pt back from endo. Pt states he does not need anything at this time. Pt states he is going to look at the menu and order food.

## 2017-06-15 NOTE — Progress Notes (Signed)
Progress Note  Patient Name: Donald [name] Date of Encounter: 06/15/2017  Primary Cardiologist: Dr [name]  Subjective   Still with mild DOE; no chest pain  Inpatient Medications    Scheduled Meds: . apixaban  5 mg Oral BID  . bisoprolol  5 mg Oral QHS  . fenofibrate  160 mg Oral Daily  . furosemide  40 mg Intravenous BID  . latanoprost  1 drop Left Eye QHS  . loratadine  10 mg Oral Daily  . potassium chloride  10 mEq Oral Daily  . pravastatin  40 mg Oral QHS  . sodium chloride flush  3 mL Intravenous Q12H  . sodium chloride flush  3 mL Intravenous Q12H  . sodium chloride flush  3 mL Intravenous Q12H  . tamsulosin  0.4 mg Oral QPM   Continuous Infusions: . sodium chloride    . sodium chloride    . sodium chloride     PRN Meds: sodium chloride, acetaminophen, fluticasone furoate-vilanterol, hydrocortisone cream, ipratropium-albuterol, ondansetron (ZOFRAN) IV, sodium chloride flush, sodium chloride flush, sodium chloride flush   Vital Signs    Vitals:   06/14/17 2100 06/15/17 0423 06/15/17 0816 06/15/17 1138  BP: 102/68 90/68 (!) 80/58 91/61  Pulse: (!) 52 88 80 81  Resp: 20 20 20 20   Temp: 97.7 F (36.5 C) 97.7 F (36.5 C) 97.7 F (36.5 C) (!) 97.5 F (36.4 C)  TempSrc: Oral Oral Oral Oral  SpO2: 92% 96% 96% 96%  Weight:  185 lb 9.6 oz (84.2 kg)    Height:        Intake/Output Summary (Last 24 hours) at 06/15/2017 1226 Last data filed at 06/15/2017 1145 Gross per 24 hour  Intake 360 ml  Output 2550 ml  Net -2190 ml   Filed Weights   06/13/17 0429 06/14/17 0404 06/15/17 0423  Weight: 190 lb 9.6 oz (86.5 kg) 187 lb 11.2 oz (85.1 kg) 185 lb 9.6 oz (84.2 kg)    Telemetry    Atrial fibrillation rate controlled - Personally Reviewed   Physical Exam   GEN: WD/WN No acute distress.   Neck: supple Cardiac: irregular, 2/6 systolic murmur Respiratory: Clear to auscultation bilaterally; no wheeze GI: Soft, nontender, non-distended, no masses MS: No  edema Neuro:  Grossly intact   Labs    Chemistry Recent Labs  Lab 06/08/17 1506  06/14/17 0406 06/15/17 0448 06/15/17 0850  NA 142   < > 138 139 139  K 4.1   < > 3.9 3.7 4.1  CL 110   < > 103 99* 102  CO2 25   < > 27 28 27   GLUCOSE 91   < > 114* 107* 116*  BUN 17   < > 35* 35* 34*  CREATININE 0.84   < > 1.31* 1.25* 1.26*  CALCIUM 9.1   < > 9.1 9.2 9.2  PROT 6.0*  --   --   --   --   ALBUMIN 3.5  --   --   --   --   AST 24  --   --   --   --   ALT 16*  --   --   --   --   ALKPHOS 34*  --   --   --   --   BILITOT 0.7  --   --   --   --   GFRNONAA >60   < > 47* 50* 49*  GFRAA >60   < > 54* 57*  57*  ANIONGAP 7   < > 8 12 10    < > = values in this interval not displayed.     Hematology Recent Labs  Lab 06/10/17 2114 06/11/17 0606 06/15/17 0448  WBC 5.9 5.1 4.7  RBC 3.97* 3.79* 3.88*  HGB 12.1* 11.6* 12.2*  HCT 37.9* 36.5* 37.4*  MCV 95.5 96.3 96.4  MCH 30.5 30.6 31.4  MCHC 31.9 31.8 32.6  RDW 14.2 14.3 14.5  PLT 210 196 201    Cardiac Enzymes Recent Labs  Lab 06/11/17 0207 06/11/17 0606 06/11/17 1301  TROPONINI <0.03 0.03* 0.03*    Recent Labs  Lab 06/10/17 2128  TROPIPOC 0.01     BNP Recent Labs  Lab 06/10/17 2217  BNP 930.7*     Patient Profile     Donald [name] is a 84 y.o. 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     Assessment & Plan    1 acute combined systolic/diastolic congestive heart failure-symptoms continue to improve.  Continue present dose of Lasix and follow renal function.  Atrial fibrillation likely with significant contribution heart failure symptoms.  2 persistent atrial fibrillation-this is likely contributing to his congestive heart failure.  Continue beta-blocker for rate control.   Continue apixaban at present dose.  Plan for cardioversion today.  If he does not hold sinus rhythm would likely treat with amiodarone long-term.  3 mitral regurgitation-moderate to severe mitral regurgitation noted on echocardiogram.  This is also a new finding.  Given age and prior CABG (and thus need for redo) would like to be conservative if possible.  We will plan repeat echocardiogram once sinus reestablished.  If we feel mitral regurgitation is contributing to congestive heart failure in the future we could consider referral for mitral clip.  4 acute kidney disease-renal function unchanged today; continue to follow.  5 coronary artery disease status post coronary artery bypass and graft-continue statin.  No aspirin given need for anticoagulation.  For questions or updates, please contact [place] Please consult [email or web address] for contact info under Cardiology/STEMI.      Signed, Kirk Ruths, MD  06/15/2017, 12:26 PM

## 2017-06-16 ENCOUNTER — Encounter (HOSPITAL_COMMUNITY): Payer: Self-pay | Admitting: Cardiovascular Disease

## 2017-06-16 ENCOUNTER — Telehealth: Payer: Self-pay | Admitting: Cardiology

## 2017-06-16 ENCOUNTER — Inpatient Hospital Stay (HOSPITAL_COMMUNITY): Admission: RE | Admit: 2017-06-16 | Payer: Medicare Other | Source: Ambulatory Visit

## 2017-06-16 DIAGNOSIS — I480 Paroxysmal atrial fibrillation: Secondary | ICD-10-CM

## 2017-06-16 DIAGNOSIS — I340 Nonrheumatic mitral (valve) insufficiency: Secondary | ICD-10-CM

## 2017-06-16 DIAGNOSIS — I5041 Acute combined systolic (congestive) and diastolic (congestive) heart failure: Principal | ICD-10-CM | POA: Diagnosis present

## 2017-06-16 HISTORY — DX: Nonrheumatic mitral (valve) insufficiency: I34.0

## 2017-06-16 LAB — BASIC METABOLIC PANEL
ANION GAP: 9 (ref 5–15)
BUN: 39 mg/dL — ABNORMAL HIGH (ref 6–20)
CHLORIDE: 100 mmol/L — AB (ref 101–111)
CO2: 30 mmol/L (ref 22–32)
Calcium: 9.1 mg/dL (ref 8.9–10.3)
Creatinine, Ser: 1.36 mg/dL — ABNORMAL HIGH (ref 0.61–1.24)
GFR calc non Af Amer: 45 mL/min — ABNORMAL LOW (ref >60)
GFR, EST AFRICAN AMERICAN: 52 mL/min — AB (ref >60)
Glucose, Bld: 107 mg/dL — ABNORMAL HIGH (ref 65–99)
Potassium: 4 mmol/L (ref 3.5–5.1)
SODIUM: 139 mmol/L (ref 135–145)

## 2017-06-16 MED ORDER — HYDROCORTISONE 1 % EX CREA: 1.0000 "application " | TOPICAL_CREAM | Freq: Three times a day (TID) | CUTANEOUS | 0 refills | Status: DC | PRN

## 2017-06-16 MED ORDER — FUROSEMIDE 40 MG PO TABS: 40.0000 mg | ORAL_TABLET | Freq: Every day | ORAL | 1 refills | Status: DC

## 2017-06-16 MED ORDER — FUROSEMIDE 40 MG PO TABS: 40.0000 mg | ORAL_TABLET | Freq: Every day | ORAL | Status: DC

## 2017-06-16 NOTE — Telephone Encounter (Signed)
Patient currently admitted

## 2017-06-16 NOTE — Progress Notes (Signed)
[facility] aware of pt manual blood pressure, pt nonsymptomatic, stated okay to give lasix

## 2017-06-16 NOTE — Care Management Note (Signed)
Case Management Note  Patient Details  Name: Donald [name] MRN: [id number] Date of Birth: [birth year]  Subjective/Objective:    New Onset CHF            Action/Plan: Patient lives at home alone; PCP: [name], [name], MD; has private insurance with Medicare /BCBS with prescription drug coverage; pharmacy of choice is [facility] and [place]; patient reports no problem getting his medication; He continues to drive his vehicle and doe not have any DME at home; noted Physical Therapy recommendation for HHPT; patient is refusing any HHC at this time; CM informed the patient that if he changed his mind, his PCP can make the arrangements from the office. CM will continue to follow for progression of care.  Expected Discharge Date:  Possibly 06/16/2017             Expected Discharge Plan:  [place]  Discharge planning Services  CM Consult    HH Arranged:  Patient Refused [facility]  Status of Service:  In process, will continue to follow  Sherrilyn Rist [phone] 06/16/2017, 12:00 PM

## 2017-06-16 NOTE — Discharge Summary (Signed)
Physician Discharge Summary  Donald [name]:[id number] DOB: [birth year] DOA: 06/10/2017  PCP: Haywood Pao, MD  Admit date: 06/10/2017 Discharge date: 06/16/2017  Time spent: 60 minutes  Recommendations for Outpatient Follow-up:  1. Follow-up with Donald Deforest, PA cardiology June 28, 2017.  Patient will need a basic metabolic profile done to follow-up on electrolytes and renal function.  A. fib and CHF will need to be followed up upon. 2. Follow-up with Dr. [name] 09/27/2017 3. Follow-up with [name], Donald Him, MD in 1-2 weeks.   Discharge Diagnoses:  Principal Problem:   Acute combined systolic and diastolic congestive heart failure (HCC) Active Problems:   AF (paroxysmal atrial fibrillation) (HCC)   New onset of congestive heart failure (HCC)   Moderate to severe mitral regurgitation   Coronary atherosclerosis   AKI (acute kidney injury) ([place])   Normocytic normochromic anemia   Discharge Condition: Stable and improved  Diet recommendation: Heart healthy  Filed Weights   06/15/17 0423 06/15/17 1234 06/16/17 0626  Weight: 84.2 kg (185 lb 9.6 oz) 83.9 kg (185 lb) 83.3 kg (183 lb 9.6 oz)    History of present illness:  Per Dr. [name] is a 84 y.o. male with medical history significant of CAD, ischemic cardiomyopathy, atrial fibrillation on Eliquis followed by Dr. [name], prostate cancer s/p APC; who presented with complaints of progressively worsening shortness of breath over the last 2 weeks.  At home he had been noticing that his heart rates were elevated into the 130s.  Symptoms of dyspnea on exertion significantly worsened 4 days ago.  He was evaluated by cardiology 2 days prior to admission, and due to the persistent atrial fibrillation started on bisprolol.  They had planned on doing cardioversion sometime next week.  He followed up on day prior to admission, and due to worsening shortness of breath was started on Lasix and potassium chloride.  They  advised [name] to hold the ramipril due to soft blood pressures.  Associated symptoms include generalized fatigue, lower extremity swelling, chest tightness, progressively worsening abdominal distention, reported 25 pound weight gain since January, and intermittent wheezing.  Denied having any significant chest pain, cough, orthopnea, nausea, vomiting, fever, chills, diarrhea, constipation, or loss of consciousness.  ED Course: Upon admission into the emergency department patient was seen to be still in atrial fibrillation with heart rates up to 136 and soft blood pressures.  Labs revealed hemoglobin 12.1, BUN 32, creatinine 1.29, BNP 930, and troponin negative.  Chest x-ray showed bilateral pleural effusions with right airspace opacity concerning for mild edema.  Patient was given initially 20 mg of potassium orally.  TRH called to admit.     Hospital Course:  #1 new onset acute combined systolic and diastolic heart failure Patient was admitted with new onset acute combined systolic and diastolic heart failure likely felt secondary to A. fib with RVR.  Patient was placed on IV Lasix with good diuresis.  Iliac enzymes which were cycled plateaued at 0.03.  2D echo was which showed a EF of 40-45%, akinesis of apical myocardium, severely dilated left atrium, moderate to severe mitral valvular regurgitation.  Cardiology was consulted and followed the patient throughout the hospitalization.  Diuresed well during the hospitalization and subsequently transitioned to oral Lasix.  Patient also underwent cardioversion during this hospitalization.  Patient will follow up with cardiology in the outpatient setting.  2.  Persistent atrial fibrillation Patient noted to be in A. fib on admission and during the hospitalization.  Cardiology was  consulted and patient was maintained on bisoprolol for rate control.  Patient was also maintained on Eliquis for anticoagulation.  It was felt per cardiology that patient needed to  be diuresis substantially prior to cardioversion.  Patient was placed on IV Lasix and diuresed well during the hospitalization.  Patient subsequently underwent cardioversion successfully which was scheduled for 06/15/2017.  Patient remained in sinus rhythm post cardioversion.  Patient will be discharged on bisoprolol and apixaban.  Per cardiology if patient was to develop recurrent atrial fibrillation would recommend addition of amiodarone.  Outpatient follow-up with cardiology.  3.  Moderate to severe mitral valvular regurgitation Per 2D echo.  Outpatient follow-up with cardiology.  4.  Acute kidney injury Felt likely to be secondary to acute heart failure in the setting of ARB and ACE inhibitor.  ARB and ACE inhibitor were discontinued.  Patient was diuresed with IV Lasix.  Renal function improved and was stable by day of discharge.  Outpatient follow-up.  5.  Normocytic anemia H&H remained stable.  No episodes of bleeding noted.   Procedures:  2D echo 06/13/2017  Chest x-ray 06/10/2017  CT chest 06/10/2017  CT abdomen and pelvis 06/10/2017  Cardioversion per Dr. Acie Fredrickson 06/15/2017  Consultations:  Cardiology: Dr.Croitoru 06/11/2017  Discharge Exam: Vitals:   06/16/17 0849 06/16/17 1100  BP: 92/60 118/80  Pulse:  67  Resp:  18  Temp:  98.1 F (36.7 C)  SpO2:  98%    General: NAD Cardiovascular: RRR Respiratory: CTAB  Discharge Instructions   Discharge Instructions    Diet - low sodium heart healthy   Complete by:  As directed    Increase activity slowly   Complete by:  As directed      Allergies as of 06/16/2017      Reactions   Keflex [cephalexin] Nausea And Vomiting, Other (See Comments)   'Killed all GI enzymes" (also) and patient prefers to not take this   Morphine Nausea Only      Medication List    STOP taking these medications   ramipril 2.5 MG capsule Commonly known as:  ALTACE     TAKE these medications   albuterol 108 (90 Base) MCG/ACT  inhaler Commonly known as:  PROVENTIL HFA;VENTOLIN HFA Inhale 1 puff into the lungs every 6 (six) hours as needed for wheezing or shortness of breath.   bisoprolol 5 MG tablet Commonly known as:  ZEBETA Take 1 tablet (5 mg total) by mouth daily. What changed:  when to take this   BREO ELLIPTA 100-25 MCG/INH Aepb Generic drug:  fluticasone furoate-vilanterol Inhale 1 puff into the lungs daily as needed (shortness of breath).   ELIQUIS 5 MG Tabs tablet Generic drug:  apixaban Take 5 mg by mouth 2 (two) times daily.   fenofibrate micronized 200 MG capsule Commonly known as:  LOFIBRA Take 1 capsule (200 mg total) by mouth daily.   FLOMAX 0.4 MG Caps capsule Generic drug:  tamsulosin Take 0.4 mg by mouth every evening.   furosemide 40 MG tablet Commonly known as:  LASIX Take 1 tablet (40 mg total) by mouth daily. What changed:    medication strength  how much to take  how to take this  when to take this  additional instructions   hydrocortisone cream 1 % Apply 1 application topically 3 (three) times daily as needed for itching (skin irritation).   latanoprost 0.005 % ophthalmic solution Commonly known as:  XALATAN Place 1 drop into the left eye at bedtime.   loratadine  10 MG tablet Commonly known as:  CLARITIN Take 10 mg by mouth daily.   multivitamin with minerals Tabs tablet Take 1 tablet by mouth daily.   potassium chloride 10 MEQ tablet Commonly known as:  K-DUR Take 1 tablet (10 mEq total) by mouth daily.   pravastatin 40 MG tablet Commonly known as:  PRAVACHOL Take 1 tablet (40 mg total) by mouth at bedtime.   Vitamin D 2000 units tablet Take 2,000 Units by mouth daily.      Allergies  Allergen Reactions  . Keflex [Cephalexin] Nausea And Vomiting and Other (See Comments)    'Killed all GI enzymes" (also) and patient prefers to not take this  . Morphine Nausea Only   Follow-up Information    [name], Donald Him, MD. Schedule an appointment as  soon as possible for a visit in 1 week(s).   Specialty:  Internal Medicine Why:  f/u in 1-2 weeks. Contact information: [place] [address] [phone]        Donald [name], [place] Follow up on 06/28/2017.   Specialties:  Cardiology, Radiology Why:  Please arrive 15 minutes early for your 10am cardiology appointment Contact information: [address] [place] [address] [phone]        Lelon Perla, MD Follow up on 09/27/2017.   Specialty:  Cardiology Why:  Please arrive 15 minutes early for your 9:40am cardiology appointment Contact information: [address] [place] [address] [phone]            The results of significant diagnostics from this hospitalization (including imaging, microbiology, ancillary and laboratory) are listed below for reference.    Significant Diagnostic Studies: Dg Chest 2 View  Result Date: 06/10/2017 CLINICAL DATA:  Shortness of breath, checkup EXAM: CHEST - 2 VIEW COMPARISON:  Chest x-ray of 09/17/2011 FINDINGS: There are small pleural effusions present right slightly larger than left. There is also cardiomegaly present, findings which may indicate mild congestive heart failure. No edema is seen. Median sternotomy sutures are noted from prior CABG. There are degenerative changes in the mid to lower thoracic spine. IMPRESSION: 1. Small bilateral pleural pleural effusions with cardiomegaly. Question mild CHF. 2. Prior CABG. Electronically Signed   By: Ivar Drape M.D.   On: 06/10/2017 13:39   Ct Chest W Contrast  Result Date: 06/10/2017 CLINICAL DATA:  Subacute onset of shortness of breath and generalized chest tightness. Atrial fibrillation. Recent weight gain. EXAM: CT CHEST, ABDOMEN, AND PELVIS WITH CONTRAST TECHNIQUE: Multidetector CT imaging of the chest, abdomen and pelvis was performed following the standard protocol during bolus administration of intravenous contrast. CONTRAST:  167mL  ISOVUE-300 IOPAMIDOL (ISOVUE-300) INJECTION 61% COMPARISON:  CT of the abdomen and pelvis performed 03/12/2017 FINDINGS: CT CHEST FINDINGS Cardiovascular: The heart is normal in size. Diffuse coronary artery calcifications are seen. The patient is status post median sternotomy. Mild calcification is noted along the aortic arch and proximal great vessels. Mediastinum/Nodes: Scattered calcified mediastinal and hilar nodes are noted. No pericardial effusion is identified. No mediastinal lymphadenopathy is seen. The thyroid gland is unremarkable. No axillary lymphadenopathy is appreciated. Lungs/Pleura: Small bilateral pleural effusions are noted. Minimal right-sided airspace opacity may reflect mild interstitial edema. No pneumothorax is seen. No masses are identified. Musculoskeletal: No acute osseous abnormalities are identified. The visualized musculature is unremarkable in appearance. CT ABDOMEN PELVIS FINDINGS Hepatobiliary: The liver is unremarkable in appearance. Mild soft tissue inflammation about the gallbladder is nonspecific in the presence of ascites. The common  bile duct remains normal in caliber. Trace ascites is noted about the liver. Pancreas: The pancreas is within normal limits. Spleen: Scattered calcified granulomata are noted within the spleen. The spleen is otherwise unremarkable. Adrenals/Urinary Tract: The adrenal glands are unremarkable in appearance. Nonspecific perinephric stranding is noted bilaterally. There is no evidence of hydronephrosis. Mild scarring is noted at the lower pole of the kidneys. No renal or ureteral stones are identified. Stomach/Bowel: The stomach is unremarkable in appearance. The small bowel is within normal limits. The appendix is normal in caliber, without evidence of appendicitis. The colon is unremarkable in appearance. Vascular/Lymphatic: Scattered calcification is seen along the abdominal aorta and its branches. The abdominal aorta is otherwise grossly  unremarkable. The inferior vena cava is grossly unremarkable. No retroperitoneal lymphadenopathy is seen. No pelvic sidewall lymphadenopathy is identified. Reproductive: Mild soft tissue inflammation about the bladder may reflect cystitis. The prostate remains normal in size, with scattered brachytherapy seeds. Other: Mild soft tissue inflammation is noted along the patient's pannus. Musculoskeletal: No acute osseous abnormalities are identified. Facet disease is noted along the lumbar spine, with underlying vacuum phenomenon. The visualized musculature is unremarkable in appearance. IMPRESSION: 1. Small bilateral pleural effusions. Minimal right-sided airspace opacity may reflect mild interstitial edema. 2. Mild soft tissue inflammation about the bladder may reflect cystitis. 3. Diffuse coronary artery calcifications. 4. Changes of prior granulomatous disease, with calcified mediastinal and hilar nodes. 5. Mild soft tissue inflammation about the gallbladder is nonspecific in the presence of ascites. Trace ascites noted about the liver. 6. Mild soft tissue inflammation along the patient's pannus. Electronically Signed   By: Garald Balding M.D.   On: 06/10/2017 23:28   Ct Abdomen Pelvis W Contrast  Result Date: 06/10/2017 CLINICAL DATA:  Subacute onset of shortness of breath and generalized chest tightness. Atrial fibrillation. Recent weight gain. EXAM: CT CHEST, ABDOMEN, AND PELVIS WITH CONTRAST TECHNIQUE: Multidetector CT imaging of the chest, abdomen and pelvis was performed following the standard protocol during bolus administration of intravenous contrast. CONTRAST:  148mL ISOVUE-300 IOPAMIDOL (ISOVUE-300) INJECTION 61% COMPARISON:  CT of the abdomen and pelvis performed 03/12/2017 FINDINGS: CT CHEST FINDINGS Cardiovascular: The heart is normal in size. Diffuse coronary artery calcifications are seen. The patient is status post median sternotomy. Mild calcification is noted along the aortic arch and proximal  great vessels. Mediastinum/Nodes: Scattered calcified mediastinal and hilar nodes are noted. No pericardial effusion is identified. No mediastinal lymphadenopathy is seen. The thyroid gland is unremarkable. No axillary lymphadenopathy is appreciated. Lungs/Pleura: Small bilateral pleural effusions are noted. Minimal right-sided airspace opacity may reflect mild interstitial edema. No pneumothorax is seen. No masses are identified. Musculoskeletal: No acute osseous abnormalities are identified. The visualized musculature is unremarkable in appearance. CT ABDOMEN PELVIS FINDINGS Hepatobiliary: The liver is unremarkable in appearance. Mild soft tissue inflammation about the gallbladder is nonspecific in the presence of ascites. The common bile duct remains normal in caliber. Trace ascites is noted about the liver. Pancreas: The pancreas is within normal limits. Spleen: Scattered calcified granulomata are noted within the spleen. The spleen is otherwise unremarkable. Adrenals/Urinary Tract: The adrenal glands are unremarkable in appearance. Nonspecific perinephric stranding is noted bilaterally. There is no evidence of hydronephrosis. Mild scarring is noted at the lower pole of the kidneys. No renal or ureteral stones are identified. Stomach/Bowel: The stomach is unremarkable in appearance. The small bowel is within normal limits. The appendix is normal in caliber, without evidence of appendicitis. The colon is unremarkable in appearance. Vascular/Lymphatic: Scattered calcification  is seen along the abdominal aorta and its branches. The abdominal aorta is otherwise grossly unremarkable. The inferior vena cava is grossly unremarkable. No retroperitoneal lymphadenopathy is seen. No pelvic sidewall lymphadenopathy is identified. Reproductive: Mild soft tissue inflammation about the bladder may reflect cystitis. The prostate remains normal in size, with scattered brachytherapy seeds. Other: Mild soft tissue inflammation is  noted along the patient's pannus. Musculoskeletal: No acute osseous abnormalities are identified. Facet disease is noted along the lumbar spine, with underlying vacuum phenomenon. The visualized musculature is unremarkable in appearance. IMPRESSION: 1. Small bilateral pleural effusions. Minimal right-sided airspace opacity may reflect mild interstitial edema. 2. Mild soft tissue inflammation about the bladder may reflect cystitis. 3. Diffuse coronary artery calcifications. 4. Changes of prior granulomatous disease, with calcified mediastinal and hilar nodes. 5. Mild soft tissue inflammation about the gallbladder is nonspecific in the presence of ascites. Trace ascites noted about the liver. 6. Mild soft tissue inflammation along the patient's pannus. Electronically Signed   By: Garald Balding M.D.   On: 06/10/2017 23:28    Microbiology: No results found for this or any previous visit (from the past 240 hour(s)).   Labs: Basic Metabolic Panel: Recent Labs  Lab 06/11/17 0606  06/13/17 0717 06/14/17 0406 06/15/17 0448 06/15/17 0850 06/16/17 0433  NA  --    < > 139 138 139 139 139  K  --    < > 4.1 3.9 3.7 4.1 4.0  CL  --    < > 105 103 99* 102 100*  CO2  --    < > 27 27 28 27 30   GLUCOSE  --    < > 118* 114* 107* 116* 107*  BUN  --    < > 37* 35* 35* 34* 39*  CREATININE  --    < > 1.25* 1.31* 1.25* 1.26* 1.36*  CALCIUM  --    < > 9.3 9.1 9.2 9.2 9.1  MG 1.7  --   --   --  1.9  --   --    < > = values in this interval not displayed.   Liver Function Tests: No results for input(s): AST, ALT, ALKPHOS, BILITOT, PROT, ALBUMIN in the last 168 hours. Recent Labs  Lab 06/10/17 2227  LIPASE 33   No results for input(s): AMMONIA in the last 168 hours. CBC: Recent Labs  Lab 06/10/17 2114 06/11/17 0606 06/15/17 0448  WBC 5.9 5.1 4.7  NEUTROABS  --  2.8 2.9  HGB 12.1* 11.6* 12.2*  HCT 37.9* 36.5* 37.4*  MCV 95.5 96.3 96.4  PLT 210 196 201   Cardiac Enzymes: Recent Labs  Lab  06/11/17 0207 06/11/17 0606 06/11/17 1301  TROPONINI <0.03 0.03* 0.03*   BNP: BNP (last 3 results) Recent Labs    06/10/17 2217  BNP 930.7*    ProBNP (last 3 results) No results for input(s): PROBNP in the last 8760 hours.  CBG: No results for input(s): GLUCAP in the last 168 hours.     Signed:  Irine Seal MD.  [facility] 06/16/2017, 2:07 PM

## 2017-06-16 NOTE — Progress Notes (Signed)
Pt IV discontinued, catheter intact. Flat, non tender hematoma developed at IV removal. Pt denies complaints and bleeding controlled. Discharge education provided at bedside with pt. Pt has all belongings including printed prescriptions. Went over CHF teaching and elequis teaching. Pt understanding. Pt discharged via wheelchair with nurse staff.

## 2017-06-16 NOTE — Progress Notes (Signed)
Paged cardiology PA regarding pt received IV lasix 40mg  at 0853 Verifying order for PO lasix 40 mg to begin at 1215  Awaiting call back

## 2017-06-16 NOTE — Progress Notes (Signed)
PA [facility] stated to start PO lasix tomorrow 06/17/2017

## 2017-06-16 NOTE — Telephone Encounter (Signed)
New message    TOC appt with [name] on 4/1 @ 10 am per Daleen Snook

## 2017-06-16 NOTE — Progress Notes (Signed)
Progress Note  Patient Name: Donald [name] Date of Encounter: 06/16/2017  Primary Cardiologist: Dr [name]  Subjective   Denies CP or dyspnea  Inpatient Medications    Scheduled Meds: . apixaban  5 mg Oral BID  . bisoprolol  5 mg Oral QHS  . fenofibrate  160 mg Oral Daily  . furosemide  40 mg Intravenous BID  . latanoprost  1 drop Left Eye QHS  . loratadine  10 mg Oral Daily  . potassium chloride  10 mEq Oral Daily  . pravastatin  40 mg Oral QHS  . sodium chloride flush  3 mL Intravenous Q12H  . tamsulosin  0.4 mg Oral QPM   Continuous Infusions: . sodium chloride Stopped (06/15/17 1346)   PRN Meds: sodium chloride, acetaminophen, fluticasone furoate-vilanterol, hydrocortisone cream, ipratropium-albuterol, ondansetron (ZOFRAN) IV, sodium chloride flush   Vital Signs    Vitals:   06/16/17 0217 06/16/17 0626 06/16/17 0849 06/16/17 1100  BP:  (!) 92/52 92/60 118/80  Pulse: 66 64  67  Resp:  18  18  Temp:  98 F (36.7 C)  98.1 F (36.7 C)  TempSrc:  Oral  Oral  SpO2:  95%  98%  Weight:  183 lb 9.6 oz (83.3 kg)    Height:        Intake/Output Summary (Last 24 hours) at 06/16/2017 1204 Last data filed at 06/16/2017 0500 Gross per 24 hour  Intake 830 ml  Output 1300 ml  Net -470 ml   Filed Weights   06/15/17 0423 06/15/17 1234 06/16/17 0626  Weight: 185 lb 9.6 oz (84.2 kg) 185 lb (83.9 kg) 183 lb 9.6 oz (83.3 kg)    Telemetry    Sinus with PVCs - Personally Reviewed   Physical Exam   GEN: WD/WN NAD Neck: supple, no JVD Cardiac: RRR, 2/6 systolic murmur Respiratory: CTA GI: Soft, NT/ND MS: varicosities Neuro:  No focal findings   Labs    Chemistry Recent Labs  Lab 06/15/17 0448 06/15/17 0850 06/16/17 0433  NA 139 139 139  K 3.7 4.1 4.0  CL 99* 102 100*  CO2 28 27 30   GLUCOSE 107* 116* 107*  BUN 35* 34* 39*  CREATININE 1.25* 1.26* 1.36*  CALCIUM 9.2 9.2 9.1  GFRNONAA 50* 49* 45*  GFRAA 57* 57* 52*  ANIONGAP 12 10 9       Hematology Recent Labs  Lab 06/10/17 2114 06/11/17 0606 06/15/17 0448  WBC 5.9 5.1 4.7  RBC 3.97* 3.79* 3.88*  HGB 12.1* 11.6* 12.2*  HCT 37.9* 36.5* 37.4*  MCV 95.5 96.3 96.4  MCH 30.5 30.6 31.4  MCHC 31.9 31.8 32.6  RDW 14.2 14.3 14.5  PLT 210 196 201    Cardiac Enzymes Recent Labs  Lab 06/11/17 0207 06/11/17 0606 06/11/17 1301  TROPONINI <0.03 0.03* 0.03*    Recent Labs  Lab 06/10/17 2128  TROPIPOC 0.01     BNP Recent Labs  Lab 06/10/17 2217  BNP 930.7*     Patient Profile     Donald [name] is a 84 y.o. 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     Assessment & Plan    1 acute combined systolic/diastolic congestive heart failure-symptoms improved.  This is likely at least partially secondary  to restoration of sinus rhythm.  Decrease Lasix to 40 mg daily.  Take an additional 40 mg daily for increasing dyspnea or edema.  Check potassium and renal function 1 week following discharge.  2 persistent atrial fibrillation- Patient remains in sinus rhythm status post cardioversion.  Continue bisoprolol and apixaban.  If he develops recurrent atrial fibrillation I will add amiodarone.  3 mitral regurgitation-moderate to severe mitral regurgitation noted on echocardiogram.  This is also a new finding.  Given age and prior CABG (and thus need for redo) would like to be conservative if possible.  We will plan repeat echocardiogram once sinus reestablished.  If we feel mitral regurgitation is contributing to congestive heart failure in the future we could consider referral for mitral clip.  4 acute kidney disease-recheck potassium and renal function 1 week following discharge.  5 coronary artery disease status post coronary artery  bypass and graft-continue statin.  No aspirin given need for anticoagulation.  Discharge today.  Transition of care appointment 1 week.  Follow up with me in 3 months. >30 min PA and physician time D2  For questions or updates, please contact [place] Please consult [email or web address] for contact info under Cardiology/STEMI.      Signed, Kirk Ruths, MD  06/16/2017, 12:04 PM

## 2017-06-16 NOTE — Consult Note (Signed)
   [facility] Inpatient Consult   06/16/2017  Donald [name] 1926-12-13 [id number]  Patient screened for potential [facility] services. Patient is in the [place] of the [facility] services under patient's Medicare  plan.    Patient with new HF diagnosis.  Met with the patient at the bedside to explain services and assess for needs.  Patient states, "I have a granddaughter who is a trauma nurse and she lives 2 doors down from me. I don't think I have any needs at this time. Primary Care Provider:  Domenick Gong, MD confirmed.  Denies any issues at this time.  For questions contact:   Natividad Brood, RN BSN [facility] Liaison  [phone] business mobile phone Toll free office [phone]

## 2017-06-17 NOTE — CV Procedure (Addendum)
    Cardioversion Note  Donald [name] [id number] July 12, 1926  Procedure: DC Cardioversion Date of Cardioversion  :  June 15, 2017  Indications: Atrial fib   Procedure Details Consent: Obtained Time Out: Verified patient identification, verified procedure, site/side was marked, verified correct patient position, special equipment/implants available, Radiology Safety Procedures followed,  medications/allergies/relevent history reviewed, required imaging and test results available.  Performed  The patient has been on adequate anticoagulation.  The patient received IV Lidocaine 40 mg followed by  Propofol 50 mg IV   for sedation.  Synchronous cardioversion was performed at  120  joules.  The cardioversion was successful     Complications: No apparent complications Patient did tolerate procedure well.   Thayer Headings, Brooke Bonito., MD, [facility] 06/17/2017, 12:13 PM

## 2017-06-17 NOTE — Telephone Encounter (Signed)
Left a message to call back.

## 2017-06-17 NOTE — Addendum Note (Signed)
Encounter addended by: Sherran Needs, NP on: 06/17/2017 11:32 AM  Actions taken: LOS modified

## 2017-06-18 NOTE — Telephone Encounter (Signed)
Patient contacted regarding discharge from [facility] on 3/20/209.  Patient understands to follow up with provider Isaac Laud, [place] on 06/28/17 at [place] [facility]. Patient understands discharge instructions? YES Patient understands medications and regiment? YES Patient understands to bring all medications to this visit? YEs

## 2017-06-23 ENCOUNTER — Other Ambulatory Visit: Payer: Self-pay

## 2017-06-23 NOTE — Patient Outreach (Signed)
[facility]) Care Management  06/23/2017  Donald [name] [birth year] [id number]      EMMI-HF RED ON EMMI ALERT Day # 5 Date: 06/22/17 Red Alert Reason: "Weight? 182 lbs"   Outreach attempt # 1 to patient. Spoke with patient who reports he is doing well. Reviewed and addressed red alert with patient. He confirms that the weight was accurate.His weight this morning was 181 lbs. He voices that his weight has been fluctuating a few pounds. RN CM reviewed with patient s/s of worsening condition and when to alert MD of weight changes. Patient is keeping a computerized log of his weights. He voices he is adhering to med regimen and taking diuretic. He stets that he feels like he should be losing more weight while taking diuretic. He denies any swelling/edema but does voices some occasional SOB with exertion. He goes for follow up appt tomorrow and plans to discuss diuretic and his current condition with PA. RN CM encouraged patient to also continue to adhere to low salt diet as this may be affecting his weight as well. He voiced understanding. He denies any issues regarding meds and transportation. He voices no further RN CM needs or concerns at this time. Advised patient that they would continue to get automated EMMI- HF post discharge calls to assess how they are doing following recent hospitalization and will receive a call from a nurse if any of their responses were abnormal. Patient voiced understanding and was appreciative of f/u call.       Plan: RN CM will close case at this time as no further interventions needed.    [name], RN,BSN,CCM [facility] Telephonic Care Management Coordinator Direct Phone: [phone] Toll Free: [phone] Fax: [phone]

## 2017-06-24 ENCOUNTER — Ambulatory Visit (HOSPITAL_COMMUNITY)
Admission: RE | Admit: 2017-06-24 | Discharge: 2017-06-24 | Disposition: A | Discharge status: Home / Self Care | Payer: Medicare Other | Source: Ambulatory Visit | Attending: Nurse Practitioner | Admitting: Nurse Practitioner

## 2017-06-24 ENCOUNTER — Encounter (HOSPITAL_COMMUNITY): Payer: Self-pay | Admitting: Nurse Practitioner

## 2017-06-24 ENCOUNTER — Other Ambulatory Visit: Payer: Self-pay

## 2017-06-24 VITALS — BP 104/72 | HR 96 | Ht 69.0 in | Wt 187.0 lb

## 2017-06-24 DIAGNOSIS — H409 Unspecified glaucoma: Secondary | ICD-10-CM | POA: Diagnosis not present

## 2017-06-24 DIAGNOSIS — I2510 Atherosclerotic heart disease of native coronary artery without angina pectoris: Secondary | ICD-10-CM | POA: Diagnosis not present

## 2017-06-24 DIAGNOSIS — I255 Ischemic cardiomyopathy: Secondary | ICD-10-CM | POA: Insufficient documentation

## 2017-06-24 DIAGNOSIS — I481 Persistent atrial fibrillation: Secondary | ICD-10-CM | POA: Insufficient documentation

## 2017-06-24 DIAGNOSIS — Z7901 Long term (current) use of anticoagulants: Secondary | ICD-10-CM | POA: Insufficient documentation

## 2017-06-24 DIAGNOSIS — Z87442 Personal history of urinary calculi: Secondary | ICD-10-CM | POA: Insufficient documentation

## 2017-06-24 DIAGNOSIS — R9431 Abnormal electrocardiogram [ECG] [EKG]: Secondary | ICD-10-CM | POA: Diagnosis not present

## 2017-06-24 DIAGNOSIS — Z955 Presence of coronary angioplasty implant and graft: Secondary | ICD-10-CM | POA: Insufficient documentation

## 2017-06-24 DIAGNOSIS — J45909 Unspecified asthma, uncomplicated: Secondary | ICD-10-CM | POA: Insufficient documentation

## 2017-06-24 DIAGNOSIS — Z79899 Other long term (current) drug therapy: Secondary | ICD-10-CM | POA: Diagnosis not present

## 2017-06-24 DIAGNOSIS — Z8673 Personal history of transient ischemic attack (TIA), and cerebral infarction without residual deficits: Secondary | ICD-10-CM | POA: Insufficient documentation

## 2017-06-24 DIAGNOSIS — I340 Nonrheumatic mitral (valve) insufficiency: Secondary | ICD-10-CM | POA: Diagnosis not present

## 2017-06-24 DIAGNOSIS — Z8546 Personal history of malignant neoplasm of prostate: Secondary | ICD-10-CM | POA: Diagnosis not present

## 2017-06-24 DIAGNOSIS — R0602 Shortness of breath: Secondary | ICD-10-CM | POA: Diagnosis present

## 2017-06-24 DIAGNOSIS — Z885 Allergy status to narcotic agent status: Secondary | ICD-10-CM | POA: Diagnosis not present

## 2017-06-24 DIAGNOSIS — Z881 Allergy status to other antibiotic agents status: Secondary | ICD-10-CM | POA: Diagnosis not present

## 2017-06-24 DIAGNOSIS — I4510 Unspecified right bundle-branch block: Secondary | ICD-10-CM | POA: Diagnosis not present

## 2017-06-24 DIAGNOSIS — I4819 Other persistent atrial fibrillation: Secondary | ICD-10-CM

## 2017-06-24 LAB — BASIC METABOLIC PANEL
Anion gap: 8 (ref 5–15)
BUN: 30 mg/dL — ABNORMAL HIGH (ref 6–20)
CHLORIDE: 108 mmol/L (ref 101–111)
CO2: 27 mmol/L (ref 22–32)
CREATININE: 1.32 mg/dL — AB (ref 0.61–1.24)
Calcium: 9.3 mg/dL (ref 8.9–10.3)
GFR calc non Af Amer: 46 mL/min — ABNORMAL LOW (ref >60)
GFR, EST AFRICAN AMERICAN: 54 mL/min — AB (ref >60)
Glucose, Bld: 114 mg/dL — ABNORMAL HIGH (ref 65–99)
Potassium: 4 mmol/L (ref 3.5–5.1)
Sodium: 143 mmol/L (ref 135–145)

## 2017-06-24 LAB — MAGNESIUM: Magnesium: 2 mg/dL (ref 1.7–2.4)

## 2017-06-24 MED ORDER — BISOPROLOL FUMARATE 5 MG PO TABS: 2.5000 mg | ORAL_TABLET | Freq: Every day | ORAL | 6 refills | Status: DC

## 2017-06-24 MED ORDER — AMIODARONE HCL 200 MG PO TABS: 200.0000 mg | ORAL_TABLET | Freq: Two times a day (BID) | ORAL | 0 refills | Status: DC

## 2017-06-24 NOTE — Patient Instructions (Signed)
Start Amiodarone 200mg  twice a day   Decrease bisoprolol to 1/2 tablet once a day  PFT (lung function testing) Arrive at the main entrance of the hospital and ask to be taken to respiratory. Arrive 15 minutes prior to the appointment. No smoking, inhalers or caffeine 4 hours prior to testing.

## 2017-06-24 NOTE — Progress Notes (Signed)
Primary Care Physician: [name], [name], MD Referring Physician: Dr. [name] Cardiologist: Dr. Caprice Renshaw is a 84 y.o. male with a h/o asthma, CAD,CVA, that is in the afib clinic today for evulation of shortness of breath. Pt has been suspected of having afib in the past, but has has never been documented. He called  Dr. Jacalyn Lefevre office with c/o of shortness of breath and referred here.  His EKG shows afib at 130 bpm, RBBB, qrs int 136 ms, qtc 503 ms.   For his age he is highly functioning, not aware of the rapid HR, just the shortness of breath. He has been noticing these symptoms for about one month. He is already on eliquis 5 mg bid, chadsvasc score of at least 4. His weight was 180 lbs in a hospital gown when he had lithotripsy 1/31. Today it is 197, fully dressed. He admits to eating more and gaining 30 lbs over the last year. Most of the shortness of breath is with exertion.  No excessive caffeine, no alcohol, tobacco, denies snoring.   F/u in afib clinic after starting bisoprolol 5 mg daily. His HR was better controlled now. He was not sure on last visit if he was persistent or paroxysmal but with his symptoms of exertional shortness of breath  and additional weight gain, I feel he is persistent. He has not missed any doses of eliquis for at least 3 weeks so will plan for cardioversion. Will plan on CXR today and starting diuretics.   F/u afib clinic, 3/28, since I saw him last, pt's shortness of breath worsened and he was admitted  with CHF. He was diuresed and cardioverted. Unfortunately, he is back in afib today, rate controlled. Echo showed EF of 40-45%, left/rt atrium severely dilated, mod to severe regurgitation, TR mod regurgitation. Pt has noted that shortness of breath became more noticeable after 3 days after cardioversion. He states that weight at home is stable, 187 today. Pt's TSH has been elvated but PCP checked the full thyroid panel and other values were  normal  Today, he denies symptoms of palpitations, chest pain, shortness of breath, orthopnea, PND, lower extremity edema, dizziness, presyncope, syncope, or neurologic sequela. The patient is tolerating medications without difficulties and is otherwise without complaint today.   Past Medical History:  Diagnosis Date  . Asthma    with worsening with beta blockade  . CAD (coronary artery disease)   . CVA (cerebral infarction) 2012   tia, mild no residual defecits  . GI bleed 8 yrs ago   due to doll fully vessel which was clipped  . Glaucoma    excellent cataracts  . History of kidney stones   . Ischemic cardiomyopathy   . Moderate to severe mitral regurgitation 06/16/2017  . Nephrolithiasis   . Post-infarction apical thrombus ([place])   . Prostate cancer ([place]) 2012  . Radiation proctitis Feb 2015   treated with APC  . Tubular adenoma 04/2013   Dr. Hilarie Fredrickson   Past Surgical History:  Procedure Laterality Date  . cardiac stents  2003  . CARDIOVERSION N/A 06/15/2017   Procedure: CARDIOVERSION;  Surgeon: Acie Fredrickson Wonda Cheng, MD;  Location: [facility] ENDOSCOPY;  Service: Cardiovascular;  Laterality: N/A;  . COLONOSCOPY WITH PROPOFOL N/A 05/05/2013   Procedure: COLONOSCOPY WITH PROPOFOL;  Surgeon: Jerene Bears, MD;  Location: [facility] ENDOSCOPY;  Service: Gastroenterology;  Laterality: N/A;  . CORONARY ARTERY BYPASS GRAFT  1998   LIMA to the LAD and saphenous  vein graft tothe diagonal  . EXTRACORPOREAL SHOCK WAVE LITHOTRIPSY Right 04/29/2017   Procedure: RIGHT EXTRACORPOREAL SHOCK WAVE LITHOTRIPSY (ESWL);  Surgeon: Alexis Frock, MD;  Location: [facility] ORS;  Service: Urology;  Laterality: Right;  . history of radiation treatment  2012   x 40 treatments done  . KNEE ARTHROSCOPY  2016 or 2017   Dr Gladstone Lighter ;   . PARTIAL KNEE ARTHROPLASTY Right 01/13/2017   Procedure: UNICOMPARTMENTAL RIGHT KNEE;  Surgeon: Gaynelle Arabian, MD;  Location: [facility] ORS;  Service: Orthopedics;  Laterality: Right;  with block    Current  Outpatient Medications  Medication Sig Dispense Refill  . albuterol (PROVENTIL HFA;VENTOLIN HFA) 108 (90 Base) MCG/ACT inhaler Inhale 1 puff into the lungs every 6 (six) hours as needed for wheezing or shortness of breath.    Marland Kitchen apixaban (ELIQUIS) 5 MG TABS tablet Take 5 mg by mouth 2 (two) times daily.    . bisoprolol (ZEBETA) 5 MG tablet Take 0.5 tablets (2.5 mg total) by mouth daily. 30 tablet 6  . Cholecalciferol (VITAMIN D) 2000 UNITS tablet Take 2,000 Units by mouth daily.    . fenofibrate micronized (LOFIBRA) 200 MG capsule Take 1 capsule (200 mg total) by mouth daily. 90 capsule 3  . Fluticasone Furoate-Vilanterol (BREO ELLIPTA) 100-25 MCG/INH AEPB Inhale 1 puff into the lungs daily as needed (shortness of breath).     . furosemide (LASIX) 40 MG tablet Take 1 tablet (40 mg total) by mouth daily. 30 tablet 1  . hydrocortisone cream 1 % Apply 1 application topically 3 (three) times daily as needed for itching (skin irritation). 30 g 0  . latanoprost (XALATAN) 0.005 % ophthalmic solution Place 1 drop into the left eye at bedtime.     Marland Kitchen loratadine (CLARITIN) 10 MG tablet Take 10 mg by mouth daily.    . Multiple Vitamin (MULTIVITAMIN WITH MINERALS) TABS tablet Take 1 tablet by mouth daily.    . potassium chloride (K-DUR) 10 MEQ tablet Take 1 tablet (10 mEq total) by mouth daily. 30 tablet 3  . pravastatin (PRAVACHOL) 40 MG tablet Take 1 tablet (40 mg total) by mouth at bedtime. 90 tablet 4  . amiodarone (PACERONE) 200 MG tablet Take 1 tablet (200 mg total) by mouth 2 (two) times daily. 60 tablet 0  . Tamsulosin HCl (FLOMAX) 0.4 MG CAPS Take 0.4 mg by mouth every evening.      No current facility-administered medications for this encounter.     Allergies  Allergen Reactions  . Keflex [Cephalexin] Nausea And Vomiting and Other (See Comments)    'Killed all GI enzymes" (also) and patient prefers to not take this  . Morphine Nausea Only    Social History   Socioeconomic History  .  Marital status: Widowed    Spouse name: Not on file  . Number of children: 3  . Years of education: Not on file  . Highest education level: Not on file  Occupational History  . Occupation: Retired Conservation officer, [employer]  Social Needs  . Financial resource strain: Not on file  . Food insecurity:    Worry: Not on file    Inability: Not on file  . Transportation needs:    Medical: Not on file    Non-medical: Not on file  Tobacco Use  . Smoking status: Never Smoker  . Smokeless tobacco: Never Used  Substance and Sexual Activity  . Alcohol use: Yes    Alcohol/week: 0.0 oz    Comment: occasional beer or wine  .  Drug use: No  . Sexual activity: Not on file  Lifestyle  . Physical activity:    Days per week: Not on file    Minutes per session: Not on file  . Stress: Not on file  Relationships  . Social connections:    Talks on phone: Not on file    Gets together: Not on file    Attends religious service: Not on file    Active member of club or organization: Not on file    Attends meetings of clubs or organizations: Not on file    Relationship status: Not on file  . Intimate partner violence:    Fear of current or ex partner: Not on file    Emotionally abused: Not on file    Physically abused: Not on file    Forced sexual activity: Not on file  Other Topics Concern  . Not on file  Social History Narrative  . Not on file    Family History  Problem Relation Age of Onset  . Heart attack Father   . Heart disease Mother   . Stomach cancer Mother     ROS- All systems are reviewed and negative except as per the HPI above  Physical Exam: Vitals:   06/24/17 1000  BP: 104/72  Pulse: 96  Weight: 187 lb (84.8 kg)  Height: 5\' 9"  (1.753 m)   Wt Readings from Last 3 Encounters:  06/24/17 187 lb (84.8 kg)  06/16/17 183 lb 9.6 oz (83.3 kg)  06/10/17 202 lb 6.4 oz (91.8 kg)    Labs: Lab Results  Component Value Date   NA 143 06/24/2017   K 4.0 06/24/2017   CL 108  06/24/2017   CO2 27 06/24/2017   GLUCOSE 114 (H) 06/24/2017   BUN 30 (H) 06/24/2017   CREATININE 1.32 (H) 06/24/2017   CALCIUM 9.3 06/24/2017   MG 2.0 06/24/2017   Lab Results  Component Value Date   INR 1.59 06/15/2017   Lab Results  Component Value Date   CHOL 114 11/30/2012   HDL 47.00 11/30/2012   LDLCALC 56 11/30/2012   TRIG 57.0 11/30/2012     GEN- The patient is well appearing, alert and oriented x 3 today.   Head- normocephalic, atraumatic Eyes-  Sclera clear, conjunctiva pink Ears- hearing intact Oropharynx- clear Neck- supple, no JVP Lymph- no cervical lymphadenopathy Lungs- Clear to ausculation bilaterally, normal work of breathing Heart-  irregular rate and rhythm, no murmurs, rubs or gallops, PMI not laterally displaced GI- soft, NT, ND, + BS Extremities- no clubbing, cyanosis, or trace edema MS- no significant deformity or atrophy Skin- no rash or lesion Psych- euthymic mood, full affect Neuro- strength and sensation are intact  EKG-afib at 96 bpm, RBBB, qrs int 136 ms, qtc 427 Epic records reviewed TSH elevated recently but thyroid panel with PCP otherwise checked out with normal ranges Echo-Study Conclusions  - Left ventricle: The cavity size was normal. Wall thickness was   increased in a pattern of mild LVH. Systolic function was mildly   to moderately reduced. The estimated ejection fraction was in the   range of 40% to 45%. Akinesis of the apical myocardium. - Ventricular septum: The contour showed systolic flattening. These   changes are consistent with RV volume and pressure overload. - Aortic valve: Trileaflet; normal thickness, mildly calcified   leaflets. Transvalvular velocity was increased. There was mild   stenosis. Valve area (VTI): 1.28 cm^2. Valve area (Vmax): 1.35   cm^2. Valve area (Vmean):  1.21 cm^2. - Aortic root: The aortic root was mildly dilated. - Mitral valve: Mildly calcified annulus. There was moderate to   severe  regurgitation. - Left atrium: The atrium was severely dilated. - Right ventricle: The cavity size was dilated. Wall thickness was   normal. - Right atrium: The atrium was severely dilated. - Tricuspid valve: There was moderate regurgitation. - Pulmonary arteries: Systolic pressure could not be accurately   estimated. Suspect it is higher because of other characteristics.   PA peak pressure: 33 mm Hg (S).    Assessment and Plan: 1.  Persistent afib Cardioversion initially successful but ERAF Discussed further ways to restore SR Left atrium severely enlarged so may be difficult to restore and maintain SR I discussed amiodarone as a good option to try to restore SR and he is in agreement, Risk vrs benefit of drug discussed Will start load of 200 mg bid for one month Cut bisoprolol 5 mg to 1/2 tab with the addition of amiodarone Will  schedule baseline PFT's  2. Chadsvasc score of at least 4 Continue eliquis 5 mg bid States no missed doses   3.  Acute combined sys/diastolic HF Weight appears to be stable Continue daily weights and avoiding salt Continue lasix 40 mg daily Bmet/matg today  4. Asthma He states stable usually does not have to use inhaler  F/u with [name], PA 4/1 for f/u EKG and how pt is tolerating amiodarone I will be glad to see back in 3 weeks to set up cardioversion F/u with Dr. [name] Marcina Millard  Geroge Baseman. Jaya Lapka, [facility] [address] [place], [place] [address] [phone]

## 2017-06-28 ENCOUNTER — Encounter: Payer: Self-pay | Admitting: Physician Assistant

## 2017-06-28 ENCOUNTER — Ambulatory Visit (INDEPENDENT_AMBULATORY_CARE_PROVIDER_SITE_OTHER): Payer: Medicare Other | Admitting: Physician Assistant

## 2017-06-28 ENCOUNTER — Other Ambulatory Visit: Payer: Self-pay

## 2017-06-28 VITALS — BP 118/60 | HR 97 | Ht 69.0 in | Wt 188.6 lb

## 2017-06-28 DIAGNOSIS — I4819 Other persistent atrial fibrillation: Secondary | ICD-10-CM

## 2017-06-28 DIAGNOSIS — I5043 Acute on chronic combined systolic (congestive) and diastolic (congestive) heart failure: Secondary | ICD-10-CM

## 2017-06-28 DIAGNOSIS — I340 Nonrheumatic mitral (valve) insufficiency: Secondary | ICD-10-CM | POA: Diagnosis not present

## 2017-06-28 DIAGNOSIS — I25810 Atherosclerosis of coronary artery bypass graft(s) without angina pectoris: Secondary | ICD-10-CM | POA: Diagnosis not present

## 2017-06-28 DIAGNOSIS — I481 Persistent atrial fibrillation: Secondary | ICD-10-CM | POA: Diagnosis not present

## 2017-06-28 DIAGNOSIS — S91114A Laceration without foreign body of right lesser toe(s) without damage to nail, initial encounter: Secondary | ICD-10-CM | POA: Diagnosis not present

## 2017-06-28 MED ORDER — FUROSEMIDE 20 MG PO TABS: 60.0000 mg | ORAL_TABLET | Freq: Every day | ORAL | 1 refills | Status: DC

## 2017-06-28 MED ORDER — POTASSIUM CHLORIDE ER 20 MEQ PO TBCR: 20.0000 meq | EXTENDED_RELEASE_TABLET | Freq: Every day | ORAL | 1 refills | Status: DC

## 2017-06-28 MED ORDER — BISOPROLOL FUMARATE 5 MG PO TABS: 5.0000 mg | ORAL_TABLET | Freq: Every day | ORAL | 6 refills | Status: DC

## 2017-06-28 NOTE — Patient Outreach (Signed)
[facility]) Care Management  06/28/2017  Donald [name] 1926/07/24 [id number]     EMMI-HF RED ON EMMI ALERT Day # 10 Date: 06/27/17 Red Alert Reason: "Weight: 183 lbs"   Outreach attempt # 1 to patient. No answer at present. RN CM left HIPAA compliant voicemail messages along with contact info.        Plan: RN CM will make outreach attempt to patient within 3-4 business days if no return call.    [name], RN,BSN,CCM [employer] [facility] Telephonic Care Management Coordinator Direct Phone: [phone] Toll Free: [phone] Fax: [phone]

## 2017-06-28 NOTE — Patient Outreach (Signed)
[facility]) Care Management  06/28/2017  Donald [name] [birth year] [id number]     EMMI-CHF RED ON EMMI ALERT Day # 8 Date: 06/25/17 Red Alert Reason: " Went to follow up appt? No"     Red on EMMI dashboard received. No outreach call warranted to patient at this time. RN CM addressed issue on previous call. RN CM confirmed in EMR that patient did compete MD appt on 06/24/17 and also has follow up appt for today.      Plan: RN CM will close case at this time as no further interventions needed.    [name], RN,BSN,CCM [facility] [facility] Telephonic Care Management Coordinator Direct Phone: [phone] Toll Free: [phone] Fax: [phone]

## 2017-06-28 NOTE — Progress Notes (Signed)
Cardiology Office Note    Date:  06/28/2017   ID:  Donald [name], DOB [birth year], MRN [id number]  PCP:  Haywood Pao, MD  Cardiologist:  Dr. [name]  Chief Complaint  Patient presents with  . Follow-up    seen for Dr. [name], recent new atrial fibrillation    History of Present Illness:  Donald [name] is a 84 y.o. male with PMH of CAD s/p CABG 1998, h/o ICM with improved EF, prior CVA, prior mural thrombus on chronic coumadin Rx, prostate CA, and h/o GI bleed. Carotid Doppler 2005 showed mild disease. Prior abdominal ultrasound in May 2006 show no aneurysm. He had a cardiac catheterization in June 2013 that showed 100% ost LAD, distal LAD filled with RV branch off of RCA, PLA possibly occluded by most distal RCA appears to be filled from LIMA collateral, LIMA patent but does not supply distal LAD. Medical therapy was recommended. Last echocardiogram obtained on 11/13/2013 showed EF 50-55%, akinesis of the entire apical myocardium, grade 1 DD, PA peak pressure 56 mmHg. Ejection fraction has improved from the previous 40-45% in 2007. He is intolerant to all beta blockers.  Myoview obtained on 11/02/2016 showed EF 46%, defect in the distal anterior, distal inferior, apical wall consistent with small regional scar, inferior thinning consistent with soft tissue attenuation.  Otherwise no ischemia.  He was evaluated on 06/08/2017 for shortness of breath and and noted to be in atrial fibrillation with a heart rate of 130.  This is new for him.  He was also found to have some degree of heart failure.  Due to worsening dyspnea on exertion, he was eventually admitted for persistent atrial fibrillation and new onset of heart failure.  Echocardiogram obtained on 06/13/2017 showed EF 40-45%, mild aortic stenosis, mild LVH, akinesis of the apical myocardium, mild aortic root dilatation, moderate to severe MR, moderate TR. she was placed on Eliquis and underwent cardioversion on 06/15/2017.  The plan is  to repeat echocardiogram once sinus rhythm is reestablished, if we feel mitral regurgitation is contributing to heart failure in the future, may consider referral for mitral clip.  Otherwise given his age and prior CABG, would like to be conservative if possible.  He was eventually discharged with Lasix 40 mg daily.  His discharge weight was 183 pounds.  Patient presents today for cardiology office visit.  He continued to have some dyspnea on exertion.  His daughter mentioned he has gained a few pounds since come home.  He also felt like his abdomen is more distended than usual.  On physical exam, he has markedly diminished breath sounds in bilateral bases and only trace amount of lower extremity edema.  I think majority of his dyspnea on exertion is still related to atrial fibrillation, however I do agree he is mildly volume overloaded.  I will increase his Lasix to 40 mg twice daily for 2 days before dropping back down to 60 mg daily thereafter.  I will also increase his potassium supplement to 20 mEq daily.  His heart rate is borderline controlled while in atrial fibrillation currently, I will increase bisoprolol to 5 mg daily.  He can follow-up with atrial fibrillation clinic in the middle of this month, if he is still in atrial fibrillation, he will undergo DC cardioversion.  Otherwise he has been compliant with Eliquis.  After cardioversion, I would recommend follow-up with either me or A. fib clinic about a month after that, if he is able to maintain sinus rhythm,  I would likely place him on ARB/ACE inhibitor before arrange echocardiogram prior to his next visit with Dr. [name].   Past Medical History:  Diagnosis Date  . Asthma    with worsening with beta blockade  . CAD (coronary artery disease)   . CVA (cerebral infarction) 2012   tia, mild no residual defecits  . GI bleed 8 yrs ago   due to doll fully vessel which was clipped  . Glaucoma    excellent cataracts  . History of kidney stones    . Ischemic cardiomyopathy   . Moderate to severe mitral regurgitation 06/16/2017  . Nephrolithiasis   . Post-infarction apical thrombus ([place])   . Prostate cancer ([place]) 2012  . Radiation proctitis Feb 2015   treated with APC  . Tubular adenoma 04/2013   Dr. Hilarie Fredrickson    Past Surgical History:  Procedure Laterality Date  . cardiac stents  2003  . CARDIOVERSION N/A 06/15/2017   Procedure: CARDIOVERSION;  Surgeon: Acie Fredrickson Wonda Cheng, MD;  Location: [facility] ENDOSCOPY;  Service: Cardiovascular;  Laterality: N/A;  . COLONOSCOPY WITH PROPOFOL N/A 05/05/2013   Procedure: COLONOSCOPY WITH PROPOFOL;  Surgeon: Jerene Bears, MD;  Location: [facility] ENDOSCOPY;  Service: Gastroenterology;  Laterality: N/A;  . CORONARY ARTERY BYPASS GRAFT  1998   LIMA to the LAD and saphenous vein graft tothe diagonal  . EXTRACORPOREAL SHOCK WAVE LITHOTRIPSY Right 04/29/2017   Procedure: RIGHT EXTRACORPOREAL SHOCK WAVE LITHOTRIPSY (ESWL);  Surgeon: Alexis Frock, MD;  Location: [facility] ORS;  Service: Urology;  Laterality: Right;  . history of radiation treatment  2012   x 40 treatments done  . KNEE ARTHROSCOPY  2016 or 2017   Dr Gladstone Lighter ;   . PARTIAL KNEE ARTHROPLASTY Right 01/13/2017   Procedure: UNICOMPARTMENTAL RIGHT KNEE;  Surgeon: Gaynelle Arabian, MD;  Location: [facility] ORS;  Service: Orthopedics;  Laterality: Right;  with block    Current Medications: Outpatient Medications Prior to Visit  Medication Sig Dispense Refill  . albuterol (PROVENTIL HFA;VENTOLIN HFA) 108 (90 Base) MCG/ACT inhaler Inhale 1 puff into the lungs every 6 (six) hours as needed for wheezing or shortness of breath.    Marland Kitchen amiodarone (PACERONE) 200 MG tablet Take 1 tablet (200 mg total) by mouth 2 (two) times daily. 60 tablet 0  . apixaban (ELIQUIS) 5 MG TABS tablet Take 5 mg by mouth 2 (two) times daily.    . Cholecalciferol (VITAMIN D) 2000 UNITS tablet Take 2,000 Units by mouth daily.    . fenofibrate micronized (LOFIBRA) 200 MG capsule Take 1 capsule (200 mg  total) by mouth daily. 90 capsule 3  . Fluticasone Furoate-Vilanterol (BREO ELLIPTA) 100-25 MCG/INH AEPB Inhale 1 puff into the lungs daily as needed (shortness of breath).     . hydrocortisone cream 1 % Apply 1 application topically 3 (three) times daily as needed for itching (skin irritation). 30 g 0  . latanoprost (XALATAN) 0.005 % ophthalmic solution Place 1 drop into the left eye at bedtime.     Marland Kitchen loratadine (CLARITIN) 10 MG tablet Take 10 mg by mouth daily.    . Multiple Vitamin (MULTIVITAMIN WITH MINERALS) TABS tablet Take 1 tablet by mouth daily.    . pravastatin (PRAVACHOL) 40 MG tablet Take 1 tablet (40 mg total) by mouth at bedtime. 90 tablet 4  . Tamsulosin HCl (FLOMAX) 0.4 MG CAPS Take 0.4 mg by mouth every evening.     . bisoprolol (ZEBETA) 5 MG tablet Take 0.5 tablets (2.5 mg total) by mouth daily. Roberts  tablet 6  . furosemide (LASIX) 40 MG tablet Take 1 tablet (40 mg total) by mouth daily. 30 tablet 1  . potassium chloride (K-DUR) 10 MEQ tablet Take 1 tablet (10 mEq total) by mouth daily. 30 tablet 3   No facility-administered medications prior to visit.      Allergies:   Keflex [cephalexin] and Morphine   Social History   Socioeconomic History  . Marital status: Widowed    Spouse name: Not on file  . Number of children: 3  . Years of education: Not on file  . Highest education level: Not on file  Occupational History  . Occupation: Retired Conservation officer, [employer]  Social Needs  . Financial resource strain: Not on file  . Food insecurity:    Worry: Not on file    Inability: Not on file  . Transportation needs:    Medical: Not on file    Non-medical: Not on file  Tobacco Use  . Smoking status: Never Smoker  . Smokeless tobacco: Never Used  Substance and Sexual Activity  . Alcohol use: Yes    Alcohol/week: 0.0 oz    Comment: occasional beer or wine  . Drug use: No  . Sexual activity: Not on file  Lifestyle  . Physical activity:    Days per week: Not on file     Minutes per session: Not on file  . Stress: Not on file  Relationships  . Social connections:    Talks on phone: Not on file    Gets together: Not on file    Attends religious service: Not on file    Active member of club or organization: Not on file    Attends meetings of clubs or organizations: Not on file    Relationship status: Not on file  Other Topics Concern  . Not on file  Social History Narrative  . Not on file     Family History:  The patient's family history includes Heart attack in his father; Heart disease in his mother; Stomach cancer in his mother.   ROS:   Please see the history of present illness.    ROS All other systems reviewed and are negative.   PHYSICAL EXAM:   VS:  BP 118/60   Pulse 97   Ht 5\' 9"  (1.753 m)   Wt 188 lb 9.6 oz (85.5 kg)   BMI 27.85 kg/m    GEN: Well nourished, well developed, in no acute distress  HEENT: normal  Neck: no JVD, carotid bruits, or masses Cardiac: Irregularly irregular; no murmurs, rubs, or gallops,no edema  Respiratory:  clear to auscultation bilaterally, normal work of breathing GI: soft, nontender, nondistended, + BS MS: no deformity or atrophy  Skin: warm and dry, no rash Neuro:  Alert and Oriented x 3, Strength and sensation are intact Psych: euthymic mood, full affect  Wt Readings from Last 3 Encounters:  06/28/17 188 lb 9.6 oz (85.5 kg)  06/24/17 187 lb (84.8 kg)  06/16/17 183 lb 9.6 oz (83.3 kg)      Studies/Labs Reviewed:   EKG:  EKG is ordered today.  The ekg ordered today demonstrates atrial fibrillation heart rate 97  Recent Labs: 06/08/2017: ALT 16 06/10/2017: B Natriuretic Peptide 930.7; TSH 9.474 06/15/2017: Hemoglobin 12.2; Platelets 201 06/24/2017: BUN 30; Creatinine, Ser 1.32; Magnesium 2.0; Potassium 4.0; Sodium 143   Lipid Panel    Component Value Date/Time   CHOL 114 11/30/2012 0842   TRIG 57.0 11/30/2012 0842   TRIG 44 08/13/2008  HDL 47.00 11/30/2012 0842   CHOLHDL 2 11/30/2012  0842   VLDL 11.4 11/30/2012 0842   LDLCALC 56 11/30/2012 0842    Additional studies/ records that were reviewed today include:   Myoview 12/03/2016 Study Highlights    Defect in distal anterior, distal inferior and apical walls consistent with small region of scar. Inferior thinning consistent with soft tissue attenuation and/or scar No ischemia.  Nuclear stress EF: 46%.  This is an intermediate risk study.   Echo 06/13/2017 ------------------------------------------------------------------- LV EF: 40% -   45%  ------------------------------------------------------------------- Indications:      CHF - 428.0.  ------------------------------------------------------------------- History:   PMH:   Atrial fibrillation.  Cardiomyopathy.  ------------------------------------------------------------------- Study Conclusions  - Left ventricle: The cavity size was normal. Wall thickness was   increased in a pattern of mild LVH. Systolic function was mildly   to moderately reduced. The estimated ejection fraction was in the   range of 40% to 45%. Akinesis of the apical myocardium. - Ventricular septum: The contour showed systolic flattening. These   changes are consistent with RV volume and pressure overload. - Aortic valve: Trileaflet; normal thickness, mildly calcified   leaflets. Transvalvular velocity was increased. There was mild   stenosis. Valve area (VTI): 1.28 cm^2. Valve area (Vmax): 1.35   cm^2. Valve area (Vmean): 1.21 cm^2. - Aortic root: The aortic root was mildly dilated. - Mitral valve: Mildly calcified annulus. There was moderate to   severe regurgitation. - Left atrium: The atrium was severely dilated. - Right ventricle: The cavity size was dilated. Wall thickness was   normal. - Right atrium: The atrium was severely dilated. - Tricuspid valve: There was moderate regurgitation. - Pulmonary arteries: Systolic pressure could not be accurately   estimated.  Suspect it is higher because of other characteristics.   PA peak pressure: 33 mm Hg (S).    ASSESSMENT:    1. Persistent atrial fibrillation ([place])   2. Coronary artery disease involving coronary bypass graft of native heart without angina pectoris   3. Acute on chronic combined systolic and diastolic CHF (congestive heart failure) ([place])   4. Mitral valve insufficiency, unspecified etiology      PLAN:  In order of problems listed above:  1. Persistent atrial fibrillation: Recently underwent cardioversion, however had recurrent atrial fibrillation.  Continue with Eliquis.  He was placed on 200 mg twice daily of amiodarone.  He will continue on the current dose for 1 month before outpatient cardioversion.  I recommend follow-up in about a month after the cardioversion, if he is able to maintain sinus rhythm, and his blood pressure is stable, would recommend addition of ACE inhibitor or ARB.  Heart rate is still not very well controlled, increase bisoprolol to 5 mg daily  2. CAD s/p CABG: Denies any recent chest pain.  3. Acute on chronic combined systolic and diastolic heart failure: Per daughter, he appears to be volume overloaded, he does have bilateral lower extremity trace amount of edema, and also markedly diminished breath sounds in bilateral bases.  I increased his Lasix to 40 mg twice daily for 2 days before decreasing it down to 60 mg daily thereafter.  He will also increase his potassium supplement to 20 mEq daily.  He will need 1 week basic metabolic panel.  4. Mitral valve regurgitation: Seen on recent echocardiogram, however surprisingly, he does not have significant heart murmur.  I think his mitral regurgitation probably was worse during the acute heart failure stage in the recent hospitalization due  to volume overload.  Likely will appear less significant on the next echocardiogram.    Medication Adjustments/Labs and Tests Ordered: Current medicines are reviewed at length  with the patient today.  Concerns regarding medicines are outlined above.  Medication changes, Labs and Tests ordered today are listed in the Patient Instructions below. Patient Instructions  Medication Instructions: INCREASE the Furosemide to 40 mg twice a day for two days. After the two days take Furosemide 60 mg daily. INCREASE the Bisoprolol to 5 mg daily INCREASE the potassium to 20 meq daily.  If you need a refill on your cardiac medications before your next appointment, please call your pharmacy.   Labwork: Your provider would like for you to return in one week to have the following labs drawn: BMET. You do not need an appointment for the lab. Once in our office lobby there is a podium where you can sign in and ring the doorbell to alert [place] that you are here. The lab is open from 8:00 am to 4:30 pm; closed for lunch from 12:45pm-1:45pm.  Follow-Up: Your physician wants you to keep your follow up with the Atrial Fibrillation clinic on 07/15/17   Thank you for choosing Heartcare at [facility]!!         Signed, [name], [place]  06/28/2017 2:15 PM    [address], [place], [place]  [address] Phone: [phone]; Fax: [phone]

## 2017-06-28 NOTE — Patient Instructions (Addendum)
Medication Instructions: INCREASE the Furosemide to 40 mg twice a day for two days. After the two days take Furosemide 60 mg daily. INCREASE the Bisoprolol to 5 mg daily INCREASE the potassium to 20 meq daily.  If you need a refill on your cardiac medications before your next appointment, please call your pharmacy.   Labwork: Your provider would like for you to return in one week to have the following labs drawn: BMET. You do not need an appointment for the lab. Once in our office lobby there is a podium where you can sign in and ring the doorbell to alert [place] that you are here. The lab is open from 8:00 am to 4:30 pm; closed for lunch from 12:45pm-1:45pm.  Follow-Up: Your physician wants you to keep your follow up with the Atrial Fibrillation clinic on 07/15/17   Thank you for choosing Heartcare at [facility]!!

## 2017-06-29 ENCOUNTER — Other Ambulatory Visit: Payer: Self-pay

## 2017-06-29 NOTE — Patient Outreach (Signed)
[facility]) Care Management  06/29/2017  Fisher Hargadon Shore 20-May-1926 [id number]   EMMI-HF RED ON EMMI ALERT Day # 10 Date: 06/27/17 Red Alert Reason: "Weight: 183 lbs"    Outreach attempt #2 to patient. Spoke with patient. He voices he is doing well. Reviewed and addressed red alert with patient. He voices that his weight did go up slightly a few pounds. He went to MD on yesterday and his diuretic was adjusted. He reports that he is adhering to med regimen. He weighed this morning and weight was back down to 180 lbs. He denies any edema,SOB or other symptoms. He is knowledgeable regarding his condition and aware of when to seek medical attention for changes in condition.       Plan: RN CM will close case at this time as no further interventions needed.   [name], RN,BSN,CCM [place] [facility] Telephonic Care Management Coordinator Direct Phone: [phone] Toll Free: [phone] Fax: [phone]

## 2017-07-01 DIAGNOSIS — S91114D Laceration without foreign body of right lesser toe(s) without damage to nail, subsequent encounter: Secondary | ICD-10-CM | POA: Diagnosis not present

## 2017-07-02 ENCOUNTER — Ambulatory Visit (HOSPITAL_COMMUNITY)
Admission: RE | Admit: 2017-07-02 | Discharge: 2017-07-02 | Disposition: A | Discharge status: Home / Self Care | Payer: Medicare Other | Source: Ambulatory Visit | Attending: Nurse Practitioner | Admitting: Nurse Practitioner

## 2017-07-02 DIAGNOSIS — I481 Persistent atrial fibrillation: Secondary | ICD-10-CM | POA: Insufficient documentation

## 2017-07-02 DIAGNOSIS — I4819 Other persistent atrial fibrillation: Secondary | ICD-10-CM

## 2017-07-02 DIAGNOSIS — J449 Chronic obstructive pulmonary disease, unspecified: Secondary | ICD-10-CM | POA: Diagnosis not present

## 2017-07-02 LAB — PULMONARY FUNCTION TEST
DL/VA % pred: 91 %
DL/VA: 4.1 ml/min/mmHg/L
DLCO UNC % PRED: 59 %
DLCO unc: 18.39 ml/min/mmHg
FEF 25-75 POST: 0.65 L/s
FEF 25-75 PRE: 0.36 L/s
FEF2575-%CHANGE-POST: 78 %
FEF2575-%PRED-PRE: 25 %
FEF2575-%Pred-Post: 45 %
FEV1-%Change-Post: 27 %
FEV1-%PRED-POST: 49 %
FEV1-%Pred-Pre: 38 %
FEV1-Post: 1.17 L
FEV1-Pre: 0.92 L
FEV1FVC-%CHANGE-POST: 0 %
FEV1FVC-%PRED-PRE: 71 %
FEV6-%CHANGE-POST: 22 %
FEV6-%Pred-Post: 67 %
FEV6-%Pred-Pre: 55 %
FEV6-Post: 2.14 L
FEV6-Pre: 1.75 L
FEV6FVC-%Change-Post: -3 %
FEV6FVC-%Pred-Post: 100 %
FEV6FVC-%Pred-Pre: 103 %
FVC-%CHANGE-POST: 26 %
FVC-%PRED-POST: 67 %
FVC-%Pred-Pre: 53 %
FVC-Post: 2.33 L
FVC-Pre: 1.84 L
POST FEV1/FVC RATIO: 50 %
Post FEV6/FVC ratio: 92 %
Pre FEV1/FVC ratio: 50 %
Pre FEV6/FVC Ratio: 95 %
RV % pred: 318 %
RV: 8.89 L
TLC % pred: 162 %
TLC: 11.17 L

## 2017-07-02 MED ORDER — ALBUTEROL SULFATE (2.5 MG/3ML) 0.083% IN NEBU
2.5000 mg | INHALATION_SOLUTION | Freq: Once | RESPIRATORY_TRACT | Status: AC
  Administered 2017-07-02: 2.5 mg via RESPIRATORY_TRACT

## 2017-07-05 DIAGNOSIS — I481 Persistent atrial fibrillation: Secondary | ICD-10-CM | POA: Diagnosis not present

## 2017-07-06 LAB — BASIC METABOLIC PANEL
BUN/Creatinine Ratio: 20 (ref 10–24)
BUN: 32 mg/dL — AB (ref 8–27)
CO2: 24 mmol/L (ref 20–29)
CREATININE: 1.63 mg/dL — AB (ref 0.76–1.27)
Calcium: 9.7 mg/dL (ref 8.6–10.2)
Chloride: 103 mmol/L (ref 96–106)
GFR calc Af Amer: 43 mL/min/{1.73_m2} — ABNORMAL LOW (ref >59)
GFR calc non Af Amer: 37 mL/min/{1.73_m2} — ABNORMAL LOW (ref >59)
GLUCOSE: 108 mg/dL — AB (ref 65–99)
Potassium: 4.6 mmol/L (ref 3.5–5.2)
Sodium: 144 mmol/L (ref 134–144)

## 2017-07-07 ENCOUNTER — Other Ambulatory Visit: Payer: Self-pay

## 2017-07-07 NOTE — Patient Outreach (Signed)
[facility]) Care Management  07/07/2017  Donald [name] 06-Sep-1926 [id number]     EMMI- CHF RED ON EMMI ALERT Day # 19 Date: 07/06/17 Red Alert Reason: "weight? 182 lbs , previous weight? 180 lbs"   Outreach attempt # 1 to patient.  No answer at present.          Plan: RN CM will make outreach attempt to patient within three business days.   [name], RN,BSN,CCM [place] Management Telephonic Care Management Coordinator Direct Phone: [phone] Toll Free: [phone] Fax: [phone]

## 2017-07-08 ENCOUNTER — Encounter (HOSPITAL_COMMUNITY): Payer: Self-pay | Admitting: Nurse Practitioner

## 2017-07-08 ENCOUNTER — Other Ambulatory Visit: Payer: Self-pay

## 2017-07-08 ENCOUNTER — Ambulatory Visit (HOSPITAL_COMMUNITY)
Admission: RE | Admit: 2017-07-08 | Discharge: 2017-07-08 | Disposition: A | Discharge status: Home / Self Care | Payer: Medicare Other | Source: Ambulatory Visit | Attending: Nurse Practitioner | Admitting: Nurse Practitioner

## 2017-07-08 VITALS — BP 102/64 | HR 80 | Ht 69.0 in | Wt 187.4 lb

## 2017-07-08 DIAGNOSIS — I5041 Acute combined systolic (congestive) and diastolic (congestive) heart failure: Secondary | ICD-10-CM | POA: Diagnosis not present

## 2017-07-08 DIAGNOSIS — Z951 Presence of aortocoronary bypass graft: Secondary | ICD-10-CM | POA: Diagnosis not present

## 2017-07-08 DIAGNOSIS — Z8546 Personal history of malignant neoplasm of prostate: Secondary | ICD-10-CM | POA: Diagnosis not present

## 2017-07-08 DIAGNOSIS — I4510 Unspecified right bundle-branch block: Secondary | ICD-10-CM | POA: Insufficient documentation

## 2017-07-08 DIAGNOSIS — I4819 Other persistent atrial fibrillation: Secondary | ICD-10-CM

## 2017-07-08 DIAGNOSIS — Z955 Presence of coronary angioplasty implant and graft: Secondary | ICD-10-CM | POA: Diagnosis not present

## 2017-07-08 DIAGNOSIS — J45909 Unspecified asthma, uncomplicated: Secondary | ICD-10-CM | POA: Diagnosis not present

## 2017-07-08 DIAGNOSIS — Z79899 Other long term (current) drug therapy: Secondary | ICD-10-CM | POA: Diagnosis not present

## 2017-07-08 DIAGNOSIS — H409 Unspecified glaucoma: Secondary | ICD-10-CM | POA: Insufficient documentation

## 2017-07-08 DIAGNOSIS — Z87442 Personal history of urinary calculi: Secondary | ICD-10-CM | POA: Diagnosis not present

## 2017-07-08 DIAGNOSIS — I255 Ischemic cardiomyopathy: Secondary | ICD-10-CM | POA: Insufficient documentation

## 2017-07-08 DIAGNOSIS — I481 Persistent atrial fibrillation: Secondary | ICD-10-CM | POA: Diagnosis not present

## 2017-07-08 DIAGNOSIS — Z8673 Personal history of transient ischemic attack (TIA), and cerebral infarction without residual deficits: Secondary | ICD-10-CM | POA: Insufficient documentation

## 2017-07-08 DIAGNOSIS — Z7901 Long term (current) use of anticoagulants: Secondary | ICD-10-CM | POA: Insufficient documentation

## 2017-07-08 DIAGNOSIS — S91114D Laceration without foreign body of right lesser toe(s) without damage to nail, subsequent encounter: Secondary | ICD-10-CM | POA: Diagnosis not present

## 2017-07-08 DIAGNOSIS — I340 Nonrheumatic mitral (valve) insufficiency: Secondary | ICD-10-CM | POA: Diagnosis not present

## 2017-07-08 DIAGNOSIS — I2510 Atherosclerotic heart disease of native coronary artery without angina pectoris: Secondary | ICD-10-CM | POA: Diagnosis not present

## 2017-07-08 MED ORDER — FUROSEMIDE 40 MG PO TABS: 60.0000 mg | ORAL_TABLET | Freq: Every day | ORAL | 2 refills | Status: DC

## 2017-07-08 NOTE — Patient Outreach (Signed)
[facility]) Care Management  07/08/2017  Donald [name] 30-Mar-1927 [id number]   EMMI- CHF RED ON EMMI ALERT Day # 19 Date: 07/06/17 Red Alert Reason: "weight? 182 lbs , previous weight? 180 lbs"   Outreach attempt #2 to patient. Spoke with patient and reviewed red alert. He voices that his weight today is 183 lbs and up a pound from yesterday. He reports the usual amount of chronic SOB but nothing worse. He states that he has some swelling to his abdominal area. He voices he is adhering to med regimen and low salt diet. He already plans to contact MD office when they open to discuss the issue. Patient states he does not feel like meds are doing what they are supposed to do and wants to talk to MD regarding this. He is aware of worsening s/s and when to seek medical attention. He is knowledgeable regarding his condition. No further RN CM needs or concerns at this time.      Plan: RN CM will close case at this time.    [name], RN,BSN,CCM [place] Management Telephonic Care Management Coordinator Direct Phone: [phone] Toll Free: [phone] Fax: [phone]

## 2017-07-08 NOTE — Patient Instructions (Signed)
Cardioversion scheduled for Thursday, April 25th with Dr. [name]  - Eartha Inch at the [facility] and go to admitting at 12:30pm  -Do not eat or drink anything after midnight the night prior to your procedure.  - Take all your morning medications with a sip of water prior to arrival.  - You will not be able to drive home after your procedure.   Take extra 20mg  of lasix tonight Tomorrow and Saturday Increase lasix to 40mg  twice a day  Monday back to lasix 60mg 

## 2017-07-09 NOTE — Progress Notes (Signed)
Primary Care Physician: [name], [name], MD Referring Physician: Dr. [name] Cardiologist: Dr. Caprice Renshaw is a 84 y.o. male with a h/o asthma, CAD,CVA, that is in the afib clinic today for evulation of shortness of breath. Pt has been suspected of having afib in the past, but has has never been documented. He called  Dr. Jacalyn Lefevre office with c/o of shortness of breath and referred here.  His EKG shows afib at 130 bpm, RBBB, qrs int 136 ms, qtc 503 ms.   For his age he is highly functioning, not aware of the rapid HR, just the shortness of breath. He has been noticing these symptoms for about one month. He is already on eliquis 5 mg bid, chadsvasc score of at least 4. His weight was 180 lbs in a hospital gown when he had lithotripsy 1/31. Today it is 197, fully dressed. He admits to eating more and gaining 30 lbs over the last year. Most of the shortness of breath is with exertion.  No excessive caffeine, no alcohol, tobacco, denies snoring.   F/u in afib clinic after starting bisoprolol 5 mg daily. His HR was better controlled now. He was not sure on last visit if he was persistent or paroxysmal but with his symptoms of exertional shortness of breath  and additional weight gain, I feel he is persistent. He has not missed any doses of eliquis for at least 3 weeks so will plan for cardioversion. Will plan on CXR today and starting diuretics.   F/u afib clinic, 3/28, since I saw him last, pt's shortness of breath worsened and he was admitted  with CHF. He was diuresed and cardioverted. Unfortunately, he is back in afib today, rate controlled. Echo showed EF of 40-45%, left/rt atrium severely dilated, mod to severe regurgitation, TR mod regurgitation. Pt has noted that shortness of breath became more noticeable after 3 days after cardioversion. He states that weight at home is stable, 187 today. Pt's TSH has been elvated but PCP checked the full thyroid panel and other values were  normal  F/u in afib clinic 4/11,he called into office and c/o weight gain and increased shortness of breath. He is currently loading on amiodarone and is 2 weeks away from cardioversion. He picked up 3 lbs of fluid in the last 1-2 days.He is currently  on 60 mg lasix daily.  Today, he denies symptoms of palpitations, chest pain,  orthopnea, PND, lower extremity edema, dizziness, presyncope, syncope, or neurologic sequela.+ for acute on chronic dyspnea. The patient is tolerating medications without difficulties and is otherwise without complaint today.   Past Medical History:  Diagnosis Date  . Asthma    with worsening with beta blockade  . CAD (coronary artery disease)   . CVA (cerebral infarction) 2012   tia, mild no residual defecits  . GI bleed 8 yrs ago   due to doll fully vessel which was clipped  . Glaucoma    excellent cataracts  . History of kidney stones   . Ischemic cardiomyopathy   . Moderate to severe mitral regurgitation 06/16/2017  . Nephrolithiasis   . 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 Prostate cancer ([place]) 2012  . Radiation proctitis Feb 2015   treated with APC  . Tubular adenoma 04/2013   Dr. Hilarie Fredrickson   Past Surgical History:  Procedure Laterality Date  . cardiac stents  2003  . CARDIOVERSION N/A 06/15/2017   Procedure: CARDIOVERSION;  Surgeon: Acie Fredrickson Wonda Cheng, MD;  Location: [facility];  Service: Cardiovascular;  Laterality: N/A;  . COLONOSCOPY WITH PROPOFOL N/A 05/05/2013   Procedure: COLONOSCOPY WITH PROPOFOL;  Surgeon: Jerene Bears, MD;  Location: [facility];  Service: Gastroenterology;  Laterality: N/A;  . CORONARY ARTERY BYPASS GRAFT  1998   LIMA to the LAD and saphenous vein graft tothe diagonal  . EXTRACORPOREAL SHOCK WAVE LITHOTRIPSY Right 04/29/2017   Procedure: RIGHT EXTRACORPOREAL SHOCK WAVE LITHOTRIPSY (ESWL);  Surgeon: Alexis Frock, MD;  Location: [facility];  Service: Urology;  Laterality: Right;  . history of radiation treatment  2012   x  40 treatments done  . KNEE ARTHROSCOPY  2016 or 2017   Dr Gladstone Lighter ;   . PARTIAL KNEE ARTHROPLASTY Right 01/13/2017   Procedure: UNICOMPARTMENTAL RIGHT KNEE;  Surgeon: Gaynelle Arabian, MD;  Location: [facility];  Service: Orthopedics;  Laterality: Right;  with block    Current Outpatient Medications  Medication Sig Dispense Refill  . albuterol (PROVENTIL HFA;VENTOLIN HFA) 108 (90 Base) MCG/ACT inhaler Inhale 1 puff into the lungs every 6 (six) hours as needed for wheezing or shortness of breath.    Marland Kitchen amiodarone (PACERONE) 200 MG tablet Take 1 tablet (200 mg total) by mouth 2 (two) times daily. 60 tablet 0  . apixaban (ELIQUIS) 5 MG TABS tablet Take 5 mg by mouth 2 (two) times daily.    . bisoprolol (ZEBETA) 5 MG tablet Take 1 tablet (5 mg total) by mouth daily. 30 tablet 6  . Cholecalciferol (VITAMIN D) 2000 UNITS tablet Take 2,000 Units by mouth daily.    . fenofibrate micronized (LOFIBRA) 200 MG capsule Take 1 capsule (200 mg total) by mouth daily. 90 capsule 3  . Fluticasone Furoate-Vilanterol (BREO ELLIPTA) 100-25 MCG/INH AEPB Inhale 1 puff into the lungs daily as needed (shortness of breath).     . furosemide (LASIX) 40 MG tablet Take 1.5 tablets (60 mg total) by mouth daily. 135 tablet 2  . hydrocortisone cream 1 % Apply 1 application topically 3 (three) times daily as needed for itching (skin irritation). 30 g 0  . latanoprost (XALATAN) 0.005 % ophthalmic solution Place 1 drop into the left eye at bedtime.     Marland Kitchen loratadine (CLARITIN) 10 MG tablet Take 10 mg by mouth daily.    . Multiple Vitamin (MULTIVITAMIN WITH MINERALS) TABS tablet Take 1 tablet by mouth daily.    . potassium chloride 20 MEQ TBCR Take 20 mEq by mouth daily. 90 tablet 1  . pravastatin (PRAVACHOL) 40 MG tablet Take 1 tablet (40 mg total) by mouth at bedtime. 90 tablet 4  . Tamsulosin HCl (FLOMAX) 0.4 MG CAPS Take 0.4 mg by mouth every evening.      No current facility-administered medications for this encounter.      Allergies  Allergen Reactions  . Keflex [Cephalexin] Nausea And Vomiting and Other (See Comments)    'Killed all GI enzymes" (also) and patient prefers to not take this  . Morphine Nausea Only    Social History   Socioeconomic History  . Marital status: Widowed    Spouse name: Not on file  . Number of children: 3  . Years of education: Not on file  . Highest education level: Not on file  Occupational History  . Occupation: Retired Conservation officer, nature  Social Needs  . Financial resource strain: Not on file  . Food insecurity:    Worry: Not on file    Inability: Not on file  . Transportation needs:    Medical: Not  on file    Non-medical: Not on file  Tobacco Use  . Smoking status: Never Smoker  . Smokeless tobacco: Never Used  Substance and Sexual Activity  . Alcohol use: Yes    Alcohol/week: 0.0 oz    Comment: occasional beer or wine  . Drug use: No  . Sexual activity: Not on file  Lifestyle  . Physical activity:    Days per week: Not on file    Minutes per session: Not on file  . Stress: Not on file  Relationships  . Social connections:    Talks on phone: Not on file    Gets together: Not on file    Attends religious service: Not on file    Active member of club or organization: Not on file    Attends meetings of clubs or organizations: Not on file    Relationship status: Not on file  . Intimate partner violence:    Fear of current or ex partner: Not on file    Emotionally abused: Not on file    Physically abused: Not on file    Forced sexual activity: Not on file  Other Topics Concern  . Not on file  Social History Narrative  . Not on file    Family History  Problem Relation Age of Onset  . Heart attack Father   . Heart disease Mother   . Stomach cancer Mother     ROS- All systems are reviewed and negative except as per the HPI above  Physical Exam: Vitals:   07/08/17 1434  BP: 102/64  Pulse: 80  Weight: 187 lb 6.4 oz (85 kg)  Height: 5'  9" (1.753 m)   Wt Readings from Last 3 Encounters:  07/08/17 187 lb 6.4 oz (85 kg)  06/28/17 188 lb 9.6 oz (85.5 kg)  06/24/17 187 lb (84.8 kg)    Labs: Lab Results  Component Value Date   NA 144 07/05/2017   K 4.6 07/05/2017   CL 103 07/05/2017   CO2 24 07/05/2017   GLUCOSE 108 (H) 07/05/2017   BUN 32 (H) 07/05/2017   CREATININE 1.63 (H) 07/05/2017   CALCIUM 9.7 07/05/2017   MG 2.0 06/24/2017   Lab Results  Component Value Date   INR 1.59 06/15/2017   Lab Results  Component Value Date   CHOL 114 11/30/2012   HDL 47.00 11/30/2012   LDLCALC 56 11/30/2012   TRIG 57.0 11/30/2012     GEN- The patient is elderly appearing, alert and oriented x 3 today.   Head- normocephalic, atraumatic Eyes-  Sclera clear, conjunctiva pink Ears- hearing intact Oropharynx- clear Neck- supple, no JVP Lymph- no cervical lymphadenopathy Lungs- Clear to ausculation bilaterally, normal work of breathing Heart-  irregular rate and rhythm, no murmurs, rubs or gallops, PMI not laterally displaced GI- soft, NT, ND, + BS Extremities- no clubbing, cyanosis, or trace edema MS- no significant deformity or atrophy Skin- no rash or lesion Psych- euthymic mood, full affect Neuro- strength and sensation are intact  EKG-afib at 80 bpm, RBBB, LAFB, Bifascicular block Epic records reviewed  Echo-Study Conclusions  - Left ventricle: The cavity size was normal. Wall thickness was   increased in a pattern of mild LVH. Systolic function was mildly   to moderately reduced. The estimated ejection fraction was in the   range of 40% to 45%. Akinesis of the apical myocardium. - Ventricular septum: The contour showed systolic flattening. These   changes are consistent with RV volume and pressure overload. -  Aortic valve: Trileaflet; normal thickness, mildly calcified   leaflets. Transvalvular velocity was increased. There was mild   stenosis. Valve area (VTI): 1.28 cm^2. Valve area (Vmax): 1.35   cm^2.  Valve area (Vmean): 1.21 cm^2. - Aortic root: The aortic root was mildly dilated. - Mitral valve: Mildly calcified annulus. There was moderate to   severe regurgitation. - Left atrium: The atrium was severely dilated. - Right ventricle: The cavity size was dilated. Wall thickness was   normal. - Right atrium: The atrium was severely dilated. - Tricuspid valve: There was moderate regurgitation. - Pulmonary arteries: Systolic pressure could not be accurately   estimated. Suspect it is higher because of other characteristics.   PA peak pressure: 33 mm Hg (S).    Assessment and Plan: 1.  Persistent afib Cardioversion initially successful but ERAF Left atrium severely enlarged so may be difficult to restore and maintain SR Loading on  amiodarone 200 mg bid  2 weeks away from cardioversion, scheduled with Dr. [name] on 4/25 Continue  bisoprolol 5 mg to 1/2 tab with the addition of amiodarone  2. Chadsvasc score of at least 4 Continue eliquis 5 mg bid States no missed doses   3.  Acute combined sys/diastolic HF Weight up 3 lbs Increase lasix to 40 mg bid x2 days and then return to 60 mg daily  4. Asthma He states stable usually does not have to use inhaler  F/u Tuesday F/u with Dr. [name] Marcina Millard  Geroge Baseman. Natalyah Cummiskey, [facility] [address] [place], [place] [address] [phone]

## 2017-07-12 ENCOUNTER — Telehealth: Payer: Self-pay

## 2017-07-12 ENCOUNTER — Other Ambulatory Visit: Payer: Self-pay

## 2017-07-12 DIAGNOSIS — I5041 Acute combined systolic (congestive) and diastolic (congestive) heart failure: Secondary | ICD-10-CM

## 2017-07-12 NOTE — Patient Outreach (Signed)
[facility]) Care Management  07/12/2017  Donald [name] April 03, 1926 [id number]     EMMI-HF RED ON EMMI ALERT Day # 24 Date: 07/11/17 Red Alert Reason: "Weight? 184 lbs"   Outreach attempt # 1 to patient. Spoke with patient. Reviewed and addressed red alert. Patient very knowledgeable regarding his condition and how to manage it. He voices that he called MD office first thing this morning to alert them that his weight was increasing. He spoke with PA who is going to check with MD and call him back regarding recommendations. He voces that his Lasix was recently increased but he does not feel like it is working as his weight has not went down since dosage changed. He is aware of worsening s/s and when to seek medical attention. He is able to manage his condition and voices no RN CM needs or concerns at this time.        Plan: RN CM will close case as no further interventions needed at this time.  [name], RN,BSN,CCM [facility] Telephonic Care Management Coordinator Direct Phone: [phone] Toll Free: [phone] Fax: [phone]

## 2017-07-12 NOTE — Telephone Encounter (Signed)
Spoke with patient to give lab results, patient stated his lasix was increased by  [facility]); but he woke up this morning and has gained fluid. He stated he contacted Roderic Palau and she said she would give him a call back after she spoke with the pharmacist. He stated he will inform Isaac Laud once he receives a call back. I will inform Isaac Laud and wait to hear back.

## 2017-07-13 ENCOUNTER — Other Ambulatory Visit: Payer: Self-pay

## 2017-07-13 ENCOUNTER — Encounter (HOSPITAL_COMMUNITY): Payer: Self-pay | Admitting: Nurse Practitioner

## 2017-07-13 ENCOUNTER — Ambulatory Visit (HOSPITAL_BASED_OUTPATIENT_CLINIC_OR_DEPARTMENT_OTHER)
Admission: RE | Admit: 2017-07-13 | Discharge: 2017-07-13 | Disposition: A | Discharge status: Home / Self Care | Payer: Medicare Other | Source: Ambulatory Visit | Attending: Nurse Practitioner | Admitting: Nurse Practitioner

## 2017-07-13 ENCOUNTER — Inpatient Hospital Stay (HOSPITAL_COMMUNITY)
Admission: AD | Admit: 2017-07-13 | Discharge: 2017-07-22 | DRG: 308 | Disposition: A | Discharge status: Home / Self Care | Payer: Medicare Other | Source: Ambulatory Visit | Attending: Cardiology | Admitting: Cardiology

## 2017-07-13 VITALS — BP 118/74 | HR 86 | Ht 69.0 in | Wt 191.6 lb

## 2017-07-13 DIAGNOSIS — H409 Unspecified glaucoma: Secondary | ICD-10-CM | POA: Diagnosis present

## 2017-07-13 DIAGNOSIS — Z96651 Presence of right artificial knee joint: Secondary | ICD-10-CM | POA: Insufficient documentation

## 2017-07-13 DIAGNOSIS — Z8546 Personal history of malignant neoplasm of prostate: Secondary | ICD-10-CM

## 2017-07-13 DIAGNOSIS — Z800 Family history of malignant neoplasm of digestive organs: Secondary | ICD-10-CM

## 2017-07-13 DIAGNOSIS — I255 Ischemic cardiomyopathy: Secondary | ICD-10-CM | POA: Diagnosis not present

## 2017-07-13 DIAGNOSIS — I481 Persistent atrial fibrillation: Principal | ICD-10-CM | POA: Diagnosis present

## 2017-07-13 DIAGNOSIS — Z7901 Long term (current) use of anticoagulants: Secondary | ICD-10-CM

## 2017-07-13 DIAGNOSIS — N183 Chronic kidney disease, stage 3 (moderate): Secondary | ICD-10-CM | POA: Diagnosis present

## 2017-07-13 DIAGNOSIS — R0602 Shortness of breath: Secondary | ICD-10-CM

## 2017-07-13 DIAGNOSIS — I5043 Acute on chronic combined systolic (congestive) and diastolic (congestive) heart failure: Secondary | ICD-10-CM | POA: Diagnosis not present

## 2017-07-13 DIAGNOSIS — N171 Acute kidney failure with acute cortical necrosis: Secondary | ICD-10-CM | POA: Diagnosis not present

## 2017-07-13 DIAGNOSIS — J45909 Unspecified asthma, uncomplicated: Secondary | ICD-10-CM | POA: Diagnosis not present

## 2017-07-13 DIAGNOSIS — Z9289 Personal history of other medical treatment: Secondary | ICD-10-CM

## 2017-07-13 DIAGNOSIS — Z9889 Other specified postprocedural states: Secondary | ICD-10-CM

## 2017-07-13 DIAGNOSIS — Z8673 Personal history of transient ischemic attack (TIA), and cerebral infarction without residual deficits: Secondary | ICD-10-CM

## 2017-07-13 DIAGNOSIS — I2510 Atherosclerotic heart disease of native coronary artery without angina pectoris: Secondary | ICD-10-CM

## 2017-07-13 DIAGNOSIS — Z951 Presence of aortocoronary bypass graft: Secondary | ICD-10-CM | POA: Insufficient documentation

## 2017-07-13 DIAGNOSIS — I509 Heart failure, unspecified: Secondary | ICD-10-CM | POA: Diagnosis not present

## 2017-07-13 DIAGNOSIS — I959 Hypotension, unspecified: Secondary | ICD-10-CM | POA: Diagnosis not present

## 2017-07-13 DIAGNOSIS — Z79899 Other long term (current) drug therapy: Secondary | ICD-10-CM | POA: Insufficient documentation

## 2017-07-13 DIAGNOSIS — Z923 Personal history of irradiation: Secondary | ICD-10-CM

## 2017-07-13 DIAGNOSIS — Z8249 Family history of ischemic heart disease and other diseases of the circulatory system: Secondary | ICD-10-CM

## 2017-07-13 DIAGNOSIS — I480 Paroxysmal atrial fibrillation: Secondary | ICD-10-CM | POA: Diagnosis not present

## 2017-07-13 DIAGNOSIS — Z885 Allergy status to narcotic agent status: Secondary | ICD-10-CM

## 2017-07-13 DIAGNOSIS — Z87442 Personal history of urinary calculi: Secondary | ICD-10-CM | POA: Insufficient documentation

## 2017-07-13 DIAGNOSIS — I5041 Acute combined systolic (congestive) and diastolic (congestive) heart failure: Secondary | ICD-10-CM

## 2017-07-13 DIAGNOSIS — I5022 Chronic systolic (congestive) heart failure: Secondary | ICD-10-CM | POA: Diagnosis present

## 2017-07-13 DIAGNOSIS — Z955 Presence of coronary angioplasty implant and graft: Secondary | ICD-10-CM

## 2017-07-13 DIAGNOSIS — E785 Hyperlipidemia, unspecified: Secondary | ICD-10-CM | POA: Diagnosis present

## 2017-07-13 DIAGNOSIS — I5023 Acute on chronic systolic (congestive) heart failure: Secondary | ICD-10-CM | POA: Diagnosis not present

## 2017-07-13 DIAGNOSIS — I4819 Other persistent atrial fibrillation: Secondary | ICD-10-CM

## 2017-07-13 DIAGNOSIS — N184 Chronic kidney disease, stage 4 (severe): Secondary | ICD-10-CM | POA: Diagnosis not present

## 2017-07-13 DIAGNOSIS — Z881 Allergy status to other antibiotic agents status: Secondary | ICD-10-CM | POA: Insufficient documentation

## 2017-07-13 DIAGNOSIS — Z950 Presence of cardiac pacemaker: Secondary | ICD-10-CM | POA: Diagnosis not present

## 2017-07-13 DIAGNOSIS — N2889 Other specified disorders of kidney and ureter: Secondary | ICD-10-CM | POA: Diagnosis not present

## 2017-07-13 DIAGNOSIS — I25810 Atherosclerosis of coronary artery bypass graft(s) without angina pectoris: Secondary | ICD-10-CM | POA: Diagnosis not present

## 2017-07-13 DIAGNOSIS — N179 Acute kidney failure, unspecified: Secondary | ICD-10-CM | POA: Diagnosis present

## 2017-07-13 DIAGNOSIS — I4891 Unspecified atrial fibrillation: Secondary | ICD-10-CM | POA: Diagnosis not present

## 2017-07-13 LAB — CBC
HCT: 39.5 % (ref 39.0–52.0)
Hemoglobin: 13 g/dL (ref 13.0–17.0)
MCH: 30.9 pg (ref 26.0–34.0)
MCHC: 32.9 g/dL (ref 30.0–36.0)
MCV: 93.8 fL (ref 78.0–100.0)
PLATELETS: 180 10*3/uL (ref 150–400)
RBC: 4.21 MIL/uL — AB (ref 4.22–5.81)
RDW: 15.3 % (ref 11.5–15.5)
WBC: 4.6 10*3/uL (ref 4.0–10.5)

## 2017-07-13 LAB — BASIC METABOLIC PANEL
Anion gap: 8 (ref 5–15)
BUN: 39 mg/dL — AB (ref 6–20)
CALCIUM: 9.3 mg/dL (ref 8.9–10.3)
CO2: 28 mmol/L (ref 22–32)
CREATININE: 2.02 mg/dL — AB (ref 0.61–1.24)
Chloride: 104 mmol/L (ref 101–111)
GFR, EST AFRICAN AMERICAN: 32 mL/min — AB (ref >60)
GFR, EST NON AFRICAN AMERICAN: 28 mL/min — AB (ref >60)
Glucose, Bld: 180 mg/dL — ABNORMAL HIGH (ref 65–99)
Potassium: 4.8 mmol/L (ref 3.5–5.1)
SODIUM: 140 mmol/L (ref 135–145)

## 2017-07-13 MED ORDER — BISOPROLOL FUMARATE 5 MG PO TABS: 5.0000 mg | ORAL_TABLET | Freq: Every day | ORAL | Status: DC

## 2017-07-13 MED ORDER — LATANOPROST 0.005 % OP SOLN
1.0000 [drp] | Freq: Every day | OPHTHALMIC | Status: DC
  Administered 2017-07-13 – 2017-07-21 (×9): 1 [drp] via OPHTHALMIC
  Filled 2017-07-13 (×2): qty 2.5

## 2017-07-13 MED ORDER — FLUTICASONE FUROATE-VILANTEROL 100-25 MCG/INH IN AEPB
1.0000 | INHALATION_SPRAY | Freq: Every day | RESPIRATORY_TRACT | Status: DC | PRN
  Administered 2017-07-15 – 2017-07-18 (×4): 1 via RESPIRATORY_TRACT
  Filled 2017-07-13 (×2): qty 28

## 2017-07-13 MED ORDER — ALBUTEROL SULFATE (2.5 MG/3ML) 0.083% IN NEBU
3.0000 mL | INHALATION_SOLUTION | Freq: Four times a day (QID) | RESPIRATORY_TRACT | Status: DC | PRN
  Filled 2017-07-13: qty 3

## 2017-07-13 MED ORDER — ACETAMINOPHEN 325 MG PO TABS: 650.0000 mg | ORAL_TABLET | ORAL | Status: DC | PRN

## 2017-07-13 MED ORDER — PRAVASTATIN SODIUM 40 MG PO TABS
40.0000 mg | ORAL_TABLET | Freq: Every day | ORAL | Status: DC
Start: 2017-07-13 — End: 2017-07-16
  Administered 2017-07-13 – 2017-07-15 (×3): 40 mg via ORAL
  Filled 2017-07-13 (×3): qty 1

## 2017-07-13 MED ORDER — VITAMIN D 1000 UNITS PO TABS
2000.0000 [IU] | ORAL_TABLET | Freq: Every day | ORAL | Status: DC
  Administered 2017-07-14 – 2017-07-22 (×9): 2000 [IU] via ORAL
  Filled 2017-07-13 (×11): qty 2

## 2017-07-13 MED ORDER — POTASSIUM CHLORIDE CRYS ER 10 MEQ PO TBCR
20.0000 meq | EXTENDED_RELEASE_TABLET | Freq: Every day | ORAL | Status: DC
  Administered 2017-07-13 – 2017-07-22 (×10): 20 meq via ORAL
  Filled 2017-07-13 (×13): qty 2

## 2017-07-13 MED ORDER — SODIUM CHLORIDE 0.9 % IV SOLN
250.0000 mL | INTRAVENOUS | Status: DC | PRN
Start: 2017-07-13 — End: 2017-07-22

## 2017-07-13 MED ORDER — FUROSEMIDE 10 MG/ML IJ SOLN
40.0000 mg | Freq: Once | INTRAMUSCULAR | Status: AC
  Administered 2017-07-13: 40 mg via INTRAVENOUS
  Filled 2017-07-13: qty 4

## 2017-07-13 MED ORDER — APIXABAN 2.5 MG PO TABS
2.5000 mg | ORAL_TABLET | Freq: Two times a day (BID) | ORAL | Status: DC
Start: 2017-07-13 — End: 2017-07-22
  Administered 2017-07-13 – 2017-07-22 (×18): 2.5 mg via ORAL
  Filled 2017-07-13 (×18): qty 1

## 2017-07-13 MED ORDER — SODIUM CHLORIDE 0.9% FLUSH
3.0000 mL | Freq: Two times a day (BID) | INTRAVENOUS | Status: DC
  Administered 2017-07-14 – 2017-07-22 (×10): 3 mL via INTRAVENOUS

## 2017-07-13 MED ORDER — BISOPROLOL FUMARATE 5 MG PO TABS
5.0000 mg | ORAL_TABLET | Freq: Every day | ORAL | Status: DC
  Administered 2017-07-14: 5 mg via ORAL
  Filled 2017-07-13 (×5): qty 1

## 2017-07-13 MED ORDER — AMIODARONE HCL 200 MG PO TABS
200.0000 mg | ORAL_TABLET | Freq: Two times a day (BID) | ORAL | Status: DC
  Administered 2017-07-13 – 2017-07-17 (×7): 200 mg via ORAL
  Filled 2017-07-13 (×8): qty 1

## 2017-07-13 MED ORDER — TAMSULOSIN HCL 0.4 MG PO CAPS
0.4000 mg | ORAL_CAPSULE | Freq: Every evening | ORAL | Status: DC
  Administered 2017-07-13 – 2017-07-21 (×9): 0.4 mg via ORAL
  Filled 2017-07-13 (×9): qty 1

## 2017-07-13 MED ORDER — FENOFIBRATE 160 MG PO TABS
160.0000 mg | ORAL_TABLET | Freq: Every day | ORAL | Status: DC
  Administered 2017-07-14: 160 mg via ORAL
  Filled 2017-07-13 (×3): qty 1

## 2017-07-13 MED ORDER — SODIUM CHLORIDE 0.9% FLUSH: 3.0000 mL | INTRAVENOUS | Status: DC | PRN

## 2017-07-13 NOTE — Patient Instructions (Signed)
Cooking With Less Salt Cooking with less salt is one way to reduce the amount of sodium you get from food. Depending on your condition and overall health, your health care provider or diet and nutrition specialist (dietitian) may recommend that you reduce your sodium intake. Most people should have less than 2,300 milligrams (mg) of sodium each day. If you have high blood pressure (hypertension), you may need to limit your sodium to 1,500 mg each day. Follow the tips below to help reduce your sodium intake. What do I need to know about cooking with less salt? Shopping  Buy sodium-free or low-sodium products. Look for the following words on food labels: ? Low-sodium. ? Sodium-free. ? Reduced-sodium. ? No salt added. ? Unsalted.  Buy fresh or frozen vegetables. Avoid canned vegetables.  Avoid buying meats or protein foods that have been injected with broth or saline solution.  Avoid cured or smoked meats, such as hot dogs, bacon, salami, ham, and bologna. Reading food labels  Check the food label before buying or using packaged ingredients.  Look for products with no more than 140 mg of sodium in one serving.  Do not choose foods with salt as one of the first three ingredients on the ingredients list. If salt is one of the first three ingredients, it usually means the item is high in sodium, because ingredients are listed in order of amount in the food item. Cooking  Use herbs, seasonings without salt, and spices as substitutes for salt in foods.  Use sodium-free baking soda when baking.  Grill, braise, or roast foods to add flavor with less salt.  Avoid adding salt to pasta, rice, or hot cereals while cooking.  Drain and rinse canned vegetables before use.  Avoid adding salt when cooking sweets and desserts.  Cook with low-sodium ingredients. What are some salt alternatives? The following are herbs, seasonings, and spices that can be used instead of salt to give taste to your  food. Herbs should be fresh or dried. Do not choose packaged mixes. Next to the name of the herb, spice, or seasoning are some examples of foods you can pair it with. Herbs  Bay leaves - Soups, meat and vegetable dishes, and spaghetti sauce.  Basil - [employer], soups, pasta, and fish dishes.  Cilantro - Meat, poultry, and vegetable dishes.  Chili powder - Marinades and Mexican dishes.  Chives - Salad dressings and potato dishes.  Cumin - Mexican dishes, couscous, and meat dishes.  Dill - Fish dishes, sauces, and salads.  Fennel - Meat and vegetable dishes, breads, and cookies.  Garlic (do not use garlic salt) - [place] dishes, meat dishes, salad dressings, and sauces.  Marjoram - Soups, potato dishes, and meat dishes.  Oregano - Pizza and spaghetti sauce.  Parsley - Salads, soups, pasta, and meat dishes.  Rosemary - [place] dishes, salad dressings, soups, and red meats.  Saffron - Fish dishes, pasta, and some poultry dishes.  Sage - Stuffings and sauces.  Tarragon - Fish and [employer].  Thyme - Stuffing, meat, and fish dishes. Seasonings  Lemon juice - Fish dishes, poultry dishes, vegetables, and salads.  Vinegar - Salad dressings, vegetables, and fish dishes. Spices  Cinnamon - Sweet dishes, such as cakes, cookies, and puddings.  Cloves - Gingerbread, puddings, and marinades for meats.  Curry - Vegetable dishes, fish and poultry dishes, and stir-fry dishes.  Ginger - Vegetables dishes, fish dishes, and stir-fry dishes.  Nutmeg - Pasta, vegetables, poultry, fish dishes, and custard. What  dishes, such as cakes, cookies, and puddings.   Cloves - Gingerbread, puddings, and marinades for meats.   Curry - Vegetable dishes, fish and poultry dishes, and stir-fry dishes.   Ginger - Vegetables dishes, fish dishes, and stir-fry dishes.   Nutmeg - Pasta, vegetables, poultry, fish dishes, and custard.  What are some low-sodium ingredients and foods?   Fresh or frozen fruits and vegetables with no sauce added.   Fresh or frozen whole meats, poultry, and fish with no sauce added.   Eggs.   Noodles, pasta, quinoa, rice.   Shredded or puffed wheat or puffed rice.   Regular or quick oats.   Milk, yogurt, hard cheeses, and low-sodium cheeses. Good cheese choices include  Swiss, Monterey Jack, and mozzarella. Always check the label for the serving size and sodium content.   Unsalted butter or margarine.   Unsalted nuts.   Sherbet or ice cream (keep to  cup per serving).   Homemade pudding.   Sodium-free baking soda and baking powder.  This is not a complete list of low-sodium ingredients and foods. Contact your dietitian for more options.  Summary   Cooking with less salt is one way to reduce the amount of sodium that you get from food.   Buy sodium-free or low-sodium products.   Check the food label before using or buying packaged ingredients.   Use herbs, seasonings without salt, and spices as substitutes for salt in foods.  This information is not intended to replace advice given to you by your health care provider. Make sure you discuss any questions you have with your health care provider.  Document Released: 03/16/2005 Document Revised: 03/24/2016 Document Reviewed: 03/24/2016  Elsevier Interactive Patient Education  2017 [employer] Inc[employer]

## 2017-07-13 NOTE — Patient Outreach (Signed)
[facility]) Care Management  07/13/2017  Donald [name] [birth year] [id number]     EMMI-HF RED ON EMMI ALERT Day # 25 Date: 07/12/17 Red Alert Reason:  "Weight? 185 lbs  New/worrsening problems? Yes  New/worsening SOb? Yes"     Red on EMMI dashboard received. No outreach call warranted to patient at this time. RN CM addressed issue on previous call. Patient had already contacted MD office for med adjustments.      Plan: RN CM will close case at this time.    [name], RN,BSN,CCM [facility] Telephonic Care Management Coordinator Direct Phone: [phone] Toll Free: [phone] Fax: [phone]

## 2017-07-13 NOTE — Progress Notes (Signed)
Primary Care Physician: [name], [name], MD Referring Physician: Dr. [name] Cardiologist: Dr. Caprice Renshaw is a 84 y.o. male with a h/o asthma, CAD,CVA, that is in the afib clinic today for evulation of shortness of breath. Pt has been suspected of having afib in the past, but has has never been documented. He called  Dr. Jacalyn Lefevre office with c/o of shortness of breath and referred here.  His EKG shows afib at 130 bpm, RBBB, qrs int 136 ms, qtc 503 ms.   For his age he is highly functioning, not aware of the rapid HR, just the shortness of breath. He has been noticing these symptoms for about one month. He is already on eliquis 5 mg bid, chadsvasc score of at least 4. His weight was 180 lbs in a hospital gown when he had lithotripsy 1/31. Today it is 197, fully dressed. He admits to eating more and gaining 30 lbs over the last year. Most of the shortness of breath is with exertion.  No excessive caffeine, no alcohol, tobacco, denies snoring.   F/u in afib clinic after starting bisoprolol 5 mg daily. His HR was better controlled now. He was not sure on last visit if he was persistent or paroxysmal but with his symptoms of exertional shortness of breath  and additional weight gain, I feel he is persistent. He has not missed any doses of eliquis for at least 3 weeks so will plan for cardioversion. Will plan on CXR today and starting diuretics.   F/u afib clinic, 3/28, since I saw him last, pt's shortness of breath worsened and he was admitted  with CHF. He was diuresed and cardioverted. Unfortunately, he is back in afib today, rate controlled. Echo showed EF of 40-45%, left/rt atrium severely dilated, mod to severe regurgitation, TR mod regurgitation. Pt has noted that shortness of breath became more noticeable after 3 days after cardioversion. He states that weight at home is stable, 187 today. Pt's TSH has been elevated but PCP checked the full thyroid panel and other values were  normal  F/u in afib clinic 4/11,he called into office and c/o weight gain and increased shortness of breath. He is currently loading on amiodarone and is 2 weeks away from cardioversion. He picked up 3 lbs of fluid in the last 1-2 days.He is currently  on 60 mg lasix daily.  F/u in afib clinic, 4/16, despite increasing lasix to 40 mg bid for several days, he returns to afib clinic up 4-6 lbs in the last week. He feels miserable with increase in abdominal girth, shortness of breath. Appetite deminished. His last creatinine  was  at 1.6. Repeat bmet today shows creatinine of 2.0.   Today, he denies symptoms of palpitations, chest pain,  orthopnea, PND, lower extremity edema, dizziness, presyncope, syncope, or neurologic sequela.+ for acute on chronic dyspnea, increase weight  gain, abdominal girth. The patient is tolerating medications without difficulties and is otherwise without complaint today.   Past Medical History:  Diagnosis Date  . Asthma    with worsening with beta blockade  . CAD (coronary artery disease)   . CVA (cerebral infarction) 2012   tia, mild no residual defecits  . GI bleed 8 yrs ago   due to doll fully vessel which was clipped  . Glaucoma    excellent cataracts  . History of kidney stones   . Ischemic cardiomyopathy   . Moderate to severe mitral regurgitation 06/16/2017  . Nephrolithiasis   .  Post-infarction apical thrombus ([place])   . Prostate cancer ([place]) 2012  . Radiation proctitis Feb 2015   treated with APC  . Tubular adenoma 04/2013   Dr. Hilarie Fredrickson   Past Surgical History:  Procedure Laterality Date  . cardiac stents  2003  . CARDIOVERSION N/A 06/15/2017   Procedure: CARDIOVERSION;  Surgeon: Acie Fredrickson Wonda Cheng, MD;  Location: [facility] ENDOSCOPY;  Service: Cardiovascular;  Laterality: N/A;  . COLONOSCOPY WITH PROPOFOL N/A 05/05/2013   Procedure: COLONOSCOPY WITH PROPOFOL;  Surgeon: Jerene Bears, MD;  Location: [facility] ENDOSCOPY;  Service: Gastroenterology;  Laterality: N/A;  .  CORONARY ARTERY BYPASS GRAFT  1998   LIMA to the LAD and saphenous vein graft tothe diagonal  . EXTRACORPOREAL SHOCK WAVE LITHOTRIPSY Right 04/29/2017   Procedure: RIGHT EXTRACORPOREAL SHOCK WAVE LITHOTRIPSY (ESWL);  Surgeon: Alexis Frock, MD;  Location: [facility] ORS;  Service: Urology;  Laterality: Right;  . history of radiation treatment  2012   x 40 treatments done  . KNEE ARTHROSCOPY  2016 or 2017   Dr Gladstone Lighter ;   . PARTIAL KNEE ARTHROPLASTY Right 01/13/2017   Procedure: UNICOMPARTMENTAL RIGHT KNEE;  Surgeon: Gaynelle Arabian, MD;  Location: [facility] ORS;  Service: Orthopedics;  Laterality: Right;  with block    Current Outpatient Medications  Medication Sig Dispense Refill  . albuterol (PROVENTIL HFA;VENTOLIN HFA) 108 (90 Base) MCG/ACT inhaler Inhale 1 puff into the lungs every 6 (six) hours as needed for wheezing or shortness of breath.    Marland Kitchen amiodarone (PACERONE) 200 MG tablet Take 1 tablet (200 mg total) by mouth 2 (two) times daily. 60 tablet 0  . apixaban (ELIQUIS) 5 MG TABS tablet Take 5 mg by mouth 2 (two) times daily.    . bisoprolol (ZEBETA) 5 MG tablet Take 1 tablet (5 mg total) by mouth daily. 30 tablet 6  . Cholecalciferol (VITAMIN D) 2000 UNITS tablet Take 2,000 Units by mouth daily.    . fenofibrate micronized (LOFIBRA) 200 MG capsule Take 1 capsule (200 mg total) by mouth daily. 90 capsule 3  . Fluticasone Furoate-Vilanterol (BREO ELLIPTA) 100-25 MCG/INH AEPB Inhale 1 puff into the lungs daily as needed (shortness of breath).     . furosemide (LASIX) 40 MG tablet Take 1.5 tablets (60 mg total) by mouth daily. 135 tablet 2  . hydrocortisone cream 1 % Apply 1 application topically 3 (three) times daily as needed for itching (skin irritation). 30 g 0  . latanoprost (XALATAN) 0.005 % ophthalmic solution Place 1 drop into the left eye at bedtime.     Marland Kitchen loratadine (CLARITIN) 10 MG tablet Take 10 mg by mouth daily.    . Multiple Vitamin (MULTIVITAMIN WITH MINERALS) TABS tablet Take 1 tablet  by mouth daily.    . potassium chloride 20 MEQ TBCR Take 20 mEq by mouth daily. 90 tablet 1  . pravastatin (PRAVACHOL) 40 MG tablet Take 1 tablet (40 mg total) by mouth at bedtime. 90 tablet 4  . Tamsulosin HCl (FLOMAX) 0.4 MG CAPS Take 0.4 mg by mouth every evening.      No current facility-administered medications for this encounter.     Allergies  Allergen Reactions  . Keflex [Cephalexin] Nausea And Vomiting and Other (See Comments)    'Killed all GI enzymes" (also) and patient prefers to not take this  . Morphine Nausea Only    Social History   Socioeconomic History  . Marital status: Widowed    Spouse name: Not on file  . Number of children: 3  .  Years of education: Not on file  . Highest education level: Not on file  Occupational History  . Occupation: Retired Conservation officer, nature  Social Needs  . Financial resource strain: Not on file  . Food insecurity:    Worry: Not on file    Inability: Not on file  . Transportation needs:    Medical: Not on file    Non-medical: Not on file  Tobacco Use  . Smoking status: Never Smoker  . Smokeless tobacco: Never Used  Substance and Sexual Activity  . Alcohol use: Yes    Alcohol/week: 0.0 oz    Comment: occasional beer or wine  . Drug use: No  . Sexual activity: Not on file  Lifestyle  . Physical activity:    Days per week: Not on file    Minutes per session: Not on file  . Stress: Not on file  Relationships  . Social connections:    Talks on phone: Not on file    Gets together: Not on file    Attends religious service: Not on file    Active member of club or organization: Not on file    Attends meetings of clubs or organizations: Not on file    Relationship status: Not on file  . Intimate partner violence:    Fear of current or ex partner: Not on file    Emotionally abused: Not on file    Physically abused: Not on file    Forced sexual activity: Not on file  Other Topics Concern  . Not on file  Social History  Narrative  . Not on file    Family History  Problem Relation Age of Onset  . Heart attack Father   . Heart disease Mother   . Stomach cancer Mother     ROS- All systems are reviewed and negative except as per the HPI above  Physical Exam: Vitals:   07/13/17 1525  BP: 118/74  Pulse: 86  SpO2: 93%  Weight: 191 lb 9.6 oz (86.9 kg)  Height: 5\' 9"  (1.753 m)   Wt Readings from Last 3 Encounters:  07/13/17 191 lb 9.6 oz (86.9 kg)  07/08/17 187 lb 6.4 oz (85 kg)  06/28/17 188 lb 9.6 oz (85.5 kg)    Labs: Lab Results  Component Value Date   NA 144 07/05/2017   K 4.6 07/05/2017   CL 103 07/05/2017   CO2 24 07/05/2017   GLUCOSE 108 (H) 07/05/2017   BUN 32 (H) 07/05/2017   CREATININE 1.63 (H) 07/05/2017   CALCIUM 9.7 07/05/2017   MG 2.0 06/24/2017   Lab Results  Component Value Date   INR 1.59 06/15/2017   Lab Results  Component Value Date   CHOL 114 11/30/2012   HDL 47.00 11/30/2012   LDLCALC 56 11/30/2012   TRIG 57.0 11/30/2012     GEN- The patient is elderly appearing, alert and oriented x 3 today.   Head- normocephalic, atraumatic Eyes-  Sclera clear, conjunctiva pink Ears- hearing intact Oropharynx- clear Neck- supple, no JVP Lymph- no cervical lymphadenopathy Lungs- Clear to ausculation bilaterally, normal work of breathing Heart-  irregular rate and rhythm, no murmurs, rubs or gallops, PMI not laterally displaced GI- distended, taunt  Extremities- no clubbing, cyanosis, or trace edema MS- no significant deformity or atrophy Skin- no rash or lesion Psych- euthymic mood, full affect Neuro- strength and sensation are intact  EKG-afib at 80 bpm, RBBB, LAFB, Bifascicular block Epic records reviewed  Echo-Study Conclusions  - Left ventricle: The  cavity size was normal. Wall thickness was   increased in a pattern of mild LVH. Systolic function was mildly   to moderately reduced. The estimated ejection fraction was in the   range of 40% to 45%.  Akinesis of the apical myocardium. - Ventricular septum: The contour showed systolic flattening. These   changes are consistent with RV volume and pressure overload. - Aortic valve: Trileaflet; normal thickness, mildly calcified   leaflets. Transvalvular velocity was increased. There was mild   stenosis. Valve area (VTI): 1.28 cm^2. Valve area (Vmax): 1.35   cm^2. Valve area (Vmean): 1.21 cm^2. - Aortic root: The aortic root was mildly dilated. - Mitral valve: Mildly calcified annulus. There was moderate to   severe regurgitation. - Left atrium: The atrium was severely dilated. - Right ventricle: The cavity size was dilated. Wall thickness was   normal. - Right atrium: The atrium was severely dilated. - Tricuspid valve: There was moderate regurgitation. - Pulmonary arteries: Systolic pressure could not be accurately   estimated. Suspect it is higher because of other characteristics.   PA peak pressure: 33 mm Hg (S).    Assessment and Plan: 1.  Persistent afib Cardioversion initially successful but ERAF Left atrium severely enlarged so may be difficult to restore and maintain SR Loading on  amiodarone 200 mg bid  1 week  away from cardioversion, scheduled with Dr. [name] on 4/25 Continue  5 mg to 1/2 tab with the addition of amiodarone  2. Chadsvasc score of at least 4 Continue eliquis 5 mg bid States no missed doses   3.  Acute combined sys/diastolic HF Weight up 6 lbs No benefit from increase in lasix to 40 mg bid for several days, took 60 mg this am, states he is not voiding that much despite increase in diuresis Repeat bmet today shows creatinine of 2.0, BUN of 39  I feel it will the safest to direct admit him for further diuresis with careful monitoring of kidney function,pt is in agreement.  He will be admitted to [facility] under general cardiology to Dr. Cherene Altes C. Albina Gosney, [facility] [address] [place], [place]  [id number] [phone]

## 2017-07-14 ENCOUNTER — Encounter (HOSPITAL_COMMUNITY): Payer: Self-pay | Admitting: *Deleted

## 2017-07-14 ENCOUNTER — Inpatient Hospital Stay (HOSPITAL_COMMUNITY): Payer: Medicare Other

## 2017-07-14 DIAGNOSIS — Z7901 Long term (current) use of anticoagulants: Secondary | ICD-10-CM

## 2017-07-14 DIAGNOSIS — I5023 Acute on chronic systolic (congestive) heart failure: Secondary | ICD-10-CM

## 2017-07-14 DIAGNOSIS — I480 Paroxysmal atrial fibrillation: Secondary | ICD-10-CM

## 2017-07-14 DIAGNOSIS — I255 Ischemic cardiomyopathy: Secondary | ICD-10-CM

## 2017-07-14 LAB — CBC WITH DIFFERENTIAL/PLATELET
BASOS ABS: 0 10*3/uL (ref 0.0–0.1)
Basophils Relative: 0 %
Eosinophils Absolute: 0.1 10*3/uL (ref 0.0–0.7)
Eosinophils Relative: 3 %
HEMATOCRIT: 36.7 % — AB (ref 39.0–52.0)
Hemoglobin: 12 g/dL — ABNORMAL LOW (ref 13.0–17.0)
LYMPHS ABS: 0.8 10*3/uL (ref 0.7–4.0)
LYMPHS PCT: 16 %
MCH: 30.4 pg (ref 26.0–34.0)
MCHC: 32.7 g/dL (ref 30.0–36.0)
MCV: 92.9 fL (ref 78.0–100.0)
MONO ABS: 0.5 10*3/uL (ref 0.1–1.0)
Monocytes Relative: 9 %
NEUTROS ABS: 3.5 10*3/uL (ref 1.7–7.7)
Neutrophils Relative %: 72 %
Platelets: 179 10*3/uL (ref 150–400)
RBC: 3.95 MIL/uL — ABNORMAL LOW (ref 4.22–5.81)
RDW: 15 % (ref 11.5–15.5)
WBC: 4.9 10*3/uL (ref 4.0–10.5)

## 2017-07-14 LAB — BASIC METABOLIC PANEL
Anion gap: 11 (ref 5–15)
BUN: 40 mg/dL — ABNORMAL HIGH (ref 6–20)
CALCIUM: 9 mg/dL (ref 8.9–10.3)
CO2: 23 mmol/L (ref 22–32)
CREATININE: 2.05 mg/dL — AB (ref 0.61–1.24)
Chloride: 105 mmol/L (ref 101–111)
GFR calc non Af Amer: 27 mL/min — ABNORMAL LOW (ref >60)
GFR, EST AFRICAN AMERICAN: 32 mL/min — AB (ref >60)
Glucose, Bld: 137 mg/dL — ABNORMAL HIGH (ref 65–99)
Potassium: 4.1 mmol/L (ref 3.5–5.1)
SODIUM: 139 mmol/L (ref 135–145)

## 2017-07-14 LAB — BRAIN NATRIURETIC PEPTIDE: B Natriuretic Peptide: 1463.4 pg/mL — ABNORMAL HIGH (ref 0.0–100.0)

## 2017-07-14 MED ORDER — FUROSEMIDE 10 MG/ML IJ SOLN
40.0000 mg | Freq: Two times a day (BID) | INTRAMUSCULAR | Status: DC
Start: 2017-07-14 — End: 2017-07-14
  Administered 2017-07-14: 40 mg via INTRAVENOUS
  Filled 2017-07-14: qty 4

## 2017-07-14 MED ORDER — FUROSEMIDE 10 MG/ML IJ SOLN
40.0000 mg | Freq: Two times a day (BID) | INTRAMUSCULAR | Status: DC
  Administered 2017-07-14 – 2017-07-21 (×11): 40 mg via INTRAVENOUS
  Filled 2017-07-14 (×13): qty 4

## 2017-07-14 MED ORDER — SODIUM CHLORIDE 0.9 % IV SOLN
250.0000 mL | INTRAVENOUS | Status: DC
  Administered 2017-07-15: 500 mL via INTRAVENOUS

## 2017-07-14 MED ORDER — SODIUM CHLORIDE 0.9% FLUSH
3.0000 mL | Freq: Two times a day (BID) | INTRAVENOUS | Status: DC
  Administered 2017-07-15 – 2017-07-20 (×5): 3 mL via INTRAVENOUS

## 2017-07-14 MED ORDER — SODIUM CHLORIDE 0.9% FLUSH
3.0000 mL | INTRAVENOUS | Status: DC | PRN
Start: 2017-07-14 — End: 2017-07-22

## 2017-07-14 NOTE — Progress Notes (Signed)
MD Meda Coffee aware of pt family requesting hemoglobin A1c in lab draw

## 2017-07-14 NOTE — Plan of Care (Signed)
  Problem: Cardiac: Goal: Ability to achieve and maintain adequate cardiopulmonary perfusion will improve Outcome: Progressing   Problem: Clinical Measurements: Goal: Diagnostic test results will improve Outcome: Progressing   Problem: Clinical Measurements: Goal: Cardiovascular complication will be avoided Outcome: Progressing

## 2017-07-14 NOTE — Care Management Note (Signed)
Case Management Note  Patient Details  Name: Donald [name] MRN: [id number] Date of Birth: 02-07-1927  Subjective/Objective:                 Spoke with patient at the bedside. Donald [name] describes himself as independent. Donald [name] drives to [place], denies barriers to getting medications. CM asked what we can do to keep him from having to come back to the hospital and Donald [name] states, "Keep his heart beating in rhythm." Patient not interested in community follow up, his granddaughter is RN CM at [facility] and Donald [name] keeps in contact with him well. Patient Donald [name] states Donald [name] has DME at home but Donald [name] doesn't use it. No CM needs at this time.    Action/Plan:   Expected Discharge Date:                  Expected Discharge Plan:  Home/Self Care  In-House Referral:     Discharge planning Services     Post Acute Care Choice:    Choice offered to:     DME Arranged:    DME Agency:     HH Arranged:    HH Agency:     Status of Service:  In process, will continue to follow  If discussed at Long Length of Stay Meetings, dates discussed:    Additional Comments:  Donald Collet, RN 07/14/2017, 8:48 AM

## 2017-07-14 NOTE — H&P (View-Only) (Signed)
Progress Note  Patient Name: Donald [name] Date of Encounter: 07/14/2017  Primary Cardiologist: Dr. [name]  Subjective   Pt states that he has been SOB since AF dx without improvement with Lasix. Denies chest pain or associated ACS symptoms.  Inpatient Medications    Scheduled Meds: . amiodarone  200 mg Oral BID  . apixaban  2.5 mg Oral BID  . bisoprolol  5 mg Oral Daily  . cholecalciferol  2,000 Units Oral Daily  . fenofibrate  160 mg Oral Daily  . latanoprost  1 drop Left Eye QHS  . potassium chloride  20 mEq Oral Daily  . pravastatin  40 mg Oral QHS  . sodium chloride flush  3 mL Intravenous Q12H  . tamsulosin  0.4 mg Oral QPM   Continuous Infusions: . sodium chloride     PRN Meds: sodium chloride, acetaminophen, albuterol, fluticasone furoate-vilanterol, sodium chloride flush   Vital Signs    Vitals:   07/13/17 2144 07/14/17 0014 07/14/17 0530 07/14/17 0835  BP: 105/81 97/76 93/71  96/79  Pulse: (!) 125 97 76 89  Resp: 14 19 18 18   Temp: 98.6 F (37 C) 97.7 F (36.5 C) 97.8 F (36.6 C) 97.7 F (36.5 C)  TempSrc: Oral Oral Oral Oral  SpO2: 97% 98% 97% 98%  Weight:   186 lb 11.7 oz (84.7 kg)   Height:        Intake/Output Summary (Last 24 hours) at 07/14/2017 1055 Last data filed at 07/14/2017 0846 Gross per 24 hour  Intake 920 ml  Output 575 ml  Net 345 ml   Filed Weights   07/13/17 1754 07/14/17 0530  Weight: 186 lb 8 oz (84.6 kg) 186 lb 11.7 oz (84.7 kg)    Physical Exam   General: Well developed, well nourished, NAD Skin: Warm, dry, intact  Head: Normocephalic, atraumatic, clear, moist mucus membranes. Neck: Negative for carotid bruits. No JVD Lungs: Diminished in lower lobes.  No wheezes, rales, or rhonchi. Breathing is unlabored. Cardiovascular: Irregularly irrregular with S1 S2. No murmurs, rubs or gallops Abdomen: Soft, non-tender, non-distended with normoactive bowel sounds. No obvious abdominal masses. MSK: Strength and tone  appear normal for age. 5/5 in all extremities Extremities: No edema. No clubbing or cyanosis. DP/PT pulses 2+ bilaterally Neuro: Alert and oriented. No focal deficits. No facial asymmetry. MAE spontaneously. Psych: Responds to questions appropriately with normal affect.    Labs    Chemistry Recent Labs  Lab 07/13/17 1609 07/14/17 0445  NA 140 139  K 4.8 4.1  CL 104 105  CO2 28 23  GLUCOSE 180* 137*  BUN 39* 40*  CREATININE 2.02* 2.05*  CALCIUM 9.3 9.0  GFRNONAA 28* 27*  GFRAA 32* 32*  ANIONGAP 8 11     Hematology Recent Labs  Lab 07/13/17 1609  WBC 4.6  RBC 4.21*  HGB 13.0  HCT 39.5  MCV 93.8  MCH 30.9  MCHC 32.9  RDW 15.3  PLT 180    Cardiac EnzymesNo results for input(s): TROPONINI in the last 168 hours. No results for input(s): TROPIPOC in the last 168 hours.   BNPNo results for input(s): BNP, PROBNP in the last 168 hours.   DDimer No results for input(s): DDIMER in the last 168 hours.   Radiology    No results found.  Telemetry    07/14/17 atrial fibrillation- Personally Reviewed  ECG     07/13/17 Atrial fibrillation with RBBB HR 87- Personally Reviewed  Cardiac Studies   Echocardiogram  06/13/17: Study Conclusions  - Left ventricle: The cavity size was normal. Wall thickness was   increased in a pattern of mild LVH. Systolic function was mildly   to moderately reduced. The estimated ejection fraction was in the   range of 40% to 45%. Akinesis of the apical myocardium. - Ventricular septum: The contour showed systolic flattening. These   changes are consistent with RV volume and pressure overload. - Aortic valve: Trileaflet; normal thickness, mildly calcified   leaflets. Transvalvular velocity was increased. There was mild   stenosis. Valve area (VTI): 1.28 cm^2. Valve area (Vmax): 1.35   cm^2. Valve area (Vmean): 1.21 cm^2. - Aortic root: The aortic root was mildly dilated. - Mitral valve: Mildly calcified annulus. There was moderate to    severe regurgitation. - Left atrium: The atrium was severely dilated. - Right ventricle: The cavity size was dilated. Wall thickness was   normal. - Right atrium: The atrium was severely dilated. - Tricuspid valve: There was moderate regurgitation. - Pulmonary arteries: Systolic pressure could not be accurately   estimated. Suspect it is higher because of other characteristics.   PA peak pressure: 33 mm Hg (S).  12/03/16 Stress test:  Defect in distal anterior, distal inferior and apical walls consistent with small region of scar. Inferior thinning consistent with soft tissue attenuation and/or scar No ischemia.  Nuclear stress EF: 46%.  This is an intermediate risk study.   Patient Profile     84 y.o. male PMH of CAD s/p CABG1998, h/o ICM with improved EF, prior CVA, prior mural thrombus on chronic coumadin Rx, prostate CA, and h/o GI bleed. He had a cardiac catheterization in June 2013 that showed 100%ostLAD, distal LAD filled with RV branch off of RCA, PLA possibly occluded by most distal RCA appears to be filled from LIMA collateral, LIMA patent but does not supply distal LAD. Medical therapy was recommended. Last echocardiogram obtained on 11/13/2013 showed EF 50-55%, akinesis of the entire apical myocardium, grade 1 DD, PA peak pressure 56 mmHg. Ejection fraction has improved from the previous 40-45% in 2007. He is intolerant to all beta blockers.  Myoview obtained on 11/02/2016 showed EF 46%, defect in the distal anterior, distal inferior, apical wall consistent with small regional scar, inferior thinning consistent with soft tissue attenuation. Otherwise no ischemia.  He was evaluated on 06/08/2017 for shortness of breath and and noted to be in atrial fibrillation with a heart rate of 130.  This is new for him.  He was also found to have some degree of heart failure.  Due to worsening dyspnea on exertion, he was eventually admitted for persistent atrial fibrillation and new onset of  heart failure.  Echocardiogram obtained on 06/13/2017 showed EF 40-45%, mild aortic stenosis, mild LVH, akinesis of the apical myocardium, mild aortic root dilatation, moderate to severe MR, moderate TR. she was placed on Eliquis and underwent cardioversion on 06/15/2017.  The plan is to repeat echocardiogram once sinus rhythm is reestablished, if we feel mitral regurgitation is contributing to heart failure in the future, may consider referral for mitral clip.  Otherwise given his age and prior CABG, would like to be conservative if possible.  He was eventually discharged with Lasix 40 mg daily.  His discharge weight was 183 pounds.  Assessment & Plan    1.  Persistent atrial fibrillaiton: -DCCV in 05/2017 initially successful but converted back to AF within a few days -Left and right atrium severely enlarged per echocardiogram making if potentially difficult to  restore and maintain NSR -On amiodarone 200 mg PO BID -Initially scheduled for OP DCCV with Dr. [name] on 4/25 -Has been taking Eliquis 2.5mg  PO BID with no reported missed doses  -Plan is for DCCV while inpatient given how symptomatic he has become over the last several weeks  -Chadsvasc score of at least 4  2.  Acute on chronic combined systolic and diastolic heart failure: -Pt with daily weight log at bedside. Reports that he has been SOB since his dx of AF in March 2019. He had a recent hospitalization in early April in which he states he has not been back to his baseline.  -Weight is now up 6lbs, to 186lb  -Describes no benefit from increase in lasix to 40 mg BID for several days. Took 60 mg on 07/13/17 at home with no increase in urination.  -Repeat BMET shows creatinine up to 2.0 with a BUN of 39  -On 06/28/17>> his Lasix was increased to 40 mg twice daily for 2 days then decreased down to 60 mg daily thereafter   -Unfortunately, his renal function is worsening at this time -Cannot add ACE-I or ARB secondary to worsening renal  function -One time Lasix dose of 40 mg IV given on admission, K+ supplementation (4.1 today) -Bisoprolol to 5 mg daily -Will order CXR and BNP   3. Coronary artery disease involving coronary bypass graft of native heart without angina pectoris: -Denies recent chest pain and other anginal symptoms -BB, statin  4. Mitral valve insufficiency, unspecified etiology: -Per echocardiogram on 05/2017 -No obvious murmur -Could be a contributing factor for his symptoms   5. Chronic kidney disease stage II: -Creatinine today 2.05 -Last hospitalization 1.36>1.32>1.63 (07/05/17)>2.02 on admission    Signed, Kathyrn Drown NP-C [facility] Pager: [phone] 07/14/2017, 10:55 AM     For questions or updates, please contact   Please consult [email or web address] for contact info under Cardiology/STEMI.  The patient was seen, examined and discussed with Kathyrn Drown, NP  and I agree with the above.   He continues to feel SOB with minimal exertion and at rest, we will start lasix 40 mg iv BID, check BNP, cra 2.0 from baseline 1.4, we will try to arrange for a DCCV while in the hospital. Nuclear stress test in 11/2017 - old infarction but no significant ischemia.  Ena Dawley, MD 07/14/2017

## 2017-07-14 NOTE — Progress Notes (Signed)
Pt signed consent, placed in chart

## 2017-07-14 NOTE — Progress Notes (Signed)
Pt granddaughter at bedside requesting update Per pt request provided update  Pt granddaughter requesting A1C, will inform MD when rounding

## 2017-07-14 NOTE — Consult Note (Signed)
   [facility] Inpatient Consult   07/14/2017  Trampas Stettner Zeidman November 10, 1926 [id number]   [facility].Patient was recently active with [place] [facility] with EMMI HF follow up for new HF in March.  Patient having symptoms of shortness of breath and admitted with Atrial Fibrillation.  Patient is in the [place] [facility] [place] [facility] services under patient's Medicare plan. Patient has declined community follow up states he is active and his granddaughter is a Marine scientist. Met with the patient at the bedside.  He states,"In regards to those calls [EMMI HF listed] I  feel that at this point they have lost their meaning for meaning, it's becoming a bit of a pain. Could we have those stopped."  Offered the patient a reminder and another 24 hour nurse advise line and he states, "I do have the last one you gave me and I will call you if I questions, I will call."  Patient was gracious and nice but declines [facility] Care Management follow up.  He states, "At this point my doctors and I are working hard on getting this right."  Will have the EMMI calls cancelled at this time.  For questions contact:   Natividad Brood, RN BSN [facility] Liaison  [phone] business mobile phone Toll free office [phone]

## 2017-07-14 NOTE — Progress Notes (Addendum)
Progress Note  Patient Name: Donald [name] Date of Encounter: 07/14/2017  Primary Cardiologist: Dr. [name]  Subjective   Pt states that he has been SOB since AF dx without improvement with Lasix. Denies chest pain or associated ACS symptoms.  Inpatient Medications    Scheduled Meds: . amiodarone  200 mg Oral BID  . apixaban  2.5 mg Oral BID  . bisoprolol  5 mg Oral Daily  . cholecalciferol  2,000 Units Oral Daily  . fenofibrate  160 mg Oral Daily  . latanoprost  1 drop Left Eye QHS  . potassium chloride  20 mEq Oral Daily  . pravastatin  40 mg Oral QHS  . sodium chloride flush  3 mL Intravenous Q12H  . tamsulosin  0.4 mg Oral QPM   Continuous Infusions: . sodium chloride     PRN Meds: sodium chloride, acetaminophen, albuterol, fluticasone furoate-vilanterol, sodium chloride flush   Vital Signs    Vitals:   07/13/17 2144 07/14/17 0014 07/14/17 0530 07/14/17 0835  BP: 105/81 97/76 93/71  96/79  Pulse: (!) 125 97 76 89  Resp: 14 19 18 18   Temp: 98.6 F (37 C) 97.7 F (36.5 C) 97.8 F (36.6 C) 97.7 F (36.5 C)  TempSrc: Oral Oral Oral Oral  SpO2: 97% 98% 97% 98%  Weight:   186 lb 11.7 oz (84.7 kg)   Height:        Intake/Output Summary (Last 24 hours) at 07/14/2017 1055 Last data filed at 07/14/2017 0846 Gross per 24 hour  Intake 920 ml  Output 575 ml  Net 345 ml   Filed Weights   07/13/17 1754 07/14/17 0530  Weight: 186 lb 8 oz (84.6 kg) 186 lb 11.7 oz (84.7 kg)    Physical Exam   General: Well developed, well nourished, NAD Skin: Warm, dry, intact  Head: Normocephalic, atraumatic, clear, moist mucus membranes. Neck: Negative for carotid bruits. No JVD Lungs: Diminished in lower lobes.  No wheezes, rales, or rhonchi. Breathing is unlabored. Cardiovascular: Irregularly irrregular with S1 S2. No murmurs, rubs or gallops Abdomen: Soft, non-tender, non-distended with normoactive bowel sounds. No obvious abdominal masses. MSK: Strength and tone  appear normal for age. 5/5 in all extremities Extremities: No edema. No clubbing or cyanosis. DP/PT pulses 2+ bilaterally Neuro: Alert and oriented. No focal deficits. No facial asymmetry. MAE spontaneously. Psych: Responds to questions appropriately with normal affect.    Labs    Chemistry Recent Labs  Lab 07/13/17 1609 07/14/17 0445  NA 140 139  K 4.8 4.1  CL 104 105  CO2 28 23  GLUCOSE 180* 137*  BUN 39* 40*  CREATININE 2.02* 2.05*  CALCIUM 9.3 9.0  GFRNONAA 28* 27*  GFRAA 32* 32*  ANIONGAP 8 11     Hematology Recent Labs  Lab 07/13/17 1609  WBC 4.6  RBC 4.21*  HGB 13.0  HCT 39.5  MCV 93.8  MCH 30.9  MCHC 32.9  RDW 15.3  PLT 180    Cardiac EnzymesNo results for input(s): TROPONINI in the last 168 hours. No results for input(s): TROPIPOC in the last 168 hours.   BNPNo results for input(s): BNP, PROBNP in the last 168 hours.   DDimer No results for input(s): DDIMER in the last 168 hours.   Radiology    No results found.  Telemetry    07/14/17 atrial fibrillation- Personally Reviewed  ECG     07/13/17 Atrial fibrillation with RBBB HR 87- Personally Reviewed  Cardiac Studies   Echocardiogram  06/13/17: Study Conclusions  - Left ventricle: The cavity size was normal. Wall thickness was   increased in a pattern of mild LVH. Systolic function was mildly   to moderately reduced. The estimated ejection fraction was in the   range of 40% to 45%. Akinesis of the apical myocardium. - Ventricular septum: The contour showed systolic flattening. These   changes are consistent with RV volume and pressure overload. - Aortic valve: Trileaflet; normal thickness, mildly calcified   leaflets. Transvalvular velocity was increased. There was mild   stenosis. Valve area (VTI): 1.28 cm^2. Valve area (Vmax): 1.35   cm^2. Valve area (Vmean): 1.21 cm^2. - Aortic root: The aortic root was mildly dilated. - Mitral valve: Mildly calcified annulus. There was moderate to    severe regurgitation. - Left atrium: The atrium was severely dilated. - Right ventricle: The cavity size was dilated. Wall thickness was   normal. - Right atrium: The atrium was severely dilated. - Tricuspid valve: There was moderate regurgitation. - Pulmonary arteries: Systolic pressure could not be accurately   estimated. Suspect it is higher because of other characteristics.   PA peak pressure: 33 mm Hg (S).  12/03/16 Stress test:  Defect in distal anterior, distal inferior and apical walls consistent with small region of scar. Inferior thinning consistent with soft tissue attenuation and/or scar No ischemia.  Nuclear stress EF: 46%.  This is an intermediate risk study.   Patient Profile     84 y.o. male PMH of CAD s/p CABG1998, h/o ICM with improved EF, prior CVA, prior mural thrombus on chronic coumadin Rx, prostate CA, and h/o GI bleed. He had a cardiac catheterization in June 2013 that showed 100%ostLAD, distal LAD filled with RV branch off of RCA, PLA possibly occluded by most distal RCA appears to be filled from LIMA collateral, LIMA patent but does not supply distal LAD. Medical therapy was recommended. Last echocardiogram obtained on 11/13/2013 showed EF 50-55%, akinesis of the entire apical myocardium, grade 1 DD, PA peak pressure 56 mmHg. Ejection fraction has improved from the previous 40-45% in 2007. He is intolerant to all beta blockers.  Myoview obtained on 11/02/2016 showed EF 46%, defect in the distal anterior, distal inferior, apical wall consistent with small regional scar, inferior thinning consistent with soft tissue attenuation. Otherwise no ischemia.  He was evaluated on 06/08/2017 for shortness of breath and and noted to be in atrial fibrillation with a heart rate of 130.  This is new for him.  He was also found to have some degree of heart failure.  Due to worsening dyspnea on exertion, he was eventually admitted for persistent atrial fibrillation and new onset of  heart failure.  Echocardiogram obtained on 06/13/2017 showed EF 40-45%, mild aortic stenosis, mild LVH, akinesis of the apical myocardium, mild aortic root dilatation, moderate to severe MR, moderate TR. she was placed on Eliquis and underwent cardioversion on 06/15/2017.  The plan is to repeat echocardiogram once sinus rhythm is reestablished, if we feel mitral regurgitation is contributing to heart failure in the future, may consider referral for mitral clip.  Otherwise given his age and prior CABG, would like to be conservative if possible.  He was eventually discharged with Lasix 40 mg daily.  His discharge weight was 183 pounds.  Assessment & Plan    1.  Persistent atrial fibrillaiton: -DCCV in 05/2017 initially successful but converted back to AF within a few days -Left and right atrium severely enlarged per echocardiogram making if potentially difficult to  restore and maintain NSR -On amiodarone 200 mg PO BID -Initially scheduled for OP DCCV with Dr. [name] on 4/25 -Has been taking Eliquis 2.5mg  PO BID with no reported missed doses  -Plan is for DCCV while inpatient given how symptomatic he has become over the last several weeks  -Chadsvasc score of at least 4  2.  Acute on chronic combined systolic and diastolic heart failure: -Pt with daily weight log at bedside. Reports that he has been SOB since his dx of AF in March 2019. He had a recent hospitalization in early April in which he states he has not been back to his baseline.  -Weight is now up 6lbs, to 186lb  -Describes no benefit from increase in lasix to 40 mg BID for several days. Took 60 mg on 07/13/17 at home with no increase in urination.  -Repeat BMET shows creatinine up to 2.0 with a BUN of 39  -On 06/28/17>> his Lasix was increased to 40 mg twice daily for 2 days then decreased down to 60 mg daily thereafter   -Unfortunately, his renal function is worsening at this time -Cannot add ACE-I or ARB secondary to worsening renal  function -One time Lasix dose of 40 mg IV given on admission, K+ supplementation (4.1 today) -Bisoprolol to 5 mg daily -Will order CXR and BNP   3. Coronary artery disease involving coronary bypass graft of native heart without angina pectoris: -Denies recent chest pain and other anginal symptoms -BB, statin  4. Mitral valve insufficiency, unspecified etiology: -Per echocardiogram on 05/2017 -No obvious murmur -Could be a contributing factor for his symptoms   5. Chronic kidney disease stage II: -Creatinine today 2.05 -Last hospitalization 1.36>1.32>1.63 (07/05/17)>2.02 on admission    Signed, Kathyrn Drown NP-C [facility] Pager: [phone] 07/14/2017, 10:55 AM     For questions or updates, please contact   Please consult [email or web address] for contact info under Cardiology/STEMI.  The patient was seen, examined and discussed with Kathyrn Drown, NP  and I agree with the above.   He continues to feel SOB with minimal exertion and at rest, we will start lasix 40 mg iv BID, check BNP, cra 2.0 from baseline 1.4, we will try to arrange for a DCCV while in the hospital. Nuclear stress test in 11/2017 - old infarction but no significant ischemia.  Ena Dawley, MD 07/14/2017

## 2017-07-15 ENCOUNTER — Ambulatory Visit (HOSPITAL_COMMUNITY): Payer: Medicare Other | Admitting: Nurse Practitioner

## 2017-07-15 ENCOUNTER — Inpatient Hospital Stay (HOSPITAL_COMMUNITY): Payer: Medicare Other | Admitting: Anesthesiology

## 2017-07-15 ENCOUNTER — Encounter (HOSPITAL_COMMUNITY): Payer: Self-pay | Admitting: Anesthesiology

## 2017-07-15 ENCOUNTER — Encounter (HOSPITAL_COMMUNITY): Payer: Self-pay | Admitting: Certified Registered"

## 2017-07-15 ENCOUNTER — Encounter (HOSPITAL_COMMUNITY)
Admission: AD | Disposition: A | Discharge status: Home / Self Care | Payer: Self-pay | Source: Ambulatory Visit | Attending: Cardiology

## 2017-07-15 DIAGNOSIS — Z950 Presence of cardiac pacemaker: Secondary | ICD-10-CM

## 2017-07-15 HISTORY — PX: CARDIOVERSION: SHX1299

## 2017-07-15 LAB — BASIC METABOLIC PANEL
Anion gap: 8 (ref 5–15)
BUN: 40 mg/dL — ABNORMAL HIGH (ref 6–20)
CALCIUM: 9 mg/dL (ref 8.9–10.3)
CO2: 26 mmol/L (ref 22–32)
Chloride: 106 mmol/L (ref 101–111)
Creatinine, Ser: 2.08 mg/dL — ABNORMAL HIGH (ref 0.61–1.24)
GFR, EST AFRICAN AMERICAN: 31 mL/min — AB (ref >60)
GFR, EST NON AFRICAN AMERICAN: 27 mL/min — AB (ref >60)
GLUCOSE: 113 mg/dL — AB (ref 65–99)
POTASSIUM: 4.1 mmol/L (ref 3.5–5.1)
SODIUM: 140 mmol/L (ref 135–145)

## 2017-07-15 LAB — HEMOGLOBIN A1C
Hgb A1c MFr Bld: 5.9 % — ABNORMAL HIGH (ref 4.8–5.6)
Mean Plasma Glucose: 122.63 mg/dL

## 2017-07-15 SURGERY — CARDIOVERSION
Anesthesia: General

## 2017-07-15 MED ORDER — EPHEDRINE SULFATE 50 MG/ML IJ SOLN
INTRAMUSCULAR | Status: DC | PRN
  Administered 2017-07-15: 10 mg via INTRAVENOUS

## 2017-07-15 MED ORDER — LIDOCAINE HCL (CARDIAC) PF 100 MG/5ML IV SOSY
PREFILLED_SYRINGE | INTRAVENOUS | Status: DC | PRN
  Administered 2017-07-15: 100 mg via INTRATRACHEAL

## 2017-07-15 MED ORDER — PROPOFOL 10 MG/ML IV BOLUS
INTRAVENOUS | Status: DC | PRN
  Administered 2017-07-15: 40 mg via INTRAVENOUS

## 2017-07-15 MED ORDER — HYDROCORTISONE 1 % EX CREA
TOPICAL_CREAM | Freq: Three times a day (TID) | CUTANEOUS | Status: DC | PRN
  Administered 2017-07-16: 1 via TOPICAL
  Filled 2017-07-15: qty 28

## 2017-07-15 MED ORDER — METOLAZONE 2.5 MG PO TABS
2.5000 mg | ORAL_TABLET | Freq: Every day | ORAL | Status: DC
  Administered 2017-07-15 – 2017-07-16 (×2): 2.5 mg via ORAL
  Filled 2017-07-15 (×3): qty 1

## 2017-07-15 MED ORDER — PHENYLEPHRINE HCL 10 MG/ML IJ SOLN
INTRAMUSCULAR | Status: DC | PRN
  Administered 2017-07-15 (×2): 80 ug via INTRAVENOUS
  Administered 2017-07-15: 40 ug via INTRAVENOUS
  Administered 2017-07-15 (×2): 80 ug via INTRAVENOUS

## 2017-07-15 NOTE — Progress Notes (Signed)
1700 Patient still on SB/NSR.

## 2017-07-15 NOTE — H&P (Addendum)
Primary Care Physician: [name], [name], MD Referring Physician: Dr. [name] Cardiologist: Dr. Caprice Renshaw is a 84 y.o. male with a h/o asthma, CAD,CVA, that is in the afib clinic today for evulation of shortness of breath. Pt has been suspected of having afib in the past, but has has never been documented. He called  Dr. Jacalyn Lefevre office with c/o of shortness of breath and referred here.  His EKG shows afib at 130 bpm, RBBB, qrs int 136 ms, qtc 503 ms.   For his age he is highly functioning, not aware of the rapid HR, just the shortness of breath. He has been noticing these symptoms for about one month. He is already on eliquis 5 mg bid, chadsvasc score of at least 4. His weight was 180 lbs in a hospital gown when he had lithotripsy 1/31. Today it is 197, fully dressed. He admits to eating more and gaining 30 lbs over the last year. Most of the shortness of breath is with exertion.  No excessive caffeine, no alcohol, tobacco, denies snoring.   F/u in afib clinic after starting bisoprolol 5 mg daily. His HR was better controlled now. He was not sure on last visit if he was persistent or paroxysmal but with his symptoms of exertional shortness of breath  and additional weight gain, I feel he is persistent. He has not missed any doses of eliquis for at least 3 weeks so will plan for cardioversion. Will plan on CXR today and starting diuretics.   F/u afib clinic, 3/28, since I saw him last, pt's shortness of breath worsened and he was admitted  with CHF. He was diuresed and cardioverted. Unfortunately, he is back in afib today, rate controlled. Echo showed EF of 40-45%, left/rt atrium severely dilated, mod to severe regurgitation, TR mod regurgitation. Pt has noted that shortness of breath became more noticeable after 3 days after cardioversion. He states that weight at home is stable, 187 today. Pt's TSH has been elevated but PCP checked the full thyroid panel and other values were  normal  F/u in afib clinic 4/11,he called into office and c/o weight gain and increased shortness of breath. He is currently loading on amiodarone and is 2 weeks away from cardioversion. He picked up 3 lbs of fluid in the last 1-2 days.He is currently  on 60 mg lasix daily.  F/u in afib clinic, 4/16, despite increasing lasix to 40 mg bid for several days, he returns to afib clinic up 4-6 lbs in the last week. He feels miserable with increase in abdominal girth, shortness of breath. Appetite deminished. His last creatinine  was  at 1.6. Repeat bmet today shows creatinine of 2.0.   Today, he denies symptoms of palpitations, chest pain,  orthopnea, PND, lower extremity edema, dizziness, presyncope, syncope, or neurologic sequela.+ for acute on chronic dyspnea, increase weight  gain, abdominal girth. The patient is tolerating medications without difficulties and is otherwise without complaint today.   Past Medical History:  Diagnosis Date  . Asthma    with worsening with beta blockade  . CAD (coronary artery disease)   . CVA (cerebral infarction) 2012   tia, mild no residual defecits  . GI bleed 8 yrs ago   due to doll fully vessel which was clipped  . Glaucoma    excellent cataracts  . History of kidney stones   . Ischemic cardiomyopathy   . Moderate to severe mitral regurgitation 06/16/2017  . Nephrolithiasis   .  Post-infarction apical thrombus ([place])   . Prostate cancer ([place]) 2012  . Radiation proctitis Feb 2015   treated with APC  . Tubular adenoma 04/2013   Dr. Hilarie Fredrickson   Past Surgical History:  Procedure Laterality Date  . cardiac stents  2003  . CARDIOVERSION N/A 06/15/2017   Procedure: CARDIOVERSION;  Surgeon: Acie Fredrickson Wonda Cheng, MD;  Location: [facility] ENDOSCOPY;  Service: Cardiovascular;  Laterality: N/A;  . COLONOSCOPY WITH PROPOFOL N/A 05/05/2013   Procedure: COLONOSCOPY WITH PROPOFOL;  Surgeon: Jerene Bears, MD;  Location: [facility] ENDOSCOPY;  Service: Gastroenterology;  Laterality: N/A;  .  CORONARY ARTERY BYPASS GRAFT  1998   LIMA to the LAD and saphenous vein graft tothe diagonal  . EXTRACORPOREAL SHOCK WAVE LITHOTRIPSY Right 04/29/2017   Procedure: RIGHT EXTRACORPOREAL SHOCK WAVE LITHOTRIPSY (ESWL);  Surgeon: Alexis Frock, MD;  Location: [facility] ORS;  Service: Urology;  Laterality: Right;  . history of radiation treatment  2012   x 40 treatments done  . KNEE ARTHROSCOPY  2016 or 2017   Dr Gladstone Lighter ;   . PARTIAL KNEE ARTHROPLASTY Right 01/13/2017   Procedure: UNICOMPARTMENTAL RIGHT KNEE;  Surgeon: Gaynelle Arabian, MD;  Location: [facility] ORS;  Service: Orthopedics;  Laterality: Right;  with block    Current Facility-Administered Medications  Medication Dose Route Frequency Provider Last Rate Last Dose  . [MAR Hold] 0.9 %  sodium chloride infusion  250 mL Intravenous PRN Rosita Fire, Brittainy M, PA-C      . 0.9 %  sodium chloride infusion  250 mL Intravenous Continuous Kathyrn Drown D, NP      . Doug Sou Hold] acetaminophen (TYLENOL) tablet 650 mg  650 mg Oral Q4H PRN Lyda Jester M, PA-C      . [MAR Hold] albuterol (PROVENTIL) (2.5 MG/3ML) 0.083% nebulizer solution 3 mL  3 mL Inhalation Q6H PRN Consuelo Pandy, PA-C      . [MAR Hold] amiodarone (PACERONE) tablet 200 mg  200 mg Oral BID Lyda Jester M, PA-C   200 mg at 07/14/17 2320  . [MAR Hold] apixaban (ELIQUIS) tablet 2.5 mg  2.5 mg Oral BID Lyda Jester M, PA-C   2.5 mg at 07/14/17 2320  . [MAR Hold] bisoprolol (ZEBETA) tablet 5 mg  5 mg Oral Daily Dorothy Spark, MD   5 mg at 07/14/17 0909  . [MAR Hold] cholecalciferol (VITAMIN D) tablet 2,000 Units  2,000 Units Oral Daily Consuelo Pandy, PA-C   2,000 Units at 07/14/17 4270  . [MAR Hold] fenofibrate tablet 160 mg  160 mg Oral Daily Lyda Jester M, PA-C   160 mg at 07/14/17 6237  . [MAR Hold] fluticasone furoate-vilanterol (BREO ELLIPTA) 100-25 MCG/INH 1 puff  1 puff Inhalation Daily PRN Consuelo Pandy, PA-C   1 puff at 07/15/17 0225  . [MAR  Hold] furosemide (LASIX) injection 40 mg  40 mg Intravenous BID Dorothy Spark, MD   40 mg at 07/14/17 2320  . [MAR Hold] latanoprost (XALATAN) 0.005 % ophthalmic solution 1 drop  1 drop Left Eye QHS Lyda Jester M, PA-C   1 drop at 07/14/17 2320  . [MAR Hold] potassium chloride (K-DUR,KLOR-CON) CR tablet 20 mEq  20 mEq Oral Daily Lyda Jester M, PA-C   20 mEq at 07/14/17 6283  . [MAR Hold] pravastatin (PRAVACHOL) tablet 40 mg  40 mg Oral QHS Lyda Jester M, PA-C   40 mg at 07/14/17 2320  . [MAR Hold] sodium chloride flush (NS) 0.9 % injection 3 mL  3 mL Intravenous  Q12H Lyda Jester M, PA-C   3 mL at 07/14/17 2322  . [MAR Hold] sodium chloride flush (NS) 0.9 % injection 3 mL  3 mL Intravenous PRN Lyda Jester M, PA-C      . [MAR Hold] sodium chloride flush (NS) 0.9 % injection 3 mL  3 mL Intravenous Q12H Kathyrn Drown D, NP      . Doug Sou Hold] sodium chloride flush (NS) 0.9 % injection 3 mL  3 mL Intravenous PRN Tommie Raymond, NP      . Doug Sou Hold] tamsulosin [facility]) capsule 0.4 mg  0.4 mg Oral QPM Lyda Jester M, PA-C   0.4 mg at 07/14/17 1628    Allergies  Allergen Reactions  . Keflex [Cephalexin] Nausea And Vomiting and Other (See Comments)    'Killed all GI enzymes" (also) and patient prefers to not take this  . Morphine Nausea Only    Social History   Socioeconomic History  . Marital status: Widowed    Spouse name: Not on file  . Number of children: 3  . Years of education: Not on file  . Highest education level: Not on file  Occupational History  . Occupation: Retired Conservation officer, [employer]  Social Needs  . Financial resource strain: Not on file  . Food insecurity:    Worry: Not on file    Inability: Not on file  . Transportation needs:    Medical: Not on file    Non-medical: Not on file  Tobacco Use  . Smoking status: Never Smoker  . Smokeless tobacco: Never Used  Substance and Sexual Activity  . Alcohol use: Yes    Alcohol/week:  0.0 oz    Comment: occasional beer or wine  . Drug use: No  . Sexual activity: Not on file  Lifestyle  . Physical activity:    Days per week: Not on file    Minutes per session: Not on file  . Stress: Not on file  Relationships  . Social connections:    Talks on phone: Not on file    Gets together: Not on file    Attends religious service: Not on file    Active member of club or organization: Not on file    Attends meetings of clubs or organizations: Not on file    Relationship status: Not on file  . Intimate partner violence:    Fear of current or ex partner: Not on file    Emotionally abused: Not on file    Physically abused: Not on file    Forced sexual activity: Not on file  Other Topics Concern  . Not on file  Social History Narrative  . Not on file    Family History  Problem Relation Age of Onset  . Heart attack Father   . Heart disease Mother   . Stomach cancer Mother     ROS- All systems are reviewed and negative except as per the HPI above  Physical Exam: Vitals:   07/14/17 1658 07/14/17 2045 07/14/17 2315 07/15/17 0704  BP: 102/70 109/83 106/79 105/81  Pulse: (!) 45 93 66 74  Resp: 18 18 18 18   Temp: 97.7 F (36.5 C) (!) 97.4 F (36.3 C)  98 F (36.7 C)  TempSrc: Oral Oral  Oral  SpO2: 98% 97% 95% 97%  Weight:    186 lb 6.4 oz (84.6 kg)  Height:       Wt Readings from Last 3 Encounters:  07/15/17 186 lb 6.4 oz (84.6 kg)  07/13/17  191 lb 9.6 oz (86.9 kg)  07/08/17 187 lb 6.4 oz (85 kg)    Labs: Lab Results  Component Value Date   NA 140 07/15/2017   K 4.1 07/15/2017   CL 106 07/15/2017   CO2 26 07/15/2017   GLUCOSE 113 (H) 07/15/2017   BUN 40 (H) 07/15/2017   CREATININE 2.08 (H) 07/15/2017   CALCIUM 9.0 07/15/2017   MG 2.0 06/24/2017   Lab Results  Component Value Date   INR 1.59 06/15/2017   Lab Results  Component Value Date   CHOL 114 11/30/2012   HDL 47.00 11/30/2012   LDLCALC 56 11/30/2012   TRIG 57.0 11/30/2012     GEN-  The patient is elderly appearing, alert and oriented x 3 today.   Head- normocephalic, atraumatic Eyes-  Sclera clear, conjunctiva pink Ears- hearing intact Oropharynx- clear Neck- supple, no JVP Lymph- no cervical lymphadenopathy Lungs- Clear to ausculation bilaterally, normal work of breathing Heart-  irregular rate and rhythm, no murmurs, rubs or gallops, PMI not laterally displaced GI- distended, taunt  Extremities- no clubbing, cyanosis, or trace edema MS- no significant deformity or atrophy Skin- no rash or lesion Psych- euthymic mood, full affect Neuro- strength and sensation are intact  EKG-afib at 80 bpm, RBBB, LAFB, Bifascicular block Epic records reviewed  Echo-Study Conclusions  - Left ventricle: The cavity size was normal. Wall thickness was   increased in a pattern of mild LVH. Systolic function was mildly   to moderately reduced. The estimated ejection fraction was in the   range of 40% to 45%. Akinesis of the apical myocardium. - Ventricular septum: The contour showed systolic flattening. These   changes are consistent with RV volume and pressure overload. - Aortic valve: Trileaflet; normal thickness, mildly calcified   leaflets. Transvalvular velocity was increased. There was mild   stenosis. Valve area (VTI): 1.28 cm^2. Valve area (Vmax): 1.35   cm^2. Valve area (Vmean): 1.21 cm^2. - Aortic root: The aortic root was mildly dilated. - Mitral valve: Mildly calcified annulus. There was moderate to   severe regurgitation. - Left atrium: The atrium was severely dilated. - Right ventricle: The cavity size was dilated. Wall thickness was   normal. - Right atrium: The atrium was severely dilated. - Tricuspid valve: There was moderate regurgitation. - Pulmonary arteries: Systolic pressure could not be accurately   estimated. Suspect it is higher because of other characteristics.   PA peak pressure: 33 mm Hg (S).    Assessment and Plan: 1.  Persistent  afib Cardioversion initially successful but ERAF Left atrium severely enlarged so may be difficult to restore and maintain SR Loading on  amiodarone 200 mg bid  1 week  away from cardioversion, scheduled with Dr. [name] on 4/25 Continue  5 mg to 1/2 tab with the addition of amiodarone  2. Chadsvasc score of at least 4 Continue eliquis 5 mg bid States no missed doses   3.  Acute combined sys/diastolic HF Weight up 6 lbs No benefit from increase in lasix to 40 mg bid for several days, took 60 mg this am, states he is not voiding that much despite increase in diuresis Repeat bmet today shows creatinine of 2.0, BUN of 39  I feel it will the safest to direct admit him for further diuresis with careful monitoring of kidney function,pt is in agreement.  He will be admitted to [facility] under general cardiology to Dr. Cherene Altes C. [place], [facility] [address]  [place], [place] [address] [phone]

## 2017-07-15 NOTE — Transfer of Care (Signed)
Immediate Anesthesia Transfer of Care Note  Patient: Donald [name]  Procedure(s) Performed: CARDIOVERSION (N/A )  Patient Location: Endoscopy Unit  Anesthesia Type:General  Level of Consciousness: awake, alert  and oriented  Airway & Oxygen Therapy: Patient Spontanous Breathing  Post-op Assessment: Report given to RN, Post -op Vital signs reviewed and stable and Patient moving all extremities X 4  Post vital signs: Reviewed and stable  Last Vitals:  Vitals Value Taken Time  BP    Temp    Pulse    Resp    SpO2      Last Pain:  Vitals:   07/15/17 0853  TempSrc: Oral  PainSc: 0-No pain         Complications: No apparent anesthesia complications

## 2017-07-15 NOTE — Anesthesia Preprocedure Evaluation (Deleted)
Anesthesia Evaluation Anesthesia Physical Anesthesia Plan Anesthesia Quick Evaluation  

## 2017-07-15 NOTE — Interval H&P Note (Signed)
History and Physical Interval Note:  07/15/2017 9:43 AM  Donald [name]  has presented today for surgery, with the diagnosis of afib  The various methods of treatment have been discussed with the patient and family. After consideration of risks, benefits and other options for treatment, the patient has consented to  Procedure(s): CARDIOVERSION (N/A) as a surgical intervention .  The patient's history has been reviewed, patient examined, no change in status, stable for surgery.  I have reviewed the patient's chart and labs.  Questions were answered to the patient's satisfaction.     [employer]

## 2017-07-15 NOTE — Progress Notes (Signed)
Progress Note  Patient Name: Donald [name] Date of Encounter: 07/15/2017  Primary Cardiologist: Dr, [name]  Subjective   He continues to feel SOB, increased abdominal girth.  Inpatient Medications    Scheduled Meds: . amiodarone  200 mg Oral BID  . apixaban  2.5 mg Oral BID  . bisoprolol  5 mg Oral Daily  . cholecalciferol  2,000 Units Oral Daily  . fenofibrate  160 mg Oral Daily  . furosemide  40 mg Intravenous BID  . latanoprost  1 drop Left Eye QHS  . potassium chloride  20 mEq Oral Daily  . pravastatin  40 mg Oral QHS  . sodium chloride flush  3 mL Intravenous Q12H  . sodium chloride flush  3 mL Intravenous Q12H  . tamsulosin  0.4 mg Oral QPM   Continuous Infusions: . sodium chloride    . sodium chloride 500 mL (07/15/17 0856)   PRN Meds: sodium chloride, acetaminophen, albuterol, fluticasone furoate-vilanterol, sodium chloride flush, sodium chloride flush   Vital Signs    Vitals:   07/15/17 0704 07/15/17 0853 07/15/17 1004 07/15/17 1010  BP: 105/81 103/69 93/68 108/75  Pulse: 74  (!) 55 60  Resp: 18 17 15 17   Temp: 98 F (36.7 C) 97.7 F (36.5 C) (!) 97.5 F (36.4 C)   TempSrc: Oral Oral Oral   SpO2: 97% 99% 95% 97%  Weight: 186 lb 6.4 oz (84.6 kg)     Height:        Intake/Output Summary (Last 24 hours) at 07/15/2017 1027 Last data filed at 07/15/2017 1011 Gross per 24 hour  Intake 880 ml  Output 1100 ml  Net -220 ml   Filed Weights   07/13/17 1754 07/14/17 0530 07/15/17 0704  Weight: 186 lb 8 oz (84.6 kg) 186 lb 11.7 oz (84.7 kg) 186 lb 6.4 oz (84.6 kg)   Physical Exam   General: Well developed, well nourished, NAD Skin: Warm, dry, intact  Head: Normocephalic, atraumatic,  clear, moist mucus membranes. Neck: Negative for carotid bruits. No JVD Lungs: BLL mild crackles. No wheezes. Breathing is mildly labored with communication  Cardiovascular: Irregularly irregular with S1 S2. No murmurs, rubs or gallops MSK: Strength and tone  appear normal for age. 5/5 in all extremities Extremities: No edema. No clubbing or cyanosis. DP/PT pulses 2+ bilaterally Neuro: Alert and oriented. No focal deficits. No facial asymmetry. MAE spontaneously. Psych: Responds to questions appropriately with normal affect.    Labs    Chemistry Recent Labs  Lab 07/13/17 1609 07/14/17 0445 07/15/17 0424  NA 140 139 140  K 4.8 4.1 4.1  CL 104 105 106  CO2 28 23 26   GLUCOSE 180* 137* 113*  BUN 39* 40* 40*  CREATININE 2.02* 2.05* 2.08*  CALCIUM 9.3 9.0 9.0  GFRNONAA 28* 27* 27*  GFRAA 32* 32* 31*  ANIONGAP 8 11 8     Hematology Recent Labs  Lab 07/13/17 1609 07/14/17 0445  WBC 4.6 4.9  RBC 4.21* 3.95*  HGB 13.0 12.0*  HCT 39.5 36.7*  MCV 93.8 92.9  MCH 30.9 30.4  MCHC 32.9 32.7  RDW 15.3 15.0  PLT 180 179    Cardiac EnzymesNo results for input(s): TROPONINI in the last 168 hours. No results for input(s): TROPIPOC in the last 168 hours.   BNP Recent Labs  Lab 07/14/17 0445  BNP 1,463.4*    DDimer No results for input(s): DDIMER in the last 168 hours.   Radiology    Dg Chest  Port 1 View  Result Date: 07/14/2017 CLINICAL DATA:  Chronic shortness of breath EXAM: PORTABLE CHEST 1 VIEW COMPARISON:  06/10/2017 FINDINGS: Cardiac shadow is at the upper limits of normal in size. Post surgical changes are noted and stable. No sizable infiltrate or effusion is noted. No bony abnormality is seen. IMPRESSION: No acute abnormality noted. Electronically Signed   By: Inez Catalina M.D.   On: 07/14/2017 12:24   Telemetry    Atrial fibrillation - Personally Reviewed  ECG    No new tracing as of 07/15/17 - Personally Reviewed  Cardiac Studies   Echocardiogram 06/13/17: Study Conclusions  - Left ventricle: The cavity size was normal. Wall thickness was increased in a pattern of mild LVH. Systolic function was mildly to moderately reduced. The estimated ejection fraction was in the range of 40% to 45%. Akinesis of the  apical myocardium. - Ventricular septum: The contour showed systolic flattening. These changes are consistent with RV volume and pressure overload. - Aortic valve: Trileaflet; normal thickness, mildly calcified leaflets. Transvalvular velocity was increased. There was mild stenosis. Valve area (VTI): 1.28 cm^2. Valve area (Vmax): 1.35 cm^2. Valve area (Vmean): 1.21 cm^2. - Aortic root: The aortic root was mildly dilated. - Mitral valve: Mildly calcified annulus. There was moderate to severe regurgitation. - Left atrium: The atrium was severely dilated. - Right ventricle: The cavity size was dilated. Wall thickness was normal. - Right atrium: The atrium was severely dilated. - Tricuspid valve: There was moderate regurgitation. - Pulmonary arteries: Systolic pressure could not be accurately estimated. Suspect it is higher because of other characteristics. PA peak pressure: 33 mm Hg (S).  12/03/16 Stress test:  Defect in distal anterior, distal inferior and apical walls consistent with small region of scar. Inferior thinning consistent with soft tissue attenuation and/or scar No ischemia.  Nuclear stress EF: 46%.  This is an intermediate risk study.  Patient Profile     84 y.o. male PMH of CAD s/p CABG1998, h/o ICM with improved EF, prior CVA, prior mural thrombus on chronic coumadin Rx, prostate CA, and h/o GI bleed. He had a cardiac catheterization in June 2013 that showed 100%ostLAD, distal LAD filled with RV branch off of RCA, PLA possibly occluded by most distal RCA appears to be filled from LIMA collateral, LIMA patent but does not supply distal LAD. Medical therapy was recommended. Last echocardiogram obtained on 11/13/2013 showed EF 50-55%, akinesis of the entire apical myocardium, grade 1 DD, PA peak pressure 56 mmHg. Ejection fraction has improved from the previous 40-45% in 2007. He is intolerant to all beta blockers.Myoview obtained on 11/02/2016 showed EF  46%, defect in the distal anterior, distal inferior, apical wall consistent with small regional scar, inferior thinning consistent with soft tissue attenuation. Otherwise no ischemia.  Pt presented from the AF clinic with weight gain and worsening SOB which had been ongoing since early 05/2017.   Assessment & Plan    1.Persistent atrial fibrillation: -DCCV in 05/2017 initially successful but converted back to AF within a few days -Left and right atrium severely enlarged per echocardiogram making if potentially difficult to restore and maintain NSR -On amiodarone 200 mg PO BID -Initially scheduled for OP DCCV with Dr. [name] on 4/25>>however this was scheduled as inpatient for 07/15/17 -Has been taking Eliquis 2.5mg  PO BID with no reported missed doses  -Chadsvasc score of at least 4  2. Acute on chronic combined systolic and diastolic heart failure: -Pt with daily weight log at bedside. Reports that he  has been SOB since his dx of AF in March 2019. He had a recent hospitalization in early April in which he states he has not been back to his baseline.  -Weight is now up 6lbs from baseline to 186lb with no change since admission (186lb today) -BNP 1,463 07/14/17 -BMET showscreatinine up to 2.08 today, increased from yesterday at 2.05 -Cannot add ACE-I or ARB secondary to worsening renal function -One time Lasix dose of 40 mg IV given on admission, K+ supplementation (4.1 today) -Now on Lasix 40mg  IV BID -Bisoprolol to 5 mg daily -Will order CXR with no effusion    3. Coronary artery disease involving coronary bypass graft of native heart without angina pectoris: -Denies recent chest pain and other anginal symptoms -BB, statin  4. Mitral valve insufficiency, unspecified etiology: -Per echocardiogram on 05/2017 -No obvious murmur -Could be a contributing factor for his symptoms   5. Chronic kidney disease stage II: -Creatinine today 2.08 today  -Last hospitalization  1.36>1.32>1.63 (07/05/17)>2.02 on admission  -Will need to monitor this closely given that he needs diuresis secondary to elevated BNP and SOB but will need to be cautious during this process    Signed, Kathyrn Drown NP-C [place] Pager: [phone] 07/15/2017, 10:27 AM     For questions or updates, please contact   Please consult [email or web address] for contact info under Cardiology/STEMI.  The patient was seen, examined and discussed with Kathyrn Drown, NP  and I agree with the above.   He continues to feel SOB with minimal exertion and at rest, we will continue lasix 40 mg iv BID, and challenge with metolazone 2.5 mg x 1 today, BNP 1500, Crea worsening.2.1 today, from baseline 1.4. S/P successful DCCV today.  Nuclear stress test in 11/2017 - old infarction but no significant ischemia.  Ena Dawley, MD 07/15/2017     Ena Dawley, MD 07/15/2017

## 2017-07-15 NOTE — CV Procedure (Signed)
    Electrical Cardioversion Procedure Note Donald [name] [id number] 1927-01-30  Procedure: Electrical Cardioversion Indications:  Atrial Fibrillation  Time Out: Verified patient identification, verified procedure,medications/allergies/relevent history reviewed, required imaging and test results available.  Performed  Procedure Details  The patient was NPO after midnight. Anesthesia was administered at the beside  by Dr.Jackson with 40mg  of propofol.  Cardioversion was performed with synchronized biphasic defibrillation via AP pads with 120, 150 joules.  2 attempt(s) were performed.  The patient converted to normal sinus rhythm. The patient tolerated the procedure well   IMPRESSION:  Successful cardioversion of atrial fibrillation. Hypotension resolved post sedation.    Donald [name] 07/15/2017, 10:04 AM

## 2017-07-15 NOTE — Anesthesia Preprocedure Evaluation (Signed)
Anesthesia Evaluation  Patient identified by MRN, date of birth, ID band Patient awake    Reviewed: Allergy & Precautions, H&P , NPO status , Patient's Chart, lab work & pertinent test results, reviewed documented beta blocker date and time   Airway Mallampati: III  TM Distance: >3 FB Neck ROM: Full    Dental no notable dental hx. (+) Teeth Intact, Dental Advisory Given   Pulmonary asthma ,    Pulmonary exam normal breath sounds clear to auscultation       Cardiovascular + CAD, + CABG and +CHF  + dysrhythmias Atrial Fibrillation  Rhythm:Irregular Rate:Normal     Neuro/Psych negative neurological ROS  negative psych ROS   GI/Hepatic negative GI ROS, Neg liver ROS,   Endo/Other  negative endocrine ROS  Renal/GU Renal InsufficiencyRenal disease  negative genitourinary   Musculoskeletal  (+) Arthritis , Osteoarthritis,    Abdominal   Peds  Hematology negative hematology ROS (+) anemia ,   Anesthesia Other Findings 45% EF  Reproductive/Obstetrics negative OB ROS                             Anesthesia Physical  Anesthesia Plan  ASA: III  Anesthesia Plan: General   Post-op Pain Management:    Induction: Intravenous  PONV Risk Score and Plan: 2 and Treatment may vary due to age or medical condition  Airway Management Planned: Mask  Additional Equipment:   Intra-op Plan:   Post-operative Plan:   Informed Consent: I have reviewed the patients History and Physical, chart, labs and discussed the procedure including the risks, benefits and alternatives for the proposed anesthesia with the patient or authorized representative who has indicated his/her understanding and acceptance.   Dental advisory given  Plan Discussed with: CRNA  Anesthesia Plan Comments:         Anesthesia Quick Evaluation

## 2017-07-15 NOTE — Anesthesia Postprocedure Evaluation (Signed)
Anesthesia Post Note  Patient: Donald [name]  Procedure(s) Performed: CARDIOVERSION (N/A )     Patient location during evaluation: PACU Anesthesia Type: General Level of consciousness: awake and alert Pain management: pain level controlled Vital Signs Assessment: post-procedure vital signs reviewed and stable Respiratory status: spontaneous breathing, nonlabored ventilation, respiratory function stable and patient connected to nasal cannula oxygen Cardiovascular status: blood pressure returned to baseline and stable Postop Assessment: no apparent nausea or vomiting Anesthetic complications: no    Last Vitals:  Vitals:   07/15/17 1130 07/15/17 1222  BP: 99/81 100/78  Pulse: (!) 55 (!) 58  Resp: 16 20  Temp: 37 C   SpO2: 100% 99%    Last Pain:  Vitals:   07/15/17 1137  TempSrc:   PainSc: 0-No pain                 Vertie Dibbern EDWARD

## 2017-07-16 ENCOUNTER — Inpatient Hospital Stay (HOSPITAL_COMMUNITY): Payer: Medicare Other

## 2017-07-16 ENCOUNTER — Encounter (HOSPITAL_COMMUNITY): Payer: Self-pay | Admitting: Cardiology

## 2017-07-16 DIAGNOSIS — N184 Chronic kidney disease, stage 4 (severe): Secondary | ICD-10-CM

## 2017-07-16 DIAGNOSIS — N171 Acute kidney failure with acute cortical necrosis: Secondary | ICD-10-CM

## 2017-07-16 LAB — BASIC METABOLIC PANEL
Anion gap: 11 (ref 5–15)
BUN: 43 mg/dL — ABNORMAL HIGH (ref 6–20)
CALCIUM: 9 mg/dL (ref 8.9–10.3)
CO2: 26 mmol/L (ref 22–32)
CREATININE: 2.11 mg/dL — AB (ref 0.61–1.24)
Chloride: 102 mmol/L (ref 101–111)
GFR calc Af Amer: 30 mL/min — ABNORMAL LOW (ref >60)
GFR calc non Af Amer: 26 mL/min — ABNORMAL LOW (ref >60)
GLUCOSE: 114 mg/dL — AB (ref 65–99)
Potassium: 4.4 mmol/L (ref 3.5–5.1)
Sodium: 139 mmol/L (ref 135–145)

## 2017-07-16 LAB — LIPID PANEL
Cholesterol: 60 mg/dL (ref 0–200)
HDL: 19 mg/dL — ABNORMAL LOW (ref >40)
LDL Cholesterol: 31 mg/dL (ref 0–99)
Total CHOL/HDL Ratio: 3.2 RATIO
Triglycerides: 51 mg/dL (ref <150)
VLDL: 10 mg/dL (ref 0–40)

## 2017-07-16 NOTE — Care Management Important Message (Signed)
Important Message  Patient Details  Name: Donald [name] MRN: [id number] Date of Birth: 29-Aug-1926   Medicare Important Message Given:  Yes    [name] 07/16/2017, 12:04 PM

## 2017-07-16 NOTE — Progress Notes (Signed)
Progress Note  Patient Name: Donald [name] Date of Encounter: 07/16/2017  Primary Cardiologist: Dr. [name]  Subjective   The patient feels better today, more energy, less SOB.  Inpatient Medications    Scheduled Meds: . amiodarone  200 mg Oral BID  . apixaban  2.5 mg Oral BID  . bisoprolol  5 mg Oral Daily  . cholecalciferol  2,000 Units Oral Daily  . fenofibrate  160 mg Oral Daily  . furosemide  40 mg Intravenous BID  . latanoprost  1 drop Left Eye QHS  . metolazone  2.5 mg Oral Daily  . potassium chloride  20 mEq Oral Daily  . pravastatin  40 mg Oral QHS  . sodium chloride flush  3 mL Intravenous Q12H  . sodium chloride flush  3 mL Intravenous Q12H  . tamsulosin  0.4 mg Oral QPM   Continuous Infusions: . sodium chloride    . sodium chloride 500 mL (07/15/17 0856)   PRN Meds: sodium chloride, acetaminophen, albuterol, fluticasone furoate-vilanterol, hydrocortisone cream, sodium chloride flush, sodium chloride flush   Vital Signs    Vitals:   07/15/17 1130 07/15/17 1222 07/15/17 2158 07/16/17 0545  BP: 99/81 100/78 120/83 105/68  Pulse: (!) 55 (!) 58 65 (!) 58  Resp: 16 20 20 20   Temp: 98.6 F (37 C)  (!) 97.5 F (36.4 C) (!) 97.3 F (36.3 C)  TempSrc: Oral  Oral Oral  SpO2: 100% 99% 96% 96%  Weight:    187 lb 9.6 oz (85.1 kg)  Height:        Intake/Output Summary (Last 24 hours) at 07/16/2017 0817 Last data filed at 07/15/2017 2250 Gross per 24 hour  Intake 880 ml  Output 975 ml  Net -95 ml   Filed Weights   07/14/17 0530 07/15/17 0704 07/16/17 0545  Weight: 186 lb 11.7 oz (84.7 kg) 186 lb 6.4 oz (84.6 kg) 187 lb 9.6 oz (85.1 kg)    Physical Exam   General: Well developed, well nourished, NAD Skin: Warm, dry, intact  Head: Normocephalic, atraumatic, sclera non-icteric, no xanthomas, clear, moist mucus membranes. Neck: Negative for carotid bruits. No JVD Lungs:Clear to ausculation bilaterally. No wheezes, rales, or rhonchi. Breathing is  unlabored. Cardiovascular: RRR with S1 S2. No murmurs, rubs, gallops, or LV heave appreciated. Abdomen: Soft, non-tender, non-distended with normoactive bowel sounds. No hepatomegaly, No rebound/guarding. No obvious abdominal masses. MSK: Strength and tone appear normal for age. 5/5 in all extremities Extremities: No edema. No clubbing or cyanosis. DP/PT pulses 2+ bilaterally Neuro: Alert and oriented. No focal deficits. No facial asymmetry. MAE spontaneously. Psych: Responds to questions appropriately with normal affect.    Labs    Chemistry Recent Labs  Lab 07/14/17 0445 07/15/17 0424 07/16/17 0542  NA 139 140 139  K 4.1 4.1 4.4  CL 105 106 102  CO2 23 26 26   GLUCOSE 137* 113* 114*  BUN 40* 40* 43*  CREATININE 2.05* 2.08* 2.11*  CALCIUM 9.0 9.0 9.0  GFRNONAA 27* 27* 26*  GFRAA 32* 31* 30*  ANIONGAP 11 8 11      Hematology Recent Labs  Lab 07/13/17 1609 07/14/17 0445  WBC 4.6 4.9  RBC 4.21* 3.95*  HGB 13.0 12.0*  HCT 39.5 36.7*  MCV 93.8 92.9  MCH 30.9 30.4  MCHC 32.9 32.7  RDW 15.3 15.0  PLT 180 179    Cardiac EnzymesNo results for input(s): TROPONINI in the last 168 hours. No results for input(s): TROPIPOC in the last  168 hours.   BNP Recent Labs  Lab 07/14/17 0445  BNP 1,463.4*     DDimer No results for input(s): DDIMER in the last 168 hours.   Radiology    Dg Chest Port 1 View  Result Date: 07/14/2017 CLINICAL DATA:  Chronic shortness of breath EXAM: PORTABLE CHEST 1 VIEW COMPARISON:  06/10/2017 FINDINGS: Cardiac shadow is at the upper limits of normal in size. Post surgical changes are noted and stable. No sizable infiltrate or effusion is noted. No bony abnormality is seen. IMPRESSION: No acute abnormality noted. Electronically Signed   By: Inez Catalina M.D.   On: 07/14/2017 12:24    Telemetry    SR - Personally Reviewed  ECG    SR, RBBB - Personally Reviewed  Cardiac Studies   Echocardiogram 06/13/17: Study Conclusions  - Left  ventricle: The cavity size was normal. Wall thickness was increased in a pattern of mild LVH. Systolic function was mildly to moderately reduced. The estimated ejection fraction was in the range of 40% to 45%. Akinesis of the apical myocardium. - Ventricular septum: The contour showed systolic flattening. These changes are consistent with RV volume and pressure overload. - Aortic valve: Trileaflet; normal thickness, mildly calcified leaflets. Transvalvular velocity was increased. There was mild stenosis. Valve area (VTI): 1.28 cm^2. Valve area (Vmax): 1.35 cm^2. Valve area (Vmean): 1.21 cm^2. - Aortic root: The aortic root was mildly dilated. - Mitral valve: Mildly calcified annulus. There was moderate to severe regurgitation. - Left atrium: The atrium was severely dilated. - Right ventricle: The cavity size was dilated. Wall thickness was normal. - Right atrium: The atrium was severely dilated. - Tricuspid valve: There was moderate regurgitation. - Pulmonary arteries: Systolic pressure could not be accurately estimated. Suspect it is higher because of other characteristics. PA peak pressure: 33 mm Hg (S).  12/03/16 Stress test:  Defect in distal anterior, distal inferior and apical walls consistent with small region of scar. Inferior thinning consistent with soft tissue attenuation and/or scar No ischemia.  Nuclear stress EF: 46%.  This is an intermediate risk study.  Patient Profile     84 y.o. male PMH of CAD s/p CABG1998, h/o ICM with improved EF, prior CVA, prior mural thrombus on chronic coumadin Rx, prostate CA, and h/o GI bleed.He had a cardiac catheterization in June 2013 that showed 100%ostLAD, distal LAD filled with RV branch off of RCA, PLA possibly occluded by most distal RCA appears to be filled from LIMA collateral, LIMA patent but does not supply distal LAD. Medical therapy was recommended. Last echocardiogram obtained on 11/13/2013 showed EF  50-55%, akinesis of the entire apical myocardium, grade 1 DD, PA peak pressure 56 mmHg. Ejection fraction has improved from the previous 40-45% in 2007. He is intolerant to all beta blockers.Myoview obtained on 11/02/2016 showed EF 46%, defect in the distal anterior, distal inferior, apical wall consistent with small regional scar, inferior thinning consistent with soft tissue attenuation. Otherwise no ischemia.  Pt presented from the AF clinic with weight gain and worsening SOB which had been ongoing since early 05/2017.   Assessment & Plan    1.Persistent atrialfibrillation: -DCCV in 03/2019initially successful but converted back to AF within a few days -Leftand rightatrium severely enlargedper echocardiogram making if potentially difficultto restore and maintain NSR -Onamiodarone 200 mgPO BID -Initially scheduled for OP DCCVwith Dr. [name] on 4/25>>however this was scheduled as inpatient for 07/15/17>>successful DCCV with 120, 150J with conversion to NSR  -Has been taking Eliquis 2.5mg  PO BID with  no reported missed doses  -Chadsvasc score of at least 4  2. Acuteon chroniccombined systolic anddiastolicheart failure: -Pt with daily weight log at bedside. Reports that he has been SOB since his dx of AF in March 2019. He had a recent hospitalization in early April in which he states he has not been back to his baseline.  -Weight was up 6lbs from baseline to 186lb with no change since admission (186lb today) -BNP 1,463 07/14/17 -BMETshowscreatinineup to 2.11 today, increased from yesterday at 2.08 -Cannot add ACE-I or ARB secondary to worsening renal function -One time Lasix dose of 40 mg IV given on admission, K+ supplementation (4.1 today) -Now on Lasix 40mg  IV BID>>>metolazone 2.5mg  PO QD added 07/15/17 -Bisoprolol to 5 mg daily -Weight, 187lb, up 1lb from admission despite the addition of IV Lasix and metolazone yesterday  -I&O, net negative 387ml since admission with  997ml total 24H output   3.Coronary artery disease involving coronary bypass graft of native heart without angina pectoris: -Denies recent chest pain and other anginal symptoms -BB, statin  4.Mitral valve insufficiency, unspecified etiology: -Per echocardiogram on 05/2017 -No obvious murmur -Could be a contributing factor for his symptoms   5. Chronic kidney disease stage II: -Creatinine today 2.11 today  -Last hospitalization 1.36>1.32>1.63 (07/05/17)>2.02 on admission. Now up to 2.11 -Will need to monitor this closely given that he needs diuresis secondary to elevated BNP and SOB but will need to be cautious during this process   Signed, Kathyrn Drown NP-C [place] Pager: [phone] 07/16/2017, 8:17 AM     For questions or updates, please contact   Please consult [email or web address] for contact info under Cardiology/STEMI.  The patient was seen, examined and discussed with Kathyrn Drown, NP  and I agree with the above.   S/P successful DCCV yesterday, he remains in [place], he has more energy today and feels less SOB. However minimal diuresis despite lasix and metolazone, Crea up to 2.1 - normal 4 months ago. I will continue the same diuretic regimen for 1 more day and consult nephrology. I will d/c fenofibrate and statins, lipids very low.  Ena Dawley, MD 07/16/2017

## 2017-07-16 NOTE — Progress Notes (Signed)
4458  Patient in SB/NSR. HR 55 No complaints at this time. Will continue to monitor.

## 2017-07-16 NOTE — Consult Note (Signed)
[facility]  HISTORY AND PHYSICAL  Donald [name] is an 84 y.o. male.    Chief Complaint: Afib, SOB, weight gain  HPI: Pt is an 64M with a PMH significant for HTN, HLD, Afib s/p DCCV x 2, nephrolithiasis with lithotripsy of R UPJ stone 03/2017, moderate to severe MR, and CHF who is now seen in consultation at the request of Dr. Meda Coffee for eval and recs re: acute on chronic CKD and some vol overload.  Pt was recently admitted in March for Afib and fluid overload and underwent DC-CV which was temporarily successful.  Pt and granddaughter think that EDW is between 174-177 lbs and not more than 180 lbs.  After discharge, his lowest weight was 179 lbs on PO Lasix.  He started gaining fluid about a week ago and last Sunday he noted that his PO Lasix wasn't really getting him the amount of UOP he was expecting.  He was seen in Afib clinic this week, was noted to be vol overloaded and back in Afib, and underwent repeat DC-CV yesterday with return to NSR.    He is currently being diuresed with Lasix 40 IV BID and metolazone 2.5 mg daily.  He is on amio and Eliquis.  Bisoprolol is being held due to soft-ish BP in the 90s-110s.    He is feeling OK but has abd distention and is about 7-8 lbs up from where he should be.   He is still making fairly good urine.  Denies hematuria or flank pain.  Fibrate and statin have been stopped.  Cr trend as follows: 06/08/17 0.8--> 3/28 1.36--> 4/8 1.63 4/16 2.02--> 4/19 2.11.    PMH: Past Medical History:  Diagnosis Date  . Asthma    with worsening with beta blockade  . CAD (coronary artery disease)   . CVA (cerebral infarction) 2012   tia, mild no residual defecits  . GI bleed 8 yrs ago   due to doll fully vessel which was clipped  . Glaucoma    excellent cataracts  . History of kidney stones   . Ischemic cardiomyopathy   . Moderate to severe mitral regurgitation 06/16/2017  . Nephrolithiasis   . Post-infarction apical thrombus ([place])   .  Prostate cancer ([place]) 2012  . Radiation proctitis Feb 2015   treated with APC  . Tubular adenoma 04/2013   Dr. Hilarie Fredrickson   PSH: Past Surgical History:  Procedure Laterality Date  . cardiac stents  2003  . CARDIOVERSION N/A 06/15/2017   Procedure: CARDIOVERSION;  Surgeon: Acie Fredrickson Wonda Cheng, MD;  Location: [facility] ENDOSCOPY;  Service: Cardiovascular;  Laterality: N/A;  . CARDIOVERSION N/A 07/15/2017   Procedure: CARDIOVERSION;  Surgeon: Jerline Pain, MD;  Location: [facility] ENDOSCOPY;  Service: Cardiovascular;  Laterality: N/A;  . COLONOSCOPY WITH PROPOFOL N/A 05/05/2013   Procedure: COLONOSCOPY WITH PROPOFOL;  Surgeon: Jerene Bears, MD;  Location: [facility] ENDOSCOPY;  Service: Gastroenterology;  Laterality: N/A;  . CORONARY ARTERY BYPASS GRAFT  1998   LIMA to the LAD and saphenous vein graft tothe diagonal  . EXTRACORPOREAL SHOCK WAVE LITHOTRIPSY Right 04/29/2017   Procedure: RIGHT EXTRACORPOREAL SHOCK WAVE LITHOTRIPSY (ESWL);  Surgeon: Alexis Frock, MD;  Location: [facility] ORS;  Service: Urology;  Laterality: Right;  . history of radiation treatment  2012   x 40 treatments done  . KNEE ARTHROSCOPY  2016 or 2017   Dr Gladstone Lighter ;   . PARTIAL KNEE ARTHROPLASTY Right 01/13/2017   Procedure: UNICOMPARTMENTAL RIGHT KNEE;  Surgeon: Gaynelle Arabian, MD;  Location: [facility] ORS;  Service: Orthopedics;  Laterality: Right;  with block    Medications:   Scheduled: . amiodarone  200 mg Oral BID  . apixaban  2.5 mg Oral BID  . bisoprolol  5 mg Oral Daily  . cholecalciferol  2,000 Units Oral Daily  . furosemide  40 mg Intravenous BID  . latanoprost  1 drop Left Eye QHS  . metolazone  2.5 mg Oral Daily  . potassium chloride  20 mEq Oral Daily  . sodium chloride flush  3 mL Intravenous Q12H  . sodium chloride flush  3 mL Intravenous Q12H  . tamsulosin  0.4 mg Oral QPM    Medications Prior to Admission  Medication Sig Dispense Refill  . amiodarone (PACERONE) 200 MG tablet Take 1 tablet (200 mg total) by mouth 2 (two) times  daily. 60 tablet 0  . apixaban (ELIQUIS) 5 MG TABS tablet Take 5 mg by mouth 2 (two) times daily.    . bisoprolol (ZEBETA) 5 MG tablet Take 1 tablet (5 mg total) by mouth daily. 30 tablet 6  . Cholecalciferol (VITAMIN D) 2000 UNITS tablet Take 2,000 Units by mouth daily.    . fenofibrate micronized (LOFIBRA) 200 MG capsule Take 1 capsule (200 mg total) by mouth daily. 90 capsule 3  . Fluticasone Furoate-Vilanterol (BREO ELLIPTA) 100-25 MCG/INH AEPB Inhale 1 puff into the lungs daily as needed (shortness of breath).     . furosemide (LASIX) 40 MG tablet Take 1.5 tablets (60 mg total) by mouth daily. 135 tablet 2  . hydrocortisone cream 1 % Apply 1 application topically 3 (three) times daily as needed for itching (skin irritation). 30 g 0  . latanoprost (XALATAN) 0.005 % ophthalmic solution Place 1 drop into the left eye at bedtime.     Marland Kitchen loratadine (CLARITIN) 10 MG tablet Take 10 mg by mouth daily.    . Multiple Vitamin (MULTIVITAMIN WITH MINERALS) TABS tablet Take 1 tablet by mouth daily.    . potassium chloride 20 MEQ TBCR Take 20 mEq by mouth daily. 90 tablet 1  . pravastatin (PRAVACHOL) 40 MG tablet Take 1 tablet (40 mg total) by mouth at bedtime. 90 tablet 4  . Tamsulosin HCl (FLOMAX) 0.4 MG CAPS Take 0.4 mg by mouth every evening.       ALLERGIES:   Allergies  Allergen Reactions  . Keflex [Cephalexin] Nausea And Vomiting and Other (See Comments)    'Killed all GI enzymes" (also) and patient prefers to not take this  . Morphine Nausea Only    FAM HX: Family History  Problem Relation Age of Onset  . Heart attack Father   . Heart disease Mother   . Stomach cancer Mother     Social History:   reports that he has never smoked. He has never used smokeless tobacco. He reports that he drinks alcohol. He reports that he does not use drugs.  ROS: ROS: all other systems reviewed except as per HPI  Blood pressure (!) 123/111, pulse (!) 59, temperature (!) 97.4 F (36.3 C),  temperature source Oral, resp. rate 20, height 5' 9" (1.753 m), weight 85.1 kg (187 lb 9.6 oz), SpO2 96 %. PHYSICAL EXAM: Physical Exam  GEN older gentleman, NAD HEENT EOMI PERRL NECK no JVD upright PULM normal WOB, bilateral bibasilar crackles CV RRR II/VI systolic murmur at apex ABD soft, mildly distended, NABS EXT no LE edema NEURO AAO x 3 nonfocal SKIN warm and dry   Results for orders placed or performed  during the hospital encounter of 07/13/17 (from the past 48 hour(s))  Basic metabolic panel     Status: Abnormal   Collection Time: 07/15/17  4:24 AM  Result Value Ref Range   Sodium 140 135 - 145 mmol/L   Potassium 4.1 3.5 - 5.1 mmol/L   Chloride 106 101 - 111 mmol/L   CO2 26 22 - 32 mmol/L   Glucose, Bld 113 (H) 65 - 99 mg/dL   BUN 40 (H) 6 - 20 mg/dL   Creatinine, Ser 2.08 (H) 0.61 - 1.24 mg/dL   Calcium 9.0 8.9 - 10.3 mg/dL   GFR calc non Af Amer 27 (L) >60 mL/min   GFR calc Af Amer 31 (L) >60 mL/min    Comment: (NOTE) The eGFR has been calculated using the CKD EPI equation. This calculation has not been validated in all clinical situations. eGFR's persistently <60 mL/min signify possible Chronic Kidney Disease.    Anion gap 8 5 - 15    Comment: Performed at [facility] [address] [place], [place] [address]  Hemoglobin A1c     Status: Abnormal   Collection Time: 07/15/17  4:24 AM  Result Value Ref Range   Hgb A1c MFr Bld 5.9 (H) 4.8 - 5.6 %    Comment: (NOTE) Pre diabetes:          5.7%-6.4% Diabetes:              >6.4% Glycemic control for   <7.0% adults with diabetes    Mean Plasma Glucose 122.63 mg/dL    Comment: Performed at [facility] [address] [place], [place] [address]  Lipid panel     Status: Abnormal   Collection Time: 07/15/17 11:29 PM  Result Value Ref Range   Cholesterol 60 0 - 200 mg/dL   Triglycerides 51 <150 mg/dL   HDL 19 (L) >40 mg/dL   Total CHOL/HDL Ratio 3.2 RATIO   VLDL 10 0 - 40 mg/dL   LDL Cholesterol  31 0 - 99 mg/dL    Comment:        Total Cholesterol/HDL:CHD Risk Coronary Heart Disease Risk Table                     Men   Women  1/2 Average Risk   3.4   3.3  Average Risk       5.0   4.4  2 X Average Risk   9.6   7.1  3 X Average Risk  23.4   11.0        Use the calculated Patient Ratio above and the CHD Risk Table to determine the patient's CHD Risk.        ATP III CLASSIFICATION (LDL):  <100     mg/dL   Optimal  100-129  mg/dL   Near or Above                    Optimal  130-159  mg/dL   Borderline  160-189  mg/dL   High  >190     mg/dL   Very High Performed at [facility] [address] [place], [place] [address]   Basic metabolic panel     Status: Abnormal   Collection Time: 07/16/17  5:42 AM  Result Value Ref Range   Sodium 139 135 - 145 mmol/L   Potassium 4.4 3.5 - 5.1 mmol/L   Chloride 102 101 - 111 mmol/L  CO2 26 22 - 32 mmol/L   Glucose, Bld 114 (H) 65 - 99 mg/dL   BUN 43 (H) 6 - 20 mg/dL   Creatinine, Ser 2.11 (H) 0.61 - 1.24 mg/dL   Calcium 9.0 8.9 - 10.3 mg/dL   GFR calc non Af Amer 26 (L) >60 mL/min   GFR calc Af Amer 30 (L) >60 mL/min    Comment: (NOTE) The eGFR has been calculated using the CKD EPI equation. This calculation has not been validated in all clinical situations. eGFR's persistently <60 mL/min signify possible Chronic Kidney Disease.    Anion gap 11 5 - 15    Comment: Performed at [facility] [address] [place], [place] [address]    No results found.  Assessment/Plan  1.  AKI on CKD III: It appears pt has had a gradual rise in Cr since 05/2017 when his Afib problems really started.  Recurrent Afib could cause decreased effective arterial blood flow and repeated AKI.  Pt would have a little more difficulty with diuresis in the setting of said AKI.  He does seem a little vol overload so if a more robust diuresis is desired would increase Lasix to 80 IV BID.  Having the history of obstructing stone--> will repeat  renal US.  I'll do a CK too in the setting of concomitant statin and fibrate use.  2.  Afib: s/p DC-CV 4/18.  In NSR  3.  Acute on chronic systolic CHF, LO-75-64%: Lasix recs as above  4.  Mod-severe MR: seen on TTE 3/19.    5.  Dispo: pending workup  UPTON, ELIZABETH 07/16/2017, 4:43 PM

## 2017-07-17 DIAGNOSIS — I4891 Unspecified atrial fibrillation: Secondary | ICD-10-CM

## 2017-07-17 LAB — BASIC METABOLIC PANEL
ANION GAP: 10 (ref 5–15)
BUN: 44 mg/dL — AB (ref 6–20)
CALCIUM: 8.9 mg/dL (ref 8.9–10.3)
CO2: 29 mmol/L (ref 22–32)
Chloride: 98 mmol/L — ABNORMAL LOW (ref 101–111)
Creatinine, Ser: 1.96 mg/dL — ABNORMAL HIGH (ref 0.61–1.24)
GFR calc Af Amer: 33 mL/min — ABNORMAL LOW (ref >60)
GFR, EST NON AFRICAN AMERICAN: 29 mL/min — AB (ref >60)
GLUCOSE: 85 mg/dL (ref 65–99)
Potassium: 3.3 mmol/L — ABNORMAL LOW (ref 3.5–5.1)
Sodium: 137 mmol/L (ref 135–145)

## 2017-07-17 LAB — CK: Total CK: 91 U/L (ref 49–397)

## 2017-07-17 MED ORDER — AMIODARONE HCL 200 MG PO TABS
400.0000 mg | ORAL_TABLET | Freq: Two times a day (BID) | ORAL | Status: DC
  Administered 2017-07-17 – 2017-07-22 (×10): 400 mg via ORAL
  Filled 2017-07-17 (×11): qty 2

## 2017-07-17 MED ORDER — RANOLAZINE ER 500 MG PO TB12
500.0000 mg | ORAL_TABLET | Freq: Two times a day (BID) | ORAL | Status: DC
  Administered 2017-07-17 – 2017-07-22 (×10): 500 mg via ORAL
  Filled 2017-07-17 (×10): qty 1

## 2017-07-17 NOTE — Progress Notes (Addendum)
Progress Note  Patient Name: Donald [name] Date of Encounter: 07/17/2017  Primary Cardiologist: [name]  Subjective   Ongoing SOB  Inpatient Medications    Scheduled Meds: . amiodarone  200 mg Oral BID  . apixaban  2.5 mg Oral BID  . bisoprolol  5 mg Oral Daily  . cholecalciferol  2,000 Units Oral Daily  . furosemide  40 mg Intravenous BID  . latanoprost  1 drop Left Eye QHS  . metolazone  2.5 mg Oral Daily  . potassium chloride  20 mEq Oral Daily  . sodium chloride flush  3 mL Intravenous Q12H  . sodium chloride flush  3 mL Intravenous Q12H  . tamsulosin  0.4 mg Oral QPM   Continuous Infusions: . sodium chloride    . sodium chloride 500 mL (07/15/17 0856)   PRN Meds: sodium chloride, acetaminophen, albuterol, fluticasone furoate-vilanterol, hydrocortisone cream, sodium chloride flush, sodium chloride flush   Vital Signs    Vitals:   07/17/17 0505 07/17/17 0841 07/17/17 0843 07/17/17 0935  BP: (!) 82/40 (!) 87/64 (!) 82/60 (!) 88/71  Pulse: (!) 50 73 80 93  Resp: 18 14    Temp: 97.8 F (36.6 C)     TempSrc: Oral     SpO2: 98% 98%    Weight: 185 lb 8 oz (84.1 kg)     Height:        Intake/Output Summary (Last 24 hours) at 07/17/2017 1056 Last data filed at 07/17/2017 0930 Gross per 24 hour  Intake 870 ml  Output 2000 ml  Net -1130 ml   Filed Weights   07/15/17 0704 07/16/17 0545 07/17/17 0505  Weight: 186 lb 6.4 oz (84.6 kg) 187 lb 9.6 oz (85.1 kg) 185 lb 8 oz (84.1 kg)    Telemetry    afib rate controlled - Personally Reviewed  ECG    na  Physical Exam   GEN: No acute distress.   Neck: elevated JVD Cardiac: irreg, 2/6 systolic murmur apexs rubs, or gallops.  Respiratory: Clear to auscultation bilaterally. GI: Soft, nontender, non-distended  MS: No edema; No deformity. Neuro:  Nonfocal  Psych: Normal affect   Labs    Chemistry Recent Labs  Lab 07/15/17 0424 07/16/17 0542 07/17/17 0525  NA 140 139 137  K 4.1 4.4 3.3*  CL 106  102 98*  CO2 26 26 29   GLUCOSE 113* 114* 85  BUN 40* 43* 44*  CREATININE 2.08* 2.11* 1.96*  CALCIUM 9.0 9.0 8.9  GFRNONAA 27* 26* 29*  GFRAA 31* 30* 33*  ANIONGAP 8 11 10      Hematology Recent Labs  Lab 07/13/17 1609 07/14/17 0445  WBC 4.6 4.9  RBC 4.21* 3.95*  HGB 13.0 12.0*  HCT 39.5 36.7*  MCV 93.8 92.9  MCH 30.9 30.4  MCHC 32.9 32.7  RDW 15.3 15.0  PLT 180 179    Cardiac EnzymesNo results for input(s): TROPONINI in the last 168 hours. No results for input(s): TROPIPOC in the last 168 hours.   BNP Recent Labs  Lab 07/14/17 0445  BNP 1,463.4*     DDimer No results for input(s): DDIMER in the last 168 hours.   Radiology    US Renal  Result Date: 07/16/2017 CLINICAL DATA:  Fluid overload.  Renal insufficiency. EXAM: RENAL / URINARY TRACT ULTRASOUND COMPLETE COMPARISON:  03/12/2017 CT FINDINGS: Right Kidney: Length: 11.0 cm. Normal renal echogenicity and cortical thickness for age. No hydronephrosis. Lower pole 1.6 cm right renal cyst. Left Kidney: Length: 9.8 cm.  Suspect mild left renal atrophy. Normal echogenicity. No hydronephrosis. Bladder: Appears normal for degree of bladder distention. Note is made of bilateral pleural effusions. IMPRESSION: 1. No hydronephrosis or other explanation for acute renal insufficiency. The left kidney may be mildly atrophic. 2. Bilateral pleural effusions. Electronically Signed   By: Abigail Miyamoto M.D.   On: 07/16/2017 19:57    Cardiac Studies     Patient Profile     84 y.o. male PMH of CAD s/p CABG1998, h/o ICM with improved EF, prior CVA, prior mural thrombus on chronic coumadin Rx, prostate CA, and h/o GI bleed.He had a cardiac catheterization in June 2013 that showed 100%ostLAD, distal LAD filled with RV Ziare Cryder off of RCA, PLA possibly occluded by most distal RCA appears to be filled from LIMA collateral, LIMA patent but does not supply distal LAD. Medical therapy was recommended. Last echocardiogram obtained on 11/13/2013  showed EF 50-55%, akinesis of the entire apical myocardium, grade 1 DD, PA peak pressure 56 mmHg. Ejection fraction has improved from the previous 40-45% in 2007. He is intolerant to all beta blockers.Myoview obtained on 11/02/2016 showed EF 46%, defect in the distal anterior, distal inferior, apical wall consistent with small regional scar, inferior thinning consistent with soft tissue attenuation. Otherwise no ischemia.  Pt presented from the AF clinic with weight gain and worsening SOB which had been ongoing since early 05/2017.    Assessment & Plan    1. Afib -DCCV in 03/2019initially successful but converted back to AF within a few days -Leftand rightatrium severely enlargedper echocardiogram making if potentially difficultto restore and maintain NSR -Onamiodarone 200 mgPO BID. Had started oral load in afib clinic.  -Initially scheduled for OP DCCVwith Dr. [name] on 4/25>>however this was scheduled as inpatient for 07/15/17>>successful DCCV with 120, 150J with conversion to NSR   - he is on eliquis 2.5mg  bid, CHADS2Vasc score is 4 - EKG today shows rate controlled afib - he is on bisoprolol 5mg  daily, amio 200mg  bid. Bisoprolol held last few days due to low bp's.    - patient reportedly symptomatic even in rate controlled afib per charting and family report. We will ask EP to evaluate hiim.   2. Acute on chronic combined systolic/diastolic HF - 06/9673 echo LVEF 40-45%, mild AS, mod to severe MR -  Reports that he has been SOB since his dx of AF in March 2019. He had a recent hospitalization in early April in which he states he has not been back to his baseline.  - baseline weight reported around 180 lbs - no ACE/ARB/ARNI due to poor renal function - on lasix 40mg  IV bid, metolazone 2.5mg  daily. I/Os incomplete, negative 1.6 liters yesterday and 2 L since admission. Mild fluctuations in renal function - soft bp's limit diuresis, continue IV lasix alone today without  metolazone.   3. CAD - history of prior CABG  4. CKD II - follow labs - renal following inpatient.   5. Mitral regurgitation -mod to severe by echo For questions or updates, please contact [facility] Please consult [email or web address] for contact info under Cardiology/STEMI.      Merrily Pew, MD  07/17/2017, 10:56 AM

## 2017-07-17 NOTE — Progress Notes (Signed)
Patient refused bed alarm. Will continue to monitor patient. 

## 2017-07-17 NOTE — Progress Notes (Signed)
SBP is on the 80's (see flowsheet) , pt asymptomatic  And no complaints of any pain or discomfort. Will monitor accordingly.

## 2017-07-17 NOTE — Progress Notes (Signed)
Donald [name] Progress Note    Assessment/ Plan:   1.  AKI on CKD III: It appears pt has had a gradual rise in Cr since 05/2017 when his Afib problems really started.  Recurrent Afib could cause decreased effective arterial blood flow and repeated AKI.  Pt would have a little more difficulty with diuresis in the setting of said AKI.  He does seem a little vol overload so if a more robust diuresis is desired would increase Lasix to 80 IV BID.  Having the history of obstructing stone--> will repeat renal US which is without obstruction but does note some echogenicity esp on the L.  CK 91.  I would expect Cr to improve to baseline once forward flow is restored--> ie when Afib controlled.  2.  Afib: s/p DC-CV 4/18.  In NSR for a little, back in Afib last night  3.  Acute on chronic systolic CHF, AY-30-16%: Lasix recs as above  4.  Mod-severe MR: seen on TTE 3/19.    5.  Dispo:  Pending plan for Afib.  I expect Cr to fluctuate a little bit based on Afib and hypotension)    Subjective:    Back in Afib overnight.  Hypotensive down to 01U-93A systolic.  Cr a little improved today   Objective:   BP 92/66 (BP Location: Left Arm)   Pulse (!) 59   Temp 97.6 F (36.4 C) (Oral)   Resp 20   Ht 5\' 9"  (1.753 m)   Wt 84.1 kg (185 lb 8 oz) Comment: scale a  SpO2 97%   BMI 27.39 kg/m   Intake/Output Summary (Last 24 hours) at 07/17/2017 1234 Last data filed at 07/17/2017 0930 Gross per 24 hour  Intake 870 ml  Output 1600 ml  Net -730 ml   Weight change: -0.408 kg (-14.4 oz)  Physical Exam: GEN older gentleman, NAD, lying in bed HEENT EOMI PERRL NECK 3-4 cm JVD in the bed PULM normal WOB, bilateral bibasilar crackles CV RRR II/VI systolic murmur at apex ABD soft, mildly distended, NABS EXT no LE edema NEURO AAO x 3 nonfocal SKIN warm and dry    Imaging: US Renal  Result Date: 07/16/2017 CLINICAL DATA:  Fluid overload.  Renal insufficiency. EXAM: RENAL / URINARY  TRACT ULTRASOUND COMPLETE COMPARISON:  03/12/2017 CT FINDINGS: Right Kidney: Length: 11.0 cm. Normal renal echogenicity and cortical thickness for age. No hydronephrosis. Lower pole 1.6 cm right renal cyst. Left Kidney: Length: 9.8 cm. Suspect mild left renal atrophy. Normal echogenicity. No hydronephrosis. Bladder: Appears normal for degree of bladder distention. Note is made of bilateral pleural effusions. IMPRESSION: 1. No hydronephrosis or other explanation for acute renal insufficiency. The left kidney may be mildly atrophic. 2. Bilateral pleural effusions. Electronically Signed   By: Abigail Miyamoto M.D.   On: 07/16/2017 19:57    Labs: BMET Recent Labs  Lab 07/13/17 1609 07/14/17 0445 07/15/17 0424 07/16/17 0542 07/17/17 0525  NA 140 139 140 139 137  K 4.8 4.1 4.1 4.4 3.3*  CL 104 105 106 102 98*  CO2 28 23 26 26 29   GLUCOSE 180* 137* 113* 114* 85  BUN 39* 40* 40* 43* 44*  CREATININE 2.02* 2.05* 2.08* 2.11* 1.96*  CALCIUM 9.3 9.0 9.0 9.0 8.9   CBC Recent Labs  Lab 07/13/17 1609 07/14/17 0445  WBC 4.6 4.9  NEUTROABS  --  3.5  HGB 13.0 12.0*  HCT 39.5 36.7*  MCV 93.8 92.9  PLT 180 179  Medications:    . amiodarone  200 mg Oral BID  . apixaban  2.5 mg Oral BID  . cholecalciferol  2,000 Units Oral Daily  . furosemide  40 mg Intravenous BID  . latanoprost  1 drop Left Eye QHS  . metolazone  2.5 mg Oral Daily  . potassium chloride  20 mEq Oral Daily  . sodium chloride flush  3 mL Intravenous Q12H  . sodium chloride flush  3 mL Intravenous Q12H  . tamsulosin  0.4 mg Oral QPM      [name] MD [facility] pgr [phone] 07/17/2017, 12:34 PM

## 2017-07-17 NOTE — Progress Notes (Signed)
Patient converted back to Afib, EKG done. COnfirmed by CCMD but did not know when pt went back to Afib. Cardiology PA paged, made aware.

## 2017-07-17 NOTE — Consult Note (Signed)
ELECTROPHYSIOLOGY CONSULT NOTE  Patient ID: Donald [name], MRN: [id number], DOB/AGE: November 21, 1926 84 y.o. Admit date: 07/13/2017 Date of Consult: 07/17/2017  Primary Physician: Haywood Pao, MD Primary Cardiologist: ERNST CUMPSTON is a 84 y.o. male who is being seen today for the evaluation of afib at the request of Dr Harl Bowie .   Chief Complaint: Afib    HPI Donald [name] is a 84 y.o. male  Seen for atrial fibrillation reversion following DCCV 2 days ago following 3 weeks of amiodarone  He has CAD with prior CaBG, CVA and recurrent heart failure related to Afib with RVR; rate control was improved with bisoprolol HR 130>>75   He is continued to have significant symptoms despite adequate rate control.  Management has also been complicated by hypotension and this is had a significant impact on renal function.  All AV nodal blocking agents apart from amiodarone has been discontinued and his creatinine has increased.  Renal's concern is that atrial fibrillation impact on cardiac performance is underlying renal dysfunction in the setting of hypotension  Atrial fibrillation by itself was not associated with a terrible change although the creatinine was hovering in the 1.5 range in the middle of last month up from his baseline  Echo 3/19 EF 40-45 (LA 4.6/2.2/45)  Mod-severe MR and severe TR  HR graph showing rapid and slow heart rates  He reverted to atrial fibrillation this morning; this at least in bed, he does not feel any worse.   Past Medical History:  Diagnosis Date  . Asthma    with worsening with beta blockade  . CAD (coronary artery disease)   . CVA (cerebral infarction) 2012   tia, mild no residual defecits  . GI bleed 8 yrs ago   due to doll fully vessel which was clipped  . Glaucoma    excellent cataracts  . History of kidney stones   . Ischemic cardiomyopathy   . Moderate to severe mitral regurgitation 06/16/2017  . Nephrolithiasis   . Post-infarction  apical thrombus ([place])   . Prostate cancer ([place]) 2012  . Radiation proctitis Feb 2015   treated with APC  . Tubular adenoma 04/2013   Dr. Hilarie Fredrickson      Surgical History:  Past Surgical History:  Procedure Laterality Date  . cardiac stents  2003  . CARDIOVERSION N/A 06/15/2017   Procedure: CARDIOVERSION;  Surgeon: Acie Fredrickson Wonda Cheng, MD;  Location: [facility] ENDOSCOPY;  Service: Cardiovascular;  Laterality: N/A;  . CARDIOVERSION N/A 07/15/2017   Procedure: CARDIOVERSION;  Surgeon: Jerline Pain, MD;  Location: [facility] ENDOSCOPY;  Service: Cardiovascular;  Laterality: N/A;  . COLONOSCOPY WITH PROPOFOL N/A 05/05/2013   Procedure: COLONOSCOPY WITH PROPOFOL;  Surgeon: Jerene Bears, MD;  Location: [facility] ENDOSCOPY;  Service: Gastroenterology;  Laterality: N/A;  . CORONARY ARTERY BYPASS GRAFT  1998   LIMA to the LAD and saphenous vein graft tothe diagonal  . EXTRACORPOREAL SHOCK WAVE LITHOTRIPSY Right 04/29/2017   Procedure: RIGHT EXTRACORPOREAL SHOCK WAVE LITHOTRIPSY (ESWL);  Surgeon: Alexis Frock, MD;  Location: [facility] ORS;  Service: Urology;  Laterality: Right;  . history of radiation treatment  2012   x 40 treatments done  . KNEE ARTHROSCOPY  2016 or 2017   Dr Gladstone Lighter ;   . PARTIAL KNEE ARTHROPLASTY Right 01/13/2017   Procedure: UNICOMPARTMENTAL RIGHT KNEE;  Surgeon: Gaynelle Arabian, MD;  Location: [facility] ORS;  Service: Orthopedics;  Laterality: Right;  with block     Home Meds: Prior to Admission medications  Medication Sig Start Date End Date Taking? Authorizing Provider  amiodarone (PACERONE) 200 MG tablet Take 1 tablet (200 mg total) by mouth 2 (two) times daily. 06/24/17  Yes Sherran Needs, NP  apixaban (ELIQUIS) 5 MG TABS tablet Take 5 mg by mouth 2 (two) times daily.   Yes [provider]  bisoprolol (ZEBETA) 5 MG tablet Take 1 tablet (5 mg total) by mouth daily. 06/28/17 06/28/18 Yes [name], PA  Cholecalciferol (VITAMIN D) 2000 UNITS tablet Take 2,000 Units by mouth daily.   Yes [provider]  fenofibrate micronized (LOFIBRA) 200 MG capsule Take 1 capsule (200 mg total) by mouth daily. 05/25/17  Yes Lelon Perla, MD  Fluticasone Furoate-Vilanterol (BREO ELLIPTA) 100-25 MCG/INH AEPB Inhale 1 puff into the lungs daily as needed (shortness of breath).    Yes [provider]  furosemide (LASIX) 40 MG tablet Take 1.5 tablets (60 mg total) by mouth daily. 07/08/17  Yes Sherran Needs, NP  hydrocortisone cream 1 % Apply 1 application topically 3 (three) times daily as needed for itching (skin irritation). 06/16/17  Yes Eugenie Filler, MD  latanoprost (XALATAN) 0.005 % ophthalmic solution Place 1 drop into the left eye at bedtime.    Yes [provider]  loratadine (CLARITIN) 10 MG tablet Take 10 mg by mouth daily.   Yes [provider]  Multiple Vitamin (MULTIVITAMIN WITH MINERALS) TABS tablet Take 1 tablet by mouth daily.   Yes [provider]  potassium chloride 20 MEQ TBCR Take 20 mEq by mouth daily. 06/28/17  Yes [name], PA  pravastatin (PRAVACHOL) 40 MG tablet Take 1 tablet (40 mg total) by mouth at bedtime. 01/16/14  Yes Lelon Perla, MD  Tamsulosin HCl (FLOMAX) 0.4 MG CAPS Take 0.4 mg by mouth every evening.    Yes [provider]    Inpatient Medications:  . amiodarone  200 mg Oral BID  . apixaban  2.5 mg Oral BID  . cholecalciferol  2,000 Units Oral Daily  . furosemide  40 mg Intravenous BID  . latanoprost  1 drop Left Eye QHS  . metolazone  2.5 mg Oral Daily  . potassium chloride  20 mEq Oral Daily  . sodium chloride flush  3 mL Intravenous Q12H  . sodium chloride flush  3 mL Intravenous Q12H  . tamsulosin  0.4 mg Oral QPM     Allergies:  Allergies  Allergen Reactions  . Keflex [Cephalexin] Nausea And Vomiting and Other (See Comments)    'Killed all GI enzymes" (also) and patient prefers to not take this  . Morphine Nausea Only    Social History   Socioeconomic History  . Marital status:  Widowed    Spouse name: Not on file  . Number of children: 3  . Years of education: Not on file  . Highest education level: Not on file  Occupational History  . Occupation: Retired Conservation officer, [employer]  Social Needs  . Financial resource strain: Not on file  . Food insecurity:    Worry: Not on file    Inability: Not on file  . Transportation needs:    Medical: Not on file    Non-medical: Not on file  Tobacco Use  . Smoking status: Never Smoker  . Smokeless tobacco: Never Used  Substance and Sexual Activity  . Alcohol use: Yes    Alcohol/week: 0.0 oz    Comment: occasional beer or wine  . Drug use: No  . Sexual activity: Not  on file  Lifestyle  . Physical activity:    Days per week: Not on file    Minutes per session: Not on file  . Stress: Not on file  Relationships  . Social connections:    Talks on phone: Not on file    Gets together: Not on file    Attends religious service: Not on file    Active member of club or organization: Not on file    Attends meetings of clubs or organizations: Not on file    Relationship status: Not on file  . Intimate partner violence:    Fear of current or ex partner: Not on file    Emotionally abused: Not on file    Physically abused: Not on file    Forced sexual activity: Not on file  Other Topics Concern  . Not on file  Social History Narrative  . Not on file     Family History  Problem Relation Age of Onset  . Heart attack Father   . Heart disease Mother   . Stomach cancer Mother      ROS:  Please see the history of present illness.     All other systems reviewed and negative.    Physical Exam:  Blood pressure (!) 88/71, pulse 93, temperature 97.8 F (36.6 C), temperature source Oral, resp. rate 14, height 5\' 9"  (1.753 m), weight 185 lb 8 oz (84.1 kg), SpO2 98 %. General: Well developed, well nourished male in no acute distress. Head: Normocephalic, atraumatic, sclera non-icteric, no xanthomas, nares are without  discharge. EENT: normal Lymph Nodes:  none Back: without scoliosis/kyphosis *, no CVA tendersness Neck: Negative for carotid bruits. JVD 8. Lungs: Clear bilaterally to auscultation without wheezes, rales, or rhonchi. Breathing is unlabored. Heart irregularly irregular rate and rhythm with 2/6 systolic urmur , rubs, or gallops appreciated. Abdomen: Soft, non-tender, non-distended with normoactive bowel sounds. No hepatomegaly.  Abdominal distention Msk:  Strength and tone appear normal for age. Extremities: No clubbing or cyanosis. No edema.  Distal pedal pulses are 2+ and equal bilaterally. Skin: Warm and Dry Neuro: Alert and oriented X 3. CN III-XII intact Grossly normal sensory and motor function . Psych:  Responds to questions appropriately with a normal affect.      Labs: Cardiac Enzymes Recent Labs    07/17/17 0525  CKTOTAL 91   CBC Lab Results  Component Value Date   WBC 4.9 07/14/2017   HGB 12.0 (L) 07/14/2017   HCT 36.7 (L) 07/14/2017   MCV 92.9 07/14/2017   PLT 179 07/14/2017   PROTIME: No results for input(s): LABPROT, INR in the last 72 hours. Chemistry  Recent Labs  Lab 07/17/17 0525  NA 137  K 3.3*  CL 98*  CO2 29  BUN 44*  CREATININE 1.96*  CALCIUM 8.9  GLUCOSE 85   Lipids Lab Results  Component Value Date   CHOL 60 07/15/2017   HDL 19 (L) 07/15/2017   LDLCALC 31 07/15/2017   TRIG 51 07/15/2017   BNP No results found for: PROBNP Thyroid Function Tests: No results for input(s): TSH, T4TOTAL, T3FREE, THYROIDAB in the last 72 hours.  Invalid input(s): FREET3    Miscellaneous No results found for: DDIMER  Radiology/Studies:  US Renal  Result Date: 2017/07/28 CLINICAL DATA:  Fluid overload.  Renal insufficiency. EXAM: RENAL / URINARY TRACT ULTRASOUND COMPLETE COMPARISON:  03/12/2017 CT FINDINGS: Right Kidney: Length: 11.0 cm. Normal renal echogenicity and cortical thickness for age. No hydronephrosis. Lower pole 1.6 cm  right renal cyst.  Left Kidney: Length: 9.8 cm. Suspect mild left renal atrophy. Normal echogenicity. No hydronephrosis. Bladder: Appears normal for degree of bladder distention. Note is made of bilateral pleural effusions. IMPRESSION: 1. No hydronephrosis or other explanation for acute renal insufficiency. The left kidney may be mildly atrophic. 2. Bilateral pleural effusions. Electronically Signed   By: Abigail Miyamoto M.D.   On: 07/16/2017 19:57   Dg Chest Port 1 View  Result Date: 07/14/2017 CLINICAL DATA:  Chronic shortness of breath EXAM: PORTABLE CHEST 1 VIEW COMPARISON:  06/10/2017 FINDINGS: Cardiac shadow is at the upper limits of normal in size. Post surgical changes are noted and stable. No sizable infiltrate or effusion is noted. No bony abnormality is seen. IMPRESSION: No acute abnormality noted. Electronically Signed   By: Inez Catalina M.D.   On: 07/14/2017 12:24    EKG: Atrial fibrillation with bifascicular block Telemetry personally reviewed demonstrating reversion to atrial fibrillation rates now in the 70s    Assessment and Plan:  Atrial fibrillation   RBBB LPDB  Hypotension  Cardiomyopathy valvular  MR TR and severe BAE  Heart failure acute chroic systolic diastolic    The patient's is in a challenging predicament.  Atrial fibrillation is quite symptomatic despite adequate rate control and blood pressures are low despite the absence of AV nodal blocking agents.  Not withstanding he still has evidence of right-sided heart failure and worsening renal perfusion.  He has failed cardioversion following 3 weeks of amiodarone.  He is not really candidate for alternative antiarrhythmic therapy or perhaps localizing adjunctive to amiodarone might improve the situation.  Furthermore, with the adage that atrial fibrillation begets atrial fibrillation, it is also thought that sinus rhythm begets sinus rhythm.  Hence, I would add were no wheezing.  I would continue his amiodarone.  I would consider  cardioversion either Monday or Tuesday and given the limitations of options, I would consider relatively frequent cardioversions to see if again sinus rhythm could be get more sinus rhythm.  I reviewed this with his granddaughter.  Virl Axe

## 2017-07-17 NOTE — Progress Notes (Signed)
Dr Doylene Canard notified of patient's manual BP 82/40. Patient is asymptomatic. No other complaints at this time. MD order to watch patient and  report any further changes. Patient in bed resting. Will continue to monitor

## 2017-07-18 LAB — BASIC METABOLIC PANEL
ANION GAP: 11 (ref 5–15)
BUN: 37 mg/dL — ABNORMAL HIGH (ref 6–20)
CALCIUM: 9.1 mg/dL (ref 8.9–10.3)
CHLORIDE: 95 mmol/L — AB (ref 101–111)
CO2: 31 mmol/L (ref 22–32)
Creatinine, Ser: 1.84 mg/dL — ABNORMAL HIGH (ref 0.61–1.24)
GFR calc non Af Amer: 31 mL/min — ABNORMAL LOW (ref >60)
GFR, EST AFRICAN AMERICAN: 36 mL/min — AB (ref >60)
Glucose, Bld: 95 mg/dL (ref 65–99)
Potassium: 3.4 mmol/L — ABNORMAL LOW (ref 3.5–5.1)
SODIUM: 137 mmol/L (ref 135–145)

## 2017-07-18 MED ORDER — POTASSIUM CHLORIDE CRYS ER 20 MEQ PO TBCR
40.0000 meq | EXTENDED_RELEASE_TABLET | Freq: Once | ORAL | Status: AC
  Administered 2017-07-18: 40 meq via ORAL
  Filled 2017-07-18: qty 2

## 2017-07-18 MED ORDER — SODIUM CHLORIDE 0.9% FLUSH: 3.0000 mL | INTRAVENOUS | Status: DC | PRN

## 2017-07-18 MED ORDER — SODIUM CHLORIDE 0.9% FLUSH
3.0000 mL | Freq: Two times a day (BID) | INTRAVENOUS | Status: DC
  Administered 2017-07-20 – 2017-07-21 (×3): 3 mL via INTRAVENOUS

## 2017-07-18 MED ORDER — SODIUM CHLORIDE 0.9 % IV SOLN
250.0000 mL | INTRAVENOUS | Status: DC
  Administered 2017-07-19: 250 mL via INTRAVENOUS
  Administered 2017-07-20: 500 mL via INTRAVENOUS

## 2017-07-18 NOTE — Progress Notes (Signed)
[facility] Progress Note    Assessment/ Plan:   1.  AKI on CKD III: It appears pt has had a gradual rise in Cr since 05/2017 when his Afib problems really started.  Recurrent Afib could cause decreased effective arterial blood flow and repeated AKI.  Pt would have a little more difficulty with diuresis in the setting of said AKI.  He does seem a little vol overload so if a more robust diuresis is desired would increase Lasix to 80 IV BID but it seems he's doing just fine on present Lasix dosing.  Having the history of obstructing stone--> renal US repeated which is without obstruction but does note some echogenicity esp on the L.  CK 91.  I would expect Cr to improve to baseline once forward flow is restored--> ie when Afib controlled.  He may have an element of cardiorenal syndrome based on MR-- but this wouldn't change recs at this point really, achieving euvolemia important.  2.  Afib: s/p DC-CV 4/18.  In NSR for a little, back in Afib, for DC-CV cardioversion #3 tomorrow 4/22.  On amiodarone and Ranexa.  3.  Acute on chronic systolic CHF, EX-51-70%: Lasix recs as above  4.  Mod-severe MR: seen on TTE 3/19.  5.  Dispo:  Pending    Subjective:    Good diuresis yesterday and improving Cr.     Objective:   BP 91/67 (BP Location: Left Arm)   Pulse (!) 105   Temp (!) 97.4 F (36.3 C) (Oral)   Resp 20   Ht 5\' 9"  (1.753 m)   Wt 82.6 kg (182 lb 3.2 oz) Comment: scale a  SpO2 97%   BMI 26.91 kg/m   Intake/Output Summary (Last 24 hours) at 07/18/2017 1157 Last data filed at 07/18/2017 0174 Gross per 24 hour  Intake 720 ml  Output 1000 ml  Net -280 ml   Weight change: -1.497 kg (-3 lb 4.8 oz)  Physical Exam: GEN older gentleman, NAD, lying in bed HEENT EOMI PERRL NECK 3-4 cm JVD in the bed PULM normal WOB, crackles much improved and nearly resolved CV RRR II/VI systolic murmur at apex ABD soft, mildly distended, NABS EXT no LE edema NEURO AAO x 3  nonfocal SKIN warm and dry    Imaging: US Renal  Result Date: 07/16/2017 CLINICAL DATA:  Fluid overload.  Renal insufficiency. EXAM: RENAL / URINARY TRACT ULTRASOUND COMPLETE COMPARISON:  03/12/2017 CT FINDINGS: Right Kidney: Length: 11.0 cm. Normal renal echogenicity and cortical thickness for age. No hydronephrosis. Lower pole 1.6 cm right renal cyst. Left Kidney: Length: 9.8 cm. Suspect mild left renal atrophy. Normal echogenicity. No hydronephrosis. Bladder: Appears normal for degree of bladder distention. Note is made of bilateral pleural effusions. IMPRESSION: 1. No hydronephrosis or other explanation for acute renal insufficiency. The left kidney may be mildly atrophic. 2. Bilateral pleural effusions. Electronically Signed   By: Abigail Miyamoto M.D.   On: 07/16/2017 19:57    Labs: BMET Recent Labs  Lab 07/13/17 1609 07/14/17 0445 07/15/17 0424 07/16/17 0542 07/17/17 0525 07/18/17 0827  NA 140 139 140 139 137 137  K 4.8 4.1 4.1 4.4 3.3* 3.4*  CL 104 105 106 102 98* 95*  CO2 28 23 26 26 29 31   GLUCOSE 180* 137* 113* 114* 85 95  BUN 39* 40* 40* 43* 44* 37*  CREATININE 2.02* 2.05* 2.08* 2.11* 1.96* 1.84*  CALCIUM 9.3 9.0 9.0 9.0 8.9 9.1   CBC Recent Labs  Lab 07/13/17  1609 07/14/17 0445  WBC 4.6 4.9  NEUTROABS  --  3.5  HGB 13.0 12.0*  HCT 39.5 36.7*  MCV 93.8 92.9  PLT 180 179    Medications:    . amiodarone  400 mg Oral BID  . apixaban  2.5 mg Oral BID  . cholecalciferol  2,000 Units Oral Daily  . furosemide  40 mg Intravenous BID  . latanoprost  1 drop Left Eye QHS  . potassium chloride  20 mEq Oral Daily  . ranolazine  500 mg Oral BID  . sodium chloride flush  3 mL Intravenous Q12H  . sodium chloride flush  3 mL Intravenous Q12H  . tamsulosin  0.4 mg Oral QPM      [name] MD [facility] [phone] 07/18/2017, 11:57 AM

## 2017-07-18 NOTE — Progress Notes (Addendum)
Progress Note  Patient Name: Donald [name] Date of Encounter: 07/18/2017  Primary Cardiologist: [name]  Subjective   SOB improving.   Inpatient Medications    Scheduled Meds: . amiodarone  400 mg Oral BID  . apixaban  2.5 mg Oral BID  . cholecalciferol  2,000 Units Oral Daily  . furosemide  40 mg Intravenous BID  . latanoprost  1 drop Left Eye QHS  . metolazone  2.5 mg Oral Daily  . potassium chloride  20 mEq Oral Daily  . ranolazine  500 mg Oral BID  . sodium chloride flush  3 mL Intravenous Q12H  . sodium chloride flush  3 mL Intravenous Q12H  . tamsulosin  0.4 mg Oral QPM   Continuous Infusions: . sodium chloride    . sodium chloride 500 mL (07/15/17 0856)   PRN Meds: sodium chloride, acetaminophen, albuterol, fluticasone furoate-vilanterol, hydrocortisone cream, sodium chloride flush, sodium chloride flush   Vital Signs    Vitals:   07/17/17 0935 07/17/17 1211 07/17/17 1944 07/18/17 0544  BP: (!) 88/71 92/66 108/84 (!) 90/58  Pulse: 93 (!) 59 76 73  Resp:  20 18 18   Temp:  97.6 F (36.4 C) (!) 97.5 F (36.4 C) 98 F (36.7 C)  TempSrc:  Oral Oral Oral  SpO2:  97% 98% 98%  Weight:    182 lb 3.2 oz (82.6 kg)  Height:        Intake/Output Summary (Last 24 hours) at 07/18/2017 0845 Last data filed at 07/18/2017 0500 Gross per 24 hour  Intake 720 ml  Output 1000 ml  Net -280 ml   Filed Weights   07/16/17 0545 07/17/17 0505 07/18/17 0544  Weight: 187 lb 9.6 oz (85.1 kg) 185 lb 8 oz (84.1 kg) 182 lb 3.2 oz (82.6 kg)    Telemetry    Rate controlled afib - Personally Reviewed  ECG    na  Physical Exam   GEN: No acute distress.   Neck: No JVD Cardiac: GYFVC,9/4 systolic murmur at apex  Respiratory: Clear to auscultation bilaterally. GI: Soft, nontender, non-distended  MS: No edema; No deformity. Neuro:  Nonfocal  Psych: Normal affect   Labs    Chemistry Recent Labs  Lab 07/15/17 0424 07/16/17 0542 07/17/17 0525  NA 140 139 137  K  4.1 4.4 3.3*  CL 106 102 98*  CO2 26 26 29   GLUCOSE 113* 114* 85  BUN 40* 43* 44*  CREATININE 2.08* 2.11* 1.96*  CALCIUM 9.0 9.0 8.9  GFRNONAA 27* 26* 29*  GFRAA 31* 30* 33*  ANIONGAP 8 11 10      Hematology Recent Labs  Lab 07/13/17 1609 07/14/17 0445  WBC 4.6 4.9  RBC 4.21* 3.95*  HGB 13.0 12.0*  HCT 39.5 36.7*  MCV 93.8 92.9  MCH 30.9 30.4  MCHC 32.9 32.7  RDW 15.3 15.0  PLT 180 179    Cardiac EnzymesNo results for input(s): TROPONINI in the last 168 hours. No results for input(s): TROPIPOC in the last 168 hours.   BNP Recent Labs  Lab 07/14/17 0445  BNP 1,463.4*     DDimer No results for input(s): DDIMER in the last 168 hours.   Radiology    US Renal  Result Date: 07/16/2017 CLINICAL DATA:  Fluid overload.  Renal insufficiency. EXAM: RENAL / URINARY TRACT ULTRASOUND COMPLETE COMPARISON:  03/12/2017 CT FINDINGS: Right Kidney: Length: 11.0 cm. Normal renal echogenicity and cortical thickness for age. No hydronephrosis. Lower pole 1.6 cm right renal cyst. Left Kidney:  Length: 9.8 cm. Suspect mild left renal atrophy. Normal echogenicity. No hydronephrosis. Bladder: Appears normal for degree of bladder distention. Note is made of bilateral pleural effusions. IMPRESSION: 1. No hydronephrosis or other explanation for acute renal insufficiency. The left kidney may be mildly atrophic. 2. Bilateral pleural effusions. Electronically Signed   By: Abigail Miyamoto M.D.   On: 07/16/2017 19:57    Cardiac Studies     Patient Profile     84 y.o.malePMH of CAD s/p CABG1998, h/o ICM with improved EF, prior CVA, prior mural thrombus on chronic coumadin Rx, prostate CA, and h/o GI bleed.He had a cardiac catheterization in June 2013 that showed 100%ostLAD, distal LAD filled with RV Dre Gamino off of RCA, PLA possibly occluded by most distal RCA appears to be filled from LIMA collateral, LIMA patent but does not supply distal LAD. Medical therapy was recommended. Last echocardiogram  obtained on 11/13/2013 showed EF 50-55%, akinesis of the entire apical myocardium, grade 1 DD, PA peak pressure 56 mmHg. Ejection fraction has improved from the previous 40-45% in 2007. He is intolerant to all beta blockers.Myoview obtained on 11/02/2016 showed EF 46%, defect in the distal anterior, distal inferior, apical wall consistent with small regional scar, inferior thinning consistent with soft tissue attenuation. Otherwise no ischemia.  Pt presented from the AF clinic with weight gain and worsening SOB which had been ongoing since early 05/2017.    Assessment & Plan    1. Afib -DCCV in 03/2019initially successful but converted back to AF within a few days -Leftand rightatrium severely enlargedper echocardiogram making if potentially difficultto restore and maintain NSR -Onamiodarone 200 mgPO BID. Had started oral load in afib clinic.  -Initially scheduled for OP DCCVwith Dr. [name] on 4/25>>however this was scheduled as inpatient for 07/15/17>>successful DCCV with 120, 150J with conversion to NSR - he is on eliquis 2.5mg  bid, CHADS2Vasc score is 4 - yesterday went back into afib. - bisoprolol stopped, low bp's, held for last several days.   - patient reportedly symptomatic even in rate controlled afib per charting and family report.  - seen by EP yesterday with recs to repeat DCCV this admission. Amiodarone was increased to 400mg  bid, and ranexa started to augment the affects of amiodarone.   2. 2. Acute on chronic combined systolic/diastolic HF - 09/9148 echo LVEF 40-45%, mild AS, mod to severe MR - Reports that he has been SOB since his dx of AF in March 2019. He had a recent hospitalization in early April in which he states he has not been back to his baseline.  - baseline weight reported around 180 lbs - no ACE/ARB/ARNI due to poor renal function - on lasix 40mg  IV bid, had received some oral doses of metolazone earlier this admission. AM lasix held yesterday due to  soft bp's, got pm dose. Negative 280 yesterday, negative 2.3 liters since admission.     3. CAD - history of prior CABG   4. CKD II - follow labs - renal following inpatient.   5. Mitral regurgitation -mod to severe by echo - I will review films, this may be playing a large part in his fluid issues.    Echo reviewed, agree moderate to severe MR. RV enlarged with ventricular diastolic flattending consistent with RV volume overload, probably severe TR. RV severely enlarged, RV TAPSE of 1.2 cm would suggest moderate dysfunction.    For questions or updates, please contact [place] Please consult [email or web address] for contact info under Cardiology/STEMI.  Merrily Pew, MD  07/18/2017, 8:45 AM

## 2017-07-18 NOTE — Plan of Care (Signed)
  Problem: Education: Goal: Knowledge of General Education information will improve Outcome: Completed/Met   Problem: Clinical Measurements: Goal: Ability to maintain clinical measurements within normal limits will improve Outcome: Completed/Met   Problem: Coping: Goal: Level of anxiety will decrease Outcome: Completed/Met   Problem: Elimination: Goal: Will not experience complications related to urinary retention Outcome: Completed/Met

## 2017-07-18 NOTE — Progress Notes (Signed)
Patient BP was low at 10:30 am dose.   Spoke to Dr. Harl Bowie- will hold lasix but ok to give amiodarone

## 2017-07-19 DIAGNOSIS — I481 Persistent atrial fibrillation: Principal | ICD-10-CM

## 2017-07-19 LAB — BASIC METABOLIC PANEL
ANION GAP: 7 (ref 5–15)
BUN: 35 mg/dL — ABNORMAL HIGH (ref 6–20)
CALCIUM: 8.9 mg/dL (ref 8.9–10.3)
CHLORIDE: 99 mmol/L — AB (ref 101–111)
CO2: 29 mmol/L (ref 22–32)
Creatinine, Ser: 1.77 mg/dL — ABNORMAL HIGH (ref 0.61–1.24)
GFR calc Af Amer: 37 mL/min — ABNORMAL LOW (ref >60)
GFR calc non Af Amer: 32 mL/min — ABNORMAL LOW (ref >60)
GLUCOSE: 101 mg/dL — AB (ref 65–99)
Potassium: 3.9 mmol/L (ref 3.5–5.1)
Sodium: 135 mmol/L (ref 135–145)

## 2017-07-19 NOTE — Progress Notes (Signed)
[facility] Progress Note    Assessment/ Plan:   1.  AKI on CKD III: Improving, Cr 1.77.  It appears pt has had a gradual rise in Cr since 05/2017 when his Afib problems really started.  Recurrent Afib could cause decreased effective arterial blood flow and repeated AKI.  Pt would have a little more difficulty with diuresis in the setting of said AKI.  He does seem a little vol overload so if a more robust diuresis is desired would increase Lasix to 80 IV BID but it seems he's doing just fine on present Lasix dosing.  Having the history of obstructing stone--> renal US repeated which is without obstruction but does note some echogenicity esp on the L.  CK 91.  I would expect Cr to improve to baseline once forward flow is restored--> ie when Afib controlled.  He may have an element of cardiorenal syndrome based on MR-- but this wouldn't change recs at this point really, achieving euvolemia important.  2.  Afib: s/p DC-CV 4/18.  In NSR for a little, back in Afib, for DC-CV cardioversion #3 4/23.  On amiodarone and Ranexa.  3.  Acute on chronic systolic CHF, IO-27-03%: Lasix recs as above  4.  Mod-severe MR: seen on TTE 3/19.  5.  Dispo:  Pending    Subjective:   Patient doing well today. Hopeful to get DCCV tomorrow. Denies SOB or CP.   Objective:   BP 102/76   Pulse 85   Temp (!) 97.5 F (36.4 C) (Oral)   Resp 20   Ht 5\' 9"  (1.753 m)   Wt 180 lb 4.8 oz (81.8 kg)   SpO2 97%   BMI 26.63 kg/m   Intake/Output Summary (Last 24 hours) at 07/19/2017 1339 Last data filed at 07/19/2017 0831 Gross per 24 hour  Intake 720 ml  Output 2700 ml  Net -1980 ml   Weight change: -1 lb 14.4 oz (-0.862 kg)  Physical Exam: GEN older gentleman, NAD, lying in bed HEENT EOMI PERRL NECK 3-4 cm JVD in the bed PULM normal WOB, crackles much improved and nearly resolved CV RRR II/VI systolic murmur at apex ABD soft, mildly distended, NABS EXT no LE edema NEURO AAO x 3  nonfocal SKIN warm and dry    Imaging: No results found.  Labs: BMET Recent Labs  Lab 07/13/17 1609 07/14/17 0445 07/15/17 0424 07/16/17 0542 07/17/17 0525 07/18/17 0827 07/19/17 0713  NA 140 139 140 139 137 137 135  K 4.8 4.1 4.1 4.4 3.3* 3.4* 3.9  CL 104 105 106 102 98* 95* 99*  CO2 28 23 26 26 29 31 29   GLUCOSE 180* 137* 113* 114* 85 95 101*  BUN 39* 40* 40* 43* 44* 37* 35*  CREATININE 2.02* 2.05* 2.08* 2.11* 1.96* 1.84* 1.77*  CALCIUM 9.3 9.0 9.0 9.0 8.9 9.1 8.9   CBC Recent Labs  Lab 07/13/17 1609 07/14/17 0445  WBC 4.6 4.9  NEUTROABS  --  3.5  HGB 13.0 12.0*  HCT 39.5 36.7*  MCV 93.8 92.9  PLT 180 179    Medications:    . amiodarone  400 mg Oral BID  . apixaban  2.5 mg Oral BID  . cholecalciferol  2,000 Units Oral Daily  . furosemide  40 mg Intravenous BID  . latanoprost  1 drop Left Eye QHS  . potassium chloride  20 mEq Oral Daily  . ranolazine  500 mg Oral BID  . sodium chloride flush  3 mL Intravenous  Q12H  . sodium chloride flush  3 mL Intravenous Q12H  . sodium chloride flush  3 mL Intravenous Q12H  . tamsulosin  0.4 mg Oral QPM      Harriet Butte, DO, PGY-2 07/19/2017, 1:39 PM

## 2017-07-19 NOTE — H&P (View-Only) (Signed)
Progress Note  Patient Name: Donald [name] Date of Encounter: 07/19/2017  Primary Cardiologist: Dr. [name]  Subjective   Pt doing well this AM. Unfortunately due to scheduling complications, DCCV will need to be tomorrow. Denies chest pain or SOB this AM. Has ambulated in the room.   Inpatient Medications    Scheduled Meds: . amiodarone  400 mg Oral BID  . apixaban  2.5 mg Oral BID  . cholecalciferol  2,000 Units Oral Daily  . furosemide  40 mg Intravenous BID  . latanoprost  1 drop Left Eye QHS  . potassium chloride  20 mEq Oral Daily  . ranolazine  500 mg Oral BID  . sodium chloride flush  3 mL Intravenous Q12H  . sodium chloride flush  3 mL Intravenous Q12H  . sodium chloride flush  3 mL Intravenous Q12H  . tamsulosin  0.4 mg Oral QPM   Continuous Infusions: . sodium chloride    . sodium chloride 500 mL (07/15/17 0856)  . sodium chloride 250 mL (07/19/17 0617)   PRN Meds: sodium chloride, acetaminophen, albuterol, fluticasone furoate-vilanterol, hydrocortisone cream, sodium chloride flush, sodium chloride flush, sodium chloride flush   Vital Signs    Vitals:   07/19/17 0508 07/19/17 0509 07/19/17 0822 07/19/17 0834  BP: (!) 89/70  (!) 73/54 102/76  Pulse: 88  88 85  Resp:      Temp:      TempSrc:      SpO2: 96%  94% 97%  Weight:  180 lb 4.8 oz (81.8 kg)    Height:        Intake/Output Summary (Last 24 hours) at 07/19/2017 1202 Last data filed at 07/19/2017 0831 Gross per 24 hour  Intake 720 ml  Output 2700 ml  Net -1980 ml   Filed Weights   07/17/17 0505 07/18/17 0544 07/19/17 0509  Weight: 185 lb 8 oz (84.1 kg) 182 lb 3.2 oz (82.6 kg) 180 lb 4.8 oz (81.8 kg)    Physical Exam   General: Well developed, well nourished, NAD Skin: Warm, dry, intact  Head: Normocephalic, atraumatic, clear, moist mucus membranes. Neck: Negative for carotid bruits. No JVD Lungs:Clear to ausculation bilaterally. No wheezes, rales, or rhonchi. Breathing is  unlabored. Cardiovascular: Irregularly irregular with S1 S2. No murmurs, rubs, or gallops Abdomen: Soft, non-tender, non-distended with normoactive bowel sounds. No obvious abdominal masses. MSK: Strength and tone appear normal for age. 5/5 in all extremities Extremities: No edema. No clubbing or cyanosis. DP/PT pulses 2+ bilaterally Neuro: Alert and oriented. No focal deficits. No facial asymmetry. MAE spontaneously. Psych: Responds to questions appropriately with normal affect.    Labs    Chemistry Recent Labs  Lab 07/17/17 0525 07/18/17 0827 07/19/17 0713  NA 137 137 135  K 3.3* 3.4* 3.9  CL 98* 95* 99*  CO2 29 31 29   GLUCOSE 85 95 101*  BUN 44* 37* 35*  CREATININE 1.96* 1.84* 1.77*  CALCIUM 8.9 9.1 8.9  GFRNONAA 29* 31* 32*  GFRAA 33* 36* 37*  ANIONGAP 10 11 7      Hematology Recent Labs  Lab 07/13/17 1609 07/14/17 0445  WBC 4.6 4.9  RBC 4.21* 3.95*  HGB 13.0 12.0*  HCT 39.5 36.7*  MCV 93.8 92.9  MCH 30.9 30.4  MCHC 32.9 32.7  RDW 15.3 15.0  PLT 180 179    Cardiac EnzymesNo results for input(s): TROPONINI in the last 168 hours. No results for input(s): TROPIPOC in the last 168 hours.   BNP  Recent Labs  Lab 07/14/17 0445  BNP 1,463.4*     DDimer No results for input(s): DDIMER in the last 168 hours.   Radiology    No results found.  Telemetry    Atrial fibrillation - Personally Reviewed  ECG    No new tracings as of 07/19/17 - Personally Reviewed  Cardiac Studies   Echocardiogram 06/13/17: Study Conclusions  - Left ventricle: The cavity size was normal. Wall thickness was increased in a pattern of mild LVH. Systolic function was mildly to moderately reduced. The estimated ejection fraction was in the range of 40% to 45%. Akinesis of the apical myocardium. - Ventricular septum: The contour showed systolic flattening. These changes are consistent with RV volume and pressure overload. - Aortic valve: Trileaflet; normal thickness,  mildly calcified leaflets. Transvalvular velocity was increased. There was mild stenosis. Valve area (VTI): 1.28 cm^2. Valve area (Vmax): 1.35 cm^2. Valve area (Vmean): 1.21 cm^2. - Aortic root: The aortic root was mildly dilated. - Mitral valve: Mildly calcified annulus. There was moderate to severe regurgitation. - Left atrium: The atrium was severely dilated. - Right ventricle: The cavity size was dilated. Wall thickness was normal. - Right atrium: The atrium was severely dilated. - Tricuspid valve: There was moderate regurgitation. - Pulmonary arteries: Systolic pressure could not be accurately estimated. Suspect it is higher because of other characteristics. PA peak pressure: 33 mm Hg (S).  12/03/16 Stress test:  Defect in distal anterior, distal inferior and apical walls consistent with small region of scar. Inferior thinning consistent with soft tissue attenuation and/or scar No ischemia.  Nuclear stress EF: 46%.  This is an intermediate risk study.  Patient Profile     84 y.o. male PMH of CAD s/p CABG1998, h/o ICM with improved EF, prior CVA, prior mural thrombus on chronic coumadin Rx, prostate CA, and h/o GI bleed.He had a cardiac catheterization in June 2013 that showed 100%ostLAD, distal LAD filled with RV branch off of RCA, PLA possibly occluded by most distal RCA appears to be filled from LIMA collateral, LIMA patent but does not supply distal LAD. Medical therapy was recommended. Last echocardiogram obtained on 11/13/2013 showed EF 50-55%, akinesis of the entire apical myocardium, grade 1 DD, PA peak pressure 56 mmHg. Ejection fraction has improved from the previous 40-45% in 2007. He is intolerant to all beta blockers.Myoview obtained on 11/02/2016 showed EF 46%, defect in the distal anterior, distal inferior, apical wall consistent with small regional scar, inferior thinning consistent with soft tissue attenuation. Otherwise no ischemia.  Pt presented  from the AF clinic with weight gain and worsening SOB which had been ongoing since early 05/2017.  Assessment & Plan    1. Persistent Atrial fibrillation: -DCCV in 03/2019initially successful but converted back to AF within a few days -Leftand rightatrium severely enlargedper echocardiogram making if potentially difficultto restore and maintain NSR -Onamiodarone 200 mgPO BID>>>which has now been uptitrated to 400mg  BID secondary to second unsuccessful DCCV on 07/15/17 -On eliquis 2.5mg  bid, CHADS2Vasc score is 4 -Back into afib on 07/17/17 -Bisoprolol stopped secondary to low bp's (held for last several days) -Plan is for repeat DCCV per EP recommendation (07/18/17) however schedule would not allow for today>>>will schedule for tomorrow, 07/20/17 -Amiodarone was increased to 400mg  bid, and ranexa started to augment the affects of amiodarone   2. Acute on chronic combined systolic/diastolic HF: -Per echo with LVEF 40-45%, mild AS, mod to severe MR -Reports that he has been SOB since his dx of AF in  March 2019. He had a recent hospitalization in early April in which he states he has not been back to his baseline. -Weight, 180lb today, 182lb on 07/18/17 -I&O, net negative 3.6 since admission, total 24H output 2.3L -Does not appear fluid volume overloaded on exam  -No ACE/ARB/ARNI due to poor renal function -On lasix 40mg  IV bid, had received some oral doses of metolazone earlier this admission. AM lasix held yesterday due to soft bp's, got pm dose>>> again BP soft and AM Lasix held   3. CAD: -History of prior CABG -Denies recent chest pain and other anginal symptoms -BB, statin  4. CKD II: -Nephrology consulted -Creatinine improving, 1.77 today. Downtrending  -Last hospitalization 1.36>1.32>1.63 (07/05/17)>2.02 on admission    5. Mitral regurgitation: -Mod to severe per echo 06/13/17 -May be playing a large part in his fluid issues>>MD to review  -Echo reviewed, agree  moderate to severe MR. RV enlarged with ventricular diastolic flattending consistent with RV volume overload, probably severe TR. RV severely enlarged, RV TAPSE of 1.2 cm would suggest moderate dysfunction.    SignedKathyrn Drown NP-C [facility] Pager: [phone] 07/19/2017, 12:02 PM     Patient seen and examined. Agree with assessment and plan. No chest pain or dyspnea.  Not able to have cardioversion today due to schedule; re-scheduled for tomorrow.  Now on amiodarone 400 mg bid; AF rate controlled.  Cr 1.77 today. On reduced dose eliqus with age and Cr > 1.5. Will f/u BNP, Mg. Bmet in am.   Troy Sine, MD, [facility] 07/19/2017 12:54 PM  For questions or updates, please contact   Please consult [email or web address] for contact info under Cardiology/STEMI.

## 2017-07-19 NOTE — Progress Notes (Addendum)
Progress Note  Patient Name: Donald [name] Date of Encounter: 07/19/2017  Primary Cardiologist: Dr. [name]  Subjective   Pt doing well this AM. Unfortunately due to scheduling complications, DCCV will need to be tomorrow. Denies chest pain or SOB this AM. Has ambulated in the room.   Inpatient Medications    Scheduled Meds: . amiodarone  400 mg Oral BID  . apixaban  2.5 mg Oral BID  . cholecalciferol  2,000 Units Oral Daily  . furosemide  40 mg Intravenous BID  . latanoprost  1 drop Left Eye QHS  . potassium chloride  20 mEq Oral Daily  . ranolazine  500 mg Oral BID  . sodium chloride flush  3 mL Intravenous Q12H  . sodium chloride flush  3 mL Intravenous Q12H  . sodium chloride flush  3 mL Intravenous Q12H  . tamsulosin  0.4 mg Oral QPM   Continuous Infusions: . sodium chloride    . sodium chloride 500 mL (07/15/17 0856)  . sodium chloride 250 mL (07/19/17 0617)   PRN Meds: sodium chloride, acetaminophen, albuterol, fluticasone furoate-vilanterol, hydrocortisone cream, sodium chloride flush, sodium chloride flush, sodium chloride flush   Vital Signs    Vitals:   07/19/17 0508 07/19/17 0509 07/19/17 0822 07/19/17 0834  BP: (!) 89/70  (!) 73/54 102/76  Pulse: 88  88 85  Resp:      Temp:      TempSrc:      SpO2: 96%  94% 97%  Weight:  180 lb 4.8 oz (81.8 kg)    Height:        Intake/Output Summary (Last 24 hours) at 07/19/2017 1202 Last data filed at 07/19/2017 0831 Gross per 24 hour  Intake 720 ml  Output 2700 ml  Net -1980 ml   Filed Weights   07/17/17 0505 07/18/17 0544 07/19/17 0509  Weight: 185 lb 8 oz (84.1 kg) 182 lb 3.2 oz (82.6 kg) 180 lb 4.8 oz (81.8 kg)    Physical Exam   General: Well developed, well nourished, NAD Skin: Warm, dry, intact  Head: Normocephalic, atraumatic, clear, moist mucus membranes. Neck: Negative for carotid bruits. No JVD Lungs:Clear to ausculation bilaterally. No wheezes, rales, or rhonchi. Breathing is  unlabored. Cardiovascular: Irregularly irregular with S1 S2. No murmurs, rubs, or gallops Abdomen: Soft, non-tender, non-distended with normoactive bowel sounds. No obvious abdominal masses. MSK: Strength and tone appear normal for age. 5/5 in all extremities Extremities: No edema. No clubbing or cyanosis. DP/PT pulses 2+ bilaterally Neuro: Alert and oriented. No focal deficits. No facial asymmetry. MAE spontaneously. Psych: Responds to questions appropriately with normal affect.    Labs    Chemistry Recent Labs  Lab 07/17/17 0525 07/18/17 0827 07/19/17 0713  NA 137 137 135  K 3.3* 3.4* 3.9  CL 98* 95* 99*  CO2 29 31 29   GLUCOSE 85 95 101*  BUN 44* 37* 35*  CREATININE 1.96* 1.84* 1.77*  CALCIUM 8.9 9.1 8.9  GFRNONAA 29* 31* 32*  GFRAA 33* 36* 37*  ANIONGAP 10 11 7      Hematology Recent Labs  Lab 07/13/17 1609 07/14/17 0445  WBC 4.6 4.9  RBC 4.21* 3.95*  HGB 13.0 12.0*  HCT 39.5 36.7*  MCV 93.8 92.9  MCH 30.9 30.4  MCHC 32.9 32.7  RDW 15.3 15.0  PLT 180 179    Cardiac EnzymesNo results for input(s): TROPONINI in the last 168 hours. No results for input(s): TROPIPOC in the last 168 hours.   BNP  Recent Labs  Lab 07/14/17 0445  BNP 1,463.4*     DDimer No results for input(s): DDIMER in the last 168 hours.   Radiology    No results found.  Telemetry    Atrial fibrillation - Personally Reviewed  ECG    No new tracings as of 07/19/17 - Personally Reviewed  Cardiac Studies   Echocardiogram 06/13/17: Study Conclusions  - Left ventricle: The cavity size was normal. Wall thickness was increased in a pattern of mild LVH. Systolic function was mildly to moderately reduced. The estimated ejection fraction was in the range of 40% to 45%. Akinesis of the apical myocardium. - Ventricular septum: The contour showed systolic flattening. These changes are consistent with RV volume and pressure overload. - Aortic valve: Trileaflet; normal thickness,  mildly calcified leaflets. Transvalvular velocity was increased. There was mild stenosis. Valve area (VTI): 1.28 cm^2. Valve area (Vmax): 1.35 cm^2. Valve area (Vmean): 1.21 cm^2. - Aortic root: The aortic root was mildly dilated. - Mitral valve: Mildly calcified annulus. There was moderate to severe regurgitation. - Left atrium: The atrium was severely dilated. - Right ventricle: The cavity size was dilated. Wall thickness was normal. - Right atrium: The atrium was severely dilated. - Tricuspid valve: There was moderate regurgitation. - Pulmonary arteries: Systolic pressure could not be accurately estimated. Suspect it is higher because of other characteristics. PA peak pressure: 33 mm Hg (S).  12/03/16 Stress test:  Defect in distal anterior, distal inferior and apical walls consistent with small region of scar. Inferior thinning consistent with soft tissue attenuation and/or scar No ischemia.  Nuclear stress EF: 46%.  This is an intermediate risk study.  Patient Profile     84 y.o. male PMH of CAD s/p CABG1998, h/o ICM with improved EF, prior CVA, prior mural thrombus on chronic coumadin Rx, prostate CA, and h/o GI bleed.He had a cardiac catheterization in June 2013 that showed 100%ostLAD, distal LAD filled with RV branch off of RCA, PLA possibly occluded by most distal RCA appears to be filled from LIMA collateral, LIMA patent but does not supply distal LAD. Medical therapy was recommended. Last echocardiogram obtained on 11/13/2013 showed EF 50-55%, akinesis of the entire apical myocardium, grade 1 DD, PA peak pressure 56 mmHg. Ejection fraction has improved from the previous 40-45% in 2007. He is intolerant to all beta blockers.Myoview obtained on 11/02/2016 showed EF 46%, defect in the distal anterior, distal inferior, apical wall consistent with small regional scar, inferior thinning consistent with soft tissue attenuation. Otherwise no ischemia.  Pt presented  from the AF clinic with weight gain and worsening SOB which had been ongoing since early 05/2017.  Assessment & Plan    1. Persistent Atrial fibrillation: -DCCV in 03/2019initially successful but converted back to AF within a few days -Leftand rightatrium severely enlargedper echocardiogram making if potentially difficultto restore and maintain NSR -Onamiodarone 200 mgPO BID>>>which has now been uptitrated to 400mg  BID secondary to second unsuccessful DCCV on 07/15/17 -On eliquis 2.5mg  bid, CHADS2Vasc score is 4 -Back into afib on 07/17/17 -Bisoprolol stopped secondary to low bp's (held for last several days) -Plan is for repeat DCCV per EP recommendation (07/18/17) however schedule would not allow for today>>>will schedule for tomorrow, 07/20/17 -Amiodarone was increased to 400mg  bid, and ranexa started to augment the affects of amiodarone   2. Acute on chronic combined systolic/diastolic HF: -Per echo with LVEF 40-45%, mild AS, mod to severe MR -Reports that he has been SOB since his dx of AF in  March 2019. He had a recent hospitalization in early April in which he states he has not been back to his baseline. -Weight, 180lb today, 182lb on 07/18/17 -I&O, net negative 3.6 since admission, total 24H output 2.3L -Does not appear fluid volume overloaded on exam  -No ACE/ARB/ARNI due to poor renal function -On lasix 40mg  IV bid, had received some oral doses of metolazone earlier this admission. AM lasix held yesterday due to soft bp's, got pm dose>>> again BP soft and AM Lasix held   3. CAD: -History of prior CABG -Denies recent chest pain and other anginal symptoms -BB, statin  4. CKD II: -Nephrology consulted -Creatinine improving, 1.77 today. Downtrending  -Last hospitalization 1.36>1.32>1.63 (07/05/17)>2.02 on admission    5. Mitral regurgitation: -Mod to severe per echo 06/13/17 -May be playing a large part in his fluid issues>>MD to review  -Echo reviewed, agree  moderate to severe MR. RV enlarged with ventricular diastolic flattending consistent with RV volume overload, probably severe TR. RV severely enlarged, RV TAPSE of 1.2 cm would suggest moderate dysfunction.    SignedKathyrn Drown NP-C [facility] Pager: [phone] 07/19/2017, 12:02 PM     Patient seen and examined. Agree with assessment and plan. No chest pain or dyspnea.  Not able to have cardioversion today due to schedule; re-scheduled for tomorrow.  Now on amiodarone 400 mg bid; AF rate controlled.  Cr 1.77 today. On reduced dose eliqus with age and Cr > 1.5. Will f/u BNP, Mg. Bmet in am.   Troy Sine, MD, [facility] 07/19/2017 12:54 PM  For questions or updates, please contact   Please consult [email or web address] for contact info under Cardiology/STEMI.

## 2017-07-19 NOTE — Progress Notes (Addendum)
Patient's BP was 73/54.  Pt is not symptomatic Paged MD MD returned call- will hold lasix until they can get up to see patient

## 2017-07-19 NOTE — Progress Notes (Signed)
Patient refused am weight. Wishes to get weighed at breakfast. Will pass on in report to day RN to obtain.

## 2017-07-20 ENCOUNTER — Inpatient Hospital Stay (HOSPITAL_COMMUNITY): Payer: Medicare Other | Admitting: Certified Registered"

## 2017-07-20 ENCOUNTER — Encounter (HOSPITAL_COMMUNITY)
Admission: AD | Disposition: A | Discharge status: Home / Self Care | Payer: Self-pay | Source: Ambulatory Visit | Attending: Cardiology

## 2017-07-20 DIAGNOSIS — Z9889 Other specified postprocedural states: Secondary | ICD-10-CM

## 2017-07-20 DIAGNOSIS — I4819 Other persistent atrial fibrillation: Secondary | ICD-10-CM

## 2017-07-20 DIAGNOSIS — Z9289 Personal history of other medical treatment: Secondary | ICD-10-CM

## 2017-07-20 HISTORY — PX: CARDIOVERSION: SHX1299

## 2017-07-20 LAB — BASIC METABOLIC PANEL
Anion gap: 8 (ref 5–15)
BUN: 33 mg/dL — ABNORMAL HIGH (ref 6–20)
CHLORIDE: 100 mmol/L — AB (ref 101–111)
CO2: 28 mmol/L (ref 22–32)
Calcium: 9.1 mg/dL (ref 8.9–10.3)
Creatinine, Ser: 1.64 mg/dL — ABNORMAL HIGH (ref 0.61–1.24)
GFR calc Af Amer: 41 mL/min — ABNORMAL LOW (ref >60)
GFR, EST NON AFRICAN AMERICAN: 35 mL/min — AB (ref >60)
Glucose, Bld: 116 mg/dL — ABNORMAL HIGH (ref 65–99)
Potassium: 4 mmol/L (ref 3.5–5.1)
SODIUM: 136 mmol/L (ref 135–145)

## 2017-07-20 LAB — MAGNESIUM: MAGNESIUM: 1.7 mg/dL (ref 1.7–2.4)

## 2017-07-20 LAB — BRAIN NATRIURETIC PEPTIDE: B NATRIURETIC PEPTIDE 5: 980.9 pg/mL — AB (ref 0.0–100.0)

## 2017-07-20 SURGERY — CARDIOVERSION
Anesthesia: General

## 2017-07-20 MED ORDER — MAGNESIUM SULFATE 2 GM/50ML IV SOLN
2.0000 g | Freq: Once | INTRAVENOUS | Status: AC
  Administered 2017-07-20: 2 g via INTRAVENOUS
  Filled 2017-07-20: qty 50

## 2017-07-20 MED ORDER — LIDOCAINE HCL (CARDIAC) PF 100 MG/5ML IV SOSY
PREFILLED_SYRINGE | INTRAVENOUS | Status: DC | PRN
  Administered 2017-07-20: 40 mg via INTRAVENOUS

## 2017-07-20 MED ORDER — SODIUM CHLORIDE 0.9 % IV SOLN
INTRAVENOUS | Status: DC | PRN
  Administered 2017-07-20: 12:00:00 via INTRAVENOUS

## 2017-07-20 MED ORDER — PROPOFOL 10 MG/ML IV BOLUS
INTRAVENOUS | Status: DC | PRN
  Administered 2017-07-20: 50 mg via INTRAVENOUS

## 2017-07-20 NOTE — Anesthesia Preprocedure Evaluation (Signed)
Anesthesia Evaluation  Patient identified by MRN, date of birth, ID band Patient awake    Reviewed: Allergy & Precautions, H&P , NPO status , Patient's Chart, lab work & pertinent test results, reviewed documented beta blocker date and time   Airway Mallampati: III  TM Distance: >3 FB Neck ROM: Full    Dental no notable dental hx. (+) Teeth Intact, Dental Advisory Given   Pulmonary asthma ,    Pulmonary exam normal breath sounds clear to auscultation       Cardiovascular + CAD, + CABG and +CHF  + dysrhythmias Atrial Fibrillation  Rhythm:Irregular Rate:Normal     Neuro/Psych negative neurological ROS  negative psych ROS   GI/Hepatic negative GI ROS, Neg liver ROS,   Endo/Other  negative endocrine ROS  Renal/GU Renal InsufficiencyRenal disease  negative genitourinary   Musculoskeletal  (+) Arthritis , Osteoarthritis,    Abdominal   Peds  Hematology negative hematology ROS (+) anemia ,   Anesthesia Other Findings 45% EF  Reproductive/Obstetrics negative OB ROS                             Anesthesia Physical  Anesthesia Plan  ASA: III  Anesthesia Plan: General   Post-op Pain Management:    Induction: Intravenous  PONV Risk Score and Plan: 2 and Treatment may vary due to age or medical condition  Airway Management Planned: Mask  Additional Equipment:   Intra-op Plan:   Post-operative Plan:   Informed Consent: I have reviewed the patients History and Physical, chart, labs and discussed the procedure including the risks, benefits and alternatives for the proposed anesthesia with the patient or authorized representative who has indicated his/her understanding and acceptance.   Dental advisory given  Plan Discussed with: CRNA  Anesthesia Plan Comments:         Anesthesia Quick Evaluation

## 2017-07-20 NOTE — Progress Notes (Addendum)
Progress Note  Patient Name: Donald [name] Date of Encounter: 07/20/2017  Primary Cardiologist: Dr. [name]  Subjective   Pt doing well this AM. Awaiting DCCV today. Denies SOB or CP this AM.   Inpatient Medications    Scheduled Meds: . amiodarone  400 mg Oral BID  . apixaban  2.5 mg Oral BID  . cholecalciferol  2,000 Units Oral Daily  . furosemide  40 mg Intravenous BID  . latanoprost  1 drop Left Eye QHS  . potassium chloride  20 mEq Oral Daily  . ranolazine  500 mg Oral BID  . sodium chloride flush  3 mL Intravenous Q12H  . sodium chloride flush  3 mL Intravenous Q12H  . sodium chloride flush  3 mL Intravenous Q12H  . tamsulosin  0.4 mg Oral QPM   Continuous Infusions: . sodium chloride    . sodium chloride 500 mL (07/15/17 0856)  . sodium chloride 250 mL (07/19/17 0617)   PRN Meds: sodium chloride, acetaminophen, albuterol, fluticasone furoate-vilanterol, hydrocortisone cream, sodium chloride flush, sodium chloride flush, sodium chloride flush   Vital Signs    Vitals:   07/19/17 1700 07/19/17 2043 07/20/17 0426 07/20/17 0808  BP: (!) 87/67 107/87 105/82 104/78  Pulse: 82 77 61 67  Resp: 18 18 18    Temp: (!) 97.5 F (36.4 C) (!) 97.5 F (36.4 C) (!) 97.5 F (36.4 C)   TempSrc: Oral Oral Oral   SpO2: 99% 96% 95%   Weight:   178 lb 3.2 oz (80.8 kg)   Height:        Intake/Output Summary (Last 24 hours) at 07/20/2017 1117 Last data filed at 07/20/2017 1037 Gross per 24 hour  Intake 243 ml  Output 2975 ml  Net -2732 ml   Filed Weights   07/18/17 0544 07/19/17 0509 07/20/17 0426  Weight: 182 lb 3.2 oz (82.6 kg) 180 lb 4.8 oz (81.8 kg) 178 lb 3.2 oz (80.8 kg)   Physical Exam   General: Well developed, well nourished, NAD Skin: Warm, dry, intact  Head: Normocephalic, atraumatic,  clear, moist mucus membranes. Neck: Negative for carotid bruits. No JVD Lungs:Clear to ausculation bilaterally. No wheezes, rales, or rhonchi. Breathing is  unlabored. Cardiovascular: Irregularly irregular with S1 S2. No murmurs, rubs or gallops Abdomen: Soft, non-tender, non-distended with normoactive bowel sounds. No obvious abdominal masses. MSK: Strength and tone appear normal for age. 5/5 in all extremities Extremities: No edema. No clubbing or cyanosis. DP/PT pulses 2+ bilaterally Neuro: Alert and oriented. No focal deficits. No facial asymmetry. MAE spontaneously. Psych: Responds to questions appropriately with normal affect.    Labs    Chemistry Recent Labs  Lab 07/18/17 0827 07/19/17 0713 07/20/17 0607  NA 137 135 136  K 3.4* 3.9 4.0  CL 95* 99* 100*  CO2 31 29 28   GLUCOSE 95 101* 116*  BUN 37* 35* 33*  CREATININE 1.84* 1.77* 1.64*  CALCIUM 9.1 8.9 9.1  GFRNONAA 31* 32* 35*  GFRAA 36* 37* 41*  ANIONGAP 11 7 8      Hematology Recent Labs  Lab 07/13/17 1609 07/14/17 0445  WBC 4.6 4.9  RBC 4.21* 3.95*  HGB 13.0 12.0*  HCT 39.5 36.7*  MCV 93.8 92.9  MCH 30.9 30.4  MCHC 32.9 32.7  RDW 15.3 15.0  PLT 180 179    Cardiac EnzymesNo results for input(s): TROPONINI in the last 168 hours. No results for input(s): TROPIPOC in the last 168 hours.   BNP Recent Labs  Lab 07/14/17 0445 07/20/17 0607  BNP 1,463.4* 980.9*     DDimer No results for input(s): DDIMER in the last 168 hours.   Radiology    No results found.  Telemetry    07/20/17 Atrial fibrillation - Personally Reviewed  ECG    No new tracing as of 07/20/17- Personally Reviewed  Cardiac Studies   Echocardiogram 06/13/17: Study Conclusions  - Left ventricle: The cavity size was normal. Wall thickness was increased in a pattern of mild LVH. Systolic function was mildly to moderately reduced. The estimated ejection fraction was in the range of 40% to 45%. Akinesis of the apical myocardium. - Ventricular septum: The contour showed systolic flattening. These changes are consistent with RV volume and pressure overload. - Aortic valve:  Trileaflet; normal thickness, mildly calcified leaflets. Transvalvular velocity was increased. There was mild stenosis. Valve area (VTI): 1.28 cm^2. Valve area (Vmax): 1.35 cm^2. Valve area (Vmean): 1.21 cm^2. - Aortic root: The aortic root was mildly dilated. - Mitral valve: Mildly calcified annulus. There was moderate to severe regurgitation. - Left atrium: The atrium was severely dilated. - Right ventricle: The cavity size was dilated. Wall thickness was normal. - Right atrium: The atrium was severely dilated. - Tricuspid valve: There was moderate regurgitation. - Pulmonary arteries: Systolic pressure could not be accurately estimated. Suspect it is higher because of other characteristics. PA peak pressure: 33 mm Hg (S).  12/03/16 Stress test:  Defect in distal anterior, distal inferior and apical walls consistent with small region of scar. Inferior thinning consistent with soft tissue attenuation and/or scar No ischemia.  Nuclear stress EF: 46%.  This is an intermediate risk study.  Patient Profile     84 y.o. male PMH of CAD s/p CABG1998, h/o ICM with improved EF, prior CVA, prior mural thrombus on chronic coumadin Rx, prostate CA, and h/o GI bleed.He had a cardiac catheterization in June 2013 that showed 100%ostLAD, distal LAD filled with RV branch off of RCA, PLA possibly occluded by most distal RCA appears to be filled from LIMA collateral, LIMA patent but does not supply distal LAD. Medical therapy was recommended. Last echocardiogram obtained on 11/13/2013 showed EF 50-55%, akinesis of the entire apical myocardium, grade 1 DD, PA peak pressure 56 mmHg. Ejection fraction has improved from the previous 40-45% in 2007. He is intolerant to all beta blockers.Myoview obtained on 11/02/2016 showed EF 46%, defect in the distal anterior, distal inferior, apical wall consistent with small regional scar, inferior thinning consistent with soft tissue attenuation. Otherwise  no ischemia.  Pt presented from the AF clinic with weight gain and worsening SOB which had been ongoing since early 05/2017.  Assessment & Plan    1. Persistent Atrial fibrillation: -DCCV in 03/2019initially successful but converted back to AF within a few days -Leftand rightatrium severely enlargedper echocardiogram making if potentially difficultto restore and maintain NSR -Onamiodarone 200 mgPO BID>>>which has now been uptitrated to 400mg  BID secondary to second unsuccessful DCCV on 07/15/17>>plan is for subsequent DCCV today, 07/20/17 -Plan is for repeat DCCV per EP recommendation (07/18/17) however schedule would not allow for today>>>will schedule for tomorrow, 07/20/17 On Eliquis 2.5mg  BID>>>increase to 5mg  PO given improvement in renal function -Bisoprolol stopped secondary to low bp's (held for last several days) -Amiodarone was increased to 400mg  bid, and ranexa started to augment the affects of Amiodarone -CHADS2Vasc score is 4   2. Acute on chronic combined systolic/diastolic HF: -Per echo with LVEF 40-45%, mild AS, mod to severe MR -Reports that he  has been SOB since his dx of AF in March 2019. He had a recent hospitalization in early April in which he states he has not been back to his baseline. -Weight, 178lb today, 180lb on 04/222/19 -I&O, net negative 1.2L since admission, total 24H output 1.2L -Does not appear fluid volume overloaded on exam  -No ACE/ARB/ARNI due to poor renal function -On Lasix 40mg  IV bid,had received some oral doses of metolazone earlier this admission.   3. CAD: -History of prior CABG -Denies recent chest pain and other anginal symptoms -BB, statin  4. CKD II: -Nephrology consulted -Creatinine improving, 1.64 today. Downtrending from 1.77 on 07/19/17  -Last hospitalization 1.36>1.32>1.63 (07/05/17)>2.02 on admission   5. Mitral regurgitation: -Mod to severe per echo 06/13/17 -May be playing a large part in his fluid  issues>>MD to review  -Echo reviewed, agree moderate to severe MR. RV enlarged with ventricular diastolic flattending consistent with RV volume overload, probably severe TR. RV severely enlarged, RV TAPSE of 1.2 cm would suggest moderate dysfunction.   Signed, Kathyrn Drown NP-C [facility] Pager: [phone] 07/20/2017, 11:17 AM    Patient seen and examined. Agree with assessment and plan.  Underwent successful DC cardioversion earlier today with restoration of sinus rhythm following 150 J synchronized countershock according to Dr. Patrice Paradise and Roanna Raider note.  However, the patient states that the cardioversion was not successful.  At present, it is hard to discern if P waves are present although ventricular rate seems more regular at about 77 bpm.  We will recheck ECG tonight and again in a.m.  He is continuing to be on amiodarone 400 mg twice a day, Ranexa 500 mg twice daily, and Eliquis.   Troy Sine, MD, [facility] 07/20/2017 5:52 PM     For questions or updates, please contact   Please consult [email or web address] for contact info under Cardiology/STEMI.

## 2017-07-20 NOTE — Anesthesia Postprocedure Evaluation (Signed)
Anesthesia Post Note  Patient: Donald [name]  Procedure(s) Performed: CARDIOVERSION (N/A )     Patient location during evaluation: Endoscopy Anesthesia Type: General Level of consciousness: awake and sedated Pain management: pain level controlled Vital Signs Assessment: post-procedure vital signs reviewed and stable Respiratory status: spontaneous breathing Cardiovascular status: stable Postop Assessment: no apparent nausea or vomiting Anesthetic complications: no    Last Vitals:  Vitals:   07/20/17 1146 07/20/17 1245  BP: 122/79 108/76  Pulse:  (!) 105  Resp: (!) 21 18  Temp: (!) 36.4 C   SpO2: 96% 98%    Last Pain:  Vitals:   07/20/17 1245  TempSrc:   PainSc: 0-No pain   Pain Goal:                 Donald [name],Donald [name]

## 2017-07-20 NOTE — Transfer of Care (Signed)
Immediate Anesthesia Transfer of Care Note  Patient: Donald [name]  Procedure(s) Performed: CARDIOVERSION (N/A )  Patient Location: Endoscopy Unit  Anesthesia Type:General  Level of Consciousness: awake, alert , oriented and drowsy  Airway & Oxygen Therapy: Patient Spontanous Breathing  Post-op Assessment: Report given to RN, Post -op Vital signs reviewed and stable and Patient moving all extremities X 4  Post vital signs: Reviewed and stable  Last Vitals:  Vitals Value Taken Time  BP 108/76 07/20/2017 12:46 PM  Temp    Pulse 85 07/20/2017 12:46 PM  Resp 15 07/20/2017 12:46 PM  SpO2 98 % 07/20/2017 12:46 PM  Vitals shown include unvalidated device data.  Last Pain:  Vitals:   07/20/17 1146  TempSrc: Oral  PainSc: 0-No pain         Complications: No apparent anesthesia complications

## 2017-07-20 NOTE — Progress Notes (Signed)
[facility] Progress Note    Assessment/ Plan:   1.  AKI on CKD stage III: Cr improved, 1.64.  Likely related to uncontrolled A. fib given gradual rise since 05/2017 during onset of A. fib.  Continues to have adequate UOP, -2.050 last 24 hours.  On Lasix 80 mg IV twice daily with most recent dose this morning, intermittently holding given low BP.  Renal US without obstruction.  DCCV scheduled for/23.  Hopeful creatinine will continue to improve with anticipated resolution of A. Fib.  2.  A. Fib:  S/p DCCV 4/18 with temporary NSR requiring second DCCV, however patient is again in A. fib and will require his third cardioversion scheduled 4/23.  Remains on amiodarone and Ranexa.  Anticoagulated on Eliquis.  Per cardiology.  3.  Acute on chronic systolic heart failure: EF 45-50%.  Plan per above.  Will need to correct A. Fib.  Per cardiology.  4.  Moderate to severe MR: Seen on TTE 05/2017.  Subjective:   Patient doing well. Hopeful a-fib will be corrected by procedure today. No new complaints. Denies SOB or CP.   Objective:   BP 104/78   Pulse 67   Temp (!) 97.5 F (36.4 C) (Oral)   Resp 18   Ht 5\' 9"  (1.753 m)   Wt 178 lb 3.2 oz (80.8 kg)   SpO2 95%   BMI 26.32 kg/m   Intake/Output Summary (Last 24 hours) at 07/20/2017 5176 Last data filed at 07/20/2017 0231 Gross per 24 hour  Intake 240 ml  Output 1725 ml  Net -1485 ml   Weight change: -2 lb 1.6 oz (-0.953 kg)  Physical Exam: General: elderly male, well nourished, well developed, NAD with non-toxic appearance HEENT: normocephalic, atraumatic, moist mucous membranes Neck: supple, non-tender without lymphadenopathy, no JVD Cardiovascular: regular rate and rhythm without murmurs, rubs, or gallops Lungs: clear to auscultation bilaterally with normal work of breathing Abdomen: soft, non-tender, non-distended, normoactive bowel sounds Skin: warm, dry, no rashes or lesions, cap refill < 2 seconds Extremities: warm  and well perfused, normal tone, no edema  Imaging: No results found.  Labs: BMET Recent Labs  Lab 07/14/17 0445 07/15/17 0424 07/16/17 0542 07/17/17 0525 07/18/17 0827 07/19/17 0713 07/20/17 0607  NA 139 140 139 137 137 135 136  K 4.1 4.1 4.4 3.3* 3.4* 3.9 4.0  CL 105 106 102 98* 95* 99* 100*  CO2 23 26 26 29 31 29 28   GLUCOSE 137* 113* 114* 85 95 101* 116*  BUN 40* 40* 43* 44* 37* 35* 33*  CREATININE 2.05* 2.08* 2.11* 1.96* 1.84* 1.77* 1.64*  CALCIUM 9.0 9.0 9.0 8.9 9.1 8.9 9.1   CBC Recent Labs  Lab 07/13/17 1609 07/14/17 0445  WBC 4.6 4.9  NEUTROABS  --  3.5  HGB 13.0 12.0*  HCT 39.5 36.7*  MCV 93.8 92.9  PLT 180 179    Medications:    . amiodarone  400 mg Oral BID  . apixaban  2.5 mg Oral BID  . cholecalciferol  2,000 Units Oral Daily  . furosemide  40 mg Intravenous BID  . latanoprost  1 drop Left Eye QHS  . potassium chloride  20 mEq Oral Daily  . ranolazine  500 mg Oral BID  . sodium chloride flush  3 mL Intravenous Q12H  . sodium chloride flush  3 mL Intravenous Q12H  . sodium chloride flush  3 mL Intravenous Q12H  . tamsulosin  0.4 mg Oral QPM     Shanon Brow  Yisroel Ramming, DO, PGY-2

## 2017-07-20 NOTE — Discharge Summary (Addendum)
Discharge Summary    Patient ID: Donald [name],  MRN: [id number], DOB/AGE: 09/19/1926 84 y.o.  Admit date: 07/13/2017 Discharge date: 07/22/2017  Primary Care Provider: Haywood Pao Primary Cardiologist: Kirk Ruths, MD  Discharge Diagnoses    Principal Problem:   Persistent atrial fibrillation [facility]) Active Problems:   AKI (acute kidney injury) ([place])   Acute combined systolic and diastolic congestive heart failure ([place] [name])   Acute on chronic systolic CHF (congestive heart failure) ([place])   History of cardioversion  Allergies Allergies  Allergen Reactions  . Keflex [Cephalexin] Nausea And Vomiting and Other (See Comments)    'Killed all GI enzymes" (also) and patient prefers to not take this  . Morphine Nausea Only    Diagnostic Studies/Procedures   2D Echo 06/13/17 (prior to admit) Study Conclusions  - Left ventricle: The cavity size was normal. Wall thickness was   increased in a pattern of mild LVH. Systolic function was mildly   to moderately reduced. The estimated ejection fraction was in the   range of 40% to 45%. Akinesis of the apical myocardium. - Ventricular septum: The contour showed systolic flattening. These   changes are consistent with RV volume and pressure overload. - Aortic valve: Trileaflet; normal thickness, mildly calcified   leaflets. Transvalvular velocity was increased. There was mild   stenosis. Valve area (VTI): 1.28 cm^2. Valve area (Vmax): 1.35   cm^2. Valve area (Vmean): 1.21 cm^2. - Aortic root: The aortic root was mildly dilated. - Mitral valve: Mildly calcified annulus. There was moderate to   severe regurgitation. - Left atrium: The atrium was severely dilated. - Right ventricle: The cavity size was dilated. Wall thickness was   normal. - Right atrium: The atrium was severely dilated. - Tricuspid valve: There was moderate regurgitation. - Pulmonary arteries: Systolic pressure could not be accurately   estimated. Suspect it  is higher because of other characteristics.   PA peak pressure: 33 mm Hg (S).    History of Present Illness    Donald [name] is a 84 yo male with a h/o combined systolic and diastolic heart failure with EF of 40-45% (per echo in 05/2017), persistent atrial fibrillation on Eliquis (prior DCCV on 05/2017), asthma, CAD and CVA who is followed by Dr. [name] and the afib clinic. He was last seen on day of admission, 07/13/17, in the Afib clinic, for the evaluation of continued shortness of breath and weight gain.   Per Afib clinic notes, he is highly functioning for his age and is not aware of the rapid HR, just the shortness of breath. He has been noticing these symptoms for about one month. He was placed on Eliquis 5 mg BID given a CHADVASC score of at least 4. His weight was 180 lbs in a hospital gown when he had lithotripsy 04/29/17. At his most recent clinic visit, his weight was 197lb. Most of the shortness of breath is with exertion. He denies excessive caffeine, alcohol, tobacco and denies snoring.   At Afib clinic follow up, his HR had some improvement with the addition of bisoprolol 5 mg. At that time, he denied any missed doses of his Eliquis for at least  3 weeks. Plans were made to start diuretics and load with Amiodarone and schedule for outpatient cardioversion with Dr. [name] on 07/22/17.   Hospital Course    On 07/13/17 the pt presented to [facility] with worsening symptoms despite an increase in Lasix dose to 40 mg BID for several days.  He reports that his weight was up  4-6 lbs over the course of one week. He was feeling miserable with increase in abdominal girth and was short of breath. His last creatinine was at 1.6. A repeat BMET showed a creatinine of 2.0. Unfortunately, given his persistent symptoms, he was admitted as inpatient for IV diuretics and DCCV.   He presented to [facility] on 07/13/17 with the above symptoms. He remained in atrial fibrillation   1.PersistentAtrial  fibrillation: -DCCV in 03/2019initially successful but converted back to AF within a few days -Leftand rightatrium severely enlargedper echocardiogram making if potentially difficultto restore and maintain NSR -PO amiodarone 200 mgPO BID added>>>which has now been uptitrated to 400mg  BID secondary to second unsuccessful DCCV on 07/15/17>>he had repeat cardioversion on 07/20/17, this time successful.  On Eliquis 2.5mg  BID due to renal function and K (reduced previous home dose from 5 to 2.5).  -Bisoprolol stoppedsecondary tolow bp's  -Amiodarone was increased to 400mg  bid, and ranexa started to augment the affects of Amiodarone -CHADS2Vasc score is 4  -- He will f/u in the afib clinic post d/c and will have his amiodarone dose weaned down  2. Acute on chronic combined systolic/diastolic HF: --Per echo withLVEF 40-45%, mild AS, mod to severe MR -- Treated with IV Lasix with good urinary response. Net I/Os negative 8.5L. Weight improved from 186 lb on admit to 173 lb day of discharge. He was transitioned from IV to PO Lasix, 40 mg BID (previously on 60 mg/day prior to admit). Renal function improved with diuresis.  Prior to d/c, we asked the AHF team to check volume status with. We asked the AHF team to help check volume status with vest. Report was given that his measurements were good and that he was dry.  -- Pt discharged on 40 mg Lasix BID, also recommended dose per nephrology --No ACE/ARB/ARNI due to renal insuffiencey -- D/c Weight 173 lb  3. CAD: -History of prior CABG -Denies recent chest pain and other anginal symptoms -BB, statin  4. CKD II: -Nephrology consulted -Creatinine improved with diuresis, down from 1.77 to 1.45 day of discharge.  -Last hospitalization 1.36>1.32>1.63 (07/05/17)>2.02 on admission - Nephrology assisted with diuretic dosing and recommended he continue 40 mg Lasix BID.  --He will need f/u BMP in 7 days and f/u with Dr. Hollie Salk in 1-2 weeks.   5.  Mitral regurgitation: -Mod to severeper echo 06/13/17  On 07/22/17, pt was seen and examined by Dr. Claiborne Billings, who determined he was stable for discharge home. Hospital f/u has been arranged in the atrial fibrillation clinic in 1 week on 07/29/17. F/u will be set up in general cardiology clinic for f/u for HF. He will need to f/u with nephrology in 1-2 weeks.   He will get a repeat BMP on 5/2 at [facility]. Per Dr. Claiborne Billings,  "We will plan discharge today on amiodarone 400 mg twice a day until next week when dose should be reduced to 400 mg daily.  Repeat laboratory will need to be checked next week.  If renal function stays improved and creatinine continues to be below 1.5 will need to titrate  Eliquis to 5 mg twice a day" (see progress note 07/22/17.    Consultants: EP, nephrology     Discharge Vitals Blood pressure 108/70, pulse 70, temperature 97.6 F (36.4 C), temperature source Oral, resp. rate 20, height 5\' 9"  (1.753 m), weight 173 lb 6.4 oz (78.7 kg), SpO2 95 %.  Filed Weights   07/20/17 0426  07/21/17 0607 07/22/17 0540  Weight: 178 lb 3.2 oz (80.8 kg) 174 lb 14.4 oz (79.3 kg) 173 lb 6.4 oz (78.7 kg)   Labs & Radiologic Studies    CBC Recent Labs    07/21/17 0513  WBC 5.1  HGB 12.6*  HCT 37.4*  MCV 90.1  PLT 672   Basic Metabolic Panel Recent Labs    07/20/17 0607 07/21/17 0513 07/22/17 0407  NA 136 137 136  K 4.0 4.0 3.7  CL 100* 99* 99*  CO2 28 28 27   GLUCOSE 116* 109* 98  BUN 33* 31* 27*  CREATININE 1.64* 1.60* 1.45*  CALCIUM 9.1 9.1 8.9  MG 1.7 2.1  --    Liver Function Tests No results for input(s): AST, ALT, ALKPHOS, BILITOT, PROT, ALBUMIN in the last 72 hours. No results for input(s): LIPASE, AMYLASE in the last 72 hours. Cardiac Enzymes No results for input(s): CKTOTAL, CKMB, CKMBINDEX, TROPONINI in the last 72 hours. BNP Invalid input(s): POCBNP D-Dimer No results for input(s): DDIMER in the last 72 hours. Hemoglobin A1C No results for input(s): HGBA1C  in the last 72 hours. Fasting Lipid Panel No results for input(s): CHOL, HDL, LDLCALC, TRIG, CHOLHDL, LDLDIRECT in the last 72 hours. Thyroid Function Tests No results for input(s): TSH, T4TOTAL, T3FREE, THYROIDAB in the last 72 hours.  Invalid input(s): FREET3 _____________  US Renal  Result Date: 07/16/2017 CLINICAL DATA:  Fluid overload.  Renal insufficiency. EXAM: RENAL / URINARY TRACT ULTRASOUND COMPLETE COMPARISON:  03/12/2017 CT FINDINGS: Right Kidney: Length: 11.0 cm. Normal renal echogenicity and cortical thickness for age. No hydronephrosis. Lower pole 1.6 cm right renal cyst. Left Kidney: Length: 9.8 cm. Suspect mild left renal atrophy. Normal echogenicity. No hydronephrosis. Bladder: Appears normal for degree of bladder distention. Note is made of bilateral pleural effusions. IMPRESSION: 1. No hydronephrosis or other explanation for acute renal insufficiency. The left kidney may be mildly atrophic. 2. Bilateral pleural effusions. Electronically Signed   By: Abigail Miyamoto M.D.   On: 07/16/2017 19:57   Dg Chest Port 1 View  Result Date: 07/14/2017 CLINICAL DATA:  Chronic shortness of breath EXAM: PORTABLE CHEST 1 VIEW COMPARISON:  06/10/2017 FINDINGS: Cardiac shadow is at the upper limits of normal in size. Post surgical changes are noted and stable. No sizable infiltrate or effusion is noted. No bony abnormality is seen. IMPRESSION: No acute abnormality noted. Electronically Signed   By: Inez Catalina M.D.   On: 07/14/2017 12:24   Disposition   Pt is being discharged home today in good condition.  Follow-up Plans & Appointments    Follow-up Information    Sherran Needs, NP Follow up on 07/29/2017.   Specialties:  Nurse Practitioner, Cardiology Why:  2:00 PM (Garage Code for Access to Parking is 1200 for Month of May) Contact information: [place] [address] [phone]        Lelon Perla, MD Follow up.   Specialty:  Cardiology Why:  our office will  call you with a hospital follow-up visit in 1-2 weeks  Contact information: [place] [address] [phone]        Schedule an appointment as soon as possible for a visit with [name], MD.   Specialty:  Nephrology Contact information: [place] [address] [phone]        [name], [name], MD.   Specialty:  Internal Medicine Why:  Office will call Contact information: [place]  [address] [phone]          Discharge Instructions    Diet - low sodium heart healthy   Complete by:  As directed    Increase activity slowly   Complete by:  As directed       Discharge Medications   Allergies as of 07/22/2017      Reactions   Keflex [cephalexin] Nausea And Vomiting, Other (See Comments)   'Killed all GI enzymes" (also) and patient prefers to not take this   Morphine Nausea Only      Medication List    STOP taking these medications   bisoprolol 5 MG tablet Commonly known as:  ZEBETA     TAKE these medications   amiodarone 200 MG tablet Commonly known as:  PACERONE Take 2 tablets (400 mg total) by mouth 2 (two) times daily. What changed:  how much to take   apixaban 2.5 MG Tabs tablet Commonly known as:  ELIQUIS Take 1 tablet (2.5 mg total) by mouth 2 (two) times daily. What changed:    medication strength  how much to take   BREO ELLIPTA 100-25 MCG/INH Aepb Generic drug:  fluticasone furoate-vilanterol Inhale 1 puff into the lungs daily as needed (shortness of breath).   fenofibrate micronized 200 MG capsule Commonly known as:  LOFIBRA Take 1 capsule (200 mg total) by mouth daily.   FLOMAX 0.4 MG Caps capsule Generic drug:  tamsulosin Take 0.4 mg by mouth every evening.   furosemide 40 MG tablet Commonly known as:  LASIX Take 1 tablet (40 mg total) by mouth 2 (two) times daily. What changed:    how much to take  when to take this   hydrocortisone cream 1 % Apply  1 application topically 3 (three) times daily as needed for itching (skin irritation).   latanoprost 0.005 % ophthalmic solution Commonly known as:  XALATAN Place 1 drop into the left eye at bedtime.   loratadine 10 MG tablet Commonly known as:  CLARITIN Take 10 mg by mouth daily.   multivitamin with minerals Tabs tablet Take 1 tablet by mouth daily.   Potassium Chloride ER 20 MEQ Tbcr Take 20 mEq by mouth daily.   pravastatin 40 MG tablet Commonly known as:  PRAVACHOL Take 1 tablet (40 mg total) by mouth at bedtime.   ranolazine 500 MG 12 hr tablet Commonly known as:  RANEXA Take 1 tablet (500 mg total) by mouth 2 (two) times daily.   Vitamin D 2000 units tablet Take 2,000 Units by mouth daily.      Outstanding Labs/Studies   F/u BMP on 07/29/17   Duration of Discharge Encounter   Greater than 30 minutes including physician time.  Signed,  Lyda Jester, PA-C 07/22/2017, 4:43 PM

## 2017-07-20 NOTE — Interval H&P Note (Signed)
History and Physical Interval Note: No changes. Will attempt DCCV for atrial fibrillation.  07/20/2017 11:55 AM  Donald [name]  has presented today for surgery, with the diagnosis of AFIB  The various methods of treatment have been discussed with the patient and family. After consideration of risks, benefits and other options for treatment, the patient has consented to  Procedure(s): CARDIOVERSION (N/A) as a surgical intervention .  The patient's history has been reviewed, patient examined, no change in status, stable for surgery.  I have reviewed the patient's chart and labs.  Questions were answered to the patient's satisfaction.     [name]

## 2017-07-20 NOTE — CV Procedure (Signed)
Elective direct current cardioversion  Indication: Persistent atrial fibrillation  Description of procedure: After informed consent was obtained and preprocedure evaluation by Anesthesia, patient was taken to the procedure room. Timeout performed. Sedation was managed completely by the Anesthesia service, please refer to their documentation for details. Anterior and posterior chest pads were placed and connected to a biphasic defibrillator. A single synchronized shock of 150J was delivered resulting in restoration of sinus rhythm. The patient remained hemodynamically stable throughout, and there were no immediate complications noted. Follow-up ECG obtained.  Suresh Koneswaran, M.D., F.A.C.C.  

## 2017-07-21 ENCOUNTER — Encounter (HOSPITAL_COMMUNITY): Payer: Self-pay | Admitting: Cardiovascular Disease

## 2017-07-21 ENCOUNTER — Encounter (HOSPITAL_COMMUNITY): Payer: Self-pay

## 2017-07-21 LAB — BASIC METABOLIC PANEL
Anion gap: 10 (ref 5–15)
BUN: 31 mg/dL — AB (ref 6–20)
CALCIUM: 9.1 mg/dL (ref 8.9–10.3)
CO2: 28 mmol/L (ref 22–32)
CREATININE: 1.6 mg/dL — AB (ref 0.61–1.24)
Chloride: 99 mmol/L — ABNORMAL LOW (ref 101–111)
GFR calc Af Amer: 42 mL/min — ABNORMAL LOW (ref >60)
GFR calc non Af Amer: 37 mL/min — ABNORMAL LOW (ref >60)
Glucose, Bld: 109 mg/dL — ABNORMAL HIGH (ref 65–99)
Potassium: 4 mmol/L (ref 3.5–5.1)
SODIUM: 137 mmol/L (ref 135–145)

## 2017-07-21 LAB — CBC
HCT: 37.4 % — ABNORMAL LOW (ref 39.0–52.0)
Hemoglobin: 12.6 g/dL — ABNORMAL LOW (ref 13.0–17.0)
MCH: 30.4 pg (ref 26.0–34.0)
MCHC: 33.7 g/dL (ref 30.0–36.0)
MCV: 90.1 fL (ref 78.0–100.0)
PLATELETS: 177 10*3/uL (ref 150–400)
RBC: 4.15 MIL/uL — ABNORMAL LOW (ref 4.22–5.81)
RDW: 15.5 % (ref 11.5–15.5)
WBC: 5.1 10*3/uL (ref 4.0–10.5)

## 2017-07-21 LAB — MAGNESIUM: MAGNESIUM: 2.1 mg/dL (ref 1.7–2.4)

## 2017-07-21 MED ORDER — FUROSEMIDE 40 MG PO TABS
40.0000 mg | ORAL_TABLET | Freq: Two times a day (BID) | ORAL | Status: DC
  Administered 2017-07-21 – 2017-07-22 (×2): 40 mg via ORAL
  Filled 2017-07-21 (×2): qty 1

## 2017-07-21 NOTE — Progress Notes (Signed)
23 % Ruler 13 Ht 5'9" Wt 175 lbs

## 2017-07-21 NOTE — Plan of Care (Signed)
  Problem: Cardiac: Goal: Ability to achieve and maintain adequate cardiopulmonary perfusion will improve Outcome: Adequate for Discharge

## 2017-07-21 NOTE — Addendum Note (Signed)
Encounter addended by: Sherran Needs, NP on: 07/21/2017 9:30 AM  Actions taken: LOS modified

## 2017-07-21 NOTE — Plan of Care (Signed)
  Problem: Cardiac: Goal: Ability to achieve and maintain adequate cardiopulmonary perfusion will improve Outcome: Progressing   Problem: Clinical Measurements: Goal: Diagnostic test results will improve Outcome: Progressing

## 2017-07-21 NOTE — Progress Notes (Signed)
  [facility] Progress Note    Assessment/ Plan:   1.  AKI on CKD stage III: Cr improved, 1.60.  Likely related to uncontrolled A. fib given gradual rise since 05/2017 during onset of A. fib.  Continues to have adequate UOP, -2.55 last 24 hours.  On Lasix 40 mg IV twice daily.  Renal US without obstruction.  Hopeful creatinine will continue to improve with anticipated resolution of A. Fib.  2.  A. Fib:  S/p DCCV x3, last 4/23 with failed resolution.  Remains on amiodarone and Ranexa.  Anticoagulated on Eliquis.  Per cardiology.  3.  Acute on chronic systolic heart failure: EF 45-50%.  Plan per above.  Will need to correct A. Fib.  Per cardiology.  4.  Moderate to severe MR: Seen on TTE 05/2017.  Subjective:   Patient without complaints today. He denies SOB or CP. Disappointed the cardioversion did not work.   Objective:   BP 122/74 (BP Location: Right Arm)   Pulse 72   Temp (!) 97 F (36.1 C) (Oral)   Resp 18   Ht 5\' 9"  (1.753 m)   Wt 174 lb 14.4 oz (79.3 kg) Comment: scale a  SpO2 98%   BMI 25.83 kg/m   Intake/Output Summary (Last 24 hours) at 07/21/2017 0745 Last data filed at 07/21/2017 0600 Gross per 24 hour  Intake 493 ml  Output 2550 ml  Net -2057 ml   Weight change: -3 lb 4.8 oz (-1.497 kg)  Physical Exam: General: elderly male, well nourished, well developed, NAD with non-toxic appearance HEENT: normocephalic, atraumatic, moist mucous membranes Neck: supple, non-tender without lymphadenopathy, no JVD Cardiovascular: regular rate and rhythm without murmurs, rubs, or gallops Lungs: clear to auscultation bilaterally with normal work of breathing Abdomen: soft, non-tender, non-distended, normoactive bowel sounds Skin: warm, dry, no rashes or lesions, cap refill < 2 seconds Extremities: warm and well perfused, normal tone, no edema  Imaging: No results found.  Labs: BMET Recent Labs  Lab 07/15/17 0424 07/16/17 0542 07/17/17 0525 07/18/17 0827  07/19/17 0713 07/20/17 0607 07/21/17 0513  NA 140 139 137 137 135 136 137  K 4.1 4.4 3.3* 3.4* 3.9 4.0 4.0  CL 106 102 98* 95* 99* 100* 99*  CO2 26 26 29 31 29 28 28   GLUCOSE 113* 114* 85 95 101* 116* 109*  BUN 40* 43* 44* 37* 35* 33* 31*  CREATININE 2.08* 2.11* 1.96* 1.84* 1.77* 1.64* 1.60*  CALCIUM 9.0 9.0 8.9 9.1 8.9 9.1 9.1   CBC Recent Labs  Lab 07/21/17 0513  WBC 5.1  HGB 12.6*  HCT 37.4*  MCV 90.1  PLT 177    Medications:    . amiodarone  400 mg Oral BID  . apixaban  2.5 mg Oral BID  . cholecalciferol  2,000 Units Oral Daily  . furosemide  40 mg Intravenous BID  . latanoprost  1 drop Left Eye QHS  . potassium chloride  20 mEq Oral Daily  . ranolazine  500 mg Oral BID  . sodium chloride flush  3 mL Intravenous Q12H  . sodium chloride flush  3 mL Intravenous Q12H  . sodium chloride flush  3 mL Intravenous Q12H  . tamsulosin  0.4 mg Oral QPM     Donald [place], DO, PGY-2

## 2017-07-21 NOTE — Progress Notes (Addendum)
Progress Note  Patient Name: Donald [name] Date of Encounter: 07/21/2017  Primary Cardiologist: Kirk Ruths, MD   Subjective   Feeling better. No complaints. Denies dyspnea. No chest pain. He has felt occasional flutters since his cardioversion yesterday but currently no palpitations.   Inpatient Medications    Scheduled Meds: . amiodarone  400 mg Oral BID  . apixaban  2.5 mg Oral BID  . cholecalciferol  2,000 Units Oral Daily  . furosemide  40 mg Intravenous BID  . latanoprost  1 drop Left Eye QHS  . potassium chloride  20 mEq Oral Daily  . ranolazine  500 mg Oral BID  . sodium chloride flush  3 mL Intravenous Q12H  . sodium chloride flush  3 mL Intravenous Q12H  . sodium chloride flush  3 mL Intravenous Q12H  . tamsulosin  0.4 mg Oral QPM   Continuous Infusions: . sodium chloride    . sodium chloride 500 mL (07/15/17 0856)  . sodium chloride 500 mL (07/20/17 1154)   PRN Meds: sodium chloride, acetaminophen, albuterol, fluticasone furoate-vilanterol, hydrocortisone cream, sodium chloride flush, sodium chloride flush, sodium chloride flush   Vital Signs    Vitals:   07/20/17 1355 07/20/17 1744 07/20/17 2005 07/21/17 0607  BP: 104/60 117/82 125/85 122/74  Pulse: 72 72 76 72  Resp: 18   18  Temp: (!) 97.5 F (36.4 C)  97.9 F (36.6 C) (!) 97 F (36.1 C)  TempSrc: Oral  Oral Oral  SpO2: 97%  99% 98%  Weight:    174 lb 14.4 oz (79.3 kg)  Height:        Intake/Output Summary (Last 24 hours) at 07/21/2017 1213 Last data filed at 07/21/2017 0900 Gross per 24 hour  Intake 733 ml  Output 1300 ml  Net -567 ml   Filed Weights   07/19/17 0509 07/20/17 0426 07/21/17 0607  Weight: 180 lb 4.8 oz (81.8 kg) 178 lb 3.2 oz (80.8 kg) 174 lb 14.4 oz (79.3 kg)    Telemetry    NSR (small pwaves) - Personally Reviewed  ECG    NSR - Personally Reviewed  Physical Exam   GEN: No acute distress.   Neck: No JVD Cardiac: RRR, no murmurs, rubs, or gallops.    Respiratory: faint left crackles at the base GI: Soft, nontender, non-distended  MS: No edema; No deformity. Neuro:  Nonfocal  Psych: Normal affect   Labs    Chemistry Recent Labs  Lab 07/19/17 0713 07/20/17 0607 07/21/17 0513  NA 135 136 137  K 3.9 4.0 4.0  CL 99* 100* 99*  CO2 29 28 28   GLUCOSE 101* 116* 109*  BUN 35* 33* 31*  CREATININE 1.77* 1.64* 1.60*  CALCIUM 8.9 9.1 9.1  GFRNONAA 32* 35* 37*  GFRAA 37* 41* 42*  ANIONGAP 7 8 10      Hematology Recent Labs  Lab 07/21/17 0513  WBC 5.1  RBC 4.15*  HGB 12.6*  HCT 37.4*  MCV 90.1  MCH 30.4  MCHC 33.7  RDW 15.5  PLT 177    Cardiac EnzymesNo results for input(s): TROPONINI in the last 168 hours. No results for input(s): TROPIPOC in the last 168 hours.   BNP Recent Labs  Lab 07/20/17 0607  BNP 980.9*     DDimer No results for input(s): DDIMER in the last 168 hours.   Radiology    No results found.  Cardiac Studies   2D echo 06/13/17 Study Conclusions  - Left ventricle: The cavity size  was normal. Wall thickness was   increased in a pattern of mild LVH. Systolic function was mildly   to moderately reduced. The estimated ejection fraction was in the   range of 40% to 45%. Akinesis of the apical myocardium. - Ventricular septum: The contour showed systolic flattening. These   changes are consistent with RV volume and pressure overload. - Aortic valve: Trileaflet; normal thickness, mildly calcified   leaflets. Transvalvular velocity was increased. There was mild   stenosis. Valve area (VTI): 1.28 cm^2. Valve area (Vmax): 1.35   cm^2. Valve area (Vmean): 1.21 cm^2. - Aortic root: The aortic root was mildly dilated. - Mitral valve: Mildly calcified annulus. There was moderate to   severe regurgitation. - Left atrium: The atrium was severely dilated. - Right ventricle: The cavity size was dilated. Wall thickness was   normal. - Right atrium: The atrium was severely dilated. - Tricuspid valve:  There was moderate regurgitation. - Pulmonary arteries: Systolic pressure could not be accurately   estimated. Suspect it is higher because of other characteristics.   PA peak pressure: 33 mm Hg (S).  Patient Profile      84 y.o. male PMH of CAD s/p [id number], h/o ICM with improved EF, prior CVA, prior mural thrombus on chronic coumadin Rx, prostate CA, and h/o GI bleed.He had a cardiac catheterization in June 2013 that showed 100%ostLAD, distal LAD filled with RV branch off of RCA, PLA possibly occluded by most distal RCA appears to be filled from LIMA collateral, LIMA patent but does not supply distal LAD. Medical therapy was recommended. Last echocardiogram obtained on 11/13/2013 showed EF 50-55%, akinesis of the entire apical myocardium, grade 1 DD, PA peak pressure 56 mmHg. Ejection fraction has improved from the previous 40-45% in 2007. He is intolerant to all beta blockers.Myoview obtained on 11/02/2016 showed EF 46%, defect in the distal anterior, distal inferior, apical wall consistent with small regional scar, inferior thinning consistent with soft tissue attenuation. Otherwise no ischemia.  Pt presented from the AF clinic with weight gain and worsening SOB which had been ongoing since early 05/2017.admitted for atrial fibrillation and acute on chronic combined systolic and diastolic CHF.    Assessment & Plan    1. Persistent  Atrial Fibrillation: initial DCCV 05/2017 (initially successful but converted back to AF within a few days- echo with atrial enlargement). AAD therapy added, Amiodarone 400 mg BID. Repeat DCCV performed yesterday. A single synchronized shock of 150J was delivered resulting in restoration of sinus rhythm. Appears to currently be in NSR (very small p waves), but pt has felt occasional breakthrough afib. Rate is controlled. We will continue amiodarone and will wean down dose over the course of the next several weeks. He is on Eliquis for a/c, low dose 2.5 mg BID due to  age >34 and Scr >1.5.  2. Acute on Chronic Combined Systolic and Diastolic CHF: good diuresis with IV Lasix. I/Os net negative 7.3 L since admit. Weight is down from 186 lb to 174 lb.  Breathing improved. He denies exertional dyspnea. He has faint left sided crackles at the bases. He got another dose of IV Lasix this morning. Will try to transition to PO in the next 24 hrs.   3. CAD: h/o CABG. He denies CP. Continue medical therapy.   4. Mitral Regurgitation: moderate to severe MR noted on echo. EF 40-45%. ? If candidate for mitral clip.   5. CKD, Stage II: SCr improving, down from 1.77>>1.64>>1.60. Nephrology following.   For  questions or updates, please contact [place] Please consult [email or web address] for contact info under Cardiology/STEMI.      Signed, Lyda Jester, PA-C  07/21/2017, 12:13 PM     Patient seen and examined. Agree with assessment and plan. ECG last night and this am shows sinus rhythm. Cr improving to 1.6.  BNP yesterday 980. Will transition from iv lasix to oral diuretic. Aim for dc tomorrow.I spoke with granddaughter who is a nurse who wishes volume status be addressed with vest prior to dc to make certain no significant residual overload prior to dc.   Troy Sine, MD, [facility] 07/21/2017 1:01 PM

## 2017-07-22 ENCOUNTER — Ambulatory Visit (HOSPITAL_COMMUNITY): Admit: 2017-07-22 | Payer: Medicare Other | Admitting: Cardiology

## 2017-07-22 ENCOUNTER — Encounter (HOSPITAL_COMMUNITY): Payer: Self-pay

## 2017-07-22 DIAGNOSIS — I5043 Acute on chronic combined systolic (congestive) and diastolic (congestive) heart failure: Secondary | ICD-10-CM

## 2017-07-22 LAB — BASIC METABOLIC PANEL
ANION GAP: 10 (ref 5–15)
BUN: 27 mg/dL — ABNORMAL HIGH (ref 6–20)
CO2: 27 mmol/L (ref 22–32)
Calcium: 8.9 mg/dL (ref 8.9–10.3)
Chloride: 99 mmol/L — ABNORMAL LOW (ref 101–111)
Creatinine, Ser: 1.45 mg/dL — ABNORMAL HIGH (ref 0.61–1.24)
GFR calc Af Amer: 48 mL/min — ABNORMAL LOW (ref >60)
GFR calc non Af Amer: 41 mL/min — ABNORMAL LOW (ref >60)
GLUCOSE: 98 mg/dL (ref 65–99)
Potassium: 3.7 mmol/L (ref 3.5–5.1)
Sodium: 136 mmol/L (ref 135–145)

## 2017-07-22 SURGERY — CARDIOVERSION
Anesthesia: General

## 2017-07-22 MED ORDER — RANOLAZINE ER 500 MG PO TB12: 500.0000 mg | ORAL_TABLET | Freq: Two times a day (BID) | ORAL | 5 refills | Status: DC

## 2017-07-22 MED ORDER — AMIODARONE HCL 200 MG PO TABS: 400.0000 mg | ORAL_TABLET | Freq: Two times a day (BID) | ORAL | 5 refills | Status: DC

## 2017-07-22 MED ORDER — APIXABAN 2.5 MG PO TABS: 2.5000 mg | ORAL_TABLET | Freq: Two times a day (BID) | ORAL | 5 refills | Status: DC

## 2017-07-22 MED ORDER — FUROSEMIDE 40 MG PO TABS: 40.0000 mg | ORAL_TABLET | Freq: Two times a day (BID) | ORAL | 5 refills | Status: DC

## 2017-07-22 NOTE — Progress Notes (Addendum)
Progress Note  Patient Name: Donald [name] Date of Encounter: 07/22/2017  Primary Cardiologist: Kirk Ruths, MD   Subjective   Feels good. No CP or dyspnea. No palpitations currently. RRR on exam. Ready to go home.   Inpatient Medications    Scheduled Meds: . amiodarone  400 mg Oral BID  . apixaban  2.5 mg Oral BID  . cholecalciferol  2,000 Units Oral Daily  . furosemide  40 mg Oral BID  . latanoprost  1 drop Left Eye QHS  . potassium chloride  20 mEq Oral Daily  . ranolazine  500 mg Oral BID  . sodium chloride flush  3 mL Intravenous Q12H  . sodium chloride flush  3 mL Intravenous Q12H  . sodium chloride flush  3 mL Intravenous Q12H  . tamsulosin  0.4 mg Oral QPM   Continuous Infusions: . sodium chloride    . sodium chloride 500 mL (07/15/17 0856)  . sodium chloride 500 mL (07/20/17 1154)   PRN Meds: sodium chloride, acetaminophen, albuterol, fluticasone furoate-vilanterol, hydrocortisone cream, sodium chloride flush, sodium chloride flush, sodium chloride flush   Vital Signs    Vitals:   07/21/17 1455 07/21/17 1914 07/22/17 0540 07/22/17 1050  BP: 98/66 110/73 92/72 117/78  Pulse: 67 68 66 69  Resp: 16 16 18    Temp: 97.8 F (36.6 C) 98.2 F (36.8 C) 97.9 F (36.6 C)   TempSrc: Oral Oral Oral   SpO2: 98% 95% 100%   Weight:   173 lb 6.4 oz (78.7 kg)   Height:        Intake/Output Summary (Last 24 hours) at 07/22/2017 1134 Last data filed at 07/22/2017 1018 Gross per 24 hour  Intake 960 ml  Output 2025 ml  Net -1065 ml   Filed Weights   07/20/17 0426 07/21/17 0607 07/22/17 0540  Weight: 178 lb 3.2 oz (80.8 kg) 174 lb 14.4 oz (79.3 kg) 173 lb 6.4 oz (78.7 kg)    Telemetry    NSR - Personally Reviewed  ECG    NSR, RBBB post cardioversion 07/21/17 - Personally Reviewed  Physical Exam   GEN: No acute distress.   Neck: No JVD Cardiac: RRR, no murmurs, rubs, or gallops.  Respiratory: Clear to auscultation bilaterally. GI: Soft, nontender,  non-distended  MS: No edema; No deformity. Neuro:  Nonfocal  Psych: Normal affect   Labs    Chemistry Recent Labs  Lab 07/20/17 0607 07/21/17 0513 07/22/17 0407  NA 136 137 136  K 4.0 4.0 3.7  CL 100* 99* 99*  CO2 28 28 27   GLUCOSE 116* 109* 98  BUN 33* 31* 27*  CREATININE 1.64* 1.60* 1.45*  CALCIUM 9.1 9.1 8.9  GFRNONAA 35* 37* 41*  GFRAA 41* 42* 48*  ANIONGAP 8 10 10      Hematology Recent Labs  Lab 07/21/17 0513  WBC 5.1  RBC 4.15*  HGB 12.6*  HCT 37.4*  MCV 90.1  MCH 30.4  MCHC 33.7  RDW 15.5  PLT 177    Cardiac EnzymesNo results for input(s): TROPONINI in the last 168 hours. No results for input(s): TROPIPOC in the last 168 hours.   BNP Recent Labs  Lab 07/20/17 0607  BNP 980.9*     DDimer No results for input(s): DDIMER in the last 168 hours.   Radiology    No results found.  Cardiac Studies   2D echo 06/13/17 Study Conclusions  - Left ventricle: The cavity size was normal. Wall thickness was increased in a pattern  of mild LVH. Systolic function was mildly to moderately reduced. The estimated ejection fraction was in the range of 40% to 45%. Akinesis of the apical myocardium. - Ventricular septum: The contour showed systolic flattening. These changes are consistent with RV volume and pressure overload. - Aortic valve: Trileaflet; normal thickness, mildly calcified leaflets. Transvalvular velocity was increased. There was mild stenosis. Valve area (VTI): 1.28 cm^2. Valve area (Vmax): 1.35 cm^2. Valve area (Vmean): 1.21 cm^2. - Aortic root: The aortic root was mildly dilated. - Mitral valve: Mildly calcified annulus. There was moderate to severe regurgitation. - Left atrium: The atrium was severely dilated. - Right ventricle: The cavity size was dilated. Wall thickness was normal. - Right atrium: The atrium was severely dilated. - Tricuspid valve: There was moderate regurgitation. - Pulmonary arteries: Systolic  pressure could not be accurately estimated. Suspect it is higher because of other characteristics. PA peak pressure: 33 mm Hg (S).   Patient Profile     84 y.o.malePMH of CAD s/p [id number], h/o ICM with improved EF, prior CVA, prior mural thrombus on chronic coumadin Rx, prostate CA, and h/o GI bleed.He had a cardiac catheterization in June 2013 that showed 100%ostLAD, distal LAD filled with RV branch off of RCA, PLA possibly occluded by most distal RCA appears to be filled from LIMA collateral, LIMA patent but does not supply distal LAD. Medical therapy was recommended. Last echocardiogram obtained on 11/13/2013 showed EF 50-55%, akinesis of the entire apical myocardium, grade 1 DD, PA peak pressure 56 mmHg. Ejection fraction has improved from the previous 40-45% in 2007. He is intolerant to all beta blockers.Myoview obtained on 11/02/2016 showed EF 46%, defect in the distal anterior, distal inferior, apical wall consistent with small regional scar, inferior thinning consistent with soft tissue attenuation. Otherwise no ischemia.  Pt presented from the AF clinic with weight gain and worsening SOB which had been ongoing since early 05/2017.admitted for atrial fibrillation and acute on chronic combined systolic and diastolic CHF.    Assessment & Plan    1. Persistent Atrial Fibrillation: initial DCCV 05/2017 (initially successful but converted back to AF within a few days- echo with atrial enlargement). AAD therapy added, Amiodarone 400 mg BID. Repeat DCCV performed 07/20/17 and successfully. Maintaining NSR on amiodarone. Will d/c home on 400 mg BID and will gradually titrate down to low dose (400 mg BID was started on 4/20). We will arrange f/u in the afib clinic. Continue Eliquis for a/c. He is on low dose, 2.5 mg BID, given age and renal function.   2. Acute on Chronic Combined Systolic and Diastolic CHF: good UOP yesterday with a total of 2.2 L out. Net I/Os since admit is -8.5L.  Lasix  changed from IV to PO last night. He is getting PO 40 mg BID. Renal function stable and continues to improve, w/ SCr down from 1.8>1.7>1.6> 1.4 today. We asked the AHF team to help check volume status with vest yesterday. Report was given that his measurements were good and that he was dry. We appreciate there assistance. Will plan to send home on 40 mg BID (prior to admit, he was taking 60 mg once daily). Nephrology is following and also agrees w/ this dose. Continue supplemental K. Will need repeat BMP in 1 week to recheck renal function and K. Low salt diet and compliance with daily weights will be important post discharge.   3. CAD: h/o CABG. No chest pain. Continue current regimen.   4. Mitral Regurgitation: echo this 05/2017 showed  moderate to severe MR. ? If candidate for mitral clip.   5. CKD, Stage II: SCr continues to improve, down from 1.7 to 1.4 today. He will need a f/u BMP in 7-10 days. Nephrology has recommended outpatient f/u with Dr. Hollie Salk in 1-2 weeks.   Dispo: home today and f/u in the atrial fibrillation clinic as well as general cardiology clinic for f/u for heart failure.   For questions or updates, please contact [place] Please consult [email or web address] for contact info under Cardiology/STEMI.      Signed, Lyda Jester, PA-C  07/22/2017, 11:34 AM     Patient seen and examined. Agree with assessment and plan.  Maintaining sinus rhythm.  Feels well.  Volume status stable.  We will plan discharge today on amiodarone 400 mg twice a day until next week when dose should be reduced to 400 mg daily.  Repeat laboratory will need to be checked next week.  If renal function stays improved and creatinine continues to be below 1.5 will need to titrate  Eliquis to 5 mg twice a day   Troy Sine, MD, [facility] 07/22/2017 3:34 PM

## 2017-07-22 NOTE — Care Management Important Message (Signed)
Important Message  Patient Details  Name: Donald [name] MRN: [id number] Date of Birth: Jun 09, 1926   Medicare Important Message Given:  Yes    Sloan Takagi P Nina Hoar 07/22/2017, 2:01 PM

## 2017-07-24 ENCOUNTER — Emergency Department (HOSPITAL_COMMUNITY): Payer: Medicare Other

## 2017-07-24 ENCOUNTER — Encounter (HOSPITAL_COMMUNITY): Payer: Self-pay | Admitting: Emergency Medicine

## 2017-07-24 ENCOUNTER — Observation Stay (HOSPITAL_COMMUNITY): Payer: Medicare Other

## 2017-07-24 ENCOUNTER — Inpatient Hospital Stay (HOSPITAL_COMMUNITY)
Admission: EM | Admit: 2017-07-24 | Discharge: 2017-07-28 | DRG: 082 | Disposition: A | Discharge status: Skilled Nursing Facility | Payer: Medicare Other | Attending: Internal Medicine | Admitting: Internal Medicine

## 2017-07-24 DIAGNOSIS — Z885 Allergy status to narcotic agent status: Secondary | ICD-10-CM

## 2017-07-24 DIAGNOSIS — H409 Unspecified glaucoma: Secondary | ICD-10-CM | POA: Diagnosis present

## 2017-07-24 DIAGNOSIS — I4892 Unspecified atrial flutter: Secondary | ICD-10-CM | POA: Diagnosis not present

## 2017-07-24 DIAGNOSIS — S065X9A Traumatic subdural hemorrhage with loss of consciousness of unspecified duration, initial encounter: Secondary | ICD-10-CM | POA: Diagnosis not present

## 2017-07-24 DIAGNOSIS — I5041 Acute combined systolic (congestive) and diastolic (congestive) heart failure: Secondary | ICD-10-CM | POA: Diagnosis not present

## 2017-07-24 DIAGNOSIS — S065X0A Traumatic subdural hemorrhage without loss of consciousness, initial encounter: Secondary | ICD-10-CM | POA: Diagnosis not present

## 2017-07-24 DIAGNOSIS — Z800 Family history of malignant neoplasm of digestive organs: Secondary | ICD-10-CM

## 2017-07-24 DIAGNOSIS — I5022 Chronic systolic (congestive) heart failure: Secondary | ICD-10-CM | POA: Diagnosis present

## 2017-07-24 DIAGNOSIS — S0990XA Unspecified injury of head, initial encounter: Secondary | ICD-10-CM | POA: Diagnosis not present

## 2017-07-24 DIAGNOSIS — Z8673 Personal history of transient ischemic attack (TIA), and cerebral infarction without residual deficits: Secondary | ICD-10-CM

## 2017-07-24 DIAGNOSIS — I2510 Atherosclerotic heart disease of native coronary artery without angina pectoris: Secondary | ICD-10-CM | POA: Diagnosis present

## 2017-07-24 DIAGNOSIS — N183 Chronic kidney disease, stage 3 (moderate): Secondary | ICD-10-CM | POA: Diagnosis present

## 2017-07-24 DIAGNOSIS — S064X0A Epidural hemorrhage without loss of consciousness, initial encounter: Secondary | ICD-10-CM | POA: Diagnosis not present

## 2017-07-24 DIAGNOSIS — E869 Volume depletion, unspecified: Secondary | ICD-10-CM | POA: Diagnosis present

## 2017-07-24 DIAGNOSIS — Z923 Personal history of irradiation: Secondary | ICD-10-CM

## 2017-07-24 DIAGNOSIS — Y92129 Unspecified place in nursing home as the place of occurrence of the external cause: Secondary | ICD-10-CM

## 2017-07-24 DIAGNOSIS — S79911A Unspecified injury of right hip, initial encounter: Secondary | ICD-10-CM | POA: Diagnosis not present

## 2017-07-24 DIAGNOSIS — R402362 Coma scale, best motor response, obeys commands, at arrival to emergency department: Secondary | ICD-10-CM | POA: Diagnosis not present

## 2017-07-24 DIAGNOSIS — S199XXA Unspecified injury of neck, initial encounter: Secondary | ICD-10-CM | POA: Diagnosis not present

## 2017-07-24 DIAGNOSIS — I481 Persistent atrial fibrillation: Secondary | ICD-10-CM | POA: Diagnosis present

## 2017-07-24 DIAGNOSIS — R55 Syncope and collapse: Secondary | ICD-10-CM | POA: Diagnosis not present

## 2017-07-24 DIAGNOSIS — I255 Ischemic cardiomyopathy: Secondary | ICD-10-CM | POA: Diagnosis present

## 2017-07-24 DIAGNOSIS — I480 Paroxysmal atrial fibrillation: Secondary | ICD-10-CM | POA: Diagnosis not present

## 2017-07-24 DIAGNOSIS — R402252 Coma scale, best verbal response, oriented, at arrival to emergency department: Secondary | ICD-10-CM | POA: Diagnosis present

## 2017-07-24 DIAGNOSIS — Z881 Allergy status to other antibiotic agents status: Secondary | ICD-10-CM

## 2017-07-24 DIAGNOSIS — Z8249 Family history of ischemic heart disease and other diseases of the circulatory system: Secondary | ICD-10-CM

## 2017-07-24 DIAGNOSIS — R42 Dizziness and giddiness: Secondary | ICD-10-CM | POA: Diagnosis not present

## 2017-07-24 DIAGNOSIS — I509 Heart failure, unspecified: Secondary | ICD-10-CM | POA: Diagnosis not present

## 2017-07-24 DIAGNOSIS — T148XXA Other injury of unspecified body region, initial encounter: Secondary | ICD-10-CM

## 2017-07-24 DIAGNOSIS — I951 Orthostatic hypotension: Secondary | ICD-10-CM | POA: Diagnosis present

## 2017-07-24 DIAGNOSIS — Z951 Presence of aortocoronary bypass graft: Secondary | ICD-10-CM

## 2017-07-24 DIAGNOSIS — R402142 Coma scale, eyes open, spontaneous, at arrival to emergency department: Secondary | ICD-10-CM | POA: Diagnosis present

## 2017-07-24 DIAGNOSIS — S51811A Laceration without foreign body of right forearm, initial encounter: Secondary | ICD-10-CM | POA: Diagnosis not present

## 2017-07-24 DIAGNOSIS — E860 Dehydration: Secondary | ICD-10-CM | POA: Diagnosis present

## 2017-07-24 DIAGNOSIS — E785 Hyperlipidemia, unspecified: Secondary | ICD-10-CM | POA: Diagnosis present

## 2017-07-24 DIAGNOSIS — S098XXA Other specified injuries of head, initial encounter: Secondary | ICD-10-CM | POA: Diagnosis not present

## 2017-07-24 DIAGNOSIS — N400 Enlarged prostate without lower urinary tract symptoms: Secondary | ICD-10-CM | POA: Diagnosis present

## 2017-07-24 DIAGNOSIS — S0093XA Contusion of unspecified part of head, initial encounter: Secondary | ICD-10-CM | POA: Diagnosis not present

## 2017-07-24 DIAGNOSIS — Z79899 Other long term (current) drug therapy: Secondary | ICD-10-CM

## 2017-07-24 DIAGNOSIS — W1830XA Fall on same level, unspecified, initial encounter: Secondary | ICD-10-CM | POA: Diagnosis present

## 2017-07-24 DIAGNOSIS — S065XAA Traumatic subdural hemorrhage with loss of consciousness status unknown, initial encounter: Secondary | ICD-10-CM | POA: Diagnosis present

## 2017-07-24 DIAGNOSIS — Z66 Do not resuscitate: Secondary | ICD-10-CM | POA: Diagnosis not present

## 2017-07-24 DIAGNOSIS — Z7901 Long term (current) use of anticoagulants: Secondary | ICD-10-CM

## 2017-07-24 DIAGNOSIS — J449 Chronic obstructive pulmonary disease, unspecified: Secondary | ICD-10-CM | POA: Diagnosis present

## 2017-07-24 DIAGNOSIS — Z8546 Personal history of malignant neoplasm of prostate: Secondary | ICD-10-CM

## 2017-07-24 DIAGNOSIS — Z87442 Personal history of urinary calculi: Secondary | ICD-10-CM

## 2017-07-24 DIAGNOSIS — S7001XA Contusion of right hip, initial encounter: Secondary | ICD-10-CM

## 2017-07-24 HISTORY — DX: Unspecified atrial fibrillation: I48.91

## 2017-07-24 MED ORDER — FENTANYL CITRATE (PF) 100 MCG/2ML IJ SOLN
12.5000 ug | Freq: Once | INTRAMUSCULAR | Status: AC
  Administered 2017-07-24: 12.5 ug via INTRAVENOUS
  Filled 2017-07-24: qty 2

## 2017-07-24 MED ORDER — SODIUM CHLORIDE 0.9 % IV SOLN
INTRAVENOUS | Status: DC
  Administered 2017-07-25: 01:00:00 via INTRAVENOUS

## 2017-07-24 MED ORDER — FENTANYL CITRATE (PF) 100 MCG/2ML IJ SOLN
25.0000 ug | Freq: Once | INTRAMUSCULAR | Status: AC
  Administered 2017-07-25: 25 ug via INTRAVENOUS
  Filled 2017-07-24: qty 2

## 2017-07-24 MED ORDER — ACETAMINOPHEN 160 MG/5ML PO SOLN: 650.0000 mg | ORAL | Status: DC | PRN

## 2017-07-24 MED ORDER — FUROSEMIDE 40 MG PO TABS
40.0000 mg | ORAL_TABLET | Freq: Two times a day (BID) | ORAL | Status: DC
  Administered 2017-07-25: 40 mg via ORAL
  Filled 2017-07-24: qty 2

## 2017-07-24 MED ORDER — LORATADINE 10 MG PO TABS
10.0000 mg | ORAL_TABLET | Freq: Every day | ORAL | Status: DC
  Administered 2017-07-25 – 2017-07-28 (×4): 10 mg via ORAL
  Filled 2017-07-24 (×4): qty 1

## 2017-07-24 MED ORDER — HYDROCODONE-ACETAMINOPHEN 5-325 MG PO TABS
1.0000 | ORAL_TABLET | Freq: Four times a day (QID) | ORAL | Status: DC | PRN
  Administered 2017-07-25: 1 via ORAL
  Filled 2017-07-24 (×2): qty 1

## 2017-07-24 MED ORDER — FENTANYL CITRATE (PF) 100 MCG/2ML IJ SOLN: 25.0000 ug | INTRAMUSCULAR | Status: DC | PRN

## 2017-07-24 MED ORDER — IPRATROPIUM-ALBUTEROL 0.5-2.5 (3) MG/3ML IN SOLN: 3.0000 mL | RESPIRATORY_TRACT | Status: DC | PRN

## 2017-07-24 MED ORDER — ACETAMINOPHEN 650 MG RE SUPP: 650.0000 mg | RECTAL | Status: DC | PRN

## 2017-07-24 MED ORDER — LATANOPROST 0.005 % OP SOLN
1.0000 [drp] | Freq: Every day | OPHTHALMIC | Status: DC
  Administered 2017-07-25 – 2017-07-27 (×2): 1 [drp] via OPHTHALMIC
  Filled 2017-07-24 (×2): qty 2.5

## 2017-07-24 MED ORDER — PRAVASTATIN SODIUM 40 MG PO TABS
40.0000 mg | ORAL_TABLET | Freq: Every day | ORAL | Status: DC
  Administered 2017-07-25 – 2017-07-27 (×4): 40 mg via ORAL
  Filled 2017-07-24 (×4): qty 1

## 2017-07-24 MED ORDER — STROKE: EARLY STAGES OF RECOVERY BOOK
Freq: Once | Status: DC
  Filled 2017-07-24: qty 1

## 2017-07-24 MED ORDER — SENNOSIDES-DOCUSATE SODIUM 8.6-50 MG PO TABS
1.0000 | ORAL_TABLET | Freq: Every evening | ORAL | Status: DC | PRN
Start: 2017-07-24 — End: 2017-07-28
  Administered 2017-07-27 – 2017-07-28 (×2): 1 via ORAL
  Filled 2017-07-24 (×2): qty 1

## 2017-07-24 MED ORDER — ACETAMINOPHEN 325 MG PO TABS: 650.0000 mg | ORAL_TABLET | ORAL | Status: DC | PRN

## 2017-07-24 MED ORDER — AMIODARONE HCL 200 MG PO TABS
400.0000 mg | ORAL_TABLET | Freq: Two times a day (BID) | ORAL | Status: DC
  Administered 2017-07-25 – 2017-07-27 (×6): 400 mg via ORAL
  Filled 2017-07-24 (×6): qty 2

## 2017-07-24 MED ORDER — FLUTICASONE FUROATE-VILANTEROL 100-25 MCG/INH IN AEPB: 1.0000 | INHALATION_SPRAY | Freq: Every day | RESPIRATORY_TRACT | Status: DC | PRN

## 2017-07-24 MED ORDER — TAMSULOSIN HCL 0.4 MG PO CAPS
0.4000 mg | ORAL_CAPSULE | Freq: Every evening | ORAL | Status: DC
  Administered 2017-07-25 – 2017-07-27 (×4): 0.4 mg via ORAL
  Filled 2017-07-24 (×5): qty 1

## 2017-07-24 NOTE — H&P (Addendum)
History and Physical    Donald [name] BDZ:[id number] DOB: 03/13/1927 DOA: 07/24/2017  Referring MD/NP/PA: Dr. Fredia Sorrow PCP: Osborne Casco, [name], MD  Patient coming from: Home via EMS  Chief Complaint: Fall  I have personally briefly reviewed patient's old medical records in [place]   HPI: Donald [name] is a 84 y.o. male with medical history significant of  Afib, CAD, CHF last EF 40-45%, CVA, asthma, and h/o prostate cancer; who presents after having a fall.  Patient had just been hospitalized from 4/16 through 4/25 for persistent atrial fibrillation, combined CHF, and AKI.  During that hospitalization patient underwent DCCV on 4/18 and 4/23 now in sinus rhythm.  Medication changes included discontinuation of bisoprolol due to low blood pressures, increase in amiodarone from 200 to 400 mg per day, and increase in Lasix from 40 mg daily to 40 mg twice daily.  Patient reports feeling lightheaded with standing.  Today he had just gotten up and was walking the nursing facility where he was visiting friends when he felt lightheaded.  He tried holding onto the object around him, but subsequently was noted to lose consciousness. Patient landed striking the right side of his forehead and right hip.  He feels like he woke up within seconds of losing passing.  He complainedof pain is where he hit the floor and his right hip with movement.  He is on Eliquis has been taking this as recommended.  Denies having any significant chest pain, palpitations, lower extremity swelling, fever, chills, nausea, vomiting, diarrhea, dysuria, or change in weight.  Patient reports being up-to-date on his tetanus shot.  ED Course: Upon admission to the emergency department patient was noted to have pulse 79-83, respiration 12-18, blood pressure 108/72-142/93, and O2 saturation 92 to 96% on room air.  Initially only a BMP was ordered significant for BUN 27 and creatinine 1.45.  CT imaging of the brain showed an  acute small 4 mm subdural hematoma. X-rays of right hip were negative for acute fracture.  Dr. Arnoldo Morale of neurosurgery was consulted who recommended close monitoring overnight with repeat CT w/o stress the brain for any acute changes overnight.  Reversal of anticoagulation was not recommended at this time.    Review of Systems  Constitutional: Negative for chills, fever and weight loss.  HENT: Negative for congestion and ear pain.   Eyes: Negative for photophobia and pain.  Respiratory: Negative for cough and shortness of breath.   Cardiovascular: Negative for chest pain and palpitations.  Gastrointestinal: Negative for abdominal pain, nausea and vomiting.  Genitourinary: Negative for dysuria and hematuria.  Musculoskeletal: Positive for falls and joint pain.  Skin: Negative for itching and rash.  Neurological: Positive for dizziness, loss of consciousness and headaches.  Endo/Heme/Allergies: Negative for polydipsia. Bruises/bleeds easily.  Psychiatric/Behavioral: Negative for substance abuse and suicidal ideas.    Past Medical History:  Diagnosis Date  . Asthma    with worsening with beta blockade  . CAD (coronary artery disease)   . CVA (cerebral infarction) 2012   tia, mild no residual defecits  . GI bleed 8 yrs ago   due to doll fully vessel which was clipped  . Glaucoma    excellent cataracts  . History of kidney stones   . Ischemic cardiomyopathy   . Moderate to severe mitral regurgitation 06/16/2017  . Nephrolithiasis   . Post-infarction apical thrombus ([place])   . Prostate cancer ([place]) 2012  . Radiation proctitis Feb 2015   treated with APC  .  Tubular adenoma 04/2013   Dr. Hilarie Fredrickson    Past Surgical History:  Procedure Laterality Date  . cardiac stents  2003  . CARDIOVERSION N/A 06/15/2017   Procedure: CARDIOVERSION;  Surgeon: Acie Fredrickson, Wonda Cheng, MD;  Location: [facility];  Service: Cardiovascular;  Laterality: N/A;  . CARDIOVERSION N/A 07/15/2017   Procedure:  CARDIOVERSION;  Surgeon: Jerline Pain, MD;  Location: [facility] ENDOSCOPY;  Service: Cardiovascular;  Laterality: N/A;  . CARDIOVERSION N/A 07/20/2017   Procedure: CARDIOVERSION;  Surgeon: Herminio Commons, MD;  Location[facility] ENDOSCOPY;  Service: Cardiovascular;  Laterality: N/A;  . COLONOSCOPY WITH PROPOFOL N/A 05/05/2013   Procedure: COLONOSCOPY WITH PROPOFOL;  Surgeon: Jerene Bears, MD;  Location: [facility] ENDOSCOPY;  Service: Gastroenterology;  Laterality: N/A;  . CORONARY ARTERY BYPASS GRAFT  1998   LIMA to the LAD and saphenous vein graft tothe diagonal  . EXTRACORPOREAL SHOCK WAVE LITHOTRIPSY Right 04/29/2017   Procedure: RIGHT EXTRACORPOREAL SHOCK WAVE LITHOTRIPSY (ESWL);  Surgeon: Alexis Frock, MD;  Location: [facility] ORS;  Service: Urology;  Laterality: Right;  . history of radiation treatment  2012   x 40 treatments done  . KNEE ARTHROSCOPY  2016 or 2017   Dr Gladstone Lighter ;   . PARTIAL KNEE ARTHROPLASTY Right 01/13/2017   Procedure: UNICOMPARTMENTAL RIGHT KNEE;  Surgeon: Gaynelle Arabian, MD;  Location: [facility] ORS;  Service: Orthopedics;  Laterality: Right;  with block     reports that he has never smoked. He has never used smokeless tobacco. He reports that he drinks alcohol. He reports that he does not use drugs.  Allergies  Allergen Reactions  . Keflex [Cephalexin] Nausea And Vomiting and Other (See Comments)    'Killed all GI enzymes" (also) and patient prefers to not take this  . Morphine Nausea Only    Family History  Problem Relation Age of Onset  . Heart attack Father   . Heart disease Mother   . Stomach cancer Mother     Prior to Admission medications   Medication Sig Start Date End Date Taking? Authorizing Provider  amiodarone (PACERONE) 200 MG tablet Take 2 tablets (400 mg total) by mouth 2 (two) times daily. 07/22/17  Yes Lyda Jester M, PA-C  apixaban (ELIQUIS) 2.5 MG TABS tablet Take 1 tablet (2.5 mg total) by mouth 2 (two) times daily. 07/22/17  Yes Lyda Jester M, PA-C    Cholecalciferol (VITAMIN D) 2000 UNITS tablet Take 2,000 Units by mouth daily.   Yes [provider]  fenofibrate micronized (LOFIBRA) 200 MG capsule Take 1 capsule (200 mg total) by mouth daily. 05/25/17  Yes Lelon Perla, MD  Fluticasone Furoate-Vilanterol (BREO ELLIPTA) 100-25 MCG/INH AEPB Inhale 1 puff into the lungs daily as needed (shortness of breath).    Yes [provider]  furosemide (LASIX) 40 MG tablet Take 1 tablet (40 mg total) by mouth 2 (two) times daily. 07/22/17  Yes Lyda Jester M, PA-C  hydrocortisone cream 1 % Apply 1 application topically 3 (three) times daily as needed for itching (skin irritation). 06/16/17  Yes Eugenie Filler, MD  latanoprost (XALATAN) 0.005 % ophthalmic solution Place 1 drop into the left eye at bedtime.    Yes [provider]  loratadine (CLARITIN) 10 MG tablet Take 10 mg by mouth daily.   Yes [provider]  Multiple Vitamin (MULTIVITAMIN WITH MINERALS) TABS tablet Take 1 tablet by mouth daily.   Yes [provider]  potassium chloride 20 MEQ TBCR Take 20 mEq by mouth daily. 06/28/17  Yes [name], PA  pravastatin (PRAVACHOL) 40 MG tablet Take 1 tablet (40 mg total) by mouth at bedtime. 01/16/14  Yes Lelon Perla, MD  ranolazine (RANEXA) 500 MG 12 hr tablet Take 1 tablet (500 mg total) by mouth 2 (two) times daily. 07/22/17  Yes Lyda Jester M, PA-C  Tamsulosin HCl (FLOMAX) 0.4 MG CAPS Take 0.4 mg by mouth every evening.    Yes [provider]    Physical Exam:  Constitutional: Elderly male with large right-sided parietal hematoma present Vitals:   07/24/17 1930 07/24/17 2115 07/24/17 2200 07/24/17 2215  BP: 130/82 117/72 111/75 108/72  Pulse: 81 79 83 81  Resp: 12 15 17 13   SpO2: 95% 92% 95% 94%   Eyes: PERRL, lids and conjunctivae normal ENMT: Mucous membranes are moist. Posterior pharynx clear of any exudate or lesions.  Neck: normal, supple, no masses, no  thyromegaly Respiratory: clear to auscultation bilaterally, no wheezing, no crackles. Normal respiratory effort. No accessory muscle use.  Cardiovascular: Regular rate and rhythm, no murmurs / rubs / gallops. No extremity edema. 2+ pedal pulses. No carotid bruits.  Abdomen: no tenderness, no masses palpated. No hepatosplenomegaly. Bowel sounds positive.  Musculoskeletal: no clubbing / cyanosis.  Decreased range of motion of right hip due to pain.  Skin: no rashes, lesions, ulcers. No induration Neurologic: CN 2-12 grossly intact. Sensation intact, DTR normal. Strength 5/5 in all 4.  Psychiatric: Normal judgment and insight. Alert and oriented x 3. Normal mood.     Labs on Admission: I have personally reviewed following labs and imaging studies  CBC: Recent Labs  Lab 07/21/17 0513  WBC 5.1  HGB 12.6*  HCT 37.4*  MCV 90.1  PLT 425   Basic Metabolic Panel: Recent Labs  Lab 07/18/17 0827 07/19/17 0713 07/20/17 0607 07/21/17 0513 07/22/17 0407  NA 137 135 136 137 136  K 3.4* 3.9 4.0 4.0 3.7  CL 95* 99* 100* 99* 99*  CO2 31 29 28 28 27   GLUCOSE 95 101* 116* 109* 98  BUN 37* 35* 33* 31* 27*  CREATININE 1.84* 1.77* 1.64* 1.60* 1.45*  CALCIUM 9.1 8.9 9.1 9.1 8.9  MG  --   --  1.7 2.1  --    GFR: Estimated Creatinine Clearance: 34.5 mL/min (A) (by C-G formula based on SCr of 1.45 mg/dL (H)). Liver Function Tests: No results for input(s): AST, ALT, ALKPHOS, BILITOT, PROT, ALBUMIN in the last 168 hours. No results for input(s): LIPASE, AMYLASE in the last 168 hours. No results for input(s): AMMONIA in the last 168 hours. Coagulation Profile: No results for input(s): INR, PROTIME in the last 168 hours. Cardiac Enzymes: No results for input(s): CKTOTAL, CKMB, CKMBINDEX, TROPONINI in the last 168 hours. BNP (last 3 results) No results for input(s): PROBNP in the last 8760 hours. HbA1C: No results for input(s): HGBA1C in the last 72 hours. CBG: No results for input(s): GLUCAP  in the last 168 hours. Lipid Profile: No results for input(s): CHOL, HDL, LDLCALC, TRIG, CHOLHDL, LDLDIRECT in the last 72 hours. Thyroid Function Tests: No results for input(s): TSH, T4TOTAL, FREET4, T3FREE, THYROIDAB in the last 72 hours. Anemia Panel: No results for input(s): VITAMINB12, FOLATE, FERRITIN, TIBC, IRON, RETICCTPCT in the last 72 hours. Urine analysis: No results found for: COLORURINE, APPEARANCEUR, LABSPEC, PHURINE, GLUCOSEU, HGBUR, BILIRUBINUR, KETONESUR, PROTEINUR, UROBILINOGEN, NITRITE, LEUKOCYTESUR Sepsis Labs: No results found for this or any previous visit (from the past 240 hour(s)).   Radiological Exams on Admission: Ct Head Wo  Contrast  Result Date: 07/24/2017 CLINICAL DATA:  Weakness and dizziness today. Struck right forehead to the floor with abrasions. Patient on Eliquis. Patient seen last week for atrial fibrillation with cardioversion. EXAM: CT HEAD WITHOUT CONTRAST CT CERVICAL SPINE WITHOUT CONTRAST TECHNIQUE: Multidetector CT imaging of the head and cervical spine was performed following the standard protocol without intravenous contrast. Multiplanar CT image reconstructions of the cervical spine were also generated. COMPARISON:  MRI brain 12/31/2005.  CT head 12/30/2005 FINDINGS: CT HEAD FINDINGS Brain: Diffuse cerebral atrophy. Mild ventricular dilatation consistent with central atrophy. Low-attenuation changes in the deep white matter consistent small vessel ischemia. Focal area of old encephalomalacia in the left parietal convexity. Small acute subdural hematoma on the right frontal region measuring about 4 mm depth. No mass-effect or midline shift. Gray-white matter junctions are distinct. Basal cisterns are not effaced. Vascular: Intracranial arterial vascular calcifications are present. Skull: The calvarium appears intact. No acute depressed skull fractures identified. Sinuses/Orbits: Paranasal sinuses and mastoid air cells are not opacified. There is some  sclerosis in the left mastoids. Other: Prominent subcutaneous scalp hematoma over the right anterior frontal region. CT CERVICAL SPINE FINDINGS Alignment: There is straightening of usual cervical lordosis without anterior subluxation, centered in area of degenerative change. This is likely degenerative. Muscle spasm or ligamentous injury can sometimes have this appearance and are not entirely excluded. Normal alignment of the facet joints. C1-2 articulation appears intact. Skull base and vertebrae: Skull base appears intact. No vertebral compression deformities. No focal bone lesion or bone destruction. Cystic change in the base of the odontoid process is likely a degenerative cyst. Soft tissues and spinal canal: No prevertebral soft tissue swelling. No paraspinal soft tissue mass or infiltration. Disc levels: Degenerative changes throughout the cervical spine with narrowed interspaces and endplate hypertrophic changes. Degenerative changes are most severe at C5-6 and C6-7 levels. Prominent disc osteophyte complex at C5-6 causes some encroachment upon the anterior thecal sac centrally. Degenerative changes throughout the facet joints. Upper chest: Lung apices are clear. Vascular calcifications. Small amount of venous gas likely results from intravenous injections. Other: None. IMPRESSION: 1. Small acute subdural hematoma in the right frontal region measuring about 4 mm depth. No mass-effect or midline shift. 2. Chronic cerebral atrophy and small vessel ischemic changes. 3. Straightening of the usual cervical lordosis is likely degenerative. Diffuse degenerative change throughout the cervical spine. No acute displaced fractures identified. These results were called by telephone at the time of interpretation on 07/24/2017 at 9:28 pm to Dr. Fredia Sorrow , who verbally acknowledged these results. Electronically Signed   By: Lucienne Capers M.D.   On: 07/24/2017 21:29   Ct Cervical Spine Wo Contrast  Result Date:  07/24/2017 CLINICAL DATA:  Weakness and dizziness today. Struck right forehead to the floor with abrasions. Patient on Eliquis. Patient seen last week for atrial fibrillation with cardioversion. EXAM: CT HEAD WITHOUT CONTRAST CT CERVICAL SPINE WITHOUT CONTRAST TECHNIQUE: Multidetector CT imaging of the head and cervical spine was performed following the standard protocol without intravenous contrast. Multiplanar CT image reconstructions of the cervical spine were also generated. COMPARISON:  MRI brain 12/31/2005.  CT head 12/30/2005 FINDINGS: CT HEAD FINDINGS Brain: Diffuse cerebral atrophy. Mild ventricular dilatation consistent with central atrophy. Low-attenuation changes in the deep white matter consistent small vessel ischemia. Focal area of old encephalomalacia in the left parietal convexity. Small acute subdural hematoma on the right frontal region measuring about 4 mm depth. No mass-effect or midline shift. Gray-white matter junctions are distinct.  Basal cisterns are not effaced. Vascular: Intracranial arterial vascular calcifications are present. Skull: The calvarium appears intact. No acute depressed skull fractures identified. Sinuses/Orbits: Paranasal sinuses and mastoid air cells are not opacified. There is some sclerosis in the left mastoids. Other: Prominent subcutaneous scalp hematoma over the right anterior frontal region. CT CERVICAL SPINE FINDINGS Alignment: There is straightening of usual cervical lordosis without anterior subluxation, centered in area of degenerative change. This is likely degenerative. Muscle spasm or ligamentous injury can sometimes have this appearance and are not entirely excluded. Normal alignment of the facet joints. C1-2 articulation appears intact. Skull base and vertebrae: Skull base appears intact. No vertebral compression deformities. No focal bone lesion or bone destruction. Cystic change in the base of the odontoid process is likely a degenerative cyst. Soft tissues  and spinal canal: No prevertebral soft tissue swelling. No paraspinal soft tissue mass or infiltration. Disc levels: Degenerative changes throughout the cervical spine with narrowed interspaces and endplate hypertrophic changes. Degenerative changes are most severe at C5-6 and C6-7 levels. Prominent disc osteophyte complex at C5-6 causes some encroachment upon the anterior thecal sac centrally. Degenerative changes throughout the facet joints. Upper chest: Lung apices are clear. Vascular calcifications. Small amount of venous gas likely results from intravenous injections. Other: None. IMPRESSION: 1. Small acute subdural hematoma in the right frontal region measuring about 4 mm depth. No mass-effect or midline shift. 2. Chronic cerebral atrophy and small vessel ischemic changes. 3. Straightening of the usual cervical lordosis is likely degenerative. Diffuse degenerative change throughout the cervical spine. No acute displaced fractures identified. These results were called by telephone at the time of interpretation on 07/24/2017 at 9:28 pm to Dr. Fredia Sorrow , who verbally acknowledged these results. Electronically Signed   By: Lucienne Capers M.D.   On: 07/24/2017 21:29   Dg Hip Unilat  With Pelvis 2-3 Views Right  Result Date: 07/24/2017 CLINICAL DATA:  Per eMS: Patient seen and d/c'd last week for a-fib RVR with cardioversion. Patient taking 400mg /daily of amiodarone. Today patient felt weak and dizzy (several episodes since leaving the hospital), hit right forehead to floor (abrasion, patient on eliquis), c/p right hip pain with no deformity noted EXAM: DG HIP (WITH OR WITHOUT PELVIS) 2-3V RIGHT COMPARISON:  None. FINDINGS: No fracture.  No bone lesion. Hip joints are spaced and aligned. SI joints and symphysis pubis are normally spaced and aligned. There is a soft tissue contusion lateral to the greater trochanter of the right proximal femur. IMPRESSION: 1. No fracture or dislocation. Electronically  Signed   By: Lajean Manes M.D.   On: 07/24/2017 21:01    EKG: Independently reviewed.  Sinus or ectopic rhythm with QTC noted to be 37  Assessment/Plan Traumatic subdural hematoma secondary to fall: Acute.  Patient presents after having a syncopal spell and collapse trauma to the right side of his head.  CT scan showing 4 cm subdural hematoma.  Dr. Arnoldo Morale of neurosurgery consulted. - Admit to stepdown bed - Stroke order set initiated - Neurochecks - Strict bedrest - Systolic blood pressure goal of less than 160 - Continuous pulse oximetry with goal O2 saturations greater than 90% - Recheck CT scan of brain without contrast in a.m. or earlier if neuro status declines - Appreciate neurosurgery consultative services, will follow-up for further recommendation  Syncope and collapse: Acute.  Suspect  possible orthostatic hypotension as cause of symptoms possibly related with increase in medications. - Follow-up telemetry - Will need to discuss meds with cardiology in  a.m.  Right hip pain/hematoma of scalp/skin tears: Patient reports being up-to-date on tetanus shot - Symptomatic treatment  - Patient may need further imaging of right hip   Paroxysmal atrial fibrillation: CHA2DS2-VASc score =4 - Hold Eliquis due to acute cranial bleed - Continue amiodarone  Systolic congestive heart failure: Last EF noted to be 40 to 45% by echocardiogram do performed 06/13/2017.  Patient's dry weight is around 173 pounds. - Strict I&O's and daily weights - Continue furosemide  Asthma/COPD - Continue Breo   Hyperlipidemia - Continue pravastatin   Chronic kidney disease stage III: Stable - Continue to monitor  BPH -Continue Flomax DVT prophylaxis: SCDs Code Status:DNR Family Communication: none  Disposition Plan: TBD  Consults called: Neurosurgery  Admission status: observation  Norval Morton MD [facility] Pager [phone]   If 7PM-7AM, please contact  night-coverage [email or web address] Password [facility]  07/24/2017, 10:26 PM

## 2017-07-24 NOTE — ED Notes (Signed)
Patient transported to X-ray 

## 2017-07-24 NOTE — ED Notes (Signed)
Pt informed of pain medication. Pt states he is ok for the moment. RN informed patient that at anytime if he felt like he needed pain meds to notify staff

## 2017-07-24 NOTE — ED Triage Notes (Signed)
Per eMS:  Patient seen and d/c'd last week for a-fib RVR with cardioversion.  Patient taking 400mg /daily of amiodarone.  Today patient felt weak and dizzy (several episodes since leaving the hospital), hit right forehead to floor (abrasion, patient on eliquis), c/p right hip pain with no deformity noted with EMS.  No LOC.  VSS

## 2017-07-24 NOTE — ED Provider Notes (Signed)
[facility] DEPARTMENT Provider Note   CSN: [id number] Arrival date & time: 07/24/17  1733     History   Chief Complaint Chief Complaint  Patient presents with  . Fall    HPI Donald [name] is a 84 y.o. male.  Patient brought in by EMS.  Patient known to me from earlier with his atrial fibrillation problems and his cardiomyopathy problems.  Patient was visiting someone at a nursing facility they had an unexplained syncopal episode and hit the floor hard.  Has a large hematoma to his right upper forehead area.  With some superficial abrasion and some slight bleeding.  Patient also with complaint of right hip pain.  Has some superficial skin tears to his arms and some bruising to his right forearm.  Denies any pain to the forearm.  Patient is mostly concerned about a hip fracture.  Currently awake and alert and back to his normal self.  Patient is on blood thinners.  Patient's tetanus is up-to-date he had one 2 years ago.     Past Medical History:  Diagnosis Date  . Asthma    with worsening with beta blockade  . CAD (coronary artery disease)   . CVA (cerebral infarction) 2012   tia, mild no residual defecits  . GI bleed 8 yrs ago   due to doll fully vessel which was clipped  . Glaucoma    excellent cataracts  . History of kidney stones   . Ischemic cardiomyopathy   . Moderate to severe mitral regurgitation 06/16/2017  . Nephrolithiasis   . 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 Prostate cancer ([place]) 2012  . Radiation proctitis Feb 2015   treated with APC  . Tubular adenoma 04/2013   Dr. Hilarie Fredrickson    Patient Active Problem List   Diagnosis Date Noted  . Traumatic subdural hematoma ([place]) 07/24/2017  . History of cardioversion 07/20/2017  . Persistent atrial fibrillation ([place])   . Acute on chronic systolic CHF (congestive heart failure) ([place]) 07/13/2017  . Moderate to severe mitral regurgitation 06/16/2017  . Acute combined systolic and  diastolic congestive heart failure ([place]) 06/16/2017  . New onset of congestive heart failure ([place]) 06/11/2017  . AKI (acute kidney injury) () 06/11/2017  . Normocytic normochromic anemia 06/11/2017  . OA (osteoarthritis) of knee 01/13/2017  . Encounter for therapeutic drug monitoring 05/10/2013  . Radiation proctitis 05/05/2013  . Colonic polyp 05/05/2013  . Chest pain 09/17/2011  . Paroxysmal atrial fibrillation ([place]) 05/19/2010  . MURAL THROMBUS, APEX OF HEART 09/18/2008  . HYPERLIPIDEMIA 09/14/2008  . CATARACTS 09/14/2008  . Coronary atherosclerosis 09/14/2008  . CARDIOMYOPATHY 09/14/2008  . CEREBROVASCULAR ACCIDENT 09/14/2008  . TRANSIENT ISCHEMIC ATTACK 09/14/2008  . ASTHMA 09/14/2008  . GUAIAC POSITIVE STOOL 09/14/2008    Past Surgical History:  Procedure Laterality Date  . cardiac stents  2003  . CARDIOVERSION N/A 06/15/2017   Procedure: CARDIOVERSION;  Surgeon: Acie Fredrickson, Wonda Cheng, MD;  Location: [facility];  Service: Cardiovascular;  Laterality: N/A;  . CARDIOVERSION N/A 07/15/2017   Procedure: CARDIOVERSION;  Surgeon: Jerline Pain, MD;  Location: [facility] ENDOSCOPY;  Service: Cardiovascular;  Laterality: N/A;  . CARDIOVERSION N/A 07/20/2017   Procedure: CARDIOVERSION;  Surgeon: Herminio Commons, MD;  Location: [facility] ENDOSCOPY;  Service: Cardiovascular;  Laterality: N/A;  . COLONOSCOPY WITH PROPOFOL N/A 05/05/2013   Procedure: COLONOSCOPY WITH PROPOFOL;  Surgeon: Jerene Bears, MD;  Location: [facility] ENDOSCOPY;  Service: Gastroenterology;  Laterality: N/A;  . CORONARY ARTERY BYPASS  GRAFT  1998   LIMA to the LAD and saphenous vein graft tothe diagonal  . EXTRACORPOREAL SHOCK WAVE LITHOTRIPSY Right 04/29/2017   Procedure: RIGHT EXTRACORPOREAL SHOCK WAVE LITHOTRIPSY (ESWL);  Surgeon: Alexis Frock, MD;  Location: [facility] ORS;  Service: Urology;  Laterality: Right;  . history of radiation treatment  2012   x 40 treatments done  . KNEE ARTHROSCOPY  2016 or 2017   Dr Gladstone Lighter ;   . PARTIAL  KNEE ARTHROPLASTY Right 01/13/2017   Procedure: UNICOMPARTMENTAL RIGHT KNEE;  Surgeon: Gaynelle Arabian, MD;  Location: [facility] ORS;  Service: Orthopedics;  Laterality: Right;  with block        Home Medications    Prior to Admission medications   Medication Sig Start Date End Date Taking? Authorizing Provider  amiodarone (PACERONE) 200 MG tablet Take 2 tablets (400 mg total) by mouth 2 (two) times daily. 07/22/17  Yes Lyda Jester M, PA-C  apixaban (ELIQUIS) 2.5 MG TABS tablet Take 1 tablet (2.5 mg total) by mouth 2 (two) times daily. 07/22/17  Yes Lyda Jester M, PA-C  Cholecalciferol (VITAMIN D) 2000 UNITS tablet Take 2,000 Units by mouth daily.   Yes [provider]  fenofibrate micronized (LOFIBRA) 200 MG capsule Take 1 capsule (200 mg total) by mouth daily. 05/25/17  Yes Lelon Perla, MD  Fluticasone Furoate-Vilanterol (BREO ELLIPTA) 100-25 MCG/INH AEPB Inhale 1 puff into the lungs daily as needed (shortness of breath).    Yes [provider]  furosemide (LASIX) 40 MG tablet Take 1 tablet (40 mg total) by mouth 2 (two) times daily. 07/22/17  Yes Lyda Jester M, PA-C  hydrocortisone cream 1 % Apply 1 application topically 3 (three) times daily as needed for itching (skin irritation). 06/16/17  Yes Eugenie Filler, MD  latanoprost (XALATAN) 0.005 % ophthalmic solution Place 1 drop into the left eye at bedtime.    Yes [provider]  loratadine (CLARITIN) 10 MG tablet Take 10 mg by mouth daily.   Yes [provider]  Multiple Vitamin (MULTIVITAMIN WITH MINERALS) TABS tablet Take 1 tablet by mouth daily.   Yes [provider]  potassium chloride 20 MEQ TBCR Take 20 mEq by mouth daily. 06/28/17  Yes [name], PA  pravastatin (PRAVACHOL) 40 MG tablet Take 1 tablet (40 mg total) by mouth at bedtime. 01/16/14  Yes Lelon Perla, MD  ranolazine (RANEXA) 500 MG 12 hr tablet Take 1 tablet (500 mg total) by mouth 2 (two) times daily.  07/22/17  Yes Lyda Jester M, PA-C  Tamsulosin HCl (FLOMAX) 0.4 MG CAPS Take 0.4 mg by mouth every evening.    Yes [provider]    Family History Family History  Problem Relation Age of Onset  . Heart attack Father   . Heart disease Mother   . Stomach cancer Mother     Social History Social History   Tobacco Use  . Smoking status: Never Smoker  . Smokeless tobacco: Never Used  Substance Use Topics  . Alcohol use: Yes    Alcohol/week: 0.0 oz    Comment: occasional beer or wine  . Drug use: No     Allergies   Keflex [cephalexin] and Morphine   Review of Systems Review of Systems  Constitutional: Negative for fever.  HENT: Negative for congestion.   Eyes: Negative for visual disturbance.  Respiratory: Negative for shortness of breath.   Cardiovascular: Negative for chest pain, palpitations and leg swelling.  Gastrointestinal: Negative for abdominal pain.  Musculoskeletal:  Positive for back pain.  Skin: Positive for wound.  Neurological: Positive for syncope.  Hematological: Bruises/bleeds easily.  Psychiatric/Behavioral: Negative for confusion.     Physical Exam Updated Vital Signs BP (!) 109/44   Pulse 84   Resp (!) 25   SpO2 92%   Physical Exam  Constitutional: He is oriented to person, place, and time. He appears well-developed and well-nourished. No distress.  HENT:  Mouth/Throat: Oropharynx is clear and moist.  Large hematoma to right upper forehead area measuring about 5 cm.  Some superficial abrasions no active bleeding no significant deep laceration.  Eyes: Pupils are equal, round, and reactive to light. Conjunctivae and EOM are normal.  Neck: Neck supple.  Cardiovascular: Normal rate, regular rhythm and normal heart sounds.  Pulmonary/Chest: Effort normal and breath sounds normal. No respiratory distress.  Abdominal: Soft. Bowel sounds are normal. There is no tenderness.  Musculoskeletal: Normal range of motion. He exhibits  tenderness. He exhibits no edema.  Tenderness to right hip area.  Good range of motion hip without significant pain some mild pain no deformity no leg shortening no obvious hip fracture on the right side.  Left side is completely normal.  Neurological: He is alert and oriented to person, place, and time. No cranial nerve deficit or sensory deficit. He exhibits normal muscle tone. Coordination normal.  Skin: Skin is warm.  Nursing note and vitals reviewed.    ED Treatments / Results  Labs (all labs ordered are listed, but only abnormal results are displayed) Labs Reviewed  CBC WITH DIFFERENTIAL/PLATELET    EKG None  Radiology Ct Head Wo Contrast  Result Date: 07/24/2017 CLINICAL DATA:  Weakness and dizziness today. Struck right forehead to the floor with abrasions. Patient on Eliquis. Patient seen last week for atrial fibrillation with cardioversion. EXAM: CT HEAD WITHOUT CONTRAST CT CERVICAL SPINE WITHOUT CONTRAST TECHNIQUE: Multidetector CT imaging of the head and cervical spine was performed following the standard protocol without intravenous contrast. Multiplanar CT image reconstructions of the cervical spine were also generated. COMPARISON:  MRI brain 12/31/2005.  CT head 12/30/2005 FINDINGS: CT HEAD FINDINGS Brain: Diffuse cerebral atrophy. Mild ventricular dilatation consistent with central atrophy. Low-attenuation changes in the deep white matter consistent small vessel ischemia. Focal area of old encephalomalacia in the left parietal convexity. Small acute subdural hematoma on the right frontal region measuring about 4 mm depth. No mass-effect or midline shift. Gray-white matter junctions are distinct. Basal cisterns are not effaced. Vascular: Intracranial arterial vascular calcifications are present. Skull: The calvarium appears intact. No acute depressed skull fractures identified. Sinuses/Orbits: Paranasal sinuses and mastoid air cells are not opacified. There is some sclerosis in the  left mastoids. Other: Prominent subcutaneous scalp hematoma over the right anterior frontal region. CT CERVICAL SPINE FINDINGS Alignment: There is straightening of usual cervical lordosis without anterior subluxation, centered in area of degenerative change. This is likely degenerative. Muscle spasm or ligamentous injury can sometimes have this appearance and are not entirely excluded. Normal alignment of the facet joints. C1-2 articulation appears intact. Skull base and vertebrae: Skull base appears intact. No vertebral compression deformities. No focal bone lesion or bone destruction. Cystic change in the base of the odontoid process is likely a degenerative cyst. Soft tissues and spinal canal: No prevertebral soft tissue swelling. No paraspinal soft tissue mass or infiltration. Disc levels: Degenerative changes throughout the cervical spine with narrowed interspaces and endplate hypertrophic changes. Degenerative changes are most severe at C5-6 and C6-7 levels. Prominent disc osteophyte complex at  C5-6 causes some encroachment upon the anterior thecal sac centrally. Degenerative changes throughout the facet joints. Upper chest: Lung apices are clear. Vascular calcifications. Small amount of venous gas likely results from intravenous injections. Other: None. IMPRESSION: 1. Small acute subdural hematoma in the right frontal region measuring about 4 mm depth. No mass-effect or midline shift. 2. Chronic cerebral atrophy and small vessel ischemic changes. 3. Straightening of the usual cervical lordosis is likely degenerative. Diffuse degenerative change throughout the cervical spine. No acute displaced fractures identified. These results were called by telephone at the time of interpretation on 07/24/2017 at 9:28 pm to Dr. Fredia Sorrow , who verbally acknowledged these results. Electronically Signed   By: Lucienne Capers M.D.   On: 07/24/2017 21:29   Ct Cervical Spine Wo Contrast  Result Date:  07/24/2017 CLINICAL DATA:  Weakness and dizziness today. Struck right forehead to the floor with abrasions. Patient on Eliquis. Patient seen last week for atrial fibrillation with cardioversion. EXAM: CT HEAD WITHOUT CONTRAST CT CERVICAL SPINE WITHOUT CONTRAST TECHNIQUE: Multidetector CT imaging of the head and cervical spine was performed following the standard protocol without intravenous contrast. Multiplanar CT image reconstructions of the cervical spine were also generated. COMPARISON:  MRI brain 12/31/2005.  CT head 12/30/2005 FINDINGS: CT HEAD FINDINGS Brain: Diffuse cerebral atrophy. Mild ventricular dilatation consistent with central atrophy. Low-attenuation changes in the deep white matter consistent small vessel ischemia. Focal area of old encephalomalacia in the left parietal convexity. Small acute subdural hematoma on the right frontal region measuring about 4 mm depth. No mass-effect or midline shift. Gray-white matter junctions are distinct. Basal cisterns are not effaced. Vascular: Intracranial arterial vascular calcifications are present. Skull: The calvarium appears intact. No acute depressed skull fractures identified. Sinuses/Orbits: Paranasal sinuses and mastoid air cells are not opacified. There is some sclerosis in the left mastoids. Other: Prominent subcutaneous scalp hematoma over the right anterior frontal region. CT CERVICAL SPINE FINDINGS Alignment: There is straightening of usual cervical lordosis without anterior subluxation, centered in area of degenerative change. This is likely degenerative. Muscle spasm or ligamentous injury can sometimes have this appearance and are not entirely excluded. Normal alignment of the facet joints. C1-2 articulation appears intact. Skull base and vertebrae: Skull base appears intact. No vertebral compression deformities. No focal bone lesion or bone destruction. Cystic change in the base of the odontoid process is likely a degenerative cyst. Soft tissues  and spinal canal: No prevertebral soft tissue swelling. No paraspinal soft tissue mass or infiltration. Disc levels: Degenerative changes throughout the cervical spine with narrowed interspaces and endplate hypertrophic changes. Degenerative changes are most severe at C5-6 and C6-7 levels. Prominent disc osteophyte complex at C5-6 causes some encroachment upon the anterior thecal sac centrally. Degenerative changes throughout the facet joints. Upper chest: Lung apices are clear. Vascular calcifications. Small amount of venous gas likely results from intravenous injections. Other: None. IMPRESSION: 1. Small acute subdural hematoma in the right frontal region measuring about 4 mm depth. No mass-effect or midline shift. 2. Chronic cerebral atrophy and small vessel ischemic changes. 3. Straightening of the usual cervical lordosis is likely degenerative. Diffuse degenerative change throughout the cervical spine. No acute displaced fractures identified. These results were called by telephone at the time of interpretation on 07/24/2017 at 9:28 pm to Dr. Fredia Sorrow , who verbally acknowledged these results. Electronically Signed   By: Lucienne Capers M.D.   On: 07/24/2017 21:29   Dg Hip Unilat  With Pelvis 2-3 Views Right  Result  Date: 07/24/2017 CLINICAL DATA:  Per eMS: Patient seen and d/c'd last week for a-fib RVR with cardioversion. Patient taking 400mg /daily of amiodarone. Today patient felt weak and dizzy (several episodes since leaving the hospital), hit right forehead to floor (abrasion, patient on eliquis), c/p right hip pain with no deformity noted EXAM: DG HIP (WITH OR WITHOUT PELVIS) 2-3V RIGHT COMPARISON:  None. FINDINGS: No fracture.  No bone lesion. Hip joints are spaced and aligned. SI joints and symphysis pubis are normally spaced and aligned. There is a soft tissue contusion lateral to the greater trochanter of the right proximal femur. IMPRESSION: 1. No fracture or dislocation. Electronically  Signed   By: Lajean Manes M.D.   On: 07/24/2017 21:01    Procedures Procedures (including critical care time)  CRITICAL CARE Performed by: Fredia Sorrow Total critical care time: 30 minutes Critical care time was exclusive of separately billable procedures and treating other patients. Critical care was necessary to treat or prevent imminent or life-threatening deterioration. Critical care was time spent personally by me on the following activities: development of treatment plan with patient and/or surrogate as well as nursing, discussions with consultants, evaluation of patient's response to treatment, examination of patient, obtaining history from patient or surrogate, ordering and performing treatments and interventions, ordering and review of laboratory studies, ordering and review of radiographic studies, pulse oximetry and re-evaluation of patient's condition.   Medications Ordered in ED Medications  0.9 %  sodium chloride infusion (has no administration in time range)  fentaNYL (SUBLIMAZE) injection 25 mcg (has no administration in time range)  amiodarone (PACERONE) tablet 400 mg (has no administration in time range)  latanoprost (XALATAN) 0.005 % ophthalmic solution 1 drop (has no administration in time range)  fluticasone furoate-vilanterol (BREO ELLIPTA) 100-25 MCG/INH 1 puff (has no administration in time range)  furosemide (LASIX) tablet 40 mg (has no administration in time range)  loratadine (CLARITIN) tablet 10 mg (has no administration in time range)  pravastatin (PRAVACHOL) tablet 40 mg (has no administration in time range)  tamsulosin (FLOMAX) capsule 0.4 mg (has no administration in time range)   stroke: mapping our early stages of recovery book (has no administration in time range)  acetaminophen (TYLENOL) tablet 650 mg (has no administration in time range)    Or  acetaminophen (TYLENOL) solution 650 mg (has no administration in time range)    Or  acetaminophen  (TYLENOL) suppository 650 mg (has no administration in time range)  senna-docusate (Senokot-S) tablet 1 tablet (has no administration in time range)  ipratropium-albuterol (DUONEB) 0.5-2.5 (3) MG/3ML nebulizer solution 3 mL (has no administration in time range)  HYDROcodone-acetaminophen (NORCO/VICODIN) 5-325 MG per tablet 1 tablet (has no administration in time range)  fentaNYL (SUBLIMAZE) injection 25 mcg (has no administration in time range)  fentaNYL (SUBLIMAZE) injection 12.5 mcg (12.5 mcg Intravenous Given 07/24/17 1946)  fentaNYL (SUBLIMAZE) injection 12.5 mcg (12.5 mcg Intravenous Given 07/24/17 2033)     Initial Impression / Assessment and Plan / ED Course  I have reviewed the triage vital signs and the nursing notes.  Pertinent labs & imaging results that were available during my care of the patient were reviewed by me and considered in my medical decision making (see chart for details).     Patient with syncope unexplained needs cardiac monitoring.  Injury to the head CT scan shows 4 mm subdural hematoma under the right forehead under area.  Discussed with Dr. Arnoldo Morale from neurosurgery.  Not recommending any reversal recommending CT scan repeat in  the morning.  Discussed with hospitalist who will admit.  Patient with some superficial skin tears and some bruising to the right hip.  No evidence of any pelvic or hip fracture.  Hospitalist will admit.  Patient will need to go to stepdown unit.  Final Clinical Impressions(s) / ED Diagnoses   Final diagnoses:  Syncope, unspecified syncope type  Injury of head, initial encounter  Subdural hematoma (HCC)  Multiple skin tears  Contusion of right hip, initial encounter    ED Discharge Orders    None       Fredia Sorrow, MD 07/24/17 2344

## 2017-07-24 NOTE — ED Notes (Signed)
Dr. Smith at bedside.

## 2017-07-25 ENCOUNTER — Observation Stay (HOSPITAL_COMMUNITY): Payer: Medicare Other

## 2017-07-25 ENCOUNTER — Encounter (HOSPITAL_COMMUNITY): Payer: Self-pay

## 2017-07-25 ENCOUNTER — Other Ambulatory Visit: Payer: Self-pay

## 2017-07-25 DIAGNOSIS — S065X9D Traumatic subdural hemorrhage with loss of consciousness of unspecified duration, subsequent encounter: Secondary | ICD-10-CM | POA: Diagnosis not present

## 2017-07-25 DIAGNOSIS — I5022 Chronic systolic (congestive) heart failure: Secondary | ICD-10-CM | POA: Diagnosis not present

## 2017-07-25 DIAGNOSIS — S065X9A Traumatic subdural hemorrhage with loss of consciousness of unspecified duration, initial encounter: Secondary | ICD-10-CM | POA: Diagnosis not present

## 2017-07-25 DIAGNOSIS — S7001XA Contusion of right hip, initial encounter: Secondary | ICD-10-CM | POA: Diagnosis not present

## 2017-07-25 DIAGNOSIS — R55 Syncope and collapse: Secondary | ICD-10-CM

## 2017-07-25 DIAGNOSIS — I480 Paroxysmal atrial fibrillation: Secondary | ICD-10-CM | POA: Diagnosis not present

## 2017-07-25 DIAGNOSIS — S065X0A Traumatic subdural hemorrhage without loss of consciousness, initial encounter: Secondary | ICD-10-CM | POA: Diagnosis not present

## 2017-07-25 LAB — BASIC METABOLIC PANEL
Anion gap: 11 (ref 5–15)
BUN: 24 mg/dL — AB (ref 6–20)
CO2: 26 mmol/L (ref 22–32)
Calcium: 9 mg/dL (ref 8.9–10.3)
Chloride: 98 mmol/L — ABNORMAL LOW (ref 101–111)
Creatinine, Ser: 1.21 mg/dL (ref 0.61–1.24)
GFR calc Af Amer: 59 mL/min — ABNORMAL LOW (ref >60)
GFR calc non Af Amer: 51 mL/min — ABNORMAL LOW (ref >60)
Glucose, Bld: 110 mg/dL — ABNORMAL HIGH (ref 65–99)
POTASSIUM: 3.9 mmol/L (ref 3.5–5.1)
Sodium: 135 mmol/L (ref 135–145)

## 2017-07-25 LAB — CBC WITH DIFFERENTIAL/PLATELET
Basophils Absolute: 0 10*3/uL (ref 0.0–0.1)
Basophils Relative: 0 %
EOS ABS: 0 10*3/uL (ref 0.0–0.7)
EOS PCT: 0 %
HCT: 40.3 % (ref 39.0–52.0)
Hemoglobin: 13.6 g/dL (ref 13.0–17.0)
LYMPHS ABS: 0.5 10*3/uL — AB (ref 0.7–4.0)
Lymphocytes Relative: 7 %
MCH: 30.4 pg (ref 26.0–34.0)
MCHC: 33.7 g/dL (ref 30.0–36.0)
MCV: 90 fL (ref 78.0–100.0)
Monocytes Absolute: 0.5 10*3/uL (ref 0.1–1.0)
Monocytes Relative: 7 %
Neutro Abs: 6.3 10*3/uL (ref 1.7–7.7)
Neutrophils Relative %: 86 %
PLATELETS: 194 10*3/uL (ref 150–400)
RBC: 4.48 MIL/uL (ref 4.22–5.81)
RDW: 15.5 % (ref 11.5–15.5)
WBC: 7.3 10*3/uL (ref 4.0–10.5)

## 2017-07-25 LAB — MRSA PCR SCREENING: MRSA BY PCR: NEGATIVE

## 2017-07-25 MED ORDER — OXYCODONE HCL 5 MG PO TABS
5.0000 mg | ORAL_TABLET | ORAL | Status: DC | PRN
  Administered 2017-07-25 (×2): 10 mg via ORAL
  Filled 2017-07-25 (×2): qty 2

## 2017-07-25 MED ORDER — FENTANYL CITRATE (PF) 100 MCG/2ML IJ SOLN
12.5000 ug | INTRAMUSCULAR | Status: DC | PRN
Start: 2017-07-25 — End: 2017-07-28

## 2017-07-25 MED ORDER — ACETAMINOPHEN 325 MG PO TABS: 650.0000 mg | ORAL_TABLET | ORAL | Status: DC | PRN

## 2017-07-25 MED ORDER — RANOLAZINE ER 500 MG PO TB12
500.0000 mg | ORAL_TABLET | Freq: Two times a day (BID) | ORAL | Status: DC
  Administered 2017-07-25 – 2017-07-28 (×7): 500 mg via ORAL
  Filled 2017-07-25 (×7): qty 1

## 2017-07-25 MED ORDER — FENOFIBRATE 160 MG PO TABS
160.0000 mg | ORAL_TABLET | Freq: Every day | ORAL | Status: DC
  Administered 2017-07-25 – 2017-07-28 (×4): 160 mg via ORAL
  Filled 2017-07-25 (×4): qty 1

## 2017-07-25 NOTE — ED Notes (Signed)
Spoke w/ Freda Munro in lab to inquire as to pt CBC that was collected and sent @ 0040 that wasn't showing in process. She sts order was canceled, however, there's an order for an add-on CBC, she sts she will run it.

## 2017-07-25 NOTE — Consult Note (Signed)
Reason for Consult: Subdural hematoma Referring Physician: Dr. Perlie Mayo is an 84 y.o. male.  HPI: The patient is an 84 year old white male with heart problems on Eliquis for A. fib who had a syncopal event yesterday.  He struck his right head and bruised his right hip and arm.  He was brought to the ER a CAT scan was obtained which demonstrated a small right subdural hematoma.  A repeat head CT demonstrates a stable/decreasing hematoma without midline shift.  A neurosurgical consultation was requested.  Past Medical History:  Diagnosis Date  . Asthma    with worsening with beta blockade  . CAD (coronary artery disease)   . CVA (cerebral infarction) 2012   tia, mild no residual defecits  . GI bleed 8 yrs ago   due to doll fully vessel which was clipped  . Glaucoma    excellent cataracts  . History of kidney stones   . Ischemic cardiomyopathy   . Moderate to severe mitral regurgitation 06/16/2017  . Nephrolithiasis   . 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 Prostate cancer ([place]) 2012  . Radiation proctitis Feb 2015   treated with APC  . Tubular adenoma 04/2013   Dr. Hilarie Fredrickson    Past Surgical History:  Procedure Laterality Date  . cardiac stents  2003  . CARDIOVERSION N/A 06/15/2017   Procedure: CARDIOVERSION;  Surgeon: Acie Fredrickson, Wonda Cheng, MD;  Location: [facility];  Service: Cardiovascular;  Laterality: N/A;  . CARDIOVERSION N/A 07/15/2017   Procedure: CARDIOVERSION;  Surgeon: Jerline Pain, MD;  Location: [facility] ENDOSCOPY;  Service: Cardiovascular;  Laterality: N/A;  . CARDIOVERSION N/A 07/20/2017   Procedure: CARDIOVERSION;  Surgeon: Herminio Commons, MD;  Location: [facility] ENDOSCOPY;  Service: Cardiovascular;  Laterality: N/A;  . COLONOSCOPY WITH PROPOFOL N/A 05/05/2013   Procedure: COLONOSCOPY WITH PROPOFOL;  Surgeon: Jerene Bears, MD;  Location: [facility] ENDOSCOPY;  Service: Gastroenterology;  Laterality: N/A;  . CORONARY ARTERY BYPASS GRAFT  1998   LIMA to the LAD and  saphenous vein graft tothe diagonal  . EXTRACORPOREAL SHOCK WAVE LITHOTRIPSY Right 04/29/2017   Procedure: RIGHT EXTRACORPOREAL SHOCK WAVE LITHOTRIPSY (ESWL);  Surgeon: Alexis Frock, MD;  Location: [facility] ORS;  Service: Urology;  Laterality: Right;  . history of radiation treatment  2012   x 40 treatments done  . KNEE ARTHROSCOPY  2016 or 2017   Dr Gladstone Lighter ;   . PARTIAL KNEE ARTHROPLASTY Right 01/13/2017   Procedure: UNICOMPARTMENTAL RIGHT KNEE;  Surgeon: Gaynelle Arabian, MD;  Location: [facility] ORS;  Service: Orthopedics;  Laterality: Right;  with block    Family History  Problem Relation Age of Onset  . Heart attack Father   . Heart disease Mother   . Stomach cancer Mother     Social History:  reports that he has never smoked. He has never used smokeless tobacco. He reports that he drinks alcohol. He reports that he does not use drugs.  Allergies:  Allergies  Allergen Reactions  . Keflex [Cephalexin] Nausea And Vomiting and Other (See Comments)    'Killed all GI enzymes" (also) and patient prefers to not take this  . Morphine Nausea Only    Medications:  I have reviewed the patient's current medications. Prior to Admission:  (Not in a hospital admission) Scheduled: .  stroke: mapping our early stages of recovery book   Does not apply Once  . amiodarone  400 mg Oral BID  . fenofibrate  160 mg Oral Daily  . furosemide  40 mg Oral BID  . latanoprost  1 drop Left Eye QHS  . loratadine  10 mg Oral Daily  . pravastatin  40 mg Oral QHS  . ranolazine  500 mg Oral BID  . tamsulosin  0.4 mg Oral QPM   Continuous: . sodium chloride 50 mL/hr at 07/25/17 0039   NAT:[id number] **OR** acetaminophen (TYLENOL) oral liquid 160 mg/5 mL **OR** acetaminophen, fentaNYL (SUBLIMAZE) injection, fluticasone furoate-vilanterol, HYDROcodone-acetaminophen, ipratropium-albuterol, senna-docusate Anti-infectives (From admission, onward)   None       Results for orders placed or performed during the  hospital encounter of 07/24/17 (from the past 48 hour(s))  CBC with Differential/Platelet     Status: Abnormal   Collection Time: 07/25/17 12:55 AM  Result Value Ref Range   WBC 7.3 4.0 - 10.5 K/uL   RBC 4.48 4.22 - 5.81 MIL/uL   Hemoglobin 13.6 13.0 - 17.0 g/dL   HCT 40.3 39.0 - 52.0 %   MCV 90.0 78.0 - 100.0 fL   MCH 30.4 26.0 - 34.0 pg   MCHC 33.7 30.0 - 36.0 g/dL   RDW 15.5 11.5 - 15.5 %   Platelets 194 150 - 400 K/uL   Neutrophils Relative % 86 %   Neutro Abs 6.3 1.7 - 7.7 K/uL   Lymphocytes Relative 7 %   Lymphs Abs 0.5 (L) 0.7 - 4.0 K/uL   Monocytes Relative 7 %   Monocytes Absolute 0.5 0.1 - 1.0 K/uL   Eosinophils Relative 0 %   Eosinophils Absolute 0.0 0.0 - 0.7 K/uL   Basophils Relative 0 %   Basophils Absolute 0.0 0.0 - 0.1 K/uL    Comment: Performed at [facility], [address], [place], [place] [address]  Basic metabolic panel     Status: Abnormal   Collection Time: 07/25/17  6:15 AM  Result Value Ref Range   Sodium 135 135 - 145 mmol/L   Potassium 3.9 3.5 - 5.1 mmol/L   Chloride 98 (L) 101 - 111 mmol/L   CO2 26 22 - 32 mmol/L   Glucose, Bld 110 (H) 65 - 99 mg/dL   BUN 24 (H) 6 - 20 mg/dL   Creatinine, Ser 1.21 0.61 - 1.24 mg/dL   Calcium 9.0 8.9 - 10.3 mg/dL   GFR calc non Af Amer 51 (L) >60 mL/min   GFR calc Af Amer 59 (L) >60 mL/min    Comment: (NOTE) The eGFR has been calculated using the CKD EPI equation. This calculation has not been validated in all clinical situations. eGFR's persistently <60 mL/min signify possible Chronic Kidney Disease.    Anion gap 11 5 - 15    Comment: Performed at [facility] [address] [place], [place] [address]    Ct Head Wo Contrast  Result Date: 07/25/2017 CLINICAL DATA:  Follow-up examination for subdural hemorrhage. EXAM: CT HEAD WITHOUT CONTRAST TECHNIQUE: Contiguous axial images were obtained from the base of the skull through the vertex without intravenous contrast. COMPARISON:  Prior CT from  07/24/2017. FINDINGS: Brain: Previously identified small right subdural hematoma is stable to slightly decreased in size from previous, measuring up to 4 mm in maximal thickness, similar to previous. No evidence for interval bleeding or expansion. No new acute intracranial hemorrhage. Stable atrophy with chronic small vessel ischemic disease. Remote left parietal infarct noted. No acute large vessel territory infarct. No mass lesion or midline shift. No hydrocephalus. Vascular: No hyperdense vessel. Calcified atherosclerosis at the skull base. Skull: Evolving right frontal scalp  contusion.  Calvarium intact. Sinuses/Orbits: No acute abnormality. Paranasal sinuses are largely clear. No mastoid effusion. Other: None. IMPRESSION: 1. Stable to slightly decreased size of small right subdural hematoma measuring up to 4 mm in maximal thickness. No significant mass effect. 2. No other new acute intracranial abnormality. 3. Evolving right frontal scalp contusion. Electronically Signed   By: Jeannine Boga M.D.   On: 07/25/2017 06:53   Ct Head Wo Contrast  Result Date: 07/24/2017 CLINICAL DATA:  Weakness and dizziness today. Struck right forehead to the floor with abrasions. Patient on Eliquis. Patient seen last week for atrial fibrillation with cardioversion. EXAM: CT HEAD WITHOUT CONTRAST CT CERVICAL SPINE WITHOUT CONTRAST TECHNIQUE: Multidetector CT imaging of the head and cervical spine was performed following the standard protocol without intravenous contrast. Multiplanar CT image reconstructions of the cervical spine were also generated. COMPARISON:  MRI brain 12/31/2005.  CT head 12/30/2005 FINDINGS: CT HEAD FINDINGS Brain: Diffuse cerebral atrophy. Mild ventricular dilatation consistent with central atrophy. Low-attenuation changes in the deep white matter consistent small vessel ischemia. Focal area of old encephalomalacia in the left parietal convexity. Small acute subdural hematoma on the right frontal  region measuring about 4 mm depth. No mass-effect or midline shift. Gray-white matter junctions are distinct. Basal cisterns are not effaced. Vascular: Intracranial arterial vascular calcifications are present. Skull: The calvarium appears intact. No acute depressed skull fractures identified. Sinuses/Orbits: Paranasal sinuses and mastoid air cells are not opacified. There is some sclerosis in the left mastoids. Other: Prominent subcutaneous scalp hematoma over the right anterior frontal region. CT CERVICAL SPINE FINDINGS Alignment: There is straightening of usual cervical lordosis without anterior subluxation, centered in area of degenerative change. This is likely degenerative. Muscle spasm or ligamentous injury can sometimes have this appearance and are not entirely excluded. Normal alignment of the facet joints. C1-2 articulation appears intact. Skull base and vertebrae: Skull base appears intact. No vertebral compression deformities. No focal bone lesion or bone destruction. Cystic change in the base of the odontoid process is likely a degenerative cyst. Soft tissues and spinal canal: No prevertebral soft tissue swelling. No paraspinal soft tissue mass or infiltration. Disc levels: Degenerative changes throughout the cervical spine with narrowed interspaces and endplate hypertrophic changes. Degenerative changes are most severe at C5-6 and C6-7 levels. Prominent disc osteophyte complex at C5-6 causes some encroachment upon the anterior thecal sac centrally. Degenerative changes throughout the facet joints. Upper chest: Lung apices are clear. Vascular calcifications. Small amount of venous gas likely results from intravenous injections. Other: None. IMPRESSION: 1. Small acute subdural hematoma in the right frontal region measuring about 4 mm depth. No mass-effect or midline shift. 2. Chronic cerebral atrophy and small vessel ischemic changes. 3. Straightening of the usual cervical lordosis is likely  degenerative. Diffuse degenerative change throughout the cervical spine. No acute displaced fractures identified. These results were called by telephone at the time of interpretation on 07/24/2017 at 9:28 pm to Dr. Fredia Sorrow , who verbally acknowledged these results. Electronically Signed   By: Lucienne Capers M.D.   On: 07/24/2017 21:29   Ct Cervical Spine Wo Contrast  Result Date: 07/24/2017 CLINICAL DATA:  Weakness and dizziness today. Struck right forehead to the floor with abrasions. Patient on Eliquis. Patient seen last week for atrial fibrillation with cardioversion. EXAM: CT HEAD WITHOUT CONTRAST CT CERVICAL SPINE WITHOUT CONTRAST TECHNIQUE: Multidetector CT imaging of the head and cervical spine was performed following the standard protocol without intravenous contrast. Multiplanar CT image reconstructions of the  cervical spine were also generated. COMPARISON:  MRI brain 12/31/2005.  CT head 12/30/2005 FINDINGS: CT HEAD FINDINGS Brain: Diffuse cerebral atrophy. Mild ventricular dilatation consistent with central atrophy. Low-attenuation changes in the deep white matter consistent small vessel ischemia. Focal area of old encephalomalacia in the left parietal convexity. Small acute subdural hematoma on the right frontal region measuring about 4 mm depth. No mass-effect or midline shift. Gray-white matter junctions are distinct. Basal cisterns are not effaced. Vascular: Intracranial arterial vascular calcifications are present. Skull: The calvarium appears intact. No acute depressed skull fractures identified. Sinuses/Orbits: Paranasal sinuses and mastoid air cells are not opacified. There is some sclerosis in the left mastoids. Other: Prominent subcutaneous scalp hematoma over the right anterior frontal region. CT CERVICAL SPINE FINDINGS Alignment: There is straightening of usual cervical lordosis without anterior subluxation, centered in area of degenerative change. This is likely degenerative.  Muscle spasm or ligamentous injury can sometimes have this appearance and are not entirely excluded. Normal alignment of the facet joints. C1-2 articulation appears intact. Skull base and vertebrae: Skull base appears intact. No vertebral compression deformities. No focal bone lesion or bone destruction. Cystic change in the base of the odontoid process is likely a degenerative cyst. Soft tissues and spinal canal: No prevertebral soft tissue swelling. No paraspinal soft tissue mass or infiltration. Disc levels: Degenerative changes throughout the cervical spine with narrowed interspaces and endplate hypertrophic changes. Degenerative changes are most severe at C5-6 and C6-7 levels. Prominent disc osteophyte complex at C5-6 causes some encroachment upon the anterior thecal sac centrally. Degenerative changes throughout the facet joints. Upper chest: Lung apices are clear. Vascular calcifications. Small amount of venous gas likely results from intravenous injections. Other: None. IMPRESSION: 1. Small acute subdural hematoma in the right frontal region measuring about 4 mm depth. No mass-effect or midline shift. 2. Chronic cerebral atrophy and small vessel ischemic changes. 3. Straightening of the usual cervical lordosis is likely degenerative. Diffuse degenerative change throughout the cervical spine. No acute displaced fractures identified. These results were called by telephone at the time of interpretation on 07/24/2017 at 9:28 pm to Dr. Fredia Sorrow , who verbally acknowledged these results. Electronically Signed   By: Lucienne Capers M.D.   On: 07/24/2017 21:29   Dg Hip Unilat  With Pelvis 2-3 Views Right  Result Date: 07/24/2017 CLINICAL DATA:  Per eMS: Patient seen and d/c'd last week for a-fib RVR with cardioversion. Patient taking 433m/daily of amiodarone. Today patient felt weak and dizzy (several episodes since leaving the hospital), hit right forehead to floor (abrasion, patient on eliquis), c/p  right hip pain with no deformity noted EXAM: DG HIP (WITH OR WITHOUT PELVIS) 2-3V RIGHT COMPARISON:  None. FINDINGS: No fracture.  No bone lesion. Hip joints are spaced and aligned. SI joints and symphysis pubis are normally spaced and aligned. There is a soft tissue contusion lateral to the greater trochanter of the right proximal femur. IMPRESSION: 1. No fracture or dislocation. Electronically Signed   By: DLajean ManesM.D.   On: 07/24/2017 21:01    ROS: As above Blood pressure 115/81, pulse 96, resp. rate 19, SpO2 95 %. Estimated body mass index is 25.61 kg/m as calculated from the following:   Height as of 07/13/17: 5' 9"  (1.753 m).   Weight as of 07/22/17: 78.7 kg (173 lb 6.4 oz).  Physical Exam  General: Alert and pleasant 84year old white male with some right forehead and arm bruising/swelling.  Is in no apparent distress.  HEENT: The  patient has right frontal swelling and ecchymosis.  His pupils are miotic and equal.  Strength and muscles are intact.  There is no signs of battle sign, raccoon eyes, hemotympanum, CSF otorrhea or rhinorrhea.  Neck: Supple with an age-appropriate normal range of motion.  There is no tenderness.  Thorax: Symmetric  Abdomen: Soft  Extremities: The patient has bruising on his right arm and right hip.  Back exam: Unremarkable  Neurologic exam: The patient is alert and oriented x3.  Glasgow Coma Scale 15.  Cranial nerves II through XII are examined bilaterally grossly normal.  Vision and hearing are grossly normal bilateral.  Motor strength is 5/5 in his bilateral biceps, triceps, handgrip, gastrocnemius and dorsiflexors.  Cerebellar function is intact to rapid alternating movements of the upper extremity bilaterally.  Sensory function is intact light touch sensation all tested dermatomes bilaterally.  I have reviewed the patient's head CT performed yesterday and today most gone hospital.  He has a small right subdural hematoma without mass-effect.  The  follow-up head CT is stable.  Assessment/Plan: Right subdural hematoma: The patient is doing well clinically.  He has had CTs are stable.  He can follow-up with me on a as needed basis.  Anticoagulation: I will leave this up to Dr. [name], his cardiologist, when to resume anticoagulation.   Ophelia Charter 07/25/2017, 8:17 AM

## 2017-07-25 NOTE — ED Notes (Signed)
Patient transported to CT 

## 2017-07-25 NOTE — Progress Notes (Signed)
Patient arrived to unit in NAD. VS stable and patient free from pain. Full assessment complete.

## 2017-07-25 NOTE — ED Notes (Signed)
ORDERED DIET TRAY FOR PT  

## 2017-07-25 NOTE — Progress Notes (Signed)
[facility] TEAM 1 - Stepdown/ICU TEAM  Donald [name]  NTI:[id number] DOB: October 10, 1926 DOA: 07/24/2017 PCP: Haywood Pao, MD    Brief Narrative:  84yo M w/ a hx of  Afib, CAD, CHF (EF 40-45%), CVA, asthma, and prostate cancer who presented after a fall.  Patient was hospitalized 4/16 > 4/25 for persistent atrial fibrillation, CHF, and AKI.  During that hospitalization patient underwent DCCV on 4/18 and 4/23.  Medication changes included discontinuation of bisoprolol due to low blood pressures, increase in amiodarone from 200 to 400 mg per day, and increase in Lasix from 40 mg daily to 40 mg BID.  Patient reported feeling lightheaded with standing.  The day of his admit he stood up, began walking, felt dizzy, then passed out, striking the right side of his forehead and right hip when landing.   In the ED BUN was 27 and creatinine 1.45.  CTbrain showed an acute 4 mm subdural hematoma. X-rays of right hip were negative for acute fracture.    Significant Events: 4/27 admit   Subjective: C/o ongoing pain in his R hip.  Remarkably no pain in head.  Denies cp, sob, n/v, or abdom pain.   Assessment & Plan:  Traumatic subdural hematoma secondary to fall Neurosurgery following - stable on f/u CT head today   Syncope and collapse Hx most c/w orthostatic hypotension in the setting of volume depletion - gently hydrating - holding diuretic for now   Right hip pain / hematoma of scalp / skin tears Symptomatic treatment - begin PT/OT  Paroxysmal atrial fibrillation CHA2DS2-VASc is 4 - holding anticoag - rate controlled - pt asks that his Cardiologist be involved in his care - I will consult them in the AM   Chronic Systolic CHF EF 86-76% by TTE 06/13/2017 - dry weight is around 173 pounds - hold diuretic as pt currently [facility] - will likely need weight based diuretic dosing instead of scheduled dosing   Filed Weights   07/25/17 1300  Weight: 78.4 kg (172 lb 13.5 oz)    Asthma/COPD Continue  Breo   Hyperlipidemia Continue pravastatin   AKI on Chronic kidney disease stage III crt improving w/ hydration - follow   Recent Labs  Lab 07/19/17 0713 07/20/17 0607 07/21/17 0513 07/22/17 0407 07/25/17 0615  CREATININE 1.77* 1.64* 1.60* 1.45* 1.21    BPH Continue Flomax  DVT prophylaxis: SCDs Code Status: DNR - NO CODE  Family Communication: no family present at time of exam  Disposition Plan: SDU  Consultants:  NS  Antimicrobials:  none  Objective: Blood pressure 112/70, pulse 84, resp. rate 18, height 5\' 9"  (1.753 m), weight 78.4 kg (172 lb 13.5 oz), SpO2 95 %.  Intake/Output Summary (Last 24 hours) at 07/25/2017 1539 Last data filed at 07/25/2017 0043 Gross per 24 hour  Intake -  Output 300 ml  Net -300 ml   Filed Weights   07/25/17 1300  Weight: 78.4 kg (172 lb 13.5 oz)    Examination: General: No acute respiratory distress at rest in bed  Lungs: Clear to auscultation bilaterally without wheezes or crackles Cardiovascular: Regular rate without murmur gallop or rub normal S1 and S2 Abdomen: Nontender, nondistended, soft, bowel sounds positive, no rebound, no ascites, no appreciable mass Extremities: No significant cyanosis, clubbing, or edema bilateral lower extremities  CBC: Recent Labs  Lab 07/21/17 0513 07/25/17 0055  WBC 5.1 7.3  NEUTROABS  --  6.3  HGB 12.6* 13.6  HCT 37.4* 40.3  MCV 90.1 90.0  PLT 177 462   Basic Metabolic Panel: Recent Labs  Lab 07/20/17 0607 07/21/17 0513 07/22/17 0407 07/25/17 0615  NA 136 137 136 135  K 4.0 4.0 3.7 3.9  CL 100* 99* 99* 98*  CO2 28 28 27 26   GLUCOSE 116* 109* 98 110*  BUN 33* 31* 27* 24*  CREATININE 1.64* 1.60* 1.45* 1.21  CALCIUM 9.1 9.1 8.9 9.0  MG 1.7 2.1  --   --    GFR: Estimated Creatinine Clearance: 41.4 mL/min (by C-G formula based on SCr of 1.21 mg/dL).  Liver Function Tests: No results for input(s): AST, ALT, ALKPHOS, BILITOT, PROT, ALBUMIN in the last 168 hours. No  results for input(s): LIPASE, AMYLASE in the last 168 hours. No results for input(s): AMMONIA in the last 168 hours.   HbA1C: Hgb A1c MFr Bld  Date/Time Value Ref Range Status  07/15/2017 04:24 AM 5.9 (H) 4.8 - 5.6 % Final    Comment:    (NOTE) Pre diabetes:          5.7%-6.4% Diabetes:              >6.4% Glycemic control for   <7.0% adults with diabetes      Recent Results (from the past 240 hour(s))  MRSA PCR Screening     Status: None   Collection Time: 07/25/17  1:20 PM  Result Value Ref Range Status   MRSA by PCR NEGATIVE NEGATIVE Final    Comment:        The GeneXpert MRSA Assay (FDA approved for NASAL specimens only), is one component of a comprehensive MRSA colonization surveillance program. It is not intended to diagnose MRSA infection nor to guide or monitor treatment for MRSA infections. Performed at [facility], [place] [address] [place], [place] [address]      Scheduled Meds: .  stroke: mapping our early stages of recovery book   Does not apply Once  . amiodarone  400 mg Oral BID  . fenofibrate  160 mg Oral Daily  . furosemide  40 mg Oral BID  . latanoprost  1 drop Left Eye QHS  . loratadine  10 mg Oral Daily  . pravastatin  40 mg Oral QHS  . ranolazine  500 mg Oral BID  . tamsulosin  0.4 mg Oral QPM     LOS: 0 days   Cherene Altes, MD [facility] Office  [phone] Pager - Text Page per Amion as per below:  On-Call/Text Page:      [email or web address]      password [email or web address]  If 7PM-7AM, please contact night-coverage [email or web address] Password [facility] 07/25/2017, 3:39 PM

## 2017-07-26 DIAGNOSIS — S065X9A Traumatic subdural hemorrhage with loss of consciousness of unspecified duration, initial encounter: Secondary | ICD-10-CM | POA: Diagnosis present

## 2017-07-26 DIAGNOSIS — I4891 Unspecified atrial fibrillation: Secondary | ICD-10-CM | POA: Diagnosis not present

## 2017-07-26 DIAGNOSIS — N183 Chronic kidney disease, stage 3 (moderate): Secondary | ICD-10-CM | POA: Diagnosis present

## 2017-07-26 DIAGNOSIS — Z66 Do not resuscitate: Secondary | ICD-10-CM | POA: Diagnosis present

## 2017-07-26 DIAGNOSIS — W1830XA Fall on same level, unspecified, initial encounter: Secondary | ICD-10-CM | POA: Diagnosis present

## 2017-07-26 DIAGNOSIS — T148XXD Other injury of unspecified body region, subsequent encounter: Secondary | ICD-10-CM | POA: Diagnosis not present

## 2017-07-26 DIAGNOSIS — E785 Hyperlipidemia, unspecified: Secondary | ICD-10-CM | POA: Diagnosis present

## 2017-07-26 DIAGNOSIS — Z111 Encounter for screening for respiratory tuberculosis: Secondary | ICD-10-CM | POA: Diagnosis not present

## 2017-07-26 DIAGNOSIS — Z9181 History of falling: Secondary | ICD-10-CM | POA: Diagnosis not present

## 2017-07-26 DIAGNOSIS — S7001XD Contusion of right hip, subsequent encounter: Secondary | ICD-10-CM | POA: Diagnosis not present

## 2017-07-26 DIAGNOSIS — Z951 Presence of aortocoronary bypass graft: Secondary | ICD-10-CM | POA: Diagnosis not present

## 2017-07-26 DIAGNOSIS — H409 Unspecified glaucoma: Secondary | ICD-10-CM | POA: Diagnosis present

## 2017-07-26 DIAGNOSIS — R52 Pain, unspecified: Secondary | ICD-10-CM | POA: Diagnosis not present

## 2017-07-26 DIAGNOSIS — S065X0D Traumatic subdural hemorrhage without loss of consciousness, subsequent encounter: Secondary | ICD-10-CM | POA: Diagnosis not present

## 2017-07-26 DIAGNOSIS — S065X0A Traumatic subdural hemorrhage without loss of consciousness, initial encounter: Secondary | ICD-10-CM | POA: Diagnosis not present

## 2017-07-26 DIAGNOSIS — Z8673 Personal history of transient ischemic attack (TIA), and cerebral infarction without residual deficits: Secondary | ICD-10-CM | POA: Diagnosis not present

## 2017-07-26 DIAGNOSIS — I509 Heart failure, unspecified: Secondary | ICD-10-CM | POA: Diagnosis not present

## 2017-07-26 DIAGNOSIS — Y92129 Unspecified place in nursing home as the place of occurrence of the external cause: Secondary | ICD-10-CM | POA: Diagnosis not present

## 2017-07-26 DIAGNOSIS — I2510 Atherosclerotic heart disease of native coronary artery without angina pectoris: Secondary | ICD-10-CM | POA: Diagnosis present

## 2017-07-26 DIAGNOSIS — R41841 Cognitive communication deficit: Secondary | ICD-10-CM | POA: Diagnosis not present

## 2017-07-26 DIAGNOSIS — N400 Enlarged prostate without lower urinary tract symptoms: Secondary | ICD-10-CM | POA: Diagnosis present

## 2017-07-26 DIAGNOSIS — E860 Dehydration: Secondary | ICD-10-CM | POA: Diagnosis present

## 2017-07-26 DIAGNOSIS — I481 Persistent atrial fibrillation: Secondary | ICD-10-CM | POA: Diagnosis present

## 2017-07-26 DIAGNOSIS — E869 Volume depletion, unspecified: Secondary | ICD-10-CM | POA: Diagnosis present

## 2017-07-26 DIAGNOSIS — R279 Unspecified lack of coordination: Secondary | ICD-10-CM | POA: Diagnosis not present

## 2017-07-26 DIAGNOSIS — I480 Paroxysmal atrial fibrillation: Secondary | ICD-10-CM | POA: Diagnosis not present

## 2017-07-26 DIAGNOSIS — K5900 Constipation, unspecified: Secondary | ICD-10-CM | POA: Diagnosis not present

## 2017-07-26 DIAGNOSIS — S7001XA Contusion of right hip, initial encounter: Secondary | ICD-10-CM

## 2017-07-26 DIAGNOSIS — L299 Pruritus, unspecified: Secondary | ICD-10-CM | POA: Diagnosis not present

## 2017-07-26 DIAGNOSIS — I4892 Unspecified atrial flutter: Secondary | ICD-10-CM | POA: Diagnosis present

## 2017-07-26 DIAGNOSIS — C679 Malignant neoplasm of bladder, unspecified: Secondary | ICD-10-CM | POA: Diagnosis not present

## 2017-07-26 DIAGNOSIS — R262 Difficulty in walking, not elsewhere classified: Secondary | ICD-10-CM | POA: Diagnosis not present

## 2017-07-26 DIAGNOSIS — R402252 Coma scale, best verbal response, oriented, at arrival to emergency department: Secondary | ICD-10-CM | POA: Diagnosis present

## 2017-07-26 DIAGNOSIS — J449 Chronic obstructive pulmonary disease, unspecified: Secondary | ICD-10-CM | POA: Diagnosis present

## 2017-07-26 DIAGNOSIS — I5041 Acute combined systolic (congestive) and diastolic (congestive) heart failure: Secondary | ICD-10-CM | POA: Diagnosis present

## 2017-07-26 DIAGNOSIS — S065X9D Traumatic subdural hemorrhage with loss of consciousness of unspecified duration, subsequent encounter: Secondary | ICD-10-CM | POA: Diagnosis not present

## 2017-07-26 DIAGNOSIS — R402362 Coma scale, best motor response, obeys commands, at arrival to emergency department: Secondary | ICD-10-CM | POA: Diagnosis present

## 2017-07-26 DIAGNOSIS — Z79899 Other long term (current) drug therapy: Secondary | ICD-10-CM | POA: Diagnosis not present

## 2017-07-26 DIAGNOSIS — D649 Anemia, unspecified: Secondary | ICD-10-CM | POA: Diagnosis not present

## 2017-07-26 DIAGNOSIS — I255 Ischemic cardiomyopathy: Secondary | ICD-10-CM | POA: Diagnosis present

## 2017-07-26 DIAGNOSIS — Z743 Need for continuous supervision: Secondary | ICD-10-CM | POA: Diagnosis not present

## 2017-07-26 DIAGNOSIS — I951 Orthostatic hypotension: Secondary | ICD-10-CM | POA: Diagnosis present

## 2017-07-26 DIAGNOSIS — I5022 Chronic systolic (congestive) heart failure: Secondary | ICD-10-CM | POA: Diagnosis not present

## 2017-07-26 DIAGNOSIS — S0990XD Unspecified injury of head, subsequent encounter: Secondary | ICD-10-CM | POA: Diagnosis not present

## 2017-07-26 DIAGNOSIS — R402142 Coma scale, eyes open, spontaneous, at arrival to emergency department: Secondary | ICD-10-CM | POA: Diagnosis present

## 2017-07-26 DIAGNOSIS — M6281 Muscle weakness (generalized): Secondary | ICD-10-CM | POA: Diagnosis not present

## 2017-07-26 DIAGNOSIS — Z7901 Long term (current) use of anticoagulants: Secondary | ICD-10-CM | POA: Diagnosis not present

## 2017-07-26 DIAGNOSIS — S065XAA Traumatic subdural hemorrhage with loss of consciousness status unknown, initial encounter: Secondary | ICD-10-CM | POA: Diagnosis present

## 2017-07-26 DIAGNOSIS — R55 Syncope and collapse: Secondary | ICD-10-CM | POA: Diagnosis not present

## 2017-07-26 LAB — COMPREHENSIVE METABOLIC PANEL
ALT: 27 U/L (ref 17–63)
AST: 34 U/L (ref 15–41)
Albumin: 3 g/dL — ABNORMAL LOW (ref 3.5–5.0)
Alkaline Phosphatase: 29 U/L — ABNORMAL LOW (ref 38–126)
Anion gap: 6 (ref 5–15)
BUN: 20 mg/dL (ref 6–20)
CHLORIDE: 101 mmol/L (ref 101–111)
CO2: 28 mmol/L (ref 22–32)
CREATININE: 1.16 mg/dL (ref 0.61–1.24)
Calcium: 8.7 mg/dL — ABNORMAL LOW (ref 8.9–10.3)
GFR calc non Af Amer: 54 mL/min — ABNORMAL LOW (ref >60)
Glucose, Bld: 142 mg/dL — ABNORMAL HIGH (ref 65–99)
POTASSIUM: 3.7 mmol/L (ref 3.5–5.1)
SODIUM: 135 mmol/L (ref 135–145)
Total Bilirubin: 1.1 mg/dL (ref 0.3–1.2)
Total Protein: 6 g/dL — ABNORMAL LOW (ref 6.5–8.1)

## 2017-07-26 LAB — CBC
HEMATOCRIT: 39.7 % (ref 39.0–52.0)
HEMOGLOBIN: 13.1 g/dL (ref 13.0–17.0)
MCH: 30 pg (ref 26.0–34.0)
MCHC: 33 g/dL (ref 30.0–36.0)
MCV: 91.1 fL (ref 78.0–100.0)
PLATELETS: 182 10*3/uL (ref 150–400)
RBC: 4.36 MIL/uL (ref 4.22–5.81)
RDW: 15.8 % — ABNORMAL HIGH (ref 11.5–15.5)
WBC: 5.9 10*3/uL (ref 4.0–10.5)

## 2017-07-26 LAB — PROTIME-INR
INR: 1.33
Prothrombin Time: 16.3 seconds — ABNORMAL HIGH (ref 11.4–15.2)

## 2017-07-26 NOTE — Evaluation (Signed)
Physical Therapy Evaluation Patient Details Name: Donald [name] MRN: [id number] DOB: Aug 11, 1926 Today's Date: 07/26/2017   History of Present Illness  84 yo M w/ a hx of  Afib, CAD, CHF (EF 40-45%), CVA, asthma, and prostate cancer who presented after a fall.  Dx of SDH, R hip pain (imaging negative for fracture). Patient was hospitalized 4/16 > 4/25 for persistent atrial fibrillation, CHF, and AKI.    Clinical Impression  Pt admitted with above diagnosis. Pt currently with functional limitations due to the deficits listed below (see PT Problem List). Pt is active and independent with mobility at baseline. At present he requires assistance for mobility. He ambulated 6' x 2 with RW and min A, distance limited by R hip pain. Pt denied dizziness throughout PT session (see flowsheets for orthostatic vitals). ST-SNF recommended, pt/family agreeable.  Pt will benefit from skilled PT to increase their independence and safety with mobility to allow discharge to the venue listed below.       Follow Up Recommendations SNF;Supervision for mobility/OOB    Equipment Recommendations  3in1 (PT)    Recommendations for Other Services       Precautions / Restrictions Precautions Precautions: Fall Precaution Comments: fall with syncopal episode on day of admission Restrictions Weight Bearing Restrictions: No      Mobility  Bed Mobility Overal bed mobility: Needs Assistance Bed Mobility: Rolling;Sidelying to Sit Rolling: Min assist Sidelying to sit: Mod assist       General bed mobility comments: min A to support RLE and mod A to raise trunk, increased time 2* R hip pain  Transfers Overall transfer level: Needs assistance Equipment used: Rolling walker (2 wheeled) Transfers: Sit to/from Stand Sit to Stand: From elevated surface;Min assist         General transfer comment: VCs hand placement, min A to rise/steady, no dizziness in with position changes, see flowsheets for orthostatic  vitals (pt denied dizziness during entire session)  Ambulation/Gait Ambulation/Gait assistance: Min assist Ambulation Distance (Feet): 6 Feet Assistive device: Rolling walker (2 wheeled) Gait Pattern/deviations: Step-to pattern Gait velocity: decr   General Gait Details: increased time 2* R hip pain with weightbearing, VCs sequencing, 6' x 2 with RW, distance limited by pain  Stairs            Wheelchair Mobility    Modified Rankin (Stroke Patients Only)       Balance Overall balance assessment: Needs assistance;History of Falls   Sitting balance-Leahy Scale: Good     Standing balance support: Bilateral upper extremity supported Standing balance-Leahy Scale: Poor                               Pertinent Vitals/Pain Pain Assessment: 0-10 Pain Score: 7  Pain Location: R hip with weightbearing Pain Descriptors / Indicators: Sharp Pain Intervention(s): Limited activity within patient's tolerance;Monitored during session;Repositioned    Home Living Family/patient expects to be discharged to:: Private residence Living Arrangements: Alone Available Help at Discharge: Family;Available PRN/intermittently Type of Home: House Home Access: Ramped entrance     Home Layout: Able to live on main level with bedroom/bathroom Home Equipment: Walker - 2 wheels;Grab bars - toilet;Grab bars - tub/shower;Cane - single point Additional Comments: toilet is standard with 2 grab bars. 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 [employer]        Extremity/Trunk Assessment   Upper Extremity Assessment Upper Extremity Assessment: Overall WFL for tasks assessed    Lower Extremity Assessment Lower Extremity Assessment: RLE deficits/detail RLE Deficits / Details: R knee ext AROM -30* limited by pain, ankle WNL, hip flexion AAROM ~45*  limited by pain, hip strength 2/5    Cervical / Trunk Assessment Cervical / Trunk Assessment: Normal  Communication   Communication: No difficulties  Cognition Arousal/Alertness: Awake/alert Behavior During Therapy: WFL for tasks assessed/performed Overall Cognitive Status: Within Functional Limits for tasks assessed                                        General Comments      Exercises General Exercises - Lower Extremity Long Arc Quad: AROM;Right;5 reps;Seated Heel Slides: AAROM;Right;10 reps;Supine Hip ABduction/ADduction: AAROM;Right;10 reps;Supine   Assessment/Plan    PT Assessment Patient needs continued PT services  PT Problem List Decreased strength;Decreased range of motion;Decreased activity tolerance;Decreased balance;Pain;Decreased mobility       PT Treatment Interventions DME instruction;Gait training;Therapeutic activities;Functional mobility training;Therapeutic exercise;Patient/family education    PT Goals (Current goals can be found in the Care Plan section)  Acute Rehab PT Goals Patient Stated Goal: return to independence with mobility PT Goal Formulation: With patient/family Time For Goal Achievement: 08/09/17 Potential to Achieve Goals: Good    Frequency Min 3X/week   Barriers to discharge        Co-evaluation               AM-PAC PT "6 Clicks" Daily Activity  Outcome Measure Difficulty turning over in bed (including adjusting bedclothes, sheets and blankets)?: A Little Difficulty moving from lying on back to sitting on the side of the bed? : Unable Difficulty sitting down on and standing up from a chair with arms (e.g., wheelchair, bedside commode, etc,.)?: Unable Help needed moving to and from a bed to chair (including a wheelchair)?: A Little Help needed walking in hospital room?: A Lot Help needed climbing 3-5 steps with a railing? : Total 6 Click Score: 11    End of Session Equipment Utilized During Treatment: Gait  belt Activity Tolerance: Patient limited by pain Patient left: in chair;with call bell/phone within reach;with family/visitor present Nurse Communication: Mobility status PT Visit Diagnosis: Unsteadiness on feet (R26.81);History of falling (Z91.81);Difficulty in walking, not elsewhere classified (R26.2);Pain Pain - Right/Left: Right Pain - part of body: Hip    Time: 9163-8466 PT Time Calculation (min) (ACUTE ONLY): 53 min   Charges:   PT Evaluation $PT Eval Moderate Complexity: 1 Mod PT Treatments $Gait Training: 8-22 mins $Therapeutic Exercise: 8-22 mins $Therapeutic Activity: 8-22 mins   PT G Codes:          Philomena Doheny 07/26/2017, 10:44 AM [phone]

## 2017-07-26 NOTE — Consult Note (Addendum)
Cardiology Consultation:   Patient ID: Donald [name]; [id number]; [birth year]   Admit date: 07/24/2017 Date of Consult: 07/26/2017  Primary Care Provider: Haywood Pao, MD Primary Cardiologist: Dr. [name] Primary Electrophysiologist:  None   Patient Profile:   Donald [name] is a 84 y.o. male with a PMH of CAD s/p CABG 1998, chronic combined CHF, persistent atrial fibrillation s/p recent DCCV (4/18 and 4/23), prior CVA, prior mural thrombus on coumadin, prostate Ca, hx of GI bleed, who was recently hospitalized from 4/16-4/23/19 for acute on chronic combined CHF, atrial fibrillation, and AKI, who is being seen today for the evaluation of anticoagulation recommendations in the setting of SDH at the request of Dr. Thereasa Solo.  History of Present Illness:   [name] was recently discharge from the hospital on 07/22/17 after being admitted for management of his atrial fibrillation where he was started on amiodarone after unsuccessful first DCCV, and was successfully converted to NSR on second attempt. He was also treated for acute on chronic combined CHF with IV lasix and transitioned to po 40mg  BID at discharge - he was noted to have a dry weight of 173lbs. During this admission he was also taken of home bisoprolol due to hypotension.   Since discharge, family has noticed some unsteadiness on his feet and patient reported intermittent dizziness. On 07/24/17, he reports standing up and beginning to walk when he felt acutely dizzy and passed out, striking the right side of his forehead and right hip. He was found to have a 93mm acute subdural hematoma on CT scan. Neurosurgery evaluated the patient and follow-up CT revealed a stable, if not decreasing, SDH. Thankfully he has no neurological deficits.   At the time of this interview, patient reports continued right hip pain. He asked that Cardiology be involved in the decision making process with regards to his anticoagulation going forward. He  feels his breathing is stable and he denies chest pain. He has not been out of bed much given hip pain so is unsure if dizziness has persisted.   Hospital course: VSS. Weight today is 185lbs (?accuracy). Labs notable for electrolytes wnl, Cr 1.16, Hgb 13.1, PLT 182. EKG with sinus rhythm with RBBB - no change from EKG 07/21/17. CT C-spine without acute findings. CT head with 58mm acute SDH in right frontal region; stable to slightly decreased on repeat. Patient was given IVF for likely orthostatic hypotension thought to be 2/2 volume depletion and diuretics held. Neurosurgery/Primary team deferring anticoagulation recommendations to cardiology.   Past Medical History:  Diagnosis Date  . Asthma    with worsening with beta blockade  . Atrial fibrillation ([place])   . CAD (coronary artery disease)   . CVA (cerebral infarction) 2012   tia, mild no residual defecits  . GI bleed 8 yrs ago   due to doll fully vessel which was clipped  . Glaucoma    excellent cataracts  . History of kidney stones   . Ischemic cardiomyopathy   . Moderate to severe mitral regurgitation 06/16/2017  . Nephrolithiasis   . Post-infarction apical thrombus ([place])   . Prostate cancer ([place] [name]) 2012  . Radiation proctitis Feb 2015   treated with APC  . Tubular adenoma 04/2013   Dr. Hilarie Fredrickson    Past Surgical History:  Procedure Laterality Date  . cardiac stents  2003  . CARDIOVERSION N/A 06/15/2017   Procedure: CARDIOVERSION;  Surgeon: Acie Fredrickson Wonda Cheng, MD;  Location: [facility];  Service: Cardiovascular;  Laterality: N/A;  .  CARDIOVERSION N/A 07/15/2017   Procedure: CARDIOVERSION;  Surgeon: Jerline Pain, MD;  Location: [facility] ENDOSCOPY;  Service: Cardiovascular;  Laterality: N/A;  . CARDIOVERSION N/A 07/20/2017   Procedure: CARDIOVERSION;  Surgeon: Herminio Commons, MD;  Location: [facility] ENDOSCOPY;  Service: Cardiovascular;  Laterality: N/A;  . COLONOSCOPY WITH PROPOFOL N/A 05/05/2013   Procedure: COLONOSCOPY WITH PROPOFOL;   Surgeon: Jerene Bears, MD;  Location: [facility] ENDOSCOPY;  Service: Gastroenterology;  Laterality: N/A;  . CORONARY ARTERY BYPASS GRAFT  1998   LIMA to the LAD and saphenous vein graft tothe diagonal  . EXTRACORPOREAL SHOCK WAVE LITHOTRIPSY Right 04/29/2017   Procedure: RIGHT EXTRACORPOREAL SHOCK WAVE LITHOTRIPSY (ESWL);  Surgeon: Alexis Frock, MD;  Location: [facility] ORS;  Service: Urology;  Laterality: Right;  . history of radiation treatment  2012   x 40 treatments done  . KNEE ARTHROSCOPY  2016 or 2017   Dr Gladstone Lighter ;   . PARTIAL KNEE ARTHROPLASTY Right 01/13/2017   Procedure: UNICOMPARTMENTAL RIGHT KNEE;  Surgeon: Gaynelle Arabian, MD;  Location: [facility] ORS;  Service: Orthopedics;  Laterality: Right;  with block     Home Medications:  Prior to Admission medications   Medication Sig Start Date End Date Taking? Authorizing Provider  amiodarone (PACERONE) 200 MG tablet Take 2 tablets (400 mg total) by mouth 2 (two) times daily. 07/22/17  Yes Lyda Jester M, PA-C  apixaban (ELIQUIS) 2.5 MG TABS tablet Take 1 tablet (2.5 mg total) by mouth 2 (two) times daily. 07/22/17  Yes Lyda Jester M, PA-C  Cholecalciferol (VITAMIN D) 2000 UNITS tablet Take 2,000 Units by mouth daily.   Yes [provider]  fenofibrate micronized (LOFIBRA) 200 MG capsule Take 1 capsule (200 mg total) by mouth daily. 05/25/17  Yes Lelon Perla, MD  Fluticasone Furoate-Vilanterol (BREO ELLIPTA) 100-25 MCG/INH AEPB Inhale 1 puff into the lungs daily as needed (shortness of breath).    Yes [provider]  furosemide (LASIX) 40 MG tablet Take 1 tablet (40 mg total) by mouth 2 (two) times daily. 07/22/17  Yes Lyda Jester M, PA-C  hydrocortisone cream 1 % Apply 1 application topically 3 (three) times daily as needed for itching (skin irritation). 06/16/17  Yes Eugenie Filler, MD  latanoprost (XALATAN) 0.005 % ophthalmic solution Place 1 drop into the left eye at bedtime.    Yes [provider]    loratadine (CLARITIN) 10 MG tablet Take 10 mg by mouth daily.   Yes [provider]  Multiple Vitamin (MULTIVITAMIN WITH MINERALS) TABS tablet Take 1 tablet by mouth daily.   Yes [provider]  potassium chloride 20 MEQ TBCR Take 20 mEq by mouth daily. 06/28/17  Yes [name], PA  pravastatin (PRAVACHOL) 40 MG tablet Take 1 tablet (40 mg total) by mouth at bedtime. 01/16/14  Yes Lelon Perla, MD  ranolazine (RANEXA) 500 MG 12 hr tablet Take 1 tablet (500 mg total) by mouth 2 (two) times daily. 07/22/17  Yes Lyda Jester M, PA-C  Tamsulosin HCl (FLOMAX) 0.4 MG CAPS Take 0.4 mg by mouth every evening.    Yes [provider]    Inpatient Medications: Scheduled Meds: . amiodarone  400 mg Oral BID  . fenofibrate  160 mg Oral Daily  . latanoprost  1 drop Left Eye QHS  . loratadine  10 mg Oral Daily  . pravastatin  40 mg Oral QHS  . ranolazine  500 mg Oral BID  . tamsulosin  0.4 mg Oral QPM   Continuous  Infusions:  PRN Meds: acetaminophen **OR** [DISCONTINUED] acetaminophen (TYLENOL) oral liquid 160 mg/5 mL **OR** [DISCONTINUED] acetaminophen, fentaNYL (SUBLIMAZE) injection, fluticasone furoate-vilanterol, oxyCODONE, senna-docusate  Allergies:    Allergies  Allergen Reactions  . Keflex [Cephalexin] Nausea And Vomiting and Other (See Comments)    'Killed all GI enzymes" (also) and patient prefers to not take this  . Morphine Nausea Only    Social History:   Social History   Socioeconomic History  . Marital status: Widowed    Spouse name: Not on file  . Number of children: 3  . Years of education: Not on file  . Highest education level: Not on file  Occupational History  . Occupation: Retired Conservation officer, [employer]  Social Needs  . Financial resource strain: Not on file  . Food insecurity:    Worry: Not on file    Inability: Not on file  . Transportation needs:    Medical: Not on file    Non-medical: Not on file  Tobacco Use  . Smoking  status: Never Smoker  . Smokeless tobacco: Never Used  Substance and Sexual Activity  . Alcohol use: Yes    Alcohol/week: 0.0 oz    Comment: occasional beer or wine  . Drug use: No  . Sexual activity: Not Currently    Birth control/protection: Abstinence  Lifestyle  . Physical activity:    Days per week: Not on file    Minutes per session: Not on file  . Stress: Not on file  Relationships  . Social connections:    Talks on phone: Not on file    Gets together: Not on file    Attends religious service: Not on file    Active member of club or organization: Not on file    Attends meetings of clubs or organizations: Not on file    Relationship status: Not on file  . Intimate partner violence:    Fear of current or ex partner: Not on file    Emotionally abused: Not on file    Physically abused: Not on file    Forced sexual activity: Not on file  Other Topics Concern  . Not on file  Social History Narrative  . Not on file    Family History:    Family History  Problem Relation Age of Onset  . Heart attack Father   . Heart disease Mother   . Stomach cancer Mother      ROS:  Please see the history of present illness.   All other ROS reviewed and negative.     Physical Exam/Data:   Vitals:   07/26/17 0500 07/26/17 0815 07/26/17 1035 07/26/17 1245  BP:  132/79  116/75  Pulse:   80   Resp:      Temp:      TempSrc:      SpO2:   94%   Weight: 185 lb 3 oz (84 kg)     Height:        Intake/Output Summary (Last 24 hours) at 07/26/2017 1517 Last data filed at 07/26/2017 0830 Gross per 24 hour  Intake 1487.5 ml  Output 350 ml  Net 1137.5 ml   Filed Weights   07/25/17 1300 07/26/17 0500  Weight: 172 lb 13.5 oz (78.4 kg) 185 lb 3 oz (84 kg)   Body mass index is 27.35 kg/m.  General:  Elderly gentleman laying in bed in NAD HEENT: sclera anicteric; large hematoma with overlaying abrasion on R forehead/hairline Neck: no JVD appreciated Vascular: No carotid bruits;  distal pulses 2+ bilaterally Cardiac:  normal S1, S2; RRR; no murmurs, gallops, or rubs Lungs:  clear to auscultation bilaterally, no wheezing, rhonchi or rales  Abd: NABS, protuberant, soft, nontender, no hepatomegaly Ext: no edema Musculoskeletal:  No deformities, BUE and BLE strength normal and equal Skin: warm and dry  Neuro:  CNs 2-12 intact, no focal abnormalities noted Psych:  Normal affect   EKG:  The EKG was personally reviewed and demonstrates:  sinus rhythm with RBBB - no change from EKG 07/21/17. Telemetry:  Telemetry was personally reviewed and demonstrates:  Sinus rhythm with intermittent atrial fibrillation with controlled ventricular rate  Relevant CV Studies: Echocardiogram 05/2017: Study Conclusions  - Left ventricle: The cavity size was normal. Wall thickness was   increased in a pattern of mild LVH. Systolic function was mildly   to moderately reduced. The estimated ejection fraction was in the   range of 40% to 45%. Akinesis of the apical myocardium. - Ventricular septum: The contour showed systolic flattening. These   changes are consistent with RV volume and pressure overload. - Aortic valve: Trileaflet; normal thickness, mildly calcified   leaflets. Transvalvular velocity was increased. There was mild   stenosis. Valve area (VTI): 1.28 cm^2. Valve area (Vmax): 1.35   cm^2. Valve area (Vmean): 1.21 cm^2. - Aortic root: The aortic root was mildly dilated. - Mitral valve: Mildly calcified annulus. There was moderate to   severe regurgitation. - Left atrium: The atrium was severely dilated. - Right ventricle: The cavity size was dilated. Wall thickness was   normal. - Right atrium: The atrium was severely dilated. - Tricuspid valve: There was moderate regurgitation. - Pulmonary arteries: Systolic pressure could not be accurately   estimated. Suspect it is higher because of other characteristics.   PA peak pressure: 33 mm Hg (S).  Laboratory  Data:  Chemistry Recent Labs  Lab 07/22/17 0407 07/25/17 0615 07/26/17 0220  NA 136 135 135  K 3.7 3.9 3.7  CL 99* 98* 101  CO2 27 26 28   GLUCOSE 98 110* 142*  BUN 27* 24* 20  CREATININE 1.45* 1.21 1.16  CALCIUM 8.9 9.0 8.7*  GFRNONAA 41* 51* 54*  GFRAA 48* 59* >60  ANIONGAP 10 11 6     Recent Labs  Lab 07/26/17 0220  PROT 6.0*  ALBUMIN 3.0*  AST 34  ALT 27  ALKPHOS 29*  BILITOT 1.1   Hematology Recent Labs  Lab 07/21/17 0513 07/25/17 0055 07/26/17 0220  WBC 5.1 7.3 5.9  RBC 4.15* 4.48 4.36  HGB 12.6* 13.6 13.1  HCT 37.4* 40.3 39.7  MCV 90.1 90.0 91.1  MCH 30.4 30.4 30.0  MCHC 33.7 33.7 33.0  RDW 15.5 15.5 15.8*  PLT 177 194 182   Cardiac EnzymesNo results for input(s): TROPONINI in the last 168 hours. No results for input(s): TROPIPOC in the last 168 hours.  BNP Recent Labs  Lab 07/20/17 0607  BNP 980.9*    DDimer No results for input(s): DDIMER in the last 168 hours.  Radiology/Studies:  Ct Head Wo Contrast  Result Date: 07/25/2017 CLINICAL DATA:  Follow-up examination for subdural hemorrhage. EXAM: CT HEAD WITHOUT CONTRAST TECHNIQUE: Contiguous axial images were obtained from the base of the skull through the vertex without intravenous contrast. COMPARISON:  Prior CT from 07/24/2017. FINDINGS: Brain: Previously identified small right subdural hematoma is stable to slightly decreased in size from previous, measuring up to 4 mm in maximal thickness, similar to previous. No evidence for interval bleeding or expansion. No new  acute intracranial hemorrhage. Stable atrophy with chronic small vessel ischemic disease. Remote left parietal infarct noted. No acute large vessel territory infarct. No mass lesion or midline shift. No hydrocephalus. Vascular: No hyperdense vessel. Calcified atherosclerosis at the skull base. Skull: Evolving right frontal scalp contusion.  Calvarium intact. Sinuses/Orbits: No acute abnormality. Paranasal sinuses are largely clear. No  mastoid effusion. Other: None. IMPRESSION: 1. Stable to slightly decreased size of small right subdural hematoma measuring up to 4 mm in maximal thickness. No significant mass effect. 2. No other new acute intracranial abnormality. 3. Evolving right frontal scalp contusion. Electronically Signed   By: Jeannine Boga M.D.   On: 07/25/2017 06:53   Ct Head Wo Contrast  Result Date: 07/24/2017 CLINICAL DATA:  Weakness and dizziness today. Struck right forehead to the floor with abrasions. Patient on Eliquis. Patient seen last week for atrial fibrillation with cardioversion. EXAM: CT HEAD WITHOUT CONTRAST CT CERVICAL SPINE WITHOUT CONTRAST TECHNIQUE: Multidetector CT imaging of the head and cervical spine was performed following the standard protocol without intravenous contrast. Multiplanar CT image reconstructions of the cervical spine were also generated. COMPARISON:  MRI brain 12/31/2005.  CT head 12/30/2005 FINDINGS: CT HEAD FINDINGS Brain: Diffuse cerebral atrophy. Mild ventricular dilatation consistent with central atrophy. Low-attenuation changes in the deep white matter consistent small vessel ischemia. Focal area of old encephalomalacia in the left parietal convexity. Small acute subdural hematoma on the right frontal region measuring about 4 mm depth. No mass-effect or midline shift. Gray-white matter junctions are distinct. Basal cisterns are not effaced. Vascular: Intracranial arterial vascular calcifications are present. Skull: The calvarium appears intact. No acute depressed skull fractures identified. Sinuses/Orbits: Paranasal sinuses and mastoid air cells are not opacified. There is some sclerosis in the left mastoids. Other: Prominent subcutaneous scalp hematoma over the right anterior frontal region. CT CERVICAL SPINE FINDINGS Alignment: There is straightening of usual cervical lordosis without anterior subluxation, centered in area of degenerative change. This is likely degenerative. Muscle  spasm or ligamentous injury can sometimes have this appearance and are not entirely excluded. Normal alignment of the facet joints. C1-2 articulation appears intact. Skull base and vertebrae: Skull base appears intact. No vertebral compression deformities. No focal bone lesion or bone destruction. Cystic change in the base of the odontoid process is likely a degenerative cyst. Soft tissues and spinal canal: No prevertebral soft tissue swelling. No paraspinal soft tissue mass or infiltration. Disc levels: Degenerative changes throughout the cervical spine with narrowed interspaces and endplate hypertrophic changes. Degenerative changes are most severe at C5-6 and C6-7 levels. Prominent disc osteophyte complex at C5-6 causes some encroachment upon the anterior thecal sac centrally. Degenerative changes throughout the facet joints. Upper chest: Lung apices are clear. Vascular calcifications. Small amount of venous gas likely results from intravenous injections. Other: None. IMPRESSION: 1. Small acute subdural hematoma in the right frontal region measuring about 4 mm depth. No mass-effect or midline shift. 2. Chronic cerebral atrophy and small vessel ischemic changes. 3. Straightening of the usual cervical lordosis is likely degenerative. Diffuse degenerative change throughout the cervical spine. No acute displaced fractures identified. These results were called by telephone at the time of interpretation on 07/24/2017 at 9:28 pm to Dr. Fredia Sorrow , who verbally acknowledged these results. Electronically Signed   By: Lucienne Capers M.D.   On: 07/24/2017 21:29   Ct Cervical Spine Wo Contrast  Result Date: 07/24/2017 CLINICAL DATA:  Weakness and dizziness today. Struck right forehead to the floor with abrasions. Patient on  Eliquis. Patient seen last week for atrial fibrillation with cardioversion. EXAM: CT HEAD WITHOUT CONTRAST CT CERVICAL SPINE WITHOUT CONTRAST TECHNIQUE: Multidetector CT imaging of the head and  cervical spine was performed following the standard protocol without intravenous contrast. Multiplanar CT image reconstructions of the cervical spine were also generated. COMPARISON:  MRI brain 12/31/2005.  CT head 12/30/2005 FINDINGS: CT HEAD FINDINGS Brain: Diffuse cerebral atrophy. Mild ventricular dilatation consistent with central atrophy. Low-attenuation changes in the deep white matter consistent small vessel ischemia. Focal area of old encephalomalacia in the left parietal convexity. Small acute subdural hematoma on the right frontal region measuring about 4 mm depth. No mass-effect or midline shift. Gray-white matter junctions are distinct. Basal cisterns are not effaced. Vascular: Intracranial arterial vascular calcifications are present. Skull: The calvarium appears intact. No acute depressed skull fractures identified. Sinuses/Orbits: Paranasal sinuses and mastoid air cells are not opacified. There is some sclerosis in the left mastoids. Other: Prominent subcutaneous scalp hematoma over the right anterior frontal region. CT CERVICAL SPINE FINDINGS Alignment: There is straightening of usual cervical lordosis without anterior subluxation, centered in area of degenerative change. This is likely degenerative. Muscle spasm or ligamentous injury can sometimes have this appearance and are not entirely excluded. Normal alignment of the facet joints. C1-2 articulation appears intact. Skull base and vertebrae: Skull base appears intact. No vertebral compression deformities. No focal bone lesion or bone destruction. Cystic change in the base of the odontoid process is likely a degenerative cyst. Soft tissues and spinal canal: No prevertebral soft tissue swelling. No paraspinal soft tissue mass or infiltration. Disc levels: Degenerative changes throughout the cervical spine with narrowed interspaces and endplate hypertrophic changes. Degenerative changes are most severe at C5-6 and C6-7 levels. Prominent disc  osteophyte complex at C5-6 causes some encroachment upon the anterior thecal sac centrally. Degenerative changes throughout the facet joints. Upper chest: Lung apices are clear. Vascular calcifications. Small amount of venous gas likely results from intravenous injections. Other: None. IMPRESSION: 1. Small acute subdural hematoma in the right frontal region measuring about 4 mm depth. No mass-effect or midline shift. 2. Chronic cerebral atrophy and small vessel ischemic changes. 3. Straightening of the usual cervical lordosis is likely degenerative. Diffuse degenerative change throughout the cervical spine. No acute displaced fractures identified. These results were called by telephone at the time of interpretation on 07/24/2017 at 9:28 pm to Dr. Fredia Sorrow , who verbally acknowledged these results. Electronically Signed   By: Lucienne Capers M.D.   On: 07/24/2017 21:29   Dg Hip Unilat  With Pelvis 2-3 Views Right  Result Date: 07/24/2017 CLINICAL DATA:  Per eMS: Patient seen and d/c'd last week for a-fib RVR with cardioversion. Patient taking 400mg /daily of amiodarone. Today patient felt weak and dizzy (several episodes since leaving the hospital), hit right forehead to floor (abrasion, patient on eliquis), c/p right hip pain with no deformity noted EXAM: DG HIP (WITH OR WITHOUT PELVIS) 2-3V RIGHT COMPARISON:  None. FINDINGS: No fracture.  No bone lesion. Hip joints are spaced and aligned. SI joints and symphysis pubis are normally spaced and aligned. There is a soft tissue contusion lateral to the greater trochanter of the right proximal femur. IMPRESSION: 1. No fracture or dislocation. Electronically Signed   By: Lajean Manes M.D.   On: 07/24/2017 21:01    Assessment and Plan:   1. Acute subdural hematoma: occurred in the setting of syncope/collapse likely due to orthostatic hypotension. Repeat CT head with stable, if not slightly decreased SDH.  No neurological deficits. Eliquis held on  admission. - Suspect he can restart Eliquis tomorrow given stable CT head if benefits outweigh risks  2. Paroxysmal atrial fibrillation: s/p successful DCCV 07/20/17 after being started on amiodarone 400mg  BID, however evidence of paroxysmal atrial fibrillation noted on telemetry this admission.  - Can likely decrease amiodarone to 400mg  daily at this point  - CHADS2Vasc score of 4 - would benefit from [facility] going forward if benefits outweigh risks.  - Now that Cr improved, can likely restart Eliquis at 5mg  BID once cleared.   3. Chronic combined CHF: thought to be dehydrated on presentation likely 2/2 recent admission for IV diuresis and discharge on po lasix BID. He was gently hydrated and home diuretics were held. He appears euvolemic at this time. Weight 185lb today - suspect this is inaccurate as patient was weighed in the bed.  - Could consider discharging on lower dose of po lasix vs weight based diuretics   4. CAD s/p CABG: without anginal complaints - Continue ranexa and statin  5. CKD stage III: Cr improved to 1.14 this admission - Continue close monitoring after discharge   For questions or updates, please contact [place] [address] Please consult [email or web address] for contact info under Cardiology/STEMI.   Signed, Abigail Butts, PA-C  07/26/2017 3:17 PM [phone]  Personally seen and examined. Agree with above.  84 year old with acute small right sided subdural hematoma.  Stable on repeat head CT.  No neurologic deficits.  No chest pain, no shortness of breath.  Has had episodes of paroxysmal atrial fibrillation on personally reviewed telemetry.  Exam: Alert, moves all extremities, heart regular rate and rhythm, lungs are clear, no edema  Lab work personally reviewed.  Head CT personally reviewed as well.  Small subdural.  Assessment and plan  Paroxysmal atrial fibrillation -Seen during this admission.  In fact right now on telemetry he is in atrial flutter pattern.   Previously in sinus rhythm.  CHADS2Vasc score of 4. would ultimately benefit from anticoagulation to help reduce risk of embolic stroke. - I reviewed with  neurosurgery who is able to personally review the film.  Overall collectively it is felt to at least wait 72 hours to 2 weeks before resuming anticoagulation.  My opinion would be to hold for 2 weeks and have him return to clinic to make sure that he is not having any recurrent falls.  In 2 weeks, I would resume Eliquis 2.5 mg twice a day. -Also agree with discharging with decreased dose of Lasix, in fact after talking with granddaughter who is a case manager/nurse and very integral in his care, I think that as needed Lasix with base weight of approximately 170 pounds would be extremely reasonable for him.  I am worried that he has had some orthostasis/near syncope that has been exaggerated by overdiuresis. -Has appointment in the next several days with Roderic Palau, NP in atrial fibrillation clinic.  We will keep this.  He has had 4 prior cardioversions.  He is still showing signs of normal sinus rhythm occasionally however.  I would continue his amiodarone at this point.  In regards to his orthostasis, he states that without his Flomax, he does not urinate.  We will continue this.   Will follow.  [name], MD

## 2017-07-26 NOTE — Progress Notes (Signed)
Donald [name]  Donald [name]  JXB:[id number] DOB: May 11, 1926 DOA: 07/24/2017 PCP: Haywood Pao, MD    Brief Narrative:  84yo M w/ a hx of  Afib, CAD, CHF (EF 40-45%), CVA, asthma, and prostate cancer who presented after a fall.  Patient was hospitalized 4/16 > 4/25 for persistent atrial fibrillation, CHF, and AKI.  During that hospitalization patient underwent DCCV on 4/18 and 4/23.  Medication changes included discontinuation of bisoprolol due to low blood pressures, increase in amiodarone from 200 to 400 mg per day, and increase in Lasix from 40 mg daily to 40 mg BID.  Patient reported feeling lightheaded with standing.  The day of his admit he stood up, began walking, felt dizzy, then passed out, striking the right side of his forehead and right hip when landing.   In the ED BUN was 27 and creatinine 1.45.  CTbrain showed an acute 4 mm subdural hematoma. X-rays of right hip were negative for acute fracture.    Significant Events: 4/27 admit   Subjective: Right-sided hip pain persists.  This is making him unstable in his attempts to ambulate.  He denies chest pain shortness of breath fevers chills.  He remarkably denies headache.   Assessment & Plan:  Traumatic subdural hematoma secondary to fall Neurosurgery has completed an evaluation - stable on f/u CT head 4/28 - no focal neuro deficits today    Syncope and collapse Hx most c/w orthostatic hypotension in the setting of volume depletion - gently hydrating - holding diuretic for now, which greatly concerns patient - he asks that his Cardiologist be involved in his care - pt not orthostatic this AM when working w/ PT, and clinically appears euvolemic at this time    Right hip pain / hematoma of scalp / skin tears Symptomatic treatment - begin PT/OT - will need a SNF rehab stay - if pain does not slowly improve will obtain CT hip to assure an occult fx has not occurred   Paroxysmal atrial  fibrillation CHA2DS2-VASc is 4 - holding anticoag - rate controlled - pt asks that his Cardiologist be involved in his care and I have consulted them today   Chronic Systolic CHF EF 13-08% by TTE 06/13/2017 - dry weight is reportedly around 173 pounds - hold diuretic for now - will likely need weight based diuretic dosing instead of scheduled dosing, but will leave thi to Cards - I do not feel this weight today is accurate - net + ~840cc since admit   Filed Weights   07/25/17 1300 07/26/17 0500  Weight: 78.4 kg (172 lb 13.5 oz) 84 kg (185 lb 3 oz)    Asthma/COPD Continue Breo - well compensated   Hyperlipidemia Continue pravastatin   AKI on Chronic kidney disease stage III crt much improved w/ hydration - follow trend   Recent Labs  Lab 07/20/17 0607 07/21/17 0513 07/22/17 0407 07/25/17 0615 07/26/17 0220  CREATININE 1.64* 1.60* 1.45* 1.21 1.16    BPH Continue Flomax  DVT prophylaxis: SCDs Code Status: DNR - NO CODE  Family Communication: spoke w/ daughter at bedside   Disposition Plan: transfer to tele bed - anticipate transition to full inpatient status given need to titrate diuretic, follow renal fxn, and assess further for possible occult hip fx - Cards consulted - will need SNF rehab stay after d/c   Consultants:  [facility]  Antimicrobials:  none  Objective: Blood pressure 110/75, pulse 81, temperature 98 F (36.7 C),  temperature source Oral, resp. rate 20, height 5\' 9"  (1.753 m), weight 84 kg (185 lb 3 oz), SpO2 94 %.  Intake/Output Summary (Last 24 hours) at 07/26/2017 1023 Last data filed at 07/26/2017 0830 Gross per 24 hour  Intake 1487.5 ml  Output 350 ml  Net 1137.5 ml   Filed Weights   07/25/17 1300 07/26/17 0500  Weight: 78.4 kg (172 lb 13.5 oz) 84 kg (185 lb 3 oz)    Examination: General: No acute respiratory distress - A&Ox4 Lungs: Clear to auscultation bilaterally - no wheezing  Cardiovascular: Regular rate without murmur gallop  or rub  Abdomen: NT/ND, soft, bs+, no mass  Extremities: No signif edema B LE   CBC: Recent Labs  Lab 07/21/17 0513 07/25/17 0055 07/26/17 0220  WBC 5.1 7.3 5.9  NEUTROABS  --  6.3  --   HGB 12.6* 13.6 13.1  HCT 37.4* 40.3 39.7  MCV 90.1 90.0 91.1  PLT 177 194 785   Basic Metabolic Panel: Recent Labs  Lab 07/20/17 0607 07/21/17 0513 07/22/17 0407 07/25/17 0615 07/26/17 0220  NA 136 137 136 135 135  K 4.0 4.0 3.7 3.9 3.7  CL 100* 99* 99* 98* 101  CO2 28 28 27 26 28   GLUCOSE 116* 109* 98 110* 142*  BUN 33* 31* 27* 24* 20  CREATININE 1.64* 1.60* 1.45* 1.21 1.16  CALCIUM 9.1 9.1 8.9 9.0 8.7*  MG 1.7 2.1  --   --   --    GFR: Estimated Creatinine Clearance: 43.2 mL/min (by C-G formula based on SCr of 1.16 mg/dL).  Liver Function Tests: Recent Labs  Lab 07/26/17 0220  AST 34  ALT 27  ALKPHOS 29*  BILITOT 1.1  PROT 6.0*  ALBUMIN 3.0*    HbA1C: Hgb A1c MFr Bld  Date/Time Value Ref Range Status  07/15/2017 04:24 AM 5.9 (H) 4.8 - 5.6 % Final    Comment:    (NOTE) Pre diabetes:          5.7%-6.4% Diabetes:              >6.4% Glycemic control for   <7.0% adults with diabetes      Recent Results (from the past 240 hour(s))  MRSA PCR Screening     Status: None   Collection Time: 07/25/17  1:20 PM  Result Value Ref Range Status   MRSA by PCR NEGATIVE NEGATIVE Final    Comment:        The GeneXpert MRSA Assay (FDA approved for NASAL specimens only), is one component of a comprehensive MRSA colonization surveillance program. It is not intended to diagnose MRSA infection nor to guide or monitor treatment for MRSA infections. Performed at [facility], [place] [address] [place], [place] [address]      Scheduled Meds: . amiodarone  400 mg Oral BID  . fenofibrate  160 mg Oral Daily  . latanoprost  1 drop Left Eye QHS  . loratadine  10 mg Oral Daily  . pravastatin  40 mg Oral QHS  . ranolazine  500 mg Oral BID  . tamsulosin  0.4 mg Oral QPM      LOS: 0 days   Cherene Altes, MD [facility] Office  [phone] Pager - Text Page per Amion as per below:  On-Call/Text Page:      [email or web address]      password [email or web address]  If 7PM-7AM, please contact night-coverage [email or web address] Password [email or web address] 07/26/2017, 10:23 AM

## 2017-07-26 NOTE — Progress Notes (Signed)
Pt transferred from 4N, alert and oriented, denies any pain at this time, pt settled in bed with call light within reach, tele monitor put and verified on pt, safety concern addressed accordingly, pt was however reassured and will continue to monitor, v/s stable. Obasogie-Asidi, Cheray Pardi Efe

## 2017-07-26 NOTE — Consult Note (Addendum)
   [facility] Inpatient Consult   07/26/2017  Donald [name] 1926-07-18 [id number]   Spoke with Donald [name] at bedside about [place] Management program. Donald [name] states " I was expecting you. My grandaugter told me you would come by". Explained [facility] Care Management program and Donald [name] is agreeable and written consent obtained.   Donald [name] recently discharged and readmitted to hospital. He sustained a traumatic SDH. Donald [name] states his discharge plan is likely short term rehab.   Donald [name] lives alone.  Primary Care MD is Dr. Osborne Casco. He denies having concerns with medications.  Friends and neighbors assist with taking him to MD appointments if he does not drive.  Listed emergency contact as Haze [employer] account executive) who works is  [employer] at [employer]. . Donald [name] provides cell number as [phone] and home number [phone].  [facility] Care Management folder provided with contact information.   Donald [name] has had x3 hospitalizations with a medical history of Afib, CAD, CHF, CVA, asthma, CHF, AKI.   Inpatient RNCM made aware of bedside visit.   Will await to assign to [place] team once disposition plan is known.   Marthenia Rolling, MSN-Ed, RN,BSN [facility] Liaison [phone]

## 2017-07-26 NOTE — Evaluation (Signed)
Occupational Therapy Evaluation Patient Details Name: Donald [name] MRN: [id number] DOB: 05/19/1926 Today's Date: 07/26/2017    History of Present Illness 84 yo M w/ a hx of  Afib, CAD, CHF (EF 40-45%), CVA, asthma, and prostate cancer who presented after a fall.  Dx of SDH, R hip pain (imaging negative for fracture). Patient was hospitalized 4/16 > 4/25 for persistent atrial fibrillation, CHF, and AKI.     Clinical Impression   This 84 y/o male presents with the above. At baseline pt is independent with ADLs and functional mobility, lives alone. Pt requiring MinA for sit<>stand at [place] and to maintain static standing during grooming ADLs this session. Pt limited by pain in R hip limiting his overall mobility and functional performance. Pt requiring Mod-MaxA for LB ADLs, reports increased difficulty with bil grip strength and fine motor tasks. Pt is very motivated to work with therapy to return to his PLOF. Pt will benefit from continued acute OT services and recommend SNF level OT services after discharge to maximize his overall safety and independence with ADLs and mobility prior to return home.     Follow Up Recommendations  SNF;Supervision/Assistance - 24 hour    Equipment Recommendations  3 in 1 bedside commode;Other (comment)(TBD in next venue )           Precautions / Restrictions Precautions Precautions: Fall Precaution Comments: fall with syncopal episode on day of admission Restrictions Weight Bearing Restrictions: No      Mobility Bed Mobility Overal bed mobility: Needs Assistance Bed Mobility: Rolling;Sidelying to Sit Rolling: Min assist Sidelying to sit: Mod assist       General bed mobility comments: OOB in recliner upon arrival   Transfers Overall transfer level: Needs assistance Equipment used: Rolling walker (2 wheeled) Transfers: Sit to/from Stand Sit to Stand: Min assist         General transfer comment: VCs for safe hand placement; assist to rise  and steady, completing sit<>stand x2 during session     Balance Overall balance assessment: Needs assistance;History of Falls   Sitting balance-Leahy Scale: Good     Standing balance support: Bilateral upper extremity supported;Single extremity supported;During functional activity Standing balance-Leahy Scale: Poor Standing balance comment: reliant on UE support/external support from therapist                            ADL either performed or assessed with clinical judgement   ADL Overall ADL's : Needs assistance/impaired Eating/Feeding: Modified independent;Sitting   Grooming: Oral care;Minimal assistance;Standing Grooming Details (indicate cue type and reason): Min steadying assist in standing  Upper Body Bathing: Min guard;Sitting   Lower Body Bathing: Moderate assistance;Sit to/from stand   Upper Body Dressing : Sitting;Min guard   Lower Body Dressing: Maximal assistance;Sit to/from stand       Toileting- Water quality scientist and Hygiene: Maximal assistance;Sit to/from stand       Functional mobility during ADLs: Minimal assistance;Rolling walker General ADL Comments: pt completing sit<>stand and small step forward/back from recliner (unable to ambulate further due to hip pain); transitioned recliner in front of sink; pt completing sit<>stand at sink for standing grooming ADLs, standing >20min during activity completion                    Pertinent Vitals/Pain Pain Assessment: Faces Pain Score: 7  Faces Pain Scale: Hurts even more Pain Location: R hip with weightbearing Pain Descriptors / Indicators: Sharp;Guarding;Grimacing Pain Intervention(s): Monitored during  session;Repositioned     Hand Dominance Right   Extremity/Trunk Assessment Upper Extremity Assessment Upper Extremity Assessment: RUE deficits/detail;LUE deficits/detail RUE Deficits / Details: pt reports increased difficulty holding items; decreased coordination  RUE Coordination:  decreased fine motor LUE Deficits / Details: pt reports increased difficulty holding items; decreased coordination  LUE Coordination: decreased fine motor   Lower Extremity Assessment Lower Extremity Assessment: Defer to PT evaluation RLE Deficits / Details: R knee ext AROM -30* limited by pain, ankle WNL, hip flexion AAROM ~45* limited by pain, hip strength 2/5   Cervical / Trunk Assessment Cervical / Trunk Assessment: Normal   Communication Communication Communication: No difficulties   Cognition Arousal/Alertness: Awake/alert Behavior During Therapy: WFL for tasks assessed/performed Overall Cognitive Status: Within Functional Limits for tasks assessed                                                     Home Living Family/patient expects to be discharged to:: Private residence Living Arrangements: Alone Available Help at Discharge: Family;Available PRN/intermittently Type of Home: House Home Access: Ramped entrance     Home Layout: Able to live on main level with bedroom/bathroom     Bathroom Shower/Tub: Occupational psychologist: Standard     Home Equipment: Walker - 2 wheels;Grab bars - toilet;Grab bars - tub/shower;Cane - single point;Shower seat   Additional Comments: toilet is standard with 2 grab bars. 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 [place] Problem List: Decreased strength;Impaired balance (sitting and/or standing);Pain;Decreased range of motion;Decreased activity tolerance;Decreased knowledge of use of DME or AE      OT Treatment/Interventions: Self-care/ADL training;DME and/or AE instruction;Therapeutic activities;Balance training;Therapeutic exercise;Patient/family education    OT Goals(Current goals can be found in the care plan section) Acute Rehab OT Goals Patient  Stated Goal: return to independence OT Goal Formulation: With patient Time For Goal Achievement: 08/09/17  OT Frequency: Min 2X/week                             AM-PAC PT "6 Clicks" Daily Activity     Outcome Measure Help from another person eating meals?: None Help from another person taking care of personal grooming?: A Little Help from another person toileting, which includes using toliet, bedpan, or urinal?: A Little Help from another person bathing (including washing, rinsing, drying)?: A Lot Help from another person to put on and taking off regular upper body clothing?: None Help from another person to put on and taking off regular lower body clothing?: A Lot 6 Click Score: 18   End of Session Equipment Utilized During Treatment: Gait belt;Rolling walker Nurse Communication: Mobility status  Activity Tolerance: Patient tolerated treatment well;Patient limited by pain Patient left: in chair;with call bell/phone within reach;with family/visitor present  OT Visit Diagnosis: Unsteadiness on feet (R26.81);History of falling (Z91.81);Pain Pain - Right/Left: Right Pain - part of body: Hip                Time: 3235-5732 OT Time Calculation (min): 27 min Charges:  OT General Charges $OT Visit: 1  Visit OT Evaluation $OT Eval Moderate Complexity: 1 Mod OT Treatments $Self Care/Home Management : 8-22 mins G-Codes:     Lou Cal, OT Pager [phone] 07/26/2017   Raymondo Band 07/26/2017, 1:08 PM

## 2017-07-27 ENCOUNTER — Other Ambulatory Visit: Payer: Self-pay

## 2017-07-27 LAB — CBC
HEMATOCRIT: 38.8 % — AB (ref 39.0–52.0)
Hemoglobin: 12.7 g/dL — ABNORMAL LOW (ref 13.0–17.0)
MCH: 30 pg (ref 26.0–34.0)
MCHC: 32.7 g/dL (ref 30.0–36.0)
MCV: 91.7 fL (ref 78.0–100.0)
Platelets: 178 10*3/uL (ref 150–400)
RBC: 4.23 MIL/uL (ref 4.22–5.81)
RDW: 15.9 % — ABNORMAL HIGH (ref 11.5–15.5)
WBC: 6.3 10*3/uL (ref 4.0–10.5)

## 2017-07-27 LAB — BASIC METABOLIC PANEL
Anion gap: 9 (ref 5–15)
BUN: 16 mg/dL (ref 6–20)
CHLORIDE: 96 mmol/L — AB (ref 101–111)
CO2: 30 mmol/L (ref 22–32)
Calcium: 9 mg/dL (ref 8.9–10.3)
Creatinine, Ser: 1.16 mg/dL (ref 0.61–1.24)
GFR calc Af Amer: >60 mL/min (ref >60)
GFR calc non Af Amer: 54 mL/min — ABNORMAL LOW (ref >60)
GLUCOSE: 113 mg/dL — AB (ref 65–99)
POTASSIUM: 4.1 mmol/L (ref 3.5–5.1)
Sodium: 135 mmol/L (ref 135–145)

## 2017-07-27 MED ORDER — AMIODARONE HCL 200 MG PO TABS
400.0000 mg | ORAL_TABLET | Freq: Every day | ORAL | Status: DC
  Administered 2017-07-28: 400 mg via ORAL
  Filled 2017-07-27: qty 2

## 2017-07-27 MED ORDER — FUROSEMIDE 20 MG PO TABS
20.0000 mg | ORAL_TABLET | Freq: Once | ORAL | Status: AC
  Administered 2017-07-28: 20 mg via ORAL
  Filled 2017-07-27: qty 1

## 2017-07-27 NOTE — NC FL2 (Signed)
[place] LEVEL OF CARE SCREENING TOOL     IDENTIFICATION  Patient Name: Donald [name] Birthdate: [birth year] Sex: male Admission Date (Current Location): 07/24/2017  [facility] and [place] Number:  Herbalist and Address:  [facility] [place], [place] [address], [place], [place] [address]      Provider Number: [id number]  Attending Physician Name and Address:  Cherene Altes, MD  Relative Name and Phone Number:       Current Level of Care: Hospital Recommended Level of Care: [place] Prior Approval Number:    Date Approved/Denied:   PASRR Number: [id number]  Discharge Plan: SNF    Current Diagnoses: Patient Active Problem List   Diagnosis Date Noted  . Subdural hematoma ([place]) 07/26/2017  . Contusion of right hip   . Syncope 07/25/2017  . Traumatic subdural hematoma ([place]) 07/24/2017  . History of cardioversion 07/20/2017  . Persistent atrial fibrillation ([place])   . Chronic systolic CHF (congestive heart failure) ([place]) 07/13/2017  . Moderate to severe mitral regurgitation 06/16/2017  . Acute combined systolic and diastolic congestive heart failure ([place]) 06/16/2017  . New onset of congestive heart failure ([place]) 06/11/2017  . AKI (acute kidney injury) ([place]) 06/11/2017  . Normocytic normochromic anemia 06/11/2017  . OA (osteoarthritis) of knee 01/13/2017  . Encounter for therapeutic drug monitoring 05/10/2013  . Radiation proctitis 05/05/2013  . Colonic polyp 05/05/2013  . Chest pain 09/17/2011  . PAF (paroxysmal atrial fibrillation) ([place]) 05/19/2010  . MURAL THROMBUS, APEX OF HEART 09/18/2008  . HYPERLIPIDEMIA 09/14/2008  . CATARACTS 09/14/2008  . Coronary atherosclerosis 09/14/2008  . CARDIOMYOPATHY 09/14/2008  . CEREBROVASCULAR ACCIDENT 09/14/2008  . TRANSIENT ISCHEMIC ATTACK 09/14/2008  . ASTHMA 09/14/2008  . GUAIAC POSITIVE STOOL 09/14/2008    Orientation RESPIRATION BLADDER Height & Weight      Self, Time, Situation, Place  Normal Continent Weight: 180 lb 12.4 oz (82 kg) Height:  5\' 9"  (175.3 cm)  BEHAVIORAL SYMPTOMS/MOOD NEUROLOGICAL BOWEL NUTRITION STATUS      Continent Diet(heart healthy)  AMBULATORY STATUS COMMUNICATION OF NEEDS Skin   Extensive Assist Verbally Normal                       Personal Care Assistance Level of Assistance  Bathing, Feeding, Dressing Bathing Assistance: Limited assistance Feeding assistance: Independent Dressing Assistance: Limited assistance     Functional Limitations Info  Sight, Hearing, Speech Sight Info: Impaired Hearing Info: Impaired(hearing aid) Speech Info: Adequate    SPECIAL CARE FACTORS FREQUENCY  PT (By licensed PT), OT (By licensed OT)     PT Frequency: 5x/wk OT Frequency: 5x/wk            Contractures Contractures Info: Not present    Additional Factors Info  Code Status, Allergies Code Status Info: DNR Allergies Info: Keflex Cephalexin, Morphine           Current Medications (07/27/2017):  This is the current hospital active medication list Current Facility-Administered Medications  Medication Dose Route Frequency Provider Last Rate Last Dose  . acetaminophen (TYLENOL) tablet 650 mg  650 mg Oral Q4H PRN Cherene Altes, MD      . amiodarone (PACERONE) tablet 400 mg  400 mg Oral BID Fuller Plan A, MD   400 mg at 07/27/17 5573  . fenofibrate tablet 160 mg  160 mg Oral Daily Fuller Plan A, MD   160 mg at 07/27/17 0918  . fentaNYL (SUBLIMAZE) injection 12.5-25 mcg  12.5-25 mcg Intravenous Q2H PRN Joette Catching T, MD      . fluticasone furoate-vilanterol (BREO ELLIPTA) 100-25 MCG/INH 1 puff  1 puff Inhalation Daily PRN Smith, Rondell A, MD      . latanoprost (XALATAN) 0.005 % ophthalmic solution 1 drop  1 drop Left Eye QHS Smith, Rondell A, MD   1 drop at 07/25/17 2132  . loratadine (CLARITIN) tablet 10 mg  10 mg Oral Daily Fuller Plan A, MD   10 mg at 07/27/17 0918  . oxyCODONE (Oxy  IR/ROXICODONE) immediate release tablet 5-10 mg  5-10 mg Oral Q4H PRN Cherene Altes, MD   10 mg at 07/25/17 2325  . pravastatin (PRAVACHOL) tablet 40 mg  40 mg Oral QHS Smith, Rondell A, MD   40 mg at 07/26/17 2209  . ranolazine (RANEXA) 12 hr tablet 500 mg  500 mg Oral BID Fuller Plan A, MD   500 mg at 07/27/17 0918  . senna-docusate (Senokot-S) tablet 1 tablet  1 tablet Oral QHS PRN Fuller Plan A, MD      . tamsulosin (FLOMAX) capsule 0.4 mg  0.4 mg Oral QPM Smith, Rondell A, MD   0.4 mg at 07/26/17 1738     Discharge Medications: Please see discharge summary for a list of discharge medications.  Relevant Imaging Results:  Relevant Lab Results:   Additional Information SS#: [id number]  Geralynn Ochs, LCSW

## 2017-07-27 NOTE — Discharge Instructions (Addendum)
Follow with [name], Donald Him, MD in 5-7 days Follow up with Dr. [name] in 2 weeks  Please get a complete blood count and chemistry panel checked by your Primary MD at your next visit, and again as instructed by your Primary MD. Please get your medications reviewed and adjusted by your Primary MD.  Please request your Primary MD to go over all Hospital Tests and Procedure/Radiological results at the follow up, please get all Hospital records sent to your Prim MD by signing hospital release before you go home.  If you had Pneumonia of Lung problems at the Hospital: Please get a 2 view Chest X ray done in 6-8 weeks after hospital discharge or sooner if instructed by your Primary MD.  If you have Congestive Heart Failure: Please call your Cardiologist or Primary MD anytime you have any of the following symptoms:  1) 3 pound weight gain in 24 hours or 5 pounds in 1 week  2) shortness of breath, with or without a dry hacking cough  3) swelling in the hands, feet or stomach  4) if you have to sleep on extra pillows at night in order to breathe  Follow cardiac low salt diet and 1.5 lit/day fluid restriction.  If you have diabetes Accuchecks 4 times/day, Once in AM empty stomach and then before each meal. Log in all results and show them to your primary doctor at your next visit. If any glucose reading is under 80 or above 300 call your primary MD immediately.  If you have Seizure/Convulsions/Epilepsy: Please do not drive, operate heavy machinery, participate in activities at heights or participate in high speed sports until you have seen by Primary MD or a Neurologist and advised to do so again.  If you had Gastrointestinal Bleeding: Please ask your Primary MD to check a complete blood count within one week of discharge or at your next visit. Your endoscopic/colonoscopic biopsies that are pending at the time of discharge, will also need to followed by your Primary MD.  Get Medicines  reviewed and adjusted. Please take all your medications with you for your next visit with your Primary MD  Please request your Primary MD to go over all hospital tests and procedure/radiological results at the follow up, please ask your Primary MD to get all Hospital records sent to his/her office.  If you experience worsening of your admission symptoms, develop shortness of breath, life threatening emergency, suicidal or homicidal thoughts you must seek medical attention immediately by calling 911 or calling your MD immediately  if symptoms less severe.  You must read complete instructions/literature along with all the possible adverse reactions/side effects for all the Medicines you take and that have been prescribed to you. Take any new Medicines after you have completely understood and accpet all the possible adverse reactions/side effects.   Do not drive or operate heavy machinery when taking Pain medications.   Do not take more than prescribed Pain, Sleep and Anxiety Medications  Special Instructions: If you have smoked or chewed Tobacco  in the last 2 yrs please stop smoking, stop any regular Alcohol  and or any Recreational drug use.  Wear Seat belts while driving.  Please note You were cared for by a hospitalist during your hospital stay. If you have any questions about your discharge medications or the care you received while you were in the hospital after you are discharged, you can call the unit and asked to speak with the hospitalist on call if the hospitalist  that took care of you is not available. Once you are discharged, your primary care physician will handle any further medical issues. Please note that NO REFILLS for any discharge medications will be authorized once you are discharged, as it is imperative that you return to your primary care physician (or establish a relationship with a primary care physician if you do not have one) for your aftercare needs so that they can  reassess your need for medications and monitor your lab values.  You can reach the hospitalist office at phone [phone] or fax [phone]   If you do not have a primary care physician, you can call [phone] for a physician referral.  Activity: As tolerated with Full fall precautions use walker/cane & assistance as needed  Diet: heart healthy  Disposition SNF

## 2017-07-27 NOTE — Progress Notes (Signed)
Progress Note  Patient Name: Donald [name] Date of Encounter: 07/27/2017  Primary Cardiologist: Kirk Ruths, MD   Subjective   Feels better. Resting. No CP, no SOB.   Inpatient Medications    Scheduled Meds: . [START ON 07/28/2017] amiodarone  400 mg Oral Daily  . fenofibrate  160 mg Oral Daily  . [START ON 07/28/2017] furosemide  20 mg Oral Once  . latanoprost  1 drop Left Eye QHS  . loratadine  10 mg Oral Daily  . pravastatin  40 mg Oral QHS  . ranolazine  500 mg Oral BID  . tamsulosin  0.4 mg Oral QPM   Continuous Infusions:  PRN Meds: acetaminophen **OR** [DISCONTINUED] acetaminophen (TYLENOL) oral liquid 160 mg/5 mL **OR** [DISCONTINUED] acetaminophen, fentaNYL (SUBLIMAZE) injection, fluticasone furoate-vilanterol, oxyCODONE, senna-docusate   Vital Signs    Vitals:   07/27/17 0400 07/27/17 0500 07/27/17 0700 07/27/17 1124  BP: 101/72  103/69 111/73  Pulse:      Resp: 18     Temp:   98.4 F (36.9 C) 98.3 F (36.8 C)  TempSrc:   Oral Oral  SpO2: 95%     Weight:  180 lb 12.4 oz (82 kg)    Height:        Intake/Output Summary (Last 24 hours) at 07/27/2017 1638 Last data filed at 07/27/2017 0648 Gross per 24 hour  Intake 120 ml  Output 1050 ml  Net -930 ml   Filed Weights   07/25/17 1300 07/26/17 0500 07/27/17 0500  Weight: 172 lb 13.5 oz (78.4 kg) 185 lb 3 oz (84 kg) 180 lb 12.4 oz (82 kg)    Telemetry    Brief periods of NSR, PAF, Atrial flutter - Personally Reviewed  ECG    No new - Personally Reviewed  Physical Exam   GEN: No acute distress.   Neck: No JVD, right forehead hematoma.  Cardiac: RRR, no murmurs, rubs, or gallops.  Respiratory: Clear to auscultation bilaterally. GI: Soft, nontender, non-distended  MS: No edema; No deformity. Neuro:  Nonfocal  Psych: Normal affect   Labs    Chemistry Recent Labs  Lab 07/25/17 0615 07/26/17 0220 07/27/17 0439  NA 135 135 135  K 3.9 3.7 4.1  CL 98* 101 96*  CO2 26 28 30   GLUCOSE  110* 142* 113*  BUN 24* 20 16  CREATININE 1.21 1.16 1.16  CALCIUM 9.0 8.7* 9.0  PROT  --  6.0*  --   ALBUMIN  --  3.0*  --   AST  --  34  --   ALT  --  27  --   ALKPHOS  --  29*  --   BILITOT  --  1.1  --   GFRNONAA 51* 54* 54*  GFRAA 59* >60 >60  ANIONGAP 11 6 9      Hematology Recent Labs  Lab 07/25/17 0055 07/26/17 0220 07/27/17 0439  WBC 7.3 5.9 6.3  RBC 4.48 4.36 4.23  HGB 13.6 13.1 12.7*  HCT 40.3 39.7 38.8*  MCV 90.0 91.1 91.7  MCH 30.4 30.0 30.0  MCHC 33.7 33.0 32.7  RDW 15.5 15.8* 15.9*  PLT 194 182 178    Cardiac EnzymesNo results for input(s): TROPONINI in the last 168 hours. No results for input(s): TROPIPOC in the last 168 hours.   BNPNo results for input(s): BNP, PROBNP in the last 168 hours.   DDimer No results for input(s): DDIMER in the last 168 hours.   Radiology    No results found.  Cardiac Studies   No new  Patient Profile     84 y.o. male with SDH, PAF  Assessment & Plan    PAF  - consider restarting anticoagulation in 2 weeks if not sustaining any more falls.   - Continue with amiodarone (showing periods of NSR)  - In 2 weeks consider decreased dose of amiodarone.   SDH  - stable. 43mm.   Orthostatic hypotension  - recommend PRN lasix at this point. Try not to exacerbate orthostasis.   Will sign off. Please call if ?  For questions or updates, please contact [place] Please consult [email or web address] for contact info under Cardiology/STEMI.      Signed, [name], MD  07/27/2017, 4:38 PM

## 2017-07-27 NOTE — Progress Notes (Signed)
Donald [name] TEAM 1 - Stepdown/ICU TEAM  Donald [name]  SWF:[id number] DOB: 05-Dec-1926 DOA: 07/24/2017 PCP: Haywood Pao, MD    Brief Narrative:  84yo M w/ a hx of  Afib, CAD, CHF (EF 40-45%), CVA, asthma, and prostate cancer who presented after a fall.  Patient was hospitalized 4/16 > 4/25 for persistent atrial fibrillation, CHF, and AKI.  During that hospitalization patient underwent DCCV on 4/18 and 4/23.  Medication changes included discontinuation of bisoprolol due to low blood pressures, increase in amiodarone from 200 to 400 mg per day, and increase in Lasix from 40 mg daily to 40 mg BID.  Patient reported feeling lightheaded with standing.  The day of his admit he stood up, began walking, felt dizzy, then passed out, striking the right side of his forehead and right hip when landing.   In the ED BUN was 27 and creatinine 1.45.  CTbrain showed an acute 4 mm subdural hematoma. X-rays of right hip were negative for acute fracture.    Significant Events: 4/27 admit   Subjective: Resting comfortably in bed.  States that his R hip pain has improved significantly since yesterday.  Denies CP, sob, n/v, or abdom pain.     Assessment & Plan:  Traumatic subdural hematoma secondary to fall Neurosurgery has completed an evaluation - stable on f/u CT head 4/28 - no focal neuro deficits again today - plan to hold anticoag for 2 weeks, and f/u w/ his Cardiologist at the end of that 2 weeks to determine appropriateness of resuming anticoag  Syncope and collapse Hx most c/w orthostatic hypotension in the setting of volume depletion - gently hydrated - clinically appears euvolemic at this time - not orthostatic at this time   Right hip pain / hematoma of scalp / skin tears Symptomatic treatment - cont PT/OT - will need a SNF rehab stay   Paroxysmal atrial fibrillation CHA2DS2-VASc is 4 - holding anticoag - rate controlled - plan to hold anticoag for 2 weeks, and f/u w/ his Cardiologist at  the end of that 2 weeks to determine appropriateness of resuming anticoag  Chronic Systolic CHF EF 22-02% by TTE 06/13/2017 - dry weight is reportedly around 173 pounds but pt was reportedly 172# at admit when he was orthostatic - holding diuretic for now - plan to institute weight based diuretic dosing instead of scheduled dosing at time of d/c, w/ goal weight being ~176# (this may need to be adjusted over time based on volume status/symptoms) - net negative ~650 for this admit today   Filed Weights   07/25/17 1300 07/26/17 0500 07/27/17 0500  Weight: 78.4 kg (172 lb 13.5 oz) 84 kg (185 lb 3 oz) 82 kg (180 lb 12.4 oz)    Asthma/COPD Continue Breo - well compensated   Hyperlipidemia Continue pravastatin   AKI on Chronic kidney disease stage III crt much improved w/ hydration - follow trend w/ stopping of IVF   Recent Labs  Lab 07/21/17 0513 07/22/17 0407 07/25/17 0615 07/26/17 0220 07/27/17 0439  CREATININE 1.60* 1.45* 1.21 1.16 1.16    BPH Continue Flomax  DVT prophylaxis: SCDs Code Status: DNR - NO CODE  Family Communication: spoke w/ daughter at bedside   Disposition Plan: tele bed - should be ready for SNF rehab stay 5/1  Consultants:  [facility]  Antimicrobials:  none  Objective: Blood pressure 111/73, pulse 77, temperature 98.3 F (36.8 C), temperature source Oral, resp. rate 18, height 5\' 9"  (1.753 m), weight 82  kg (180 lb 12.4 oz), SpO2 95 %.  Intake/Output Summary (Last 24 hours) at 07/27/2017 1500 Last data filed at 07/27/2017 0648 Gross per 24 hour  Intake 120 ml  Output 1400 ml  Net -1280 ml   Filed Weights   07/25/17 1300 07/26/17 0500 07/27/17 0500  Weight: 78.4 kg (172 lb 13.5 oz) 84 kg (185 lb 3 oz) 82 kg (180 lb 12.4 oz)    Examination: General: No acute respiratory distress  Lungs: CTA B - no wheezing  Cardiovascular: Regular rate - no M or rub  Abdomen: NT/ND, soft, bs+, no mass  Extremities: No signif edema B LE again today     CBC: Recent Labs  Lab 07/25/17 0055 07/26/17 0220 07/27/17 0439  WBC 7.3 5.9 6.3  NEUTROABS 6.3  --   --   HGB 13.6 13.1 12.7*  HCT 40.3 39.7 38.8*  MCV 90.0 91.1 91.7  PLT 194 182 825   Basic Metabolic Panel: Recent Labs  Lab 07/21/17 0513  07/25/17 0615 07/26/17 0220 07/27/17 0439  NA 137   < > 135 135 135  K 4.0   < > 3.9 3.7 4.1  CL 99*   < > 98* 101 96*  CO2 28   < > 26 28 30   GLUCOSE 109*   < > 110* 142* 113*  BUN 31*   < > 24* 20 16  CREATININE 1.60*   < > 1.21 1.16 1.16  CALCIUM 9.1   < > 9.0 8.7* 9.0  MG 2.1  --   --   --   --    < > = values in this interval not displayed.   GFR: Estimated Creatinine Clearance: 43.2 mL/min (by C-G formula based on SCr of 1.16 mg/dL).  Liver Function Tests: Recent Labs  Lab 07/26/17 0220  AST 34  ALT 27  ALKPHOS 29*  BILITOT 1.1  PROT 6.0*  ALBUMIN 3.0*    HbA1C: Hgb A1c MFr Bld  Date/Time Value Ref Range Status  07/15/2017 04:24 AM 5.9 (H) 4.8 - 5.6 % Final    Comment:    (NOTE) Pre diabetes:          5.7%-6.4% Diabetes:              >6.4% Glycemic control for   <7.0% adults with diabetes      Recent Results (from the past 240 hour(s))  MRSA PCR Screening     Status: None   Collection Time: 07/25/17  1:20 PM  Result Value Ref Range Status   MRSA by PCR NEGATIVE NEGATIVE Final    Comment:        The GeneXpert MRSA Assay (FDA approved for NASAL specimens only), is one component of a comprehensive MRSA colonization surveillance program. It is not intended to diagnose MRSA infection nor to guide or monitor treatment for MRSA infections. Performed at [facility], [place] [name] [place], [place] [address]      Scheduled Meds: . amiodarone  400 mg Oral BID  . fenofibrate  160 mg Oral Daily  . latanoprost  1 drop Left Eye QHS  . loratadine  10 mg Oral Daily  . pravastatin  40 mg Oral QHS  . ranolazine  500 mg Oral BID  . tamsulosin  0.4 mg Oral QPM     LOS: 1 day   Cherene Altes, MD [facility] Office  [phone] Pager - Text Page per Shea Evans as per below:  On-Call/Text Page:  [email or web address]      password [email or web address]  If 7PM-7AM, please contact night-coverage [email or web address] Password [email or web address] 07/27/2017, 3:00 PM

## 2017-07-28 ENCOUNTER — Encounter (HOSPITAL_COMMUNITY): Payer: Self-pay

## 2017-07-28 DIAGNOSIS — R279 Unspecified lack of coordination: Secondary | ICD-10-CM | POA: Diagnosis not present

## 2017-07-28 DIAGNOSIS — I2510 Atherosclerotic heart disease of native coronary artery without angina pectoris: Secondary | ICD-10-CM | POA: Diagnosis not present

## 2017-07-28 DIAGNOSIS — S32474A Nondisplaced fracture of medial wall of right acetabulum, initial encounter for closed fracture: Secondary | ICD-10-CM | POA: Diagnosis not present

## 2017-07-28 DIAGNOSIS — N400 Enlarged prostate without lower urinary tract symptoms: Secondary | ICD-10-CM | POA: Diagnosis not present

## 2017-07-28 DIAGNOSIS — W19XXXD Unspecified fall, subsequent encounter: Secondary | ICD-10-CM | POA: Diagnosis not present

## 2017-07-28 DIAGNOSIS — S065X9D Traumatic subdural hemorrhage with loss of consciousness of unspecified duration, subsequent encounter: Secondary | ICD-10-CM | POA: Diagnosis not present

## 2017-07-28 DIAGNOSIS — S32414A Nondisplaced fracture of anterior wall of right acetabulum, initial encounter for closed fracture: Secondary | ICD-10-CM | POA: Diagnosis not present

## 2017-07-28 DIAGNOSIS — I441 Atrioventricular block, second degree: Secondary | ICD-10-CM | POA: Diagnosis not present

## 2017-07-28 DIAGNOSIS — I4891 Unspecified atrial fibrillation: Secondary | ICD-10-CM | POA: Diagnosis not present

## 2017-07-28 DIAGNOSIS — J449 Chronic obstructive pulmonary disease, unspecified: Secondary | ICD-10-CM | POA: Diagnosis not present

## 2017-07-28 DIAGNOSIS — S72001G Fracture of unspecified part of neck of right femur, subsequent encounter for closed fracture with delayed healing: Secondary | ICD-10-CM | POA: Diagnosis not present

## 2017-07-28 DIAGNOSIS — K5900 Constipation, unspecified: Secondary | ICD-10-CM | POA: Diagnosis not present

## 2017-07-28 DIAGNOSIS — H409 Unspecified glaucoma: Secondary | ICD-10-CM | POA: Diagnosis not present

## 2017-07-28 DIAGNOSIS — D649 Anemia, unspecified: Secondary | ICD-10-CM | POA: Diagnosis not present

## 2017-07-28 DIAGNOSIS — R41841 Cognitive communication deficit: Secondary | ICD-10-CM | POA: Diagnosis not present

## 2017-07-28 DIAGNOSIS — L299 Pruritus, unspecified: Secondary | ICD-10-CM | POA: Diagnosis not present

## 2017-07-28 DIAGNOSIS — I70201 Unspecified atherosclerosis of native arteries of extremities, right leg: Secondary | ICD-10-CM | POA: Diagnosis not present

## 2017-07-28 DIAGNOSIS — R609 Edema, unspecified: Secondary | ICD-10-CM | POA: Diagnosis not present

## 2017-07-28 DIAGNOSIS — I480 Paroxysmal atrial fibrillation: Secondary | ICD-10-CM | POA: Diagnosis not present

## 2017-07-28 DIAGNOSIS — S065X0D Traumatic subdural hemorrhage without loss of consciousness, subsequent encounter: Secondary | ICD-10-CM

## 2017-07-28 DIAGNOSIS — R55 Syncope and collapse: Secondary | ICD-10-CM

## 2017-07-28 DIAGNOSIS — R0902 Hypoxemia: Secondary | ICD-10-CM | POA: Diagnosis not present

## 2017-07-28 DIAGNOSIS — T148XXD Other injury of unspecified body region, subsequent encounter: Secondary | ICD-10-CM | POA: Diagnosis not present

## 2017-07-28 DIAGNOSIS — M25571 Pain in right ankle and joints of right foot: Secondary | ICD-10-CM | POA: Diagnosis not present

## 2017-07-28 DIAGNOSIS — Z8673 Personal history of transient ischemic attack (TIA), and cerebral infarction without residual deficits: Secondary | ICD-10-CM | POA: Diagnosis not present

## 2017-07-28 DIAGNOSIS — Z951 Presence of aortocoronary bypass graft: Secondary | ICD-10-CM | POA: Diagnosis not present

## 2017-07-28 DIAGNOSIS — S0990XD Unspecified injury of head, subsequent encounter: Secondary | ICD-10-CM | POA: Diagnosis not present

## 2017-07-28 DIAGNOSIS — S7001XD Contusion of right hip, subsequent encounter: Secondary | ICD-10-CM

## 2017-07-28 DIAGNOSIS — N183 Chronic kidney disease, stage 3 (moderate): Secondary | ICD-10-CM | POA: Diagnosis not present

## 2017-07-28 DIAGNOSIS — I5022 Chronic systolic (congestive) heart failure: Secondary | ICD-10-CM | POA: Diagnosis not present

## 2017-07-28 DIAGNOSIS — I6509 Occlusion and stenosis of unspecified vertebral artery: Secondary | ICD-10-CM | POA: Diagnosis not present

## 2017-07-28 DIAGNOSIS — I739 Peripheral vascular disease, unspecified: Secondary | ICD-10-CM | POA: Diagnosis not present

## 2017-07-28 DIAGNOSIS — M6281 Muscle weakness (generalized): Secondary | ICD-10-CM | POA: Diagnosis not present

## 2017-07-28 DIAGNOSIS — I25118 Atherosclerotic heart disease of native coronary artery with other forms of angina pectoris: Secondary | ICD-10-CM | POA: Diagnosis not present

## 2017-07-28 DIAGNOSIS — I482 Chronic atrial fibrillation: Secondary | ICD-10-CM | POA: Diagnosis not present

## 2017-07-28 DIAGNOSIS — Z111 Encounter for screening for respiratory tuberculosis: Secondary | ICD-10-CM | POA: Diagnosis not present

## 2017-07-28 DIAGNOSIS — C679 Malignant neoplasm of bladder, unspecified: Secondary | ICD-10-CM | POA: Diagnosis not present

## 2017-07-28 DIAGNOSIS — R52 Pain, unspecified: Secondary | ICD-10-CM | POA: Diagnosis not present

## 2017-07-28 DIAGNOSIS — R262 Difficulty in walking, not elsewhere classified: Secondary | ICD-10-CM | POA: Diagnosis not present

## 2017-07-28 DIAGNOSIS — R42 Dizziness and giddiness: Secondary | ICD-10-CM | POA: Diagnosis not present

## 2017-07-28 DIAGNOSIS — E785 Hyperlipidemia, unspecified: Secondary | ICD-10-CM | POA: Diagnosis not present

## 2017-07-28 DIAGNOSIS — J45998 Other asthma: Secondary | ICD-10-CM | POA: Diagnosis not present

## 2017-07-28 DIAGNOSIS — H401190 Primary open-angle glaucoma, unspecified eye, stage unspecified: Secondary | ICD-10-CM | POA: Diagnosis not present

## 2017-07-28 DIAGNOSIS — Z743 Need for continuous supervision: Secondary | ICD-10-CM | POA: Diagnosis not present

## 2017-07-28 DIAGNOSIS — Z79899 Other long term (current) drug therapy: Secondary | ICD-10-CM | POA: Diagnosis not present

## 2017-07-28 DIAGNOSIS — I509 Heart failure, unspecified: Secondary | ICD-10-CM | POA: Diagnosis not present

## 2017-07-28 DIAGNOSIS — Z9181 History of falling: Secondary | ICD-10-CM | POA: Diagnosis not present

## 2017-07-28 LAB — BASIC METABOLIC PANEL
Anion gap: 10 (ref 5–15)
BUN: 17 mg/dL (ref 6–20)
CALCIUM: 8.8 mg/dL — AB (ref 8.9–10.3)
CO2: 28 mmol/L (ref 22–32)
Chloride: 97 mmol/L — ABNORMAL LOW (ref 101–111)
Creatinine, Ser: 0.98 mg/dL (ref 0.61–1.24)
GFR calc Af Amer: >60 mL/min (ref >60)
Glucose, Bld: 97 mg/dL (ref 65–99)
Potassium: 3.8 mmol/L (ref 3.5–5.1)
SODIUM: 135 mmol/L (ref 135–145)

## 2017-07-28 LAB — CBC
HCT: 39.1 % (ref 39.0–52.0)
Hemoglobin: 13.4 g/dL (ref 13.0–17.0)
MCH: 31.1 pg (ref 26.0–34.0)
MCHC: 34.3 g/dL (ref 30.0–36.0)
MCV: 90.7 fL (ref 78.0–100.0)
PLATELETS: 203 10*3/uL (ref 150–400)
RBC: 4.31 MIL/uL (ref 4.22–5.81)
RDW: 16.1 % — AB (ref 11.5–15.5)
WBC: 5.6 10*3/uL (ref 4.0–10.5)

## 2017-07-28 MED ORDER — OXYCODONE HCL 5 MG PO TABS: 5.0000 mg | ORAL_TABLET | ORAL | 0 refills | Status: DC | PRN

## 2017-07-28 MED ORDER — BISACODYL 10 MG RE SUPP
10.0000 mg | Freq: Once | RECTAL | Status: AC
  Administered 2017-07-28: 10 mg via RECTAL
  Filled 2017-07-28: qty 1

## 2017-07-28 MED ORDER — MAGNESIUM CITRATE PO SOLN
1.0000 | Freq: Once | ORAL | Status: AC
  Administered 2017-07-28: 1 via ORAL
  Filled 2017-07-28: qty 296

## 2017-07-28 MED ORDER — FUROSEMIDE 40 MG PO TABS: 40.0000 mg | ORAL_TABLET | Freq: Every day | ORAL | 5 refills | Status: DC | PRN

## 2017-07-28 NOTE — Progress Notes (Signed)
Physical Therapy Treatment Patient Details Name: Donald [name] MRN: [id number] DOB: 1926/07/12 Today's Date: 07/28/2017    History of Present Illness 84 yo M w/ a hx of  Afib, CAD, CHF (EF 40-45%), CVA, asthma, and prostate cancer who presented after a fall.  Dx of SDH, R hip pain (imaging negative for fracture). Patient was hospitalized 4/16 > 4/25 for persistent atrial fibrillation, CHF, and AKI.      PT Comments    Patient agreeable to participate in therapy. Activity tolerance limited by R hip pain. Pt was able to ambulate 15ft with RW and min/mod A. Pt c/o dizziness in standing that subsided when sitting. BP 141/92 in sitting after ambulating short distance. Continue to progress as tolerated with anticipated d/c to SNF for further skilled PT services.    Follow Up Recommendations  SNF;Supervision for mobility/OOB     Equipment Recommendations  3in1 (PT)    Recommendations for Other Services       Precautions / Restrictions Precautions Precautions: Fall Precaution Comments: fall with syncopal episode on day of admission Restrictions Weight Bearing Restrictions: No    Mobility  Bed Mobility               General bed mobility comments: pt OOB in chair upon arrival  Transfers Overall transfer level: Needs assistance Equipment used: Rolling walker (2 wheeled) Transfers: Sit to/from Stand Sit to Stand: Min guard         General transfer comment: cues for safe hand placement and min guard for safety  Ambulation/Gait Ambulation/Gait assistance: Min assist;Mod assist Ambulation Distance (Feet): 12 Feet Assistive device: Rolling walker (2 wheeled) Gait Pattern/deviations: Step-to pattern;Decreased stance time - right;Decreased step length - left;Decreased weight shift to right;Shuffle;Antalgic Gait velocity: decr   General Gait Details: cues for sequencing; limited distance due to increasing R hip pain; pt with LOB requiring assist to recover when  turning   Stairs             Wheelchair Mobility    Modified Rankin (Stroke Patients Only)       Balance Overall balance assessment: Needs assistance;History of Falls   Sitting balance-Leahy Scale: Good     Standing balance support: Bilateral upper extremity supported;Single extremity supported;During functional activity Standing balance-Leahy Scale: Poor                              Cognition Arousal/Alertness: Awake/alert Behavior During Therapy: WFL for tasks assessed/performed Overall Cognitive Status: Within Functional Limits for tasks assessed                                        Exercises General Exercises - Lower Extremity Long Arc Quad: AROM;Seated;Both Hip Flexion/Marching: AROM;Both;Seated(limited reps and ROM R hip) Toe Raises: AROM;Both Heel Raises: AROM;Both    General Comments        Pertinent Vitals/Pain Pain Assessment: Faces Faces Pain Scale: Hurts whole lot Pain Location: R hip with weightbearing Pain Descriptors / Indicators: Guarding;Grimacing;Moaning Pain Intervention(s): Limited activity within patient's tolerance;Monitored during session;Repositioned;Ice applied    Home Living                      Prior Function            PT Goals (current goals can now be found in the care plan section) Acute Rehab PT  Goals Patient Stated Goal: return to independence PT Goal Formulation: With patient/family Time For Goal Achievement: 08/09/17 Potential to Achieve Goals: Good Progress towards PT goals: Progressing toward goals    Frequency    Min 3X/week      PT Plan Current plan remains appropriate    Co-evaluation              AM-PAC PT "6 Clicks" Daily Activity  Outcome Measure  Difficulty turning over in bed (including adjusting bedclothes, sheets and blankets)?: A Little Difficulty moving from lying on back to sitting on the side of the bed? : Unable Difficulty sitting down on  and standing up from a chair with arms (e.g., wheelchair, bedside commode, etc,.)?: Unable Help needed moving to and from a bed to chair (including a wheelchair)?: A Little Help needed walking in hospital room?: A Lot Help needed climbing 3-5 steps with a railing? : Total 6 Click Score: 11    End of Session Equipment Utilized During Treatment: Gait belt Activity Tolerance: Patient limited by pain Patient left: in chair;with call bell/phone within reach Nurse Communication: Mobility status PT Visit Diagnosis: Unsteadiness on feet (R26.81);History of falling (Z91.81);Difficulty in walking, not elsewhere classified (R26.2);Pain Pain - Right/Left: Right Pain - part of body: Hip     Time: 0937-1000 PT Time Calculation (min) (ACUTE ONLY): 23 min  Charges:  $Gait Training: 8-22 mins $Therapeutic Exercise: 8-22 mins                    G Codes:       Earney Navy, PTA Pager: [phone]     Darliss Cheney 07/28/2017, 10:09 AM

## 2017-07-28 NOTE — Clinical Social Work Placement (Signed)
Nurse to call report to [phone]    CLINICAL SOCIAL WORK PLACEMENT  NOTE  Date:  07/28/2017  Patient Details  Name: Donald [name] MRN: [id number] Date of Birth: 1927-02-17  Clinical Social Work is seeking post-discharge placement for this patient at the [place] level of care (*CSW will initial, date and re-position this form in  chart as items are completed):  Yes   Patient/family provided with [place] Work Department's list of facilities offering this level of care within the geographic area requested by the patient (or if unable, by the patient's family).  Yes   Patient/family informed of their freedom to choose among providers that offer the needed level of care, that participate in Medicare, Medicaid or managed care program needed by the patient, have an available bed and are willing to accept the patient.  Yes   Patient/family informed of [place]'s ownership interest in [facility] and [facility], as well as of the fact that they are under no obligation to receive care at these facilities.  PASRR submitted to EDS on       PASRR number received on 07/27/17     Existing PASRR number confirmed on       FL2 transmitted to all facilities in geographic area requested by pt/family on 07/27/17     FL2 transmitted to all facilities within larger geographic area on       Patient informed that his/her managed care company has contracts with or will negotiate with certain facilities, including the following:        Yes   Patient/family informed of bed offers received.  Patient chooses bed at [facility]     Physician recommends and patient chooses bed at      Patient to be transferred to [facility] on 07/28/17.  Patient to be transferred to facility by [facility]     Patient family notified on 07/28/17 of transfer.  Name of family member notified:        PHYSICIAN       Additional Comment:     _______________________________________________ Geralynn Ochs, LCSW 07/28/2017, 10:47 AM

## 2017-07-28 NOTE — Discharge Planning (Signed)
Patient's IV and tele removed RN assessment and VS revealed stability for DC to [facility] returned voicemail s/w Durwin Glaze, RN.  Discharge papers given, explained, signed and placed in packet with signed [facility] [place] DNR.  Signed scripts in packet.  Informed of suggested FU appts and facility will set up to match transportation availability. BM completed prior to DC.  [facility] called to transport to room 611B - waiting on arrival.

## 2017-07-28 NOTE — Care Management Note (Signed)
Case Management Note  Patient Details  Name: RIVALDO HINEMAN MRN: [id number] Date of Birth: 1927/02/20  Subjective/Objective:      Pt in with traumatic subdural hematoma from a fall. He is from home alone.              Action/Plan: Pt is discharging to [facility] today. CM signing off.   Expected Discharge Date:  07/28/17               Expected Discharge Plan:  Skilled Nursing Facility  In-House Referral:  Clinical Social Work  Discharge planning Services     Post Acute Care Choice:    Choice offered to:     DME Arranged:    DME Agency:     HH Arranged:    [place] Agency:     Status of Service:  Completed, signed off  If discussed at [employer], dates discussed:    Additional Comments:  Pollie Friar, RN 07/28/2017, 10:16 AM

## 2017-07-28 NOTE — Discharge Planning (Signed)
Left message on RN supervisor's voicemail (at [facility]) with return spec # to receive report.

## 2017-07-28 NOTE — Clinical Social Work Note (Signed)
Clinical Social Work Assessment  Patient Details  Name: Donald [name] MRN: [id number] Date of Birth: 1926/10/28  Date of referral:  07/27/17               Reason for consult:  Facility Placement                Permission sought to share information with:  Facility Sport and exercise psychologist, Family Supports Permission granted to share information::  Yes, Verbal Permission Granted  Name::     Diplomatic Services operational officer::  SNF  Relationship::  Administrator Information:     Housing/Transportation Living arrangements for the past 2 months:  Single Family Home Source of Information:  Patient, Other (Comment Required) Patient Interpreter Needed:  None Criminal Activity/Legal Involvement Pertinent to Current Situation/Hospitalization:  No - Comment as needed Significant Relationships:  Other Family Members, Adult Children Lives with:  Self Do you feel safe going back to the place where you live?  Yes Need for family participation in patient care:  No (Coment)  Care giving concerns:  CSW received consult for possible SNF placement at time of discharge. CSW spoke with patient regarding PT recommendation of SNF placement at time of discharge. Patient reported that given patient's current physical needs and fall risk, he would like SNF. CSW to continue to follow and assist with discharge planning needs.   Social Worker assessment / plan:  CSW spoke with patient concerning possibility of rehab at [facility] before returning home.  Employment status:  Retired Forensic scientist:  Medicare PT Recommendations:  [place] / Referral to community resources:  [place]  Patient/Family's Response to care:  Patient recognizes need for rehab before returning home and is agreeable to a SNF in [place]. Patient reported preference for [facility].  Patient/Family's Understanding of and Emotional Response to Diagnosis, Current Treatment, and Prognosis:   Patient/family is realistic regarding therapy needs and expressed being hopeful for SNF placement. Patient expressed understanding of CSW role and discharge process as well as medical condition. No questions/concerns about plan or treatment.    Emotional Assessment Appearance:  Appears stated age Attitude/Demeanor/Rapport:  Engaged Affect (typically observed):  Pleasant Orientation:  Oriented to Self, Oriented to Place, Oriented to  Time, Oriented to Situation Alcohol / Substance use:  Not Applicable Psych involvement (Current and /or in the community):  No (Comment)  Discharge Needs  Concerns to be addressed:  Care Coordination Readmission within the last 30 days:  Yes Current discharge risk:  Dependent with Mobility, Lives alone Barriers to Discharge:  Continued Medical Work up   [employer], [place] 07/28/2017, 12:26 PM

## 2017-07-28 NOTE — Discharge Planning (Signed)
Patient needs to have BM prior to discharge. Have given Miralax, Senna, Mag Citrate and Dulcolax supp.  Patient I having gas - but still waiting for BM to Discharge.

## 2017-07-28 NOTE — Progress Notes (Signed)
Patient will DC to: [facility] Anticipated DC date: 07/28/17 Family notified: Patient notifying family Transport by: Corey Harold   Per MD patient ready for DC to [facility]. RN, patient, patient's family, and facility notified of DC. Discharge Summary sent to facility. RN given number for report [phone]). DC packet on chart. Ambulance transport requested for patient.   CSW signing off.  Cedric Fishman, LCSW Clinical Social Worker [phone]

## 2017-07-28 NOTE — Discharge Summary (Signed)
Physician Discharge Summary  Donald [name] QAS:[id number] DOB: 1926-07-17 DOA: 07/24/2017  PCP: Haywood Pao, MD  Admit date: 07/24/2017 Discharge date: 07/28/2017  Admitted From: Home Disposition:  SNF  Recommendations for Outpatient Follow-up:  1. Follow up with PCP in 1-2 weeks 2. Follow up with Dr. [name] with cardiology in 2 weeks, consider resumption of anticoagulation then and decreasing Amiodarone dosing   Discharge Condition: stable CODE STATUS: DNR Diet recommendation: heart healthy  HPI: Per Dr. Tamala Julian, Donald [name] is a 84 y.o. male with medical history significant of  Afib, CAD, CHF last EF 40-45%, CVA, asthma, and h/o prostate cancer; who presents after having a fall.  Patient had just been hospitalized from 4/16 through 4/25 for persistent atrial fibrillation, combined CHF, and AKI.  During that hospitalization patient underwent DCCV on 4/18 and 4/23 now in sinus rhythm.  Medication changes included discontinuation of bisoprolol due to low blood pressures, increase in amiodarone from 200 to 400 mg per day, and increase in Lasix from 40 mg daily to 40 mg twice daily.  Patient reports feeling lightheaded with standing.  Today he had just gotten up and was walking the nursing facility where he was visiting friends when he felt lightheaded.  He tried holding onto the object around him, but subsequently was noted to lose consciousness. Patient landed striking the right side of his forehead and right hip.  He feels like he woke up within seconds of losing passing.  He complainedof pain is where he hit the floor and his right hip with movement.  He is on Eliquis has been taking this as recommended.  Denies having any significant chest pain, palpitations, lower extremity swelling, fever, chills, nausea, vomiting, diarrhea, dysuria, or change in weight.  Patient reports being up-to-date on his tetanus shot. ED Course: Upon admission to the emergency department patient was noted  to have pulse 79-83, respiration 12-18, blood pressure 108/72-142/93, and O2 saturation 92 to 96% on room air.  Initially only a BMP was ordered significant for BUN 27 and creatinine 1.45.  CT imaging of the brain showed an acute small 4 mm subdural hematoma. X-rays of right hip were negative for acute fracture.  Dr. Arnoldo Morale of neurosurgery was consulted who recommended close monitoring overnight with repeat CT w/o stress the brain for any acute changes overnight.  Reversal of anticoagulation was not recommended at this time.   Hospital Course: Traumatic subdural hematoma secondary to fall Neurosurgery has completed an evaluation - stable on f/u CT head 4/28 - no focal neuro deficits again today - plan to hold anticoag for 2 weeks, and f/u w/ his Cardiologist at the end of that 2 weeks to determine appropriateness of resuming anticoag Syncope and collapse Hx most c/w orthostatic hypotension in the setting of volume depletion - gently hydrated - clinically appears euvolemic at this time - not orthostatic at this time. Change Lasix to prn as per cardiology recommendations Right hip pain / hematoma of scalp / skin tears Symptomatic treatment- cont PT/OT - will need a SNF rehab stay  Paroxysmal atrial fibrillation CHA2DS2-VASc is 4 - holding anticoag - rate controlled - plan to hold anticoag for 2 weeks, and f/u w/ his Cardiologist at the end of that 2 weeks to determine appropriateness of resuming anticoag Chronic Systolic CHF EF 79-89% by TTE 06/13/2017 - dry weight is reportedly around 173 pounds but pt was reportedly 172# at admit when he was orthostatic - holding diuretic for now - plan to institute  weight based diuretic dosing instead of scheduled dosing at time of d/c, w/ goal weight being ~176# (this may need to be adjusted over time based on volume status/symptoms). Recommend daily weights Asthma/COPD Continue Breo - well compensated  Hyperlipidemia Continue pravastatin  AKI on Chronic kidney  disease stage III crt much improved w/ hydration - stable now BPH Continue Flomax  Discharge Diagnoses:  Principal Problem:   Traumatic subdural hematoma (HCC) Active Problems:   PAF (paroxysmal atrial fibrillation) (HCC)   Chronic systolic CHF (congestive heart failure) (HCC)   Syncope   Subdural hematoma (HCC)   Contusion of right hip   Discharge Instructions   Allergies as of 07/28/2017      Reactions   Keflex [cephalexin] Nausea And Vomiting, Other (See Comments)   'Killed all GI enzymes" (also) and patient prefers to not take this   Morphine Nausea Only      Medication List    STOP taking these medications   apixaban 2.5 MG Tabs tablet Commonly known as:  ELIQUIS   Potassium Chloride ER 20 MEQ Tbcr     TAKE these medications   amiodarone 200 MG tablet Commonly known as:  PACERONE Take 2 tablets (400 mg total) by mouth 2 (two) times daily.   BREO ELLIPTA 100-25 MCG/INH Aepb Generic drug:  fluticasone furoate-vilanterol Inhale 1 puff into the lungs daily as needed (shortness of breath).   fenofibrate micronized 200 MG capsule Commonly known as:  LOFIBRA Take 1 capsule (200 mg total) by mouth daily.   FLOMAX 0.4 MG Caps capsule Generic drug:  tamsulosin Take 0.4 mg by mouth every evening.   furosemide 40 MG tablet Commonly known as:  LASIX Take 1 tablet (40 mg total) by mouth daily as needed for fluid or edema. What changed:    when to take this  reasons to take this   hydrocortisone cream 1 % Apply 1 application topically 3 (three) times daily as needed for itching (skin irritation).   latanoprost 0.005 % ophthalmic solution Commonly known as:  XALATAN Place 1 drop into the left eye at bedtime.   loratadine 10 MG tablet Commonly known as:  CLARITIN Take 10 mg by mouth daily.   multivitamin with minerals Tabs tablet Take 1 tablet by mouth daily.   oxyCODONE 5 MG immediate release tablet Commonly known as:  Oxy IR/ROXICODONE Take 1-2 tablets  (5-10 mg total) by mouth every 4 (four) hours as needed for moderate pain.   pravastatin 40 MG tablet Commonly known as:  PRAVACHOL Take 1 tablet (40 mg total) by mouth at bedtime.   ranolazine 500 MG 12 hr tablet Commonly known as:  RANEXA Take 1 tablet (500 mg total) by mouth 2 (two) times daily.   Vitamin D 2000 units tablet Take 2,000 Units by mouth daily.      Follow-up Information    Lelon Perla, MD. Schedule an appointment as soon as possible for a visit in 2 week(s).   Specialty:  Cardiology Contact information: [address] [place] [address] [phone]          Consultations:  Cardiology   Procedures/Studies  Ct Head Wo Contrast  Result Date: 07/25/2017 CLINICAL DATA:  Follow-up examination for subdural hemorrhage. EXAM: CT HEAD WITHOUT CONTRAST TECHNIQUE: Contiguous axial images were obtained from the base of the skull through the vertex without intravenous contrast. COMPARISON:  Prior CT from 07/24/2017. FINDINGS: Brain: Previously identified small right subdural hematoma is stable to slightly decreased in size from  previous, measuring up to 4 mm in maximal thickness, similar to previous. No evidence for interval bleeding or expansion. No new acute intracranial hemorrhage. Stable atrophy with chronic small vessel ischemic disease. Remote left parietal infarct noted. No acute large vessel territory infarct. No mass lesion or midline shift. No hydrocephalus. Vascular: No hyperdense vessel. Calcified atherosclerosis at the skull base. Skull: Evolving right frontal scalp contusion.  Calvarium intact. Sinuses/Orbits: No acute abnormality. Paranasal sinuses are largely clear. No mastoid effusion. Other: None. IMPRESSION: 1. Stable to slightly decreased size of small right subdural hematoma measuring up to 4 mm in maximal thickness. No significant mass effect. 2. No other new acute intracranial abnormality. 3. Evolving right frontal scalp contusion.  Electronically Signed   By: Jeannine Boga M.D.   On: 07/25/2017 06:53   Ct Head Wo Contrast  Result Date: 07/24/2017 CLINICAL DATA:  Weakness and dizziness today. Struck right forehead to the floor with abrasions. Patient on Eliquis. Patient seen last week for atrial fibrillation with cardioversion. EXAM: CT HEAD WITHOUT CONTRAST CT CERVICAL SPINE WITHOUT CONTRAST TECHNIQUE: Multidetector CT imaging of the head and cervical spine was performed following the standard protocol without intravenous contrast. Multiplanar CT image reconstructions of the cervical spine were also generated. COMPARISON:  MRI brain 12/31/2005.  CT head 12/30/2005 FINDINGS: CT HEAD FINDINGS Brain: Diffuse cerebral atrophy. Mild ventricular dilatation consistent with central atrophy. Low-attenuation changes in the deep white matter consistent small vessel ischemia. Focal area of old encephalomalacia in the left parietal convexity. Small acute subdural hematoma on the right frontal region measuring about 4 mm depth. No mass-effect or midline shift. Gray-white matter junctions are distinct. Basal cisterns are not effaced. Vascular: Intracranial arterial vascular calcifications are present. Skull: The calvarium appears intact. No acute depressed skull fractures identified. Sinuses/Orbits: Paranasal sinuses and mastoid air cells are not opacified. There is some sclerosis in the left mastoids. Other: Prominent subcutaneous scalp hematoma over the right anterior frontal region. CT CERVICAL SPINE FINDINGS Alignment: There is straightening of usual cervical lordosis without anterior subluxation, centered in area of degenerative change. This is likely degenerative. Muscle spasm or ligamentous injury can sometimes have this appearance and are not entirely excluded. Normal alignment of the facet joints. C1-2 articulation appears intact. Skull base and vertebrae: Skull base appears intact. No vertebral compression deformities. No focal bone  lesion or bone destruction. Cystic change in the base of the odontoid process is likely a degenerative cyst. Soft tissues and spinal canal: No prevertebral soft tissue swelling. No paraspinal soft tissue mass or infiltration. Disc levels: Degenerative changes throughout the cervical spine with narrowed interspaces and endplate hypertrophic changes. Degenerative changes are most severe at C5-6 and C6-7 levels. Prominent disc osteophyte complex at C5-6 causes some encroachment upon the anterior thecal sac centrally. Degenerative changes throughout the facet joints. Upper chest: Lung apices are clear. Vascular calcifications. Small amount of venous gas likely results from intravenous injections. Other: None. IMPRESSION: 1. Small acute subdural hematoma in the right frontal region measuring about 4 mm depth. No mass-effect or midline shift. 2. Chronic cerebral atrophy and small vessel ischemic changes. 3. Straightening of the usual cervical lordosis is likely degenerative. Diffuse degenerative change throughout the cervical spine. No acute displaced fractures identified. These results were called by telephone at the time of interpretation on 07/24/2017 at 9:28 pm to Dr. Fredia Sorrow , who verbally acknowledged these results. Electronically Signed   By: Lucienne Capers M.D.   On: 07/24/2017 21:29   Ct Cervical Spine Wo Contrast  Result Date: 07/24/2017 CLINICAL DATA:  Weakness and dizziness today. Struck right forehead to the floor with abrasions. Patient on Eliquis. Patient seen last week for atrial fibrillation with cardioversion. EXAM: CT HEAD WITHOUT CONTRAST CT CERVICAL SPINE WITHOUT CONTRAST TECHNIQUE: Multidetector CT imaging of the head and cervical spine was performed following the standard protocol without intravenous contrast. Multiplanar CT image reconstructions of the cervical spine were also generated. COMPARISON:  MRI brain 12/31/2005.  CT head 12/30/2005 FINDINGS: CT HEAD FINDINGS Brain: Diffuse  cerebral atrophy. Mild ventricular dilatation consistent with central atrophy. Low-attenuation changes in the deep white matter consistent small vessel ischemia. Focal area of old encephalomalacia in the left parietal convexity. Small acute subdural hematoma on the right frontal region measuring about 4 mm depth. No mass-effect or midline shift. Gray-white matter junctions are distinct. Basal cisterns are not effaced. Vascular: Intracranial arterial vascular calcifications are present. Skull: The calvarium appears intact. No acute depressed skull fractures identified. Sinuses/Orbits: Paranasal sinuses and mastoid air cells are not opacified. There is some sclerosis in the left mastoids. Other: Prominent subcutaneous scalp hematoma over the right anterior frontal region. CT CERVICAL SPINE FINDINGS Alignment: There is straightening of usual cervical lordosis without anterior subluxation, centered in area of degenerative change. This is likely degenerative. Muscle spasm or ligamentous injury can sometimes have this appearance and are not entirely excluded. Normal alignment of the facet joints. C1-2 articulation appears intact. Skull base and vertebrae: Skull base appears intact. No vertebral compression deformities. No focal bone lesion or bone destruction. Cystic change in the base of the odontoid process is likely a degenerative cyst. Soft tissues and spinal canal: No prevertebral soft tissue swelling. No paraspinal soft tissue mass or infiltration. Disc levels: Degenerative changes throughout the cervical spine with narrowed interspaces and endplate hypertrophic changes. Degenerative changes are most severe at C5-6 and C6-7 levels. Prominent disc osteophyte complex at C5-6 causes some encroachment upon the anterior thecal sac centrally. Degenerative changes throughout the facet joints. Upper chest: Lung apices are clear. Vascular calcifications. Small amount of venous gas likely results from intravenous injections.  Other: None. IMPRESSION: 1. Small acute subdural hematoma in the right frontal region measuring about 4 mm depth. No mass-effect or midline shift. 2. Chronic cerebral atrophy and small vessel ischemic changes. 3. Straightening of the usual cervical lordosis is likely degenerative. Diffuse degenerative change throughout the cervical spine. No acute displaced fractures identified. These results were called by telephone at the time of interpretation on 07/24/2017 at 9:28 pm to Dr. Fredia Sorrow , who verbally acknowledged these results. Electronically Signed   By: Lucienne Capers M.D.   On: 07/24/2017 21:29   US Renal  Result Date: 07/16/2017 CLINICAL DATA:  Fluid overload.  Renal insufficiency. EXAM: RENAL / URINARY TRACT ULTRASOUND COMPLETE COMPARISON:  03/12/2017 CT FINDINGS: Right Kidney: Length: 11.0 cm. Normal renal echogenicity and cortical thickness for age. No hydronephrosis. Lower pole 1.6 cm right renal cyst. Left Kidney: Length: 9.8 cm. Suspect mild left renal atrophy. Normal echogenicity. No hydronephrosis. Bladder: Appears normal for degree of bladder distention. Note is made of bilateral pleural effusions. IMPRESSION: 1. No hydronephrosis or other explanation for acute renal insufficiency. The left kidney may be mildly atrophic. 2. Bilateral pleural effusions. Electronically Signed   By: Abigail Miyamoto M.D.   On: 07/16/2017 19:57   Dg Chest Port 1 View  Result Date: 07/14/2017 CLINICAL DATA:  Chronic shortness of breath EXAM: PORTABLE CHEST 1 VIEW COMPARISON:  06/10/2017 FINDINGS: Cardiac shadow is at the upper limits  of normal in size. Post surgical changes are noted and stable. No sizable infiltrate or effusion is noted. No bony abnormality is seen. IMPRESSION: No acute abnormality noted. Electronically Signed   By: Inez Catalina M.D.   On: 07/14/2017 12:24   Dg Hip Unilat  With Pelvis 2-3 Views Right  Result Date: 07/24/2017 CLINICAL DATA:  Per eMS: Patient seen and d/c'd last week for  a-fib RVR with cardioversion. Patient taking 400mg /daily of amiodarone. Today patient felt weak and dizzy (several episodes since leaving the hospital), hit right forehead to floor (abrasion, patient on eliquis), c/p right hip pain with no deformity noted EXAM: DG HIP (WITH OR WITHOUT PELVIS) 2-3V RIGHT COMPARISON:  None. FINDINGS: No fracture.  No bone lesion. Hip joints are spaced and aligned. SI joints and symphysis pubis are normally spaced and aligned. There is a soft tissue contusion lateral to the greater trochanter of the right proximal femur. IMPRESSION: 1. No fracture or dislocation. Electronically Signed   By: Lajean Manes M.D.   On: 07/24/2017 21:01      Subjective: Feeling well, no lightheadedness / dizziness, no nausea/vomiting. No chest pain / palpitations  Discharge Exam: Vitals:   07/28/17 0435 07/28/17 0729  BP: 103/70 114/83  Pulse: 80 86  Resp: 20 18  Temp: 97.9 F (36.6 C) 98 F (36.7 C)  SpO2: 97% 96%    General: Pt is alert, awake, not in acute distress Cardiovascular: RRR, S1/S2 +, no rubs, no gallops Respiratory: CTA bilaterally, no wheezing, no rhonchi Abdominal: Soft, NT, ND, bowel sounds + Extremities: no edema, no cyanosis    The results of significant diagnostics from this hospitalization (including imaging, microbiology, ancillary and laboratory) are listed below for reference.     Microbiology: Recent Results (from the past 240 hour(s))  MRSA PCR Screening     Status: None   Collection Time: 07/25/17  1:20 PM  Result Value Ref Range Status   MRSA by PCR NEGATIVE NEGATIVE Final    Comment:        The GeneXpert MRSA Assay (FDA approved for NASAL specimens only), is one component of a comprehensive MRSA colonization surveillance program. It is not intended to diagnose MRSA infection nor to guide or monitor treatment for MRSA infections. Performed at [facility], [place] [address], [place], [place] [address]      Labs: BNP (last 3  results) Recent Labs    06/10/17 2217 07/14/17 0445 07/20/17 0607  BNP 930.7* 1,463.4* 326.7*   Basic Metabolic Panel: Recent Labs  Lab 07/22/17 0407 07/25/17 0615 07/26/17 0220 07/27/17 0439  NA 136 135 135 135  K 3.7 3.9 3.7 4.1  CL 99* 98* 101 96*  CO2 27 26 28 30   GLUCOSE 98 110* 142* 113*  BUN 27* 24* 20 16  CREATININE 1.45* 1.21 1.16 1.16  CALCIUM 8.9 9.0 8.7* 9.0   Liver Function Tests: Recent Labs  Lab 07/26/17 0220  AST 34  ALT 27  ALKPHOS 29*  BILITOT 1.1  PROT 6.0*  ALBUMIN 3.0*   No results for input(s): LIPASE, AMYLASE in the last 168 hours. No results for input(s): AMMONIA in the last 168 hours. CBC: Recent Labs  Lab 07/25/17 0055 07/26/17 0220 07/27/17 0439  WBC 7.3 5.9 6.3  NEUTROABS 6.3  --   --   HGB 13.6 13.1 12.7*  HCT 40.3 39.7 38.8*  MCV 90.0 91.1 91.7  PLT 194 182 178   Cardiac Enzymes: No results for input(s): CKTOTAL, CKMB, CKMBINDEX,  TROPONINI in the last 168 hours. BNP: Invalid input(s): POCBNP CBG: No results for input(s): GLUCAP in the last 168 hours. D-Dimer No results for input(s): DDIMER in the last 72 hours. Hgb A1c No results for input(s): HGBA1C in the last 72 hours. Lipid Profile No results for input(s): CHOL, HDL, LDLCALC, TRIG, CHOLHDL, LDLDIRECT in the last 72 hours. Thyroid function studies No results for input(s): TSH, T4TOTAL, T3FREE, THYROIDAB in the last 72 hours.  Invalid input(s): FREET3 Anemia work up No results for input(s): VITAMINB12, FOLATE, FERRITIN, TIBC, IRON, RETICCTPCT in the last 72 hours. Urinalysis No results found for: COLORURINE, APPEARANCEUR, [place], [place], GLUCOSEU, HGBUR, BILIRUBINUR, KETONESUR, PROTEINUR, UROBILINOGEN, NITRITE, LEUKOCYTESUR Sepsis Labs Invalid input(s): PROCALCITONIN,  WBC,  LACTICIDVEN   Time coordinating discharge: 40 minutes  SIGNED:  Marzetta Board, MD  [facility] 07/28/2017, 9:11 AM Pager [phone]  If 7PM-7AM, please contact  night-coverage [email or web address] Password [email or web address]

## 2017-07-29 ENCOUNTER — Ambulatory Visit (HOSPITAL_COMMUNITY): Payer: Medicare Other | Admitting: Nurse Practitioner

## 2017-07-29 ENCOUNTER — Ambulatory Visit: Payer: Medicare Other | Admitting: Adult Health

## 2017-07-29 ENCOUNTER — Other Ambulatory Visit: Payer: Self-pay | Admitting: *Deleted

## 2017-07-29 DIAGNOSIS — J45998 Other asthma: Secondary | ICD-10-CM | POA: Diagnosis not present

## 2017-07-29 DIAGNOSIS — M6281 Muscle weakness (generalized): Secondary | ICD-10-CM | POA: Diagnosis not present

## 2017-07-29 DIAGNOSIS — D649 Anemia, unspecified: Secondary | ICD-10-CM | POA: Diagnosis not present

## 2017-07-29 DIAGNOSIS — I5022 Chronic systolic (congestive) heart failure: Secondary | ICD-10-CM | POA: Diagnosis not present

## 2017-07-29 DIAGNOSIS — S7001XD Contusion of right hip, subsequent encounter: Secondary | ICD-10-CM | POA: Diagnosis not present

## 2017-07-29 DIAGNOSIS — S065X9D Traumatic subdural hemorrhage with loss of consciousness of unspecified duration, subsequent encounter: Secondary | ICD-10-CM | POA: Diagnosis not present

## 2017-07-29 DIAGNOSIS — E785 Hyperlipidemia, unspecified: Secondary | ICD-10-CM | POA: Diagnosis not present

## 2017-07-29 DIAGNOSIS — N183 Chronic kidney disease, stage 3 (moderate): Secondary | ICD-10-CM | POA: Diagnosis not present

## 2017-07-29 DIAGNOSIS — R55 Syncope and collapse: Secondary | ICD-10-CM | POA: Diagnosis not present

## 2017-07-29 DIAGNOSIS — I2510 Atherosclerotic heart disease of native coronary artery without angina pectoris: Secondary | ICD-10-CM | POA: Diagnosis not present

## 2017-07-29 DIAGNOSIS — I480 Paroxysmal atrial fibrillation: Secondary | ICD-10-CM | POA: Diagnosis not present

## 2017-07-29 DIAGNOSIS — J449 Chronic obstructive pulmonary disease, unspecified: Secondary | ICD-10-CM | POA: Diagnosis not present

## 2017-07-29 NOTE — Consult Note (Signed)
[facility] Care Management follow up.  Chart reviewed. [name] discharged to [facility] SNF on 07/28/17. Will make referral to [facility] LCSW to follow up while at SNF.  Marthenia Rolling, MSN-Ed, RN,BSN [facility] Liaison [phone]

## 2017-07-30 DIAGNOSIS — N400 Enlarged prostate without lower urinary tract symptoms: Secondary | ICD-10-CM | POA: Diagnosis not present

## 2017-07-30 DIAGNOSIS — S065X9D Traumatic subdural hemorrhage with loss of consciousness of unspecified duration, subsequent encounter: Secondary | ICD-10-CM | POA: Diagnosis not present

## 2017-07-30 DIAGNOSIS — S7001XD Contusion of right hip, subsequent encounter: Secondary | ICD-10-CM | POA: Diagnosis not present

## 2017-07-30 DIAGNOSIS — I5022 Chronic systolic (congestive) heart failure: Secondary | ICD-10-CM | POA: Diagnosis not present

## 2017-07-30 DIAGNOSIS — N183 Chronic kidney disease, stage 3 (moderate): Secondary | ICD-10-CM | POA: Diagnosis not present

## 2017-07-30 DIAGNOSIS — E785 Hyperlipidemia, unspecified: Secondary | ICD-10-CM | POA: Diagnosis not present

## 2017-07-30 DIAGNOSIS — I480 Paroxysmal atrial fibrillation: Secondary | ICD-10-CM | POA: Diagnosis not present

## 2017-07-30 DIAGNOSIS — I2510 Atherosclerotic heart disease of native coronary artery without angina pectoris: Secondary | ICD-10-CM | POA: Diagnosis not present

## 2017-07-30 DIAGNOSIS — M6281 Muscle weakness (generalized): Secondary | ICD-10-CM | POA: Diagnosis not present

## 2017-07-30 DIAGNOSIS — H401190 Primary open-angle glaucoma, unspecified eye, stage unspecified: Secondary | ICD-10-CM | POA: Diagnosis not present

## 2017-07-30 DIAGNOSIS — J449 Chronic obstructive pulmonary disease, unspecified: Secondary | ICD-10-CM | POA: Diagnosis not present

## 2017-07-30 DIAGNOSIS — R55 Syncope and collapse: Secondary | ICD-10-CM | POA: Diagnosis not present

## 2017-08-02 ENCOUNTER — Other Ambulatory Visit: Payer: Self-pay | Admitting: *Deleted

## 2017-08-02 DIAGNOSIS — R55 Syncope and collapse: Secondary | ICD-10-CM | POA: Diagnosis not present

## 2017-08-02 DIAGNOSIS — S065X9D Traumatic subdural hemorrhage with loss of consciousness of unspecified duration, subsequent encounter: Secondary | ICD-10-CM | POA: Diagnosis not present

## 2017-08-02 DIAGNOSIS — I480 Paroxysmal atrial fibrillation: Secondary | ICD-10-CM | POA: Diagnosis not present

## 2017-08-02 DIAGNOSIS — N183 Chronic kidney disease, stage 3 (moderate): Secondary | ICD-10-CM | POA: Diagnosis not present

## 2017-08-02 DIAGNOSIS — N400 Enlarged prostate without lower urinary tract symptoms: Secondary | ICD-10-CM | POA: Diagnosis not present

## 2017-08-02 DIAGNOSIS — M6281 Muscle weakness (generalized): Secondary | ICD-10-CM | POA: Diagnosis not present

## 2017-08-02 DIAGNOSIS — K5900 Constipation, unspecified: Secondary | ICD-10-CM | POA: Diagnosis not present

## 2017-08-02 DIAGNOSIS — I2510 Atherosclerotic heart disease of native coronary artery without angina pectoris: Secondary | ICD-10-CM | POA: Diagnosis not present

## 2017-08-02 DIAGNOSIS — E785 Hyperlipidemia, unspecified: Secondary | ICD-10-CM | POA: Diagnosis not present

## 2017-08-02 DIAGNOSIS — I5022 Chronic systolic (congestive) heart failure: Secondary | ICD-10-CM | POA: Diagnosis not present

## 2017-08-02 DIAGNOSIS — J449 Chronic obstructive pulmonary disease, unspecified: Secondary | ICD-10-CM | POA: Diagnosis not present

## 2017-08-02 DIAGNOSIS — S7001XD Contusion of right hip, subsequent encounter: Secondary | ICD-10-CM | POA: Diagnosis not present

## 2017-08-02 NOTE — Patient Outreach (Signed)
[facility]) Care Management  08/02/2017  Donald [name] 08-23-1926 [id number]   Phone call to [facility] to follow up on patient's progress and tentative  discharge plans. Voicemail message requesting a return call.   Sheralyn Boatman [facility] Care Management [phone]

## 2017-08-03 ENCOUNTER — Other Ambulatory Visit: Payer: Self-pay | Admitting: Licensed Clinical Social Worker

## 2017-08-03 NOTE — Patient Outreach (Signed)
[facility]) Care Management  08/03/2017  Donald [name] 1926-04-11 [id number]  [facility] CSW arrived at [facility] SNF on 08/03/17 to complete [facility] Consult. Patient was in his room eating lunch when [facility] CSW arrived and patient was agreeable to visit today. Patient has already signed consent form and agreed to [place]. HIPPA verifications provided successfully. Patient reports that he lives alone and that his plan is to eventually return home but he does not know when that time will come and he admits he has not made much progress with PT. Patient reports that his spouse passed away almost 2 years ago. Patient shares that he for the most part has been very independent before this recent fall on the floor and was able to take his medications himself without issues, get groceries, prepare meals and provide transportation for himself. Patient shares that he does not have Mobile Meals and does not want service. Patient states that he is not able to bear weight on his right leg but is hopeful to do this within the next few weeks. Patient shares that one of his daughters resides her at [facility] and he was visiting with her when he had his fall. Patient shares that he has support through his daughter that lives local and his church community. He reports that one of his church members is on the way to visit him now. Patient shares that he has all the medical equipment he needs from his spouse when she was sick. Patient has agreed to be followed by [facility] CSW during his stay at [facility] and is agreeable to [place] referral post SNF discharge back home. [facility] CSW unable to meet with SNF social worker as she was not in her office when [facility] CSW stopped by before and after meeting with patient. [facility] CSW will await for return call from SNF social worker.   [facility] CSW will continue to follow patient while at [facility] and provide socialw ork assistance as  needed.  [name], BSW, MSW, [email or web address] Phone: [phone] Fax: [phone]

## 2017-08-04 DIAGNOSIS — K5900 Constipation, unspecified: Secondary | ICD-10-CM | POA: Diagnosis not present

## 2017-08-04 DIAGNOSIS — I480 Paroxysmal atrial fibrillation: Secondary | ICD-10-CM | POA: Diagnosis not present

## 2017-08-04 DIAGNOSIS — N183 Chronic kidney disease, stage 3 (moderate): Secondary | ICD-10-CM | POA: Diagnosis not present

## 2017-08-04 DIAGNOSIS — E785 Hyperlipidemia, unspecified: Secondary | ICD-10-CM | POA: Diagnosis not present

## 2017-08-04 DIAGNOSIS — R55 Syncope and collapse: Secondary | ICD-10-CM | POA: Diagnosis not present

## 2017-08-04 DIAGNOSIS — S065X9D Traumatic subdural hemorrhage with loss of consciousness of unspecified duration, subsequent encounter: Secondary | ICD-10-CM | POA: Diagnosis not present

## 2017-08-04 DIAGNOSIS — I5022 Chronic systolic (congestive) heart failure: Secondary | ICD-10-CM | POA: Diagnosis not present

## 2017-08-04 DIAGNOSIS — I2510 Atherosclerotic heart disease of native coronary artery without angina pectoris: Secondary | ICD-10-CM | POA: Diagnosis not present

## 2017-08-04 DIAGNOSIS — S7001XD Contusion of right hip, subsequent encounter: Secondary | ICD-10-CM | POA: Diagnosis not present

## 2017-08-04 DIAGNOSIS — M6281 Muscle weakness (generalized): Secondary | ICD-10-CM | POA: Diagnosis not present

## 2017-08-04 DIAGNOSIS — J449 Chronic obstructive pulmonary disease, unspecified: Secondary | ICD-10-CM | POA: Diagnosis not present

## 2017-08-04 DIAGNOSIS — N400 Enlarged prostate without lower urinary tract symptoms: Secondary | ICD-10-CM | POA: Diagnosis not present

## 2017-08-06 DIAGNOSIS — M6281 Muscle weakness (generalized): Secondary | ICD-10-CM | POA: Diagnosis not present

## 2017-08-06 DIAGNOSIS — S7001XD Contusion of right hip, subsequent encounter: Secondary | ICD-10-CM | POA: Diagnosis not present

## 2017-08-06 DIAGNOSIS — S065X9D Traumatic subdural hemorrhage with loss of consciousness of unspecified duration, subsequent encounter: Secondary | ICD-10-CM | POA: Diagnosis not present

## 2017-08-08 ENCOUNTER — Emergency Department (HOSPITAL_COMMUNITY): Payer: Medicare Other

## 2017-08-08 ENCOUNTER — Inpatient Hospital Stay (HOSPITAL_COMMUNITY): Payer: Medicare Other

## 2017-08-08 ENCOUNTER — Observation Stay (HOSPITAL_COMMUNITY)
Admission: EM | Admit: 2017-08-08 | Discharge: 2017-08-09 | Disposition: A | Discharge status: Skilled Nursing Facility | Payer: Medicare Other | Attending: Internal Medicine | Admitting: Internal Medicine

## 2017-08-08 ENCOUNTER — Other Ambulatory Visit: Payer: Self-pay

## 2017-08-08 ENCOUNTER — Emergency Department (HOSPITAL_COMMUNITY)
Admit: 2017-08-08 | Discharge: 2017-08-08 | Disposition: A | Discharge status: Home / Self Care | Payer: Medicare Other | Attending: Emergency Medicine | Admitting: Emergency Medicine

## 2017-08-08 ENCOUNTER — Encounter (HOSPITAL_COMMUNITY): Payer: Self-pay | Admitting: Emergency Medicine

## 2017-08-08 DIAGNOSIS — I4891 Unspecified atrial fibrillation: Secondary | ICD-10-CM

## 2017-08-08 DIAGNOSIS — S065X9D Traumatic subdural hemorrhage with loss of consciousness of unspecified duration, subsequent encounter: Secondary | ICD-10-CM | POA: Diagnosis not present

## 2017-08-08 DIAGNOSIS — Z951 Presence of aortocoronary bypass graft: Secondary | ICD-10-CM | POA: Insufficient documentation

## 2017-08-08 DIAGNOSIS — I5022 Chronic systolic (congestive) heart failure: Secondary | ICD-10-CM | POA: Diagnosis not present

## 2017-08-08 DIAGNOSIS — S32414A Nondisplaced fracture of anterior wall of right acetabulum, initial encounter for closed fracture: Secondary | ICD-10-CM | POA: Diagnosis not present

## 2017-08-08 DIAGNOSIS — I482 Chronic atrial fibrillation, unspecified: Secondary | ICD-10-CM | POA: Diagnosis present

## 2017-08-08 DIAGNOSIS — I6509 Occlusion and stenosis of unspecified vertebral artery: Secondary | ICD-10-CM | POA: Diagnosis not present

## 2017-08-08 DIAGNOSIS — S72001G Fracture of unspecified part of neck of right femur, subsequent encounter for closed fracture with delayed healing: Secondary | ICD-10-CM | POA: Diagnosis not present

## 2017-08-08 DIAGNOSIS — M25571 Pain in right ankle and joints of right foot: Secondary | ICD-10-CM | POA: Diagnosis not present

## 2017-08-08 DIAGNOSIS — W19XXXD Unspecified fall, subsequent encounter: Secondary | ICD-10-CM | POA: Insufficient documentation

## 2017-08-08 DIAGNOSIS — S065X9A Traumatic subdural hemorrhage with loss of consciousness of unspecified duration, initial encounter: Secondary | ICD-10-CM | POA: Diagnosis present

## 2017-08-08 DIAGNOSIS — I480 Paroxysmal atrial fibrillation: Secondary | ICD-10-CM

## 2017-08-08 DIAGNOSIS — R262 Difficulty in walking, not elsewhere classified: Secondary | ICD-10-CM | POA: Insufficient documentation

## 2017-08-08 DIAGNOSIS — I25118 Atherosclerotic heart disease of native coronary artery with other forms of angina pectoris: Secondary | ICD-10-CM | POA: Diagnosis not present

## 2017-08-08 DIAGNOSIS — R609 Edema, unspecified: Secondary | ICD-10-CM

## 2017-08-08 DIAGNOSIS — I70201 Unspecified atherosclerosis of native arteries of extremities, right leg: Principal | ICD-10-CM | POA: Diagnosis present

## 2017-08-08 DIAGNOSIS — S32474A Nondisplaced fracture of medial wall of right acetabulum, initial encounter for closed fracture: Secondary | ICD-10-CM | POA: Diagnosis not present

## 2017-08-08 DIAGNOSIS — I2510 Atherosclerotic heart disease of native coronary artery without angina pectoris: Secondary | ICD-10-CM | POA: Diagnosis not present

## 2017-08-08 DIAGNOSIS — I441 Atrioventricular block, second degree: Secondary | ICD-10-CM | POA: Diagnosis not present

## 2017-08-08 DIAGNOSIS — I739 Peripheral vascular disease, unspecified: Secondary | ICD-10-CM | POA: Diagnosis not present

## 2017-08-08 DIAGNOSIS — S32401A Unspecified fracture of right acetabulum, initial encounter for closed fracture: Secondary | ICD-10-CM

## 2017-08-08 DIAGNOSIS — S065XAA Traumatic subdural hemorrhage with loss of consciousness status unknown, initial encounter: Secondary | ICD-10-CM | POA: Diagnosis present

## 2017-08-08 DIAGNOSIS — R9431 Abnormal electrocardiogram [ECG] [EKG]: Secondary | ICD-10-CM | POA: Diagnosis present

## 2017-08-08 DIAGNOSIS — Z79899 Other long term (current) drug therapy: Secondary | ICD-10-CM | POA: Insufficient documentation

## 2017-08-08 DIAGNOSIS — S329XXA Fracture of unspecified parts of lumbosacral spine and pelvis, initial encounter for closed fracture: Secondary | ICD-10-CM | POA: Diagnosis present

## 2017-08-08 DIAGNOSIS — Z8673 Personal history of transient ischemic attack (TIA), and cerebral infarction without residual deficits: Secondary | ICD-10-CM

## 2017-08-08 LAB — CBC WITH DIFFERENTIAL/PLATELET
BASOS ABS: 0 10*3/uL (ref 0.0–0.1)
Basophils Relative: 0 %
EOS PCT: 1 %
Eosinophils Absolute: 0.1 10*3/uL (ref 0.0–0.7)
HCT: 41 % (ref 39.0–52.0)
HEMOGLOBIN: 13.6 g/dL (ref 13.0–17.0)
Lymphocytes Relative: 11 %
Lymphs Abs: 0.6 10*3/uL — ABNORMAL LOW (ref 0.7–4.0)
MCH: 29.8 pg (ref 26.0–34.0)
MCHC: 33.2 g/dL (ref 30.0–36.0)
MCV: 89.9 fL (ref 78.0–100.0)
Monocytes Absolute: 0.4 10*3/uL (ref 0.1–1.0)
Monocytes Relative: 7 %
NEUTROS PCT: 81 %
Neutro Abs: 4.8 10*3/uL (ref 1.7–7.7)
PLATELETS: 370 10*3/uL (ref 150–400)
RBC: 4.56 MIL/uL (ref 4.22–5.81)
RDW: 16 % — ABNORMAL HIGH (ref 11.5–15.5)
WBC: 5.9 10*3/uL (ref 4.0–10.5)

## 2017-08-08 LAB — BASIC METABOLIC PANEL
ANION GAP: 6 (ref 5–15)
BUN: 16 mg/dL (ref 6–20)
CHLORIDE: 105 mmol/L (ref 101–111)
CO2: 26 mmol/L (ref 22–32)
Calcium: 9.3 mg/dL (ref 8.9–10.3)
Creatinine, Ser: 0.99 mg/dL (ref 0.61–1.24)
GFR calc Af Amer: >60 mL/min (ref >60)
GFR calc non Af Amer: >60 mL/min (ref >60)
Glucose, Bld: 103 mg/dL — ABNORMAL HIGH (ref 65–99)
POTASSIUM: 4.2 mmol/L (ref 3.5–5.1)
SODIUM: 137 mmol/L (ref 135–145)

## 2017-08-08 LAB — I-STAT TROPONIN, ED: TROPONIN I, POC: 0 ng/mL (ref 0.00–0.08)

## 2017-08-08 MED ORDER — VITAMIN D 1000 UNITS PO TABS
2000.0000 [IU] | ORAL_TABLET | Freq: Every day | ORAL | Status: DC
  Administered 2017-08-09: 2000 [IU] via ORAL
  Filled 2017-08-08 (×2): qty 2

## 2017-08-08 MED ORDER — LORATADINE 10 MG PO TABS
10.0000 mg | ORAL_TABLET | Freq: Every day | ORAL | Status: DC
  Administered 2017-08-08 – 2017-08-09 (×2): 10 mg via ORAL
  Filled 2017-08-08 (×2): qty 1

## 2017-08-08 MED ORDER — HEPARIN (PORCINE) IN NACL 100-0.45 UNIT/ML-% IJ SOLN
1250.0000 [IU]/h | INTRAMUSCULAR | Status: DC
  Administered 2017-08-08: 1150 [IU]/h via INTRAVENOUS
  Filled 2017-08-08 (×2): qty 250

## 2017-08-08 MED ORDER — OXYCODONE HCL 5 MG PO TABS
5.0000 mg | ORAL_TABLET | ORAL | Status: DC | PRN
Start: 2017-08-08 — End: 2017-08-09

## 2017-08-08 MED ORDER — SENNA 8.6 MG PO TABS
1.0000 | ORAL_TABLET | Freq: Every day | ORAL | Status: DC
  Administered 2017-08-08: 8.6 mg via ORAL
  Filled 2017-08-08: qty 1

## 2017-08-08 MED ORDER — SODIUM CHLORIDE 0.9% FLUSH
3.0000 mL | Freq: Two times a day (BID) | INTRAVENOUS | Status: DC
  Administered 2017-08-08 – 2017-08-09 (×2): 3 mL via INTRAVENOUS

## 2017-08-08 MED ORDER — FLUTICASONE FUROATE-VILANTEROL 100-25 MCG/INH IN AEPB
1.0000 | INHALATION_SPRAY | Freq: Every day | RESPIRATORY_TRACT | Status: DC | PRN
  Filled 2017-08-08: qty 28

## 2017-08-08 MED ORDER — ONDANSETRON HCL 4 MG PO TABS: 4.0000 mg | ORAL_TABLET | Freq: Four times a day (QID) | ORAL | Status: DC | PRN

## 2017-08-08 MED ORDER — ACETAMINOPHEN 650 MG RE SUPP: 650.0000 mg | Freq: Four times a day (QID) | RECTAL | Status: DC | PRN

## 2017-08-08 MED ORDER — SODIUM CHLORIDE 0.9% FLUSH: 3.0000 mL | INTRAVENOUS | Status: DC | PRN

## 2017-08-08 MED ORDER — ADULT MULTIVITAMIN W/MINERALS CH
1.0000 | ORAL_TABLET | Freq: Every day | ORAL | Status: DC
  Administered 2017-08-09: 1 via ORAL
  Filled 2017-08-08: qty 1

## 2017-08-08 MED ORDER — IOPAMIDOL (ISOVUE-370) INJECTION 76%
INTRAVENOUS | Status: AC
  Administered 2017-08-08: 100 mL
  Filled 2017-08-08: qty 100

## 2017-08-08 MED ORDER — ACETAMINOPHEN 325 MG PO TABS: 650.0000 mg | ORAL_TABLET | Freq: Four times a day (QID) | ORAL | Status: DC | PRN

## 2017-08-08 MED ORDER — AMIODARONE HCL 200 MG PO TABS
400.0000 mg | ORAL_TABLET | Freq: Two times a day (BID) | ORAL | Status: DC
  Administered 2017-08-08: 400 mg via ORAL
  Filled 2017-08-08: qty 2

## 2017-08-08 MED ORDER — ONDANSETRON HCL 4 MG/2ML IJ SOLN: 4.0000 mg | Freq: Four times a day (QID) | INTRAMUSCULAR | Status: DC | PRN

## 2017-08-08 MED ORDER — TAMSULOSIN HCL 0.4 MG PO CAPS
0.4000 mg | ORAL_CAPSULE | Freq: Every evening | ORAL | Status: DC
  Administered 2017-08-08: 0.4 mg via ORAL
  Filled 2017-08-08: qty 1

## 2017-08-08 MED ORDER — RANOLAZINE ER 500 MG PO TB12
500.0000 mg | ORAL_TABLET | Freq: Two times a day (BID) | ORAL | Status: DC
  Administered 2017-08-08 – 2017-08-09 (×2): 500 mg via ORAL
  Filled 2017-08-08 (×2): qty 1

## 2017-08-08 MED ORDER — SODIUM CHLORIDE 0.9 % IV SOLN: 250.0000 mL | INTRAVENOUS | Status: DC | PRN

## 2017-08-08 MED ORDER — LATANOPROST 0.005 % OP SOLN
1.0000 [drp] | Freq: Every day | OPHTHALMIC | Status: DC
  Administered 2017-08-08: 1 [drp] via OPHTHALMIC
  Filled 2017-08-08: qty 2.5

## 2017-08-08 NOTE — ED Triage Notes (Addendum)
Per [facility]: Patient to ED from [place] facility for R hip and leg pain. Patient had fall secondary to dizziness 2 weeks ago - was evaluated and d/c to facility. Patient has massive hematoma to R hip extending down R leg. Patient unable to bear weight or move without assistance d/t pain. Patient was taken off of Eliquis when last seen - EMS reports possible DVT in R calf - patient c/o pain from R hip down to lower R leg and foot. Pedal pulses equal bilaterally, sensation and skin temperature equal bilaterally as well. Patient denies SOB or CP. EMS VS: 122/78, P 90, RR 16, 95% RA.

## 2017-08-08 NOTE — ED Provider Notes (Signed)
Patient placed in Quick Look pathway, seen and evaluated   Chief Complaint: RLE pain   HPI:   84 y.o. M who presents for evaluation of right lower extremity pain.  Patient reports that he had a recent fall 2 weeks ago.  He was taken off of his Eliquis 2 weeks ago.  Patient reports that since then he has not been ambulating secondary to symptoms.  Patient reports that over the last few days, he has had worsening pain over the distal aspect of his right lower extremity, particularly around the ankle.  Denies any new trauma, injury, fall.  Patient reports that previously the pain has been radiating up his leg.  He had noticed some mild swelling and bruising around the area.  Patient reports that now the pain is more centralized to his right ankle.   ROS: RLE pain  Physical Exam:   Gen: No distress  Neuro: Awake and Alert  Skin: Warm    Focused Exam: 2+ DP pulses bilaterally.  Diffuse hematoma noted to the lateral aspect of the right lower extremity that extends from the hip distally along the lateral aspect of the femur.  No deformity or crepitus noted.  No calf tenderness noted.  No edema, erythema.   Initiation of care has begun. The patient has been counseled on the process, plan, and necessity for staying for the completion/evaluation, and the remainder of the medical screening examination    Volanda Napoleon, PA-C 08/08/17 [id number]    Isla Pence, MD 08/08/17 1016

## 2017-08-08 NOTE — H&P (Signed)
History and Physical    Donald [name] VFI:[id number] DOB: 1926/04/24 DOA: 08/08/2017  **Will admit patient based on the expectation that the patient will need hospitalization/ hospital care that crosses at least 2 midnights  PCP: [name], [name], MD   Attending physician: Purohit  Patient coming from/Resides with: SNF/[facility] for rehabilitative therapies  Chief Complaint: Right hip pain ongoing since previous admission, acute right below the knee pain and numbness  HPI: Donald [name] is a 84 y.o. male with medical history significant for A. fib, CAD/CABG last cardiac catheterization 4166, chronic systolic heart failure, prior CVA, aspirin and prostate cancer.  Patient has been hospitalized several times in the past several weeks.  Initially from 4/16 to 4/25 for persistent atrial fibrillation status post cardioversion on 4/18 and 4/23 with subsequent maintenance of sinus rhythm.  Because of hypotension beta-blocker was discontinued and patient's amiodarone dosage was adjusted upwards.  Because of associated heart failure exacerbation his Lasix had been increased from daily to twice daily.  He returned to the hospital on 4/27 after a fall found to be related to orthostasis.  This prompted changing his diuretic to prn dosing schedule.  Work-up at that time revealed a very tiny subdural hematoma 4 mm in size.  He was evaluated by Dr. Arnoldo Morale with neurosurgery.  Follow-up CT stable.  Neurosurgery recommended prn follow-up and deferred resumption of anticoagulation to cardiology.  According to the last progress note by cardiology on 4/30 it was documented that cardiology would consider restarting his anticoagulation in 2 weeks if not sustaining any more falls.  She was eventually discharged on 5/1 to SNF for rehabilitative therapies due to persistent right hip pain.  Today the patient awakened with abrupt onset of right lower extremity numbness and pain primarily below the knee.  This prompted  staff to send patient to the ER for further evaluation and treatment.  CTA of the lower extremity revealed occlusion of the right popliteal artery in the P1 segment as well as the proximal profunda femoris of indeterminate chronicity.  Because of the ongoing hip pain CT of the pelvis was obtained and demonstrated a right pelvic fracture which was not visible on plain films obtained during previous admission.  Vascular surgeon Dr. Donzetta Matters was consulted and noted on exam with similar temperature to touch as well as intact sensory motor bilaterally with resolution of previous complaints.  On the right there were no palpable pedal pulses but strong right DP by Doppler as well as right peroneal by Doppler.  Recommendation was for eventual arteriogram but no indication for acute surgical intervention i.e. embolectomy.  Because of the right pelvic fracture orthopedic services were also consulted by EDP who recommended no emergent surgical intervention.  ED Course:  Vital Signs: BP 139/87   Pulse 84   Temp 98 F (36.7 C) (Oral)   Resp 12   Ht 5\' 9"  (1.753 m)   Wt 82.1 kg (181 lb)   SpO2 99%   BMI 26.73 kg/m  Ankle x-ray: Negative CT angiogram right lower extremity: As above Lab data: Sodium 137, potassium 4.2, chloride 105, CO2 26, glucose 103, BUN 16, creatinine 0.99, anion gap 6, troponin negative, white count 5900 with neutrophils 81% absolute neutrophils 4.8%, hemoglobin 13.6, platelets 370,000 Medications and treatments: Heparin infusion ordered by EDP  Review of Systems:  In addition to the HPI above,  No Fever-chills, myalgias or other constitutional symptoms No Headache, changes with Vision or hearing, new weakness, tingling, numbness in any extremity, dizziness,  dysarthria or word finding difficulty, gait disturbance or imbalance, tremors or seizure activity No problems swallowing food or Liquids, indigestion/reflux, choking or coughing while eating, abdominal pain with or after eating No Chest  pain, Cough or Shortness of Breath, palpitations, orthopnea or DOE No Abdominal pain, N/V, melena,hematochezia, dark tarry stools, constipation No dysuria, malodorous urine, hematuria or flank pain No new skin rashes, lesions, masses or bruises, No new joint swelling or redness No recent unintentional weight gain or loss No polyuria, polydypsia or polyphagia   Past Medical History:  Diagnosis Date  . Asthma    with worsening with beta blockade  . Atrial fibrillation ([place])   . CAD (coronary artery disease)   . CVA (cerebral infarction) 2012   tia, mild no residual defecits  . GI bleed 8 yrs ago   due to doll fully vessel which was clipped  . Glaucoma    excellent cataracts  . History of kidney stones   . Ischemic cardiomyopathy   . Moderate to severe mitral regurgitation 06/16/2017  . Nephrolithiasis   . Post-infarction apical thrombus ([place])   . Prostate cancer ([place]) 2012  . Radiation proctitis Feb 2015   treated with APC  . Tubular adenoma 04/2013   Dr. Hilarie Fredrickson    Past Surgical History:  Procedure Laterality Date  . cardiac stents  2003  . CARDIOVERSION N/A 06/15/2017   Procedure: CARDIOVERSION;  Surgeon: Acie Fredrickson, Wonda Cheng, MD;  Location: [place];  Service: Cardiovascular;  Laterality: N/A;  . CARDIOVERSION N/A 07/15/2017   Procedure: CARDIOVERSION;  Surgeon: Jerline Pain, MD;  Location: [facility] ENDOSCOPY;  Service: Cardiovascular;  Laterality: N/A;  . CARDIOVERSION N/A 07/20/2017   Procedure: CARDIOVERSION;  Surgeon: Herminio Commons, MD;  Location: [facility] ENDOSCOPY;  Service: Cardiovascular;  Laterality: N/A;  . COLONOSCOPY WITH PROPOFOL N/A 05/05/2013   Procedure: COLONOSCOPY WITH PROPOFOL;  Surgeon: Jerene Bears, MD;  Location: [facility] ENDOSCOPY;  Service: Gastroenterology;  Laterality: N/A;  . CORONARY ARTERY BYPASS GRAFT  1998   LIMA to the LAD and saphenous vein graft tothe diagonal  . EXTRACORPOREAL SHOCK WAVE LITHOTRIPSY Right 04/29/2017   Procedure: RIGHT EXTRACORPOREAL  SHOCK WAVE LITHOTRIPSY (ESWL);  Surgeon: Alexis Frock, MD;  Location: [facility] ORS;  Service: Urology;  Laterality: Right;  . history of radiation treatment  2012   x 40 treatments done  . KNEE ARTHROSCOPY  2016 or 2017   Dr Gladstone Lighter ;   . PARTIAL KNEE ARTHROPLASTY Right 01/13/2017   Procedure: UNICOMPARTMENTAL RIGHT KNEE;  Surgeon: Gaynelle Arabian, MD;  Location: [facility] ORS;  Service: Orthopedics;  Laterality: Right;  with block    Social History   Socioeconomic History  . Marital status: Widowed    Spouse name: Not on file  . Number of children: 3  . Years of education: Not on file  . Highest education level: Not on file  Occupational History  . Occupation: Retired Conservation officer, [employer]  Social Needs  . Financial resource strain: Not on file  . Food insecurity:    Worry: Not on file    Inability: Not on file  . Transportation needs:    Medical: Not on file    Non-medical: Not on file  Tobacco Use  . Smoking status: Never Smoker  . Smokeless tobacco: Never Used  Substance and Sexual Activity  . Alcohol use: Yes    Alcohol/week: 0.0 oz    Comment: occasional beer or wine  . Drug use: No  . Sexual activity: Not Currently    Birth  control/protection: Abstinence  Lifestyle  . Physical activity:    Days per week: Not on file    Minutes per session: Not on file  . Stress: Not on file  Relationships  . Social connections:    Talks on phone: Not on file    Gets together: Not on file    Attends religious service: Not on file    Active member of club or organization: Not on file    Attends meetings of clubs or organizations: Not on file    Relationship status: Not on file  . Intimate partner violence:    Fear of current or ex partner: Not on file    Emotionally abused: Not on file    Physically abused: Not on file    Forced sexual activity: Not on file  Other Topics Concern  . Not on file  Social History Narrative  . Not on file    Mobility: Working with PT/OT at [employer] Work history: Not obtained   Allergies  Allergen Reactions  . Keflex [Cephalexin] Nausea And Vomiting and Other (See Comments)    'Killed all GI enzymes" (also) and patient prefers to not take this  . Morphine Nausea Only    Family History  Problem Relation Age of Onset  . Heart attack Father   . Heart disease Mother   . Stomach cancer Mother      Prior to Admission medications   Medication Sig Start Date End Date Taking? Authorizing Provider  amiodarone (PACERONE) 200 MG tablet Take 2 tablets (400 mg total) by mouth 2 (two) times daily. 07/22/17  Yes Lyda Jester M, PA-C  Cholecalciferol (VITAMIN D) 2000 UNITS tablet Take 2,000 Units by mouth daily.   Yes [provider]  Fluticasone Furoate-Vilanterol (BREO ELLIPTA) 100-25 MCG/INH AEPB Inhale 1 puff into the lungs daily as needed (shortness of breath).    Yes [provider]  furosemide (LASIX) 40 MG tablet Take 1 tablet (40 mg total) by mouth daily as needed for fluid or edema. 07/28/17  Yes Caren Griffins, MD  hydrocortisone cream 1 % Apply 1 application topically 3 (three) times daily as needed for itching (skin irritation). 06/16/17  Yes Eugenie Filler, MD  latanoprost (XALATAN) 0.005 % ophthalmic solution Place 1 drop into the left eye at bedtime.    Yes [provider]  loratadine (CLARITIN) 10 MG tablet Take 10 mg by mouth daily.   Yes [provider]  Multiple Vitamin (MULTIVITAMIN WITH MINERALS) TABS tablet Take 1 tablet by mouth daily.   Yes [provider]  oxyCODONE (OXY IR/ROXICODONE) 5 MG immediate release tablet Take 1-2 tablets (5-10 mg total) by mouth every 4 (four) hours as needed for moderate pain. 07/28/17  Yes Caren Griffins, MD  ranolazine (RANEXA) 500 MG 12 hr tablet Take 1 tablet (500 mg total) by mouth 2 (two) times daily. 07/22/17  Yes Simmons, Brittainy M, PA-C  senna (SENOKOT) 8.6 MG TABS tablet Take 1 tablet by mouth  at bedtime.   Yes [provider]  Tamsulosin HCl (FLOMAX) 0.4 MG CAPS Take 0.4 mg by mouth every evening.    Yes [provider]    Physical Exam: Vitals:   08/08/17 1433 08/08/17 1500 08/08/17 1530 08/08/17 1617  BP: 119/79 116/78 118/76 139/87  Pulse: 82 87 82 84  Resp: 16  16 12   Temp:      TempSrc:      SpO2: 99% 98% 98% 99%  Weight:  Height:          Constitutional: NAD, calm, comfortable Eyes: PERRL, lids and conjunctivae normal ENMT: Mucous membranes are moist. Posterior pharynx clear of any exudate or lesions.Normal dentition.  Neck: normal, supple, no masses, no thyromegaly Respiratory: clear to auscultation bilaterally, no wheezing, no crackles. Normal respiratory effort. No accessory muscle use.  Cardiovascular: Occasionally irregular rate and rhythm, no murmurs / rubs / gallops. No extremity edema.  Radial 2+ pedal pulses-left lower extremity pulses palpable at 1+, right lower extremity pulses below the knee present with Doppler only.. No carotid bruits.  Abdomen: no tenderness, no masses palpated. No hepatosplenomegaly. Bowel sounds positive.  Musculoskeletal: no clubbing / cyanosis. No joint deformity upper and lower extremities. Good ROM, no contractures. Normal muscle tone.  No right hip pain reproduced with palpation directly over anterior or lateral hip region. Skin: no rashes, lesions, ulcers. No induration Neurologic: CN 2-12 grossly intact. Sensation intact, DTR normal. Strength 5/5 x all 4 extremities.  Psychiatric: Normal judgment and insight. Alert and oriented x 3. Normal mood.    Labs on Admission: I have personally reviewed following labs and imaging studies  CBC: Recent Labs  Lab 08/08/17 1036  WBC 5.9  NEUTROABS 4.8  HGB 13.6  HCT 41.0  MCV 89.9  PLT 099   Basic Metabolic Panel: Recent Labs  Lab 08/08/17 1036  NA 137  K 4.2  CL 105  CO2 26  GLUCOSE 103*  BUN 16  CREATININE 0.99  CALCIUM 9.3   GFR: Estimated  Creatinine Clearance: 50.6 mL/min (by C-G formula based on SCr of 0.99 mg/dL). Liver Function Tests: No results for input(s): AST, ALT, ALKPHOS, BILITOT, PROT, ALBUMIN in the last 168 hours. No results for input(s): LIPASE, AMYLASE in the last 168 hours. No results for input(s): AMMONIA in the last 168 hours. Coagulation Profile: No results for input(s): INR, PROTIME in the last 168 hours. Cardiac Enzymes: No results for input(s): CKTOTAL, CKMB, CKMBINDEX, TROPONINI in the last 168 hours. BNP (last 3 results) No results for input(s): PROBNP in the last 8760 hours. HbA1C: No results for input(s): HGBA1C in the last 72 hours. CBG: No results for input(s): GLUCAP in the last 168 hours. Lipid Profile: No results for input(s): CHOL, HDL, LDLCALC, TRIG, CHOLHDL, LDLDIRECT in the last 72 hours. Thyroid Function Tests: No results for input(s): TSH, T4TOTAL, FREET4, T3FREE, THYROIDAB in the last 72 hours. Anemia Panel: No results for input(s): VITAMINB12, FOLATE, FERRITIN, TIBC, IRON, RETICCTPCT in the last 72 hours. Urine analysis: No results found for: COLORURINE, APPEARANCEUR, LABSPEC, PHURINE, GLUCOSEU, HGBUR, BILIRUBINUR, KETONESUR, PROTEINUR, UROBILINOGEN, NITRITE, LEUKOCYTESUR Sepsis Labs: @LABRCNTIP (procalcitonin:4,lacticidven:4) )No results found for this or any previous visit (from the past 240 hour(s)).   Radiological Exams on Admission: Dg Ankle Complete Right  Result Date: 08/08/2017 CLINICAL DATA:  RIGHT leg pain. Fell 2 weeks ago and is unable to bear weight on the leg due to hip pain from previous fall. RIGHT LATERAL ankle pain radiates up the leg. Symptoms started this morning. EXAM: RIGHT ANKLE - COMPLETE 3+ VIEW COMPARISON:  Ankle CT 11/02/2016 FINDINGS: No acute fracture or subluxation. Small plantar and Achilles calcaneal spurs are present. There is dense atherosclerotic calcification of the small vessels. Surgical clips are noted in the distal MEDIAL LOWER leg. IMPRESSION:  No evidence for acute  abnormality. Electronically Signed   By: Nolon Nations M.D.   On: 08/08/2017 10:27   Ct Head Wo Contrast  Result Date: 08/08/2017 CLINICAL DATA:  Ct head wo. Patient  had fall secondary to dizziness 2 weeks ago EXAM: CT HEAD WITHOUT CONTRAST TECHNIQUE: Contiguous axial images were obtained from the base of the skull through the vertex without intravenous contrast. COMPARISON:  CT 07/25/2017 and 07/24/2017 FINDINGS: Brain: There has been complete resolution of RIGHT subdural hematoma. There is moderate central and cortical atrophy. There is encephalomalacia in the LEFT posterior parietal lobe consistent with remote infarct. There is no intra or extra-axial fluid collection or mass lesion. The basilar cisterns and ventricles have a normal appearance. There is no CT evidence for acute infarction or hemorrhage. There is residual intravascular contrast following CT of the RIGHT LOWER extremity on the same day. Vascular: Significant atherosclerotic calcification of the carotid and vertebral arteries. Skull: No acute fracture. Sinuses/Orbits: No acute finding. Other: RIGHT frontal scalp edema/hematoma has improved significantly. IMPRESSION: 1. Interval resolution of RIGHT subdural hematoma and improving RIGHT scalp edema/hematoma. 2.  No evidence for acute intracranial abnormality. 3. Remote LEFT parietal infarct appears stable. Electronically Signed   By: Nolon Nations M.D.   On: 08/08/2017 16:20   Ct Angio Low Extrem Right W &/or Wo Contrast  Result Date: 08/08/2017 CLINICAL DATA:  84 year old male with a history of claudication. Concern for vascular disease. EXAM: CT OF THE LOWER RIGHT EXTREMITY WITH CONTRAST TECHNIQUE: Multidetector CT imaging of the lower right extremity was performed according to the standard protocol following intravenous contrast administration. COMPARISON:  None. CONTRAST:  168mL ISOVUE-370 IOPAMIDOL (ISOVUE-370) INJECTION 76% FINDINGS: Vascular: Aorta:  Visualized aorta without aneurysm. Mild atherosclerotic changes. Right lower extremity: Mild a moderate atherosclerotic changes of the right iliac system. No aneurysm or dissection. No stenosis or occlusion. Hypogastric arteries patent including anterior and posterior division. Mild tortuosity. Mild atherosclerotic changes of the common femoral artery. Profunda femoris is patent at the origin. Just beyond the origin there is occlusion of thigh branches, with no comparison. Mild atherosclerotic changes of the superficial femoral artery. There is occlusion of the popliteal artery in the P1 segment. No significant contrast within the distal popliteal artery or the tibial vessels. Tibial calcifications in all 3 tibial arteries. Nonvascular: Unremarkable appearance of the visualized small bowel and colon. No inflammatory changes within the abdomen. No lymphadenopathy visualized. Diameter of the prostate measures 4.5 cm Unremarkable appearance of the urinary bladder. Musculoskeletal: Redemonstration of right pelvic fracture/right acetabular fracture better characterized on the dedicated musculoskeletal pelvic CT. Surgical changes of right knee medial compartment resurfacing. IMPRESSION: Occlusion of the popliteal artery and the P1 segment, as well as the proximal profunda femoris, indeterminate chronicity. Correlation with the patient's presentation and right lower extremity symptoms is recommended. Atherosclerotic calcifications of the distal aorta and the right iliac artery without significant stenosis by CT imaging. Calcifications of the right tibial arteries. No significant contrast opacification, which may indicate late arrival of the contrast bolus versus thrombosis. Redemonstration of right pelvic fracture, better characterized on the dedicated musculoskeletal CT of today's date. These results were called by telephone at the time of interpretation on 08/08/2017 at 2:57 pm to Dr. Isla Pence , who verbally  acknowledged these results. Signed, Dulcy Fanny. Earleen Newport, DO [facility] Electronically Signed   By: Corrie Mckusick D.O.   On: 08/08/2017 14:57   Ct No Charge  Result Date: 08/08/2017 CLINICAL DATA:  Right hip pain since a fall 2 weeks ago. EXAM: CT ADDITIONAL VIEWS AT NO CHARGE TECHNIQUE: Axial coronal and sagittal reformatted images centered about the right hip are provided based on CT angiogram of the  right lower extremity this same day. CONTRAST:  100 cc Isovue 370. COMPARISON:  None. FINDINGS: The patient has an acute nondisplaced fracture of the right acetabulum. Fracture lines are seen through the anterior and medial walls of the acetabulum and across the periphery of the acetabular roof. Incomplete and nondisplaced fracture line in the high superior right pubic ramus is seen. No other acute fracture is identified. Remote healed left inferior pubic ramus fracture is noted. The right femoral head is located. Lower lumbar spondylosis is noted. IMPRESSION: Nondisplaced fracture of the anterior and medial walls and roof of the right acetabulum as described above. No other acute abnormality. Electronically Signed   By: Inge Rise M.D.   On: 08/08/2017 14:26    EKG: (Independently reviewed) possible second-degree AV block noting dropped QRS-needs longer rhythm strip to confirm; ventricular rate 86 bpm, QTC prolonged at 589 ms in the context of underlying right bundle branch block, no definitive acute ischemic changes-prolonged QTC new compared to EKG from 4/28  Assessment/Plan Principal Problem:   Right popliteal artery occlusion  -Patient presents with acute onset of right lower extremity pain and numbness now resolved with diminished pulses only present with Doppler on right lower extremity -Appreciate VVS assistance-will likely need arteriogram to better clarify -VVS recommends echocardiogram regarding possible embolic source in a patient  with known A. fib off anticoagulation x2 weeks -Anticoagulation recommended-EDP has started IV heparin -Neurovascular checks every 4 hours  Active Problems:   Closed fracture of right pelvis  -Sustained during recent fall; patient with ongoing right hip pain -Orthopedic physician contacted by EDP-no recommendations for urgent surgical treatment -Continue PT/OT -Symptom management with narcotic and nonnarcotic pain medications    Prolonged Q-T interval on ECG/chronic atrial fibrillation/? 2nd degree AVB Mobitz II -Status post cardioversion last month and has been maintaining sinus rhythm -Beta-blockers were discontinued because of hypotension in favor of amiodarone -Amiodarone due to be adjusted at follow-up cardiology visit tomorrow on 5/13 -Patient now with EKG suspicious for second-degree AV block Mobitz type II with at least one dropped QRS complex as well as prolonged QT interval **cardiology has documented patient is in sinus rhythm -Cardiology has been consulted -CHA2DS2-VASc= 4-need to transition back to Eliquis 5 mg twice daily at recommendation of cardiology prior to discharge if does not undergo invasive procedures during this hospitalization    Recent Traumatic subdural hemorrhage  -Noncontrasted CT of head today shows complete resolution of right subdural hematoma -Resume anticoagulation as above (discussed with cardiologist Dr. Meda Coffee)    Chronic systolic heart failure  -Currently compensated -No longer on beta-blocker secondary to hypotension -Lasix prn and issues with orthostatic hypotension and fall -Echocardiogram 06/13/2017: EF 40 to 45%, mild LVH, mild pulmonary hypertension, moderate TR, right ventricular dilatation, moderate to severe MR -Daily weights, strict I's/O    History of CVA (cerebrovascular accident) -No longer on statin or fenofibrate as of April 2019; patient with non-elevated lipid panel and after discussion with cardiology side effect profile in  elderly patient weight benefit of medication therefore medications were discontinued-I have updated the medication reconciliation record    Coronary artery disease/ history of CABG -No beta-blocker, statin or fenofibrate as documented above -Anticipate resumption of Eliquis for history of A. Fib-not on aspirin -Continue Ranexa    **Additional lab, imaging and/or diagnostic evaluation at discretion of supervising physician  DVT prophylaxis: Full dose heparin Code Status: DO NOT RESUSCITATE Family Communication: Wife at bedside; who is a Marine scientist updated by phone Disposition Plan: SNF Consults called:  VVS/[place]; orthopedic physician/[place]; Cardiology/[place]    ELLIS,ALLISON L. ANP-BC [facility] Pager [phone]   If 7PM-7AM, please contact night-coverage [email or web address] Password [facility]  08/08/2017, 4:48 PM

## 2017-08-08 NOTE — ED Triage Notes (Signed)
Patient seen by PA-C in triage, see orders.

## 2017-08-08 NOTE — ED Provider Notes (Signed)
[facility] EMERGENCY DEPARTMENT Provider Note   CSN: [id number] Arrival date & time: 08/08/17  0901     History   Chief Complaint No chief complaint on file.   HPI Donald [name] is a 84 y.o. male.  Pt presents to the ED today with continued right hip pain and right foot/ankle pain.  Pt passed out 2 weeks ago and sustained a right sided SDH and hematoma to right hip. He was admitted from 4/27 to 5/1.  He does have a hx of a.fib and is on Eliquis.  NS recommended holding Eliquis for 2 weeks (5/11), then f/u with cardiology to determine if it is ok to resume anticoagulation.  Pt has not yet resumed his Eliquis.  The syncope was thought to be orthostatic from dehydration.  The pt's SDH was stable on repeat CT scan, so no surgery was done.  Pt still having severe pain in his right hip and is not able to put much weight on right hip. Xrays were done during hospital stay, but no CT of hip.  This morning, pt developed severe pain to right ankle and foot.  It felt numb and it was cold.  He said it feels better now.  No new injury.  CHA2DS2/VAS Stroke Risk Points  Current as of 11 minutes ago     6 >= 2 Points: High Risk  1 - 1.99 Points: Medium Risk  0 Points: Low Risk    The patient's score has not changed in the past year.:  No Change     Details    This score determines the patient's risk of having a stroke if the  patient has atrial fibrillation.       Points Metrics  1 Has Congestive Heart Failure:  Yes    Current as of 11 minutes ago  1 Has Vascular Disease:  Yes    Current as of 11 minutes ago  0 Has Hypertension:  No    Current as of 11 minutes ago  2 Age:  70    Current as of 11 minutes ago  0 Has Diabetes:  No    Current as of 11 minutes ago  2 Had Stroke:  Yes  Had TIA:  Yes  Had thromboembolism:  No    Current as of 11 minutes ago  0 Male:  No    Current as of 11 minutes ago              Past Medical History:  Diagnosis Date  . Asthma      with worsening with beta blockade  . Atrial fibrillation ([place])   . CAD (coronary artery disease)   . CVA (cerebral infarction) 2012   tia, mild no residual defecits  . GI bleed 8 yrs ago   due to doll fully vessel which was clipped  . Glaucoma    excellent cataracts  . History of kidney stones   . Ischemic cardiomyopathy   . Moderate to severe mitral regurgitation 06/16/2017  . Nephrolithiasis   . Post-infarction apical thrombus ([place])   . Prostate cancer ([place]) 2012  . Radiation proctitis Feb 2015   treated with APC  . Tubular adenoma 04/2013   Dr. Hilarie Fredrickson    Patient Active Problem List   Diagnosis Date Noted  . 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 Closed fracture of right pelvis ([place]) 08/08/2017  . CAD (coronary artery disease) 08/08/2017  . Recent Traumatic subdural hemorrhage ([place]) 08/08/2017  .  Chronic atrial fibrillation ([place]) 08/08/2017  . Chronic systolic heart failure ([place]) 08/08/2017  . Coronary artery disease/ history of CABG 08/08/2017  . Subdural hematoma ([place]) 07/26/2017  . Contusion of right hip   . Syncope 07/25/2017  . Traumatic subdural hematoma ([place]) 07/24/2017  . History of cardioversion 07/20/2017  . Persistent atrial fibrillation ([place])   . Chronic systolic CHF (congestive heart failure) ([place]) 07/13/2017  . Moderate to severe mitral regurgitation 06/16/2017  . Acute combined systolic and diastolic congestive heart failure ([place]) 06/16/2017  . New onset of congestive heart failure ([place]) 06/11/2017  . AKI (acute kidney injury) ([place]) 06/11/2017  . Normocytic normochromic anemia 06/11/2017  . OA (osteoarthritis) of knee 01/13/2017  . Encounter for therapeutic drug monitoring 05/10/2013  . Radiation proctitis 05/05/2013  . Colonic polyp 05/05/2013  . Chest pain 09/17/2011  . PAF (paroxysmal atrial fibrillation) ([place]) 05/19/2010  . History of CVA (cerebrovascular accident) 03/30/2010  . MURAL THROMBUS, APEX OF HEART 09/18/2008  .  HYPERLIPIDEMIA 09/14/2008  . CATARACTS 09/14/2008  . Coronary atherosclerosis 09/14/2008  . CARDIOMYOPATHY 09/14/2008  . CEREBROVASCULAR ACCIDENT 09/14/2008  . TRANSIENT ISCHEMIC ATTACK 09/14/2008  . ASTHMA 09/14/2008  . GUAIAC POSITIVE STOOL 09/14/2008    Past Surgical History:  Procedure Laterality Date  . cardiac stents  2003  . CARDIOVERSION N/A 06/15/2017   Procedure: CARDIOVERSION;  Surgeon: Acie Fredrickson, Wonda Cheng, MD;  Location: [facility];  Service: Cardiovascular;  Laterality: N/A;  . CARDIOVERSION N/A 07/15/2017   Procedure: CARDIOVERSION;  Surgeon: Jerline Pain, MD;  Location: [facility] ENDOSCOPY;  Service: Cardiovascular;  Laterality: N/A;  . CARDIOVERSION N/A 07/20/2017   Procedure: CARDIOVERSION;  Surgeon: Herminio Commons, MD;  Location: [facility] ENDOSCOPY;  Service: Cardiovascular;  Laterality: N/A;  . COLONOSCOPY WITH PROPOFOL N/A 05/05/2013   Procedure: COLONOSCOPY WITH PROPOFOL;  Surgeon: Jerene Bears, MD;  Location: [facility] ENDOSCOPY;  Service: Gastroenterology;  Laterality: N/A;  . CORONARY ARTERY BYPASS GRAFT  1998   LIMA to the LAD and saphenous vein graft tothe diagonal  . EXTRACORPOREAL SHOCK WAVE LITHOTRIPSY Right 04/29/2017   Procedure: RIGHT EXTRACORPOREAL SHOCK WAVE LITHOTRIPSY (ESWL);  Surgeon: Alexis Frock, MD;  Location: [facility] ORS;  Service: Urology;  Laterality: Right;  . history of radiation treatment  2012   x 40 treatments done  . KNEE ARTHROSCOPY  2016 or 2017   Dr Gladstone Lighter ;   . PARTIAL KNEE ARTHROPLASTY Right 01/13/2017   Procedure: UNICOMPARTMENTAL RIGHT KNEE;  Surgeon: Gaynelle Arabian, MD;  Location: [facility] ORS;  Service: Orthopedics;  Laterality: Right;  with block        Home Medications    Prior to Admission medications   Medication Sig Start Date End Date Taking? Authorizing Provider  amiodarone (PACERONE) 200 MG tablet Take 2 tablets (400 mg total) by mouth 2 (two) times daily. 07/22/17  Yes Lyda Jester M, PA-C  Cholecalciferol (VITAMIN D) 2000 UNITS  tablet Take 2,000 Units by mouth daily.   Yes [provider]  fenofibrate micronized (LOFIBRA) 200 MG capsule Take 1 capsule (200 mg total) by mouth daily. 05/25/17  Yes Lelon Perla, MD  Fluticasone Furoate-Vilanterol (BREO ELLIPTA) 100-25 MCG/INH AEPB Inhale 1 puff into the lungs daily as needed (shortness of breath).    Yes [provider]  furosemide (LASIX) 40 MG tablet Take 1 tablet (40 mg total) by mouth daily as needed for fluid or edema. 07/28/17  Yes Gherghe, Vella Redhead, MD  hydrocortisone cream 1 % Apply 1 application topically 3 (three) times daily  as needed for itching (skin irritation). 06/16/17  Yes Eugenie Filler, MD  latanoprost (XALATAN) 0.005 % ophthalmic solution Place 1 drop into the left eye at bedtime.    Yes [provider]  loratadine (CLARITIN) 10 MG tablet Take 10 mg by mouth daily.   Yes [provider]  Multiple Vitamin (MULTIVITAMIN WITH MINERALS) TABS tablet Take 1 tablet by mouth daily.   Yes [provider]  oxyCODONE (OXY IR/ROXICODONE) 5 MG immediate release tablet Take 1-2 tablets (5-10 mg total) by mouth every 4 (four) hours as needed for moderate pain. 07/28/17  Yes Caren Griffins, MD  pravastatin (PRAVACHOL) 40 MG tablet Take 1 tablet (40 mg total) by mouth at bedtime. 01/16/14  Yes Lelon Perla, MD  ranolazine (RANEXA) 500 MG 12 hr tablet Take 1 tablet (500 mg total) by mouth 2 (two) times daily. 07/22/17  Yes Simmons, Brittainy M, PA-C  senna (SENOKOT) 8.6 MG TABS tablet Take 1 tablet by mouth at bedtime.   Yes [provider]  Tamsulosin HCl (FLOMAX) 0.4 MG CAPS Take 0.4 mg by mouth every evening.    Yes [provider]    Family History Family History  Problem Relation Age of Onset  . Heart attack Father   . Heart disease Mother   . Stomach cancer Mother     Social History Social History   Tobacco Use  . Smoking status: Never Smoker  . Smokeless tobacco: Never Used    Substance Use Topics  . Alcohol use: Yes    Alcohol/week: 0.0 oz    Comment: occasional beer or wine  . Drug use: No     Allergies   Keflex [cephalexin] and Morphine   Review of Systems Review of Systems  Musculoskeletal:       Right hip Right foot pain  All other systems reviewed and are negative.    Physical Exam Updated Vital Signs BP 119/79   Pulse 82   Temp 98 F (36.7 C) (Oral)   Resp 16   Ht 5\' 9"  (1.753 m)   Wt 82.1 kg (181 lb)   SpO2 99%   BMI 26.73 kg/m   Physical Exam  Constitutional: He is oriented to person, place, and time. He appears well-developed and well-nourished.  HENT:  Head: Normocephalic and atraumatic.  Right Ear: External ear normal.  Left Ear: External ear normal.  Nose: Nose normal.  Mouth/Throat: Oropharynx is clear and moist.  Eyes: Pupils are equal, round, and reactive to light. Conjunctivae and EOM are normal.  Neck: Normal range of motion. Neck supple.  Cardiovascular: Normal rate. An irregularly irregular rhythm present.  Pulmonary/Chest: Effort normal and breath sounds normal.  Abdominal: Soft. Bowel sounds are normal.  Musculoskeletal:       Right hip: He exhibits tenderness.  Ecchymosis to right thigh and hip region  No pulse below popliteal a.  Neurological: He is alert and oriented to person, place, and time.  Skin: Skin is warm. Capillary refill takes less than 2 seconds.  Psychiatric: He has a normal mood and affect. His behavior is normal. Thought content normal.  Nursing note and vitals reviewed.    ED Treatments / Results  Labs (all labs ordered are listed, but only abnormal results are displayed) Labs Reviewed  CBC WITH DIFFERENTIAL/PLATELET - Abnormal; Notable for the following components:      Result Value   RDW 16.0 (*)    Lymphs Abs 0.6 (*)    All other components within normal  limits  BASIC METABOLIC PANEL - Abnormal; Notable for the following components:   Glucose, Bld 103 (*)    All other  components within normal limits  HEPARIN LEVEL (UNFRACTIONATED)  I-STAT TROPONIN, ED    EKG None  Radiology Dg Ankle Complete Right  Result Date: 08/08/2017 CLINICAL DATA:  RIGHT leg pain. Fell 2 weeks ago and is unable to bear weight on the leg due to hip pain from previous fall. RIGHT LATERAL ankle pain radiates up the leg. Symptoms started this morning. EXAM: RIGHT ANKLE - COMPLETE 3+ VIEW COMPARISON:  Ankle CT 11/02/2016 FINDINGS: No acute fracture or subluxation. Small plantar and Achilles calcaneal spurs are present. There is dense atherosclerotic calcification of the small vessels. Surgical clips are noted in the distal MEDIAL LOWER leg. IMPRESSION: No evidence for acute  abnormality. Electronically Signed   By: Nolon Nations M.D.   On: 08/08/2017 10:27   Ct Angio Low Extrem Right W &/or Wo Contrast  Result Date: 08/08/2017 CLINICAL DATA:  84 year old male with a history of claudication. Concern for vascular disease. EXAM: CT OF THE LOWER RIGHT EXTREMITY WITH CONTRAST TECHNIQUE: Multidetector CT imaging of the lower right extremity was performed according to the standard protocol following intravenous contrast administration. COMPARISON:  None. CONTRAST:  154mL ISOVUE-370 IOPAMIDOL (ISOVUE-370) INJECTION 76% FINDINGS: Vascular: Aorta: Visualized aorta without aneurysm. Mild atherosclerotic changes. Right lower extremity: Mild a moderate atherosclerotic changes of the right iliac system. No aneurysm or dissection. No stenosis or occlusion. Hypogastric arteries patent including anterior and posterior division. Mild tortuosity. Mild atherosclerotic changes of the common femoral artery. Profunda femoris is patent at the origin. Just beyond the origin there is occlusion of thigh branches, with no comparison. Mild atherosclerotic changes of the superficial femoral artery. There is occlusion of the popliteal artery in the P1 segment. No significant contrast within the distal popliteal artery or the  tibial vessels. Tibial calcifications in all 3 tibial arteries. Nonvascular: Unremarkable appearance of the visualized small bowel and colon. No inflammatory changes within the abdomen. No lymphadenopathy visualized. Diameter of the prostate measures 4.5 cm Unremarkable appearance of the urinary bladder. Musculoskeletal: Redemonstration of right pelvic fracture/right acetabular fracture better characterized on the dedicated musculoskeletal pelvic CT. Surgical changes of right knee medial compartment resurfacing. IMPRESSION: Occlusion of the popliteal artery and the P1 segment, as well as the proximal profunda femoris, indeterminate chronicity. Correlation with the patient's presentation and right lower extremity symptoms is recommended. Atherosclerotic calcifications of the distal aorta and the right iliac artery without significant stenosis by CT imaging. Calcifications of the right tibial arteries. No significant contrast opacification, which may indicate late arrival of the contrast bolus versus thrombosis. Redemonstration of right pelvic fracture, better characterized on the dedicated musculoskeletal CT of today's date. These results were called by telephone at the time of interpretation on 08/08/2017 at 2:57 pm to Dr. Isla Pence , who verbally acknowledged these results. Signed, Dulcy Fanny. Earleen Newport, DO [facility] Radiology Electronically Signed   By: Corrie Mckusick D.O.   On: 08/08/2017 14:57   Ct No Charge  Result Date: 08/08/2017 CLINICAL DATA:  Right hip pain since a fall 2 weeks ago. EXAM: CT ADDITIONAL VIEWS AT NO CHARGE TECHNIQUE: Axial coronal and sagittal reformatted images centered about the right hip are provided based on CT angiogram of the right lower extremity this same day. CONTRAST:  100 cc Isovue 370. COMPARISON:  None. FINDINGS: The patient has an acute nondisplaced fracture of the right acetabulum. Fracture  lines are seen through the anterior  and medial walls of the acetabulum and across the periphery of the acetabular roof. Incomplete and nondisplaced fracture line in the high superior right pubic ramus is seen. No other acute fracture is identified. Remote healed left inferior pubic ramus fracture is noted. The right femoral head is located. Lower lumbar spondylosis is noted. IMPRESSION: Nondisplaced fracture of the anterior and medial walls and roof of the right acetabulum as described above. No other acute abnormality. Electronically Signed   By: Inge Rise M.D.   On: 08/08/2017 14:26   RLE [place]:  No dvt, but clot in right popliteal a.  Procedures Procedures (including critical care time)  Medications Ordered in ED Medications  heparin ADULT infusion 100 units/mL (25000 units/285mL sodium chloride 0.45%) (has no administration in time range)  iopamidol (ISOVUE-370) 76 % injection (100 mLs  Contrast Given 08/08/17 1323)     Initial Impression / Assessment and Plan / ED Course  I have reviewed the triage vital signs and the nursing notes.  Pertinent labs & imaging results that were available during my care of the patient were reviewed by me and considered in my medical decision making (see chart for details).  CRITICAL CARE Performed by: Isla Pence   Total critical care time: 30  minutes  Critical care time was exclusive of separately billable procedures and treating other patients.  Critical care was necessary to treat or prevent imminent or life-threatening deterioration.  Critical care was time spent personally by me on the following activities: development of treatment plan with patient and/or surrogate as well as nursing, discussions with consultants, evaluation of patient's response to treatment, examination of patient, obtaining history from patient or surrogate, ordering and performing treatments and interventions, ordering and review of laboratory studies, ordering and review of radiographic studies, pulse  oximetry and re-evaluation of patient's condition.   Pt d/w Dr. Donzetta Matters (vascular).  He does not feel like pt needs surgery now as his pain is gone.  I did consult pharmacy to restart heparin as NS said 2 weeks without anticoagulation and this has passed.  Pt d/w Dr. Griffin Basil about acetabular fx.  He will speak to the trauma ortho doctor about repair, but no repair now.  He is ok with starting heparin.  The pt d/w hospitalist for medical management.    Final Clinical Impressions(s) / ED Diagnoses   Final diagnoses:  Closed nondisplaced fracture of right acetabulum, unspecified portion of acetabulum, initial encounter [facility])  Occlusion of right popliteal artery [facility])  Atrial fibrillation, unspecified type [facility])    ED Discharge Orders    None       Isla Pence, MD 08/08/17 1605

## 2017-08-08 NOTE — Progress Notes (Signed)
Right lower extremity venous duplex has been completed. Negative for DVT. Incidental findings of a right popliteal artery occlusion. Results were given to Dr. Gilford Raid.  08/08/17 12:14 PM Donald [name] RVT

## 2017-08-08 NOTE — Consult Note (Signed)
Attempted to see patient in consultation for R acetabular fracture, non-displaced.  Patient resting comfortably at time.  Will attempt to see him again tomorrow.    Based on review of imaging ok for attempts at mobilization but patient will be NWB RLE.  Ok to get up and attempt movement with PT.

## 2017-08-08 NOTE — Consult Note (Addendum)
Hospital Consult    Reason for Consult:  Occluded R popliteal artery Requesting Physician:  Dr. Gilford Raid MRN #:  [id number]  History of Present Illness: This is a 84 y.o. male with PMH significant for atrial fibrillation, CAD with hx of CABG, CHF, hx of TIA.  He is seen in consultation for incidental finding on venous duplex of a right popliteal artery occlusion.  He was recently discharged from the hospital 2 weeks ago after sustaining a fall in his nursing facility and being diagnosed with a subdermal hematoma.  Recommendations at discharge was to hold his Eliquis for 2 more weeks.  Over the past couple weeks patient states he has not been walking much due to pain in his right hip and right leg.  However this morning he experienced extreme pain in his right foot as well as some numbness.  This has resolved at the time of examination in the emergency department.  He is unaware of any stenosis in the arteries of his bilateral lower extremities.  He denies history of claudication, rest pain, or nonhealing wounds.  He is taking a statin daily.  He denies tobacco use.  CTA aorta with runoff has been ordered by emergency room physician with results pending.  Past Medical History:  Diagnosis Date  . Asthma    with worsening with beta blockade  . Atrial fibrillation ([place])   . CAD (coronary artery disease)   . CVA (cerebral infarction) 2012   tia, mild no residual defecits  . GI bleed 8 yrs ago   due to doll fully vessel which was clipped  . Glaucoma    excellent cataracts  . History of kidney stones   . Ischemic cardiomyopathy   . Moderate to severe mitral regurgitation 06/16/2017  . Nephrolithiasis   . Post-infarction apical thrombus ([place])   . Prostate cancer ([place]) 2012  . Radiation proctitis Feb 2015   treated with APC  . Tubular adenoma 04/2013   Dr. Hilarie Fredrickson    Past Surgical History:  Procedure Laterality Date  . cardiac stents  2003  . CARDIOVERSION N/A 06/15/2017   Procedure:  CARDIOVERSION;  Surgeon: Acie Fredrickson, Wonda Cheng, MD;  Location: [facility];  Service: Cardiovascular;  Laterality: N/A;  . CARDIOVERSION N/A 07/15/2017   Procedure: CARDIOVERSION;  Surgeon: Jerline Pain, MD;  Location: [facility] ENDOSCOPY;  Service: Cardiovascular;  Laterality: N/A;  . CARDIOVERSION N/A 07/20/2017   Procedure: CARDIOVERSION;  Surgeon: Herminio Commons, MD;  Location: [facility] ENDOSCOPY;  Service: Cardiovascular;  Laterality: N/A;  . COLONOSCOPY WITH PROPOFOL N/A 05/05/2013   Procedure: COLONOSCOPY WITH PROPOFOL;  Surgeon: Jerene Bears, MD;  Location: [facility] ENDOSCOPY;  Service: Gastroenterology;  Laterality: N/A;  . CORONARY ARTERY BYPASS GRAFT  1998   LIMA to the LAD and saphenous vein graft tothe diagonal  . EXTRACORPOREAL SHOCK WAVE LITHOTRIPSY Right 04/29/2017   Procedure: RIGHT EXTRACORPOREAL SHOCK WAVE LITHOTRIPSY (ESWL);  Surgeon: Alexis Frock, MD;  Location: [facility] ORS;  Service: Urology;  Laterality: Right;  . history of radiation treatment  2012   x 40 treatments done  . KNEE ARTHROSCOPY  2016 or 2017   Dr Gladstone Lighter ;   . PARTIAL KNEE ARTHROPLASTY Right 01/13/2017   Procedure: UNICOMPARTMENTAL RIGHT KNEE;  Surgeon: Gaynelle Arabian, MD;  Location: [facility] ORS;  Service: Orthopedics;  Laterality: Right;  with block    Allergies  Allergen Reactions  . Keflex [Cephalexin] Nausea And Vomiting and Other (See Comments)    'Killed all GI enzymes" (also) and patient prefers to  not take this  . Morphine Nausea Only    Prior to Admission medications   Medication Sig Start Date End Date Taking? Authorizing Provider  amiodarone (PACERONE) 200 MG tablet Take 2 tablets (400 mg total) by mouth 2 (two) times daily. 07/22/17   Consuelo Pandy, PA-C  Cholecalciferol (VITAMIN D) 2000 UNITS tablet Take 2,000 Units by mouth daily.    [provider]  fenofibrate micronized (LOFIBRA) 200 MG capsule Take 1 capsule (200 mg total) by mouth daily. 05/25/17   Lelon Perla, MD  Fluticasone  Furoate-Vilanterol (BREO ELLIPTA) 100-25 MCG/INH AEPB Inhale 1 puff into the lungs daily as needed (shortness of breath).     [provider]  furosemide (LASIX) 40 MG tablet Take 1 tablet (40 mg total) by mouth daily as needed for fluid or edema. 07/28/17   Caren Griffins, MD  hydrocortisone cream 1 % Apply 1 application topically 3 (three) times daily as needed for itching (skin irritation). 06/16/17   Eugenie Filler, MD  latanoprost (XALATAN) 0.005 % ophthalmic solution Place 1 drop into the left eye at bedtime.     [provider]  loratadine (CLARITIN) 10 MG tablet Take 10 mg by mouth daily.    [provider]  Multiple Vitamin (MULTIVITAMIN WITH MINERALS) TABS tablet Take 1 tablet by mouth daily.    [provider]  oxyCODONE (OXY IR/ROXICODONE) 5 MG immediate release tablet Take 1-2 tablets (5-10 mg total) by mouth every 4 (four) hours as needed for moderate pain. 07/28/17   Caren Griffins, MD  pravastatin (PRAVACHOL) 40 MG tablet Take 1 tablet (40 mg total) by mouth at bedtime. 01/16/14   Lelon Perla, MD  ranolazine (RANEXA) 500 MG 12 hr tablet Take 1 tablet (500 mg total) by mouth 2 (two) times daily. 07/22/17   Lyda Jester M, PA-C  Tamsulosin HCl (FLOMAX) 0.4 MG CAPS Take 0.4 mg by mouth every evening.     [provider]    Social History   Socioeconomic History  . Marital status: Widowed    Spouse name: Not on file  . Number of children: 3  . Years of education: Not on file  . Highest education level: Not on file  Occupational History  . Occupation: Retired Conservation officer, [employer]  Social Needs  . Financial resource strain: Not on file  . Food insecurity:    Worry: Not on file    Inability: Not on file  . Transportation needs:    Medical: Not on file    Non-medical: Not on file  Tobacco Use  . Smoking status: Never Smoker  . Smokeless tobacco: Never Used  Substance and Sexual Activity  . Alcohol use: Yes     Alcohol/week: 0.0 oz    Comment: occasional beer or wine  . Drug use: No  . Sexual activity: Not Currently    Birth control/protection: Abstinence  Lifestyle  . Physical activity:    Days per week: Not on file    Minutes per session: Not on file  . Stress: Not on file  Relationships  . Social connections:    Talks on phone: Not on file    Gets together: Not on file    Attends religious service: Not on file    Active member of club or organization: Not on file    Attends meetings of clubs or organizations: Not on file    Relationship status: Not on file  . Intimate partner violence:    Fear  of current or ex partner: Not on file    Emotionally abused: Not on file    Physically abused: Not on file    Forced sexual activity: Not on file  Other Topics Concern  . Not on file  Social History Narrative  . Not on file     Family History  Problem Relation Age of Onset  . Heart attack Father   . Heart disease Mother   . Stomach cancer Mother     ROS: Otherwise negative unless mentioned in HPI  Physical Examination  Vitals:   08/08/17 1200 08/08/17 1230  BP: 115/75 121/78  Pulse: 84 87  Resp: 16 16  Temp:    SpO2: 95% 100%   Body mass index is 26.73 kg/m.  General:  WDWN in NAD Gait: Not observed HENT: WNL, normocephalic Pulmonary: normal non-labored breathing, without Rales, rhonchi,  wheezing Cardiac: irregular Abdomen: soft, NT/ND, no masses Skin: without rashes Vascular Exam/Pulses: Symmetric femoral pulses; no palpable pedal pulses however strong right DP Doppler as well as right peroneal by Doppler; left PT and DP by Doppler; feet are symmetrically warm to touch; sensation and motor and feet also symmetrical Extremities: without ischemic changes, without Gangrene , without cellulitis; without open wounds;  Musculoskeletal: no muscle wasting or atrophy  Neurologic: A&O X 3;  No focal weakness or paresthesias are detected; speech is fluent/normal Psychiatric:   The pt has Normal affect. Lymph:  Unremarkable  CBC    Component Value Date/Time   WBC 5.9 08/08/2017 1036   RBC 4.56 08/08/2017 1036   HGB 13.6 08/08/2017 1036   HCT 41.0 08/08/2017 1036   PLT 370 08/08/2017 1036   MCV 89.9 08/08/2017 1036   MCH 29.8 08/08/2017 1036   MCHC 33.2 08/08/2017 1036   RDW 16.0 (H) 08/08/2017 1036   LYMPHSABS 0.6 (L) 08/08/2017 1036   MONOABS 0.4 08/08/2017 1036   EOSABS 0.1 08/08/2017 1036   BASOSABS 0.0 08/08/2017 1036    BMET    Component Value Date/Time   NA 137 08/08/2017 1036   NA 144 07/05/2017 1502   K 4.2 08/08/2017 1036   CL 105 08/08/2017 1036   CO2 26 08/08/2017 1036   GLUCOSE 103 (H) 08/08/2017 1036   BUN 16 08/08/2017 1036   BUN 32 (H) 07/05/2017 1502   CREATININE 0.99 08/08/2017 1036   CREATININE 0.92 03/11/2016 0954   CALCIUM 9.3 08/08/2017 1036   GFRNONAA >60 08/08/2017 1036   GFRAA >60 08/08/2017 1036    COAGS: Lab Results  Component Value Date   INR 1.33 07/26/2017   INR 1.59 06/15/2017   INR 1.16 01/06/2017   PROTIME 16.4 09/10/2008     Non-Invasive Vascular Imaging:   Right lower extremity venous duplex negative for DVT; incidental finding of right popliteal artery occlusion  CTA aorta with runoff pending  Statin:  Yes.   Beta Blocker:  No. Aspirin:  No. ACEI:  No. ARB:  Yes.   CCB use:  No Other antiplatelets/anticoagulants:  Yes.   Eliquis   ASSESSMENT/PLAN: This is a 84 y.o. male with incidental finding of right popliteal artery occlusion during venous duplex  - Patient has not restarted Eliquis since discharge due to subdural hematoma - Currently patient exhibits no sign of critical limb ischemia with distal signals by Doppler as well as intact sensation and motor exam - No indication for emergent revascularization by my exam; occlusive disease likely chronic - Recommendations would include restarting anticoagulation if okay with neurosurgery - Further recommendations pending  CTA results -  Consider orthopedic follow-up due to persistent hip pain and immobility -On-call vascular surgeon Dr. Donzetta Matters will evaluate the patient later today   Dagoberto Ligas PA-C [facility] [phone]    I have interviewed and examined patient with PA and agree with assessment and plan above.  I reviewed the CTA which demonstrates occlusion of the popliteal artery on the right she is consistent with physical exam where he has a palpable popliteal artery on the left but not the right.  On both sides he has monophasic signals at the ankle.  At this time both feet are similar temperature and both are sensorimotor intact and he has no foot complaints on the right at all.  Given that he does have atrial fibrillation and has been off his Eliquis this certainly raises concern that he could have had embolic disease to his right lower extremity.  He however does not have any symptoms to merit any intervention at this time but I would not rule out an acute embolic on chronic underlying calcific disease scenario.  He is likely going to require angiogram in the near future but I would like him to be cleared for anticoagulation by neurosurgery prior to this.   Meiko Stranahan C. Donzetta Matters, MD [facility] [place] Office: [phone] Pager: [phone]

## 2017-08-08 NOTE — Consult Note (Addendum)
Cardiology Consultation:   Patient ID: Donald [name]; [id number]; 1926-08-20   Admit date: 08/08/2017 Date of Consult: 08/08/2017  Primary Care Provider: Haywood Pao, MD Primary Cardiologist: Kirk Ruths, MD   Patient Profile:   Donald [name] is a 84 y.o. male with a hx of CAD with hx of CABG, CHF, hx of TIA, PAF, subdural hematoma who is being seen today for the evaluation of a-fib, anticoagulation at the request of Volanda Napoleon, PA-C.  History of Present Illness:   Donald [name] was  recently discharge from the hospital on 07/22/17 after being admitted for management of his atrial fibrillation where he was started on amiodarone after unsuccessful first DCCV, and was successfully converted to NSR on second attempt. He was also treated for acute on chronic combined CHF with IV lasix and transitioned to po 40mg  BID at discharge - he was noted to have a dry weight of 173lbs. During this admission he was also taken of home bisoprolol due to hypotension.  He was readmitted on 07/26/2017 after a fall/syncope, striking the right side of his forehead and right hip. He was found to have a 11mm acute subdural hematoma on CT scan. Neurosurgery evaluated the patient and follow-up CT revealed a stable, if not decreasing, SDH.  He had has no neurological deficits. His anticoagulation was held with instructions to restart in 2 weeks (today) and follow up in our clinic tomorrow for amiodarone titration.   He presented to the ER today with RLE pain, US showed right popliteal artery occlusion.  Dr Donzetta Matters from [facility] states: no sign of critical limb ischemia with distal signals by Doppler as well as intact sensation and motor exam, no indication for emergent revascularization by my exam; occlusive disease likely chronic - Recommendations would include restarting anticoagulation if okay with neurosurgery, further recommendations pending CTA results.  CTA aorta with runoff has been ordered by emergency room  physician with results pending. He also has findings of a nondisplaced fracture of the anterior and medial walls and roof of the right acetabulum.  Past Medical History:  Diagnosis Date  . Asthma    with worsening with beta blockade  . Atrial fibrillation ([place])   . CAD (coronary artery disease)   . CVA (cerebral infarction) 2012   tia, mild no residual defecits  . GI bleed 8 yrs ago   due to doll fully vessel which was clipped  . Glaucoma    excellent cataracts  . History of kidney stones   . Ischemic cardiomyopathy   . Moderate to severe mitral regurgitation 06/16/2017  . Nephrolithiasis   . Post-infarction apical thrombus ([place])   . Prostate cancer ([place]) 2012  . Radiation proctitis Feb 2015   treated with APC  . Tubular adenoma 04/2013   Dr. Hilarie Fredrickson    Past Surgical History:  Procedure Laterality Date  . cardiac stents  2003  . CARDIOVERSION N/A 06/15/2017   Procedure: CARDIOVERSION;  Surgeon: Acie Fredrickson, Wonda Cheng, MD;  Location: [facility];  Service: Cardiovascular;  Laterality: N/A;  . CARDIOVERSION N/A 07/15/2017   Procedure: CARDIOVERSION;  Surgeon: Jerline Pain, MD;  Location: [facility] ENDOSCOPY;  Service: Cardiovascular;  Laterality: N/A;  . CARDIOVERSION N/A 07/20/2017   Procedure: CARDIOVERSION;  Surgeon: Herminio Commons, MD;  Location: [facility] ENDOSCOPY;  Service: Cardiovascular;  Laterality: N/A;  . COLONOSCOPY WITH PROPOFOL N/A 05/05/2013   Procedure: COLONOSCOPY WITH PROPOFOL;  Surgeon: Jerene Bears, MD;  [facility] ENDOSCOPY;  Service: Gastroenterology;  Laterality: N/A;  .  CORONARY ARTERY BYPASS GRAFT  1998   LIMA to the LAD and saphenous vein graft tothe diagonal  . EXTRACORPOREAL SHOCK WAVE LITHOTRIPSY Right 04/29/2017   Procedure: RIGHT EXTRACORPOREAL SHOCK WAVE LITHOTRIPSY (ESWL);  Surgeon: Alexis Frock, MD;  Location: [facility];  Service: Urology;  Laterality: Right;  . history of radiation treatment  2012   x 40 treatments done  . KNEE ARTHROSCOPY  2016 or 2017    Dr Gladstone Lighter ;   . PARTIAL KNEE ARTHROPLASTY Right 01/13/2017   Procedure: UNICOMPARTMENTAL RIGHT KNEE;  Surgeon: Gaynelle Arabian, MD;  Location: [facility];  Service: Orthopedics;  Laterality: Right;  with block    Inpatient Medications: Scheduled Meds:  Continuous Infusions: . heparin     PRN Meds:  Allergies:    Allergies  Allergen Reactions  . Keflex [Cephalexin] Nausea And Vomiting and Other (See Comments)    'Killed all GI enzymes" (also) and patient prefers to not take this  . Morphine Nausea Only    Social History:   Social History   Socioeconomic History  . Marital status: Widowed    Spouse name: Not on file  . Number of children: 3  . Years of education: Not on file  . Highest education level: Not on file  Occupational History  . Occupation: Retired Conservation officer, [employer]  Social Needs  . Financial resource strain: Not on file  . Food insecurity:    Worry: Not on file    Inability: Not on file  . Transportation needs:    Medical: Not on file    Non-medical: Not on file  Tobacco Use  . Smoking status: Never Smoker  . Smokeless tobacco: Never Used  Substance and Sexual Activity  . Alcohol use: Yes    Alcohol/week: 0.0 oz    Comment: occasional beer or wine  . Drug use: No  . Sexual activity: Not Currently    Birth control/protection: Abstinence  Lifestyle  . Physical activity:    Days per week: Not on file    Minutes per session: Not on file  . Stress: Not on file  Relationships  . Social connections:    Talks on phone: Not on file    Gets together: Not on file    Attends religious service: Not on file    Active member of club or organization: Not on file    Attends meetings of clubs or organizations: Not on file    Relationship status: Not on file  . Intimate partner violence:    Fear of current or ex partner: Not on file    Emotionally abused: Not on file    Physically abused: Not on file    Forced sexual activity: Not on file  Other Topics Concern    . Not on file  Social History Narrative  . Not on file    Family History:    Family History  Problem Relation Age of Onset  . Heart attack Father   . Heart disease Mother   . Stomach cancer Mother      ROS:  Please see the history of present illness.  All other ROS reviewed and negative.     Physical Exam/Data:   Vitals:   08/08/17 1230 08/08/17 1433 08/08/17 1500 08/08/17 1530  BP: 121/78 119/79 116/78 118/76  Pulse: 87 82 87 82  Resp: 16 16  16   Temp:      TempSrc:      SpO2: 100% 99% 98% 98%  Weight:  Height:       No intake or output data in the 24 hours ending 08/08/17 1609 Filed Weights   08/08/17 1225  Weight: 181 lb (82.1 kg)   Body mass index is 26.73 kg/m.  General:  Well nourished, well developed, in no acute distress HEENT: normal Lymph: no adenopathy Neck: no JVD Endocrine:  No thryomegaly Vascular: No carotid bruits; FA pulses 2+ bilaterally without bruits  Cardiac:  normal S1, S2; RRR; no murmur  Lungs:  clear to auscultation bilaterally, no wheezing, rhonchi or rales  Abd: soft, nontender, no hepatomegaly  Ext: no edema Musculoskeletal:  No deformities, BUE and BLE strength normal and equal Skin: warm and dry  Neuro:  CNs 2-12 intact, no focal abnormalities noted Psych:  Normal affect   EKG:  The EKG was personally reviewed and demonstrates:  Atrial flutter with controlled ventricular response, this is the same as on the ECGs from 4/28 and 4/27.  Telemetry:  Telemetry was personally reviewed and demonstrates:  Atrial flutter with ventricular rates in 80'.  Relevant CV Studies:  Laboratory Data:  Chemistry Recent Labs  Lab 08/08/17 1036  NA 137  K 4.2  CL 105  CO2 26  GLUCOSE 103*  BUN 16  CREATININE 0.99  CALCIUM 9.3  GFRNONAA >60  GFRAA >60  ANIONGAP 6    No results for input(s): PROT, ALBUMIN, AST, ALT, ALKPHOS, BILITOT in the last 168 hours. Hematology Recent Labs  Lab 08/08/17 1036  WBC 5.9  RBC 4.56  HGB  13.6  HCT 41.0  MCV 89.9  MCH 29.8  MCHC 33.2  RDW 16.0*  PLT 370   Cardiac EnzymesNo results for input(s): TROPONINI in the last 168 hours. No results for input(s): TROPIPOC in the last 168 hours.  BNPNo results for input(s): BNP, PROBNP in the last 168 hours.  DDimer No results for input(s): DDIMER in the last 168 hours.  Radiology/Studies:  Dg Ankle Complete Right  Result Date: 08/08/2017 CLINICAL DATA:  RIGHT leg pain. IMPRESSION: No evidence for acute  abnormality. Electronically Signed    Ct Angio Low Extrem Right W &/or Wo Contrast  Result Date: 08/08/2017 CLINICAL DATA:  84 year old male with a history of claudication. IMPRESSION: Occlusion of the popliteal artery and the P1 segment, as well as the proximal profunda femoris, indeterminate chronicity. Correlation with the patient's presentation and right lower extremity symptoms is recommended. Atherosclerotic calcifications of the distal aorta and the right iliac artery without significant stenosis by CT imaging. Calcifications of the right tibial arteries. No significant contrast opacification, which may indicate late arrival of the contrast bolus versus thrombosis. Redemonstration of right pelvic fracture, better characterized on the dedicated musculoskeletal CT of today's date. These results were called by telephone at the time of interpretation on 08/08/2017 at 2:57 pm to Dr. Isla Pence , who verbally acknowledged these results. Signed, Dulcy Fanny. Earleen Newport, DO [facility] Radiology Electronically Signed   By: Corrie Mckusick D.O.   On: 08/08/2017 14:57   Ct No Charge  Result Date: 08/08/2017 CLINICAL DATA:  Right hip pain since a fall 2 weeks ago.  IMPRESSION: Nondisplaced fracture of the anterior and medial walls and roof of the right acetabulum as described above. No other acute abnormality. Electronically Signed   By: Inge Rise M.D.   On: 08/08/2017 14:26   CT head:  08/08/2017 IMPRESSION: 1. Interval resolution of RIGHT subdural hematoma and improving RIGHT scalp edema/hematoma. 2.  No evidence for acute intracranial abnormality.  3. Remote LEFT parietal infarct appears stable.   Assessment and Plan:   PAF  - the patient is in atrial flutter with controlled rate, I would still decrease amiodarone to 200 mg po daily - restart anticoagulation given resolution of subdural hematoma on CT head today and popliteal artery occlusion. Start Eliquis 5 mg PO BID. - cardioversion should be considered after 4 weeks of full anticoagulation -  CHADS2Vasc score of 4  SDH  - resolved   Chronic combined CHF:  - appears euvolemic  CAD s/p CABG: without anginal complaints - Continue ranexa and statin  CKD stage III: Cr improved to 0.99, GFR> 60, follow post contrast use  For questions or updates, please contact [facility] Please consult [email or web address] for contact info under Cardiology/STEMI.   Signed, Ena Dawley, MD  08/08/2017 4:09 PM

## 2017-08-08 NOTE — Progress Notes (Signed)
After careful review of telemetry strips and recent EKG cardiology has addended initial entry that patient was in sinus rhythm and now documents that the patient is in atrial flutter with controlled rate.  Recommendation is to consider cardioversion 4 weeks after maintained on full dose anticoagulation-we will continue telemetry monitoring for now  Erin Hearing, ANP

## 2017-08-08 NOTE — Progress Notes (Signed)
Donald [name] for heparin Indication: atrial fibrillation, arterial occlusion  Heparin Dosing Weight: 82.1 kg  Labs: Recent Labs    08/08/17 1036  HGB 13.6  HCT 41.0  PLT 370  CREATININE 0.99    Assessment: 81 yom with hx of afib, TIA with incidental finding of R popliteal artery occlusion during venous duplex. He was recently discharged from the hospital 2 weeks ago after sustaining a fall in nursing facility and being diagnosed with a subdermal hematoma. Recommendation at discharge was to hold his Eliquis for 2 more weeks (has not resumed yet). Pharmacy consulted to dose heparin. No indication for emergent revascularization at this time. Dopplers negative for DVT. CBC wnl. No current bleed issues documented.  Goal of Therapy:  Heparin level 0.3-0.7 units/ml Monitor platelets by anticoagulation protocol: Yes   Plan:  No bolus with advanced age and recent subdural hematoma Start heparin at 1150 units/h 8h heparin level Monitor daily heparin level and CBC, s/sx bleeding F/u Vascular plans  Elicia Lamp, PharmD, BCPS Clinical Pharmacist 08/08/2017 3:24 PM

## 2017-08-08 NOTE — ED Notes (Signed)
Patient transported to CT 

## 2017-08-09 ENCOUNTER — Other Ambulatory Visit: Payer: Self-pay | Admitting: Licensed Clinical Social Worker

## 2017-08-09 DIAGNOSIS — R278 Other lack of coordination: Secondary | ICD-10-CM | POA: Diagnosis not present

## 2017-08-09 DIAGNOSIS — S32424A Nondisplaced fracture of posterior wall of right acetabulum, initial encounter for closed fracture: Secondary | ICD-10-CM | POA: Diagnosis not present

## 2017-08-09 DIAGNOSIS — K9181 Other intraoperative complications of digestive system: Secondary | ICD-10-CM | POA: Diagnosis not present

## 2017-08-09 DIAGNOSIS — J449 Chronic obstructive pulmonary disease, unspecified: Secondary | ICD-10-CM | POA: Diagnosis not present

## 2017-08-09 DIAGNOSIS — S32401D Unspecified fracture of right acetabulum, subsequent encounter for fracture with routine healing: Secondary | ICD-10-CM | POA: Diagnosis not present

## 2017-08-09 DIAGNOSIS — Z8673 Personal history of transient ischemic attack (TIA), and cerebral infarction without residual deficits: Secondary | ICD-10-CM | POA: Diagnosis not present

## 2017-08-09 DIAGNOSIS — I25810 Atherosclerosis of coronary artery bypass graft(s) without angina pectoris: Secondary | ICD-10-CM | POA: Diagnosis not present

## 2017-08-09 DIAGNOSIS — I480 Paroxysmal atrial fibrillation: Secondary | ICD-10-CM | POA: Diagnosis not present

## 2017-08-09 DIAGNOSIS — E559 Vitamin D deficiency, unspecified: Secondary | ICD-10-CM | POA: Diagnosis not present

## 2017-08-09 DIAGNOSIS — S32401A Unspecified fracture of right acetabulum, initial encounter for closed fracture: Secondary | ICD-10-CM | POA: Diagnosis not present

## 2017-08-09 DIAGNOSIS — R262 Difficulty in walking, not elsewhere classified: Secondary | ICD-10-CM | POA: Diagnosis not present

## 2017-08-09 DIAGNOSIS — J3089 Other allergic rhinitis: Secondary | ICD-10-CM | POA: Diagnosis not present

## 2017-08-09 DIAGNOSIS — I5022 Chronic systolic (congestive) heart failure: Secondary | ICD-10-CM | POA: Diagnosis not present

## 2017-08-09 DIAGNOSIS — E569 Vitamin deficiency, unspecified: Secondary | ICD-10-CM | POA: Diagnosis not present

## 2017-08-09 DIAGNOSIS — H409 Unspecified glaucoma: Secondary | ICD-10-CM | POA: Diagnosis not present

## 2017-08-09 DIAGNOSIS — R2689 Other abnormalities of gait and mobility: Secondary | ICD-10-CM | POA: Diagnosis not present

## 2017-08-09 DIAGNOSIS — I743 Embolism and thrombosis of arteries of the lower extremities: Secondary | ICD-10-CM | POA: Diagnosis not present

## 2017-08-09 DIAGNOSIS — I340 Nonrheumatic mitral (valve) insufficiency: Secondary | ICD-10-CM | POA: Diagnosis not present

## 2017-08-09 DIAGNOSIS — S32414A Nondisplaced fracture of anterior wall of right acetabulum, initial encounter for closed fracture: Secondary | ICD-10-CM | POA: Diagnosis not present

## 2017-08-09 DIAGNOSIS — C679 Malignant neoplasm of bladder, unspecified: Secondary | ICD-10-CM | POA: Diagnosis not present

## 2017-08-09 DIAGNOSIS — I70201 Unspecified atherosclerosis of native arteries of extremities, right leg: Secondary | ICD-10-CM | POA: Diagnosis not present

## 2017-08-09 DIAGNOSIS — M80011D Age-related osteoporosis with current pathological fracture, right shoulder, subsequent encounter for fracture with routine healing: Secondary | ICD-10-CM | POA: Diagnosis not present

## 2017-08-09 DIAGNOSIS — I483 Typical atrial flutter: Secondary | ICD-10-CM | POA: Diagnosis not present

## 2017-08-09 DIAGNOSIS — R279 Unspecified lack of coordination: Secondary | ICD-10-CM | POA: Diagnosis not present

## 2017-08-09 DIAGNOSIS — S065X9D Traumatic subdural hemorrhage with loss of consciousness of unspecified duration, subsequent encounter: Secondary | ICD-10-CM | POA: Diagnosis not present

## 2017-08-09 DIAGNOSIS — E8770 Fluid overload, unspecified: Secondary | ICD-10-CM | POA: Diagnosis not present

## 2017-08-09 DIAGNOSIS — S32411B Displaced fracture of anterior wall of right acetabulum, initial encounter for open fracture: Secondary | ICD-10-CM | POA: Diagnosis not present

## 2017-08-09 DIAGNOSIS — S7001XA Contusion of right hip, initial encounter: Secondary | ICD-10-CM | POA: Diagnosis not present

## 2017-08-09 DIAGNOSIS — R296 Repeated falls: Secondary | ICD-10-CM | POA: Diagnosis not present

## 2017-08-09 DIAGNOSIS — S7001XD Contusion of right hip, subsequent encounter: Secondary | ICD-10-CM | POA: Diagnosis not present

## 2017-08-09 DIAGNOSIS — I10 Essential (primary) hypertension: Secondary | ICD-10-CM | POA: Diagnosis not present

## 2017-08-09 DIAGNOSIS — F4329 Adjustment disorder with other symptoms: Secondary | ICD-10-CM | POA: Diagnosis not present

## 2017-08-09 DIAGNOSIS — M25551 Pain in right hip: Secondary | ICD-10-CM | POA: Diagnosis not present

## 2017-08-09 DIAGNOSIS — I739 Peripheral vascular disease, unspecified: Secondary | ICD-10-CM | POA: Diagnosis not present

## 2017-08-09 DIAGNOSIS — S065X0A Traumatic subdural hemorrhage without loss of consciousness, initial encounter: Secondary | ICD-10-CM | POA: Diagnosis not present

## 2017-08-09 DIAGNOSIS — I482 Chronic atrial fibrillation: Secondary | ICD-10-CM | POA: Diagnosis not present

## 2017-08-09 DIAGNOSIS — K5900 Constipation, unspecified: Secondary | ICD-10-CM | POA: Diagnosis not present

## 2017-08-09 DIAGNOSIS — S51011D Laceration without foreign body of right elbow, subsequent encounter: Secondary | ICD-10-CM | POA: Diagnosis not present

## 2017-08-09 DIAGNOSIS — I2510 Atherosclerotic heart disease of native coronary artery without angina pectoris: Secondary | ICD-10-CM | POA: Diagnosis not present

## 2017-08-09 DIAGNOSIS — I4891 Unspecified atrial fibrillation: Secondary | ICD-10-CM

## 2017-08-09 DIAGNOSIS — R41841 Cognitive communication deficit: Secondary | ICD-10-CM | POA: Diagnosis not present

## 2017-08-09 DIAGNOSIS — S32401S Unspecified fracture of right acetabulum, sequela: Secondary | ICD-10-CM | POA: Diagnosis not present

## 2017-08-09 DIAGNOSIS — W19XXXA Unspecified fall, initial encounter: Secondary | ICD-10-CM | POA: Diagnosis not present

## 2017-08-09 DIAGNOSIS — I7092 Chronic total occlusion of artery of the extremities: Secondary | ICD-10-CM | POA: Diagnosis not present

## 2017-08-09 DIAGNOSIS — S065X9A Traumatic subdural hemorrhage with loss of consciousness of unspecified duration, initial encounter: Secondary | ICD-10-CM | POA: Diagnosis not present

## 2017-08-09 DIAGNOSIS — D649 Anemia, unspecified: Secondary | ICD-10-CM | POA: Diagnosis not present

## 2017-08-09 DIAGNOSIS — I4892 Unspecified atrial flutter: Secondary | ICD-10-CM | POA: Diagnosis not present

## 2017-08-09 DIAGNOSIS — Z743 Need for continuous supervision: Secondary | ICD-10-CM | POA: Diagnosis not present

## 2017-08-09 DIAGNOSIS — S72001G Fracture of unspecified part of neck of right femur, subsequent encounter for closed fracture with delayed healing: Secondary | ICD-10-CM | POA: Diagnosis not present

## 2017-08-09 DIAGNOSIS — Z9181 History of falling: Secondary | ICD-10-CM | POA: Diagnosis not present

## 2017-08-09 DIAGNOSIS — I481 Persistent atrial fibrillation: Secondary | ICD-10-CM | POA: Diagnosis not present

## 2017-08-09 DIAGNOSIS — M79673 Pain in unspecified foot: Secondary | ICD-10-CM | POA: Diagnosis not present

## 2017-08-09 DIAGNOSIS — M6281 Muscle weakness (generalized): Secondary | ICD-10-CM | POA: Diagnosis not present

## 2017-08-09 DIAGNOSIS — S85001D Unspecified injury of popliteal artery, right leg, subsequent encounter: Secondary | ICD-10-CM | POA: Diagnosis not present

## 2017-08-09 DIAGNOSIS — I255 Ischemic cardiomyopathy: Secondary | ICD-10-CM | POA: Diagnosis not present

## 2017-08-09 DIAGNOSIS — J45998 Other asthma: Secondary | ICD-10-CM | POA: Diagnosis not present

## 2017-08-09 LAB — GLUCOSE, CAPILLARY
Glucose-Capillary: 93 mg/dL (ref 65–99)
Glucose-Capillary: 87 mg/dL (ref 65–99)

## 2017-08-09 LAB — CBC
HEMATOCRIT: 36.5 % — AB (ref 39.0–52.0)
HEMOGLOBIN: 12.2 g/dL — AB (ref 13.0–17.0)
MCH: 30.2 pg (ref 26.0–34.0)
MCHC: 33.4 g/dL (ref 30.0–36.0)
MCV: 90.3 fL (ref 78.0–100.0)
Platelets: 326 10*3/uL (ref 150–400)
RBC: 4.04 MIL/uL — AB (ref 4.22–5.81)
RDW: 16.2 % — ABNORMAL HIGH (ref 11.5–15.5)
WBC: 5 10*3/uL (ref 4.0–10.5)

## 2017-08-09 LAB — HEPARIN LEVEL (UNFRACTIONATED)
Heparin Unfractionated: 0.28 IU/mL — ABNORMAL LOW (ref 0.30–0.70)
Heparin Unfractionated: 0.28 IU/mL — ABNORMAL LOW (ref 0.30–0.70)
Heparin Unfractionated: 0.6 IU/mL (ref 0.30–0.70)

## 2017-08-09 MED ORDER — APIXABAN 5 MG PO TABS: 5.0000 mg | ORAL_TABLET | Freq: Two times a day (BID) | ORAL | Status: DC

## 2017-08-09 MED ORDER — OXYCODONE HCL 5 MG PO TABS: 5.0000 mg | ORAL_TABLET | ORAL | 0 refills | Status: DC | PRN

## 2017-08-09 MED ORDER — ACETAMINOPHEN 325 MG PO TABS: 650.0000 mg | ORAL_TABLET | Freq: Four times a day (QID) | ORAL | Status: DC | PRN

## 2017-08-09 MED ORDER — AMIODARONE HCL 200 MG PO TABS: 200.0000 mg | ORAL_TABLET | Freq: Every day | ORAL | Status: DC

## 2017-08-09 MED ORDER — APIXABAN 5 MG PO TABS
5.0000 mg | ORAL_TABLET | Freq: Two times a day (BID) | ORAL | Status: DC
  Administered 2017-08-09: 5 mg via ORAL
  Filled 2017-08-09: qty 1

## 2017-08-09 MED ORDER — AMIODARONE HCL 200 MG PO TABS
200.0000 mg | ORAL_TABLET | Freq: Every day | ORAL | Status: DC
  Administered 2017-08-09: 200 mg via ORAL
  Filled 2017-08-09: qty 1

## 2017-08-09 NOTE — Discharge Instructions (Signed)

## 2017-08-09 NOTE — Patient Outreach (Signed)
[facility]) Care Management  08/09/2017  Mayur Duman Matt Sep 03, 1926 [id number]  [facility] CSW made aware of patient's current hospitalization. Patient presented to ED yesterday with continued right hip pain and right foot/ankle pain. [facility] CSW will await for hospital discharge and then will follow up with [facility]. [facility] CSW has not received return call from SNF social worker yet. [facility] CSW will follow up with patient within two weeks and will continue to provide social work assistance and support during SNF stay.  [name], BSW, MSW, [email or web address] Phone: [phone] Fax: [phone]

## 2017-08-09 NOTE — Progress Notes (Signed)
[place] for apixaban  Indication: atrial fibrillation  Allergies  Allergen Reactions  . Keflex [Cephalexin] Nausea And Vomiting and Other (See Comments)    'Killed all GI enzymes" (also) and patient prefers to not take this  . Morphine Nausea Only    Patient Measurements: Height: 5\' 10"  (177.8 cm) Weight: 175 lb 14.8 oz (79.8 kg) IBW/kg (Calculated) : 73  Vital Signs: Temp: 97.6 F (36.4 C) (05/13 1100) Temp Source: Oral (05/13 1100) BP: 111/69 (05/13 1100) Pulse Rate: 88 (05/13 1100)  Labs: Recent Labs    08/08/17 1036 08/09/17 0209  HGB 13.6 12.2*  HCT 41.0 36.5*  PLT 370 326  HEPARINUNFRC  --  0.28*  0.28*  CREATININE 0.99  --     Estimated Creatinine Clearance: 52.2 mL/min (by C-G formula based on SCr of 0.99 mg/dL).  Assessment: 84 y.o. male with h/o Afib, Eliquis has been on hold since 4/29 s/p subdural hematoma subsequent to a fall. Currently on IV heparin. Per cardiology notes from last admission, team was amenable to restarting full dose apixaban (5 mg twice daily) given improvement in renal function and since SDH was secondary to fall. SCr remains stable at 0.99.   Goal of Therapy:  Stroke prevention Monitor platelets by anticoagulation protocol: Yes   Plan:  Stop IV heparin. Resume apixaban 5 mg twice daily Monitor closely for s/s of bleeding   Albertina Parr, PharmD., BCPS Clinical Pharmacist Clinical phone for 08/09/17 until 3:30pm: [phone] If after 3:30pm, please call main pharmacy at: [phone]

## 2017-08-09 NOTE — NC FL2 (Signed)
[place] LEVEL OF CARE SCREENING TOOL     IDENTIFICATION  Patient Name: Donald [name] Birthdate: October 04, 1926 Sex: male Admission Date (Current Location): 08/08/2017  [facility] and [place] Number:  Herbalist and Address:  [facility] [place] [address], [place], [place] [address]      Provider Number: [id number]  Attending Physician Name and Address:  Geradine Girt, DO  Relative Name and Phone Number:       Current Level of Care: Hospital Recommended Level of Care: [place] Prior Approval Number:    Date Approved/Denied:   PASRR Number: [id number]  Discharge Plan: SNF    Current Diagnoses: Patient Active Problem List   Diagnosis Date Noted  . 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 Closed fracture of right pelvis ([place]) 08/08/2017  . CAD (coronary artery disease) 08/08/2017  . Recent Traumatic subdural hemorrhage ([place]) 08/08/2017  . Chronic atrial fibrillation ([place]) 08/08/2017  . Chronic systolic heart failure ([place]) 08/08/2017  . Coronary artery disease/ history of CABG 08/08/2017  . Prolonged Q-T interval on ECG 08/08/2017  . Subdural hematoma ([place]) 07/26/2017  . Contusion of right hip   . Syncope 07/25/2017  . Traumatic subdural hematoma ([place]) 07/24/2017  . History of cardioversion 07/20/2017  . Persistent atrial fibrillation ([place])   . Chronic systolic CHF (congestive heart failure) ([place]) 07/13/2017  . Moderate to severe mitral regurgitation 06/16/2017  . Acute combined systolic and diastolic congestive heart failure ([place]) 06/16/2017  . New onset of congestive heart failure ([place]) 06/11/2017  . AKI (acute kidney injury) ([place]) 06/11/2017  . Normocytic normochromic anemia 06/11/2017  . OA (osteoarthritis) of knee 01/13/2017  . Encounter for therapeutic drug monitoring 05/10/2013  . Radiation proctitis 05/05/2013  . Colonic polyp 05/05/2013  . Chest pain 09/17/2011  . PAF (paroxysmal  atrial fibrillation) ([place]) 05/19/2010  . History of CVA (cerebrovascular accident) 03/30/2010  . MURAL THROMBUS, APEX OF HEART 09/18/2008  . HYPERLIPIDEMIA 09/14/2008  . CATARACTS 09/14/2008  . Coronary atherosclerosis 09/14/2008  . CARDIOMYOPATHY 09/14/2008  . CEREBROVASCULAR ACCIDENT 09/14/2008  . TRANSIENT ISCHEMIC ATTACK 09/14/2008  . ASTHMA 09/14/2008  . GUAIAC POSITIVE STOOL 09/14/2008    Orientation RESPIRATION BLADDER Height & Weight     Self, Time, Situation, Place  Normal Continent Weight: 175 lb 14.8 oz (79.8 kg) Height:  5\' 10"  (177.8 cm)  BEHAVIORAL SYMPTOMS/MOOD NEUROLOGICAL BOWEL NUTRITION STATUS      Continent Diet(heart room)  AMBULATORY STATUS COMMUNICATION OF NEEDS Skin   Extensive Assist Verbally Normal, Other (Comment)(fracture)                       Personal Care Assistance Level of Assistance    Bathing Assistance: Limited assistance Feeding assistance: Independent Dressing Assistance: Limited assistance     Functional Limitations Info  Sight, Hearing, Speech Sight Info: Adequate Hearing Info: Adequate Speech Info: Adequate    SPECIAL CARE FACTORS FREQUENCY        PT Frequency: 5x wk OT Frequency: 5x wk            Contractures Contractures Info: Not present    Additional Factors Info  Code Status, Allergies Code Status Info: DNR Allergies Info: keflex,cephalexin, morphine           Current Medications (08/09/2017):  This is the current hospital active medication list Current Facility-Administered Medications  Medication Dose Route Frequency Provider Last Rate Last Dose  . 0.9 %  sodium chloride  infusion  250 mL Intravenous PRN Samella Parr, NP      . acetaminophen (TYLENOL) tablet 650 mg  650 mg Oral Q6H PRN Samella Parr, NP       Or  . acetaminophen (TYLENOL) suppository 650 mg  650 mg Rectal Q6H PRN Samella Parr, NP      . amiodarone (PACERONE) tablet 200 mg  200 mg Oral Daily Lelon Perla, MD   200 mg at  08/09/17 0956  . apixaban (ELIQUIS) tablet 5 mg  5 mg Oral BID Lavenia Atlas, RPH      . cholecalciferol (VITAMIN D) tablet 2,000 Units  2,000 Units Oral Daily Samella Parr, NP   2,000 Units at 08/09/17 0956  . fluticasone furoate-vilanterol (BREO ELLIPTA) 100-25 MCG/INH 1 puff  1 puff Inhalation Daily PRN Samella Parr, NP      . latanoprost (XALATAN) 0.005 % ophthalmic solution 1 drop  1 drop Left Eye QHS Samella Parr, NP   1 drop at 08/08/17 2139  . loratadine (CLARITIN) tablet 10 mg  10 mg Oral Daily Samella Parr, NP   10 mg at 08/09/17 0956  . multivitamin with minerals tablet 1 tablet  1 tablet Oral Daily Samella Parr, NP   1 tablet at 08/09/17 0956  . oxyCODONE (Oxy IR/ROXICODONE) immediate release tablet 5-10 mg  5-10 mg Oral Q4H PRN Samella Parr, NP      . ranolazine (RANEXA) 12 hr tablet 500 mg  500 mg Oral BID Samella Parr, NP   500 mg at 08/09/17 0956  . senna (SENOKOT) tablet 8.6 mg  1 tablet Oral QHS Samella Parr, NP   8.6 mg at 08/08/17 2139  . sodium chloride flush (NS) 0.9 % injection 3 mL  3 mL Intravenous Q12H Samella Parr, NP   3 mL at 08/09/17 0957  . sodium chloride flush (NS) 0.9 % injection 3 mL  3 mL Intravenous Q12H Samella Parr, NP   3 mL at 08/09/17 0957  . sodium chloride flush (NS) 0.9 % injection 3 mL  3 mL Intravenous PRN Samella Parr, NP      . tamsulosin (FLOMAX) capsule 0.4 mg  0.4 mg Oral QPM Samella Parr, NP   0.4 mg at 08/08/17 1925     Discharge Medications: Please see discharge summary for a list of discharge medications.  Relevant Imaging Results:  Relevant Lab Results:   Additional Information SS#: [id number]  Wende Neighbors, LCSW

## 2017-08-09 NOTE — Progress Notes (Signed)
[place] for Heparin Indication: atrial fibrillation  Allergies  Allergen Reactions  . Keflex [Cephalexin] Nausea And Vomiting and Other (See Comments)    'Killed all GI enzymes" (also) and patient prefers to not take this  . Morphine Nausea Only    Patient Measurements: Height: 5\' 10"  (177.8 cm) Weight: 175 lb 14.8 oz (79.8 kg) IBW/kg (Calculated) : 73  Vital Signs: Temp: 97.9 F (36.6 C) (05/13 0231) Temp Source: Oral (05/13 0231) BP: 100/65 (05/13 0231) Pulse Rate: 87 (05/12 1932)  Labs: Recent Labs    08/08/17 1036 08/09/17 0209  HGB 13.6 12.2*  HCT 41.0 36.5*  PLT 370 326  HEPARINUNFRC  --  0.28*  0.28*  CREATININE 0.99  --     Estimated Creatinine Clearance: 52.2 mL/min (by C-G formula based on SCr of 0.99 mg/dL).  Assessment: 84 y.o. male with h/o Afib, Eliquis on hold s/p subdural hematoma 4/29, now with popliteal artery occlusion, for heparin  Goal of Therapy:  Heparin level 0.3-0.7 units/ml Monitor platelets by anticoagulation protocol: Yes   Plan:  Increase Heparin 1250 units/hr Check heparin level in 8 hours.   Caryl Pina 08/09/2017,3:22 AM

## 2017-08-09 NOTE — Progress Notes (Signed)
Clinical Social Worker facilitated patient discharge including contacting patient family and facility to confirm patient discharge plans.  Clinical information faxed to facility and family agreeable with plan.  CSW arranged ambulance transport via [facility] to [facility] .  RN to call [phone] (pt will go in room 611B) for report prior to discharge.  Clinical Social Worker will sign off for now as social work intervention is no longer needed. Please consult [place] again if new need arises.  Rhea Pink, MSW, [place]

## 2017-08-09 NOTE — Clinical Social Work Note (Signed)
Clinical Social Work Assessment  Patient Details  Name: Donald [name] MRN: [id number] Date of Birth: [birth year]  Date of referral:  08/09/17               Reason for consult:  Discharge Planning                Permission sought to share information with:  Facility Sport and exercise psychologist, Family Supports Permission granted to share information::  Yes, Verbal Permission Granted  Name::     [name]  Agency::  [facility]  Relationship::  Granddaughter  Contact Information:  [phone]  Housing/Transportation Living arrangements for the past 2 months:  [place] of Information:  Patient, Medical Team, Facility Patient Interpreter Needed:  None Criminal Activity/Legal Involvement Pertinent to Current Situation/Hospitalization:  No - Comment as needed Significant Relationships:  Adult Children, Other Family Members(Granddaughter) Lives with:  Self Do you feel safe going back to the place where you live?  Yes Need for family participation in patient care:  Yes (Comment)  Care giving concerns:  Patient is a short-term rehab resident at [facility].   Social Worker assessment / plan:  CSW met with patient. No supports at bedside. CSW introduced role and explained that PT recommendations would be discussed. Patient confirmed he was admitted from [facility] and plans to return there today. No further concerns. CSW encouraged patient to contact CSW as needed. CSW has arranged transport via [facility] and notified his granddaughter, per patient request.  Employment status:  Retired Forensic scientist:  Medicare PT Recommendations:  [place] / Referral to community resources:  [place]  Patient/Family's Response to care:  Patient agreeable to return to SNF today. Patient's family supportive and involved in patient's care. Patient appreciated social work intervention.  Patient/Family's Understanding of and Emotional  Response to Diagnosis, Current Treatment, and Prognosis:  Patient has a good understanding of the reason for admission and plan to return to SNF today. Patient appears happy with hospital care.  Emotional Assessment Appearance:  Appears stated age Attitude/Demeanor/Rapport:  Engaged, Gracious Affect (typically observed):  Accepting, Appropriate, Calm, Pleasant Orientation:  Oriented to Self, Oriented to Place, Oriented to  Time, Oriented to Situation Alcohol / Substance use:  Never Used Psych involvement (Current and /or in the community):  No (Comment)  Discharge Needs  Concerns to be addressed:  Care Coordination Readmission within the last 30 days:  Yes Current discharge risk:  Dependent with Mobility, Lives alone Barriers to Discharge:  No Barriers Identified   Candie Chroman, LCSW 08/09/2017, 2:25 PM

## 2017-08-09 NOTE — Discharge Summary (Addendum)
Physician Discharge Summary  Donald [name] WCB:[id number] DOB: [birth year] DOA: 08/08/2017  PCP: Haywood Pao, MD  Admit date: 08/08/2017 Discharge date: 08/09/2017   Recommendations for Outpatient Follow-Up:   Can consider cardioversion after 4 weeks of therapeutic anticoagulation. holding ARB and beta-blockade, Decrease amiodarone to 200 mg daily. Per Dr. Donzetta Matters: see him in a  few weeks with ABI check to ensure that he does not need any intervention on his right lower extremity. -monitor pulses on lower extremities Per orthopedics: Right acet fx -- Should heal well with non-operative management and NWB.  He should f/u with Dr. Griffin Basil in 2-4 weeks as an OP.      Discharge Diagnosis:   Principal Problem:   Right popliteal artery occlusion (HCC) Active Problems:   Closed fracture of right pelvis (HCC)   CAD (coronary artery disease)   Recent Traumatic subdural hemorrhage (HCC)   Chronic atrial fibrillation (HCC)   Chronic systolic heart failure (HCC)   History of CVA (cerebrovascular accident)   Coronary artery disease/ history of CABG   Prolonged Q-T interval on ECG   Discharge disposition:  SNF  Discharge Condition: Improved.  Diet recommendation: Low sodium, heart healthy  Wound care: None.   History of Present Illness:   84 year old with past medical history relevant for paroxysmal atrial fibrillation, coronary artery disease status post CABG, chronic systolic heart failure with EF of 40 to 45% by echo on 06/13/2017, prior CVA, prostate cancer who was admitted with severe right lower extremity numbness and pain that resolved.  In brief patient had difficult to control atrial fibrillation with RVR in April 2019 and had cardioversions x2 on 418 and 423.  He was then maintained on beta-blocker and amiodarone which were decreased due to his hypotension.  He then returned to the hospital after a fall on 07/24/2017 due to orthostasis thought to be secondary to his  diuretic usage and was noted to have a very small 4 mm subdural hematoma.  His anticoagulation at that time (rivaroxaban) was discontinued with plan to restart in 2 weeks if he did not have any further intracranial bleeding.  He was discharged to SNF on 07/28/2017 due to persistent right hip pain in the setting of this fall.  He presents today with waking up with severe right lower extremity numbness and pain primarily below the knee.  CTA of the lower extremity revealed occlusion of the right popliteal artery of indeterminate chronicity as well as right pelvic fracture which was not visible on plain films.     Hospital Course by Problem:    foot pain-resolved.  Right popliteal artery and proximal profunda femoris occlusion noted on CTA.  Seen by vascular surgery and felt not to have acute ischemic event -per vascular: will restart on Eliquis prior to discharge.  I will plan to see him in a few weeks with ABI check to ensure that he does not need any intervention on his right lower extremity.   acetabular fracture - per ortho:Should heal well with non-operative management and NWB. Ok to discharge from orthopedic standpoint. He should f/u with Dr. Griffin Basil in 2-4 weeks as an OP.    chronic systolic congestive heart failure-patient appears to be euvolemic.  PRN lasix  paroxysmal atrial fibrillation/flutter -h/o previous cardioversions but is now in atrial flutter versus ectopic atrial tachycardia. -per cards: Decrease amiodarone to 200 mg daily.  His recent traumatic subdural hematoma has resolved.  - CHADSvasc 4.   -Can consider cardioversion after 4 weeks of  therapeutic anticoagulation. -resume eliquis  ischemic cardiomyopathy- -problems with orthostasis and had an episode of orthostatic related syncope - holding ARB and beta-blockade-- resume as able  Coronary artery disease status post coronary artery bypass and graft- -statin     Medical Consultants:     Ortho  Vascular  cardiology   Discharge Exam:   Vitals:   08/09/17 0737 08/09/17 1100  BP: 113/78 111/69  Pulse: 83 88  Resp: 14 18  Temp: 97.8 F (36.6 C) 97.6 F (36.4 C)  SpO2: 97% 98%   Vitals:   08/08/17 2308 08/09/17 0231 08/09/17 0737 08/09/17 1100  BP: 120/75 100/65 113/78 111/69  Pulse:   83 88  Resp: 14 15 14 18   Temp: 97.6 F (36.4 C) 97.9 F (36.6 C) 97.8 F (36.6 C) 97.6 F (36.4 C)  TempSrc: Oral Oral Oral Oral  SpO2: 96%  97% 98%  Weight:  79.8 kg (175 lb 14.8 oz)    Height:        Gen:  NAD    The results of significant diagnostics from this hospitalization (including imaging, microbiology, ancillary and laboratory) are listed below for reference.     Procedures and Diagnostic Studies:   Dg Ankle Complete Right  Result Date: 08/08/2017 CLINICAL DATA:  RIGHT leg pain. Fell 2 weeks ago and is unable to bear weight on the leg due to hip pain from previous fall. RIGHT LATERAL ankle pain radiates up the leg. Symptoms started this morning. EXAM: RIGHT ANKLE - COMPLETE 3+ VIEW COMPARISON:  Ankle CT 11/02/2016 FINDINGS: No acute fracture or subluxation. Small plantar and Achilles calcaneal spurs are present. There is dense atherosclerotic calcification of the small vessels. Surgical clips are noted in the distal MEDIAL LOWER leg. IMPRESSION: No evidence for acute  abnormality. Electronically Signed   By: Nolon Nations M.D.   On: 08/08/2017 10:27   Ct Head Wo Contrast  Result Date: 08/08/2017 CLINICAL DATA:  Ct head wo. Patient had fall secondary to dizziness 2 weeks ago EXAM: CT HEAD WITHOUT CONTRAST TECHNIQUE: Contiguous axial images were obtained from the base of the skull through the vertex without intravenous contrast. COMPARISON:  CT 07/25/2017 and 07/24/2017 FINDINGS: Brain: There has been complete resolution of RIGHT subdural hematoma. There is moderate central and cortical atrophy. There is encephalomalacia in the LEFT posterior parietal lobe  consistent with remote infarct. There is no intra or extra-axial fluid collection or mass lesion. The basilar cisterns and ventricles have a normal appearance. There is no CT evidence for acute infarction or hemorrhage. There is residual intravascular contrast following CT of the RIGHT LOWER extremity on the same day. Vascular: Significant atherosclerotic calcification of the carotid and vertebral arteries. Skull: No acute fracture. Sinuses/Orbits: No acute finding. Other: RIGHT frontal scalp edema/hematoma has improved significantly. IMPRESSION: 1. Interval resolution of RIGHT subdural hematoma and improving RIGHT scalp edema/hematoma. 2.  No evidence for acute intracranial abnormality. 3. Remote LEFT parietal infarct appears stable. Electronically Signed   By: Nolon Nations M.D.   On: 08/08/2017 16:20   Ct Angio Low Extrem Right W &/or Wo Contrast  Result Date: 08/08/2017 CLINICAL DATA:  84 year old male with a history of claudication. Concern for vascular disease. EXAM: CT OF THE LOWER RIGHT EXTREMITY WITH CONTRAST TECHNIQUE: Multidetector CT imaging of the lower right extremity was performed according to the standard protocol following intravenous contrast administration. COMPARISON:  None. CONTRAST:  130mL ISOVUE-370 IOPAMIDOL (ISOVUE-370) INJECTION 76% FINDINGS: Vascular: Aorta: Visualized aorta without aneurysm. Mild atherosclerotic changes. Right  lower extremity: Mild a moderate atherosclerotic changes of the right iliac system. No aneurysm or dissection. No stenosis or occlusion. Hypogastric arteries patent including anterior and posterior division. Mild tortuosity. Mild atherosclerotic changes of the common femoral artery. Profunda femoris is patent at the origin. Just beyond the origin there is occlusion of thigh branches, with no comparison. Mild atherosclerotic changes of the superficial femoral artery. There is occlusion of the popliteal artery in the P1 segment. No significant contrast within  the distal popliteal artery or the tibial vessels. Tibial calcifications in all 3 tibial arteries. Nonvascular: Unremarkable appearance of the visualized small bowel and colon. No inflammatory changes within the abdomen. No lymphadenopathy visualized. Diameter of the prostate measures 4.5 cm Unremarkable appearance of the urinary bladder. Musculoskeletal: Redemonstration of right pelvic fracture/right acetabular fracture better characterized on the dedicated musculoskeletal pelvic CT. Surgical changes of right knee medial compartment resurfacing. IMPRESSION: Occlusion of the popliteal artery and the P1 segment, as well as the proximal profunda femoris, indeterminate chronicity. Correlation with the patient's presentation and right lower extremity symptoms is recommended. Atherosclerotic calcifications of the distal aorta and the right iliac artery without significant stenosis by CT imaging. Calcifications of the right tibial arteries. No significant contrast opacification, which may indicate late arrival of the contrast bolus versus thrombosis. Redemonstration of right pelvic fracture, better characterized on the dedicated musculoskeletal CT of today's date. These results were called by telephone at the time of interpretation on 08/08/2017 at 2:57 pm to Dr. Isla Pence , who verbally acknowledged these results. Signed, Dulcy Fanny. Earleen Newport, DO [facility] Electronically Signed   By: Corrie Mckusick D.O.   On: 08/08/2017 14:57   Ct No Charge  Result Date: 08/08/2017 CLINICAL DATA:  Right hip pain since a fall 2 weeks ago. EXAM: CT ADDITIONAL VIEWS AT NO CHARGE TECHNIQUE: Axial coronal and sagittal reformatted images centered about the right hip are provided based on CT angiogram of the right lower extremity this same day. CONTRAST:  100 cc Isovue 370. COMPARISON:  None. FINDINGS: The patient has an acute nondisplaced fracture of the right acetabulum. Fracture  lines are seen through the anterior and medial walls of the acetabulum and across the periphery of the acetabular roof. Incomplete and nondisplaced fracture line in the high superior right pubic ramus is seen. No other acute fracture is identified. Remote healed left inferior pubic ramus fracture is noted. The right femoral head is located. Lower lumbar spondylosis is noted. IMPRESSION: Nondisplaced fracture of the anterior and medial walls and roof of the right acetabulum as described above. No other acute abnormality. Electronically Signed   By: Inge Rise M.D.   On: 08/08/2017 14:26     Labs:   Basic Metabolic Panel: Recent Labs  Lab 08/08/17 1036  NA 137  K 4.2  CL 105  CO2 26  GLUCOSE 103*  BUN 16  CREATININE 0.99  CALCIUM 9.3   GFR Estimated Creatinine Clearance: 52.2 mL/min (by C-G formula based on SCr of 0.99 mg/dL). Liver Function Tests: No results for input(s): AST, ALT, ALKPHOS, BILITOT, PROT, ALBUMIN in the last 168 hours. No results for input(s): LIPASE, AMYLASE in the last 168 hours. No results for input(s): AMMONIA in the last 168 hours. Coagulation profile No results for input(s): INR, PROTIME in the last 168 hours.  CBC: Recent Labs  Lab 08/08/17 1036 08/09/17 0209  WBC 5.9 5.0  NEUTROABS 4.8  --   HGB 13.6 12.2*  HCT 41.0 36.5*  MCV 89.9 90.3  PLT 370 326   Cardiac Enzymes: No results for input(s): CKTOTAL, CKMB, CKMBINDEX, TROPONINI in the last 168 hours. BNP: Invalid input(s): POCBNP CBG: Recent Labs  Lab 08/09/17 0755 08/09/17 1241  GLUCAP 93 87   D-Dimer No results for input(s): DDIMER in the last 72 hours. Hgb A1c No results for input(s): HGBA1C in the last 72 hours. Lipid Profile No results for input(s): CHOL, HDL, LDLCALC, TRIG, CHOLHDL, LDLDIRECT in the last 72 hours. Thyroid function studies No results for input(s): TSH, T4TOTAL, T3FREE, THYROIDAB in the last 72 hours.  Invalid input(s): FREET3 Anemia work up No results  for input(s): VITAMINB12, FOLATE, FERRITIN, TIBC, IRON, RETICCTPCT in the last 72 hours. Microbiology No results found for this or any previous visit (from the past 240 hour(s)).   Discharge Instructions:   Discharge Instructions    Diet - low sodium heart healthy   Complete by:  As directed    Increase activity slowly   Complete by:  As directed      Allergies as of 08/09/2017      Reactions   Keflex [cephalexin] Nausea And Vomiting, Other (See Comments)   'Killed all GI enzymes" (also) and patient prefers to not take this   Morphine Nausea Only      Medication List    TAKE these medications   acetaminophen 325 MG tablet Commonly known as:  TYLENOL Take 2 tablets (650 mg total) by mouth every 6 (six) hours as needed for mild pain (or Fever >/= 101).   amiodarone 200 MG tablet Commonly known as:  PACERONE Take 1 tablet (200 mg total) by mouth daily. Start taking on:  08/10/2017 What changed:    how much to take  when to take this   apixaban 5 MG Tabs tablet Commonly known as:  ELIQUIS Take 1 tablet (5 mg total) by mouth 2 (two) times daily.   BREO ELLIPTA 100-25 MCG/INH Aepb Generic drug:  fluticasone furoate-vilanterol Inhale 1 puff into the lungs daily as needed (shortness of breath).   FLOMAX 0.4 MG Caps capsule Generic drug:  tamsulosin Take 0.4 mg by mouth every evening.   furosemide 40 MG tablet Commonly known as:  LASIX Take 1 tablet (40 mg total) by mouth daily as needed for fluid or edema.   hydrocortisone cream 1 % Apply 1 application topically 3 (three) times daily as needed for itching (skin irritation).   latanoprost 0.005 % ophthalmic solution Commonly known as:  XALATAN Place 1 drop into the left eye at bedtime.   loratadine 10 MG tablet Commonly known as:  CLARITIN Take 10 mg by mouth daily.   multivitamin with minerals Tabs tablet Take 1 tablet by mouth daily.   oxyCODONE 5 MG immediate release tablet Commonly known as:  Oxy  IR/ROXICODONE Take 1-2 tablets (5-10 mg total) by mouth every 4 (four) hours as needed for moderate pain.   ranolazine 500 MG 12 hr tablet Commonly known as:  RANEXA Take 1 tablet (500 mg total) by mouth 2 (two) times daily.   senna 8.6 MG Tabs tablet Commonly known as:  SENOKOT Take 1 tablet by mouth at bedtime.   Vitamin D 2000 units tablet Take 2,000 Units by mouth daily.      Follow-up Information    Hiram Gash, MD. Schedule an appointment as soon as possible for a visit in 3 week(s).   Specialty:  Orthopedic Surgery Contact information: [address] [place] [address] [phone]  Waynetta Sandy, MD Follow up in 2 week(s).   Specialties:  Vascular Surgery, Cardiology Why:  for ABI Contact information: [place] [address] [phone]        [name], [name], MD Follow up in 1 week(s).   Specialty:  Internal Medicine Contact information: [place] [address] [phone]        Lelon Perla, MD .   Specialty:  Cardiology Contact information: [address] [place] [address] [phone]            Time coordinating discharge: 35 min  Signed:  Geradine Girt   [facility] 08/09/2017, 12:59 PM

## 2017-08-09 NOTE — Care Management CC44 (Signed)
Condition Code 44 Documentation Completed  Patient Details  Name: Donald [name] MRN: [id number] Date of Birth: [birth year]   Condition Code 44 given:  Yes Patient signature on Condition Code 44 notice:  Yes Documentation of 2 MD's agreement:  Yes Code 44 added to claim:  Yes    Maryclare Labrador, RN 08/09/2017, 2:50 PM

## 2017-08-09 NOTE — Progress Notes (Signed)
Pt to be d/c back to [facility] per MD order, report called to receiving RN all questions answered, pt VSS, pt and family verbalized understanding

## 2017-08-09 NOTE — Care Management Obs Status (Signed)
[facility] NOTIFICATION   Patient Details  Name: Donald [name] MRN: [id number] Date of Birth: [birth year]   Medicare Observation Status Notification Given:  Yes    Maryclare Labrador, RN 08/09/2017, 2:50 PM

## 2017-08-09 NOTE — Evaluation (Signed)
Physical Therapy Evaluation Patient Details Name: Donald [name] MRN: [id number] DOB: 1926/12/04 Today's Date: 08/09/2017   History of Present Illness  84 year old with past medical history relevant for paroxysmal atrial fibrillation, coronary artery disease status post CABG, chronic systolic heart failure with EF of 40 to 45% by echo on 06/13/2017, prior CVA, prostate cancer who was admitted with severe right lower extremity numbness and pain. Pt found to have R acetabular fracture. R LE NWB.  Clinical Impression  Pt admitted with above. Pt able to tolerate R LE NWB. Pt will use w/c as primary mobility. Acute PT to con't to follow.    Follow Up Recommendations SNF;Supervision for mobility/OOB    Equipment Recommendations  Rolling walker with 5" wheels;3in1 (PT)    Recommendations for Other Services       Precautions / Restrictions Precautions Precautions: Fall Precaution Comments: R acetabular hip fracture Restrictions Weight Bearing Restrictions: No      Mobility  Bed Mobility Overal bed mobility: Needs Assistance Bed Mobility: Supine to Sit     Supine to sit: Min guard     General bed mobility comments: increased time, pt able to manage LEs to EOB, used bed rail to pull self  to EOB  Transfers Overall transfer level: Needs assistance Equipment used: Rolling walker (2 wheeled) Transfers: Sit to/from [facility] Sit to Stand: Min guard Stand pivot transfers: Min guard       General transfer comment: v/c's to maintain R LE NWB. pt foot down however pt states "'im not putting any weight on it, i'm putting it all through my arms" v/c's to push up from bed, min guard to steady during transition of hands. once in standing with bialt UE support pt able to lift R LE without difficulty  Ambulation/Gait             General Gait Details: did not attempt as pt reports "i use a w/c at [facility] because I can't hop"  Stairs            Wheelchair  Mobility    Modified Rankin (Stroke Patients Only)       Balance Overall balance assessment: Needs assistance   Sitting balance-Leahy Scale: Good     Standing balance support: Bilateral upper extremity supported Standing balance-Leahy Scale: Poor Standing balance comment: dependent on RW                              Pertinent Vitals/Pain Pain Assessment: 0-10 Pain Score: 0-No pain Pain Location: R hip pain 10/10 at standing    Home Living Family/patient expects to be discharged to:: Skilled nursing facility                 Additional Comments: was living at home alone up until 2 weeks ago when he fell visiting someone at [facility], has been at [facility] for a wekk    Prior Function Level of Independence: Needs assistance   Gait / Transfers Assistance Needed: has needed the w/c due to R LE pain due to fracture  ADL's / Homemaking Assistance Needed: RN staff assist        Hand Dominance   Dominant Hand: Right    Extremity/Trunk Assessment   Upper Extremity Assessment Upper Extremity Assessment: Generalized weakness    Lower Extremity Assessment Lower Extremity Assessment: RLE deficits/detail RLE Deficits / Details: able to complete quad set, glut squeeze and initial hip flexion via heel slide but  not SLR       Communication   Communication: No difficulties  Cognition Arousal/Alertness: Awake/alert Behavior During Therapy: WFL for tasks assessed/performed Overall Cognitive Status: Within Functional Limits for tasks assessed                                        General Comments      Exercises General Exercises - Lower Extremity Ankle Circles/Pumps: AROM;Both;10 reps;Supine Quad Sets: AROM;Both;10 reps;Supine Gluteal Sets: AROM;Both;10 reps;Supine Long Arc Quad: AROM;Seated;Both   Assessment/Plan    PT Assessment Patient needs continued PT services  PT Problem List Decreased strength;Decreased range of  motion;Decreased activity tolerance;Decreased balance;Pain;Decreased mobility       PT Treatment Interventions DME instruction;Gait training;Therapeutic activities;Functional mobility training;Therapeutic exercise;Patient/family education    PT Goals (Current goals can be found in the Care Plan section)  Acute Rehab PT Goals Patient Stated Goal: let my hip heal PT Goal Formulation: With patient Time For Goal Achievement: 08/23/17 Potential to Achieve Goals: Good Additional Goals Additional Goal #1: Pt indep with w/c propulsion or using bilat LEs to propel self.    Frequency Min 2X/week   Barriers to discharge        Co-evaluation               AM-PAC PT "6 Clicks" Daily Activity  Outcome Measure Difficulty turning over in bed (including adjusting bedclothes, sheets and blankets)?: A Little Difficulty moving from lying on back to sitting on the side of the bed? : A Little Difficulty sitting down on and standing up from a chair with arms (e.g., wheelchair, bedside commode, etc,.)?: A Little Help needed moving to and from a bed to chair (including a wheelchair)?: A Little Help needed walking in hospital room?: Total Help needed climbing 3-5 steps with a railing? : Total 6 Click Score: 14    End of Session Equipment Utilized During Treatment: Gait belt Activity Tolerance: Patient limited by pain Patient left: in chair;with call bell/phone within reach Nurse Communication: Mobility status PT Visit Diagnosis: Pain;Difficulty in walking, not elsewhere classified (R26.2) Pain - Right/Left: Right Pain - part of body: Hip    Time: 1001-1025 PT Time Calculation (min) (ACUTE ONLY): 24 min   Charges:   PT Evaluation $PT Eval Moderate Complexity: 1 Mod PT Treatments $Therapeutic Activity: 8-22 mins   PT G Codes:        Kittie Plater, PT, DPT Pager #: [phone] Office #: [phone]   [place] 08/09/2017, 1:18 PM

## 2017-08-09 NOTE — Consult Note (Signed)
Reason for Consult:Right acet fx Referring Physician: Azucena Fallen Gadea is an 84 y.o. male.  HPI: Wil suffered a syncopal episode on 4/27 of this year and fell. He had severe right hip pain but x-rays were negative and no further imaging was pursued. He was discharged to a SNF for further rehab. He then suffered an acute popliteal thrombus and was brought back to the ED. CTA incidentally showed a right acetabular fx that wasn't evident on plain x-rays and orthopedic surgery was consulted. He notes 10/10 pain with WB but is comfortable otherwise.  Past Medical History:  Diagnosis Date  . Asthma    with worsening with beta blockade  . Atrial fibrillation ([place])   . CAD (coronary artery disease)   . CVA (cerebral infarction) 2012   tia, mild no residual defecits  . GI bleed 8 yrs ago   due to doll fully vessel which was clipped  . Glaucoma    excellent cataracts  . History of kidney stones   . Ischemic cardiomyopathy   . Moderate to severe mitral regurgitation 06/16/2017  . Nephrolithiasis   . 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 Prostate cancer ([place]) 2012  . Radiation proctitis Feb 2015   treated with APC  . Tubular adenoma 04/2013   Dr. Hilarie Fredrickson    Past Surgical History:  Procedure Laterality Date  . cardiac stents  2003  . CARDIOVERSION N/A 06/15/2017   Procedure: CARDIOVERSION;  Surgeon: Acie Fredrickson, Wonda Cheng, MD;  Location: [place];  Service: Cardiovascular;  Laterality: N/A;  . CARDIOVERSION N/A 07/15/2017   Procedure: CARDIOVERSION;  Surgeon: Jerline Pain, MD;  Location: [facility] ENDOSCOPY;  Service: Cardiovascular;  Laterality: N/A;  . CARDIOVERSION N/A 07/20/2017   Procedure: CARDIOVERSION;  Surgeon: Herminio Commons, MD;  Location: [facility] ENDOSCOPY;  Service: Cardiovascular;  Laterality: N/A;  . COLONOSCOPY WITH PROPOFOL N/A 05/05/2013   Procedure: COLONOSCOPY WITH PROPOFOL;  Surgeon: Jerene Bears, MD;  Location: [facility] ENDOSCOPY;  Service: Gastroenterology;  Laterality:  N/A;  . CORONARY ARTERY BYPASS GRAFT  1998   LIMA to the LAD and saphenous vein graft tothe diagonal  . EXTRACORPOREAL SHOCK WAVE LITHOTRIPSY Right 04/29/2017   Procedure: RIGHT EXTRACORPOREAL SHOCK WAVE LITHOTRIPSY (ESWL);  Surgeon: Alexis Frock, MD;  Location: [facility];  Service: Urology;  Laterality: Right;  . history of radiation treatment  2012   x 40 treatments done  . KNEE ARTHROSCOPY  2016 or 2017   Dr Gladstone Lighter ;   . PARTIAL KNEE ARTHROPLASTY Right 01/13/2017   Procedure: UNICOMPARTMENTAL RIGHT KNEE;  Surgeon: Gaynelle Arabian, MD;  Location: [facility];  Service: Orthopedics;  Laterality: Right;  with block    Family History  Problem Relation Age of Onset  . Heart attack Father   . Heart disease Mother   . Stomach cancer Mother     Social History:  reports that he has never smoked. He has never used smokeless tobacco. He reports that he drinks alcohol. He reports that he does not use drugs.  Allergies:  Allergies  Allergen Reactions  . Keflex [Cephalexin] Nausea And Vomiting and Other (See Comments)    'Killed all GI enzymes" (also) and patient prefers to not take this  . Morphine Nausea Only    Medications: I have reviewed the patient's current medications.  Results for orders placed or performed during the hospital encounter of 08/08/17 (from the past 48 hour(s))  CBC with Differential     Status: Abnormal   Collection Time: 08/08/17  10:36 AM  Result Value Ref Range   WBC 5.9 4.0 - 10.5 K/uL   RBC 4.56 4.22 - 5.81 MIL/uL   Hemoglobin 13.6 13.0 - 17.0 g/dL   HCT 41.0 39.0 - 52.0 %   MCV 89.9 78.0 - 100.0 fL   MCH 29.8 26.0 - 34.0 pg   MCHC 33.2 30.0 - 36.0 g/dL   RDW 16.0 (H) 11.5 - 15.5 %   Platelets 370 150 - 400 K/uL   Neutrophils Relative % 81 %   Neutro Abs 4.8 1.7 - 7.7 K/uL   Lymphocytes Relative 11 %   Lymphs Abs 0.6 (L) 0.7 - 4.0 K/uL   Monocytes Relative 7 %   Monocytes Absolute 0.4 0.1 - 1.0 K/uL   Eosinophils Relative 1 %   Eosinophils Absolute 0.1  0.0 - 0.7 K/uL   Basophils Relative 0 %   Basophils Absolute 0.0 0.0 - 0.1 K/uL    Comment: Performed at [facility] [address], [place], [place] [address]  Basic metabolic panel     Status: Abnormal   Collection Time: 08/08/17 10:36 AM  Result Value Ref Range   Sodium 137 135 - 145 mmol/L   Potassium 4.2 3.5 - 5.1 mmol/L   Chloride 105 101 - 111 mmol/L   CO2 26 22 - 32 mmol/L   Glucose, Bld 103 (H) 65 - 99 mg/dL   BUN 16 6 - 20 mg/dL   Creatinine, Ser 0.99 0.61 - 1.24 mg/dL   Calcium 9.3 8.9 - 10.3 mg/dL   GFR calc non Af Amer >60 >60 mL/min   GFR calc Af Amer >60 >60 mL/min    Comment: (NOTE) The eGFR has been calculated using the CKD EPI equation. This calculation has not been validated in all clinical situations. eGFR's persistently <60 mL/min signify possible Chronic Kidney Disease.    Anion gap 6 5 - 15    Comment: Performed at [facility] [address] [place], [place] [address]  I-Stat Troponin, ED (not at [facility])     Status: None   Collection Time: 08/08/17  4:20 PM  Result Value Ref Range   Troponin i, poc 0.00 0.00 - 0.08 ng/mL   Comment 3            Comment: Due to the release kinetics of cTnI, a negative result within the first hours of the onset of symptoms does not rule out myocardial infarction with certainty. If myocardial infarction is still suspected, repeat the test at appropriate intervals.   Heparin level (unfractionated)     Status: Abnormal   Collection Time: 08/09/17  2:09 AM  Result Value Ref Range   Heparin Unfractionated 0.28 (L) 0.30 - 0.70 IU/mL    Comment:        IF HEPARIN RESULTS ARE BELOW EXPECTED VALUES, AND PATIENT DOSAGE HAS BEEN CONFIRMED, SUGGEST FOLLOW UP TESTING OF ANTITHROMBIN III LEVELS. Performed at [facility], [place] [address] [place], [place] [address]   CBC     Status: Abnormal   Collection Time: 08/09/17  2:09 AM  Result Value Ref Range   WBC 5.0 4.0 - 10.5 K/uL   RBC 4.04 (L) 4.22 - 5.81  MIL/uL   Hemoglobin 12.2 (L) 13.0 - 17.0 g/dL   HCT 36.5 (L) 39.0 - 52.0 %   MCV 90.3 78.0 - 100.0 fL   MCH 30.2 26.0 - 34.0 pg   MCHC 33.4 30.0 - 36.0 g/dL   RDW 16.2 (H)  11.5 - 15.5 %   Platelets 326 150 - 400 K/uL    Comment: Performed at [facility], [place] [address] [place], [place] [address]  Heparin level (unfractionated)     Status: Abnormal   Collection Time: 08/09/17  2:09 AM  Result Value Ref Range   Heparin Unfractionated 0.28 (L) 0.30 - 0.70 IU/mL    Comment:        IF HEPARIN RESULTS ARE BELOW EXPECTED VALUES, AND PATIENT DOSAGE HAS BEEN CONFIRMED, SUGGEST FOLLOW UP TESTING OF ANTITHROMBIN III LEVELS. Performed at [facility], [place] [address], [place], [place] [address]   Glucose, capillary     Status: None   Collection Time: 08/09/17  7:55 AM  Result Value Ref Range   Glucose-Capillary 93 65 - 99 mg/dL   Comment 1 Notify RN    Comment 2 Document in Chart     Dg Ankle Complete Right  Result Date: 08/08/2017 CLINICAL DATA:  RIGHT leg pain. Fell 2 weeks ago and is unable to bear weight on the leg due to hip pain from previous fall. RIGHT LATERAL ankle pain radiates up the leg. Symptoms started this morning. EXAM: RIGHT ANKLE - COMPLETE 3+ VIEW COMPARISON:  Ankle CT 11/02/2016 FINDINGS: No acute fracture or subluxation. Small plantar and Achilles calcaneal spurs are present. There is dense atherosclerotic calcification of the small vessels. Surgical clips are noted in the distal MEDIAL LOWER leg. IMPRESSION: No evidence for acute  abnormality. Electronically Signed   By: Nolon Nations M.D.   On: 08/08/2017 10:27   Ct Head Wo Contrast  Result Date: 08/08/2017 CLINICAL DATA:  Ct head wo. Patient had fall secondary to dizziness 2 weeks ago EXAM: CT HEAD WITHOUT CONTRAST TECHNIQUE: Contiguous axial images were obtained from the base of the skull through the vertex without intravenous contrast. COMPARISON:  CT 07/25/2017 and 07/24/2017 FINDINGS: Brain: There  has been complete resolution of RIGHT subdural hematoma. There is moderate central and cortical atrophy. There is encephalomalacia in the LEFT posterior parietal lobe consistent with remote infarct. There is no intra or extra-axial fluid collection or mass lesion. The basilar cisterns and ventricles have a normal appearance. There is no CT evidence for acute infarction or hemorrhage. There is residual intravascular contrast following CT of the RIGHT LOWER extremity on the same day. Vascular: Significant atherosclerotic calcification of the carotid and vertebral arteries. Skull: No acute fracture. Sinuses/Orbits: No acute finding. Other: RIGHT frontal scalp edema/hematoma has improved significantly. IMPRESSION: 1. Interval resolution of RIGHT subdural hematoma and improving RIGHT scalp edema/hematoma. 2.  No evidence for acute intracranial abnormality. 3. Remote LEFT parietal infarct appears stable. Electronically Signed   By: Nolon Nations M.D.   On: 08/08/2017 16:20   Ct Angio Low Extrem Right W &/or Wo Contrast  Result Date: 08/08/2017 CLINICAL DATA:  84 year old male with a history of claudication. Concern for vascular disease. EXAM: CT OF THE LOWER RIGHT EXTREMITY WITH CONTRAST TECHNIQUE: Multidetector CT imaging of the lower right extremity was performed according to the standard protocol following intravenous contrast administration. COMPARISON:  None. CONTRAST:  128m ISOVUE-370 IOPAMIDOL (ISOVUE-370) INJECTION 76% FINDINGS: Vascular: Aorta: Visualized aorta without aneurysm. Mild atherosclerotic changes. Right lower extremity: Mild a moderate atherosclerotic changes of the right iliac system. No aneurysm or dissection. No stenosis or occlusion. Hypogastric arteries patent including anterior and posterior division. Mild tortuosity. Mild atherosclerotic changes of the common femoral artery. Profunda femoris is patent at the origin. Just beyond the origin there is occlusion  of thigh branches, with no  comparison. Mild atherosclerotic changes of the superficial femoral artery. There is occlusion of the popliteal artery in the P1 segment. No significant contrast within the distal popliteal artery or the tibial vessels. Tibial calcifications in all 3 tibial arteries. Nonvascular: Unremarkable appearance of the visualized small bowel and colon. No inflammatory changes within the abdomen. No lymphadenopathy visualized. Diameter of the prostate measures 4.5 cm Unremarkable appearance of the urinary bladder. Musculoskeletal: Redemonstration of right pelvic fracture/right acetabular fracture better characterized on the dedicated musculoskeletal pelvic CT. Surgical changes of right knee medial compartment resurfacing. IMPRESSION: Occlusion of the popliteal artery and the P1 segment, as well as the proximal profunda femoris, indeterminate chronicity. Correlation with the patient's presentation and right lower extremity symptoms is recommended. Atherosclerotic calcifications of the distal aorta and the right iliac artery without significant stenosis by CT imaging. Calcifications of the right tibial arteries. No significant contrast opacification, which may indicate late arrival of the contrast bolus versus thrombosis. Redemonstration of right pelvic fracture, better characterized on the dedicated musculoskeletal CT of today's date. These results were called by telephone at the time of interpretation on 08/08/2017 at 2:57 pm to Dr. Isla Pence , who verbally acknowledged these results. Signed, Dulcy Fanny. Earleen Newport, DO [facility] Radiology Electronically Signed   By: Corrie Mckusick D.O.   On: 08/08/2017 14:57   Ct No Charge  Result Date: 08/08/2017 CLINICAL DATA:  Right hip pain since a fall 2 weeks ago. EXAM: CT ADDITIONAL VIEWS AT NO CHARGE TECHNIQUE: Axial coronal and sagittal reformatted images centered about the right hip are provided based on CT angiogram of the right  lower extremity this same day. CONTRAST:  100 cc Isovue 370. COMPARISON:  None. FINDINGS: The patient has an acute nondisplaced fracture of the right acetabulum. Fracture lines are seen through the anterior and medial walls of the acetabulum and across the periphery of the acetabular roof. Incomplete and nondisplaced fracture line in the high superior right pubic ramus is seen. No other acute fracture is identified. Remote healed left inferior pubic ramus fracture is noted. The right femoral head is located. Lower lumbar spondylosis is noted. IMPRESSION: Nondisplaced fracture of the anterior and medial walls and roof of the right acetabulum as described above. No other acute abnormality. Electronically Signed   By: Inge Rise M.D.   On: 08/08/2017 14:26    Review of Systems  Constitutional: Negative for weight loss.  HENT: Negative for ear discharge, ear pain, hearing loss and tinnitus.   Eyes: Negative for blurred vision, double vision, photophobia and pain.  Respiratory: Negative for cough, sputum production and shortness of breath.   Cardiovascular: Negative for chest pain.  Gastrointestinal: Negative for abdominal pain, nausea and vomiting.  Genitourinary: Negative for dysuria, flank pain, frequency and urgency.  Musculoskeletal: Positive for joint pain (Right hip). Negative for back pain, falls, myalgias and neck pain.  Neurological: Negative for dizziness, tingling, sensory change, focal weakness, loss of consciousness and headaches.  Endo/Heme/Allergies: Does not bruise/bleed easily.  Psychiatric/Behavioral: Negative for depression, memory loss and substance abuse. The patient is not nervous/anxious.    Blood pressure 113/78, pulse 83, temperature 97.8 F (36.6 C), temperature source Oral, resp. rate 14, height '5\' 10"'$  (1.778 m), weight 79.8 kg (175 lb 14.8 oz), SpO2 97 %. Physical Exam  Constitutional: He appears well-developed and well-nourished. No distress.  HENT:  Head:  Normocephalic and atraumatic.  Eyes: Conjunctivae are normal. Right eye exhibits no discharge. Left  eye exhibits no discharge. No scleral icterus.  Neck: Normal range of motion.  Cardiovascular: Normal rate and regular rhythm.  Respiratory: Effort normal. No respiratory distress.  Musculoskeletal:  RLE No traumatic wounds or rash, extensive ecchymoses lateral proximal thigh  Minimal TTP, no pain with AROM/PROM  No knee or ankle effusion  Knee stable to varus/ valgus and anterior/posterior stress  Sens DPN, SPN, TN intact  Motor EHL, ext, flex, evers 5/5  DP 1+, PT 0, No significant edema  Neurological: He is alert.  Skin: Skin is warm and dry. He is not diaphoretic.  Psychiatric: He has a normal mood and affect. His behavior is normal.    Assessment/Plan: Fall Right acet fx -- Should heal well with non-operative management and NWB. Ok to discharge from orthopedic standpoint. He should f/u with Dr. Griffin Basil in 2-4 weeks as an OP.  Right popliteal artery thrombosis -- Anticoagulated Afib CAD s/p CABG CHF Hx/o CVA Prostate Ca    Lisette Abu, PA-C Orthopedic Surgery [phone] 08/09/2017, 9:31 AM

## 2017-08-09 NOTE — Progress Notes (Signed)
Progress Note  Patient Name: JASIRI HANAWALT Date of Encounter: 08/09/2017  Primary Cardiologist: Kirk Ruths, MD   Subjective   No chest pain or dyspnea; some hip pain  Inpatient Medications    Scheduled Meds: . amiodarone  400 mg Oral BID  . cholecalciferol  2,000 Units Oral Daily  . latanoprost  1 drop Left Eye QHS  . loratadine  10 mg Oral Daily  . multivitamin with minerals  1 tablet Oral Daily  . ranolazine  500 mg Oral BID  . senna  1 tablet Oral QHS  . sodium chloride flush  3 mL Intravenous Q12H  . sodium chloride flush  3 mL Intravenous Q12H  . tamsulosin  0.4 mg Oral QPM   Continuous Infusions: . sodium chloride    . heparin 1,250 Units/hr (08/09/17 0328)   PRN Meds: sodium chloride, acetaminophen **OR** acetaminophen, fluticasone furoate-vilanterol, oxyCODONE, sodium chloride flush   Vital Signs    Vitals:   08/08/17 1800 08/08/17 1932 08/08/17 2308 08/09/17 0231  BP: 123/69 127/78 120/75 100/65  Pulse: 86 87    Resp: 15 16 14 15   Temp: 98.6 F (37 C) 98.3 F (36.8 C) 97.6 F (36.4 C) 97.9 F (36.6 C)  TempSrc:  Oral Oral Oral  SpO2: 98% 96% 96%   Weight: 173 lb 11.6 oz (78.8 kg) 173 lb 15.1 oz (78.9 kg)  175 lb 14.8 oz (79.8 kg)  Height: 5\' 10"  (1.778 m) 5\' 10"  (1.778 m)      Intake/Output Summary (Last 24 hours) at 08/09/2017 0712 Last data filed at 08/09/2017 0600 Gross per 24 hour  Intake 124.25 ml  Output 425 ml  Net -300.75 ml   Filed Weights   08/08/17 1800 08/08/17 1932 08/09/17 0231  Weight: 173 lb 11.6 oz (78.8 kg) 173 lb 15.1 oz (78.9 kg) 175 lb 14.8 oz (79.8 kg)    Telemetry    Atrial flutter vs EAT with controlled ventricular response - Personally Reviewed   Physical Exam   GEN: No acute distress.   Neck: No JVD Cardiac: irregular Respiratory: Clear to auscultation bilaterally. GI: Soft, nontender, non-distended  MS: No edema Neuro:  Nonfocal  Psych: Normal affect   Labs    Chemistry Recent Labs  Lab  08/08/17 1036  NA 137  K 4.2  CL 105  CO2 26  GLUCOSE 103*  BUN 16  CREATININE 0.99  CALCIUM 9.3  GFRNONAA >60  GFRAA >60  ANIONGAP 6     Hematology Recent Labs  Lab 08/08/17 1036 08/09/17 0209  WBC 5.9 5.0  RBC 4.56 4.04*  HGB 13.6 12.2*  HCT 41.0 36.5*  MCV 89.9 90.3  MCH 29.8 30.2  MCHC 33.2 33.4  RDW 16.0* 16.2*  PLT 370 326    Recent Labs  Lab 08/08/17 1620  TROPIPOC 0.00      Radiology    Dg Ankle Complete Right  Result Date: 08/08/2017 CLINICAL DATA:  RIGHT leg pain. Fell 2 weeks ago and is unable to bear weight on the leg due to hip pain from previous fall. RIGHT LATERAL ankle pain radiates up the leg. Symptoms started this morning. EXAM: RIGHT ANKLE - COMPLETE 3+ VIEW COMPARISON:  Ankle CT 11/02/2016 FINDINGS: No acute fracture or subluxation. Small plantar and Achilles calcaneal spurs are present. There is dense atherosclerotic calcification of the small vessels. Surgical clips are noted in the distal MEDIAL LOWER leg. IMPRESSION: No evidence for acute  abnormality. Electronically Signed   By: Nolon Nations M.D.  On: 08/08/2017 10:27   Ct Head Wo Contrast  Result Date: 08/08/2017 CLINICAL DATA:  Ct head wo. Patient had fall secondary to dizziness 2 weeks ago EXAM: CT HEAD WITHOUT CONTRAST TECHNIQUE: Contiguous axial images were obtained from the base of the skull through the vertex without intravenous contrast. COMPARISON:  CT 07/25/2017 and 07/24/2017 FINDINGS: Brain: There has been complete resolution of RIGHT subdural hematoma. There is moderate central and cortical atrophy. There is encephalomalacia in the LEFT posterior parietal lobe consistent with remote infarct. There is no intra or extra-axial fluid collection or mass lesion. The basilar cisterns and ventricles have a normal appearance. There is no CT evidence for acute infarction or hemorrhage. There is residual intravascular contrast following CT of the RIGHT LOWER extremity on the same day.  Vascular: Significant atherosclerotic calcification of the carotid and vertebral arteries. Skull: No acute fracture. Sinuses/Orbits: No acute finding. Other: RIGHT frontal scalp edema/hematoma has improved significantly. IMPRESSION: 1. Interval resolution of RIGHT subdural hematoma and improving RIGHT scalp edema/hematoma. 2.  No evidence for acute intracranial abnormality. 3. Remote LEFT parietal infarct appears stable. Electronically Signed   By: Nolon Nations M.D.   On: 08/08/2017 16:20   Ct Angio Low Extrem Right W &/or Wo Contrast  Result Date: 08/08/2017 CLINICAL DATA:  84 year old male with a history of claudication. Concern for vascular disease. EXAM: CT OF THE LOWER RIGHT EXTREMITY WITH CONTRAST TECHNIQUE: Multidetector CT imaging of the lower right extremity was performed according to the standard protocol following intravenous contrast administration. COMPARISON:  None. CONTRAST:  150mL ISOVUE-370 IOPAMIDOL (ISOVUE-370) INJECTION 76% FINDINGS: Vascular: Aorta: Visualized aorta without aneurysm. Mild atherosclerotic changes. Right lower extremity: Mild a moderate atherosclerotic changes of the right iliac system. No aneurysm or dissection. No stenosis or occlusion. Hypogastric arteries patent including anterior and posterior division. Mild tortuosity. Mild atherosclerotic changes of the common femoral artery. Profunda femoris is patent at the origin. Just beyond the origin there is occlusion of thigh branches, with no comparison. Mild atherosclerotic changes of the superficial femoral artery. There is occlusion of the popliteal artery in the P1 segment. No significant contrast within the distal popliteal artery or the tibial vessels. Tibial calcifications in all 3 tibial arteries. Nonvascular: Unremarkable appearance of the visualized small bowel and colon. No inflammatory changes within the abdomen. No lymphadenopathy visualized. Diameter of the prostate measures 4.5 cm Unremarkable appearance of  the urinary bladder. Musculoskeletal: Redemonstration of right pelvic fracture/right acetabular fracture better characterized on the dedicated musculoskeletal pelvic CT. Surgical changes of right knee medial compartment resurfacing. IMPRESSION: Occlusion of the popliteal artery and the P1 segment, as well as the proximal profunda femoris, indeterminate chronicity. Correlation with the patient's presentation and right lower extremity symptoms is recommended. Atherosclerotic calcifications of the distal aorta and the right iliac artery without significant stenosis by CT imaging. Calcifications of the right tibial arteries. No significant contrast opacification, which may indicate late arrival of the contrast bolus versus thrombosis. Redemonstration of right pelvic fracture, better characterized on the dedicated musculoskeletal CT of today's date. These results were called by telephone at the time of interpretation on 08/08/2017 at 2:57 pm to Dr. Isla Pence , who verbally acknowledged these results. Signed, Dulcy Fanny. Earleen Newport, DO [facility] Electronically Signed   By: Corrie Mckusick D.O.   On: 08/08/2017 14:57   Ct No Charge  Result Date: 08/08/2017 CLINICAL DATA:  Right hip pain since a fall 2 weeks ago. EXAM: CT ADDITIONAL VIEWS AT NO CHARGE  TECHNIQUE: Axial coronal and sagittal reformatted images centered about the right hip are provided based on CT angiogram of the right lower extremity this same day. CONTRAST:  100 cc Isovue 370. COMPARISON:  None. FINDINGS: The patient has an acute nondisplaced fracture of the right acetabulum. Fracture lines are seen through the anterior and medial walls of the acetabulum and across the periphery of the acetabular roof. Incomplete and nondisplaced fracture line in the high superior right pubic ramus is seen. No other acute fracture is identified. Remote healed left inferior pubic ramus fracture is noted. The right  femoral head is located. Lower lumbar spondylosis is noted. IMPRESSION: Nondisplaced fracture of the anterior and medial walls and roof of the right acetabulum as described above. No other acute abnormality. Electronically Signed   By: Inge Rise M.D.   On: 08/08/2017 14:26     Patient Profile     84 y.o. male with past medical history of coronary artery disease status post coronary artery bypass and graft, ischemic cardiomyopathy, paroxysmal atrial fibrillation/flutter, prior CVA, recent fall with traumatic subdural hematoma now resolved admitted with lower extremity pain now resolved.  Also with acetabular fracture.  Cardiology asked to evaluate for management of anticoagulation.  Assessment & Plan    1 paroxysmal atrial fibrillation/flutter-patient has had previous cardioversions but is now in atrial flutter versus ectopic atrial tachycardia.  Ventricular response is controlled.  Decrease amiodarone to 200 mg daily.  His recent traumatic subdural hematoma has resolved.  He is now on IV heparin.  We will transition to apixaban once it is clear all procedures complete. CHADSvasc 4.  Can consider cardioversion after 4 weeks of therapeutic anticoagulation.  2 ischemic cardiomyopathy-patient has had problems with orthostasis and had an episode of orthostatic related syncope.  We are therefore holding ARB and beta-blockade.    3 Coronary artery disease status post coronary artery bypass and graft-we are not treating with aspirin given need for anticoagulation and recent subdural hematoma.  Resume statin.  4 foot pain-resolved.  Right popliteal artery and proximal profunda femoris occlusion noted on CTA.  Seen by vascular surgery and felt not to have acute ischemic event. May require arteriogram.  5 acetabular fracture-Per orthopedics.  6 chronic systolic congestive heart failure-patient appears to be euvolemic.  Follow clinically and diurese as needed.  For questions or updates, please  contact [place] Please consult [email or web address] for contact info under Cardiology/STEMI.      Signed, Kirk Ruths, MD  08/09/2017, 7:12 AM

## 2017-08-09 NOTE — Progress Notes (Signed)
  Progress Note    08/09/2017 7:37 AM * No surgery found *  Subjective: Complains only of right hip pain  Vitals:   08/08/17 2308 08/09/17 0231  BP: 120/75 100/65  Pulse:    Resp: 14 15  Temp: 97.6 F (36.4 C) 97.9 F (36.6 C)  SpO2: 96%     Physical Exam: Awake alert and oriented Nonlabored respirations Abdomen is soft Bilateral femoral pulses are palpable Left popliteal pulses palpable Dorsalis pedis on the right is stronger today Strong posterior tibial on the left Both feet are warm and appear well-perfused.  CBC    Component Value Date/Time   WBC 5.0 08/09/2017 0209   RBC 4.04 (L) 08/09/2017 0209   HGB 12.2 (L) 08/09/2017 0209   HCT 36.5 (L) 08/09/2017 0209   PLT 326 08/09/2017 0209   MCV 90.3 08/09/2017 0209   MCH 30.2 08/09/2017 0209   MCHC 33.4 08/09/2017 0209   RDW 16.2 (H) 08/09/2017 0209   LYMPHSABS 0.6 (L) 08/08/2017 1036   MONOABS 0.4 08/08/2017 1036   EOSABS 0.1 08/08/2017 1036   BASOSABS 0.0 08/08/2017 1036    BMET    Component Value Date/Time   NA 137 08/08/2017 1036   NA 144 07/05/2017 1502   K 4.2 08/08/2017 1036   CL 105 08/08/2017 1036   CO2 26 08/08/2017 1036   GLUCOSE 103 (H) 08/08/2017 1036   BUN 16 08/08/2017 1036   BUN 32 (H) 07/05/2017 1502   CREATININE 0.99 08/08/2017 1036   CREATININE 0.92 03/11/2016 0954   CALCIUM 9.3 08/08/2017 1036   GFRNONAA >60 08/08/2017 1036   GFRAA >60 08/08/2017 1036    INR    Component Value Date/Time   INR 1.33 07/26/2017 0220   INR 2.3 05/28/2010 0803     Intake/Output Summary (Last 24 hours) at 08/09/2017 0737 Last data filed at 08/09/2017 0600 Gross per 24 hour  Intake 124.25 ml  Output 425 ml  Net -300.75 ml     Assessment:  84 y.o. male is s/p here with paroxysmal atrial fibrillation now on heparin drip.  He did have a complaint of right foot pain and numbness but this resolved quickly without intervention.  His chief complaint now is hip pain and he is followed by  orthopedics.  Plan: Given that he does not have any pain in both feet appear well-perfused and CT angios demonstrates chronic disease we will not pursue intervention particularly in light of need for anticoagulation and ongoing hip pain.  It is possible that he has had embolic disease from his atrial fibrillation while he was off anticoagulation but if this is the case he is now perfusing his feet well he has no sensorimotor deficit.  He will restart on Eliquis prior to discharge.  I will plan to see him in a few weeks with ABI check to ensure that he does not need any intervention on his right lower extremity.   Dynver Clemson C. Donzetta Matters, MD [facility] [place] Office: [phone] Pager: [phone]  08/09/2017 7:37 AM

## 2017-08-10 ENCOUNTER — Telehealth: Payer: Self-pay | Admitting: Vascular Surgery

## 2017-08-10 ENCOUNTER — Other Ambulatory Visit: Payer: Self-pay | Admitting: Licensed Clinical Social Worker

## 2017-08-10 DIAGNOSIS — I255 Ischemic cardiomyopathy: Secondary | ICD-10-CM | POA: Diagnosis not present

## 2017-08-10 DIAGNOSIS — S32411B Displaced fracture of anterior wall of right acetabulum, initial encounter for open fracture: Secondary | ICD-10-CM | POA: Diagnosis not present

## 2017-08-10 DIAGNOSIS — H409 Unspecified glaucoma: Secondary | ICD-10-CM | POA: Diagnosis not present

## 2017-08-10 DIAGNOSIS — J45998 Other asthma: Secondary | ICD-10-CM | POA: Diagnosis not present

## 2017-08-10 DIAGNOSIS — C679 Malignant neoplasm of bladder, unspecified: Secondary | ICD-10-CM | POA: Diagnosis not present

## 2017-08-10 DIAGNOSIS — J449 Chronic obstructive pulmonary disease, unspecified: Secondary | ICD-10-CM | POA: Diagnosis not present

## 2017-08-10 DIAGNOSIS — I5022 Chronic systolic (congestive) heart failure: Secondary | ICD-10-CM | POA: Diagnosis not present

## 2017-08-10 DIAGNOSIS — M6281 Muscle weakness (generalized): Secondary | ICD-10-CM | POA: Diagnosis not present

## 2017-08-10 DIAGNOSIS — M79673 Pain in unspecified foot: Secondary | ICD-10-CM | POA: Diagnosis not present

## 2017-08-10 DIAGNOSIS — K5900 Constipation, unspecified: Secondary | ICD-10-CM | POA: Diagnosis not present

## 2017-08-10 DIAGNOSIS — I480 Paroxysmal atrial fibrillation: Secondary | ICD-10-CM | POA: Diagnosis not present

## 2017-08-10 DIAGNOSIS — I2510 Atherosclerotic heart disease of native coronary artery without angina pectoris: Secondary | ICD-10-CM | POA: Diagnosis not present

## 2017-08-10 NOTE — Telephone Encounter (Signed)
-----   Message from Penni Homans, RN sent at 08/10/2017  9:15 AM EDT ----- Regarding: Appointment   ----- Message ----- From: Waynetta Sandy, MD Sent: 08/09/2017   4:34 PM To: [address]  Donald [name] [id number] Jul 29, 1926   Inpatient level 1 Dx: pad  F/u in 3-4 week with ABI

## 2017-08-10 NOTE — Patient Outreach (Signed)
[facility]) Care Management  08/10/2017  Hans Rusher Arceneaux [birth year] [id number]  Assessment- [facility] CSW arrived at [facility] SN[facility] to complete [facility] Consult after patinet's recent hospitalization. Patient was in his room when [facility] CSW arrived. Patient reports that he was wondering why he was not seeing any change with PT therapy after 6 days and is glad that he went to the ED because they were able to find a fracture. Patient shares that he will probably be at SNF another month longer because he is unable to bear any weight on his right leg for 4-5 weeks. Patient reports that his discharge plan remains the same other than it being extended out further. Patient shares that his daughter will be staying with him for a few weeks once he does final discharge back home from [facility]. Patient reports an improvement in pain since he is not bearing weight. He shares that his appetite remains "good." Patient agreeable to allow [facility] CSW to continue to follow him during his SNF stay.  [facility] CSW went to SNF social worker's office but she was not there. [facility] CSW will contact facility during next outreach.  Plan-[facility] CSW will follow up within two - three weeks.   [name], BSW, MSW, [email or web address] Phone: [phone] Fax: [phone]

## 2017-08-10 NOTE — Telephone Encounter (Signed)
Sched appt 09/10/17; lab at 3:00 and MD 3:45. Lm on hm# to inform pt of appt.

## 2017-08-11 ENCOUNTER — Ambulatory Visit (HOSPITAL_COMMUNITY): Payer: Medicare Other | Admitting: Nurse Practitioner

## 2017-08-11 ENCOUNTER — Other Ambulatory Visit: Payer: Self-pay

## 2017-08-11 DIAGNOSIS — I739 Peripheral vascular disease, unspecified: Secondary | ICD-10-CM

## 2017-08-12 DIAGNOSIS — H409 Unspecified glaucoma: Secondary | ICD-10-CM | POA: Diagnosis not present

## 2017-08-12 DIAGNOSIS — K5900 Constipation, unspecified: Secondary | ICD-10-CM | POA: Diagnosis not present

## 2017-08-12 DIAGNOSIS — M79673 Pain in unspecified foot: Secondary | ICD-10-CM | POA: Diagnosis not present

## 2017-08-12 DIAGNOSIS — I480 Paroxysmal atrial fibrillation: Secondary | ICD-10-CM | POA: Diagnosis not present

## 2017-08-12 DIAGNOSIS — I5022 Chronic systolic (congestive) heart failure: Secondary | ICD-10-CM | POA: Diagnosis not present

## 2017-08-12 DIAGNOSIS — I255 Ischemic cardiomyopathy: Secondary | ICD-10-CM | POA: Diagnosis not present

## 2017-08-12 DIAGNOSIS — M6281 Muscle weakness (generalized): Secondary | ICD-10-CM | POA: Diagnosis not present

## 2017-08-12 DIAGNOSIS — S32411B Displaced fracture of anterior wall of right acetabulum, initial encounter for open fracture: Secondary | ICD-10-CM | POA: Diagnosis not present

## 2017-08-12 DIAGNOSIS — C679 Malignant neoplasm of bladder, unspecified: Secondary | ICD-10-CM | POA: Diagnosis not present

## 2017-08-12 DIAGNOSIS — J45998 Other asthma: Secondary | ICD-10-CM | POA: Diagnosis not present

## 2017-08-12 DIAGNOSIS — J449 Chronic obstructive pulmonary disease, unspecified: Secondary | ICD-10-CM | POA: Diagnosis not present

## 2017-08-12 DIAGNOSIS — I2510 Atherosclerotic heart disease of native coronary artery without angina pectoris: Secondary | ICD-10-CM | POA: Diagnosis not present

## 2017-08-16 DIAGNOSIS — I480 Paroxysmal atrial fibrillation: Secondary | ICD-10-CM | POA: Diagnosis not present

## 2017-08-16 DIAGNOSIS — K5900 Constipation, unspecified: Secondary | ICD-10-CM | POA: Diagnosis not present

## 2017-08-16 DIAGNOSIS — M6281 Muscle weakness (generalized): Secondary | ICD-10-CM | POA: Diagnosis not present

## 2017-08-16 DIAGNOSIS — J449 Chronic obstructive pulmonary disease, unspecified: Secondary | ICD-10-CM | POA: Diagnosis not present

## 2017-08-16 DIAGNOSIS — I255 Ischemic cardiomyopathy: Secondary | ICD-10-CM | POA: Diagnosis not present

## 2017-08-16 DIAGNOSIS — S32411B Displaced fracture of anterior wall of right acetabulum, initial encounter for open fracture: Secondary | ICD-10-CM | POA: Diagnosis not present

## 2017-08-16 DIAGNOSIS — J3089 Other allergic rhinitis: Secondary | ICD-10-CM | POA: Diagnosis not present

## 2017-08-16 DIAGNOSIS — H409 Unspecified glaucoma: Secondary | ICD-10-CM | POA: Diagnosis not present

## 2017-08-16 DIAGNOSIS — M79673 Pain in unspecified foot: Secondary | ICD-10-CM | POA: Diagnosis not present

## 2017-08-16 DIAGNOSIS — C679 Malignant neoplasm of bladder, unspecified: Secondary | ICD-10-CM | POA: Diagnosis not present

## 2017-08-16 DIAGNOSIS — J45998 Other asthma: Secondary | ICD-10-CM | POA: Diagnosis not present

## 2017-08-16 DIAGNOSIS — I5022 Chronic systolic (congestive) heart failure: Secondary | ICD-10-CM | POA: Diagnosis not present

## 2017-08-17 DIAGNOSIS — J449 Chronic obstructive pulmonary disease, unspecified: Secondary | ICD-10-CM | POA: Diagnosis not present

## 2017-08-17 DIAGNOSIS — K5900 Constipation, unspecified: Secondary | ICD-10-CM | POA: Diagnosis not present

## 2017-08-17 DIAGNOSIS — S32411B Displaced fracture of anterior wall of right acetabulum, initial encounter for open fracture: Secondary | ICD-10-CM | POA: Diagnosis not present

## 2017-08-17 DIAGNOSIS — M79673 Pain in unspecified foot: Secondary | ICD-10-CM | POA: Diagnosis not present

## 2017-08-17 DIAGNOSIS — C679 Malignant neoplasm of bladder, unspecified: Secondary | ICD-10-CM | POA: Diagnosis not present

## 2017-08-17 DIAGNOSIS — M6281 Muscle weakness (generalized): Secondary | ICD-10-CM | POA: Diagnosis not present

## 2017-08-17 DIAGNOSIS — H409 Unspecified glaucoma: Secondary | ICD-10-CM | POA: Diagnosis not present

## 2017-08-17 DIAGNOSIS — I5022 Chronic systolic (congestive) heart failure: Secondary | ICD-10-CM | POA: Diagnosis not present

## 2017-08-17 DIAGNOSIS — I480 Paroxysmal atrial fibrillation: Secondary | ICD-10-CM | POA: Diagnosis not present

## 2017-08-17 DIAGNOSIS — S065X0A Traumatic subdural hemorrhage without loss of consciousness, initial encounter: Secondary | ICD-10-CM | POA: Diagnosis not present

## 2017-08-17 DIAGNOSIS — I7092 Chronic total occlusion of artery of the extremities: Secondary | ICD-10-CM | POA: Diagnosis not present

## 2017-08-17 DIAGNOSIS — W19XXXA Unspecified fall, initial encounter: Secondary | ICD-10-CM | POA: Diagnosis not present

## 2017-08-19 ENCOUNTER — Other Ambulatory Visit: Payer: Self-pay | Admitting: Licensed Clinical Social Worker

## 2017-08-19 NOTE — Patient Outreach (Signed)
[facility]) Care Management  08/19/2017  Donald [name] May 26, 1926 [id number]  Assessment- [facility] CSW completed call to [facility] and left a message with front desk voice mail box requesting a return call from SNF social worker in order to gain discharge updates on patient.  Plan-[facility] CSW will continue to follow patient and await for SNF discharge.  [name], BSW, MSW, [email or web address] Phone: [phone] Fax: [phone]

## 2017-08-24 DIAGNOSIS — I7092 Chronic total occlusion of artery of the extremities: Secondary | ICD-10-CM | POA: Diagnosis not present

## 2017-08-24 DIAGNOSIS — H409 Unspecified glaucoma: Secondary | ICD-10-CM | POA: Diagnosis not present

## 2017-08-24 DIAGNOSIS — W19XXXA Unspecified fall, initial encounter: Secondary | ICD-10-CM | POA: Diagnosis not present

## 2017-08-24 DIAGNOSIS — M6281 Muscle weakness (generalized): Secondary | ICD-10-CM | POA: Diagnosis not present

## 2017-08-24 DIAGNOSIS — C679 Malignant neoplasm of bladder, unspecified: Secondary | ICD-10-CM | POA: Diagnosis not present

## 2017-08-24 DIAGNOSIS — S065X0A Traumatic subdural hemorrhage without loss of consciousness, initial encounter: Secondary | ICD-10-CM | POA: Diagnosis not present

## 2017-08-24 DIAGNOSIS — I480 Paroxysmal atrial fibrillation: Secondary | ICD-10-CM | POA: Diagnosis not present

## 2017-08-24 DIAGNOSIS — M79673 Pain in unspecified foot: Secondary | ICD-10-CM | POA: Diagnosis not present

## 2017-08-24 DIAGNOSIS — J449 Chronic obstructive pulmonary disease, unspecified: Secondary | ICD-10-CM | POA: Diagnosis not present

## 2017-08-24 DIAGNOSIS — I5022 Chronic systolic (congestive) heart failure: Secondary | ICD-10-CM | POA: Diagnosis not present

## 2017-08-24 DIAGNOSIS — I2510 Atherosclerotic heart disease of native coronary artery without angina pectoris: Secondary | ICD-10-CM | POA: Diagnosis not present

## 2017-08-24 DIAGNOSIS — S32411B Displaced fracture of anterior wall of right acetabulum, initial encounter for open fracture: Secondary | ICD-10-CM | POA: Diagnosis not present

## 2017-08-26 ENCOUNTER — Other Ambulatory Visit: Payer: Self-pay | Admitting: Licensed Clinical Social Worker

## 2017-08-26 NOTE — Patient Outreach (Signed)
[facility]) Care Management  08/26/2017  Donald [name] 1926-11-13 [id number]  [facility] CSW arrived at [facility] in order to complete [facility] Consult. [facility] CSW successfully met with patient. HIPPA verifications provided. Patient has moved into a private room now across the hall from where he was. Patient shares "I still have another 4 to 6 weeks of needed recovery time before I think I will be ready to go home since I live alone." Patient is still not able to bear weight on his right leg yet but is working hard in PT and OT on upper body strength and exercises. Patient reports to be doing well and has no complaints whatsoever. [facility] CSW will follow up with patient within 2-3 weeks.   [name], BSW, MSW, [email or web address] Phone: [phone] Fax: [phone]

## 2017-08-31 ENCOUNTER — Other Ambulatory Visit: Payer: Self-pay | Admitting: Licensed Clinical Social Worker

## 2017-08-31 NOTE — Patient Outreach (Signed)
[facility]) Care Management  08/31/2017  Donald [name] Apr 13, 1926 [id number]  Assessment- [facility] CSW arrived at [facility] and met with SNF social worker Earnest Bailey who has been out of the office and has now returned. Earnest Bailey states that patient has a set discharge date as of now to 09/07/17. Patient has been using his wheelchair but was able to walk 20 feet. SNF social worker reported that patient foot propels himself in wheelchair. No orders have been made as of now. [facility] CSW went to patient's room but was not in room. [facility] CSW unable to find patient at facility.  Plan-[facility] CSW will follow up with [facility] social worker within one week to gain final discharge updates.  [name], BSW, MSW, [email or web address] Phone: [phone] Fax: [phone]

## 2017-09-01 ENCOUNTER — Encounter: Payer: Self-pay | Admitting: Physician Assistant

## 2017-09-01 ENCOUNTER — Ambulatory Visit (INDEPENDENT_AMBULATORY_CARE_PROVIDER_SITE_OTHER): Payer: Medicare Other | Admitting: Physician Assistant

## 2017-09-01 VITALS — BP 110/78 | HR 94 | Ht 70.0 in | Wt 174.8 lb

## 2017-09-01 DIAGNOSIS — I340 Nonrheumatic mitral (valve) insufficiency: Secondary | ICD-10-CM | POA: Diagnosis not present

## 2017-09-01 DIAGNOSIS — S065XAA Traumatic subdural hemorrhage with loss of consciousness status unknown, initial encounter: Secondary | ICD-10-CM

## 2017-09-01 DIAGNOSIS — I25810 Atherosclerosis of coronary artery bypass graft(s) without angina pectoris: Secondary | ICD-10-CM

## 2017-09-01 DIAGNOSIS — Z8673 Personal history of transient ischemic attack (TIA), and cerebral infarction without residual deficits: Secondary | ICD-10-CM

## 2017-09-01 DIAGNOSIS — S065X9A Traumatic subdural hemorrhage with loss of consciousness of unspecified duration, initial encounter: Secondary | ICD-10-CM

## 2017-09-01 DIAGNOSIS — I70201 Unspecified atherosclerosis of native arteries of extremities, right leg: Secondary | ICD-10-CM

## 2017-09-01 DIAGNOSIS — S32401S Unspecified fracture of right acetabulum, sequela: Secondary | ICD-10-CM | POA: Diagnosis not present

## 2017-09-01 DIAGNOSIS — I5022 Chronic systolic (congestive) heart failure: Secondary | ICD-10-CM

## 2017-09-01 DIAGNOSIS — I481 Persistent atrial fibrillation: Secondary | ICD-10-CM

## 2017-09-01 DIAGNOSIS — I4819 Other persistent atrial fibrillation: Secondary | ICD-10-CM

## 2017-09-01 NOTE — Patient Instructions (Signed)
Medication Instructions:  INCREASE Lasix to 40mg  take TODAY AND TOMORROW ONLY THEN AS NEEDED  GOAL IS DRY WEIGHT OF 170  Labwork: None  Testing/Procedures: None  Follow-Up: Keep follow up appointment with Dr [name] as scheduled.  Any Other Special Instructions Will Be Listed Below (If Applicable).  If you need a refill on your cardiac medications before your next appointment, please call your pharmacy.

## 2017-09-01 NOTE — Progress Notes (Signed)
Cardiology Office Note    Date:  09/03/2017   ID:  Donald [name], DOB 05-Sep-1926, MRN [id number]  PCP:  Donald Pao, MD  Cardiologist:  Dr. Stanford [name]   Chief Complaint  Patient presents with  . Hospitalization Follow-up    discuss any changes per patient,     History of Present Illness:  Donald [name] is a 84 y.o. male with PMH of CAD s/p CABG1998, h/o ICM with improved EF, prior CVA, prior mural thrombus, prostate CA, persistent atrial fibrillation on eliquis and h/o GI bleed.Carotid Doppler 2005 showed mild disease.Prior abdominal ultrasound in May 2006show no aneurysm. He had a cardiac catheterization in June 2013 that showed 100%ostLAD, distal LAD filled with RV branch off of RCA, PLA possibly occluded by most distal RCA appears to be filled from LIMA collateral, LIMA patent but does not supply distal LAD. Medical therapy was recommended. Echocardiogram obtained on 11/13/2013 showed EF 50-55%, akinesis of the entire apical myocardium, grade 1 DD, PA peak pressure 56 mmHg. Ejection fraction has improved from the previous 40-45% in 2007. He is intolerant to all beta blockers.  Myoview obtained on 11/02/2016 showed EF 46%, defect in the distal anterior, distal inferior, apical wall consistent with small regional scar, inferior thinning consistent with soft tissue attenuation. Otherwise no ischemia.  He was evaluated on 06/08/2017 for shortness of breath and noted to be in new atrial fibrillation with a heart rate of 130.  He was also found to have some degree of heart failure.  Due to worsening dyspnea on exertion, he was eventually admitted for persistent atrial fibrillation and new onset of heart failure.  Echocardiogram obtained on 06/13/2017 showed EF 40-45%, mild aortic stenosis, mild LVH, akinesis of the apical myocardium, mild aortic root dilatation, moderate to severe MR, moderate TR. He was placed on Eliquis and underwent cardioversion on 06/15/2017.  The plan is to repeat  echocardiogram once sinus rhythm is reestablished, if we feel mitral regurgitation is contributing to heart failure in the future, may consider referral for mitral clip.  Otherwise given his age and prior CABG, would like to be conservative if possible.  He was eventually discharged with Lasix 40 mg daily.  His discharge weight was 183 pounds.  I last saw the patient on 06/28/2017, he appears to be volume overloaded, I increased his Lasix to 40 mg twice daily for 2 days before dropping back down to 60 mg daily thereafter.  He was seen in the A. fib clinic again on 07/08/2017, at which time he gained another 3 pounds over the course of 2 days, he was instructed to increase Lasix to 40 mg twice daily for 2 days again before dropping back down to 60 mg daily thereafter.  When he was seen back in the A. fib clinic on 4/16, his breathing was not getting better, he also had increased abdominal girth.  His weight was also increasing.  Creatinine went up to 2.0.  He was directly admitted to the cardiology service.  He was treated for acute heart failure and underwent another DC cardioversion on 4/18.  Due to recurrence of atrial fibrillation, he underwent DC cardioversion again on 4/23.  Amiodarone was increased to 400 mg twice daily.  Ranexa 500 mg twice daily was added.  He was discharge home on 40 mg twice daily of Lasix.  Shortly after discharge, he returned on 07/24/2017 after he passed out due to dizziness.  He struck the forehead and the right hip.  He was  found to have a 4 mm acute subdural hematoma on the CT scan.  Systemic anticoagulation was temporarily held, with plan to consider restarting eliquis if he does not have any further fall in 2 weeks.  He return to the ED on 08/08/2017 for right lower extremity pain, ultrasound showed right popliteal artery occlusion, although there was no sign of critical limb ischemia with distal signals by Doppler.  It was recommended to restart anticoagulation if okay by neurology.   He also had finding of nondisplaced fracture of the anterior and the medial wall of the right acetabulum.  He has been restarted on Eliquis since.  Patient presents today for cardiology office visit.  He is actually on Eliquis 5 mg twice daily.  He has been doing good since his release.  He still cannot bear weight on the right side.  Otherwise he denies any bleeding issue.  His lung is clear on physical exam.  His new dry weight is 170 pound, however today's weight is 174 pound.  I instructed him to take Lasix tonight and also tomorrow.  He is no longer on scheduled Lasix in fact he is only on Lasix 40 mg on a PRN basis.  Surprisingly, this has been controlling his volume status just fine despite previous use of 60mg  daily of lasix.  As far as repeat cardioversion, today's EKG does indicate he is in 2-1 atrial flutter, heart rate is barely controlled.  He wished to recover from the hip fracture first before cardioversion, given lack of symptom, I think this is reasonable.  He has appointment with Dr. Stanford [name] in 1 month, he can discuss with Dr. Stanford [name] regarding cardioversion at that time. Since he has not been able to maintain sinus rhythm, I will hold off on repeat Echo to check his mitral regurgitation.   Past Medical History:  Diagnosis Date  . Asthma    with worsening with beta blockade  . Atrial fibrillation ([place])   . CAD (coronary artery disease)   . CVA (cerebral infarction) 2012   tia, mild no residual defecits  . GI bleed 8 yrs ago   due to doll fully vessel which was clipped  . Glaucoma    excellent cataracts  . History of kidney stones   . Ischemic cardiomyopathy   . Moderate to severe mitral regurgitation 06/16/2017  . Nephrolithiasis   . Post-infarction apical thrombus ([place])   . Prostate cancer ([place]) 2012  . Radiation proctitis Feb 2015   treated with APC  . Tubular adenoma 04/2013   Dr. Hilarie [name]    Past Surgical History:  Procedure Laterality Date  . cardiac stents  2003    . CARDIOVERSION N/A 06/15/2017   Procedure: CARDIOVERSION;  Surgeon: Donald [name], Donald Cheng, MD;  Location: [facility];  Service: Cardiovascular;  Laterality: N/A;  . CARDIOVERSION N/A 07/15/2017   Procedure: CARDIOVERSION;  Surgeon: Jerline Pain, MD;  Location: [facility] ENDOSCOPY;  Service: Cardiovascular;  Laterality: N/A;  . CARDIOVERSION N/A 07/20/2017   Procedure: CARDIOVERSION;  Surgeon: Herminio Commons, MD;  Location: [facility] ENDOSCOPY;  Service: Cardiovascular;  Laterality: N/A;  . COLONOSCOPY WITH PROPOFOL N/A 05/05/2013   Procedure: COLONOSCOPY WITH PROPOFOL;  Surgeon: Jerene Bears, MD;  Location: [facility] ENDOSCOPY;  Service: Gastroenterology;  Laterality: N/A;  . CORONARY ARTERY BYPASS GRAFT  1998   LIMA to the LAD and saphenous vein graft tothe diagonal  . EXTRACORPOREAL SHOCK WAVE LITHOTRIPSY Right 04/29/2017   Procedure: RIGHT EXTRACORPOREAL SHOCK WAVE LITHOTRIPSY (ESWL);  Surgeon: Alexis Frock,  MD;  Location: [facility] ORS;  Service: Urology;  Laterality: Right;  . history of radiation treatment  2012   x 40 treatments done  . KNEE ARTHROSCOPY  2016 or 2017   Dr Gladstone Lighter ;   . PARTIAL KNEE ARTHROPLASTY Right 01/13/2017   Procedure: UNICOMPARTMENTAL RIGHT KNEE;  Surgeon: Gaynelle Arabian, MD;  Location: [facility] ORS;  Service: Orthopedics;  Laterality: Right;  with block    Current Medications: Outpatient Medications Prior to Visit  Medication Sig Dispense Refill  . acetaminophen (TYLENOL) 325 MG tablet Take 2 tablets (650 mg total) by mouth every 6 (six) hours as needed for mild pain (or Fever >/= 101).    Marland Kitchen amiodarone (PACERONE) 200 MG tablet Take 1 tablet (200 mg total) by mouth daily.    Marland Kitchen apixaban (ELIQUIS) 5 MG TABS tablet Take 1 tablet (5 mg total) by mouth 2 (two) times daily. 60 tablet   . Cholecalciferol (VITAMIN D) 2000 UNITS tablet Take 2,000 Units by mouth daily.    . Fluticasone Furoate-Vilanterol (BREO ELLIPTA) 100-25 MCG/INH AEPB Inhale 1 puff into the lungs daily as needed (shortness of  breath).     . furosemide (LASIX) 40 MG tablet Take 1 tablet (40 mg total) by mouth daily as needed for fluid or edema. 60 tablet 5  . hydrocortisone cream 1 % Apply 1 application topically 3 (three) times daily as needed for itching (skin irritation). 30 g 0  . latanoprost (XALATAN) 0.005 % ophthalmic solution Place 1 drop into the left eye at bedtime.     Marland Kitchen loratadine (CLARITIN) 10 MG tablet Take 10 mg by mouth daily.    . Multiple Vitamin (MULTIVITAMIN WITH MINERALS) TABS tablet Take 1 tablet by mouth daily.    Marland Kitchen oxyCODONE (OXY IR/ROXICODONE) 5 MG immediate release tablet Take 1-2 tablets (5-10 mg total) by mouth every 4 (four) hours as needed for moderate pain. 6 tablet 0  . ranolazine (RANEXA) 500 MG 12 hr tablet Take 1 tablet (500 mg total) by mouth 2 (two) times daily. 60 tablet 5  . senna (SENOKOT) 8.6 MG TABS tablet Take 1 tablet by mouth at bedtime.    . Tamsulosin HCl (FLOMAX) 0.4 MG CAPS Take 0.4 mg by mouth every evening.      No facility-administered medications prior to visit.      Allergies:   Keflex [cephalexin] and Morphine   Social History   Socioeconomic History  . Marital status: Widowed    Spouse name: Not on file  . Number of children: 3  . Years of education: Not on file  . Highest education level: Not on file  Occupational History  . Occupation: Retired Conservation officer, [employer]  Social Needs  . Financial resource strain: Not on file  . Food insecurity:    Worry: Not on file    Inability: Not on file  . Transportation needs:    Medical: Not on file    Non-medical: Not on file  Tobacco Use  . Smoking status: Never Smoker  . Smokeless tobacco: Never Used  Substance and Sexual Activity  . Alcohol use: Yes    Alcohol/week: 0.0 oz    Comment: occasional beer or wine  . Drug use: No  . Sexual activity: Not Currently    Birth control/protection: Abstinence  Lifestyle  . Physical activity:    Days per week: Not on file    Minutes per session: Not on file  .  Stress: Not on file  Relationships  . Social connections:  Talks on phone: Not on file    Gets together: Not on file    Attends religious service: Not on file    Active member of club or organization: Not on file    Attends meetings of clubs or organizations: Not on file    Relationship status: Not on file  Other Topics Concern  . Not on file  Social History Narrative  . Not on file     Family History:  The patient's family history includes Heart attack in his father; Heart disease in his mother; Stomach cancer in his mother.   ROS:   Please see the history of present illness.    ROS All other systems reviewed and are negative.   PHYSICAL EXAM:   VS:  BP 110/78 (BP Location: Left Arm)   Pulse 94   Ht 5\' 10"  (1.778 m)   Wt 174 lb 12.8 oz (79.3 kg)   BMI 25.08 kg/m    GEN: Well nourished, well developed, in no acute distress  HEENT: normal  Neck: no JVD, carotid bruits, or masses Cardiac: regular; no murmurs, rubs, or gallops,no edema  Respiratory:  clear to auscultation bilaterally, normal work of breathing GI: soft, nontender, nondistended, + BS MS: no deformity or atrophy  Skin: warm and dry, no rash Neuro:  Alert and Oriented x 3, Strength and sensation are intact Psych: euthymic mood, full affect  Wt Readings from Last 3 Encounters:  09/01/17 174 lb 12.8 oz (79.3 kg)  08/09/17 175 lb 14.8 oz (79.8 kg)  07/28/17 181 lb 7 oz (82.3 kg)      Studies/Labs Reviewed:   EKG:  EKG is ordered today.  The ekg ordered today demonstrates 2-1 atrial flutter, right bundle branch block  Recent Labs: 06/10/2017: TSH 9.474 07/20/2017: B Natriuretic Peptide 980.9 07/21/2017: Magnesium 2.1 07/26/2017: ALT 27 08/08/2017: BUN 16; Creatinine, Ser 0.99; Potassium 4.2; Sodium 137 08/09/2017: Hemoglobin 12.2; Platelets 326   Lipid Panel    Component Value Date/Time   CHOL 60 07/15/2017 2329   TRIG 51 07/15/2017 2329   TRIG 44 08/13/2008   HDL 19 (L) 07/15/2017 2329   CHOLHDL  3.2 07/15/2017 2329   VLDL 10 07/15/2017 2329   LDLCALC 31 07/15/2017 2329    Additional studies/ records that were reviewed today include:   Echo 06/13/2017 LV EF: 40% -   45% Study Conclusions  - Left ventricle: The cavity size was normal. Wall thickness was   increased in a pattern of mild LVH. Systolic function was mildly   to moderately reduced. The estimated ejection fraction was in the   range of 40% to 45%. Akinesis of the apical myocardium. - Ventricular septum: The contour showed systolic flattening. These   changes are consistent with RV volume and pressure overload. - Aortic valve: Trileaflet; normal thickness, mildly calcified   leaflets. Transvalvular velocity was increased. There was mild   stenosis. Valve area (VTI): 1.28 cm^2. Valve area (Vmax): 1.35   cm^2. Valve area (Vmean): 1.21 cm^2. - Aortic root: The aortic root was mildly dilated. - Mitral valve: Mildly calcified annulus. There was moderate to   severe regurgitation. - Left atrium: The atrium was severely dilated. - Right ventricle: The cavity size was dilated. Wall thickness was   normal. - Right atrium: The atrium was severely dilated. - Tricuspid valve: There was moderate regurgitation. - Pulmonary arteries: Systolic pressure could not be accurately   estimated. Suspect it is higher because of other characteristics.   PA peak pressure: 33  mm Hg (S).    ASSESSMENT:    1. Persistent atrial fibrillation ([place])   2. Coronary artery disease involving coronary bypass graft of native heart without angina pectoris   3. H/O: CVA (cerebrovascular accident)   4. Chronic systolic heart failure ([place])   5. Non-rheumatic mitral regurgitation   6. Subdural hematoma ([facility])   7. Closed nondisplaced fracture of right acetabulum, unspecified portion of acetabulum, sequela      PLAN:  In order of problems listed above:  1. Persistent atrial fibrillation/atrial flutter: Currently in 2-1 atrial flutter, on  Eliquis.  Heart rate is barely controlled.  Patient has already underwent 3 cardioversions in the past few months.  May consider another cardioversion now he is on Ranexa and amiodarone.  However, he is currently recovering from right hip fracture, he would like to consider to cardioversion once he finished physical therapy and can walk around.  I think this is reasonable as he lack any cardiac awareness of atrial fibrillation.  2. CAD s/p CABG: Prior history of mural thrombus.  Denies any chest pain or shortness of breath.  Not on aspirin given the need for Eliquis.  3. Chronic systolic heart failure: His baseline dry weight is 170 pounds, today his weight is 173 pounds.  He is monitoring his weight on a daily basis.  I instructed him to take as needed dose of Lasix today and tomorrow.  Although he used to be on 60 mg daily of Lasix, during the recent hospitalization, he was discharged on 40 mg as needed dose of Lasix instead.  Surprisingly this has been controlling his weight quite well.  4. Moderate to severe MR: Seen on previous echocardiogram in March 2019, original plan is to repeat echocardiogram once patient is able to maintain sinus rhythm.  5. History of CVA: No significant neurological deficit  6. Right acetabular fracture: Recovering well, still cannot bear weight but he will soon start on physical therapy  7. Subdural hematoma: Result of syncope and falling on his forehead.  Since then, he has been restarted on Eliquis without any issue.    Medication Adjustments/Labs and Tests Ordered: Current medicines are reviewed at length with the patient today.  Concerns regarding medicines are outlined above.  Medication changes, Labs and Tests ordered today are listed in the Patient Instructions below. Patient Instructions  Medication Instructions:  INCREASE Lasix to 40mg  take TODAY AND TOMORROW ONLY THEN AS NEEDED  GOAL IS DRY WEIGHT OF  170  Labwork: None  Testing/Procedures: None  Follow-Up: Keep follow up appointment with Dr Donald [name] as scheduled.  Any Other Special Instructions Will Be Listed Below (If Applicable).  If you need a refill on your cardiac medications before your next appointment, please call your pharmacy.     Hilbert Corrigan, [place]  09/03/2017 7:55 AM    [address], [place], [place]  [address] Phone: [phone]; Fax: [phone]

## 2017-09-03 ENCOUNTER — Encounter: Payer: Self-pay | Admitting: Physician Assistant

## 2017-09-03 DIAGNOSIS — S32414A Nondisplaced fracture of anterior wall of right acetabulum, initial encounter for closed fracture: Secondary | ICD-10-CM | POA: Diagnosis not present

## 2017-09-03 DIAGNOSIS — S32424A Nondisplaced fracture of posterior wall of right acetabulum, initial encounter for closed fracture: Secondary | ICD-10-CM | POA: Diagnosis not present

## 2017-09-08 ENCOUNTER — Other Ambulatory Visit: Payer: Self-pay | Admitting: Licensed Clinical Social Worker

## 2017-09-08 DIAGNOSIS — S51011D Laceration without foreign body of right elbow, subsequent encounter: Secondary | ICD-10-CM | POA: Diagnosis not present

## 2017-09-08 DIAGNOSIS — R296 Repeated falls: Secondary | ICD-10-CM | POA: Diagnosis not present

## 2017-09-08 DIAGNOSIS — M25551 Pain in right hip: Secondary | ICD-10-CM | POA: Diagnosis not present

## 2017-09-08 NOTE — Patient Outreach (Signed)
[facility]) Care Management  09/08/2017  Donald [name] [birth year] [id number]  [facility] CSW completed outreach call to patient to follow up on expected SNF discharge date of 09/07/17. Patient answered and provided HIPPA verifications. He reports that he had a fall yesterday and therefore will not be ready to return home yet. [facility] CSW provided emotional support over phone call and patient was receptive. [facility] CSW will complete PAC Consult this week.  [name], BSW, MSW, [email or web address] Phone: [phone] Fax: [phone]

## 2017-09-09 ENCOUNTER — Other Ambulatory Visit: Payer: Self-pay | Admitting: Licensed Clinical Social Worker

## 2017-09-09 DIAGNOSIS — S7001XA Contusion of right hip, initial encounter: Secondary | ICD-10-CM | POA: Diagnosis not present

## 2017-09-09 NOTE — Patient Outreach (Signed)
[facility]) Care Management  09/09/2017  Donald [name] Aug 03, 1926 [id number]  [place] arrived at [facility] SN[facility] to complete [facility] Consult. Patient was in his room when [facility] CSW arrived. Patient reports that he had a recent fall this week which has led to a pushed back discharge date. Patient reports he had fall while transferring into a car to go to a hair cut appointment. Patient reports that the fall could have been worse but that he is very sore on his entire right body side. Patient reports that the [facility] admissions director Claiborne Billings informed him that he is still in his Medicare day coverage. Patient reports a new expected SNF discharge date of 09/20/17. Patient repots that his daughter will stay with him for a few weeks once that discharge happens. [facility] CSW will continue to follow patient while he is at [facility].  [name], BSW, MSW, [email or web address] Phone: [phone] Fax: [phone]

## 2017-09-10 ENCOUNTER — Ambulatory Visit: Payer: Medicare Other | Admitting: Vascular Surgery

## 2017-09-10 ENCOUNTER — Encounter (HOSPITAL_COMMUNITY): Payer: Medicare Other

## 2017-09-13 DIAGNOSIS — H409 Unspecified glaucoma: Secondary | ICD-10-CM | POA: Diagnosis not present

## 2017-09-13 DIAGNOSIS — W19XXXA Unspecified fall, initial encounter: Secondary | ICD-10-CM | POA: Diagnosis not present

## 2017-09-13 DIAGNOSIS — M6281 Muscle weakness (generalized): Secondary | ICD-10-CM | POA: Diagnosis not present

## 2017-09-13 DIAGNOSIS — S32411B Displaced fracture of anterior wall of right acetabulum, initial encounter for open fracture: Secondary | ICD-10-CM | POA: Diagnosis not present

## 2017-09-13 DIAGNOSIS — I5022 Chronic systolic (congestive) heart failure: Secondary | ICD-10-CM | POA: Diagnosis not present

## 2017-09-13 DIAGNOSIS — M79673 Pain in unspecified foot: Secondary | ICD-10-CM | POA: Diagnosis not present

## 2017-09-13 DIAGNOSIS — I7092 Chronic total occlusion of artery of the extremities: Secondary | ICD-10-CM | POA: Diagnosis not present

## 2017-09-13 DIAGNOSIS — J449 Chronic obstructive pulmonary disease, unspecified: Secondary | ICD-10-CM | POA: Diagnosis not present

## 2017-09-13 DIAGNOSIS — S065X0A Traumatic subdural hemorrhage without loss of consciousness, initial encounter: Secondary | ICD-10-CM | POA: Diagnosis not present

## 2017-09-13 DIAGNOSIS — C679 Malignant neoplasm of bladder, unspecified: Secondary | ICD-10-CM | POA: Diagnosis not present

## 2017-09-13 DIAGNOSIS — I480 Paroxysmal atrial fibrillation: Secondary | ICD-10-CM | POA: Diagnosis not present

## 2017-09-13 DIAGNOSIS — I2510 Atherosclerotic heart disease of native coronary artery without angina pectoris: Secondary | ICD-10-CM | POA: Diagnosis not present

## 2017-09-14 ENCOUNTER — Other Ambulatory Visit: Payer: Self-pay | Admitting: Licensed Clinical Social Worker

## 2017-09-14 NOTE — Patient Outreach (Signed)
Donald [name]) Care Management  09/14/2017  Donald [name] Jan 18, 1927 [id number]  [place] arrived at [facility] to complete [facility] Consult. [facility] CSW went to patient's room and successfully completed joint visit with [place] Datero. Patient's therapist came in the room and notified patient that his PT will start back on 09/16/17. Patient has only been receiving OT since his recent fall. Patient shares that he is still sore from the fall but that tylenol seems to be helping him with the soreness. Patient shares that he should be discharging from SNF in a few weeks but that he will pay out of pocket first if he does not feel that he is ready once the time comes. [facility] CSW spoke with PT and was given an estimated discharge date of 09/22/17.  [facility] CSW will follow up within 10 days to gain discharge updates.  Donald [name], BSW, MSW, [email or web address] Phone: [phone] Fax: [phone]

## 2017-09-16 NOTE — Progress Notes (Signed)
HPI: FU CAD, CHF and atrial fibrillation. He is status post coronary bypassing graft in 1990; also with h/o ischemic cardiomyopathy, prior stroke and prior mural apical thrombus on chronic coumadin Rx. Prior abdominal ultrasound in May 2006 showed no aneurysm. Carotid Dopplers in November of 2005 showed 0-39% bilaterally. MRA in October of 2007 showed no obstructive disease. LHC 09/24/11: LM 30, oLAD 100%, dLAD fills via RV branch off RCA, IM diffuse 95% subtotal disease, mCFX 40%, OM1 normal, RCA normal with large RV branch supplying dLAD, PLA possibly occluded and most dRCA appears to fill from LIMA collats; LIMA patent but does not supply dLAD (connects via small vessel to ? DRCA/PLA, S-IM occluded). Circulation felt to be stable. Continue med Rx recommended.  Nuclear study 2018 showed ejection fraction 46%.  There was infarct but no ischemia.  Last echocardiogram March 2019 showed ejection fraction 40 to 45% with akinesis of the apex.  There was mild aortic stenosis, moderate to severe mitral regurgitation, severe biatrial enlargement, mild right ventricular enlargement and moderate tricuspid regurgitation.  The hope was that mitral regurgitation would improve if sinus reestablished.  If not MitraClip could be considered.  Patient with recent complicated course including diagnosis of atrial fibrillation, problems with congestive heart failure.  Patient had a fall in April 2019 and suffered a small subdural hematoma.  He was ultimately also diagnosed with right pelvic fracture treated conservatively. CTA showed occlusion of the right popliteal artery of indeterminate chronicity; followed by vascular surgery.  Patient also with hypotension/orthostasis and cardiac medications have been held.  Since last seen,  patient has limited mobility because of recent hip fracture but this seems to be improving.  He denies dyspnea, chest pain or syncope.  He has mild edema and left ankle.  He is taking Lasix as needed  for increase weight above 170 pounds and this appears to be controlling his overall fluid status.  Current Outpatient Medications  Medication Sig Dispense Refill  . acetaminophen (TYLENOL) 325 MG tablet Take 2 tablets (650 mg total) by mouth every 6 (six) hours as needed for mild pain (or Fever >/= 101).    Marland Kitchen amiodarone (PACERONE) 200 MG tablet Take 1 tablet (200 mg total) by mouth daily.    Marland Kitchen apixaban (ELIQUIS) 5 MG TABS tablet Take 1 tablet (5 mg total) by mouth 2 (two) times daily. 60 tablet   . Cholecalciferol (VITAMIN D) 2000 UNITS tablet Take 2,000 Units by mouth daily.    . Fluticasone Furoate-Vilanterol (BREO ELLIPTA) 100-25 MCG/INH AEPB Inhale 1 puff into the lungs daily as needed (shortness of breath).     . furosemide (LASIX) 40 MG tablet Take 1 tablet (40 mg total) by mouth daily as needed for fluid or edema. 60 tablet 5  . hydrocortisone cream 1 % Apply 1 application topically 3 (three) times daily as needed for itching (skin irritation). 30 g 0  . latanoprost (XALATAN) 0.005 % ophthalmic solution Place 1 drop into the left eye at bedtime.     Marland Kitchen loratadine (CLARITIN) 10 MG tablet Take 10 mg by mouth daily.    . Multiple Vitamin (MULTIVITAMIN WITH MINERALS) TABS tablet Take 1 tablet by mouth daily.    Marland Kitchen oxyCODONE (OXY IR/ROXICODONE) 5 MG immediate release tablet Take 1-2 tablets (5-10 mg total) by mouth every 4 (four) hours as needed for moderate pain. 6 tablet 0  . ranolazine (RANEXA) 500 MG 12 hr tablet Take 1 tablet (500 mg total) by mouth 2 (two) times  daily. 60 tablet 5  . senna (SENOKOT) 8.6 MG TABS tablet Take 1 tablet by mouth at bedtime.    . Tamsulosin HCl (FLOMAX) 0.4 MG CAPS Take 0.4 mg by mouth every evening.      No current facility-administered medications for this visit.      Past Medical History:  Diagnosis Date  . Asthma    with worsening with beta blockade  . Atrial fibrillation ([place])   . CAD (coronary artery disease)   . CVA (cerebral infarction) 2012    tia, mild no residual defecits  . GI bleed 8 yrs ago   due to doll fully vessel which was clipped  . Glaucoma    excellent cataracts  . History of kidney stones   . Ischemic cardiomyopathy   . Moderate to severe mitral regurgitation 06/16/2017  . Nephrolithiasis   . Post-infarction apical thrombus ([place])   . Prostate cancer ([place]) 2012  . Radiation proctitis Feb 2015   treated with APC  . Tubular adenoma 04/2013   Dr. Hilarie Fredrickson    Past Surgical History:  Procedure Laterality Date  . cardiac stents  2003  . CARDIOVERSION N/A 06/15/2017   Procedure: CARDIOVERSION;  Surgeon: Acie Fredrickson, Wonda Cheng, MD;  Location: [facility];  Service: Cardiovascular;  Laterality: N/A;  . CARDIOVERSION N/A 07/15/2017   Procedure: CARDIOVERSION;  Surgeon: Jerline Pain, MD;  Location: [facility] ENDOSCOPY;  Service: Cardiovascular;  Laterality: N/A;  . CARDIOVERSION N/A 07/20/2017   Procedure: CARDIOVERSION;  Surgeon: Herminio Commons, MD;  Location: [facility] ENDOSCOPY;  Service: Cardiovascular;  Laterality: N/A;  . COLONOSCOPY WITH PROPOFOL N/A 05/05/2013   Procedure: COLONOSCOPY WITH PROPOFOL;  Surgeon: Jerene Bears, MD;  Location: [facility] ENDOSCOPY;  Service: Gastroenterology;  Laterality: N/A;  . CORONARY ARTERY BYPASS GRAFT  1998   LIMA to the LAD and saphenous vein graft tothe diagonal  . EXTRACORPOREAL SHOCK WAVE LITHOTRIPSY Right 04/29/2017   Procedure: RIGHT EXTRACORPOREAL SHOCK WAVE LITHOTRIPSY (ESWL);  Surgeon: Alexis Frock, MD;  Location: [facility] ORS;  Service: Urology;  Laterality: Right;  . history of radiation treatment  2012   x 40 treatments done  . KNEE ARTHROSCOPY  2016 or 2017   Dr Gladstone Lighter ;   . PARTIAL KNEE ARTHROPLASTY Right 01/13/2017   Procedure: UNICOMPARTMENTAL RIGHT KNEE;  Surgeon: Gaynelle Arabian, MD;  Location: [facility] ORS;  Service: Orthopedics;  Laterality: Right;  with block    Social History   Socioeconomic History  . Marital status: Widowed    Spouse name: Not on file  . Number of children: 3  .  Years of education: Not on file  . Highest education level: Not on file  Occupational History  . Occupation: Retired Conservation officer, [employer]  Social Needs  . Financial resource strain: Not on file  . Food insecurity:    Worry: Not on file    Inability: Not on file  . Transportation needs:    Medical: Not on file    Non-medical: Not on file  Tobacco Use  . Smoking status: Never Smoker  . Smokeless tobacco: Never Used  Substance and Sexual Activity  . Alcohol use: Yes    Alcohol/week: 0.0 oz    Comment: occasional beer or wine  . Drug use: No  . Sexual activity: Not Currently    Birth control/protection: Abstinence  Lifestyle  . Physical activity:    Days per week: Not on file    Minutes per session: Not on file  . Stress: Not on file  Relationships  .  Social connections:    Talks on phone: Not on file    Gets together: Not on file    Attends religious service: Not on file    Active member of club or organization: Not on file    Attends meetings of clubs or organizations: Not on file    Relationship status: Not on file  . Intimate partner violence:    Fear of current or ex partner: Not on file    Emotionally abused: Not on file    Physically abused: Not on file    Forced sexual activity: Not on file  Other Topics Concern  . Not on file  Social History Narrative  . Not on file    Family History  Problem Relation Age of Onset  . Heart attack Father   . Heart disease Mother   . Stomach cancer Mother     ROS: Hip pain but no fevers or chills, productive cough, hemoptysis, dysphasia, odynophagia, melena, hematochezia, dysuria, hematuria, rash, seizure activity, orthopnea, PND, claudication. Remaining systems are negative.  Physical Exam: Well-developed well-nourished in no acute distress.  Skin is warm and dry.  HEENT is normal.  Neck is supple.  Chest is clear to auscultation with normal expansion.  Cardiovascular exam is irregular Abdominal exam nontender or  distended. No masses palpated. Extremities show 1+ edema left ankle. neuro grossly intact  ECG-atrial flutter, right bundle branch block.  Personally reviewed  A/P  1 coronary artery disease-patient is not on aspirin given need for anticoagulation.  Continue statin.  He has not had chest pain.  2 persistent atrial fibrillation/flutter-patient remains in atrial flutter today although tolerating well.  I would like to reestablish sinus rhythm to see if this would help his congestive heart failure symptoms.  He is on amiodarone and ranolazine.  Continue apixaban 5 mg twice daily.  He is compliant and has not missed doses.  We will arrange cardioversion in late August.  Hopefully he will maintain sinus rhythm.  3 chronic combined systolic/diastolic congestive heart failure-patient appears to be doing well from a volume standpoint with only mild edema in left ankle.  We discussed fluid restriction and low-sodium diet.  He will continue Lasix as needed.  He takes this for weight gain of 3 pounds above 170.  Check potassium and renal function.  4 ischemic cardiomyopathy-cardiac medications previously held because of hypotension.  We will reassess to see if he will tolerate after we reestablish sinus rhythm.  5 history of mural thrombus at the apex of left ventricle-continue apixaban.  6 moderate to severe mitral regurgitation-we will plan to reassess once we reestablish sinus rhythm.  Hopefully this will improve.  7 status post hip fracture-he will follow-up with orthopedics.  8 occluded right popliteal artery-he is not having pain in his right lower extremity.  I will arrange follow-up with vascular surgery which was scheduled at discharge.  Continue apixaban.  Kirk Ruths, MD

## 2017-09-20 DIAGNOSIS — C679 Malignant neoplasm of bladder, unspecified: Secondary | ICD-10-CM | POA: Diagnosis not present

## 2017-09-20 DIAGNOSIS — J449 Chronic obstructive pulmonary disease, unspecified: Secondary | ICD-10-CM | POA: Diagnosis not present

## 2017-09-20 DIAGNOSIS — I7092 Chronic total occlusion of artery of the extremities: Secondary | ICD-10-CM | POA: Diagnosis not present

## 2017-09-20 DIAGNOSIS — M79673 Pain in unspecified foot: Secondary | ICD-10-CM | POA: Diagnosis not present

## 2017-09-20 DIAGNOSIS — I2510 Atherosclerotic heart disease of native coronary artery without angina pectoris: Secondary | ICD-10-CM | POA: Diagnosis not present

## 2017-09-20 DIAGNOSIS — J3089 Other allergic rhinitis: Secondary | ICD-10-CM | POA: Diagnosis not present

## 2017-09-20 DIAGNOSIS — K5900 Constipation, unspecified: Secondary | ICD-10-CM | POA: Diagnosis not present

## 2017-09-20 DIAGNOSIS — I5022 Chronic systolic (congestive) heart failure: Secondary | ICD-10-CM | POA: Diagnosis not present

## 2017-09-20 DIAGNOSIS — H409 Unspecified glaucoma: Secondary | ICD-10-CM | POA: Diagnosis not present

## 2017-09-20 DIAGNOSIS — S065X0A Traumatic subdural hemorrhage without loss of consciousness, initial encounter: Secondary | ICD-10-CM | POA: Diagnosis not present

## 2017-09-20 DIAGNOSIS — R296 Repeated falls: Secondary | ICD-10-CM | POA: Diagnosis not present

## 2017-09-20 DIAGNOSIS — I480 Paroxysmal atrial fibrillation: Secondary | ICD-10-CM | POA: Diagnosis not present

## 2017-09-22 ENCOUNTER — Other Ambulatory Visit: Payer: Self-pay | Admitting: Licensed Clinical Social Worker

## 2017-09-22 NOTE — Patient Outreach (Signed)
[place] [place] [facility]) Care Management  09/22/2017  Donald [name] [birth year] [id number]  [place] arrived at [facility] SNF to complete final PAC Consult. Patient was outside of SNF enjoying the weather when [facility] CSW arrived. Patient reports that he will be discharging back home on 09/27/17. Patient confirms that he has updated his daughter already. Patient agreeable to be followed by [facility] RNCM post discharge. [facility] CSW unable to meet with SNF social worker as she was not in her office when [facility] CSW was there. [facility] CSW will send email to SNF social worker at this time requesting final discharge updates on patient.  [name], BSW, MSW, [email or web address] Phone: [phone] Fax: [phone]

## 2017-09-24 DIAGNOSIS — I2510 Atherosclerotic heart disease of native coronary artery without angina pectoris: Secondary | ICD-10-CM | POA: Diagnosis not present

## 2017-09-24 DIAGNOSIS — K5900 Constipation, unspecified: Secondary | ICD-10-CM | POA: Diagnosis not present

## 2017-09-24 DIAGNOSIS — M79673 Pain in unspecified foot: Secondary | ICD-10-CM | POA: Diagnosis not present

## 2017-09-24 DIAGNOSIS — M6281 Muscle weakness (generalized): Secondary | ICD-10-CM | POA: Diagnosis not present

## 2017-09-24 DIAGNOSIS — C679 Malignant neoplasm of bladder, unspecified: Secondary | ICD-10-CM | POA: Diagnosis not present

## 2017-09-24 DIAGNOSIS — H409 Unspecified glaucoma: Secondary | ICD-10-CM | POA: Diagnosis not present

## 2017-09-24 DIAGNOSIS — I5022 Chronic systolic (congestive) heart failure: Secondary | ICD-10-CM | POA: Diagnosis not present

## 2017-09-24 DIAGNOSIS — J449 Chronic obstructive pulmonary disease, unspecified: Secondary | ICD-10-CM | POA: Diagnosis not present

## 2017-09-24 DIAGNOSIS — I480 Paroxysmal atrial fibrillation: Secondary | ICD-10-CM | POA: Diagnosis not present

## 2017-09-24 DIAGNOSIS — R296 Repeated falls: Secondary | ICD-10-CM | POA: Diagnosis not present

## 2017-09-24 DIAGNOSIS — I7092 Chronic total occlusion of artery of the extremities: Secondary | ICD-10-CM | POA: Diagnosis not present

## 2017-09-24 DIAGNOSIS — S065X0A Traumatic subdural hemorrhage without loss of consciousness, initial encounter: Secondary | ICD-10-CM | POA: Diagnosis not present

## 2017-09-27 ENCOUNTER — Ambulatory Visit (INDEPENDENT_AMBULATORY_CARE_PROVIDER_SITE_OTHER): Payer: Medicare Other | Admitting: Cardiology

## 2017-09-27 ENCOUNTER — Other Ambulatory Visit: Payer: Self-pay | Admitting: Licensed Clinical Social Worker

## 2017-09-27 ENCOUNTER — Encounter: Payer: Self-pay | Admitting: Cardiology

## 2017-09-27 VITALS — BP 120/64 | HR 81 | Ht 69.0 in | Wt 170.8 lb

## 2017-09-27 DIAGNOSIS — I481 Persistent atrial fibrillation: Secondary | ICD-10-CM | POA: Diagnosis not present

## 2017-09-27 DIAGNOSIS — I483 Typical atrial flutter: Secondary | ICD-10-CM | POA: Diagnosis not present

## 2017-09-27 DIAGNOSIS — I4819 Other persistent atrial fibrillation: Secondary | ICD-10-CM

## 2017-09-27 DIAGNOSIS — I2510 Atherosclerotic heart disease of native coronary artery without angina pectoris: Secondary | ICD-10-CM | POA: Diagnosis not present

## 2017-09-27 DIAGNOSIS — I70201 Unspecified atherosclerosis of native arteries of extremities, right leg: Secondary | ICD-10-CM | POA: Diagnosis not present

## 2017-09-27 DIAGNOSIS — I5022 Chronic systolic (congestive) heart failure: Secondary | ICD-10-CM

## 2017-09-27 LAB — BASIC METABOLIC PANEL
BUN/Creatinine Ratio: 18 (ref 10–24)
BUN: 17 mg/dL (ref 8–27)
CALCIUM: 9.3 mg/dL (ref 8.6–10.2)
CO2: 29 mmol/L (ref 20–29)
CREATININE: 0.93 mg/dL (ref 0.76–1.27)
Chloride: 100 mmol/L (ref 96–106)
GFR calc Af Amer: 84 mL/min/{1.73_m2} (ref >59)
GFR, EST NON AFRICAN AMERICAN: 73 mL/min/{1.73_m2} (ref >59)
Glucose: 88 mg/dL (ref 65–99)
Potassium: 4.4 mmol/L (ref 3.5–5.2)
Sodium: 141 mmol/L (ref 134–144)

## 2017-09-27 NOTE — Patient Instructions (Signed)
Medication Instructions:   NO CHANGE  Labwork:  Your physician recommends that you HAVE LAB WORK TODAY  Testing/Procedures: You are scheduled for a Cardioversion on Tuesday 11/23/17 with Dr. [name].  Please arrive at the [facility] (Main Entrance A) at [facility]: [address] [place], [place] [address] at 8:30 AM TO 9 AM (1 hour prior to procedure unless lab work is needed; if lab work is needed arrive 1.5 hours ahead)  DIET: Nothing to eat or drink after midnight except a sip of water with medications (see medication instructions below)  Medication Instructions: Continue your anticoagulant: ELIQUIS You will need to continue your anticoagulant after your procedure until you  are told by your  Provider that it is safe to stop  You must have a responsible person to drive you home and stay in the waiting area during your procedure. Failure to do so could result in cancellation.  Bring your insurance cards.  *Special Note: Every effort is made to have your procedure done on time. Occasionally there are emergencies that occur at the hospital that may cause delays. Please be patient if a delay does occur.     Follow-Up:  Your physician recommends that you schedule a follow-up appointment in: [place]   If you need a refill on your cardiac medications before your next appointment, please call your pharmacy.

## 2017-09-27 NOTE — Patient Outreach (Addendum)
[place] [facility]) Care Management  09/27/2017  Isom Kochan Thaw September 12, 1926 [id number]  [facility] CSW completed call to patient's granddaughter and was able to successfully reach her. HIPPA compliant verifications successfully provided. Granddaughter reports that patient had a medical appointment today and then he is scheduled to discharge back home from [facility] SNF this afternoon with [facility] services. [facility] CSW informed family that she will sign off case at this time and refer patient to [place] to follow up with his transition back home from SNF. [facility] CSW completed referral for [facility] RNCM, updated PCP and signed off.  [name], BSW, MSW, [email or web address] Phone: [phone] Fax: [phone]

## 2017-09-28 ENCOUNTER — Other Ambulatory Visit: Payer: Self-pay | Admitting: *Deleted

## 2017-09-28 ENCOUNTER — Encounter: Payer: Self-pay | Admitting: *Deleted

## 2017-09-28 DIAGNOSIS — I482 Chronic atrial fibrillation: Secondary | ICD-10-CM | POA: Diagnosis not present

## 2017-09-28 DIAGNOSIS — I051 Rheumatic mitral insufficiency: Secondary | ICD-10-CM | POA: Diagnosis not present

## 2017-09-28 DIAGNOSIS — I4819 Other persistent atrial fibrillation: Secondary | ICD-10-CM

## 2017-09-28 DIAGNOSIS — S32401D Unspecified fracture of right acetabulum, subsequent encounter for fracture with routine healing: Secondary | ICD-10-CM | POA: Diagnosis not present

## 2017-09-28 DIAGNOSIS — J449 Chronic obstructive pulmonary disease, unspecified: Secondary | ICD-10-CM | POA: Diagnosis not present

## 2017-09-28 DIAGNOSIS — Z8551 Personal history of malignant neoplasm of bladder: Secondary | ICD-10-CM | POA: Diagnosis not present

## 2017-09-28 DIAGNOSIS — N183 Chronic kidney disease, stage 3 (moderate): Secondary | ICD-10-CM | POA: Diagnosis not present

## 2017-09-28 DIAGNOSIS — Z7901 Long term (current) use of anticoagulants: Secondary | ICD-10-CM | POA: Diagnosis not present

## 2017-09-28 DIAGNOSIS — I130 Hypertensive heart and chronic kidney disease with heart failure and stage 1 through stage 4 chronic kidney disease, or unspecified chronic kidney disease: Secondary | ICD-10-CM | POA: Diagnosis not present

## 2017-09-28 DIAGNOSIS — I5020 Unspecified systolic (congestive) heart failure: Secondary | ICD-10-CM | POA: Diagnosis not present

## 2017-09-28 DIAGNOSIS — N400 Enlarged prostate without lower urinary tract symptoms: Secondary | ICD-10-CM | POA: Diagnosis not present

## 2017-09-28 DIAGNOSIS — I2510 Atherosclerotic heart disease of native coronary artery without angina pectoris: Secondary | ICD-10-CM | POA: Diagnosis not present

## 2017-09-28 DIAGNOSIS — I255 Ischemic cardiomyopathy: Secondary | ICD-10-CM | POA: Diagnosis not present

## 2017-09-28 NOTE — Patient Outreach (Addendum)
[name]) Care Management  09/28/2017  Donald [name] Dimitri 1927-03-08 [id number]   Transition of care call  Referral received : 09/27/17 Referral source: [facility] LCSW Referral reason : Discharge from [facility] SNF on 09/27/17  PMHx includes : Recent admission to [facility] 4/27-5/1, Small subdural hematoma secondary to fall,  NL:[id number] , right hip pain, Atrial fib CHF, COPD    Readmission from SNF  5/12- 13 RLE pain, Right acetabular fracture, Occlusion right popliteal artery   PMHX: includes but not limited to CVA, ischemic cardiomyopathy,  CABG..   Successful outreach call to patient ,HIPAA verified.  Explained reason for the call and reintroduced [facility] care management services , Community care coordinator follow up call for  after discharge and explained transition of care follow up.   Patient reports that he is doing fairly well after discharge, just tired.  He discussed his daughter plans to stay with him initially after discharge.  Patient reports he is taking it a little slow now, has a walker for use at home, [facility] has already visited with him at home this morning and plan return visit in am.   He discussed support  that he has his grand daughter that is a Tourist information centre manager , and his daughter lives close by, patient reports that he has a pill organizer and they help with filling it.  Patient was recently discharged from hospital and all medications have been reviewed. Patient discussed weighing daily and keeping a spreadsheet to log weights, he reports taking lasix as needed for weight gain of 2 pounds in a day. He reports today's weight as 170.8 , denies increase in shortness of breath .  Reports that he limits salt in diet and family helping with different recipes.  Patient reports he had visit with cardiologist on yesterday and has an appointment in a couple of weeks with his PCP Dr.[name] he is unable to recall the date/time, reports family  will be able to provide transportation.   Patient agreeable to [facility] care management follow up after discharge home for follow up.    Plan Will update assigned care manager, for continued transition of care follow up  Provide patient  with [facility] telephone contact information .   [facility] CM Care Plan Problem One     Most Recent Value  Care Plan Problem One  Patient with recent discharge from SNF related to fall, atrial fib   Role Documenting the Problem One  Care Management [place] for Problem One  Active  [facility] Long Term Goal   Patient will not experience a hospital readmission in the next 31 days   [facility] Long Term Goal Start Date  09/28/17  Interventions for Problem One Long Term Goal  Discussed taking medications as prescribed, reviewed safety precautions of using walker and adherence to weight bearing status, reviewed notifying MD of increase pain , color change and sensation to right foot.   [facility] CM Short Term Goal #1   Patient will attend all medical appointments in the next 30 days   [facility] CM Short Term Goal #1 Start Date  09/28/17  Interventions for Short Term Goal #1  Discussed   [facility] CM Short Term Goal #2   Patient will reports increase in strenght and balance of the next 30 days   [facility] CM Short Term Goal #2 Start Date  09/28/17  Interventions for Short Term Goal #2  Discussed participation with home health physical therapy exercise and recommendations  to help with increasing strength and preventing falls       Joylene Draft, RN, [place] Management Coordinator  [phone]- Mobile [phone]- [place]

## 2017-09-29 DIAGNOSIS — I130 Hypertensive heart and chronic kidney disease with heart failure and stage 1 through stage 4 chronic kidney disease, or unspecified chronic kidney disease: Secondary | ICD-10-CM | POA: Diagnosis not present

## 2017-09-29 DIAGNOSIS — I5020 Unspecified systolic (congestive) heart failure: Secondary | ICD-10-CM | POA: Diagnosis not present

## 2017-09-29 DIAGNOSIS — I255 Ischemic cardiomyopathy: Secondary | ICD-10-CM | POA: Diagnosis not present

## 2017-09-29 DIAGNOSIS — I2510 Atherosclerotic heart disease of native coronary artery without angina pectoris: Secondary | ICD-10-CM | POA: Diagnosis not present

## 2017-09-29 DIAGNOSIS — S32401D Unspecified fracture of right acetabulum, subsequent encounter for fracture with routine healing: Secondary | ICD-10-CM | POA: Diagnosis not present

## 2017-09-29 DIAGNOSIS — N183 Chronic kidney disease, stage 3 (moderate): Secondary | ICD-10-CM | POA: Diagnosis not present

## 2017-10-01 DIAGNOSIS — I255 Ischemic cardiomyopathy: Secondary | ICD-10-CM | POA: Diagnosis not present

## 2017-10-01 DIAGNOSIS — I5020 Unspecified systolic (congestive) heart failure: Secondary | ICD-10-CM | POA: Diagnosis not present

## 2017-10-01 DIAGNOSIS — S32401D Unspecified fracture of right acetabulum, subsequent encounter for fracture with routine healing: Secondary | ICD-10-CM | POA: Diagnosis not present

## 2017-10-01 DIAGNOSIS — N183 Chronic kidney disease, stage 3 (moderate): Secondary | ICD-10-CM | POA: Diagnosis not present

## 2017-10-01 DIAGNOSIS — I130 Hypertensive heart and chronic kidney disease with heart failure and stage 1 through stage 4 chronic kidney disease, or unspecified chronic kidney disease: Secondary | ICD-10-CM | POA: Diagnosis not present

## 2017-10-01 DIAGNOSIS — I2510 Atherosclerotic heart disease of native coronary artery without angina pectoris: Secondary | ICD-10-CM | POA: Diagnosis not present

## 2017-10-04 ENCOUNTER — Other Ambulatory Visit: Payer: Self-pay | Admitting: Pharmacist Clinician (PhC)/ Clinical Pharmacy Specialist

## 2017-10-04 DIAGNOSIS — I2510 Atherosclerotic heart disease of native coronary artery without angina pectoris: Secondary | ICD-10-CM | POA: Diagnosis not present

## 2017-10-04 DIAGNOSIS — I130 Hypertensive heart and chronic kidney disease with heart failure and stage 1 through stage 4 chronic kidney disease, or unspecified chronic kidney disease: Secondary | ICD-10-CM | POA: Diagnosis not present

## 2017-10-04 DIAGNOSIS — S32401D Unspecified fracture of right acetabulum, subsequent encounter for fracture with routine healing: Secondary | ICD-10-CM | POA: Diagnosis not present

## 2017-10-04 DIAGNOSIS — I255 Ischemic cardiomyopathy: Secondary | ICD-10-CM | POA: Diagnosis not present

## 2017-10-04 DIAGNOSIS — N183 Chronic kidney disease, stage 3 (moderate): Secondary | ICD-10-CM | POA: Diagnosis not present

## 2017-10-04 DIAGNOSIS — I5020 Unspecified systolic (congestive) heart failure: Secondary | ICD-10-CM | POA: Diagnosis not present

## 2017-10-04 MED ORDER — AMIODARONE HCL 200 MG PO TABS: 200.0000 mg | ORAL_TABLET | Freq: Every day | ORAL | 3 refills | Status: DC

## 2017-10-05 DIAGNOSIS — I130 Hypertensive heart and chronic kidney disease with heart failure and stage 1 through stage 4 chronic kidney disease, or unspecified chronic kidney disease: Secondary | ICD-10-CM | POA: Diagnosis not present

## 2017-10-05 DIAGNOSIS — N183 Chronic kidney disease, stage 3 (moderate): Secondary | ICD-10-CM | POA: Diagnosis not present

## 2017-10-05 DIAGNOSIS — I255 Ischemic cardiomyopathy: Secondary | ICD-10-CM | POA: Diagnosis not present

## 2017-10-05 DIAGNOSIS — I2510 Atherosclerotic heart disease of native coronary artery without angina pectoris: Secondary | ICD-10-CM | POA: Diagnosis not present

## 2017-10-05 DIAGNOSIS — I5020 Unspecified systolic (congestive) heart failure: Secondary | ICD-10-CM | POA: Diagnosis not present

## 2017-10-05 DIAGNOSIS — S32401D Unspecified fracture of right acetabulum, subsequent encounter for fracture with routine healing: Secondary | ICD-10-CM | POA: Diagnosis not present

## 2017-10-06 ENCOUNTER — Other Ambulatory Visit: Payer: Self-pay | Admitting: *Deleted

## 2017-10-06 NOTE — Patient Outreach (Signed)
[facility] [facility]) Care Management  10/06/2017  Donald [name] [place] May 29, 1926 [id number]  Transition of care  RN attempted outreach call today however unsuccessful but able to leave a HIPAA approved voice message requesting a call back. WIll further engage at that time based upon pt's needs. Will sent outreach letter and scheduled another outreach attempt for pending [facility] services.  Raina Mina, RN Care Management Coordinator [place] Office [phone]

## 2017-10-07 ENCOUNTER — Other Ambulatory Visit: Payer: Self-pay | Admitting: Pharmacist Clinician (PhC)/ Clinical Pharmacy Specialist

## 2017-10-07 DIAGNOSIS — I5020 Unspecified systolic (congestive) heart failure: Secondary | ICD-10-CM | POA: Diagnosis not present

## 2017-10-07 DIAGNOSIS — I255 Ischemic cardiomyopathy: Secondary | ICD-10-CM | POA: Diagnosis not present

## 2017-10-07 DIAGNOSIS — N183 Chronic kidney disease, stage 3 (moderate): Secondary | ICD-10-CM | POA: Diagnosis not present

## 2017-10-07 DIAGNOSIS — I130 Hypertensive heart and chronic kidney disease with heart failure and stage 1 through stage 4 chronic kidney disease, or unspecified chronic kidney disease: Secondary | ICD-10-CM | POA: Diagnosis not present

## 2017-10-07 DIAGNOSIS — S32401D Unspecified fracture of right acetabulum, subsequent encounter for fracture with routine healing: Secondary | ICD-10-CM | POA: Diagnosis not present

## 2017-10-07 DIAGNOSIS — I2510 Atherosclerotic heart disease of native coronary artery without angina pectoris: Secondary | ICD-10-CM | POA: Diagnosis not present

## 2017-10-07 MED ORDER — AMIODARONE HCL 200 MG PO TABS: 200.0000 mg | ORAL_TABLET | Freq: Every day | ORAL | 3 refills | Status: DC

## 2017-10-08 ENCOUNTER — Other Ambulatory Visit: Payer: Self-pay | Admitting: *Deleted

## 2017-10-08 DIAGNOSIS — S32401D Unspecified fracture of right acetabulum, subsequent encounter for fracture with routine healing: Secondary | ICD-10-CM | POA: Diagnosis not present

## 2017-10-08 DIAGNOSIS — S32424A Nondisplaced fracture of posterior wall of right acetabulum, initial encounter for closed fracture: Secondary | ICD-10-CM | POA: Diagnosis not present

## 2017-10-08 DIAGNOSIS — I5020 Unspecified systolic (congestive) heart failure: Secondary | ICD-10-CM | POA: Diagnosis not present

## 2017-10-08 DIAGNOSIS — N183 Chronic kidney disease, stage 3 (moderate): Secondary | ICD-10-CM | POA: Diagnosis not present

## 2017-10-08 DIAGNOSIS — I255 Ischemic cardiomyopathy: Secondary | ICD-10-CM | POA: Diagnosis not present

## 2017-10-08 DIAGNOSIS — I2510 Atherosclerotic heart disease of native coronary artery without angina pectoris: Secondary | ICD-10-CM | POA: Diagnosis not present

## 2017-10-08 DIAGNOSIS — I130 Hypertensive heart and chronic kidney disease with heart failure and stage 1 through stage 4 chronic kidney disease, or unspecified chronic kidney disease: Secondary | ICD-10-CM | POA: Diagnosis not present

## 2017-10-08 DIAGNOSIS — S32414A Nondisplaced fracture of anterior wall of right acetabulum, initial encounter for closed fracture: Secondary | ICD-10-CM | POA: Diagnosis not present

## 2017-10-08 NOTE — Patient Outreach (Signed)
[place] [facility]) Care Management  10/08/2017  Donald [name] 1927-01-07 [id number]   Transition of care (case closure)  RN spoke with pt today and received an update on his ongoing care. RN reintroduced the [facility] services and again the purpose for today's call. Pt receptive and expressed his involvement with [facility] for PT/OT. Pt states he's "coming along well" with no major issues. RN inquired on his weights related to he history of HF and pt reports he is weighing daily and fimilar with the HF zones verifying he is in the GREEN zone with no fluid retention. RN also inquired on pt's At Fibrillation as pt states he is now fluttering with no major issues and his provider are all aware. Pt aware of when to seek medical attention for these two medical issues if acute symptoms are presented. Pt able to recite all his upcoming appointments with her primary and CAD providers and has sufficient transportation (suuportive daughter Levada Dy).   Will discuss the current plan of care and goals that were generated on the last conversation with the case manager and offer further assistance with home visits if pt feels he needs further one-on-one consultation on his ongoing medical issues (declined). RN also offered to scheduled ongoing transition of care calls weekly over the next few week to inquired on his ongoing recovery. Pt again opt to decline indicating he has service people involved in his care at this time with [facility] and feels he is recovering very well with no issues. Pt states he is managing his care with no problems at this time however grateful for the follow up over the last week. RN verified pt has again the supportive care and no other needs at this time. RN has informed pt that RN will notify his provider that he has opt to decline [facility] services this time. RN will provider contact number and name one again if pt would like to reach out to this RN case manager with no needs in the further.  No further inquires or request at this time as this case will be closed and provider again notified of pt's disposition with [facility].  Raina Mina, RN Care Management Coordinator [place] Office [phone]

## 2017-10-11 DIAGNOSIS — N183 Chronic kidney disease, stage 3 (moderate): Secondary | ICD-10-CM | POA: Diagnosis not present

## 2017-10-11 DIAGNOSIS — I255 Ischemic cardiomyopathy: Secondary | ICD-10-CM | POA: Diagnosis not present

## 2017-10-11 DIAGNOSIS — I5020 Unspecified systolic (congestive) heart failure: Secondary | ICD-10-CM | POA: Diagnosis not present

## 2017-10-11 DIAGNOSIS — S32401D Unspecified fracture of right acetabulum, subsequent encounter for fracture with routine healing: Secondary | ICD-10-CM | POA: Diagnosis not present

## 2017-10-11 DIAGNOSIS — I130 Hypertensive heart and chronic kidney disease with heart failure and stage 1 through stage 4 chronic kidney disease, or unspecified chronic kidney disease: Secondary | ICD-10-CM | POA: Diagnosis not present

## 2017-10-11 DIAGNOSIS — I2510 Atherosclerotic heart disease of native coronary artery without angina pectoris: Secondary | ICD-10-CM | POA: Diagnosis not present

## 2017-10-13 DIAGNOSIS — I255 Ischemic cardiomyopathy: Secondary | ICD-10-CM | POA: Diagnosis not present

## 2017-10-13 DIAGNOSIS — I130 Hypertensive heart and chronic kidney disease with heart failure and stage 1 through stage 4 chronic kidney disease, or unspecified chronic kidney disease: Secondary | ICD-10-CM | POA: Diagnosis not present

## 2017-10-13 DIAGNOSIS — S32401D Unspecified fracture of right acetabulum, subsequent encounter for fracture with routine healing: Secondary | ICD-10-CM | POA: Diagnosis not present

## 2017-10-13 DIAGNOSIS — N183 Chronic kidney disease, stage 3 (moderate): Secondary | ICD-10-CM | POA: Diagnosis not present

## 2017-10-13 DIAGNOSIS — I2510 Atherosclerotic heart disease of native coronary artery without angina pectoris: Secondary | ICD-10-CM | POA: Diagnosis not present

## 2017-10-13 DIAGNOSIS — I5020 Unspecified systolic (congestive) heart failure: Secondary | ICD-10-CM | POA: Diagnosis not present

## 2017-10-14 DIAGNOSIS — M859 Disorder of bone density and structure, unspecified: Secondary | ICD-10-CM | POA: Diagnosis not present

## 2017-10-14 DIAGNOSIS — R7301 Impaired fasting glucose: Secondary | ICD-10-CM | POA: Diagnosis not present

## 2017-10-14 DIAGNOSIS — Z125 Encounter for screening for malignant neoplasm of prostate: Secondary | ICD-10-CM | POA: Diagnosis not present

## 2017-10-14 DIAGNOSIS — R82998 Other abnormal findings in urine: Secondary | ICD-10-CM | POA: Diagnosis not present

## 2017-10-14 DIAGNOSIS — I110 Hypertensive heart disease with heart failure: Secondary | ICD-10-CM | POA: Diagnosis not present

## 2017-10-14 DIAGNOSIS — R946 Abnormal results of thyroid function studies: Secondary | ICD-10-CM | POA: Diagnosis not present

## 2017-10-15 DIAGNOSIS — H04123 Dry eye syndrome of bilateral lacrimal glands: Secondary | ICD-10-CM | POA: Diagnosis not present

## 2017-10-15 DIAGNOSIS — H4032X3 Glaucoma secondary to eye trauma, left eye, severe stage: Secondary | ICD-10-CM | POA: Diagnosis not present

## 2017-10-15 DIAGNOSIS — H52203 Unspecified astigmatism, bilateral: Secondary | ICD-10-CM | POA: Diagnosis not present

## 2017-10-15 DIAGNOSIS — H4720 Unspecified optic atrophy: Secondary | ICD-10-CM | POA: Diagnosis not present

## 2017-10-18 DIAGNOSIS — I2510 Atherosclerotic heart disease of native coronary artery without angina pectoris: Secondary | ICD-10-CM | POA: Diagnosis not present

## 2017-10-18 DIAGNOSIS — I5020 Unspecified systolic (congestive) heart failure: Secondary | ICD-10-CM | POA: Diagnosis not present

## 2017-10-18 DIAGNOSIS — N183 Chronic kidney disease, stage 3 (moderate): Secondary | ICD-10-CM | POA: Diagnosis not present

## 2017-10-18 DIAGNOSIS — S32401D Unspecified fracture of right acetabulum, subsequent encounter for fracture with routine healing: Secondary | ICD-10-CM | POA: Diagnosis not present

## 2017-10-18 DIAGNOSIS — I130 Hypertensive heart and chronic kidney disease with heart failure and stage 1 through stage 4 chronic kidney disease, or unspecified chronic kidney disease: Secondary | ICD-10-CM | POA: Diagnosis not present

## 2017-10-18 DIAGNOSIS — I255 Ischemic cardiomyopathy: Secondary | ICD-10-CM | POA: Diagnosis not present

## 2017-10-19 DIAGNOSIS — I255 Ischemic cardiomyopathy: Secondary | ICD-10-CM | POA: Diagnosis not present

## 2017-10-19 DIAGNOSIS — I130 Hypertensive heart and chronic kidney disease with heart failure and stage 1 through stage 4 chronic kidney disease, or unspecified chronic kidney disease: Secondary | ICD-10-CM | POA: Diagnosis not present

## 2017-10-19 DIAGNOSIS — I5020 Unspecified systolic (congestive) heart failure: Secondary | ICD-10-CM | POA: Diagnosis not present

## 2017-10-19 DIAGNOSIS — S32401D Unspecified fracture of right acetabulum, subsequent encounter for fracture with routine healing: Secondary | ICD-10-CM | POA: Diagnosis not present

## 2017-10-19 DIAGNOSIS — I2510 Atherosclerotic heart disease of native coronary artery without angina pectoris: Secondary | ICD-10-CM | POA: Diagnosis not present

## 2017-10-19 DIAGNOSIS — N183 Chronic kidney disease, stage 3 (moderate): Secondary | ICD-10-CM | POA: Diagnosis not present

## 2017-10-21 DIAGNOSIS — M810 Age-related osteoporosis without current pathological fracture: Secondary | ICD-10-CM | POA: Diagnosis not present

## 2017-10-21 DIAGNOSIS — Z8679 Personal history of other diseases of the circulatory system: Secondary | ICD-10-CM | POA: Diagnosis not present

## 2017-10-21 DIAGNOSIS — Z6826 Body mass index (BMI) 26.0-26.9, adult: Secondary | ICD-10-CM | POA: Diagnosis not present

## 2017-10-21 DIAGNOSIS — Z7901 Long term (current) use of anticoagulants: Secondary | ICD-10-CM | POA: Diagnosis not present

## 2017-10-21 DIAGNOSIS — N183 Chronic kidney disease, stage 3 (moderate): Secondary | ICD-10-CM | POA: Diagnosis not present

## 2017-10-21 DIAGNOSIS — I480 Paroxysmal atrial fibrillation: Secondary | ICD-10-CM | POA: Diagnosis not present

## 2017-10-21 DIAGNOSIS — J449 Chronic obstructive pulmonary disease, unspecified: Secondary | ICD-10-CM | POA: Diagnosis not present

## 2017-10-21 DIAGNOSIS — S32401D Unspecified fracture of right acetabulum, subsequent encounter for fracture with routine healing: Secondary | ICD-10-CM | POA: Diagnosis not present

## 2017-10-21 DIAGNOSIS — D692 Other nonthrombocytopenic purpura: Secondary | ICD-10-CM | POA: Diagnosis not present

## 2017-10-21 DIAGNOSIS — I255 Ischemic cardiomyopathy: Secondary | ICD-10-CM | POA: Diagnosis not present

## 2017-10-21 DIAGNOSIS — I5020 Unspecified systolic (congestive) heart failure: Secondary | ICD-10-CM | POA: Diagnosis not present

## 2017-10-21 DIAGNOSIS — I130 Hypertensive heart and chronic kidney disease with heart failure and stage 1 through stage 4 chronic kidney disease, or unspecified chronic kidney disease: Secondary | ICD-10-CM | POA: Diagnosis not present

## 2017-10-21 DIAGNOSIS — I2510 Atherosclerotic heart disease of native coronary artery without angina pectoris: Secondary | ICD-10-CM | POA: Diagnosis not present

## 2017-10-21 DIAGNOSIS — S329XXS Fracture of unspecified parts of lumbosacral spine and pelvis, sequela: Secondary | ICD-10-CM | POA: Diagnosis not present

## 2017-10-21 DIAGNOSIS — Z0000 Encounter for general adult medical examination without abnormal findings: Secondary | ICD-10-CM | POA: Diagnosis not present

## 2017-10-21 DIAGNOSIS — I110 Hypertensive heart disease with heart failure: Secondary | ICD-10-CM | POA: Diagnosis not present

## 2017-10-21 DIAGNOSIS — Z1389 Encounter for screening for other disorder: Secondary | ICD-10-CM | POA: Diagnosis not present

## 2017-10-21 DIAGNOSIS — I509 Heart failure, unspecified: Secondary | ICD-10-CM | POA: Diagnosis not present

## 2017-10-28 DIAGNOSIS — Z974 Presence of external hearing-aid: Secondary | ICD-10-CM | POA: Diagnosis not present

## 2017-10-28 DIAGNOSIS — H9113 Presbycusis, bilateral: Secondary | ICD-10-CM | POA: Diagnosis not present

## 2017-10-28 DIAGNOSIS — H6123 Impacted cerumen, bilateral: Secondary | ICD-10-CM | POA: Diagnosis not present

## 2017-11-03 ENCOUNTER — Telehealth: Payer: Self-pay | Admitting: Cardiology

## 2017-11-03 NOTE — Telephone Encounter (Signed)
New Message    STAT if patient feels like he/she is going to faint   1) Are you dizzy now? No   2) Do you feel faint or have you passed out? No   3) Do you have any other symptoms?  No   4) Have you checked your HR and BP (record if available)? BP    Patient is experiencing dizziness for the past three days. He mostly experiences it when he stands and move around. Please call.

## 2017-11-03 NOTE — Telephone Encounter (Signed)
Patient not on blood pressure medications.  Would continue to change positions slowly.  Call if symptoms worsen. Kirk Ruths, MD

## 2017-11-03 NOTE — Telephone Encounter (Signed)
Spoke with pt, Aware of dr crenshaw's recommendations.  °

## 2017-11-03 NOTE — Telephone Encounter (Signed)
Spoke with the pt and he has been experiencing some dizziness with rising over the last 3 days. He denies chest pain, palpitations, sob, headache, just feels lightheaded for a few seconds after getting up from sitting. He denies being sick recently and has been eating and drinking well. BP today 129/85 HR 80... I advised him to take a few minutes after getting up in the morning before he gets up to avoid passing out and he says that he has been doing that. He says it could happen anytime during the day even after stting at the table and eating. I advised him that I will froward message to Dr. [name] for his recommendations and he agrees and will call us back if anything worsens.

## 2017-11-19 ENCOUNTER — Telehealth: Payer: Self-pay | Admitting: Cardiology

## 2017-11-19 NOTE — Telephone Encounter (Signed)
Per pt call STATED he feels that the [place] him too dizzie and light headed and has to sit down.   Pt thinks he should discontinue this med please give him a call back.

## 2017-11-19 NOTE — Telephone Encounter (Signed)
Spoke with pt who states for the past 3 weeks he has been feeling very dizzy and light headed after taking the Ranexa. He reports symptoms usually subside around 3 pm. He states his BP has been normal and running around 115/70. He denies any other symptoms. Routing to Dr. [name] for recommendation

## 2017-11-19 NOTE — Telephone Encounter (Signed)
DC ranolazine Kirk Ruths

## 2017-11-22 NOTE — Telephone Encounter (Signed)
Spoke with pt, Aware of dr crenshaw's recommendations.  °

## 2017-11-23 ENCOUNTER — Encounter (HOSPITAL_COMMUNITY): Payer: Self-pay | Admitting: Certified Registered Nurse Anesthetist

## 2017-11-23 ENCOUNTER — Ambulatory Visit (HOSPITAL_COMMUNITY)
Admission: RE | Admit: 2017-11-23 | Discharge: 2017-11-23 | Disposition: A | Discharge status: Home / Self Care | Payer: Medicare Other | Source: Ambulatory Visit | Attending: Cardiology | Admitting: Cardiology

## 2017-11-23 ENCOUNTER — Ambulatory Visit (HOSPITAL_COMMUNITY): Payer: Medicare Other | Admitting: Anesthesiology

## 2017-11-23 ENCOUNTER — Encounter (HOSPITAL_COMMUNITY)
Admission: RE | Disposition: A | Discharge status: Home / Self Care | Payer: Self-pay | Source: Ambulatory Visit | Attending: Cardiology

## 2017-11-23 DIAGNOSIS — Z8781 Personal history of (healed) traumatic fracture: Secondary | ICD-10-CM | POA: Insufficient documentation

## 2017-11-23 DIAGNOSIS — Z8673 Personal history of transient ischemic attack (TIA), and cerebral infarction without residual deficits: Secondary | ICD-10-CM | POA: Diagnosis not present

## 2017-11-23 DIAGNOSIS — Z7901 Long term (current) use of anticoagulants: Secondary | ICD-10-CM | POA: Insufficient documentation

## 2017-11-23 DIAGNOSIS — Z86718 Personal history of other venous thrombosis and embolism: Secondary | ICD-10-CM | POA: Diagnosis not present

## 2017-11-23 DIAGNOSIS — I4892 Unspecified atrial flutter: Secondary | ICD-10-CM | POA: Insufficient documentation

## 2017-11-23 DIAGNOSIS — J45909 Unspecified asthma, uncomplicated: Secondary | ICD-10-CM | POA: Insufficient documentation

## 2017-11-23 DIAGNOSIS — Z951 Presence of aortocoronary bypass graft: Secondary | ICD-10-CM | POA: Insufficient documentation

## 2017-11-23 DIAGNOSIS — I481 Persistent atrial fibrillation: Secondary | ICD-10-CM | POA: Insufficient documentation

## 2017-11-23 DIAGNOSIS — Z8249 Family history of ischemic heart disease and other diseases of the circulatory system: Secondary | ICD-10-CM | POA: Diagnosis not present

## 2017-11-23 DIAGNOSIS — Z87442 Personal history of urinary calculi: Secondary | ICD-10-CM | POA: Diagnosis not present

## 2017-11-23 DIAGNOSIS — Z79899 Other long term (current) drug therapy: Secondary | ICD-10-CM | POA: Insufficient documentation

## 2017-11-23 DIAGNOSIS — Z923 Personal history of irradiation: Secondary | ICD-10-CM | POA: Insufficient documentation

## 2017-11-23 DIAGNOSIS — Z800 Family history of malignant neoplasm of digestive organs: Secondary | ICD-10-CM | POA: Insufficient documentation

## 2017-11-23 DIAGNOSIS — I5042 Chronic combined systolic (congestive) and diastolic (congestive) heart failure: Secondary | ICD-10-CM | POA: Diagnosis not present

## 2017-11-23 DIAGNOSIS — I255 Ischemic cardiomyopathy: Secondary | ICD-10-CM | POA: Insufficient documentation

## 2017-11-23 DIAGNOSIS — Z9889 Other specified postprocedural states: Secondary | ICD-10-CM | POA: Diagnosis not present

## 2017-11-23 DIAGNOSIS — Z8546 Personal history of malignant neoplasm of prostate: Secondary | ICD-10-CM | POA: Insufficient documentation

## 2017-11-23 DIAGNOSIS — I350 Nonrheumatic aortic (valve) stenosis: Secondary | ICD-10-CM | POA: Insufficient documentation

## 2017-11-23 DIAGNOSIS — I2510 Atherosclerotic heart disease of native coronary artery without angina pectoris: Secondary | ICD-10-CM | POA: Diagnosis not present

## 2017-11-23 DIAGNOSIS — I4819 Other persistent atrial fibrillation: Secondary | ICD-10-CM

## 2017-11-23 DIAGNOSIS — E785 Hyperlipidemia, unspecified: Secondary | ICD-10-CM | POA: Diagnosis not present

## 2017-11-23 DIAGNOSIS — I5041 Acute combined systolic (congestive) and diastolic (congestive) heart failure: Secondary | ICD-10-CM | POA: Diagnosis not present

## 2017-11-23 HISTORY — PX: CARDIOVERSION: SHX1299

## 2017-11-23 SURGERY — CARDIOVERSION
Anesthesia: General

## 2017-11-23 MED ORDER — SODIUM CHLORIDE 0.9% FLUSH: 3.0000 mL | Freq: Two times a day (BID) | INTRAVENOUS | Status: DC

## 2017-11-23 MED ORDER — PROPOFOL 10 MG/ML IV BOLUS
INTRAVENOUS | Status: DC | PRN
  Administered 2017-11-23: 70 mg via INTRAVENOUS

## 2017-11-23 MED ORDER — SODIUM CHLORIDE 0.9% FLUSH: 3.0000 mL | INTRAVENOUS | Status: DC | PRN

## 2017-11-23 MED ORDER — SODIUM CHLORIDE 0.9 % IV SOLN
250.0000 mL | INTRAVENOUS | Status: DC
  Administered 2017-11-23: 250 mL via INTRAVENOUS

## 2017-11-23 MED ORDER — EPHEDRINE SULFATE-NACL 50-0.9 MG/10ML-% IV SOSY
PREFILLED_SYRINGE | INTRAVENOUS | Status: DC | PRN
  Administered 2017-11-23: 10 mg via INTRAVENOUS

## 2017-11-23 MED ORDER — LIDOCAINE 2% (20 MG/ML) 5 ML SYRINGE
INTRAMUSCULAR | Status: DC | PRN
  Administered 2017-11-23: 80 mg via INTRAVENOUS

## 2017-11-23 MED ORDER — PHENYLEPHRINE 40 MCG/ML (10ML) SYRINGE FOR IV PUSH (FOR BLOOD PRESSURE SUPPORT)
PREFILLED_SYRINGE | INTRAVENOUS | Status: DC | PRN
  Administered 2017-11-23: 120 ug via INTRAVENOUS

## 2017-11-23 NOTE — Anesthesia Preprocedure Evaluation (Addendum)
Anesthesia Evaluation  Patient identified by MRN, date of birth, ID band Patient awake    Reviewed: Allergy & Precautions, NPO status , Patient's Chart, lab work & pertinent test results  Airway Mallampati: II  TM Distance: >3 FB Neck ROM: Full    Dental no notable dental hx. (+) Dental Advisory Given, Teeth Intact   Pulmonary asthma ,    Pulmonary exam normal breath sounds clear to auscultation       Cardiovascular + CAD, + CABG and +CHF  Normal cardiovascular exam+ dysrhythmias Atrial Fibrillation + Valvular Problems/Murmurs MR  Rhythm:Irregular Rate:Normal  TTE 05/2017 - Left ventricle: The cavity size was normal. Wall thickness was increased in a pattern of mild LVH. Systolic function was mildly to moderately reduced. The estimated ejection fraction was in the range of 40% to 45%. Akinesis of the apical myocardium. - Ventricular septum: The contour showed systolic flattening. These changes are consistent with RV volume and pressure overload. - Aortic valve: Trileaflet; normal thickness, mildly calcified   leaflets. Transvalvular velocity was increased. There was mild stenosis. - Aortic root: The aortic root was mildly dilated. - Mitral valve: Mildly calcified annulus. There was moderate to severe regurgitation. - Left atrium: The atrium was severely dilated. - Right ventricle: The cavity size was dilated. Wall thickness was normal. - Right atrium: The atrium was severely dilated. - Tricuspid valve: There was moderate regurgitation.  Stress Test 2018 Defect in distal anterior, distal inferior and apical walls consistent with small region of scar. Inferior thinning consistent with soft tissue attenuation and/or scar No ischemia. Nuclear stress EF: 46%. This is an intermediate risk study.   Neuro/Psych CVA negative psych ROS   GI/Hepatic negative GI ROS, Neg liver ROS,   Endo/Other  negative endocrine ROS   Renal/GU negative Renal ROS  negative genitourinary   Musculoskeletal  (+) Arthritis , Osteoarthritis,    Abdominal   Peds  Hematology  (+) anemia ,   Anesthesia Other Findings On eliquis  Reproductive/Obstetrics                            Anesthesia Physical Anesthesia Plan  ASA: III  Anesthesia Plan: General   Post-op Pain Management:    Induction: Intravenous  PONV Risk Score and Plan: 2 and Treatment may vary due to age or medical condition and Propofol infusion  Airway Management Planned: Natural Airway and Simple Face Mask  Additional Equipment:   Intra-op Plan:   Post-operative Plan:   Informed Consent: I have reviewed the patients History and Physical, chart, labs and discussed the procedure including the risks, benefits and alternatives for the proposed anesthesia with the patient or authorized representative who has indicated his/her understanding and acceptance.   Dental advisory given  Plan Discussed with: CRNA  Anesthesia Plan Comments:         Anesthesia Quick Evaluation

## 2017-11-23 NOTE — Transfer of Care (Signed)
Immediate Anesthesia Transfer of Care Note  Patient: Donald [name]  Procedure(s) Performed: CARDIOVERSION (N/A )  Patient Location: Endoscopy Unit  Anesthesia Type:General  Level of Consciousness: awake, alert  and oriented  Airway & Oxygen Therapy: Patient Spontanous Breathing  Post-op Assessment: Report given to RN and Post -op Vital signs reviewed and stable  Post vital signs: Reviewed and stable  Last Vitals:  Vitals Value Taken Time  BP 117/70 11/23/2017  9:56 AM  Temp    Pulse 66 11/23/2017  9:58 AM  Resp 13 11/23/2017  9:58 AM  SpO2 99 % 11/23/2017  9:58 AM    Last Pain:  Vitals:   11/23/17 0902  TempSrc: Oral  PainSc: 0-No pain         Complications: No apparent anesthesia complications

## 2017-11-23 NOTE — Anesthesia Procedure Notes (Signed)
Procedure Name: General with mask airway Date/Time: 11/23/2017 9:45 AM Performed by: Candis Shine, CRNA Pre-anesthesia Checklist: Patient identified, Emergency Drugs available, Suction available, Patient being monitored and Timeout performed Patient Re-evaluated:Patient Re-evaluated prior to induction Oxygen Delivery Method: Ambu bag Preoxygenation: Pre-oxygenation with 100% oxygen Induction Type: IV induction Dental Injury: Teeth and Oropharynx as per pre-operative assessment

## 2017-11-23 NOTE — H&P (Signed)
Office Visit   09/27/2017 [facility] Wendy Poet, MD  Cardiology   Persistent atrial fibrillation ([place]) +4 more  Dx   Follow-up , Coronary Artery Disease , Congestive Heart Failure ; Referred by [name], [name], MD  Reason for Visit   Additional Documentation   Vitals:   BP 120/64   Pulse 81   Ht 5\' 9"  (1.753 m)   Wt 77.5 kg   BMI 25.22 kg/m   BSA 1.94 m     More Vitals   Flowsheets:   Anthropometrics,   MEWS Score     Encounter Info:   Billing Info,   History,   Allergies,   Detailed Report     All Notes   Procedures by Troy Sine, MD at 09/27/2017 9:40 AM  Author: Troy Sine, MD Author Type: Physician Filed: 09/28/2017 9:40 AM  Note Status: Signed Cosign: Cosign Not Required Encounter Date: 09/27/2017  Editor: Lucilla Lame B  Procedure Orders:  1. EKG 12-Lead [[id number]] ordered by Lelon Perla, MD at 09/27/17 1009         Scan on 09/28/2017 9:40 AM by Cyd Silence NORTHLINEScan on 09/28/2017 9:40 AM by Natale Milch  Progress Notes by Lelon Perla, MD at 09/27/2017 9:40 AM  Author: Lelon Perla, MD Author Type: Physician Filed: 09/27/2017 10:14 AM  Note Status: Signed Cosign: Cosign Not Required Encounter Date: 09/27/2017  Editor: Lelon Perla, MD (Physician)         HPI: FU CAD, CHF and atrial fibrillation. He is status post coronary bypassing graft in 1990; also with h/o ischemic cardiomyopathy, prior stroke and prior mural apical thrombus on chronic coumadin Rx. Prior abdominal ultrasound in May 2006 showed no aneurysm. Carotid Dopplers in November of 2005 showed 0-39% bilaterally. MRA in October of 2007 showed no obstructive disease. LHC 09/24/11: LM 30, oLAD 100%, dLAD fills via RV branch off RCA, IM diffuse 95% subtotal disease, mCFX 40%, OM1 normal, RCA normal with large RV branch supplying dLAD, PLA possibly occluded and most dRCA appears to fill from LIMA collats; LIMA  patent but does not supply dLAD (connects via small vessel to ? DRCA/PLA, S-IM occluded). Circulation felt to be stable. Continue med Rx recommended.  Nuclear study 2018 showed ejection fraction 46%.  There was infarct but no ischemia.  Last echocardiogram March 2019 showed ejection fraction 40 to 45% with akinesis of the apex.  There was mild aortic stenosis, moderate to severe mitral regurgitation, severe biatrial enlargement, mild right ventricular enlargement and moderate tricuspid regurgitation.  The hope was that mitral regurgitation would improve if sinus reestablished.  If not MitraClip could be considered.  Patient with recent complicated course including diagnosis of atrial fibrillation, problems with congestive heart failure.  Patient had a fall in April 2019 and suffered a small subdural hematoma.  He was ultimately also diagnosed with right pelvic fracture treated conservatively. CTA showed occlusion of the right popliteal artery of indeterminate chronicity; followed by vascular surgery.  Patient also with hypotension/orthostasis and cardiac medications have been held.  Since last seen, patient has limited mobility because of recent hip fracture but this seems to be improving.  He denies dyspnea, chest pain or syncope.  He has mild edema and left ankle.  He is taking Lasix as needed for increase weight above 170 pounds and this appears to be controlling his overall fluid status.        Current Outpatient  Medications  Medication Sig Dispense Refill  . acetaminophen (TYLENOL) 325 MG tablet Take 2 tablets (650 mg total) by mouth every 6 (six) hours as needed for mild pain (or Fever >/= 101).    Marland Kitchen amiodarone (PACERONE) 200 MG tablet Take 1 tablet (200 mg total) by mouth daily.    Marland Kitchen apixaban (ELIQUIS) 5 MG TABS tablet Take 1 tablet (5 mg total) by mouth 2 (two) times daily. 60 tablet   . Cholecalciferol (VITAMIN D) 2000 UNITS tablet Take 2,000 Units by mouth daily.    . Fluticasone  Furoate-Vilanterol (BREO ELLIPTA) 100-25 MCG/INH AEPB Inhale 1 puff into the lungs daily as needed (shortness of breath).     . furosemide (LASIX) 40 MG tablet Take 1 tablet (40 mg total) by mouth daily as needed for fluid or edema. 60 tablet 5  . hydrocortisone cream 1 % Apply 1 application topically 3 (three) times daily as needed for itching (skin irritation). 30 g 0  . latanoprost (XALATAN) 0.005 % ophthalmic solution Place 1 drop into the left eye at bedtime.     Marland Kitchen loratadine (CLARITIN) 10 MG tablet Take 10 mg by mouth daily.    . Multiple Vitamin (MULTIVITAMIN WITH MINERALS) TABS tablet Take 1 tablet by mouth daily.    Marland Kitchen oxyCODONE (OXY IR/ROXICODONE) 5 MG immediate release tablet Take 1-2 tablets (5-10 mg total) by mouth every 4 (four) hours as needed for moderate pain. 6 tablet 0  . ranolazine (RANEXA) 500 MG 12 hr tablet Take 1 tablet (500 mg total) by mouth 2 (two) times daily. 60 tablet 5  . senna (SENOKOT) 8.6 MG TABS tablet Take 1 tablet by mouth at bedtime.    . Tamsulosin HCl (FLOMAX) 0.4 MG CAPS Take 0.4 mg by mouth every evening.      No current facility-administered medications for this visit.          Past Medical History:  Diagnosis Date  . Asthma    with worsening with beta blockade  . Atrial fibrillation ([place])   . CAD (coronary artery disease)   . CVA (cerebral infarction) 2012   tia, mild no residual defecits  . GI bleed 8 yrs ago   due to doll fully vessel which was clipped  . Glaucoma    excellent cataracts  . History of kidney stones   . Ischemic cardiomyopathy   . Moderate to severe mitral regurgitation 06/16/2017  . Nephrolithiasis   . Post-infarction apical thrombus ([place])   . Prostate cancer ([place]) 2012  . Radiation proctitis Feb 2015   treated with APC  . Tubular adenoma 04/2013   Dr. Hilarie Fredrickson         Past Surgical History:  Procedure Laterality Date  . cardiac stents  2003  . CARDIOVERSION N/A 06/15/2017    Procedure: CARDIOVERSION;  Surgeon: Acie Fredrickson, Wonda Cheng, MD;  Location: [place];  Service: Cardiovascular;  Laterality: N/A;  . CARDIOVERSION N/A 07/15/2017   Procedure: CARDIOVERSION;  Surgeon: Jerline Pain, MD;  Location: [facility] ENDOSCOPY;  Service: Cardiovascular;  Laterality: N/A;  . CARDIOVERSION N/A 07/20/2017   Procedure: CARDIOVERSION;  Surgeon: Herminio Commons, MD;  Location: [facility] ENDOSCOPY;  Service: Cardiovascular;  Laterality: N/A;  . COLONOSCOPY WITH PROPOFOL N/A 05/05/2013   Procedure: COLONOSCOPY WITH PROPOFOL;  Surgeon: Jerene Bears, MD;  Location: [facility] ENDOSCOPY;  Service: Gastroenterology;  Laterality: N/A;  . CORONARY ARTERY BYPASS GRAFT  1998   LIMA to the LAD and saphenous vein graft tothe diagonal  . EXTRACORPOREAL SHOCK  WAVE LITHOTRIPSY Right 04/29/2017   Procedure: RIGHT EXTRACORPOREAL SHOCK WAVE LITHOTRIPSY (ESWL);  Surgeon: Alexis Frock, MD;  Location: [facility];  Service: Urology;  Laterality: Right;  . history of radiation treatment  2012   x 40 treatments done  . KNEE ARTHROSCOPY  2016 or 2017   Dr Gladstone Lighter ;   . PARTIAL KNEE ARTHROPLASTY Right 01/13/2017   Procedure: UNICOMPARTMENTAL RIGHT KNEE;  Surgeon: Gaynelle Arabian, MD;  Location: [facility];  Service: Orthopedics;  Laterality: Right;  with block    Social History        Socioeconomic History  . Marital status: Widowed    Spouse name: Not on file  . Number of children: 3  . Years of education: Not on file  . Highest education level: Not on file  Occupational History  . Occupation: Retired Conservation officer, [employer]  Social Needs  . Financial resource strain: Not on file  . Food insecurity:    Worry: Not on file    Inability: Not on file  . Transportation needs:    Medical: Not on file    Non-medical: Not on file  Tobacco Use  . Smoking status: Never Smoker  . Smokeless tobacco: Never Used  Substance and Sexual Activity  . Alcohol use: Yes    Alcohol/week: 0.0 oz    Comment:  occasional beer or wine  . Drug use: No  . Sexual activity: Not Currently    Birth control/protection: Abstinence  Lifestyle  . Physical activity:    Days per week: Not on file    Minutes per session: Not on file  . Stress: Not on file  Relationships  . Social connections:    Talks on phone: Not on file    Gets together: Not on file    Attends religious service: Not on file    Active member of club or organization: Not on file    Attends meetings of clubs or organizations: Not on file    Relationship status: Not on file  . Intimate partner violence:    Fear of current or ex partner: Not on file    Emotionally abused: Not on file    Physically abused: Not on file    Forced sexual activity: Not on file  Other Topics Concern  . Not on file  Social History Narrative  . Not on file         Family History  Problem Relation Age of Onset  . Heart attack Father   . Heart disease Mother   . Stomach cancer Mother     ROS: Hip pain but no fevers or chills, productive cough, hemoptysis, dysphasia, odynophagia, melena, hematochezia, dysuria, hematuria, rash, seizure activity, orthopnea, PND, claudication. Remaining systems are negative.  Physical Exam: Well-developed well-nourished in no acute distress.  Skin is warm and dry.  HEENT is normal.  Neck is supple.  Chest is clear to auscultation with normal expansion.  Cardiovascular exam is irregular Abdominal exam nontender or distended. No masses palpated. Extremities show 1+ edema left ankle. neuro grossly intact  ECG-atrial flutter, right bundle branch block.  Personally reviewed  A/P  1 coronary artery disease-patient is not on aspirin given need for anticoagulation.  Continue statin.  He has not had chest pain.  2 persistent atrial fibrillation/flutter-patient remains in atrial flutter today although tolerating well.  I would like to reestablish sinus rhythm to see if this would help  his congestive heart failure symptoms.  He is on amiodarone and ranolazine.  Continue apixaban 5  mg twice daily.  He is compliant and has not missed doses.  We will arrange cardioversion in late August.  Hopefully he will maintain sinus rhythm.  3 chronic combined systolic/diastolic congestive heart failure-patient appears to be doing well from a volume standpoint with only mild edema in left ankle.  We discussed fluid restriction and low-sodium diet.  He will continue Lasix as needed.  He takes this for weight gain of 3 pounds above 170.  Check potassium and renal function.  4 ischemic cardiomyopathy-cardiac medications previously held because of hypotension.  We will reassess to see if he will tolerate after we reestablish sinus rhythm.  5 history of mural thrombus at the apex of left ventricle-continue apixaban.  6 moderate to severe mitral regurgitation-we will plan to reassess once we reestablish sinus rhythm.  Hopefully this will improve.  7 status post hip fracture-he will follow-up with orthopedics.  8 occluded right popliteal artery-he is not having pain in his right lower extremity.  I will arrange follow-up with vascular surgery which was scheduled at discharge.  Continue apixaban.  Kirk Ruths, MD     For DCCV; compliant with apixaban. Kirk Ruths

## 2017-11-23 NOTE — Anesthesia Postprocedure Evaluation (Signed)
Anesthesia Post Note  Patient: Donald [name]  Procedure(s) Performed: CARDIOVERSION (N/A )     Patient location during evaluation: PACU Anesthesia Type: General Level of consciousness: awake and alert Pain management: pain level controlled Vital Signs Assessment: post-procedure vital signs reviewed and stable Respiratory status: spontaneous breathing, nonlabored ventilation, respiratory function stable and patient connected to nasal cannula oxygen Cardiovascular status: blood pressure returned to baseline and stable Postop Assessment: no apparent nausea or vomiting Anesthetic complications: no    Last Vitals:  Vitals:   11/23/17 1010 11/23/17 1020  BP: 106/62 101/60  Pulse: 64 64  Resp: 13 12  Temp:    SpO2: 96% 98%    Last Pain:  Vitals:   11/23/17 1001  TempSrc: Oral  PainSc: 0-No pain                 Damonie Furney L Taviana Westergren

## 2017-11-23 NOTE — Procedures (Signed)
Electrical Cardioversion Procedure Note MANOLITO JUREWICZ [id number] 1926-07-11  Procedure: Electrical Cardioversion Indications:  Atrial Fibrillation  Procedure Details Consent: Risks of procedure as well as the alternatives and risks of each were explained to the (patient/caregiver).  Consent for procedure obtained. Time Out: Verified patient identification, verified procedure, site/side was marked, verified correct patient position, special equipment/implants available, medications/allergies/relevent history reviewed, required imaging and test results available.  Performed  Patient placed on cardiac monitor, pulse oximetry, supplemental oxygen as necessary.  Sedation given: Pt sedated by anesthesia with lidocaine 80 mg and diprovan 70 mg IV. Pacer pads placed anterior and posterior chest.  Cardioverted 1 time(s).  Cardioverted at 150J.  Evaluation Findings: Post procedure EKG shows: NSR Complications: None Patient did tolerate procedure well.   Kirk Ruths 11/23/2017, 9:38 AM

## 2017-11-23 NOTE — Discharge Instructions (Signed)
Electrical Cardioversion, Care After °This sheet gives you information about how to care for yourself after your procedure. Your health care provider may also give you more specific instructions. If you have problems or questions, contact your health care provider. °What can I expect after the procedure? °After the procedure, it is common to have: °· Some redness on the skin where the shocks were given. ° °Follow these instructions at home: °· Do not drive for 24 hours if you were given a medicine to help you relax (sedative). °· Take over-the-counter and prescription medicines only as told by your health care provider. °· Ask your health care provider how to check your pulse. Check it often. °· Rest for 48 hours after the procedure or as told by your health care provider. °· Avoid or limit your caffeine use as told by your health care provider. °Contact a health care provider if: °· You feel like your heart is beating too quickly or your pulse is not regular. °· You have a serious muscle cramp that does not go away. °Get help right away if: °· You have discomfort in your chest. °· You are dizzy or you feel faint. °· You have trouble breathing or you are short of breath. °· Your speech is slurred. °· You have trouble moving an arm or leg on one side of your body. °· Your fingers or toes turn cold or blue. °This information is not intended to replace advice given to you by your health care provider. Make sure you discuss any questions you have with your health care provider. °Document Released: 01/04/2013 Document Revised: 10/18/2015 Document Reviewed: 09/20/2015 °Elsevier Interactive Patient Education © 2018 [employer] ° °

## 2017-11-23 NOTE — Interval H&P Note (Signed)
History and Physical Interval Note:  11/23/2017 9:37 AM  Donald [name]  has presented today for surgery, with the diagnosis of A-FIB  The various methods of treatment have been discussed with the patient and family. After consideration of risks, benefits and other options for treatment, the patient has consented to  Procedure(s): CARDIOVERSION (N/A) as a surgical intervention .  The patient's history has been reviewed, patient examined, no change in status, stable for surgery.  I have reviewed the patient's chart and labs.  Questions were answered to the patient's satisfaction.     Kirk Ruths

## 2017-12-14 ENCOUNTER — Other Ambulatory Visit: Payer: Self-pay | Admitting: Pharmacist

## 2017-12-14 ENCOUNTER — Telehealth: Payer: Self-pay | Admitting: Cardiology

## 2017-12-14 MED ORDER — APIXABAN 5 MG PO TABS: 5.0000 mg | ORAL_TABLET | Freq: Two times a day (BID) | ORAL | 1 refills | Status: DC

## 2017-12-14 NOTE — Telephone Encounter (Signed)
FU as scheduled Donald [name]  

## 2017-12-14 NOTE — Telephone Encounter (Signed)
New Message      Patient is calling today and would like a call back concerning medications and doctor's visits. Pls call and advise.

## 2017-12-14 NOTE — Telephone Encounter (Signed)
Returned call to pt he states his AFIB/Flutter has come back and will re-start his amiodarone and he already has an appt scheduled w/Dr [name] and would like to know if there is anything else he should do until his appt 12-30-2017? Please advise

## 2017-12-16 NOTE — Telephone Encounter (Signed)
Patient made aware and verbalized understanding.

## 2017-12-24 NOTE — Progress Notes (Signed)
HPI: FU CAD, CHF and atrial fibrillation. He is status post coronary bypassing graft in 1990; also with h/o ischemic cardiomyopathy, prior stroke and prior mural apical thrombus on chronic coumadin Rx. Prior abdominal ultrasound in May 2006 showed no aneurysm. Carotid Dopplers in November of 2005 showed 0-39% bilaterally. MRA in October of 2007 showed no obstructive disease. LHC 09/24/11: LM 30, oLAD 100%, dLAD fills via RV branch off RCA, IM diffuse 95% subtotal disease, mCFX 40%, OM1 normal, RCA normal with large RV branch supplying dLAD, PLA possibly occluded and most dRCA appears to fill from LIMA collats; LIMA patent but does not supply dLAD (connects via small vessel to ? DRCA/PLA, S-IM occluded). Circulation felt to be stable. Continue med Rx recommended.  Nuclear study 2018 showed ejection fraction 46%.  There was infarct but no ischemia.  Last echocardiogram March 2019 showed ejection fraction 40 to 45% with akinesis of the apex.  There was mild aortic stenosis, moderate to severe mitral regurgitation, severe biatrial enlargement, mild right ventricular enlargement and moderate tricuspid regurgitation.  The hope was that mitral regurgitation would improve if sinus reestablished.  If not MitraClip could be considered.  Patient with recent complicated course including diagnosis of atrial fibrillation, problems with congestive heart failure.  Patient had a fall in April 2019 and suffered a small subdural hematoma.  He was ultimately also diagnosed with right pelvic fracture treated conservatively. CTA showed occlusion of the right popliteal artery of indeterminate chronicity; followed by vascular surgery.  Patient also with hypotension/orthostasis and cardiac medications have been held.    Patient had successful cardioversion on November 23, 2017.  Since last seen,the patient has dyspnea with more extreme activities but not with routine activities. It is relieved with rest. It is not associated with  chest pain. There is no orthopnea, PND or pedal edema. There is no syncope or palpitations. There is no exertional chest pain.   Current Outpatient Medications  Medication Sig Dispense Refill  . alendronate (FOSAMAX) 70 MG tablet Take 70 mg by mouth every Sunday. Take with a full glass of water on an empty stomach.    Marland Kitchen amiodarone (PACERONE) 200 MG tablet Take 1 tablet (200 mg total) by mouth daily. 90 tablet 3  . apixaban (ELIQUIS) 5 MG TABS tablet Take 1 tablet (5 mg total) by mouth 2 (two) times daily. 180 tablet 1  . Cholecalciferol (VITAMIN D) 2000 UNITS tablet Take 2,000 Units by mouth daily.    . Fluticasone Furoate-Vilanterol (BREO ELLIPTA) 100-25 MCG/INH AEPB Inhale 1 puff into the lungs daily as needed (shortness of breath).     . furosemide (LASIX) 40 MG tablet Take 1 tablet (40 mg total) by mouth daily as needed for fluid or edema. 60 tablet 5  . latanoprost (XALATAN) 0.005 % ophthalmic solution Place 1 drop into the left eye at bedtime.     Marland Kitchen loratadine (CLARITIN) 10 MG tablet Take 10 mg by mouth daily.    . Multiple Vitamin (MULTIVITAMIN WITH MINERALS) TABS tablet Take 1 tablet by mouth daily.    . Tamsulosin HCl (FLOMAX) 0.4 MG CAPS Take 0.4 mg by mouth every evening.      No current facility-administered medications for this visit.      Past Medical History:  Diagnosis Date  . Asthma    with worsening with beta blockade  . Atrial fibrillation ([place])   . CAD (coronary artery disease)   . CVA (cerebral infarction) 2012   tia, mild no residual  defecits  . GI bleed 8 yrs ago   due to doll fully vessel which was clipped  . Glaucoma    excellent cataracts  . History of kidney stones   . Ischemic cardiomyopathy   . Moderate to severe mitral regurgitation 06/16/2017  . Nephrolithiasis   . Post-infarction apical thrombus ([place])   . Prostate cancer ([place]) 2012  . Radiation proctitis Feb 2015   treated with APC  . Tubular adenoma 04/2013   Dr. Hilarie Fredrickson    Past Surgical  History:  Procedure Laterality Date  . cardiac stents  2003  . CARDIOVERSION N/A 06/15/2017   Procedure: CARDIOVERSION;  Surgeon: Acie Fredrickson, Wonda Cheng, MD;  Location: [place];  Service: Cardiovascular;  Laterality: N/A;  . CARDIOVERSION N/A 07/15/2017   Procedure: CARDIOVERSION;  Surgeon: Jerline Pain, MD;  Location: [facility] ENDOSCOPY;  Service: Cardiovascular;  Laterality: N/A;  . CARDIOVERSION N/A 07/20/2017   Procedure: CARDIOVERSION;  Surgeon: Herminio Commons, MD;  Location: [facility] ENDOSCOPY;  Service: Cardiovascular;  Laterality: N/A;  . CARDIOVERSION N/A 11/23/2017   Procedure: CARDIOVERSION;  Surgeon: Lelon Perla, MD;  Location: [place];  Service: Cardiovascular;  Laterality: N/A;  . COLONOSCOPY WITH PROPOFOL N/A 05/05/2013   Procedure: COLONOSCOPY WITH PROPOFOL;  Surgeon: Jerene Bears, MD;  Location: [facility] ENDOSCOPY;  Service: Gastroenterology;  Laterality: N/A;  . CORONARY ARTERY BYPASS GRAFT  1998   LIMA to the LAD and saphenous vein graft tothe diagonal  . EXTRACORPOREAL SHOCK WAVE LITHOTRIPSY Right 04/29/2017   Procedure: RIGHT EXTRACORPOREAL SHOCK WAVE LITHOTRIPSY (ESWL);  Surgeon: Alexis Frock, MD;  Location: [facility];  Service: Urology;  Laterality: Right;  . history of radiation treatment  2012   x 40 treatments done  . KNEE ARTHROSCOPY  2016 or 2017   Dr Gladstone Lighter ;   . PARTIAL KNEE ARTHROPLASTY Right 01/13/2017   Procedure: UNICOMPARTMENTAL RIGHT KNEE;  Surgeon: Gaynelle Arabian, MD;  Location: [facility];  Service: Orthopedics;  Laterality: Right;  with block    Social History   Socioeconomic History  . Marital status: Widowed    Spouse name: Not on file  . Number of children: 3  . Years of education: Not on file  . Highest education level: Not on file  Occupational History  . Occupation: Retired Conservation officer, [employer]  Social Needs  . Financial resource strain: Not on file  . Food insecurity:    Worry: Not on file    Inability: Not on file  . Transportation needs:     Medical: Not on file    Non-medical: Not on file  Tobacco Use  . Smoking status: Never Smoker  . Smokeless tobacco: Never Used  Substance and Sexual Activity  . Alcohol use: Yes    Alcohol/week: 0.0 standard drinks    Comment: occasional beer or wine  . Drug use: No  . Sexual activity: Not Currently    Birth control/protection: Abstinence  Lifestyle  . Physical activity:    Days per week: Not on file    Minutes per session: Not on file  . Stress: Not on file  Relationships  . Social connections:    Talks on phone: Not on file    Gets together: Not on file    Attends religious service: Not on file    Active member of club or organization: Not on file    Attends meetings of clubs or organizations: Not on file    Relationship status: Not on file  . Intimate partner violence:  Fear of current or ex partner: Not on file    Emotionally abused: Not on file    Physically abused: Not on file    Forced sexual activity: Not on file  Other Topics Concern  . Not on file  Social History Narrative  . Not on file    Family History  Problem Relation Age of Onset  . Heart attack Father   . Heart disease Mother   . Stomach cancer Mother     ROS: no fevers or chills, productive cough, hemoptysis, dysphasia, odynophagia, melena, hematochezia, dysuria, hematuria, rash, seizure activity, orthopnea, PND, pedal edema, claudication. Remaining systems are negative.  Physical Exam: Well-developed well-nourished in no acute distress.  Skin is warm and dry.  HEENT is normal.  Neck is supple.  Chest is clear to auscultation with normal expansion.  Cardiovascular exam is regular rate and rhythm.  Abdominal exam nontender or distended. No masses palpated. Extremities show no edema. neuro grossly intact  ECG-sinus bradycardia at a rate of 63, right bundle branch block.  Personally reviewed  A/P  1 paroxysmal atrial fibrillation-patient remains in sinus rhythm today status post  cardioversion.  We will continue with amiodarone and apixaban.  Check chest x-ray.  2 coronary artery disease-continue statin.  No aspirin given need for anticoagulation.  3 chronic combined systolic/diastolic congestive heart failure-patient is not volume overloaded on examination.  He will continue Lasix as needed.  4 ischemic cardiomyopathy-blood pressure remains borderline and he has had some problems with orthostatic symptoms.  We will consider ARB or ACE inhibitor and beta-blocker in the future if blood pressure allows.  5 history of mural thrombus-continue apixaban.  6 moderate to severe mitral regurgitation-we will plan to repeat echocardiogram in 3 months to reassess now that sinus is reestablished.  7 occluded right popliteal artery-follow-up vascular surgery.  8 history of mild aortic stenosis-he will need follow-up echoes in the future.  Kirk Ruths, MD

## 2017-12-29 ENCOUNTER — Other Ambulatory Visit: Payer: Self-pay | Admitting: *Deleted

## 2017-12-29 ENCOUNTER — Ambulatory Visit (INDEPENDENT_AMBULATORY_CARE_PROVIDER_SITE_OTHER): Payer: Medicare Other | Admitting: Cardiology

## 2017-12-29 ENCOUNTER — Encounter: Payer: Self-pay | Admitting: Cardiology

## 2017-12-29 VITALS — BP 104/62 | HR 63 | Ht 69.0 in | Wt 177.0 lb

## 2017-12-29 DIAGNOSIS — I480 Paroxysmal atrial fibrillation: Secondary | ICD-10-CM | POA: Diagnosis not present

## 2017-12-29 DIAGNOSIS — I2510 Atherosclerotic heart disease of native coronary artery without angina pectoris: Secondary | ICD-10-CM | POA: Diagnosis not present

## 2017-12-29 DIAGNOSIS — I70201 Unspecified atherosclerosis of native arteries of extremities, right leg: Secondary | ICD-10-CM

## 2017-12-29 DIAGNOSIS — I5022 Chronic systolic (congestive) heart failure: Secondary | ICD-10-CM

## 2017-12-29 DIAGNOSIS — I340 Nonrheumatic mitral (valve) insufficiency: Secondary | ICD-10-CM

## 2017-12-29 NOTE — Patient Instructions (Signed)
Medication Instructions:   NO CHANGE  Testing/Procedures:  Your physician has requested that you have an echocardiogram. Echocardiography is a painless test that uses sound waves to create images of your heart. It provides your doctor with information about the size and shape of your heart and how well your heart's chambers and valves are working. This procedure takes approximately one hour. There are no restrictions for this procedure.SCHEDULE IN 3 MONTHS  A chest x-ray takes a picture of the organs and structures inside the chest, including the heart, lungs, and blood vessels. This test can show several things, including, whether the heart is enlarges; whether fluid is building up in the lungs; and whether pacemaker / defibrillator leads are still in place. AT [facility]    Follow-Up:  Your physician recommends that you schedule a follow-up appointment in: 3 MONTHS WITH DR [name] AFTER ECHO COMPLETE   If you need a refill on your cardiac medications before your next appointment, please call your pharmacy.

## 2018-01-17 ENCOUNTER — Telehealth: Payer: Self-pay | Admitting: Cardiology

## 2018-01-17 MED ORDER — AMIODARONE HCL 200 MG PO TABS: 100.0000 mg | ORAL_TABLET | Freq: Every day | ORAL | 3 refills | Status: DC

## 2018-01-17 NOTE — Telephone Encounter (Signed)
Decrease amiodarone to 100 mg daily; if symptoms persist will DC. Kirk Ruths

## 2018-01-17 NOTE — Telephone Encounter (Signed)
Spoke with pt, Aware of dr crenshaw's recommendations.  °

## 2018-01-17 NOTE — Telephone Encounter (Signed)
Called patient, he states that since starting on the Amiodarone medication he has noticed he is dizzy, tremors, falling a lot, constipated, and in the past 2 months it has worsened. He has fallen 4 times in the past 4 weeks. He states his heart rate is back into normal rhythm, and would like to know if he can cut it back or stop it? Patient states his BP yesterday was 115/70 and his HR this morning was 56, and then rechecked a little later and it was 70. Please advise for patient, thank you!

## 2018-01-17 NOTE — Telephone Encounter (Signed)
New message    Pt is calling asking for a call back. He said he is having trouble with his medications.  Pt c/o medication issue:  1. Name of Medication: amiodarone   2. How are you currently taking this medication (dosage and times per day)? 200 mg  3. Are you having a reaction (difficulty breathing--STAT)? Dizzy, constipation, shaking  4. What is your medication issue? Pt states that he thinks this medication is causing him to fall.

## 2018-01-17 NOTE — Telephone Encounter (Signed)
Forwarded to Dr.  [name]

## 2018-01-31 DIAGNOSIS — S40011A Contusion of right shoulder, initial encounter: Secondary | ICD-10-CM | POA: Diagnosis not present

## 2018-01-31 DIAGNOSIS — M7541 Impingement syndrome of right shoulder: Secondary | ICD-10-CM | POA: Diagnosis not present

## 2018-03-28 NOTE — Progress Notes (Signed)
HPI: FU CAD, CHF and atrial fibrillation. He is status post coronary bypassing graft in 1990; also with h/o ischemic cardiomyopathy, prior stroke and prior mural apical thrombus on chronic coumadin Rx. Prior abdominal ultrasound in May 2006 showed no aneurysm. Carotid Dopplers in November of 2005 showed 0-39% bilaterally. MRA in October of 2007 showed no obstructive disease. LHC 09/24/11: LM 30, oLAD 100%, dLAD fills via RV branch off RCA, IM diffuse 95% subtotal disease, mCFX 40%, OM1 normal, RCA normal with large RV branch supplying dLAD, PLA possibly occluded and most dRCA appears to fill from LIMA collats; LIMA patent but does not supply dLAD (connects via small vessel to ? DRCA/PLA, S-IM occluded). Circulation felt to be stable. Continue med Rx recommended.Nuclear study 2018 showed ejection fraction 46%. There was infarct but no ischemia. Last echocardiogram March 2019 showed ejection fraction 40 to 45% with akinesis of the apex. There was mild aortic stenosis, moderate to severe mitral regurgitation, severe biatrial enlargement, mild right ventricular enlargement and moderate tricuspid regurgitation. The hope was that mitral regurgitation would improve if sinus reestablished. If not MitraClip could be considered. Patient with recent complicated course including diagnosis of atrial fibrillation,problems with congestive heart failure. Patient had a fall in April 2019 and suffered a small subdural hematoma. He was ultimately also diagnosed with right pelvic fracture treated conservatively. CTA showed occlusion of the right popliteal artery of indeterminate chronicity; followed by vascular surgery. Patient also with hypotension/orthostasis and cardiac medications have been held.   Patient had successful cardioversion on November 23, 2017.    Amiodarone decreased to 100 mg daily October 2019 as patient complained of dizziness, tremors, following and constipation.  Follow-up echocardiogram  January 2020 showed ejection fraction 35 to 40%, reduced flow at the apex with swirling, mild to moderate mitral regurgitation, biatrial enlargement and moderate to severe tricuspid regurgitation.  Since last seen,patient denies dyspnea, chest pain, palpitations or syncope.  Current Outpatient Medications  Medication Sig Dispense Refill  . alendronate (FOSAMAX) 70 MG tablet Take 70 mg by mouth every Sunday. Take with a full glass of water on an empty stomach.    Marland Kitchen amiodarone (PACERONE) 200 MG tablet Take 0.5 tablets (100 mg total) by mouth daily. 90 tablet 3  . apixaban (ELIQUIS) 5 MG TABS tablet Take 1 tablet (5 mg total) by mouth 2 (two) times daily. 180 tablet 1  . Cholecalciferol (VITAMIN D) 2000 UNITS tablet Take 2,000 Units by mouth daily.    . fluticasone furoate-vilanterol (BREO ELLIPTA) 100-25 MCG/INH AEPB Inhale 1 puff into the lungs daily as needed (shortness of breath).     . furosemide (LASIX) 40 MG tablet Take 1 tablet (40 mg total) by mouth daily as needed for fluid or edema. 60 tablet 5  . latanoprost (XALATAN) 0.005 % ophthalmic solution Place 1 drop into the left eye at bedtime.     Marland Kitchen loratadine (CLARITIN) 10 MG tablet Take 10 mg by mouth daily.    . Multiple Vitamin (MULTIVITAMIN WITH MINERALS) TABS tablet Take 1 tablet by mouth daily.    . Tamsulosin HCl (FLOMAX) 0.4 MG CAPS Take 0.4 mg by mouth every evening.      No current facility-administered medications for this visit.      Past Medical History:  Diagnosis Date  . Asthma    with worsening with beta blockade  . Atrial fibrillation ([place])   . CAD (coronary artery disease)   . CVA (cerebral infarction) 2012   tia, mild no residual  defecits  . GI bleed 8 yrs ago   due to doll fully vessel which was clipped  . Glaucoma    excellent cataracts  . History of kidney stones   . Ischemic cardiomyopathy   . Moderate to severe mitral regurgitation 06/16/2017  . Nephrolithiasis   . Post-infarction apical thrombus ([place])     . Prostate cancer ([place]) 2012  . Radiation proctitis Feb 2015   treated with APC  . Tubular adenoma 04/2013   Dr. Hilarie Fredrickson    Past Surgical History:  Procedure Laterality Date  . cardiac stents  2003  . CARDIOVERSION N/A 06/15/2017   Procedure: CARDIOVERSION;  Surgeon: Acie Fredrickson, Wonda Cheng, MD;  Location: [place];  Service: Cardiovascular;  Laterality: N/A;  . CARDIOVERSION N/A 07/15/2017   Procedure: CARDIOVERSION;  Surgeon: Jerline Pain, MD;  Location[facility] ENDOSCOPY;  Service: Cardiovascular;  Laterality: N/A;  . CARDIOVERSION N/A 07/20/2017   Procedure: CARDIOVERSION;  Surgeon: Herminio Commons, MD;  Location[facility] ENDOSCOPY;  Service: Cardiovascular;  Laterality: N/A;  . CARDIOVERSION N/A 11/23/2017   Procedure: CARDIOVERSION;  Surgeon: Lelon Perla, MD;  Location: [place];  Service: Cardiovascular;  Laterality: N/A;  . COLONOSCOPY WITH PROPOFOL N/A 05/05/2013   Procedure: COLONOSCOPY WITH PROPOFOL;  Surgeon: Jerene Bears, MD;  Location: [facility] ENDOSCOPY;  Service: Gastroenterology;  Laterality: N/A;  . CORONARY ARTERY BYPASS GRAFT  1998   LIMA to the LAD and saphenous vein graft tothe diagonal  . EXTRACORPOREAL SHOCK WAVE LITHOTRIPSY Right 04/29/2017   Procedure: RIGHT EXTRACORPOREAL SHOCK WAVE LITHOTRIPSY (ESWL);  Surgeon: Alexis Frock, MD;  Location: [facility];  Service: Urology;  Laterality: Right;  . history of radiation treatment  2012   x 40 treatments done  . KNEE ARTHROSCOPY  2016 or 2017   Dr Gladstone Lighter ;   . PARTIAL KNEE ARTHROPLASTY Right 01/13/2017   Procedure: UNICOMPARTMENTAL RIGHT KNEE;  Surgeon: Gaynelle Arabian, MD;  Location: [facility];  Service: Orthopedics;  Laterality: Right;  with block    Social History   Socioeconomic History  . Marital status: Widowed    Spouse name: Not on file  . Number of children: 3  . Years of education: Not on file  . Highest education level: Not on file  Occupational History  . Occupation: Retired Conservation officer, [employer]  Social  Needs  . Financial resource strain: Not on file  . Food insecurity:    Worry: Not on file    Inability: Not on file  . Transportation needs:    Medical: Not on file    Non-medical: Not on file  Tobacco Use  . Smoking status: Never Smoker  . Smokeless tobacco: Never Used  Substance and Sexual Activity  . Alcohol use: Yes    Alcohol/week: 0.0 standard drinks    Comment: occasional beer or wine  . Drug use: No  . Sexual activity: Not Currently    Birth control/protection: Abstinence  Lifestyle  . Physical activity:    Days per week: Not on file    Minutes per session: Not on file  . Stress: Not on file  Relationships  . Social connections:    Talks on phone: Not on file    Gets together: Not on file    Attends religious service: Not on file    Active member of club or organization: Not on file    Attends meetings of clubs or organizations: Not on file    Relationship status: Not on file  . Intimate partner violence:  Fear of current or ex partner: Not on file    Emotionally abused: Not on file    Physically abused: Not on file    Forced sexual activity: Not on file  Other Topics Concern  . Not on file  Social History Narrative  . Not on file    Family History  Problem Relation Age of Onset  . Heart attack Father   . Heart disease Mother   . Stomach cancer Mother     ROS: no fevers or chills, productive cough, hemoptysis, dysphasia, odynophagia, melena, hematochezia, dysuria, hematuria, rash, seizure activity, orthopnea, PND, pedal edema, claudication. Remaining systems are negative.  Physical Exam: Well-developed well-nourished in no acute distress.  Skin is warm and dry.  HEENT is normal.  Neck is supple.  Chest is clear to auscultation with normal expansion.  Cardiovascular exam is tachycardic Abdominal exam nontender or distended. No masses palpated. Extremities show no edema. neuro grossly intact  ECG-atrial flutter with PVC or aberrantly conducted  beat, right bundle branch block, left posterior fascicular block.  Personally reviewed  A/P  1 paroxysmal atrial fibrillation-Pt in atrial flutter today; rate mildly elevated; add toprol 12.5 mg daily.  Watch blood pressure closely as he has had problems with orthostasis.  Continue amiodarone and apixaban.  Given recurrent atrial arrhythmias I would favor rate control if possible.  Check hemoglobin, renal function, TSH and liver functions. Check chest xray.  2 coronary artery disease-resume Lipitor 40 mg daily.  Check lipids and liver in 4 weeks.  He is not on aspirin given need for anticoagulation.  3 chronic systolic/diastolic congestive heart failure-patient will continue Lasix as needed.  His volume status is good today.  4 ischemic cardiomyopathy-patient has had problems with orthostasis and his blood pressure is borderline.  I have not added an ACE inhibitor for that reason.  I will add low-dose Toprol to see if he tolerates for his atrial flutter with mildly elevated rate.  We will consider ACEI in the future if blood pressure allows.  5 history of mural thrombus-continue apixaban.  Check hemoglobin and renal function.  6 moderate to severe mitral regurgitation-mild to moderate on follow-up echocardiogram.  7 occluded right popliteal artery-patient is followed by vascular surgery.  8 history of mild aortic stenosis  Kirk Ruths, MD

## 2018-04-04 ENCOUNTER — Other Ambulatory Visit: Payer: Self-pay

## 2018-04-04 ENCOUNTER — Ambulatory Visit (HOSPITAL_COMMUNITY): Payer: Medicare Other | Attending: Cardiology

## 2018-04-04 VITALS — BP 120/77

## 2018-04-04 DIAGNOSIS — I480 Paroxysmal atrial fibrillation: Secondary | ICD-10-CM

## 2018-04-04 MED ORDER — PERFLUTREN LIPID MICROSPHERE
1.0000 mL | INTRAVENOUS | Status: AC | PRN
  Administered 2018-04-04: 1 mL via INTRAVENOUS

## 2018-04-11 ENCOUNTER — Encounter: Payer: Self-pay | Admitting: Cardiology

## 2018-04-11 ENCOUNTER — Ambulatory Visit (HOSPITAL_COMMUNITY)
Admission: RE | Admit: 2018-04-11 | Discharge: 2018-04-11 | Disposition: A | Discharge status: Home / Self Care | Payer: Medicare Other | Source: Ambulatory Visit | Attending: Cardiology | Admitting: Cardiology

## 2018-04-11 ENCOUNTER — Ambulatory Visit (INDEPENDENT_AMBULATORY_CARE_PROVIDER_SITE_OTHER): Payer: Medicare Other | Admitting: Cardiology

## 2018-04-11 VITALS — BP 100/70 | HR 113 | Ht 69.0 in | Wt 172.4 lb

## 2018-04-11 DIAGNOSIS — I5022 Chronic systolic (congestive) heart failure: Secondary | ICD-10-CM

## 2018-04-11 DIAGNOSIS — J90 Pleural effusion, not elsewhere classified: Secondary | ICD-10-CM | POA: Diagnosis not present

## 2018-04-11 DIAGNOSIS — I2510 Atherosclerotic heart disease of native coronary artery without angina pectoris: Secondary | ICD-10-CM | POA: Diagnosis not present

## 2018-04-11 DIAGNOSIS — I480 Paroxysmal atrial fibrillation: Secondary | ICD-10-CM | POA: Insufficient documentation

## 2018-04-11 MED ORDER — METOPROLOL SUCCINATE ER 25 MG PO TB24: 12.5000 mg | ORAL_TABLET | Freq: Every day | ORAL | 3 refills | Status: DC

## 2018-04-11 NOTE — Patient Instructions (Signed)
Medication Instructions:  START METOPROLOL SUCC ER 12.5 MG ONCE DAILY= 1/2 OF THE 25 MG TABLET ONCE DAILY If you need a refill on your cardiac medications before your next appointment, please call your pharmacy.   Lab work: Your physician recommends that you HAVE LAB WORK TODAY If you have labs (blood work) drawn today and your tests are completely normal, you will receive your results only by: Marland Kitchen MyChart Message (if you have MyChart) OR . A paper copy in the mail If you have any lab test that is abnormal or we need to change your treatment, we will call you to review the results.  Testing/Procedures: A chest x-ray takes a picture of the organs and structures inside the chest, including the heart, lungs, and blood vessels. This test can show several things, including, whether the heart is enlarges; whether fluid is building up in the lungs; and whether pacemaker / defibrillator leads are still in place. AT [place]: At [facility], you and your health needs are our priority.  As part of our continuing mission to provide you with exceptional heart care, we have created designated Provider Care Teams.  These Care Teams include your primary Cardiologist (physician) and Advanced Practice Providers (APPs -  Physician Assistants and Nurse Practitioners) who all work together to provide you with the care you need, when you need it.  Your physician recommends that you schedule a follow-up appointment in: [place]

## 2018-04-12 ENCOUNTER — Telehealth: Payer: Self-pay | Admitting: *Deleted

## 2018-04-12 DIAGNOSIS — R7989 Other specified abnormal findings of blood chemistry: Secondary | ICD-10-CM

## 2018-04-12 LAB — COMPREHENSIVE METABOLIC PANEL
ALBUMIN: 3.8 g/dL (ref 3.5–4.7)
ALT: 15 IU/L (ref 0–44)
AST: 21 IU/L (ref 0–40)
Albumin/Globulin Ratio: 1.7 (ref 1.2–2.2)
Alkaline Phosphatase: 69 IU/L (ref 39–117)
BUN / CREAT RATIO: 17 (ref 10–24)
BUN: 22 mg/dL (ref 8–27)
Bilirubin Total: 0.5 mg/dL (ref 0.0–1.2)
CALCIUM: 9.2 mg/dL (ref 8.6–10.2)
CO2: 25 mmol/L (ref 20–29)
CREATININE: 1.3 mg/dL — AB (ref 0.76–1.27)
Chloride: 98 mmol/L (ref 96–106)
GFR, EST AFRICAN AMERICAN: 56 mL/min/{1.73_m2} — AB (ref >59)
GFR, EST NON AFRICAN AMERICAN: 48 mL/min/{1.73_m2} — AB (ref >59)
GLUCOSE: 81 mg/dL (ref 65–99)
Globulin, Total: 2.3 g/dL (ref 1.5–4.5)
Potassium: 4.3 mmol/L (ref 3.5–5.2)
Sodium: 142 mmol/L (ref 134–144)
TOTAL PROTEIN: 6.1 g/dL (ref 6.0–8.5)

## 2018-04-12 LAB — CBC
HEMOGLOBIN: 15.6 g/dL (ref 13.0–17.7)
Hematocrit: 46.2 % (ref 37.5–51.0)
MCH: 32.1 pg (ref 26.6–33.0)
MCHC: 33.8 g/dL (ref 31.5–35.7)
MCV: 95 fL (ref 79–97)
PLATELETS: 232 10*3/uL (ref 150–450)
RBC: 4.86 x10E6/uL (ref 4.14–5.80)
RDW: 12.6 % (ref 11.6–15.4)
WBC: 5.2 10*3/uL (ref 3.4–10.8)

## 2018-04-12 LAB — TSH: TSH: 14.08 u[IU]/mL — ABNORMAL HIGH (ref 0.450–4.500)

## 2018-04-12 MED ORDER — LEVOTHYROXINE SODIUM 25 MCG PO TABS: 25.0000 ug | ORAL_TABLET | Freq: Every day | ORAL | 3 refills | Status: DC

## 2018-04-12 NOTE — Telephone Encounter (Addendum)
Message sent to patient via my chart. New script sent to the pharmacy and Lab orders mailed to the pt.   ----- Message from Lelon Perla, MD sent at 04/12/2018  7:27 AM EST ----- Synthroid 25 micrograms daily; TSH and free T4 12 weeks Kirk Ruths

## 2018-04-25 DIAGNOSIS — H4720 Unspecified optic atrophy: Secondary | ICD-10-CM | POA: Diagnosis not present

## 2018-04-25 DIAGNOSIS — Z961 Presence of intraocular lens: Secondary | ICD-10-CM | POA: Diagnosis not present

## 2018-04-25 DIAGNOSIS — H04123 Dry eye syndrome of bilateral lacrimal glands: Secondary | ICD-10-CM | POA: Diagnosis not present

## 2018-05-05 IMAGING — CT CT HEAD W/O CM
4 series · 15 of 47 positions shown, 17 images · non-contrast
Comparison: Prior CT from 07/24/2017.

CLINICAL DATA: Follow-up examination for subdural hemorrhage.

EXAM:
CT HEAD WITHOUT CONTRAST
TECHNIQUE: Contiguous axial images were obtained from the base of the skull
through the vertex without intravenous contrast.

[Series 3: head wo · axial · 0.41mm/px · z∈[-166,-46]mm · 7 of 34 slices shown, 9 images]
[im 5/34  brain]
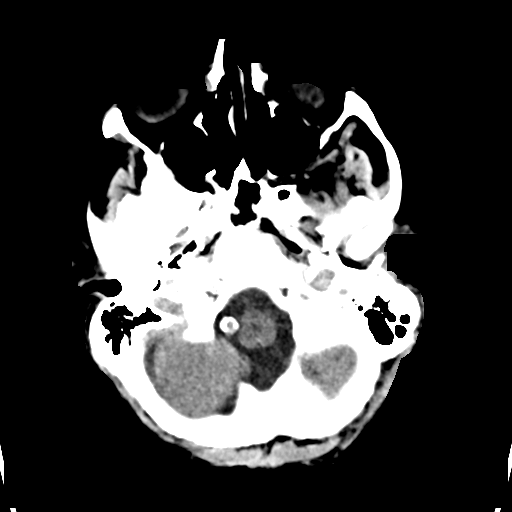
[im 5/34  bone]
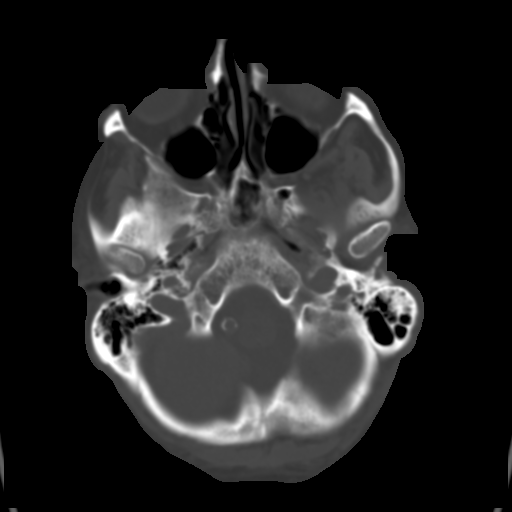
[im 9/34  brain]
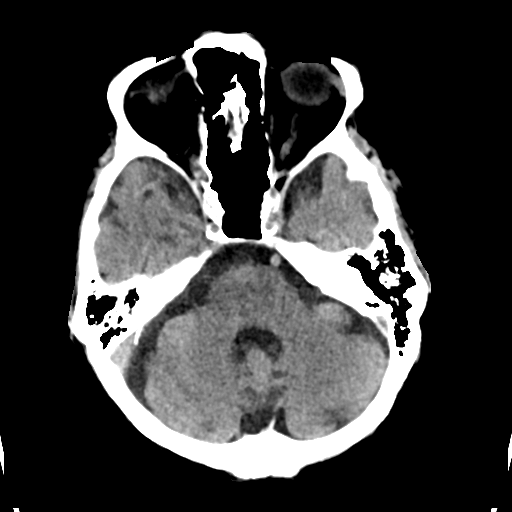
[im 13/34  brain]
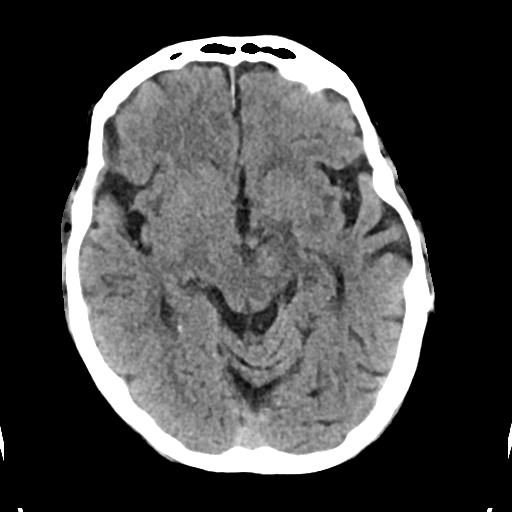
[im 17/34  brain]
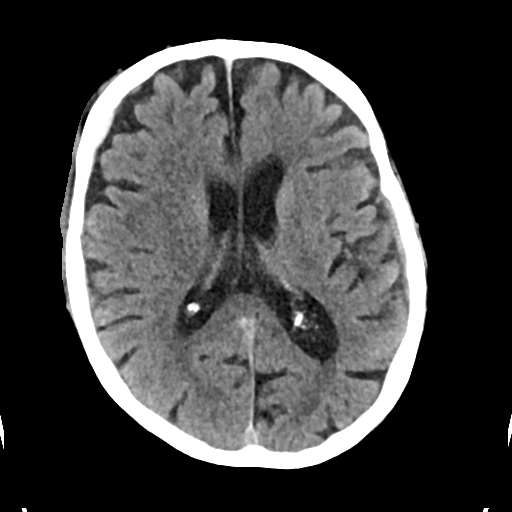
[im 21/34  brain]
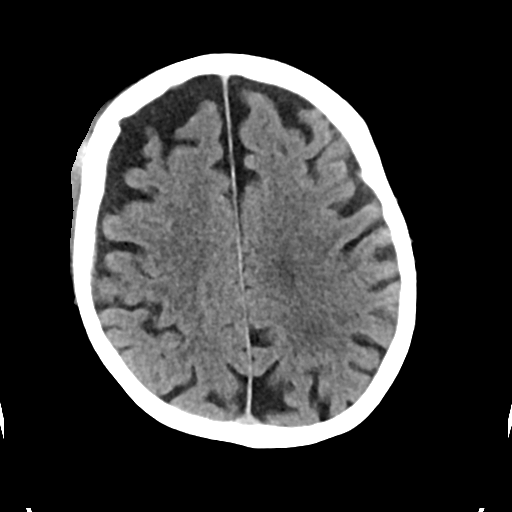
[im 21/34  bone]
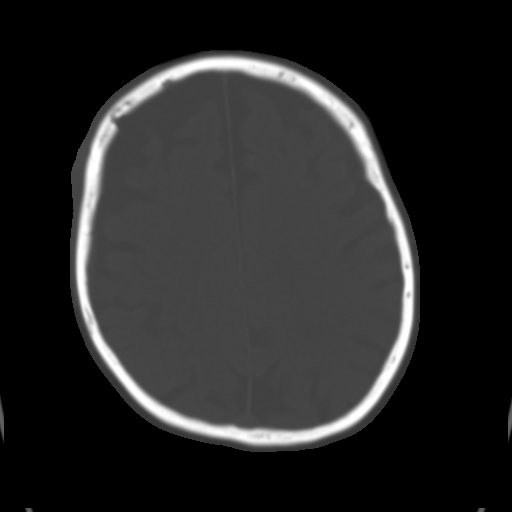
[im 25/34  brain]
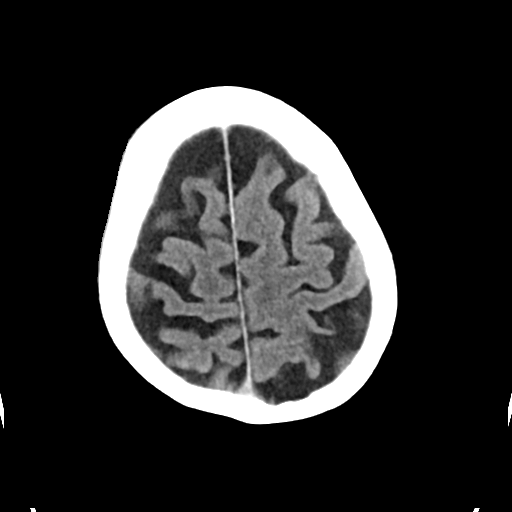
[im 29/34  brain]
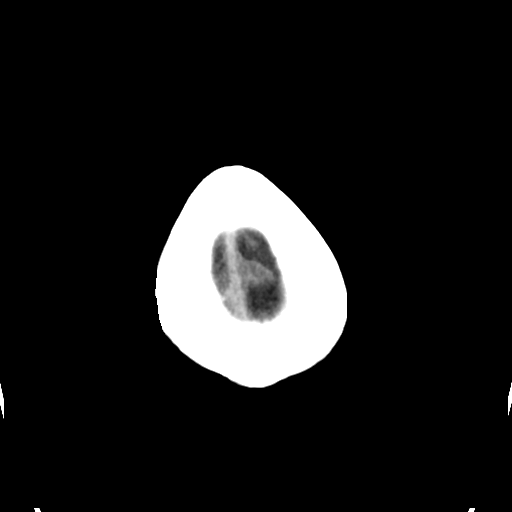

[Series 4: head bone · axial · 0.41mm/px · z∈[-170,-154]mm · 2 of 84 slices shown]
[im 9/84  bone]
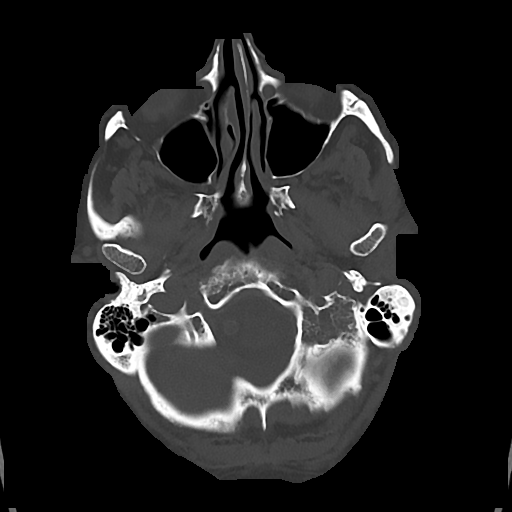
[im 17/84  bone]
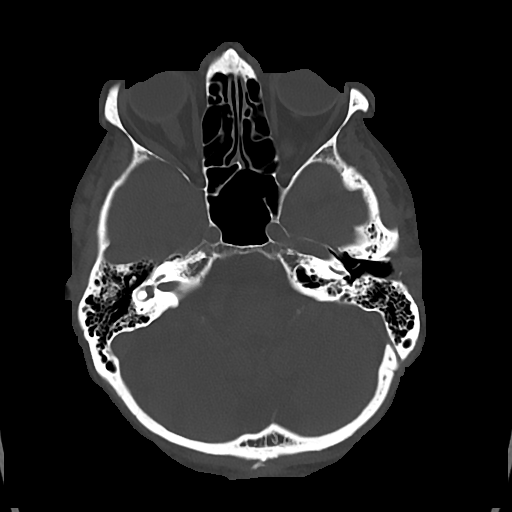

[Series 5: cor soft · coronal · 0.33mm/px · 3 of 62 slices shown]
[im 21/62  brain]
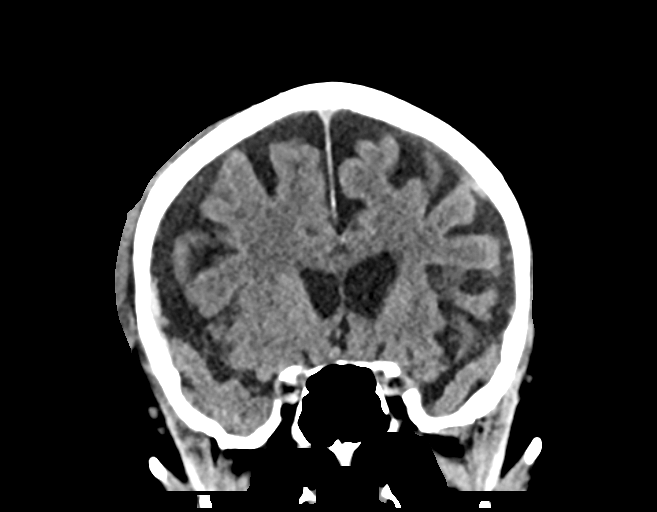
[im 28/62  brain]
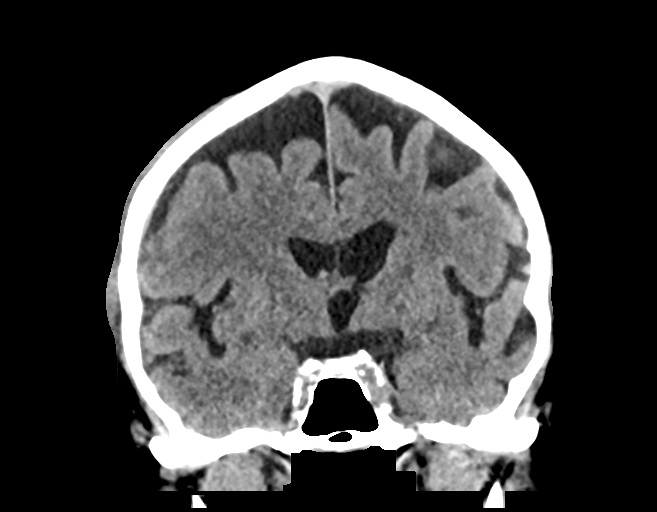
[im 34/62  brain]
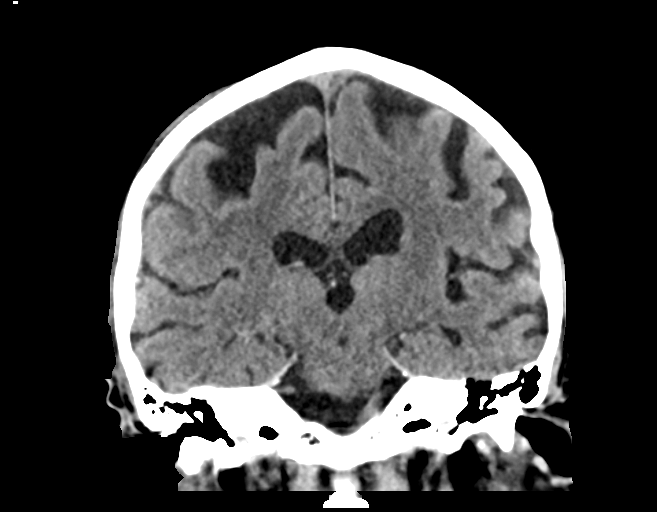

[Series 6: sag soft · sagittal · 0.33mm/px · 3 of 53 slices shown]
[im 18/53  brain]
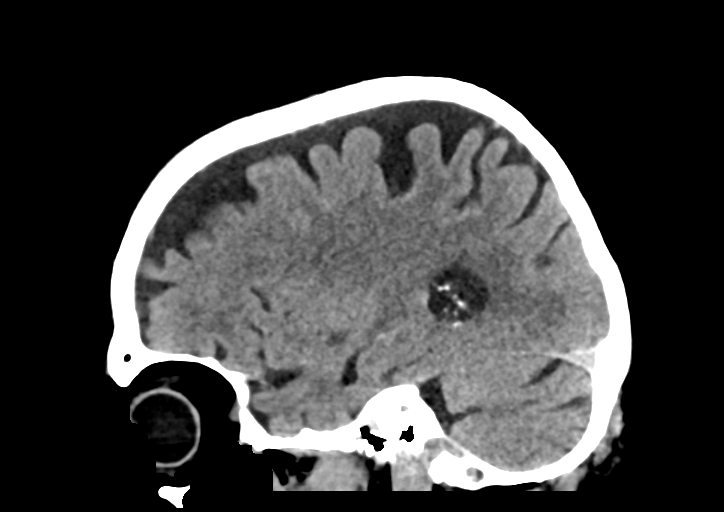
[im 27/53  brain]
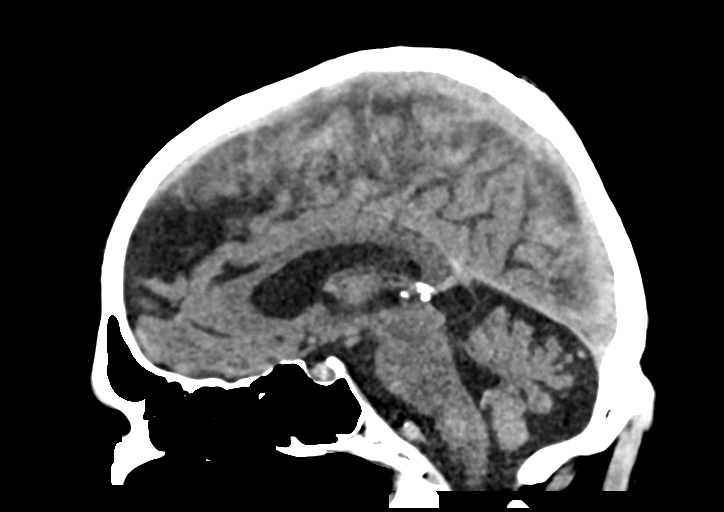
[im 35/53  brain]
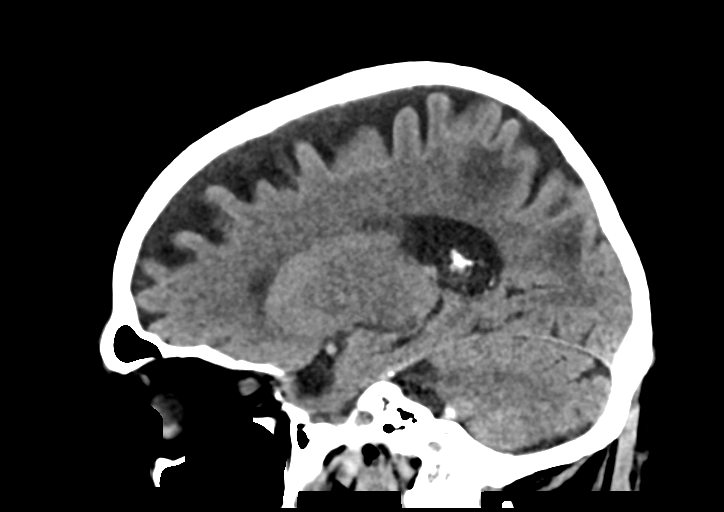

[15 of 47 positions shown; findings below may reference images not displayed]

FINDINGS: Brain: Previously identified small right subdural hematoma is stable
to slightly decreased in size from previous, measuring up to 4 mm in
maximal thickness, similar to previous. No evidence for interval
bleeding or expansion. No new acute intracranial hemorrhage. Stable
atrophy with chronic small vessel ischemic disease. Remote left
parietal infarct noted. No acute large vessel territory infarct. No
mass lesion or midline shift. No hydrocephalus.

Vascular: No hyperdense vessel. Calcified atherosclerosis at the
skull base.

Skull: Evolving right frontal scalp contusion.  Calvarium intact.

Sinuses/Orbits: No acute abnormality. Paranasal sinuses are largely
clear. No mastoid effusion.

Other: None.
IMPRESSION: 1. Stable to slightly decreased size of small right subdural
hematoma measuring up to 4 mm in maximal thickness. No significant
mass effect.
2. No other new acute intracranial abnormality.
3. Evolving right frontal scalp contusion.

## 2018-05-23 DIAGNOSIS — Z8546 Personal history of malignant neoplasm of prostate: Secondary | ICD-10-CM | POA: Diagnosis not present

## 2018-05-23 DIAGNOSIS — N200 Calculus of kidney: Secondary | ICD-10-CM | POA: Diagnosis not present

## 2018-05-23 DIAGNOSIS — C61 Malignant neoplasm of prostate: Secondary | ICD-10-CM | POA: Diagnosis not present

## 2018-05-23 DIAGNOSIS — N401 Enlarged prostate with lower urinary tract symptoms: Secondary | ICD-10-CM | POA: Diagnosis not present

## 2018-05-23 DIAGNOSIS — R351 Nocturia: Secondary | ICD-10-CM | POA: Diagnosis not present

## 2018-06-27 ENCOUNTER — Other Ambulatory Visit: Payer: Self-pay | Admitting: Cardiology

## 2018-06-27 NOTE — Telephone Encounter (Signed)
Age 85, weight 78kg, SCr 1.3 drawn 04/11/18.

## 2018-07-06 DIAGNOSIS — I480 Paroxysmal atrial fibrillation: Secondary | ICD-10-CM | POA: Diagnosis not present

## 2018-07-06 DIAGNOSIS — I5022 Chronic systolic (congestive) heart failure: Secondary | ICD-10-CM | POA: Diagnosis not present

## 2018-07-06 DIAGNOSIS — R7989 Other specified abnormal findings of blood chemistry: Secondary | ICD-10-CM | POA: Diagnosis not present

## 2018-07-06 DIAGNOSIS — I2510 Atherosclerotic heart disease of native coronary artery without angina pectoris: Secondary | ICD-10-CM | POA: Diagnosis not present

## 2018-07-06 LAB — TSH+FREE T4
Free T4: 1.17 ng/dL (ref 0.82–1.77)
TSH: 8.13 u[IU]/mL — ABNORMAL HIGH (ref 0.450–4.500)

## 2018-07-11 ENCOUNTER — Other Ambulatory Visit: Payer: Self-pay | Admitting: *Deleted

## 2018-07-11 DIAGNOSIS — R7989 Other specified abnormal findings of blood chemistry: Secondary | ICD-10-CM

## 2018-07-12 ENCOUNTER — Ambulatory Visit: Payer: Medicare Other | Admitting: Cardiology

## 2018-07-15 ENCOUNTER — Telehealth: Payer: Self-pay | Admitting: Cardiology

## 2018-07-15 NOTE — Telephone Encounter (Signed)
Pt saw that Dr. [name] requested lab work done. He wants to know if it is OK to wait until a few days before his July appt to get lab work. He doesn't want to leave the house unless he has to.

## 2018-07-18 NOTE — Telephone Encounter (Signed)
Spoke with pt, okay to wait for labs.

## 2018-08-18 ENCOUNTER — Other Ambulatory Visit (HOSPITAL_COMMUNITY): Payer: Self-pay | Admitting: Nurse Practitioner

## 2018-09-29 ENCOUNTER — Telehealth: Payer: Self-pay

## 2018-09-29 NOTE — Telephone Encounter (Signed)

## 2018-10-03 DIAGNOSIS — R7989 Other specified abnormal findings of blood chemistry: Secondary | ICD-10-CM | POA: Diagnosis not present

## 2018-10-03 DIAGNOSIS — I480 Paroxysmal atrial fibrillation: Secondary | ICD-10-CM | POA: Diagnosis not present

## 2018-10-04 LAB — TSH+FREE T4
Free T4: 1.15 ng/dL (ref 0.82–1.77)
TSH: 5.91 u[IU]/mL — ABNORMAL HIGH (ref 0.450–4.500)

## 2018-10-05 NOTE — Progress Notes (Signed)
Virtual Visit via Video Note   This visit type was conducted due to national recommendations for restrictions regarding the COVID-19 Pandemic (e.g. social distancing) in an effort to limit this patient's exposure and mitigate transmission in our community.  Due to his co-morbid illnesses, this patient is at least at moderate risk for complications without adequate follow up.  This format is felt to be most appropriate for this patient at this time.  All issues noted in this document were discussed and addressed.  A limited physical exam was performed with this format.  Please refer to the patient's chart for his consent to telehealth for [facility].   Date:  10/07/2018   ID:  [name], DOB [birth year], MRN [id number]  Patient Location:Home Provider Location: Home  PCP:  Haywood Pao, MD  Cardiologist:  Dr [name]  Evaluation Performed:  Follow-Up Visit  Chief Complaint:  FU CAD, atrial fibrillation and CHF  History of Present Illness:    FU CAD, CHF and atrial fibrillation. He is status post coronary bypassing graft in 1990; also with h/o ischemic cardiomyopathy, prior stroke and prior mural apical thrombus on chronic coumadin Rx. Prior abdominal ultrasound in May 2006 showed no aneurysm. Carotid Dopplers in November of 2005 showed 0-39% bilaterally. MRA in October of 2007 showed no obstructive disease. LHC 09/24/11: LM 30, oLAD 100%, dLAD fills via RV branch off RCA, IM diffuse 95% subtotal disease, mCFX 40%, OM1 normal, RCA normal with large RV branch supplying dLAD, PLA possibly occluded and most dRCA appears to fill from LIMA collats; LIMA patent but does not supply dLAD (connects via small vessel to ? DRCA/PLA, S-IM occluded). Circulation felt to be stable. Continue med Rx recommended.Nuclear study 2018 showed ejection fraction 46%. There was infarct but no ischemia. Patient had a fall in April 2019 and suffered a small subdural hematoma. He was ultimately also  diagnosed with right pelvic fracture treated conservatively. CTA showed occlusion of the right popliteal artery of indeterminate chronicity; followed by vascular surgery. Patient also with hypotension/orthostasis. Patient had successful cardioversion on November 23, 2017. Amiodarone decreased to 100 mg daily October 2019 as patient complained of dizziness, tremors, and constipation.  Follow-up echocardiogram January 2020 showed ejection fraction 35 to 40%, reduced flow at the apex with swirling, mild to moderate mitral regurgitation, biatrial enlargement and moderate to severe tricuspid regurgitation. In atrial flutter at last office visit.  Since last seen,pt has some DOE; no orthopnea, PND, pedal edema, CP or syncope.  The patient does not have symptoms concerning for COVID-19 infection (fever, chills, cough, or new shortness of breath).    Past Medical History:  Diagnosis Date   Asthma    with worsening with beta blockade   Atrial fibrillation (HCC)    CAD (coronary artery disease)    CVA (cerebral infarction) 2012   tia, mild no residual defecits   GI bleed 8 yrs ago   due to doll fully vessel which was clipped   Glaucoma    excellent cataracts   History of kidney stones    Ischemic cardiomyopathy    Moderate to severe mitral regurgitation 06/16/2017   Nephrolithiasis    Post-infarction apical thrombus [facility])    Prostate cancer [facility]) 2012   Radiation proctitis Feb 2015   treated with APC   Tubular adenoma 04/2013   Dr. Hilarie Fredrickson   Past Surgical History:  Procedure Laterality Date   cardiac stents  2003   CARDIOVERSION N/A 06/15/2017   Procedure: CARDIOVERSION;  Surgeon:  Nahser, Wonda Cheng, MD;  Location: [place];  Service: Cardiovascular;  Laterality: N/A;   CARDIOVERSION N/A 07/15/2017   Procedure: CARDIOVERSION;  Surgeon: Jerline Pain, MD;  Location: [facility] ENDOSCOPY;  Service: Cardiovascular;  Laterality: N/A;   CARDIOVERSION N/A 07/20/2017   Procedure:  CARDIOVERSION;  Surgeon: Herminio Commons, MD;  Location: [facility] ENDOSCOPY;  Service: Cardiovascular;  Laterality: N/A;   CARDIOVERSION N/A 11/23/2017   Procedure: CARDIOVERSION;  Surgeon: Lelon Perla, MD;  Location: [place];  Service: Cardiovascular;  Laterality: N/A;   COLONOSCOPY WITH PROPOFOL N/A 05/05/2013   Procedure: COLONOSCOPY WITH PROPOFOL;  Surgeon: Jerene Bears, MD;  Location: [facility] ENDOSCOPY;  Service: Gastroenterology;  Laterality: N/A;   CORONARY ARTERY BYPASS GRAFT  1998   LIMA to the LAD and saphenous vein graft tothe diagonal   EXTRACORPOREAL SHOCK WAVE LITHOTRIPSY Right 04/29/2017   Procedure: RIGHT EXTRACORPOREAL SHOCK WAVE LITHOTRIPSY (ESWL);  Surgeon: Alexis Frock, MD;  Location: [facility] ORS;  Service: Urology;  Laterality: Right;   history of radiation treatment  2012   x 40 treatments done   KNEE ARTHROSCOPY  2016 or 2017   Dr Gladstone Lighter ;    PARTIAL KNEE ARTHROPLASTY Right 01/13/2017   Procedure: UNICOMPARTMENTAL RIGHT KNEE;  Surgeon: Gaynelle Arabian, MD;  Location: [facility] ORS;  Service: Orthopedics;  Laterality: Right;  with block     Current Meds  Medication Sig   alendronate (FOSAMAX) 70 MG tablet Take 70 mg by mouth every Sunday. Take with a full glass of water on an empty stomach.   amiodarone (PACERONE) 200 MG tablet Take 0.5 tablets (100 mg total) by mouth daily.   Cholecalciferol (VITAMIN D) 2000 UNITS tablet Take 2,000 Units by mouth daily.   ELIQUIS 5 MG TABS tablet TAKE 1 TABLET BY MOUTH TWICE DAILY   fluticasone furoate-vilanterol (BREO ELLIPTA) 100-25 MCG/INH AEPB Inhale 1 puff into the lungs daily as needed (shortness of breath).    furosemide (LASIX) 40 MG tablet TAKE 1 AND 1/2 TABLETS(60 MG) BY MOUTH DAILY   latanoprost (XALATAN) 0.005 % ophthalmic solution Place 1 drop into the left eye at bedtime.    levothyroxine (SYNTHROID, LEVOTHROID) 25 MCG tablet Take 1 tablet (25 mcg total) by mouth daily before breakfast.   loratadine (CLARITIN)  10 MG tablet Take 10 mg by mouth daily.   metoprolol succinate (TOPROL XL) 25 MG 24 hr tablet Take 0.5 tablets (12.5 mg total) by mouth daily.   Multiple Vitamin (MULTIVITAMIN WITH MINERALS) TABS tablet Take 1 tablet by mouth daily.   Tamsulosin HCl (FLOMAX) 0.4 MG CAPS Take 0.4 mg by mouth every evening.      Allergies:   Keflex [cephalexin] and Morphine   Social History   Tobacco Use   Smoking status: Never Smoker   Smokeless tobacco: Never Used  Substance Use Topics   Alcohol use: Yes    Alcohol/week: 0.0 standard drinks    Comment: occasional beer or wine   Drug use: No     Family Hx: The patient's family history includes Heart attack in his father; Heart disease in his mother; Stomach cancer in his mother.  ROS:   Please see the history of present illness.    No Fever, chills  or productive cough All other systems reviewed and are negative.   Recent Labs: 04/11/2018: ALT 15; BUN 22; Creatinine, Ser 1.30; Hemoglobin 15.6; Platelets 232; Potassium 4.3; Sodium 142 10/03/2018: TSH 5.910   Recent Lipid Panel Lab Results  Component Value Date/Time   CHOL 60  07/15/2017 11:29 PM   TRIG 51 07/15/2017 11:29 PM   TRIG 44 08/13/2008   HDL 19 (L) 07/15/2017 11:29 PM   CHOLHDL 3.2 07/15/2017 11:29 PM   LDLCALC 31 07/15/2017 11:29 PM    Wt Readings from Last 3 Encounters:  10/07/18 170 lb 3.2 oz (77.2 kg)  04/11/18 172 lb 6.4 oz (78.2 kg)  12/29/17 177 lb (80.3 kg)     Objective:    Vital Signs:  BP 128/79    Pulse 60    Ht 5\' 9"  (1.753 m)    Wt 170 lb 3.2 oz (77.2 kg)    SpO2 96%    BMI 25.13 kg/m    VITAL SIGNS:  reviewed NAD Answers questions appropriately Normal affect Remainder of physical examination not performed (telehealth visit; coronavirus pandemic)  ASSESSMENT & PLAN:    1. Paroxysmal atrial fibrillation-previous plan was rate control and anticoagulation.  We will continue with present dose of amiodarone and Toprol.  I have been hesitant to advance  Toprol as he has a history of orthostasis.  Continue apixaban. Recent TSH minimally elevated but T4 normal. Will have most recent CBC, BMET, LFTs forwarded to [place]; check chest xray. 2. Coronary artery disease-No aspirin given need for apixaban. Resume statin. 3. Chronic systolic/diastolic congestive heart failure-patient appears to be doing reasonably well from a volume standpoint.  Continue Lasix as needed. 4. Ischemic cardiomyopathy-patient has had difficulties with orthostatic symptoms previously and we have therefore not added an ACE inhibitor.  We will continue with low-dose Toprol. 5. History of mural thrombus-plan to continue anticoagulation with apixaban. 6. Peripheral vascular disease-previously noted to have occluded right popliteal artery.  Followed by vascular surgery. 7. Moderate to severe mitral regurgitation-noted to be mild to moderate on follow-up echo. 8. History of mild aortic stenosis-he will need follow-up echoes in the future. 9. Hyperlipidemia-resume pravastatin 40 mg daily.  Check lipids and liver in 8 weeks.  COVID-19 Education: The importance of social distancing was discussed today.  Time:   Today, I have spent 17 minutes with the patient with telehealth technology discussing the above problems.     Medication Adjustments/Labs and Tests Ordered: Current medicines are reviewed at length with the patient today.  Concerns regarding medicines are outlined above.   Tests Ordered: No orders of the defined types were placed in this encounter.   Medication Changes: No orders of the defined types were placed in this encounter.   Follow Up:  Virtual Visit or In Person in 6 month(s)  Signed, Kirk Ruths, MD  10/07/2018 8:54 AM    [place]

## 2018-10-06 ENCOUNTER — Telehealth: Payer: Self-pay | Admitting: Cardiology

## 2018-10-06 NOTE — Telephone Encounter (Signed)
I called pt to confirm his appt for 10-07-18 with Dr [name].

## 2018-10-07 ENCOUNTER — Encounter: Payer: Self-pay | Admitting: Cardiology

## 2018-10-07 ENCOUNTER — Telehealth (INDEPENDENT_AMBULATORY_CARE_PROVIDER_SITE_OTHER): Payer: Medicare Other | Admitting: Cardiology

## 2018-10-07 VITALS — BP 128/79 | HR 60 | Ht 69.0 in | Wt 170.2 lb

## 2018-10-07 DIAGNOSIS — I480 Paroxysmal atrial fibrillation: Secondary | ICD-10-CM

## 2018-10-07 DIAGNOSIS — I5022 Chronic systolic (congestive) heart failure: Secondary | ICD-10-CM | POA: Diagnosis not present

## 2018-10-07 DIAGNOSIS — I70201 Unspecified atherosclerosis of native arteries of extremities, right leg: Secondary | ICD-10-CM | POA: Diagnosis not present

## 2018-10-07 DIAGNOSIS — I2510 Atherosclerotic heart disease of native coronary artery without angina pectoris: Secondary | ICD-10-CM

## 2018-10-07 MED ORDER — PRAVASTATIN SODIUM 40 MG PO TABS: 40.0000 mg | ORAL_TABLET | Freq: Every evening | ORAL | 3 refills | Status: DC

## 2018-10-07 NOTE — Patient Instructions (Signed)
Medication Instructions:  START PRAVASTATIN 40 MG ONCE DAILY If you need a refill on your cardiac medications before your next appointment, please call your pharmacy.   Lab work If you have labs (blood work) drawn today and your tests are completely normal, you will receive your results only by: Marland Kitchen MyChart Message (if you have MyChart) OR . A paper copy in the mail If you have any lab test that is abnormal or we need to change your treatment, we will call you to review the results.  Testing/Procedures: A chest x-ray takes a picture of the organs and structures inside the chest, including the heart, lungs, and blood vessels. This test can show several things, including, whether the heart is enlarges; whether fluid is building up in the lungs; and whether pacemaker / defibrillator leads are still in place. [place]  Follow-Up: At [facility], you and your health needs are our priority.  As part of our continuing mission to provide you with exceptional heart care, we have created designated Provider Care Teams.  These Care Teams include your primary Cardiologist (physician) and Advanced Practice Providers (APPs -  Physician Assistants and Nurse Practitioners) who all work together to provide you with the care you need, when you need it. You will need a follow up appointment in 6 months.  Please call our office 2 months in advance to schedule this appointment.  You may see Kirk Ruths, MD or one of the following Advanced Practice Providers on your designated Care Team:   Kerin Ransom, PA-C Roby Lofts, [place] . Sande Rives, PA-C

## 2018-10-19 ENCOUNTER — Other Ambulatory Visit: Payer: Self-pay

## 2018-10-19 ENCOUNTER — Ambulatory Visit
Admission: RE | Admit: 2018-10-19 | Discharge: 2018-10-19 | Disposition: A | Discharge status: Home / Self Care | Payer: Medicare Other | Source: Ambulatory Visit | Attending: Cardiology | Admitting: Cardiology

## 2018-10-19 DIAGNOSIS — Z79899 Other long term (current) drug therapy: Secondary | ICD-10-CM | POA: Diagnosis not present

## 2018-10-19 DIAGNOSIS — R946 Abnormal results of thyroid function studies: Secondary | ICD-10-CM | POA: Diagnosis not present

## 2018-10-19 DIAGNOSIS — M810 Age-related osteoporosis without current pathological fracture: Secondary | ICD-10-CM | POA: Diagnosis not present

## 2018-10-19 DIAGNOSIS — Z5181 Encounter for therapeutic drug level monitoring: Secondary | ICD-10-CM | POA: Diagnosis not present

## 2018-10-19 DIAGNOSIS — I480 Paroxysmal atrial fibrillation: Secondary | ICD-10-CM

## 2018-10-19 DIAGNOSIS — R7301 Impaired fasting glucose: Secondary | ICD-10-CM | POA: Diagnosis not present

## 2018-10-19 DIAGNOSIS — R82998 Other abnormal findings in urine: Secondary | ICD-10-CM | POA: Diagnosis not present

## 2018-10-19 DIAGNOSIS — Z125 Encounter for screening for malignant neoplasm of prostate: Secondary | ICD-10-CM | POA: Diagnosis not present

## 2018-10-19 DIAGNOSIS — R7989 Other specified abnormal findings of blood chemistry: Secondary | ICD-10-CM | POA: Diagnosis not present

## 2018-10-24 DIAGNOSIS — H52203 Unspecified astigmatism, bilateral: Secondary | ICD-10-CM | POA: Diagnosis not present

## 2018-10-24 DIAGNOSIS — H4720 Unspecified optic atrophy: Secondary | ICD-10-CM | POA: Diagnosis not present

## 2018-10-24 DIAGNOSIS — H353133 Nonexudative age-related macular degeneration, bilateral, advanced atrophic without subfoveal involvement: Secondary | ICD-10-CM | POA: Diagnosis not present

## 2018-10-24 DIAGNOSIS — H4032X3 Glaucoma secondary to eye trauma, left eye, severe stage: Secondary | ICD-10-CM | POA: Diagnosis not present

## 2018-10-26 DIAGNOSIS — I480 Paroxysmal atrial fibrillation: Secondary | ICD-10-CM | POA: Diagnosis not present

## 2018-10-26 DIAGNOSIS — Z1331 Encounter for screening for depression: Secondary | ICD-10-CM | POA: Diagnosis not present

## 2018-10-26 DIAGNOSIS — Z0000 Encounter for general adult medical examination without abnormal findings: Secondary | ICD-10-CM | POA: Diagnosis not present

## 2018-10-26 DIAGNOSIS — E441 Mild protein-calorie malnutrition: Secondary | ICD-10-CM | POA: Diagnosis not present

## 2018-10-26 DIAGNOSIS — R946 Abnormal results of thyroid function studies: Secondary | ICD-10-CM | POA: Diagnosis not present

## 2018-10-26 DIAGNOSIS — S329XXS Fracture of unspecified parts of lumbosacral spine and pelvis, sequela: Secondary | ICD-10-CM | POA: Diagnosis not present

## 2018-10-26 DIAGNOSIS — M5430 Sciatica, unspecified side: Secondary | ICD-10-CM | POA: Diagnosis not present

## 2018-10-26 DIAGNOSIS — Z1339 Encounter for screening examination for other mental health and behavioral disorders: Secondary | ICD-10-CM | POA: Diagnosis not present

## 2018-10-26 DIAGNOSIS — I110 Hypertensive heart disease with heart failure: Secondary | ICD-10-CM | POA: Diagnosis not present

## 2018-10-26 DIAGNOSIS — Z7901 Long term (current) use of anticoagulants: Secondary | ICD-10-CM | POA: Diagnosis not present

## 2018-10-26 DIAGNOSIS — Z8679 Personal history of other diseases of the circulatory system: Secondary | ICD-10-CM | POA: Diagnosis not present

## 2018-10-26 DIAGNOSIS — D692 Other nonthrombocytopenic purpura: Secondary | ICD-10-CM | POA: Diagnosis not present

## 2018-10-26 DIAGNOSIS — M810 Age-related osteoporosis without current pathological fracture: Secondary | ICD-10-CM | POA: Diagnosis not present

## 2018-10-26 DIAGNOSIS — N320 Bladder-neck obstruction: Secondary | ICD-10-CM | POA: Diagnosis not present

## 2018-11-30 DIAGNOSIS — L819 Disorder of pigmentation, unspecified: Secondary | ICD-10-CM | POA: Diagnosis not present

## 2018-11-30 DIAGNOSIS — L570 Actinic keratosis: Secondary | ICD-10-CM | POA: Diagnosis not present

## 2018-11-30 DIAGNOSIS — D485 Neoplasm of uncertain behavior of skin: Secondary | ICD-10-CM | POA: Diagnosis not present

## 2018-11-30 DIAGNOSIS — L821 Other seborrheic keratosis: Secondary | ICD-10-CM | POA: Diagnosis not present

## 2018-12-01 DIAGNOSIS — C44329 Squamous cell carcinoma of skin of other parts of face: Secondary | ICD-10-CM | POA: Diagnosis not present

## 2018-12-07 DIAGNOSIS — C44319 Basal cell carcinoma of skin of other parts of face: Secondary | ICD-10-CM | POA: Diagnosis not present

## 2018-12-07 DIAGNOSIS — C4441 Basal cell carcinoma of skin of scalp and neck: Secondary | ICD-10-CM | POA: Diagnosis not present

## 2018-12-21 DIAGNOSIS — C44329 Squamous cell carcinoma of skin of other parts of face: Secondary | ICD-10-CM | POA: Diagnosis not present

## 2018-12-21 DIAGNOSIS — D0439 Carcinoma in situ of skin of other parts of face: Secondary | ICD-10-CM | POA: Diagnosis not present

## 2018-12-26 ENCOUNTER — Telehealth: Payer: Self-pay | Admitting: Cardiology

## 2018-12-26 NOTE — Telephone Encounter (Signed)
Obtained [facility] statement info from patient.  Called [facility] re denial.  T4/TSH denied for medical necessity.  Provided updated diagnosis of I48.0 A fib.  Claim will be reprocessed.  Notified patiennt.

## 2018-12-26 NOTE — Telephone Encounter (Signed)
° ° °  Patient calling to dispute 10/03/2018 lab work billing. Patient states [employer] told him it was not medically necessary . He has reached out to [employer] already, he was advised to call [facility].

## 2018-12-28 DIAGNOSIS — L570 Actinic keratosis: Secondary | ICD-10-CM | POA: Diagnosis not present

## 2019-01-02 ENCOUNTER — Other Ambulatory Visit: Payer: Self-pay

## 2019-01-02 MED ORDER — APIXABAN 5 MG PO TABS: 5.0000 mg | ORAL_TABLET | Freq: Two times a day (BID) | ORAL | 0 refills | Status: DC

## 2019-01-16 ENCOUNTER — Other Ambulatory Visit: Payer: Self-pay | Admitting: Pharmacist Clinician (PhC)/ Clinical Pharmacy Specialist

## 2019-01-16 MED ORDER — FUROSEMIDE 40 MG PO TABS: ORAL_TABLET | ORAL | 3 refills | Status: DC

## 2019-01-16 NOTE — Telephone Encounter (Signed)
Pt states has reviewed with MD, getting better results with 40 bid than 60 qd.

## 2019-02-16 ENCOUNTER — Other Ambulatory Visit: Payer: Self-pay

## 2019-02-16 DIAGNOSIS — R7989 Other specified abnormal findings of blood chemistry: Secondary | ICD-10-CM

## 2019-02-16 MED ORDER — LEVOTHYROXINE SODIUM 25 MCG PO TABS: 25.0000 ug | ORAL_TABLET | Freq: Every day | ORAL | 2 refills | Status: DC

## 2019-03-15 ENCOUNTER — Telehealth: Payer: Self-pay | Admitting: Cardiology

## 2019-03-15 NOTE — Telephone Encounter (Signed)
Spoke with pt, he was wanting to be put on a list for the vaccine for covid. Patient aware we are not making a list but that is being decided by the state. Patient just wants [place] to put him on the list if we hear anything or if we are asked for recommendations.

## 2019-03-15 NOTE — Telephone Encounter (Signed)
New Message  Patient is calling in with questions about COVID-19 that he would like to speak with Dr. [name] or his nurse about. Please give patient a call back.

## 2019-04-03 DIAGNOSIS — L570 Actinic keratosis: Secondary | ICD-10-CM | POA: Diagnosis not present

## 2019-04-03 DIAGNOSIS — L819 Disorder of pigmentation, unspecified: Secondary | ICD-10-CM | POA: Diagnosis not present

## 2019-04-06 ENCOUNTER — Other Ambulatory Visit: Payer: Self-pay

## 2019-04-06 MED ORDER — APIXABAN 5 MG PO TABS: 5.0000 mg | ORAL_TABLET | Freq: Two times a day (BID) | ORAL | 0 refills | Status: DC

## 2019-04-10 ENCOUNTER — Other Ambulatory Visit: Payer: Self-pay

## 2019-04-10 MED ORDER — APIXABAN 5 MG PO TABS: 5.0000 mg | ORAL_TABLET | Freq: Two times a day (BID) | ORAL | 0 refills | Status: DC

## 2019-04-11 NOTE — Progress Notes (Signed)
HPI: FU CAD, CHF and atrial fibrillation. He is status post coronary bypassing graft in 1990; also with h/o ischemic cardiomyopathy, prior stroke and prior mural apical thrombus on chronic coumadin Rx. Prior abdominal ultrasound in May 2006 showed no aneurysm. Carotid Dopplers in November of 2005 showed 0-39% bilaterally. MRA in October of 2007 showed no obstructive disease. LHC 09/24/11: LM 30, oLAD 100%, dLAD fills via RV branch off RCA, IM diffuse 95% subtotal disease, mCFX 40%, OM1 normal, RCA normal with large RV branch supplying dLAD, PLA possibly occluded and most dRCA appears to fill from LIMA collats; LIMA patent but does not supply dLAD (connects via small vessel to ? DRCA/PLA, S-IM occluded). Circulation felt to be stable. Continue med Rx recommended.Nuclear study 2018 showed ejection fraction 46%. There was infarct but no ischemia. Patient had a fall in April 2019 and suffered a small subdural hematoma. He was ultimately also diagnosed with right pelvic fracture treated conservatively. CTA showed occlusion of the right popliteal artery of indeterminate chronicity; followed by vascular surgery. Patient also with hypotension/orthostasis. Patient had successful cardioversion on November 23, 2017.Amiodarone decreased to 100 mg daily October 2019 as patient complained of dizziness, tremors, and constipation. Follow-up echocardiogram January 2020 showed ejection fraction 35 to 40%, reduced flow at the apex with swirling, mild to moderate mitral regurgitation, biatrial enlargement and moderate to severe tricuspid regurgitation. In atrial flutter at last office visit.  Since last seen,he has some dyspnea on exertion but no orthopnea, PND, pedal edema, chest pain or syncope.  He does have dizziness with standing at times.  Current Outpatient Medications  Medication Sig Dispense Refill  . alendronate (FOSAMAX) 70 MG tablet Take 70 mg by mouth every Sunday. Take with a full glass of water on an  empty stomach.    Marland Kitchen amiodarone (PACERONE) 200 MG tablet Take 0.5 tablets (100 mg total) by mouth daily. 90 tablet 3  . apixaban (ELIQUIS) 5 MG TABS tablet Take 1 tablet (5 mg total) by mouth 2 (two) times daily. 180 tablet 0  . Cholecalciferol (VITAMIN D) 2000 UNITS tablet Take 2,000 Units by mouth daily.    . fluticasone furoate-vilanterol (BREO ELLIPTA) 100-25 MCG/INH AEPB Inhale 1 puff into the lungs daily as needed (shortness of breath).     . furosemide (LASIX) 40 MG tablet Take 1 tablet by mouth twice daily or as directed 180 tablet 3  . latanoprost (XALATAN) 0.005 % ophthalmic solution Place 1 drop into the left eye at bedtime.     Marland Kitchen levothyroxine (SYNTHROID) 25 MCG tablet Take 1 tablet (25 mcg total) by mouth daily before breakfast. 90 tablet 2  . loratadine (CLARITIN) 10 MG tablet Take 10 mg by mouth daily.    . metoprolol succinate (TOPROL XL) 25 MG 24 hr tablet Take 0.5 tablets (12.5 mg total) by mouth daily. 15 tablet 3  . Multiple Vitamin (MULTIVITAMIN WITH MINERALS) TABS tablet Take 1 tablet by mouth daily.    . pravastatin (PRAVACHOL) 40 MG tablet Take 1 tablet (40 mg total) by mouth every evening. 90 tablet 3  . Tamsulosin HCl (FLOMAX) 0.4 MG CAPS Take 0.4 mg by mouth every evening.      No current facility-administered medications for this visit.     Past Medical History:  Diagnosis Date  . Asthma    with worsening with beta blockade  . Atrial fibrillation ([place])   . CAD (coronary artery disease)   . CVA (cerebral infarction) 2012   tia, mild no  residual defecits  . GI bleed 8 yrs ago   due to doll fully vessel which was clipped  . Glaucoma    excellent cataracts  . History of kidney stones   . Ischemic cardiomyopathy   . Moderate to severe mitral regurgitation 06/16/2017  . Nephrolithiasis   . Post-infarction apical thrombus ([place])   . Prostate cancer ([place]) 2012  . Radiation proctitis Feb 2015   treated with APC  . Tubular adenoma 04/2013   Dr. Hilarie Fredrickson    Past  Surgical History:  Procedure Laterality Date  . cardiac stents  2003  . CARDIOVERSION N/A 06/15/2017   Procedure: CARDIOVERSION;  Surgeon: Acie Fredrickson, Wonda Cheng, MD;  Location: [facility];  Service: Cardiovascular;  Laterality: N/A;  . CARDIOVERSION N/A 07/15/2017   Procedure: CARDIOVERSION;  Surgeon: Jerline Pain, MD;  Location: [facility] ENDOSCOPY;  Service: Cardiovascular;  Laterality: N/A;  . CARDIOVERSION N/A 07/20/2017   Procedure: CARDIOVERSION;  Surgeon: Herminio Commons, MD;  Location: [facility] ENDOSCOPY;  Service: Cardiovascular;  Laterality: N/A;  . CARDIOVERSION N/A 11/23/2017   Procedure: CARDIOVERSION;  Surgeon: Lelon Perla, MD;  Location: [place] [place];  Service: Cardiovascular;  Laterality: N/A;  . COLONOSCOPY WITH PROPOFOL N/A 05/05/2013   Procedure: COLONOSCOPY WITH PROPOFOL;  Surgeon: Jerene Bears, MD;  Location: [facility] ENDOSCOPY;  Service: Gastroenterology;  Laterality: N/A;  . CORONARY ARTERY BYPASS GRAFT  1998   LIMA to the LAD and saphenous vein graft tothe diagonal  . EXTRACORPOREAL SHOCK WAVE LITHOTRIPSY Right 04/29/2017   Procedure: RIGHT EXTRACORPOREAL SHOCK WAVE LITHOTRIPSY (ESWL);  Surgeon: Alexis Frock, MD;  Location: [facility];  Service: Urology;  Laterality: Right;  . history of radiation treatment  2012   x 40 treatments done  . KNEE ARTHROSCOPY  2016 or 2017   Dr Gladstone Lighter ;   . PARTIAL KNEE ARTHROPLASTY Right 01/13/2017   Procedure: UNICOMPARTMENTAL RIGHT KNEE;  Surgeon: Gaynelle Arabian, MD;  Location: [facility];  Service: Orthopedics;  Laterality: Right;  with block    Social History   Socioeconomic History  . Marital status: Widowed    Spouse name: Not on file  . Number of children: 3  . Years of education: Not on file  . Highest education level: Not on file  Occupational History  . Occupation: Retired Conservation officer, [employer]  Tobacco Use  . Smoking status: Never Smoker  . Smokeless tobacco: Never Used  Substance and Sexual Activity  . Alcohol use: Yes     Alcohol/week: 0.0 standard drinks    Comment: occasional beer or wine  . Drug use: No  . Sexual activity: Not Currently    Birth control/protection: Abstinence  Other Topics Concern  . Not on file  Social History Narrative  . Not on file   Social Determinants of Health   Financial Resource Strain:   . Difficulty of Paying Living Expenses: Not on file  Food Insecurity:   . Worried About Charity fundraiser in the Last Year: Not on file  . Ran Out of Food in the Last Year: Not on file  Transportation Needs:   . Lack of Transportation (Medical): Not on file  . Lack of Transportation (Non-Medical): Not on file  Physical Activity:   . Days of Exercise per Week: Not on file  . Minutes of Exercise per Session: Not on file  Stress:   . Feeling of Stress : Not on file  Social Connections:   . Frequency of Communication with Friends and Family: Not on file  .  Frequency of Social Gatherings with Friends and Family: Not on file  . Attends Religious Services: Not on file  . Active Member of Clubs or Organizations: Not on file  . Attends Archivist Meetings: Not on file  . Marital Status: Not on file  Intimate Partner Violence:   . Fear of Current or Ex-Partner: Not on file  . Emotionally Abused: Not on file  . Physically Abused: Not on file  . Sexually Abused: Not on file    Family History  Problem Relation Age of Onset  . Heart attack Father   . Heart disease Mother   . Stomach cancer Mother     ROS: no fevers or chills, productive cough, hemoptysis, dysphasia, odynophagia, melena, hematochezia, dysuria, hematuria, rash, seizure activity, orthopnea, PND, pedal edema, claudication. Remaining systems are negative.  Physical Exam: Well-developed well-nourished in no acute distress.  Skin is warm and dry.  HEENT is normal.  Neck is supple.  Chest is clear to auscultation with normal expansion.  Cardiovascular exam is regular rate and rhythm.  Abdominal exam nontender  or distended. No masses palpated. Extremities show no edema. neuro grossly intact  ECG-sinus rhythm at a rate of 61, right bundle branch block.  Personally reviewed  A/P  1 paroxysmal atrial fibrillation plan to continue amiodarone and Toprol for rate control.  I have been hesitant to advance Toprol given history of orthostasis.  Continue apixaban.  Check TSH, liver functions, chest x-ray, CBC, potassium and renal function.  2 coronary artery disease-plan to continue statin.  No aspirin given need for apixaban.  3 chronic systolic/diastolic congestive heart failure-patient doing well from a volume standpoint.  Continue Lasix at present dose.  4 ischemic cardiomyopathy-continue beta-blocker.  I have not added an ACE inhibitor or ARB previously as he had difficulties with orthostasic symptoms which continues.  5 history of mural thrombus-continue apixaban.  6 peripheral vascular disease-patient is followed by vascular surgery.  Continue medical therapy.  7 moderate to severe mitral regurgitation-mild to moderate on most recent echocardiogram.  8 hyperlipidemia-continue statin.  Check lipids.  9 history of mild aortic stenosis-we will plan follow-up echocardiograms in the future.  Kirk Ruths, MD

## 2019-04-12 ENCOUNTER — Ambulatory Visit: Payer: Medicare Other | Attending: Internal Medicine

## 2019-04-12 DIAGNOSIS — Z23 Encounter for immunization: Secondary | ICD-10-CM | POA: Insufficient documentation

## 2019-04-12 NOTE — Progress Notes (Signed)
   Covid-19 Vaccination Clinic  Name:  BERTIE RAO    MRN: [id number] DOB: 1926-05-18  04/12/2019  Mr. Pinkus was observed post Covid-19 immunization for 15 minutes without incidence. He was provided with Vaccine Information Sheet and instruction to access the V-Safe system.   Mr. Kuka was instructed to call 911 with any severe reactions post vaccine: Marland Kitchen Difficulty breathing  . Swelling of your face and throat  . A fast heartbeat  . A bad rash all over your body  . Dizziness and weakness    Immunizations Administered    Name Date Dose VIS Date Route   Pfizer COVID-19 Vaccine 04/12/2019  9:31 AM 0.3 mL 03/10/2019 Intramuscular   Manufacturer: [place]   Lot: [id number]   [place]: [id number]

## 2019-04-18 ENCOUNTER — Other Ambulatory Visit: Payer: Self-pay

## 2019-04-18 ENCOUNTER — Ambulatory Visit (INDEPENDENT_AMBULATORY_CARE_PROVIDER_SITE_OTHER): Payer: Medicare Other | Admitting: Cardiology

## 2019-04-18 ENCOUNTER — Encounter: Payer: Self-pay | Admitting: Cardiology

## 2019-04-18 VITALS — BP 128/76 | HR 61 | Temp 97.6°F | Ht 69.0 in | Wt 178.8 lb

## 2019-04-18 DIAGNOSIS — E7800 Pure hypercholesterolemia, unspecified: Secondary | ICD-10-CM | POA: Diagnosis not present

## 2019-04-18 DIAGNOSIS — I2510 Atherosclerotic heart disease of native coronary artery without angina pectoris: Secondary | ICD-10-CM | POA: Diagnosis not present

## 2019-04-18 DIAGNOSIS — I5022 Chronic systolic (congestive) heart failure: Secondary | ICD-10-CM | POA: Diagnosis not present

## 2019-04-18 DIAGNOSIS — I480 Paroxysmal atrial fibrillation: Secondary | ICD-10-CM | POA: Diagnosis not present

## 2019-04-18 NOTE — Patient Instructions (Signed)
Medication Instructions:  NO CHANGE *If you need a refill on your cardiac medications before your next appointment, please call your pharmacy*  Lab Work: Your physician recommends that you HAVE LAB WORK TODAY  If you have labs (blood work) drawn today and your tests are completely normal, you will receive your results only by: Marland Kitchen MyChart Message (if you have MyChart) OR . A paper copy in the mail If you have any lab test that is abnormal or we need to change your treatment, we will call you to review the results.  Testing/Procedures: A chest x-ray takes a picture of the organs and structures inside the chest, including the heart, lungs, and blood vessels. This test can show several things, including, whether the heart is enlarges; whether fluid is building up in the lungs; and whether pacemaker / defibrillator leads are still in place. [place]  Follow-Up: At [facility], you and your health needs are our priority.  As part of our continuing mission to provide you with exceptional heart care, we have created designated Provider Care Teams.  These Care Teams include your primary Cardiologist (physician) and Advanced Practice Providers (APPs -  Physician Assistants and Nurse Practitioners) who all work together to provide you with the care you need, when you need it.  Your next appointment:   6 month(s)  The format for your next appointment:   Either In Person or Virtual  Provider:   You may see Kirk Ruths, MD or one of the following Advanced Practice Providers on your designated Care Team:    Kerin Ransom, PA-C  [place], [place]  Coletta Memos, [place]

## 2019-04-19 LAB — COMPREHENSIVE METABOLIC PANEL
ALT: 13 IU/L (ref 0–44)
AST: 19 IU/L (ref 0–40)
Albumin/Globulin Ratio: 1.5 (ref 1.2–2.2)
Albumin: 4 g/dL (ref 3.5–4.6)
Alkaline Phosphatase: 59 IU/L (ref 39–117)
BUN/Creatinine Ratio: 21 (ref 10–24)
BUN: 24 mg/dL (ref 10–36)
Bilirubin Total: 0.5 mg/dL (ref 0.0–1.2)
CO2: 28 mmol/L (ref 20–29)
Calcium: 9.4 mg/dL (ref 8.6–10.2)
Chloride: 97 mmol/L (ref 96–106)
Creatinine, Ser: 1.15 mg/dL (ref 0.76–1.27)
GFR calc Af Amer: 64 mL/min/{1.73_m2} (ref >59)
GFR calc non Af Amer: 56 mL/min/{1.73_m2} — ABNORMAL LOW (ref >59)
Globulin, Total: 2.7 g/dL (ref 1.5–4.5)
Glucose: 80 mg/dL (ref 65–99)
Potassium: 4.3 mmol/L (ref 3.5–5.2)
Sodium: 140 mmol/L (ref 134–144)
Total Protein: 6.7 g/dL (ref 6.0–8.5)

## 2019-04-19 LAB — TSH: TSH: 6.46 u[IU]/mL — ABNORMAL HIGH (ref 0.450–4.500)

## 2019-04-19 LAB — LIPID PANEL
Chol/HDL Ratio: 2.7 ratio (ref 0.0–5.0)
Cholesterol, Total: 141 mg/dL (ref 100–199)
HDL: 52 mg/dL (ref >39)
LDL Chol Calc (NIH): 69 mg/dL (ref 0–99)
Triglycerides: 110 mg/dL (ref 0–149)
VLDL Cholesterol Cal: 20 mg/dL (ref 5–40)

## 2019-04-19 LAB — CBC
Hematocrit: 45.9 % (ref 37.5–51.0)
Hemoglobin: 15.8 g/dL (ref 13.0–17.7)
MCH: 33.7 pg — ABNORMAL HIGH (ref 26.6–33.0)
MCHC: 34.4 g/dL (ref 31.5–35.7)
MCV: 98 fL — ABNORMAL HIGH (ref 79–97)
Platelets: 215 10*3/uL (ref 150–450)
RBC: 4.69 x10E6/uL (ref 4.14–5.80)
RDW: 11.8 % (ref 11.6–15.4)
WBC: 5.6 10*3/uL (ref 3.4–10.8)

## 2019-04-27 DIAGNOSIS — H0100A Unspecified blepharitis right eye, upper and lower eyelids: Secondary | ICD-10-CM | POA: Diagnosis not present

## 2019-04-27 DIAGNOSIS — H04123 Dry eye syndrome of bilateral lacrimal glands: Secondary | ICD-10-CM | POA: Diagnosis not present

## 2019-04-27 DIAGNOSIS — Z961 Presence of intraocular lens: Secondary | ICD-10-CM | POA: Diagnosis not present

## 2019-04-27 DIAGNOSIS — H4720 Unspecified optic atrophy: Secondary | ICD-10-CM | POA: Diagnosis not present

## 2019-05-01 DIAGNOSIS — Z9861 Coronary angioplasty status: Secondary | ICD-10-CM | POA: Diagnosis not present

## 2019-05-01 DIAGNOSIS — Z1331 Encounter for screening for depression: Secondary | ICD-10-CM | POA: Diagnosis not present

## 2019-05-01 DIAGNOSIS — I110 Hypertensive heart disease with heart failure: Secondary | ICD-10-CM | POA: Diagnosis not present

## 2019-05-01 DIAGNOSIS — I509 Heart failure, unspecified: Secondary | ICD-10-CM | POA: Diagnosis not present

## 2019-05-01 DIAGNOSIS — Z7901 Long term (current) use of anticoagulants: Secondary | ICD-10-CM | POA: Diagnosis not present

## 2019-05-01 DIAGNOSIS — I480 Paroxysmal atrial fibrillation: Secondary | ICD-10-CM | POA: Diagnosis not present

## 2019-05-01 DIAGNOSIS — J449 Chronic obstructive pulmonary disease, unspecified: Secondary | ICD-10-CM | POA: Diagnosis not present

## 2019-05-01 DIAGNOSIS — I679 Cerebrovascular disease, unspecified: Secondary | ICD-10-CM | POA: Diagnosis not present

## 2019-05-01 DIAGNOSIS — E441 Mild protein-calorie malnutrition: Secondary | ICD-10-CM | POA: Diagnosis not present

## 2019-05-01 DIAGNOSIS — E039 Hypothyroidism, unspecified: Secondary | ICD-10-CM | POA: Diagnosis not present

## 2019-05-01 DIAGNOSIS — N1831 Chronic kidney disease, stage 3a: Secondary | ICD-10-CM | POA: Diagnosis not present

## 2019-05-01 DIAGNOSIS — N320 Bladder-neck obstruction: Secondary | ICD-10-CM | POA: Diagnosis not present

## 2019-05-02 ENCOUNTER — Ambulatory Visit: Payer: Medicare Other | Attending: Internal Medicine

## 2019-05-02 DIAGNOSIS — Z23 Encounter for immunization: Secondary | ICD-10-CM | POA: Insufficient documentation

## 2019-05-02 NOTE — Progress Notes (Signed)
   Covid-19 Vaccination Clinic  Name:  Donald [name]    MRN: [id number] DOB: 14-Jan-1927  05/02/2019  [name] was observed post Covid-19 immunization for 15 minutes without incidence. He was provided with Vaccine Information Sheet and instruction to access the V-Safe system.   [name] was instructed to call 911 with any severe reactions post vaccine: Marland Kitchen Difficulty breathing  . Swelling of your face and throat  . A fast heartbeat  . A bad rash all over your body  . Dizziness and weakness    Immunizations Administered    Name Date Dose VIS Date Route   Pfizer COVID-19 Vaccine 05/02/2019  8:27 AM 0.3 mL 03/10/2019 Intramuscular   Manufacturer: [place]   Lot: [id number]   [place]: [id number]

## 2019-06-21 ENCOUNTER — Telehealth: Payer: Self-pay | Admitting: Cardiology

## 2019-06-21 DIAGNOSIS — I5022 Chronic systolic (congestive) heart failure: Secondary | ICD-10-CM

## 2019-06-21 DIAGNOSIS — I480 Paroxysmal atrial fibrillation: Secondary | ICD-10-CM

## 2019-06-21 MED ORDER — FUROSEMIDE 40 MG PO TABS: 80.0000 mg | ORAL_TABLET | Freq: Two times a day (BID) | ORAL | 3 refills | Status: DC

## 2019-06-21 MED ORDER — METOPROLOL SUCCINATE ER 25 MG PO TB24: 12.5000 mg | ORAL_TABLET | Freq: Every day | ORAL | 3 refills | Status: DC

## 2019-06-21 NOTE — Telephone Encounter (Signed)
Spoke with pt who was requesting new Rx for Metoprolol 12.5 mg BID and Lasix 80 mg BID. Informed pt that per chart review, Lasix dose was prescribed for 40 mg daily. Pt state MD was ok with him increasing dose to 80 mg in the morning and 40 at night. He report that did work so he has self increased to 80 mg BID about 4 weeks ago and weight has remained stable.  Nurse will send Rx for metoprolol but will request approval for increased dose.

## 2019-06-21 NOTE — Telephone Encounter (Signed)
Okay for Lasix at present dose and metoprolol.  If he has increased to 80 mg twice daily would check potassium and renal function. Kirk Ruths

## 2019-06-21 NOTE — Telephone Encounter (Signed)
  Pt c/o medication issue:  1. Name of Medication: metoprolol succinate (TOPROL XL) 25 MG 24 hr tablet  2. How are you currently taking this medication (dosage and times per day)? Half a tablet once a day  3. Are you having a reaction (difficulty breathing--STAT)? no  4. What is your medication issue? Patient would like to know if the medication comes in 12.5mg  tablets so he doesn't have to break them in half.

## 2019-06-21 NOTE — Telephone Encounter (Signed)
Spoke with pt, Aware of dr crenshaw's recommendations. New script sent to the pharmacy and Lab orders mailed to the pt  

## 2019-06-26 ENCOUNTER — Other Ambulatory Visit: Payer: Self-pay

## 2019-06-26 DIAGNOSIS — I480 Paroxysmal atrial fibrillation: Secondary | ICD-10-CM

## 2019-06-26 MED ORDER — METOPROLOL SUCCINATE ER 25 MG PO TB24: 12.5000 mg | ORAL_TABLET | Freq: Every day | ORAL | 3 refills | Status: DC

## 2019-06-29 DIAGNOSIS — I5022 Chronic systolic (congestive) heart failure: Secondary | ICD-10-CM | POA: Diagnosis not present

## 2019-06-30 LAB — BASIC METABOLIC PANEL
BUN/Creatinine Ratio: 21 (ref 10–24)
BUN: 26 mg/dL (ref 10–36)
CO2: 29 mmol/L (ref 20–29)
Calcium: 9.4 mg/dL (ref 8.6–10.2)
Chloride: 100 mmol/L (ref 96–106)
Creatinine, Ser: 1.26 mg/dL (ref 0.76–1.27)
GFR calc Af Amer: 58 mL/min/{1.73_m2} — ABNORMAL LOW (ref >59)
GFR calc non Af Amer: 50 mL/min/{1.73_m2} — ABNORMAL LOW (ref >59)
Glucose: 120 mg/dL — ABNORMAL HIGH (ref 65–99)
Potassium: 3.9 mmol/L (ref 3.5–5.2)
Sodium: 144 mmol/L (ref 134–144)

## 2019-07-05 ENCOUNTER — Other Ambulatory Visit: Payer: Self-pay

## 2019-07-05 MED ORDER — AMIODARONE HCL 200 MG PO TABS: 100.0000 mg | ORAL_TABLET | Freq: Every day | ORAL | 3 refills | Status: DC

## 2019-07-10 DIAGNOSIS — N200 Calculus of kidney: Secondary | ICD-10-CM | POA: Diagnosis not present

## 2019-07-10 DIAGNOSIS — C61 Malignant neoplasm of prostate: Secondary | ICD-10-CM | POA: Diagnosis not present

## 2019-07-10 DIAGNOSIS — R351 Nocturia: Secondary | ICD-10-CM | POA: Diagnosis not present

## 2019-07-10 DIAGNOSIS — N401 Enlarged prostate with lower urinary tract symptoms: Secondary | ICD-10-CM | POA: Diagnosis not present

## 2019-08-15 ENCOUNTER — Telehealth: Payer: Self-pay | Admitting: Cardiology

## 2019-08-15 NOTE — Telephone Encounter (Signed)
*  STAT* If patient is at the pharmacy, call can be transferred to refill team.   1. Which medications need to be refilled? (please list name of each medication and dose if known)   furosemide (LASIX) 40 MG tablet   2. Which pharmacy/location (including street and city if local pharmacy) is medication to be sent to? [facility] [place], [place]  3. Do they need a 30 day or 90 day supply? 90 day supply

## 2019-08-16 ENCOUNTER — Other Ambulatory Visit: Payer: Self-pay

## 2019-08-16 MED ORDER — FUROSEMIDE 40 MG PO TABS: 80.0000 mg | ORAL_TABLET | Freq: Two times a day (BID) | ORAL | 3 refills | Status: DC

## 2019-10-23 ENCOUNTER — Telehealth: Payer: Self-pay | Admitting: Cardiology

## 2019-10-23 MED ORDER — APIXABAN 5 MG PO TABS: 5.0000 mg | ORAL_TABLET | Freq: Two times a day (BID) | ORAL | 0 refills | Status: DC

## 2019-10-23 NOTE — Telephone Encounter (Signed)
*  STAT* If patient is at the pharmacy, call can be transferred to refill team.   1. Which medications need to be refilled? (please list name of each medication and dose if known) apixaban (ELIQUIS) 5 MG TABS tablet  2. Which pharmacy/location (including street and city if local pharmacy) is medication to be sent to? [id number] - [place], [place] - [address]  3. Do they need a 30 day or 90 day supply? 90 day  

## 2019-10-25 DIAGNOSIS — Z125 Encounter for screening for malignant neoplasm of prostate: Secondary | ICD-10-CM | POA: Diagnosis not present

## 2019-10-25 DIAGNOSIS — R7301 Impaired fasting glucose: Secondary | ICD-10-CM | POA: Diagnosis not present

## 2019-10-25 DIAGNOSIS — M810 Age-related osteoporosis without current pathological fracture: Secondary | ICD-10-CM | POA: Diagnosis not present

## 2019-10-25 DIAGNOSIS — I110 Hypertensive heart disease with heart failure: Secondary | ICD-10-CM | POA: Diagnosis not present

## 2019-10-25 DIAGNOSIS — E038 Other specified hypothyroidism: Secondary | ICD-10-CM | POA: Diagnosis not present

## 2019-11-02 DIAGNOSIS — Z0000 Encounter for general adult medical examination without abnormal findings: Secondary | ICD-10-CM | POA: Diagnosis not present

## 2019-11-02 DIAGNOSIS — Z7901 Long term (current) use of anticoagulants: Secondary | ICD-10-CM | POA: Diagnosis not present

## 2019-11-02 DIAGNOSIS — N1831 Chronic kidney disease, stage 3a: Secondary | ICD-10-CM | POA: Diagnosis not present

## 2019-11-02 DIAGNOSIS — J449 Chronic obstructive pulmonary disease, unspecified: Secondary | ICD-10-CM | POA: Diagnosis not present

## 2019-11-02 DIAGNOSIS — Z9861 Coronary angioplasty status: Secondary | ICD-10-CM | POA: Diagnosis not present

## 2019-11-02 DIAGNOSIS — R82998 Other abnormal findings in urine: Secondary | ICD-10-CM | POA: Diagnosis not present

## 2019-11-02 DIAGNOSIS — I480 Paroxysmal atrial fibrillation: Secondary | ICD-10-CM | POA: Diagnosis not present

## 2019-11-02 DIAGNOSIS — I110 Hypertensive heart disease with heart failure: Secondary | ICD-10-CM | POA: Diagnosis not present

## 2019-11-02 DIAGNOSIS — D692 Other nonthrombocytopenic purpura: Secondary | ICD-10-CM | POA: Diagnosis not present

## 2019-11-02 DIAGNOSIS — I509 Heart failure, unspecified: Secondary | ICD-10-CM | POA: Diagnosis not present

## 2019-11-02 DIAGNOSIS — R7301 Impaired fasting glucose: Secondary | ICD-10-CM | POA: Diagnosis not present

## 2019-11-02 DIAGNOSIS — I679 Cerebrovascular disease, unspecified: Secondary | ICD-10-CM | POA: Diagnosis not present

## 2019-11-02 DIAGNOSIS — E441 Mild protein-calorie malnutrition: Secondary | ICD-10-CM | POA: Diagnosis not present

## 2019-11-14 DIAGNOSIS — L304 Erythema intertrigo: Secondary | ICD-10-CM | POA: Diagnosis not present

## 2019-11-14 DIAGNOSIS — L570 Actinic keratosis: Secondary | ICD-10-CM | POA: Diagnosis not present

## 2019-11-14 DIAGNOSIS — C44329 Squamous cell carcinoma of skin of other parts of face: Secondary | ICD-10-CM | POA: Diagnosis not present

## 2020-01-15 ENCOUNTER — Other Ambulatory Visit: Payer: Self-pay | Admitting: Cardiology

## 2020-01-23 NOTE — Progress Notes (Signed)
HPI: FU CAD, CHF and atrial fibrillation. He is status post coronary bypassing graft in 1990; also with h/o ischemic cardiomyopathy, prior stroke and prior mural apical thrombus on chronic coumadin Rx. Prior abdominal ultrasound in May 2006 showed no aneurysm. Carotid Dopplers in November of 2005 showed 0-39% bilaterally. MRA in October of 2007 showed no obstructive disease. LHC 09/24/11: LM 30, oLAD 100%, dLAD fills via RV branch off RCA, IM diffuse 95% subtotal disease, mCFX 40%, OM1 normal, RCA normal with large RV branch supplying dLAD, PLA possibly occluded and most dRCA appears to fill from LIMA collats; LIMA patent but does not supply dLAD (connects via small vessel to ? DRCA/PLA, S-IM occluded). Circulation felt to be stable. Continue med Rx recommended. Nuclear study 2018 showed ejection fraction 46%. There was infarct but no ischemia. Patient had a fall in April 2019 and suffered a small subdural hematoma. He was ultimately also diagnosed with right pelvic fracture treated conservatively. CTA showed occlusion of the right popliteal artery of indeterminate chronicity; followed by vascular surgery. Patient also with hypotension/orthostasis. Patient had successful cardioversion on November 23, 2017.Amiodarone decreased to 100 mg daily October 2019 as patient complained of dizziness, tremors, and constipation. Follow-up echocardiogram January 2020 showed ejection fraction 35 to 40%, reduced flow at the apex with swirling, mild to moderate mitral regurgitation, biatrial enlargement and moderate to severe tricuspid regurgitation.In atrial flutter at last office visit.Since last seen,he denies chest pain, palpitations, syncope or pedal edema.  He has dyspnea with more vigorous activities but not routine activities.  No orthopnea, PND.  Current Outpatient Medications  Medication Sig Dispense Refill  . alendronate (FOSAMAX) 70 MG tablet Take 70 mg by mouth every Sunday. Take with a full glass  of water on an empty stomach.    Marland Kitchen amiodarone (PACERONE) 200 MG tablet Take 0.5 tablets (100 mg total) by mouth daily. 90 tablet 3  . Cholecalciferol (VITAMIN D) 2000 UNITS tablet Take 2,000 Units by mouth daily.    Marland Kitchen ELIQUIS 5 MG TABS tablet TAKE 1 TABLET BY MOUTH TWO TIMES DAILY 180 tablet 1  . fluticasone furoate-vilanterol (BREO ELLIPTA) 100-25 MCG/INH AEPB Inhale 1 puff into the lungs daily as needed (shortness of breath).     . furosemide (LASIX) 40 MG tablet Take 2 tablets (80 mg total) by mouth 2 (two) times daily. 360 tablet 3  . latanoprost (XALATAN) 0.005 % ophthalmic solution Place 1 drop into the left eye at bedtime.     Marland Kitchen levothyroxine (SYNTHROID) 25 MCG tablet Take 1 tablet (25 mcg total) by mouth daily before breakfast. 90 tablet 2  . loratadine (CLARITIN) 10 MG tablet Take 10 mg by mouth daily.    . metoprolol succinate (TOPROL XL) 25 MG 24 hr tablet Take 0.5 tablets (12.5 mg total) by mouth daily. 45 tablet 3  . Multiple Vitamin (MULTIVITAMIN WITH MINERALS) TABS tablet Take 1 tablet by mouth daily.    . pravastatin (PRAVACHOL) 40 MG tablet Take 1 tablet (40 mg total) by mouth every evening. 90 tablet 3  . Tamsulosin HCl (FLOMAX) 0.4 MG CAPS Take 0.4 mg by mouth every evening.      No current facility-administered medications for this visit.     Past Medical History:  Diagnosis Date  . Asthma    with worsening with beta blockade  . Atrial fibrillation ([place])   . CAD (coronary artery disease)   . CVA (cerebral infarction) 2012   tia, mild no residual defecits  . GI  bleed 8 yrs ago   due to doll fully vessel which was clipped  . Glaucoma    excellent cataracts  . History of kidney stones   . Ischemic cardiomyopathy   . Moderate to severe mitral regurgitation 06/16/2017  . Nephrolithiasis   . Post-infarction apical thrombus ([place])   . Prostate cancer ([place]) 2012  . Radiation proctitis Feb 2015   treated with APC  . Tubular adenoma 04/2013   Dr. Hilarie Fredrickson    Past  Surgical History:  Procedure Laterality Date  . cardiac stents  2003  . CARDIOVERSION N/A 06/15/2017   Procedure: CARDIOVERSION;  Surgeon: Acie Fredrickson, Wonda Cheng, MD;  Location: [facility];  Service: Cardiovascular;  Laterality: N/A;  . CARDIOVERSION N/A 07/15/2017   Procedure: CARDIOVERSION;  Surgeon: Jerline Pain, MD;  Location: [facility] ENDOSCOPY;  Service: Cardiovascular;  Laterality: N/A;  . CARDIOVERSION N/A 07/20/2017   Procedure: CARDIOVERSION;  Surgeon: Herminio Commons, MD;  Location: [facility] ENDOSCOPY;  Service: Cardiovascular;  Laterality: N/A;  . CARDIOVERSION N/A 11/23/2017   Procedure: CARDIOVERSION;  Surgeon: Lelon Perla, MD;  Location: [place];  Service: Cardiovascular;  Laterality: N/A;  . COLONOSCOPY WITH PROPOFOL N/A 05/05/2013   Procedure: COLONOSCOPY WITH PROPOFOL;  Surgeon: Jerene Bears, MD;  [facility];  Service: Gastroenterology;  Laterality: N/A;  . CORONARY ARTERY BYPASS GRAFT  1998   LIMA to the LAD and saphenous vein graft tothe diagonal  . EXTRACORPOREAL SHOCK WAVE LITHOTRIPSY Right 04/29/2017   Procedure: RIGHT EXTRACORPOREAL SHOCK WAVE LITHOTRIPSY (ESWL);  Surgeon: Alexis Frock, MD;  Location: [facility];  Service: Urology;  Laterality: Right;  . history of radiation treatment  2012   x 40 treatments done  . KNEE ARTHROSCOPY  2016 or 2017   Dr Gladstone Lighter ;   . PARTIAL KNEE ARTHROPLASTY Right 01/13/2017   Procedure: UNICOMPARTMENTAL RIGHT KNEE;  Surgeon: Gaynelle Arabian, MD;  Location: [facility];  Service: Orthopedics;  Laterality: Right;  with block    Social History   Socioeconomic History  . Marital status: Widowed    Spouse name: Not on file  . Number of children: 3  . Years of education: Not on file  . Highest education level: Not on file  Occupational History  . Occupation: Retired Conservation officer, [employer]  Tobacco Use  . Smoking status: Never Smoker  . Smokeless tobacco: Never Used  Vaping Use  . Vaping Use: Never used  Substance and Sexual  Activity  . Alcohol use: Yes    Alcohol/week: 0.0 standard drinks    Comment: occasional beer or wine  . Drug use: No  . Sexual activity: Not Currently    Birth control/protection: Abstinence  Other Topics Concern  . Not on file  Social History Narrative  . Not on file   Social Determinants of Health   Financial Resource Strain:   . Difficulty of Paying Living Expenses: Not on file  Food Insecurity:   . Worried About Charity fundraiser in the Last Year: Not on file  . Ran Out of Food in the Last Year: Not on file  Transportation Needs:   . Lack of Transportation (Medical): Not on file  . Lack of Transportation (Non-Medical): Not on file  Physical Activity:   . Days of Exercise per Week: Not on file  . Minutes of Exercise per Session: Not on file  Stress:   . Feeling of Stress : Not on file  Social Connections:   . Frequency of Communication with Friends and Family: Not  on file  . Frequency of Social Gatherings with Friends and Family: Not on file  . Attends Religious Services: Not on file  . Active Member of Clubs or Organizations: Not on file  . Attends Archivist Meetings: Not on file  . Marital Status: Not on file  Intimate Partner Violence:   . Fear of Current or Ex-Partner: Not on file  . Emotionally Abused: Not on file  . Physically Abused: Not on file  . Sexually Abused: Not on file    Family History  Problem Relation Age of Onset  . Heart attack Father   . Heart disease Mother   . Stomach cancer Mother     ROS: no fevers or chills, productive cough, hemoptysis, dysphasia, odynophagia, melena, hematochezia, dysuria, hematuria, rash, seizure activity, orthopnea, PND, pedal edema, claudication. Remaining systems are negative.  Physical Exam: Well-developed well-nourished in no acute distress.  Skin is warm and dry.  HEENT is normal.  Neck is supple.  Chest is clear to auscultation with normal expansion.  Cardiovascular exam is regular rate and  rhythm.  Abdominal exam nontender or distended. No masses palpated. Extremities show no edema. neuro grossly intact  ECG-sinus rhythm at a rate of 65, right bundle branch block.  Inferior infarct.  Personally reviewed  A/P  1 paroxysmal atrial fibrillation-patient remains in sinus rhythm today.  Continue amiodarone and Toprol.  Continue apixaban.  Check TSH, liver functions, CBC, bmet and chest x-ray.  2 coronary artery disease-no chest pain.  Continue statin.  He is not on aspirin given need for apixaban.  3 chronic combined systolic/diastolic congestive heart failure-he appears to be euvolemic.  Continue Lasix.  Check potassium and renal function.  4 ischemic cardiomyopathy-continue beta-blocker.  No ACE inhibitor or ARB given history of orthostasis.  5 history of mural apical thrombus-continue apixaban.  Check hemoglobin and renal function.  6 hyperlipidemia-continue statin.  7 history of valvular heart disease-we will plan to repeat echocardiogram in the future.  8 peripheral vascular disease-followed by vascular surgery.  Kirk Ruths, MD

## 2020-01-29 ENCOUNTER — Ambulatory Visit (INDEPENDENT_AMBULATORY_CARE_PROVIDER_SITE_OTHER): Payer: Medicare Other | Admitting: Cardiology

## 2020-01-29 ENCOUNTER — Other Ambulatory Visit: Payer: Self-pay

## 2020-01-29 ENCOUNTER — Encounter: Payer: Self-pay | Admitting: Cardiology

## 2020-01-29 VITALS — BP 128/62 | HR 65 | Ht 70.0 in | Wt 178.2 lb

## 2020-01-29 DIAGNOSIS — I255 Ischemic cardiomyopathy: Secondary | ICD-10-CM

## 2020-01-29 DIAGNOSIS — I2510 Atherosclerotic heart disease of native coronary artery without angina pectoris: Secondary | ICD-10-CM | POA: Diagnosis not present

## 2020-01-29 DIAGNOSIS — E7800 Pure hypercholesterolemia, unspecified: Secondary | ICD-10-CM | POA: Diagnosis not present

## 2020-01-29 DIAGNOSIS — I480 Paroxysmal atrial fibrillation: Secondary | ICD-10-CM

## 2020-01-29 NOTE — Patient Instructions (Signed)
  Lab Work:  Your physician recommends that you HAVE LAB WORK TODAY  If you have labs (blood work) drawn today and your tests are completely normal, you will receive your results only by: Marland Kitchen MyChart Message (if you have MyChart) OR . A paper copy in the mail If you have any lab test that is abnormal or we need to change your treatment, we will call you to review the results.   Testing/Procedures:  A chest x-ray takes a picture of the organs and structures inside the chest, including the heart, lungs, and blood vessels. This test can show several things, including, whether the heart is enlarges; whether fluid is building up in the lungs; and whether pacemaker / defibrillator leads are still in place. [address]    Follow-Up: At [facility], you and your health needs are our priority.  As part of our continuing mission to provide you with exceptional heart care, we have created designated Provider Care Teams.  These Care Teams include your primary Cardiologist (physician) and Advanced Practice Providers (APPs -  Physician Assistants and Nurse Practitioners) who all work together to provide you with the care you need, when you need it.  We recommend signing up for the patient portal called "MyChart".  Sign up information is provided on this After Visit Summary.  MyChart is used to connect with patients for Virtual Visits (Telemedicine).  Patients are able to view lab/test results, encounter notes, upcoming appointments, etc.  Non-urgent messages can be sent to your provider as well.   To learn more about what you can do with MyChart, go to [email or web address].    Your next appointment:   6 month(s)  The format for your next appointment:   In Person  Provider:   Kirk Ruths, MD

## 2020-01-30 LAB — CBC
Hematocrit: 45 % (ref 37.5–51.0)
Hemoglobin: 14.8 g/dL (ref 13.0–17.7)
MCH: 32.5 pg (ref 26.6–33.0)
MCHC: 32.9 g/dL (ref 31.5–35.7)
MCV: 99 fL — ABNORMAL HIGH (ref 79–97)
Platelets: 236 10*3/uL (ref 150–450)
RBC: 4.55 x10E6/uL (ref 4.14–5.80)
RDW: 12 % (ref 11.6–15.4)
WBC: 5.8 10*3/uL (ref 3.4–10.8)

## 2020-01-30 LAB — BASIC METABOLIC PANEL
BUN/Creatinine Ratio: 21 (ref 10–24)
BUN: 23 mg/dL (ref 10–36)
CO2: 27 mmol/L (ref 20–29)
Calcium: 9.2 mg/dL (ref 8.6–10.2)
Chloride: 98 mmol/L (ref 96–106)
Creatinine, Ser: 1.09 mg/dL (ref 0.76–1.27)
GFR calc Af Amer: 68 mL/min/{1.73_m2} (ref >59)
GFR calc non Af Amer: 59 mL/min/{1.73_m2} — ABNORMAL LOW (ref >59)
Glucose: 100 mg/dL — ABNORMAL HIGH (ref 65–99)
Potassium: 3.8 mmol/L (ref 3.5–5.2)
Sodium: 138 mmol/L (ref 134–144)

## 2020-01-30 LAB — HEPATIC FUNCTION PANEL
ALT: 15 IU/L (ref 0–44)
AST: 21 IU/L (ref 0–40)
Albumin: 4.2 g/dL (ref 3.5–4.6)
Alkaline Phosphatase: 59 IU/L (ref 44–121)
Bilirubin Total: 0.4 mg/dL (ref 0.0–1.2)
Bilirubin, Direct: 0.15 mg/dL (ref 0.00–0.40)
Total Protein: 6.5 g/dL (ref 6.0–8.5)

## 2020-01-30 LAB — TSH: TSH: 3.98 u[IU]/mL (ref 0.450–4.500)

## 2020-01-31 ENCOUNTER — Ambulatory Visit
Admission: RE | Admit: 2020-01-31 | Discharge: 2020-01-31 | Disposition: A | Discharge status: Home / Self Care | Payer: Medicare Other | Source: Ambulatory Visit | Attending: Cardiology | Admitting: Cardiology

## 2020-01-31 ENCOUNTER — Other Ambulatory Visit: Payer: Self-pay

## 2020-01-31 DIAGNOSIS — Z5181 Encounter for therapeutic drug level monitoring: Secondary | ICD-10-CM | POA: Diagnosis not present

## 2020-01-31 DIAGNOSIS — I480 Paroxysmal atrial fibrillation: Secondary | ICD-10-CM

## 2020-02-07 ENCOUNTER — Encounter: Payer: Self-pay | Admitting: *Deleted

## 2020-02-09 NOTE — Addendum Note (Signed)
Addended by: Wonda Horner on: 02/09/2020 02:04 PM   Modules accepted: Orders

## 2020-03-06 DIAGNOSIS — R0781 Pleurodynia: Secondary | ICD-10-CM | POA: Diagnosis not present

## 2020-03-06 DIAGNOSIS — Z96651 Presence of right artificial knee joint: Secondary | ICD-10-CM | POA: Diagnosis not present

## 2020-04-03 ENCOUNTER — Other Ambulatory Visit: Payer: Self-pay | Admitting: Pharmacist Clinician (PhC)/ Clinical Pharmacy Specialist

## 2020-04-03 MED ORDER — APIXABAN 5 MG PO TABS: 5.0000 mg | ORAL_TABLET | Freq: Two times a day (BID) | ORAL | 1 refills | Status: DC

## 2020-04-04 DIAGNOSIS — L570 Actinic keratosis: Secondary | ICD-10-CM | POA: Diagnosis not present

## 2020-04-04 DIAGNOSIS — L218 Other seborrheic dermatitis: Secondary | ICD-10-CM | POA: Diagnosis not present

## 2020-04-04 DIAGNOSIS — L821 Other seborrheic keratosis: Secondary | ICD-10-CM | POA: Diagnosis not present

## 2020-04-05 ENCOUNTER — Other Ambulatory Visit: Payer: Self-pay | Admitting: Pharmacist

## 2020-04-05 DIAGNOSIS — R7989 Other specified abnormal findings of blood chemistry: Secondary | ICD-10-CM

## 2020-04-05 DIAGNOSIS — I2510 Atherosclerotic heart disease of native coronary artery without angina pectoris: Secondary | ICD-10-CM

## 2020-04-05 DIAGNOSIS — I480 Paroxysmal atrial fibrillation: Secondary | ICD-10-CM

## 2020-04-05 MED ORDER — LEVOTHYROXINE SODIUM 25 MCG PO TABS: 25.0000 ug | ORAL_TABLET | Freq: Every day | ORAL | 2 refills | Status: DC

## 2020-04-05 MED ORDER — METOPROLOL SUCCINATE ER 25 MG PO TB24: 12.5000 mg | ORAL_TABLET | Freq: Every day | ORAL | 3 refills | Status: DC

## 2020-04-05 MED ORDER — FUROSEMIDE 40 MG PO TABS: 80.0000 mg | ORAL_TABLET | Freq: Two times a day (BID) | ORAL | 3 refills | Status: DC

## 2020-04-05 MED ORDER — AMIODARONE HCL 200 MG PO TABS: 100.0000 mg | ORAL_TABLET | Freq: Every day | ORAL | 3 refills | Status: DC

## 2020-04-05 MED ORDER — PRAVASTATIN SODIUM 40 MG PO TABS: 40.0000 mg | ORAL_TABLET | Freq: Every evening | ORAL | 3 refills | Status: DC

## 2020-04-11 ENCOUNTER — Telehealth: Payer: Self-pay | Admitting: Cardiology

## 2020-04-11 DIAGNOSIS — I480 Paroxysmal atrial fibrillation: Secondary | ICD-10-CM

## 2020-04-11 NOTE — Telephone Encounter (Signed)
*  STAT* If patient is at the pharmacy, call can be transferred to refill team.   1. Which medications need to be refilled? (please list name of each medication and dose if known)  Changing pharmacy- need new prescription for metoprolol- please call today if possible please- pt is out of it  2. Which pharmacy/location (including street and city if local pharmacy) is medication to be sent to? [facility]  3. Do they need a 30 day or 90 day supply? 90 days and refills

## 2020-04-12 MED ORDER — METOPROLOL SUCCINATE ER 25 MG PO TB24: 12.5000 mg | ORAL_TABLET | Freq: Every day | ORAL | 8 refills | Status: DC

## 2020-05-01 DIAGNOSIS — H353132 Nonexudative age-related macular degeneration, bilateral, intermediate dry stage: Secondary | ICD-10-CM | POA: Diagnosis not present

## 2020-05-01 DIAGNOSIS — H4720 Unspecified optic atrophy: Secondary | ICD-10-CM | POA: Diagnosis not present

## 2020-05-01 DIAGNOSIS — H52203 Unspecified astigmatism, bilateral: Secondary | ICD-10-CM | POA: Diagnosis not present

## 2020-05-01 DIAGNOSIS — H04123 Dry eye syndrome of bilateral lacrimal glands: Secondary | ICD-10-CM | POA: Diagnosis not present

## 2020-05-06 DIAGNOSIS — E039 Hypothyroidism, unspecified: Secondary | ICD-10-CM | POA: Diagnosis not present

## 2020-05-06 DIAGNOSIS — N1831 Chronic kidney disease, stage 3a: Secondary | ICD-10-CM | POA: Diagnosis not present

## 2020-05-06 DIAGNOSIS — I110 Hypertensive heart disease with heart failure: Secondary | ICD-10-CM | POA: Diagnosis not present

## 2020-05-06 DIAGNOSIS — J449 Chronic obstructive pulmonary disease, unspecified: Secondary | ICD-10-CM | POA: Diagnosis not present

## 2020-05-06 DIAGNOSIS — I480 Paroxysmal atrial fibrillation: Secondary | ICD-10-CM | POA: Diagnosis not present

## 2020-05-06 DIAGNOSIS — Z9861 Coronary angioplasty status: Secondary | ICD-10-CM | POA: Diagnosis not present

## 2020-05-06 DIAGNOSIS — R7301 Impaired fasting glucose: Secondary | ICD-10-CM | POA: Diagnosis not present

## 2020-05-06 DIAGNOSIS — M5430 Sciatica, unspecified side: Secondary | ICD-10-CM | POA: Diagnosis not present

## 2020-05-06 DIAGNOSIS — Z7901 Long term (current) use of anticoagulants: Secondary | ICD-10-CM | POA: Diagnosis not present

## 2020-05-06 DIAGNOSIS — I679 Cerebrovascular disease, unspecified: Secondary | ICD-10-CM | POA: Diagnosis not present

## 2020-05-06 DIAGNOSIS — D692 Other nonthrombocytopenic purpura: Secondary | ICD-10-CM | POA: Diagnosis not present

## 2020-05-06 DIAGNOSIS — I509 Heart failure, unspecified: Secondary | ICD-10-CM | POA: Diagnosis not present

## 2020-07-15 DIAGNOSIS — Z8546 Personal history of malignant neoplasm of prostate: Secondary | ICD-10-CM | POA: Diagnosis not present

## 2020-07-15 DIAGNOSIS — N401 Enlarged prostate with lower urinary tract symptoms: Secondary | ICD-10-CM | POA: Diagnosis not present

## 2020-07-15 DIAGNOSIS — N200 Calculus of kidney: Secondary | ICD-10-CM | POA: Diagnosis not present

## 2020-07-15 DIAGNOSIS — R351 Nocturia: Secondary | ICD-10-CM | POA: Diagnosis not present

## 2020-07-22 NOTE — Progress Notes (Signed)
HPI: FU CAD, CHF and atrial fibrillation. He is status post coronary bypassing graft in 1990; also with h/o ischemic cardiomyopathy, prior stroke and prior mural apical thrombus on chronic coumadin Rx. Prior abdominal ultrasound in May 2006 showed no aneurysm. Carotid Dopplers in November of 2005 showed 0-39% bilaterally. MRA in October of 2007 showed no obstructive disease. LHC 09/24/11: LM 30, oLAD 100%, dLAD fills via RV branch off RCA, IM diffuse 95% subtotal disease, mCFX 40%, OM1 normal, RCA normal with large RV branch supplying dLAD, PLA possibly occluded and most dRCA appears to fill from LIMA collats; LIMA patent but does not supply dLAD (connects via small vessel to ? DRCA/PLA, S-IM occluded). Circulation felt to be stable. Continue med Rx recommended. Nuclear study 2018 showed ejection fraction 46%. There was infarct but no ischemia. Patient had a fall in April 2019 and suffered a small subdural hematoma. He was ultimately also diagnosed with right pelvic fracture treated conservatively. CTA showed occlusion of the right popliteal artery of indeterminate chronicity; followed by vascular surgery. Patient also with hypotension/orthostasis. Patient had successful cardioversion on November 23, 2017.Amiodarone decreased to 100 mg daily October 2019 as patient complained of dizziness, tremors, and constipation. Follow-up echocardiogram January 2020 showed ejection fraction 35 to 40%, reduced flow at the apex with swirling, mild to moderate mitral regurgitation, biatrial enlargement and moderate to severe tricuspid regurgitation.In atrial flutter at last office visit.Since last seen, he denies chest pain, palpitations, syncope or pedal edema.  He does have dyspnea with exercise with which is unchanged.  Current Outpatient Medications  Medication Sig Dispense Refill  . alendronate (FOSAMAX) 70 MG tablet Take 70 mg by mouth every Sunday. Take with a full glass of water on an empty stomach.    Marland Kitchen  amiodarone (PACERONE) 200 MG tablet Take 0.5 tablets (100 mg total) by mouth daily. 45 tablet 3  . apixaban (ELIQUIS) 5 MG TABS tablet Take 1 tablet (5 mg total) by mouth 2 (two) times daily. 180 tablet 1  . Cholecalciferol (VITAMIN D) 2000 UNITS tablet Take 2,000 Units by mouth daily.    . fluticasone furoate-vilanterol (BREO ELLIPTA) 100-25 MCG/INH AEPB Inhale 1 puff into the lungs daily as needed (shortness of breath).     . furosemide (LASIX) 40 MG tablet Take 2 tablets (80 mg total) by mouth 2 (two) times daily. 360 tablet 3  . latanoprost (XALATAN) 0.005 % ophthalmic solution Place 1 drop into the left eye at bedtime.    Marland Kitchen levothyroxine (SYNTHROID) 25 MCG tablet Take 1 tablet (25 mcg total) by mouth daily before breakfast. 90 tablet 2  . loratadine (CLARITIN) 10 MG tablet Take 10 mg by mouth daily.    . metoprolol succinate (TOPROL XL) 25 MG 24 hr tablet Take 0.5 tablets (12.5 mg total) by mouth daily. 45 tablet 8  . Multiple Vitamin (MULTIVITAMIN WITH MINERALS) TABS tablet Take 1 tablet by mouth daily.    . tamsulosin (FLOMAX) 0.4 MG CAPS capsule Take 0.4 mg by mouth every evening.    . pravastatin (PRAVACHOL) 40 MG tablet Take 1 tablet (40 mg total) by mouth every evening. 90 tablet 3   No current facility-administered medications for this visit.     Past Medical History:  Diagnosis Date  . Asthma    with worsening with beta blockade  . Atrial fibrillation ([place])   . CAD (coronary artery disease)   . CVA (cerebral infarction) 2012   tia, mild no residual defecits  . GI bleed  8 yrs ago   due to doll fully vessel which was clipped  . Glaucoma    excellent cataracts  . History of kidney stones   . Ischemic cardiomyopathy   . Moderate to severe mitral regurgitation 06/16/2017  . Nephrolithiasis   . Post-infarction apical thrombus ([place])   . Prostate cancer ([place]) 2012  . Radiation proctitis Feb 2015   treated with APC  . Tubular adenoma 04/2013   Dr. Hilarie Fredrickson    Past Surgical  History:  Procedure Laterality Date  . cardiac stents  2003  . CARDIOVERSION N/A 06/15/2017   Procedure: CARDIOVERSION;  Surgeon: Acie Fredrickson, Wonda Cheng, MD;  Location: [place];  Service: Cardiovascular;  Laterality: N/A;  . CARDIOVERSION N/A 07/15/2017   Procedure: CARDIOVERSION;  Surgeon: Jerline Pain, MD;  Location: [facility] ENDOSCOPY;  Service: Cardiovascular;  Laterality: N/A;  . CARDIOVERSION N/A 07/20/2017   Procedure: CARDIOVERSION;  Surgeon: Herminio Commons, MD;  Location: [facility] ENDOSCOPY;  Service: Cardiovascular;  Laterality: N/A;  . CARDIOVERSION N/A 11/23/2017   Procedure: CARDIOVERSION;  Surgeon: Lelon Perla, MD;  Location: [place];  Service: Cardiovascular;  Laterality: N/A;  . COLONOSCOPY WITH PROPOFOL N/A 05/05/2013   Procedure: COLONOSCOPY WITH PROPOFOL;  Surgeon: Jerene Bears, MD;  Location: [facility] ENDOSCOPY;  Service: Gastroenterology;  Laterality: N/A;  . CORONARY ARTERY BYPASS GRAFT  1998   LIMA to the LAD and saphenous vein graft tothe diagonal  . EXTRACORPOREAL SHOCK WAVE LITHOTRIPSY Right 04/29/2017   Procedure: RIGHT EXTRACORPOREAL SHOCK WAVE LITHOTRIPSY (ESWL);  Surgeon: Alexis Frock, MD;  Location: [facility]S;  Service: Urology;  Laterality: Right;  . history of radiation treatment  2012   x 40 treatments done  . KNEE ARTHROSCOPY  2016 or 2017   Dr Gladstone Lighter ;   . PARTIAL KNEE ARTHROPLASTY Right 01/13/2017   Procedure: UNICOMPARTMENTAL RIGHT KNEE;  Surgeon: Gaynelle Arabian, MD;  Location: [facility];  Service: Orthopedics;  Laterality: Right;  with block    Social History   Socioeconomic History  . Marital status: Widowed    Spouse name: Not on file  . Number of children: 3  . Years of education: Not on file  . Highest education level: Not on file  Occupational History  . Occupation: Retired Conservation officer, [employer]  Tobacco Use  . Smoking status: Never Smoker  . Smokeless tobacco: Never Used  Vaping Use  . Vaping Use: Never used  Substance and Sexual Activity  .  Alcohol use: Yes    Alcohol/week: 0.0 standard drinks    Comment: occasional beer or wine  . Drug use: No  . Sexual activity: Not Currently    Birth control/protection: Abstinence  Other Topics Concern  . Not on file  Social History Narrative  . Not on file   Social Determinants of Health   Financial Resource Strain: Not on file  Food Insecurity: Not on file  Transportation Needs: Not on file  Physical Activity: Not on file  Stress: Not on file  Social Connections: Not on file  Intimate Partner Violence: Not on file    Family History  Problem Relation Age of Onset  . Heart attack Father   . Heart disease Mother   . Stomach cancer Mother     ROS: no fevers or chills, productive cough, hemoptysis, dysphasia, odynophagia, melena, hematochezia, dysuria, hematuria, rash, seizure activity, orthopnea, PND, pedal edema, claudication. Remaining systems are negative.  Physical Exam: Well-developed well-nourished in no acute distress.  Skin is warm and dry.  HEENT is normal.  Neck is supple.  Chest is clear to auscultation with normal expansion.  Cardiovascular exam is regular rate and rhythm.  Abdominal exam nontender or distended. No masses palpated. Extremities show no edema. neuro grossly intact  ECG-normal sinus rhythm at a rate of 63, right bundle branch block, inferior lateral infarct.  Personally reviewed  A/P  1 paroxysmal atrial fibrillation-patient is in sinus rhythm today on exam.  Continue amiodarone/Toprol.  Continue apixaban.  2 coronary artery disease-continue statin.  He is not on aspirin given need for anticoagulation.  3 chronic combined systolic/diastolic congestive heart failure-he is euvolemic.  Continue diuretic at present dose.  Check potassium and renal function.  4 ischemic cardiomyopathy-continue beta-blocker.  He is not on an ARB or ACE inhibitor given history of orthostasis.  5 history of mural apical thrombus-continue anticoagulation with  apixaban.  6 hyperlipidemia-continue statin.  7 peripheral vascular disease-followed by vascular surgery.  8 mild to moderate mitral regurgitation/moderate to severe tricuspid regurgitation-plan repeat echocardiogram.  Kirk Ruths, MD

## 2020-07-25 DIAGNOSIS — Z23 Encounter for immunization: Secondary | ICD-10-CM | POA: Diagnosis not present

## 2020-07-30 ENCOUNTER — Encounter: Payer: Self-pay | Admitting: Cardiology

## 2020-07-30 ENCOUNTER — Other Ambulatory Visit: Payer: Self-pay

## 2020-07-30 ENCOUNTER — Ambulatory Visit (INDEPENDENT_AMBULATORY_CARE_PROVIDER_SITE_OTHER): Payer: Medicare Other | Admitting: Cardiology

## 2020-07-30 VITALS — BP 118/66 | HR 63 | Ht 70.0 in | Wt 175.6 lb

## 2020-07-30 DIAGNOSIS — I2510 Atherosclerotic heart disease of native coronary artery without angina pectoris: Secondary | ICD-10-CM | POA: Diagnosis not present

## 2020-07-30 DIAGNOSIS — E7800 Pure hypercholesterolemia, unspecified: Secondary | ICD-10-CM | POA: Diagnosis not present

## 2020-07-30 DIAGNOSIS — I480 Paroxysmal atrial fibrillation: Secondary | ICD-10-CM | POA: Diagnosis not present

## 2020-07-30 DIAGNOSIS — I255 Ischemic cardiomyopathy: Secondary | ICD-10-CM | POA: Diagnosis not present

## 2020-07-30 DIAGNOSIS — I5022 Chronic systolic (congestive) heart failure: Secondary | ICD-10-CM

## 2020-07-30 LAB — BASIC METABOLIC PANEL
BUN/Creatinine Ratio: 20 (ref 10–24)
BUN: 25 mg/dL (ref 10–36)
CO2: 26 mmol/L (ref 20–29)
Calcium: 9 mg/dL (ref 8.6–10.2)
Chloride: 94 mmol/L — ABNORMAL LOW (ref 96–106)
Creatinine, Ser: 1.26 mg/dL (ref 0.76–1.27)
Glucose: 106 mg/dL — ABNORMAL HIGH (ref 65–99)
Potassium: 3.8 mmol/L (ref 3.5–5.2)
Sodium: 138 mmol/L (ref 134–144)
eGFR: 54 mL/min/{1.73_m2} — ABNORMAL LOW (ref >59)

## 2020-07-30 NOTE — Patient Instructions (Signed)
  Lab Work:  Your physician recommends that you HAVE LAB WORK TODAY  If you have labs (blood work) drawn today and your tests are completely normal, you will receive your results only by: . MyChart Message (if you have MyChart) OR . A paper copy in the mail If you have any lab test that is abnormal or we need to change your treatment, we will call you to review the results.   Testing/Procedures:  Your physician has requested that you have an echocardiogram. Echocardiography is a painless test that uses sound waves to create images of your heart. It provides your doctor with information about the size and shape of your heart and how well your heart's chambers and valves are working. This procedure takes approximately one hour. There are no restrictions for this procedure.[address]     Follow-Up: At [facility], you and your health needs are our priority.  As part of our continuing mission to provide you with exceptional heart care, we have created designated Provider Care Teams.  These Care Teams include your primary Cardiologist (physician) and Advanced Practice Providers (APPs -  Physician Assistants and Nurse Practitioners) who all work together to provide you with the care you need, when you need it.  We recommend signing up for the patient portal called "MyChart".  Sign up information is provided on this After Visit Summary.  MyChart is used to connect with patients for Virtual Visits (Telemedicine).  Patients are able to view lab/test results, encounter notes, upcoming appointments, etc.  Non-urgent messages can be sent to your provider as well.   To learn more about what you can do with MyChart, go to [email or web address].    Your next appointment:   6 month(s)  The format for your next appointment:   In Person  Provider:   Brian Crenshaw, MD    

## 2020-08-29 ENCOUNTER — Ambulatory Visit (HOSPITAL_COMMUNITY): Payer: Medicare Other | Attending: Cardiology

## 2020-08-29 ENCOUNTER — Other Ambulatory Visit: Payer: Self-pay

## 2020-08-29 DIAGNOSIS — I255 Ischemic cardiomyopathy: Secondary | ICD-10-CM | POA: Insufficient documentation

## 2020-08-29 LAB — ECHOCARDIOGRAM COMPLETE
AR max vel: 2.8 cm2
AV Area VTI: 2.41 cm2
AV Area mean vel: 2.39 cm2
AV Mean grad: 8.3 mmHg
AV Peak grad: 14.3 mmHg
Ao pk vel: 1.89 m/s
Area-P 1/2: 2.13 cm2
S' Lateral: 3.2 cm

## 2020-08-29 MED ORDER — PERFLUTREN LIPID MICROSPHERE
1.0000 mL | INTRAVENOUS | Status: AC | PRN
  Administered 2020-08-29: 1 mL via INTRAVENOUS

## 2020-09-07 ENCOUNTER — Other Ambulatory Visit: Payer: Self-pay | Admitting: Cardiology

## 2020-09-09 ENCOUNTER — Encounter: Payer: Self-pay | Admitting: *Deleted

## 2020-09-09 NOTE — Telephone Encounter (Signed)
24m, 79.7kg, scr 1.26 07/30/20, lovw/crenshaw 07/30/20

## 2020-10-09 DIAGNOSIS — L570 Actinic keratosis: Secondary | ICD-10-CM | POA: Diagnosis not present

## 2020-10-09 DIAGNOSIS — L218 Other seborrheic dermatitis: Secondary | ICD-10-CM | POA: Diagnosis not present

## 2020-10-09 DIAGNOSIS — L819 Disorder of pigmentation, unspecified: Secondary | ICD-10-CM | POA: Diagnosis not present

## 2020-10-09 DIAGNOSIS — B354 Tinea corporis: Secondary | ICD-10-CM | POA: Diagnosis not present

## 2020-10-14 ENCOUNTER — Telehealth: Payer: Self-pay | Admitting: Cardiology

## 2020-10-14 NOTE — Telephone Encounter (Signed)
I would recommend that the patient hold Eliquis ideally 48 hours prior to the dental extraction to avoid significant potential bleeding risk.  With that being the case, try to reschedule the dental extraction for either Wednesday afternoon or Thursday of this week less there is an absolute emergent indication for the extraction to be done today.

## 2020-10-14 NOTE — Telephone Encounter (Signed)
pt is on eliquis... would like to know if he should taking this rx nefore the extraction is done... please advise

## 2020-10-14 NOTE — Telephone Encounter (Addendum)
Returned call to Dr. Hale Drone office and spoke with Nevin Bloodgood who is caring for patient. Buddy Duty of Dr. Evette Georges (DOD) recommendations regarding patients Eliquis instructions regarding patients needed extraction. Per Nevin Bloodgood, this is not an urgent extraction and is one tooth only and can be rescheduled if needed.   Sharla Kidney Dr. Evette Georges advice and Nevin Bloodgood verbalized understanding.   Will fax this phone note to Dr. Hale Drone office.

## 2020-10-14 NOTE — Telephone Encounter (Signed)
Returned call to patient, who is at the Dentist having dental work. Patient put nurse from dentist office on the phone. Nurse from Dentist states that patient is currently in the chair for tooth extraction and is on Eliquis. Dental office is asking if patient can have extraction today without holding the Eliquis or if he will need to return on another day and hold the Eliquis before.   Advised dental office I will forward message to our DOD- for him to review and advise.   Call back number to dentist is- [phone] Dr. Philipp Deputy DDS

## 2020-10-28 ENCOUNTER — Telehealth: Payer: Self-pay | Admitting: Cardiology

## 2020-10-28 NOTE — Telephone Encounter (Signed)
Spoke with pt, he would like a list of his medications emailed to him for his dentist appointment tomorrow. Email sent to patient.

## 2020-10-28 NOTE — Telephone Encounter (Signed)
Patient calling to speak with Hilda Blades. States that he has a favor to ask her and can explain it better to her when he speaks with her.

## 2020-10-31 DIAGNOSIS — H353132 Nonexudative age-related macular degeneration, bilateral, intermediate dry stage: Secondary | ICD-10-CM | POA: Diagnosis not present

## 2020-10-31 DIAGNOSIS — H4720 Unspecified optic atrophy: Secondary | ICD-10-CM | POA: Diagnosis not present

## 2020-11-08 DIAGNOSIS — E039 Hypothyroidism, unspecified: Secondary | ICD-10-CM | POA: Diagnosis not present

## 2020-11-08 DIAGNOSIS — R7301 Impaired fasting glucose: Secondary | ICD-10-CM | POA: Diagnosis not present

## 2020-11-08 DIAGNOSIS — M810 Age-related osteoporosis without current pathological fracture: Secondary | ICD-10-CM | POA: Diagnosis not present

## 2020-11-08 DIAGNOSIS — I110 Hypertensive heart disease with heart failure: Secondary | ICD-10-CM | POA: Diagnosis not present

## 2020-11-15 DIAGNOSIS — I110 Hypertensive heart disease with heart failure: Secondary | ICD-10-CM | POA: Diagnosis not present

## 2020-11-15 DIAGNOSIS — M810 Age-related osteoporosis without current pathological fracture: Secondary | ICD-10-CM | POA: Diagnosis not present

## 2020-11-15 DIAGNOSIS — J449 Chronic obstructive pulmonary disease, unspecified: Secondary | ICD-10-CM | POA: Diagnosis not present

## 2020-11-15 DIAGNOSIS — N401 Enlarged prostate with lower urinary tract symptoms: Secondary | ICD-10-CM | POA: Diagnosis not present

## 2020-11-15 DIAGNOSIS — Z0000 Encounter for general adult medical examination without abnormal findings: Secondary | ICD-10-CM | POA: Diagnosis not present

## 2020-11-15 DIAGNOSIS — R82998 Other abnormal findings in urine: Secondary | ICD-10-CM | POA: Diagnosis not present

## 2020-11-15 DIAGNOSIS — N1831 Chronic kidney disease, stage 3a: Secondary | ICD-10-CM | POA: Diagnosis not present

## 2020-11-15 DIAGNOSIS — D692 Other nonthrombocytopenic purpura: Secondary | ICD-10-CM | POA: Diagnosis not present

## 2020-11-15 DIAGNOSIS — E039 Hypothyroidism, unspecified: Secondary | ICD-10-CM | POA: Diagnosis not present

## 2020-11-15 DIAGNOSIS — I480 Paroxysmal atrial fibrillation: Secondary | ICD-10-CM | POA: Diagnosis not present

## 2020-11-15 DIAGNOSIS — Z1331 Encounter for screening for depression: Secondary | ICD-10-CM | POA: Diagnosis not present

## 2020-11-15 DIAGNOSIS — I509 Heart failure, unspecified: Secondary | ICD-10-CM | POA: Diagnosis not present

## 2020-11-15 DIAGNOSIS — Z9861 Coronary angioplasty status: Secondary | ICD-10-CM | POA: Diagnosis not present

## 2020-11-15 DIAGNOSIS — Z1389 Encounter for screening for other disorder: Secondary | ICD-10-CM | POA: Diagnosis not present

## 2020-11-15 DIAGNOSIS — Z7901 Long term (current) use of anticoagulants: Secondary | ICD-10-CM | POA: Diagnosis not present

## 2020-12-12 DIAGNOSIS — Z23 Encounter for immunization: Secondary | ICD-10-CM | POA: Diagnosis not present

## 2021-01-02 DIAGNOSIS — Z20822 Contact with and (suspected) exposure to covid-19: Secondary | ICD-10-CM | POA: Diagnosis not present

## 2021-01-03 DIAGNOSIS — Z20822 Contact with and (suspected) exposure to covid-19: Secondary | ICD-10-CM | POA: Diagnosis not present

## 2021-02-05 DIAGNOSIS — L570 Actinic keratosis: Secondary | ICD-10-CM | POA: Diagnosis not present

## 2021-02-05 DIAGNOSIS — Z85828 Personal history of other malignant neoplasm of skin: Secondary | ICD-10-CM | POA: Diagnosis not present

## 2021-02-05 DIAGNOSIS — Z08 Encounter for follow-up examination after completed treatment for malignant neoplasm: Secondary | ICD-10-CM | POA: Diagnosis not present

## 2021-02-18 DIAGNOSIS — Z20828 Contact with and (suspected) exposure to other viral communicable diseases: Secondary | ICD-10-CM | POA: Diagnosis not present

## 2021-03-23 ENCOUNTER — Other Ambulatory Visit: Payer: Self-pay | Admitting: Cardiology

## 2021-03-23 DIAGNOSIS — R7989 Other specified abnormal findings of blood chemistry: Secondary | ICD-10-CM

## 2021-03-23 DIAGNOSIS — I480 Paroxysmal atrial fibrillation: Secondary | ICD-10-CM

## 2021-03-23 DIAGNOSIS — I2510 Atherosclerotic heart disease of native coronary artery without angina pectoris: Secondary | ICD-10-CM

## 2021-03-25 NOTE — Telephone Encounter (Signed)
Prescription refill request for Eliquis received. Indication: a fib Last office visit: 07/30/20 Scr: 1.26 Age: 87 Weight: 79 kg

## 2021-05-05 ENCOUNTER — Telehealth: Payer: Self-pay | Admitting: Cardiology

## 2021-05-05 DIAGNOSIS — I480 Paroxysmal atrial fibrillation: Secondary | ICD-10-CM

## 2021-05-05 NOTE — Telephone Encounter (Signed)
°*  STAT* If patient is at the pharmacy, call can be transferred to refill team.   1. Which medications need to be refilled? (please list name of each medication and dose if known) furosemide (LASIX) 40 MG tablet amiodarone (PACERONE) 200 MG tablet  2. Which pharmacy/location (including street and city if local pharmacy) is medication to be sent to? [facility] [place] [place] [facility]  3. Do they need a 30 day or 90 day supply?  90 day supply

## 2021-05-09 DIAGNOSIS — H4720 Unspecified optic atrophy: Secondary | ICD-10-CM | POA: Diagnosis not present

## 2021-05-12 MED ORDER — FUROSEMIDE 40 MG PO TABS: 80.0000 mg | ORAL_TABLET | Freq: Two times a day (BID) | ORAL | 0 refills | Status: DC

## 2021-05-12 MED ORDER — AMIODARONE HCL 200 MG PO TABS: 100.0000 mg | ORAL_TABLET | Freq: Every day | ORAL | 3 refills | Status: DC

## 2021-05-12 NOTE — Telephone Encounter (Signed)
Spoke to the patient. He was calling in for a refill. He has been advised that he is past due for a follow up with Dr. [name]. Message sent to scheduling to help get this scheduled.

## 2021-05-12 NOTE — Telephone Encounter (Signed)
Patient called to check on status of his refill, he is almost of of medication.

## 2021-05-14 ENCOUNTER — Encounter: Payer: Self-pay | Admitting: Cardiology

## 2021-05-14 ENCOUNTER — Ambulatory Visit (HOSPITAL_BASED_OUTPATIENT_CLINIC_OR_DEPARTMENT_OTHER)
Admission: RE | Admit: 2021-05-14 | Discharge: 2021-05-14 | Disposition: A | Discharge status: Home / Self Care | Payer: Medicare Other | Source: Ambulatory Visit | Attending: Cardiology | Admitting: Cardiology

## 2021-05-14 ENCOUNTER — Other Ambulatory Visit: Payer: Self-pay

## 2021-05-14 ENCOUNTER — Ambulatory Visit (INDEPENDENT_AMBULATORY_CARE_PROVIDER_SITE_OTHER): Payer: Medicare Other | Admitting: Cardiology

## 2021-05-14 ENCOUNTER — Other Ambulatory Visit: Payer: Self-pay | Admitting: *Deleted

## 2021-05-14 VITALS — BP 102/64 | HR 111 | Ht 70.0 in | Wt 175.0 lb

## 2021-05-14 DIAGNOSIS — I4892 Unspecified atrial flutter: Secondary | ICD-10-CM | POA: Insufficient documentation

## 2021-05-14 DIAGNOSIS — I2510 Atherosclerotic heart disease of native coronary artery without angina pectoris: Secondary | ICD-10-CM | POA: Diagnosis not present

## 2021-05-14 DIAGNOSIS — R946 Abnormal results of thyroid function studies: Secondary | ICD-10-CM | POA: Diagnosis not present

## 2021-05-14 DIAGNOSIS — I255 Ischemic cardiomyopathy: Secondary | ICD-10-CM

## 2021-05-14 DIAGNOSIS — E7800 Pure hypercholesterolemia, unspecified: Secondary | ICD-10-CM | POA: Diagnosis not present

## 2021-05-14 DIAGNOSIS — I480 Paroxysmal atrial fibrillation: Secondary | ICD-10-CM | POA: Insufficient documentation

## 2021-05-14 DIAGNOSIS — J449 Chronic obstructive pulmonary disease, unspecified: Secondary | ICD-10-CM | POA: Diagnosis not present

## 2021-05-14 NOTE — Patient Instructions (Addendum)
A chest x-ray takes a picture of the organs and structures inside the chest, including the heart, lungs, and blood vessels. This test can show several things, including, whether the heart is enlarges; whether fluid is building up in the lungs; and whether pacemaker / defibrillator leads are still in place. [facility] [facility] 1ST FLOOR IMAGING DEPARTMENT    You are scheduled for a Cardioversion on 06/03/21 with Dr. Gardiner Rhyme.  Please arrive at the [facility] (Main Entrance A) at [facility]: [address] [place], [place] [address] at 9 am. (1 hour prior to procedure unless lab work is needed; if lab work is needed arrive 1.5 hours ahead)  DIET: Nothing to eat or drink after midnight except a sip of water with medications (see medication instructions below)  FYI: For your safety, and to allow [place] to monitor your vital signs accurately during the surgery/procedure we request that   if you have artificial nails, gel coating, SNS etc. Please have those removed prior to your surgery/procedure. Not having the nail coverings /polish removed may result in cancellation or delay of your surgery/procedure.   Medication Instructions:        DO NOT TAKE METOPROLOL THE MORNING OF THE PROCEDURE  Continue your anticoagulant: ELIQUIS   You must have a responsible person to drive you home and stay in the waiting area during your procedure. Failure to do so could result in cancellation.  Bring your insurance cards.  *Special Note: Every effort is made to have your procedure done on time. Occasionally there are emergencies that occur at the hospital that may cause delays. Please be patient if a delay does occur.     Your physician recommends that you schedule a follow-up appointment in: [place]

## 2021-05-14 NOTE — Progress Notes (Signed)
HPI:FU CAD, CHF and atrial fibrillation. He is status post coronary bypassing graft in 1990; also with h/o ischemic cardiomyopathy, prior stroke and prior mural apical thrombus. Prior abdominal ultrasound in May 2006 showed no aneurysm. Carotid Dopplers in November of 2005 showed 0-39% bilaterally.  LHC 09/24/11: LM 30, oLAD 100%, dLAD fills via RV branch off RCA, IM diffuse 95% subtotal disease, mCFX 40%, OM1 normal, RCA normal with large RV branch supplying dLAD, PLA possibly occluded and most dRCA appears to fill from LIMA collats; LIMA patent but does not supply dLAD (connects via small vessel to ? DRCA/PLA, S-IM occluded). Circulation felt to be stable. Continue med Rx recommended. Nuclear study 2018 showed ejection fraction 46%. There was infarct but no ischemia.  Patient had a fall in April 2019 and suffered a small subdural hematoma.  He was ultimately also diagnosed with right pelvic fracture treated conservatively. CTA showed occlusion of the right popliteal artery of indeterminate chronicity; followed by vascular surgery.  Patient also with hypotension/orthostasis. Patient had successful cardioversion on November 23, 2017. Amiodarone decreased to 100 mg daily October 2019 as patient complained of dizziness, tremors, and constipation. Follow-up echocardiogram 6/22 showed ejection fraction 45 to 50%, moderate RVE, moderate RV dysfunction, mild LAE, severe RAE, mild MR, moderate TR, mild AS. Since last seen, patient denies increased dyspnea, chest pain, palpitations, syncope or bleeding.  Current Outpatient Medications  Medication Sig Dispense Refill   alendronate (FOSAMAX) 70 MG tablet Take 70 mg by mouth every Sunday. Take with a full glass of water on an empty stomach.     amiodarone (PACERONE) 200 MG tablet Take 0.5 tablets (100 mg total) by mouth daily. 45 tablet 3   apixaban (ELIQUIS) 5 MG TABS tablet TAKE 1 TABLET TWICE A DAY 180 tablet 0   Cholecalciferol (VITAMIN D) 2000 UNITS tablet  Take 2,000 Units by mouth daily.     fluticasone furoate-vilanterol (BREO ELLIPTA) 100-25 MCG/INH AEPB Inhale 1 puff into the lungs daily as needed (shortness of breath).      furosemide (LASIX) 40 MG tablet Take 2 tablets (80 mg total) by mouth 2 (two) times daily. 360 tablet 0   latanoprost (XALATAN) 0.005 % ophthalmic solution Place 1 drop into the left eye at bedtime.     levothyroxine (SYNTHROID) 25 MCG tablet TAKE 1 TABLET DAILY BEFORE BREAKFAST 90 tablet 2   loratadine (CLARITIN) 10 MG tablet Take 10 mg by mouth daily.     metoprolol succinate (TOPROL XL) 25 MG 24 hr tablet Take 0.5 tablets (12.5 mg total) by mouth daily. 45 tablet 8   Multiple Vitamin (MULTIVITAMIN WITH MINERALS) TABS tablet Take 1 tablet by mouth daily.     pravastatin (PRAVACHOL) 40 MG tablet TAKE 1 TABLET EVERY EVENING 90 tablet 3   tamsulosin (FLOMAX) 0.4 MG CAPS capsule Take 0.4 mg by mouth every evening.     No current facility-administered medications for this visit.     Past Medical History:  Diagnosis Date   Asthma    with worsening with beta blockade   Atrial fibrillation (HCC)    CAD (coronary artery disease)    CVA (cerebral infarction) 2012   tia, mild no residual defecits   GI bleed 8 yrs ago   due to doll fully vessel which was clipped   Glaucoma    excellent cataracts   History of kidney stones    Ischemic cardiomyopathy    Moderate to severe mitral regurgitation 06/16/2017   Nephrolithiasis  Post-infarction apical thrombus [facility])    Prostate cancer [facility]) 2012   Radiation proctitis Feb 2015   treated with APC   Tubular adenoma 04/2013   Dr. Hilarie Fredrickson    Past Surgical History:  Procedure Laterality Date   cardiac stents  2003   CARDIOVERSION N/A 06/15/2017   Procedure: CARDIOVERSION;  Surgeon: Acie Fredrickson Wonda Cheng, MD;  Location: [place];  Service: Cardiovascular;  Laterality: N/A;   CARDIOVERSION N/A 07/15/2017   Procedure: CARDIOVERSION;  Surgeon: Jerline Pain, MD;  Location: [place];  Service: Cardiovascular;  Laterality: N/A;   CARDIOVERSION N/A 07/20/2017   Procedure: CARDIOVERSION;  Surgeon: Herminio Commons, MD;  Location: [facility] ENDOSCOPY;  Service: Cardiovascular;  Laterality: N/A;   CARDIOVERSION N/A 11/23/2017   Procedure: CARDIOVERSION;  Surgeon: Lelon Perla, MD;  Location: [place];  Service: Cardiovascular;  Laterality: N/A;   COLONOSCOPY WITH PROPOFOL N/A 05/05/2013   Procedure: COLONOSCOPY WITH PROPOFOL;  Surgeon: Jerene Bears, MD;  Location: [facility] ENDOSCOPY;  Service: Gastroenterology;  Laterality: N/A;   CORONARY ARTERY BYPASS GRAFT  1998   LIMA to the LAD and saphenous vein graft tothe diagonal   EXTRACORPOREAL SHOCK WAVE LITHOTRIPSY Right 04/29/2017   Procedure: RIGHT EXTRACORPOREAL SHOCK WAVE LITHOTRIPSY (ESWL);  Surgeon: Alexis Frock, MD;  Location: [facility];  Service: Urology;  Laterality: Right;   history of radiation treatment  2012   x 40 treatments done   KNEE ARTHROSCOPY  2016 or 2017   Dr Gladstone Lighter ;    PARTIAL KNEE ARTHROPLASTY Right 01/13/2017   Procedure: UNICOMPARTMENTAL RIGHT KNEE;  Surgeon: Gaynelle Arabian, MD;  Location: [facility];  Service: Orthopedics;  Laterality: Right;  with block    Social History   Socioeconomic History   Marital status: Widowed    Spouse name: Not on file   Number of children: 3   Years of education: Not on file   Highest education level: Not on file  Occupational History   Occupation: Retired Conservation officer, nature  Tobacco Use   Smoking status: Never   Smokeless tobacco: Never  Vaping Use   Vaping Use: Never used  Substance and Sexual Activity   Alcohol use: Yes    Alcohol/week: 0.0 standard drinks    Comment: occasional beer or wine   Drug use: No   Sexual activity: Not Currently    Birth control/protection: Abstinence  Other Topics Concern   Not on file  Social History Narrative   Not on file   Social Determinants of Health   Financial Resource Strain: Not on file  Food Insecurity:  Not on file  Transportation Needs: Not on file  Physical Activity: Not on file  Stress: Not on file  Social Connections: Not on file  Intimate Partner Violence: Not on file    Family History  Problem Relation Age of Onset   Heart attack Father    Heart disease Mother    Stomach cancer Mother     ROS: no fevers or chills, productive cough, hemoptysis, dysphasia, odynophagia, melena, hematochezia, dysuria, hematuria, rash, seizure activity, orthopnea, PND, pedal edema, claudication. Remaining systems are negative.  Physical Exam: Well-developed well-nourished in no acute distress.  Skin is warm and dry.  HEENT is normal.  Neck is supple.  Chest is clear to auscultation with normal expansion.  Cardiovascular exam is regular rate and rhythm.  Abdominal exam nontender or distended. No masses palpated. Extremities show no edema. neuro grossly intact  ECG-atrial flutter, right axis deviation, right bundle branch block.  Personally  reviewed  A/P  1 paroxysmal atrial fibrillation-patient is in atrial flutter today.  Rate is mildly elevated.  He has been compliant with apixaban.  I will plan to proceed with cardioversion electively.  Continue amiodarone/Toprol.  Note I would have like to increase amiodarone to help maintain sinus rhythm but he had side effects at 200 mg daily.  Hopefully he will hold sinus rhythm following cardioversion.  Check TSH, LFTs and chest xray. Continue apixaban. Check hgb and renal function.    2 coronary artery disease-Denies CP. Continue statin.  He is not on aspirin given need for anticoagulation.   3 chronic combined systolic/diastolic congestive heart failure-he is euvolemic.  Continue diuretic at present dose.  Check potassium and renal function.   4 ischemic cardiomyopathy-continue beta-blocker.  He is not on an ARB or ACE inhibitor given history of orthostasis.   5 history of mural apical thrombus-continue anticoagulation with apixaban.   6  hyperlipidemia-continue statin.  Check lipids and liver.   7 peripheral vascular disease-followed by vascular surgery.   Kirk Ruths, MD

## 2021-05-14 NOTE — H&P (View-Only) (Signed)
HPI:FU CAD, CHF and atrial fibrillation. He is status post coronary bypassing graft in 1990; also with h/o ischemic cardiomyopathy, prior stroke and prior mural apical thrombus. Prior abdominal ultrasound in May 2006 showed no aneurysm. Carotid Dopplers in November of 2005 showed 0-39% bilaterally.  LHC 09/24/11: LM 30, oLAD 100%, dLAD fills via RV branch off RCA, IM diffuse 95% subtotal disease, mCFX 40%, OM1 normal, RCA normal with large RV branch supplying dLAD, PLA possibly occluded and most dRCA appears to fill from LIMA collats; LIMA patent but does not supply dLAD (connects via small vessel to ? DRCA/PLA, S-IM occluded). Circulation felt to be stable. Continue med Rx recommended. Nuclear study 2018 showed ejection fraction 46%. There was infarct but no ischemia.  Patient had a fall in April 2019 and suffered a small subdural hematoma.  He was ultimately also diagnosed with right pelvic fracture treated conservatively. CTA showed occlusion of the right popliteal artery of indeterminate chronicity; followed by vascular surgery.  Patient also with hypotension/orthostasis. Patient had successful cardioversion on November 23, 2017. Amiodarone decreased to 100 mg daily October 2019 as patient complained of dizziness, tremors, and constipation. Follow-up echocardiogram 6/22 showed ejection fraction 45 to 50%, moderate RVE, moderate RV dysfunction, mild LAE, severe RAE, mild MR, moderate TR, mild AS. Since last seen, patient denies increased dyspnea, chest pain, palpitations, syncope or bleeding.  Current Outpatient Medications  Medication Sig Dispense Refill   alendronate (FOSAMAX) 70 MG tablet Take 70 mg by mouth every Sunday. Take with a full glass of water on an empty stomach.     amiodarone (PACERONE) 200 MG tablet Take 0.5 tablets (100 mg total) by mouth daily. 45 tablet 3   apixaban (ELIQUIS) 5 MG TABS tablet TAKE 1 TABLET TWICE A DAY 180 tablet 0   Cholecalciferol (VITAMIN D) 2000 UNITS tablet  Take 2,000 Units by mouth daily.     fluticasone furoate-vilanterol (BREO ELLIPTA) 100-25 MCG/INH AEPB Inhale 1 puff into the lungs daily as needed (shortness of breath).      furosemide (LASIX) 40 MG tablet Take 2 tablets (80 mg total) by mouth 2 (two) times daily. 360 tablet 0   latanoprost (XALATAN) 0.005 % ophthalmic solution Place 1 drop into the left eye at bedtime.     levothyroxine (SYNTHROID) 25 MCG tablet TAKE 1 TABLET DAILY BEFORE BREAKFAST 90 tablet 2   loratadine (CLARITIN) 10 MG tablet Take 10 mg by mouth daily.     metoprolol succinate (TOPROL XL) 25 MG 24 hr tablet Take 0.5 tablets (12.5 mg total) by mouth daily. 45 tablet 8   Multiple Vitamin (MULTIVITAMIN WITH MINERALS) TABS tablet Take 1 tablet by mouth daily.     pravastatin (PRAVACHOL) 40 MG tablet TAKE 1 TABLET EVERY EVENING 90 tablet 3   tamsulosin (FLOMAX) 0.4 MG CAPS capsule Take 0.4 mg by mouth every evening.     No current facility-administered medications for this visit.     Past Medical History:  Diagnosis Date   Asthma    with worsening with beta blockade   Atrial fibrillation (HCC)    CAD (coronary artery disease)    CVA (cerebral infarction) 2012   tia, mild no residual defecits   GI bleed 8 yrs ago   due to doll fully vessel which was clipped   Glaucoma    excellent cataracts   History of kidney stones    Ischemic cardiomyopathy    Moderate to severe mitral regurgitation 06/16/2017   Nephrolithiasis  Post-infarction apical thrombus [facility])    Prostate cancer [facility]) 2012   Radiation proctitis Feb 2015   treated with APC   Tubular adenoma 04/2013   Dr. Hilarie Fredrickson    Past Surgical History:  Procedure Laterality Date   cardiac stents  2003   CARDIOVERSION N/A 06/15/2017   Procedure: CARDIOVERSION;  Surgeon: Acie Fredrickson Wonda Cheng, MD;  Location: [place];  Service: Cardiovascular;  Laterality: N/A;   CARDIOVERSION N/A 07/15/2017   Procedure: CARDIOVERSION;  Surgeon: Jerline Pain, MD;  Location: [place];  Service: Cardiovascular;  Laterality: N/A;   CARDIOVERSION N/A 07/20/2017   Procedure: CARDIOVERSION;  Surgeon: Herminio Commons, MD;  Location: [facility] ENDOSCOPY;  Service: Cardiovascular;  Laterality: N/A;   CARDIOVERSION N/A 11/23/2017   Procedure: CARDIOVERSION;  Surgeon: Lelon Perla, MD;  Location: [place];  Service: Cardiovascular;  Laterality: N/A;   COLONOSCOPY WITH PROPOFOL N/A 05/05/2013   Procedure: COLONOSCOPY WITH PROPOFOL;  Surgeon: Jerene Bears, MD;  Location: [facility]OS[facility];  Service: Gastroenterology;  Laterality: N/A;   CORONARY ARTERY BYPASS GRAFT  1998   LIMA to the LAD and saphenous vein graft tothe diagonal   EXTRACORPOREAL SHOCK WAVE LITHOTRIPSY Right 04/29/2017   Procedure: RIGHT EXTRACORPOREAL SHOCK WAVE LITHOTRIPSY (ESWL);  Surgeon: Alexis Frock, MD;  Location: [facility];  Service: Urology;  Laterality: Right;   history of radiation treatment  2012   x 40 treatments done   KNEE ARTHROSCOPY  2016 or 2017   Dr Gladstone Lighter ;    PARTIAL KNEE ARTHROPLASTY Right 01/13/2017   Procedure: UNICOMPARTMENTAL RIGHT KNEE;  Surgeon: Gaynelle Arabian, MD;  Location: [facility];  Service: Orthopedics;  Laterality: Right;  with block    Social History   Socioeconomic History   Marital status: Widowed    Spouse name: Not on file   Number of children: 3   Years of education: Not on file   Highest education level: Not on file  Occupational History   Occupation: Retired Conservation officer, nature  Tobacco Use   Smoking status: Never   Smokeless tobacco: Never  Vaping Use   Vaping Use: Never used  Substance and Sexual Activity   Alcohol use: Yes    Alcohol/week: 0.0 standard drinks    Comment: occasional beer or wine   Drug use: No   Sexual activity: Not Currently    Birth control/protection: Abstinence  Other Topics Concern   Not on file  Social History Narrative   Not on file   Social Determinants of Health   Financial Resource Strain: Not on file  Food Insecurity:  Not on file  Transportation Needs: Not on file  Physical Activity: Not on file  Stress: Not on file  Social Connections: Not on file  Intimate Partner Violence: Not on file    Family History  Problem Relation Age of Onset   Heart attack Father    Heart disease Mother    Stomach cancer Mother     ROS: no fevers or chills, productive cough, hemoptysis, dysphasia, odynophagia, melena, hematochezia, dysuria, hematuria, rash, seizure activity, orthopnea, PND, pedal edema, claudication. Remaining systems are negative.  Physical Exam: Well-developed well-nourished in no acute distress.  Skin is warm and dry.  HEENT is normal.  Neck is supple.  Chest is clear to auscultation with normal expansion.  Cardiovascular exam is regular rate and rhythm.  Abdominal exam nontender or distended. No masses palpated. Extremities show no edema. neuro grossly intact  ECG-atrial flutter, right axis deviation, right bundle branch block.  Personally  reviewed  A/P  1 paroxysmal atrial fibrillation-patient is in atrial flutter today.  Rate is mildly elevated.  He has been compliant with apixaban.  I will plan to proceed with cardioversion electively.  Continue amiodarone/Toprol.  Note I would have like to increase amiodarone to help maintain sinus rhythm but he had side effects at 200 mg daily.  Hopefully he will hold sinus rhythm following cardioversion.  Check TSH, LFTs and chest xray. Continue apixaban. Check hgb and renal function.    2 coronary artery disease-Denies CP. Continue statin.  He is not on aspirin given need for anticoagulation.   3 chronic combined systolic/diastolic congestive heart failure-he is euvolemic.  Continue diuretic at present dose.  Check potassium and renal function.   4 ischemic cardiomyopathy-continue beta-blocker.  He is not on an ARB or ACE inhibitor given history of orthostasis.   5 history of mural apical thrombus-continue anticoagulation with apixaban.   6  hyperlipidemia-continue statin.  Check lipids and liver.   7 peripheral vascular disease-followed by vascular surgery.   Kirk Ruths, MD

## 2021-05-15 ENCOUNTER — Other Ambulatory Visit: Payer: Self-pay | Admitting: *Deleted

## 2021-05-15 DIAGNOSIS — Z79899 Other long term (current) drug therapy: Secondary | ICD-10-CM

## 2021-05-15 DIAGNOSIS — R7989 Other specified abnormal findings of blood chemistry: Secondary | ICD-10-CM

## 2021-05-15 LAB — COMPREHENSIVE METABOLIC PANEL
ALT: 13 IU/L (ref 0–44)
AST: 19 IU/L (ref 0–40)
Albumin/Globulin Ratio: 1.6 (ref 1.2–2.2)
Albumin: 4.1 g/dL (ref 3.5–4.6)
Alkaline Phosphatase: 63 IU/L (ref 44–121)
BUN/Creatinine Ratio: 20 (ref 10–24)
BUN: 23 mg/dL (ref 10–36)
Bilirubin Total: 0.4 mg/dL (ref 0.0–1.2)
CO2: 31 mmol/L — ABNORMAL HIGH (ref 20–29)
Calcium: 9.6 mg/dL (ref 8.6–10.2)
Chloride: 99 mmol/L (ref 96–106)
Creatinine, Ser: 1.15 mg/dL (ref 0.76–1.27)
Globulin, Total: 2.5 g/dL (ref 1.5–4.5)
Glucose: 93 mg/dL (ref 70–99)
Potassium: 4.4 mmol/L (ref 3.5–5.2)
Sodium: 142 mmol/L (ref 134–144)
Total Protein: 6.6 g/dL (ref 6.0–8.5)
eGFR: 60 mL/min/{1.73_m2} (ref >59)

## 2021-05-15 LAB — CBC
Hematocrit: 44.3 % (ref 37.5–51.0)
Hemoglobin: 14.9 g/dL (ref 13.0–17.7)
MCH: 31.8 pg (ref 26.6–33.0)
MCHC: 33.6 g/dL (ref 31.5–35.7)
MCV: 95 fL (ref 79–97)
Platelets: 243 10*3/uL (ref 150–450)
RBC: 4.69 x10E6/uL (ref 4.14–5.80)
RDW: 12.1 % (ref 11.6–15.4)
WBC: 5.8 10*3/uL (ref 3.4–10.8)

## 2021-05-15 LAB — LIPID PANEL
Chol/HDL Ratio: 2.4 ratio (ref 0.0–5.0)
Cholesterol, Total: 129 mg/dL (ref 100–199)
HDL: 54 mg/dL (ref >39)
LDL Chol Calc (NIH): 60 mg/dL (ref 0–99)
Triglycerides: 76 mg/dL (ref 0–149)
VLDL Cholesterol Cal: 15 mg/dL (ref 5–40)

## 2021-05-15 LAB — TSH: TSH: 7.69 u[IU]/mL — ABNORMAL HIGH (ref 0.450–4.500)

## 2021-05-15 NOTE — Progress Notes (Signed)
Free t 4

## 2021-05-20 LAB — SPECIMEN STATUS REPORT

## 2021-05-20 LAB — T4, FREE: Free T4: 1.28 ng/dL (ref 0.82–1.77)

## 2021-05-22 DIAGNOSIS — I509 Heart failure, unspecified: Secondary | ICD-10-CM | POA: Diagnosis not present

## 2021-05-22 DIAGNOSIS — M810 Age-related osteoporosis without current pathological fracture: Secondary | ICD-10-CM | POA: Diagnosis not present

## 2021-05-22 DIAGNOSIS — I679 Cerebrovascular disease, unspecified: Secondary | ICD-10-CM | POA: Diagnosis not present

## 2021-05-22 DIAGNOSIS — Z1339 Encounter for screening examination for other mental health and behavioral disorders: Secondary | ICD-10-CM | POA: Diagnosis not present

## 2021-05-22 DIAGNOSIS — Z9861 Coronary angioplasty status: Secondary | ICD-10-CM | POA: Diagnosis not present

## 2021-05-22 DIAGNOSIS — D692 Other nonthrombocytopenic purpura: Secondary | ICD-10-CM | POA: Diagnosis not present

## 2021-05-22 DIAGNOSIS — Z1331 Encounter for screening for depression: Secondary | ICD-10-CM | POA: Diagnosis not present

## 2021-05-22 DIAGNOSIS — I480 Paroxysmal atrial fibrillation: Secondary | ICD-10-CM | POA: Diagnosis not present

## 2021-05-22 DIAGNOSIS — N1831 Chronic kidney disease, stage 3a: Secondary | ICD-10-CM | POA: Diagnosis not present

## 2021-05-22 DIAGNOSIS — J449 Chronic obstructive pulmonary disease, unspecified: Secondary | ICD-10-CM | POA: Diagnosis not present

## 2021-05-22 DIAGNOSIS — R7301 Impaired fasting glucose: Secondary | ICD-10-CM | POA: Diagnosis not present

## 2021-05-22 DIAGNOSIS — Z7901 Long term (current) use of anticoagulants: Secondary | ICD-10-CM | POA: Diagnosis not present

## 2021-05-22 DIAGNOSIS — E039 Hypothyroidism, unspecified: Secondary | ICD-10-CM | POA: Diagnosis not present

## 2021-05-22 DIAGNOSIS — I110 Hypertensive heart disease with heart failure: Secondary | ICD-10-CM | POA: Diagnosis not present

## 2021-05-26 ENCOUNTER — Encounter (HOSPITAL_COMMUNITY): Payer: Self-pay | Admitting: Cardiology

## 2021-06-03 ENCOUNTER — Ambulatory Visit (HOSPITAL_COMMUNITY): Payer: Medicare Other | Admitting: Anesthesiology

## 2021-06-03 ENCOUNTER — Ambulatory Visit (HOSPITAL_BASED_OUTPATIENT_CLINIC_OR_DEPARTMENT_OTHER): Payer: Medicare Other | Admitting: Anesthesiology

## 2021-06-03 ENCOUNTER — Ambulatory Visit (HOSPITAL_COMMUNITY)
Admission: RE | Admit: 2021-06-03 | Discharge: 2021-06-03 | Disposition: A | Discharge status: Home / Self Care | Payer: Medicare Other | Source: Ambulatory Visit | Attending: Cardiology | Admitting: Cardiology

## 2021-06-03 ENCOUNTER — Encounter (HOSPITAL_COMMUNITY)
Admission: RE | Disposition: A | Discharge status: Home / Self Care | Payer: Self-pay | Source: Ambulatory Visit | Attending: Cardiology

## 2021-06-03 ENCOUNTER — Other Ambulatory Visit: Payer: Self-pay

## 2021-06-03 ENCOUNTER — Encounter (HOSPITAL_COMMUNITY): Payer: Self-pay | Admitting: Cardiology

## 2021-06-03 DIAGNOSIS — I951 Orthostatic hypotension: Secondary | ICD-10-CM | POA: Diagnosis not present

## 2021-06-03 DIAGNOSIS — I5042 Chronic combined systolic (congestive) and diastolic (congestive) heart failure: Secondary | ICD-10-CM

## 2021-06-03 DIAGNOSIS — I4892 Unspecified atrial flutter: Secondary | ICD-10-CM

## 2021-06-03 DIAGNOSIS — E785 Hyperlipidemia, unspecified: Secondary | ICD-10-CM | POA: Diagnosis not present

## 2021-06-03 DIAGNOSIS — Z8673 Personal history of transient ischemic attack (TIA), and cerebral infarction without residual deficits: Secondary | ICD-10-CM | POA: Diagnosis not present

## 2021-06-03 DIAGNOSIS — I255 Ischemic cardiomyopathy: Secondary | ICD-10-CM | POA: Insufficient documentation

## 2021-06-03 DIAGNOSIS — I110 Hypertensive heart disease with heart failure: Secondary | ICD-10-CM

## 2021-06-03 DIAGNOSIS — Z7901 Long term (current) use of anticoagulants: Secondary | ICD-10-CM | POA: Diagnosis not present

## 2021-06-03 DIAGNOSIS — I739 Peripheral vascular disease, unspecified: Secondary | ICD-10-CM | POA: Diagnosis not present

## 2021-06-03 DIAGNOSIS — I2510 Atherosclerotic heart disease of native coronary artery without angina pectoris: Secondary | ICD-10-CM

## 2021-06-03 DIAGNOSIS — Z951 Presence of aortocoronary bypass graft: Secondary | ICD-10-CM | POA: Insufficient documentation

## 2021-06-03 DIAGNOSIS — Z8249 Family history of ischemic heart disease and other diseases of the circulatory system: Secondary | ICD-10-CM | POA: Insufficient documentation

## 2021-06-03 DIAGNOSIS — I480 Paroxysmal atrial fibrillation: Secondary | ICD-10-CM | POA: Diagnosis not present

## 2021-06-03 DIAGNOSIS — I4891 Unspecified atrial fibrillation: Secondary | ICD-10-CM | POA: Diagnosis not present

## 2021-06-03 DIAGNOSIS — Z79899 Other long term (current) drug therapy: Secondary | ICD-10-CM | POA: Diagnosis not present

## 2021-06-03 DIAGNOSIS — I509 Heart failure, unspecified: Secondary | ICD-10-CM | POA: Diagnosis not present

## 2021-06-03 HISTORY — PX: CARDIOVERSION: SHX1299

## 2021-06-03 SURGERY — CARDIOVERSION
Anesthesia: General

## 2021-06-03 MED ORDER — PHENYLEPHRINE HCL (PRESSORS) 10 MG/ML IV SOLN
INTRAVENOUS | Status: DC | PRN
  Administered 2021-06-03 (×5): 80 ug via INTRAVENOUS

## 2021-06-03 MED ORDER — PROPOFOL 10 MG/ML IV BOLUS
INTRAVENOUS | Status: DC | PRN
  Administered 2021-06-03: 50 mg via INTRAVENOUS

## 2021-06-03 MED ORDER — SODIUM CHLORIDE 0.9 % IV SOLN: INTRAVENOUS | Status: DC

## 2021-06-03 MED ORDER — LIDOCAINE 2% (20 MG/ML) 5 ML SYRINGE
INTRAMUSCULAR | Status: DC | PRN
  Administered 2021-06-03: 20 mg via INTRAVENOUS

## 2021-06-03 NOTE — CV Procedure (Addendum)
Procedure:   DCCV ? ?Indication:  Symptomatic atrial flutter ? ?Procedure Note:  The patient signed informed consent.  They have had had therapeutic anticoagulation with Eliquis greater than 3 weeks.  Anesthesia was administered by Dr. Ola Spurr and Leane Platt, CRNA.  Adequate airway was maintained throughout and vital followed per protocol.  They were cardioverted x 1 with 100J of biphasic synchronized energy.  They converted to NSR in 60s.  Hypotensive to SBP 60s following cardioversion, required phenylephrine injection with improvement, was 94/59 on discharge.  The patient had normal neuro status and respiratory status post procedure with vitals stable as recorded elsewhere.   ? ?Follow up:  They will continue on current medical therapy and follow up with cardiology as scheduled. ? ?Oswaldo Milian, MD ?06/03/2021 ?10:19 AM  ? ?

## 2021-06-03 NOTE — Anesthesia Preprocedure Evaluation (Signed)
Anesthesia Evaluation  ?Patient identified by MRN, date of birth, ID band ?Patient awake ? ? ? ?Reviewed: ?Allergy & Precautions, NPO status , Patient's Chart, lab work & pertinent test results ? ?Airway ?Mallampati: II ? ?TM Distance: >3 FB ?Neck ROM: Full ? ? ? Dental ? ?(+) Dental Advisory Given ?  ?Pulmonary ?asthma ,  ?  ?breath sounds clear to auscultation ? ? ? ? ? ? Cardiovascular ?hypertension, Pt. on home beta blockers and Pt. on medications ?+ CAD, + Cardiac Stents, + CABG, + Peripheral Vascular Disease and +CHF  ?+ dysrhythmias Atrial Fibrillation  ?Rhythm:Irregular Rate:Normal ? ?EF 45-50%. Mild MR, Mod TR. ?  ?Neuro/Psych ?CVA   ? GI/Hepatic ?negative GI ROS, Neg liver ROS,   ?Endo/Other  ?negative endocrine ROS ? Renal/GU ?Renal disease  ? ?  ?Musculoskeletal ? ?(+) Arthritis ,  ? Abdominal ?  ?Peds ? Hematology ?negative hematology ROS ?(+)   ?Anesthesia Other Findings ? ? Reproductive/Obstetrics ? ?  ? ? ? ? ? ? ? ? ? ? ? ? ? ?  ?  ? ? ? ? ? ? ? ? ?Anesthesia Physical ?Anesthesia Plan ? ?ASA: 3 ? ?Anesthesia Plan: General  ? ?Post-op Pain Management: Minimal or no pain anticipated  ? ?Induction: Intravenous ? ?PONV Risk Score and Plan: Treatment may vary due to age or medical condition ? ?Airway Management Planned: Natural Airway and Mask ? ?Additional Equipment:  ? ?Intra-op Plan:  ? ?Post-operative Plan:  ? ?Informed Consent: I have reviewed the patients History and Physical, chart, labs and discussed the procedure including the risks, benefits and alternatives for the proposed anesthesia with the patient or authorized representative who has indicated his/her understanding and acceptance.  ? ? ? ? ? ?Plan Discussed with:  ? ?Anesthesia Plan Comments:   ? ? ? ? ? ? ?Anesthesia Quick Evaluation ? ?

## 2021-06-03 NOTE — Transfer of Care (Signed)
Immediate Anesthesia Transfer of Care Note ? ?Patient: Donald [name] ? ?Procedure(s) Performed: CARDIOVERSION ? ?Patient Location: Endoscopy Unit ? ?Anesthesia Type:General ? ?Level of Consciousness: awake, drowsy and patient cooperative ? ?Airway & Oxygen Therapy: Patient Spontanous Breathing ? ?Post-op Assessment: Report given to RN, Post -op Vital signs reviewed and stable and Patient moving all extremities X 4 ? ?Post vital signs: Reviewed and stable ? ?Last Vitals:  ?Vitals Value Taken Time  ?BP    ?Temp    ?Pulse    ?Resp    ?SpO2    ? ? ?Last Pain:  ?Vitals:  ? 06/03/21 0909  ?TempSrc: Temporal  ?PainSc: 0-No pain  ?   ? ?  ? ?Complications: No notable events documented. ?

## 2021-06-03 NOTE — Interval H&P Note (Signed)
History and Physical Interval Note: ? ?06/03/2021 ?9:50 AM ? ?Donald [name]  has presented today for surgery, with the diagnosis of AFIB.  The various methods of treatment have been discussed with the patient and family. After consideration of risks, benefits and other options for treatment, the patient has consented to  Procedure(s): ?CARDIOVERSION (N/A) as a surgical intervention.  The patient's history has been reviewed, patient examined, no change in status, stable for surgery.  I have reviewed the patient's chart and labs.  Questions were answered to the patient's satisfaction.   ? ? ?[name] ? ? ?

## 2021-06-03 NOTE — Discharge Instructions (Signed)

## 2021-06-03 NOTE — Anesthesia Postprocedure Evaluation (Signed)
Anesthesia Post Note ? ?Patient: Donald [name] ? ?Procedure(s) Performed: CARDIOVERSION ? ?  ? ?Patient location during evaluation: PACU ?Anesthesia Type: General ?Level of consciousness: awake and alert ?Pain management: pain level controlled ?Vital Signs Assessment: post-procedure vital signs reviewed and stable ?Respiratory status: spontaneous breathing, nonlabored ventilation, respiratory function stable and patient connected to nasal cannula oxygen ?Cardiovascular status: blood pressure returned to baseline and stable ?Postop Assessment: no apparent nausea or vomiting ?Anesthetic complications: no ? ? ?No notable events documented. ? ?Last Vitals:  ?Vitals:  ? 06/03/21 1038 06/03/21 1042  ?BP: (!) 94/59 (!) 95/58  ?Pulse: 64 62  ?Resp: (!) 21 12  ?Temp:    ?SpO2: 97% 98%  ?  ?Last Pain:  ?Vitals:  ? 06/03/21 1042  ?TempSrc:   ?PainSc: 0-No pain  ? ? ?  ?  ?  ?  ?  ?  ? ?Suzette Battiest E ? ? ? ? ?

## 2021-06-04 ENCOUNTER — Encounter (HOSPITAL_COMMUNITY): Payer: Self-pay | Admitting: Cardiology

## 2021-06-05 DIAGNOSIS — L819 Disorder of pigmentation, unspecified: Secondary | ICD-10-CM | POA: Diagnosis not present

## 2021-06-05 DIAGNOSIS — Z08 Encounter for follow-up examination after completed treatment for malignant neoplasm: Secondary | ICD-10-CM | POA: Diagnosis not present

## 2021-06-05 DIAGNOSIS — Z85828 Personal history of other malignant neoplasm of skin: Secondary | ICD-10-CM | POA: Diagnosis not present

## 2021-06-05 DIAGNOSIS — L814 Other melanin hyperpigmentation: Secondary | ICD-10-CM | POA: Diagnosis not present

## 2021-06-05 DIAGNOSIS — D225 Melanocytic nevi of trunk: Secondary | ICD-10-CM | POA: Diagnosis not present

## 2021-06-05 DIAGNOSIS — L578 Other skin changes due to chronic exposure to nonionizing radiation: Secondary | ICD-10-CM | POA: Diagnosis not present

## 2021-06-05 DIAGNOSIS — L821 Other seborrheic keratosis: Secondary | ICD-10-CM | POA: Diagnosis not present

## 2021-06-05 DIAGNOSIS — L570 Actinic keratosis: Secondary | ICD-10-CM | POA: Diagnosis not present

## 2021-07-09 DIAGNOSIS — Z8546 Personal history of malignant neoplasm of prostate: Secondary | ICD-10-CM | POA: Diagnosis not present

## 2021-07-09 DIAGNOSIS — R351 Nocturia: Secondary | ICD-10-CM | POA: Diagnosis not present

## 2021-07-09 DIAGNOSIS — N200 Calculus of kidney: Secondary | ICD-10-CM | POA: Diagnosis not present

## 2021-07-09 DIAGNOSIS — N401 Enlarged prostate with lower urinary tract symptoms: Secondary | ICD-10-CM | POA: Diagnosis not present

## 2021-07-12 ENCOUNTER — Encounter: Payer: Self-pay | Admitting: Cardiology

## 2021-07-12 DIAGNOSIS — I480 Paroxysmal atrial fibrillation: Secondary | ICD-10-CM

## 2021-07-14 DIAGNOSIS — Z20822 Contact with and (suspected) exposure to covid-19: Secondary | ICD-10-CM | POA: Diagnosis not present

## 2021-07-14 MED ORDER — METOPROLOL SUCCINATE ER 25 MG PO TB24: 12.5000 mg | ORAL_TABLET | Freq: Every day | ORAL | 8 refills | Status: DC

## 2021-07-14 MED ORDER — APIXABAN 5 MG PO TABS: 5.0000 mg | ORAL_TABLET | Freq: Two times a day (BID) | ORAL | 0 refills | Status: DC

## 2021-07-15 ENCOUNTER — Encounter: Payer: Self-pay | Admitting: *Deleted

## 2021-07-15 ENCOUNTER — Other Ambulatory Visit: Payer: Self-pay | Admitting: Cardiology

## 2021-07-15 DIAGNOSIS — I480 Paroxysmal atrial fibrillation: Secondary | ICD-10-CM

## 2021-07-17 NOTE — Telephone Encounter (Signed)
Spoke with pt and he states cost of Eliquis is now getting up to $700+ because insurance changed it to Tier 4.  Inquired if they mentioned if they would cover Xarelto and he said they did not say what they would cover.  They are going to look online at the insurance site to see about Xarelto coverage.  Advised I will send to Dr. [name] for review and advisement on switching medications. ?

## 2021-07-18 MED ORDER — RIVAROXABAN 15 MG PO TABS: 15.0000 mg | ORAL_TABLET | Freq: Every day | ORAL | 5 refills | Status: DC

## 2021-07-18 NOTE — Addendum Note (Signed)
Addended by: Ricci Barker on: 07/18/2021 05:27 PM ? ? Modules accepted: Orders ? ?

## 2021-07-24 NOTE — Progress Notes (Signed)
? ? ? ? ?HPI: FU CAD, CHF and atrial fibrillation. He is status post coronary bypassing graft in 1990; also with h/o ischemic cardiomyopathy, prior stroke and prior mural apical thrombus. Prior abdominal ultrasound in May 2006 showed no aneurysm. Carotid Dopplers in November of 2005 showed 0-39% bilaterally.  LHC 09/24/11: LM 30, oLAD 100%, dLAD fills via RV branch off RCA, IM diffuse 95% subtotal disease, mCFX 40%, OM1 normal, RCA normal with large RV branch supplying dLAD, PLA possibly occluded and most dRCA appears to fill from LIMA collats; LIMA patent but does not supply dLAD (connects via small vessel to ? DRCA/PLA, S-IM occluded). Circulation felt to be stable. Continue med Rx recommended. Nuclear study 2018 showed ejection fraction 46%. There was infarct but no ischemia.  Patient had a fall in April 2019 and suffered a small subdural hematoma.  He was ultimately also diagnosed with right pelvic fracture treated conservatively. CTA showed occlusion of the right popliteal artery of indeterminate chronicity; followed by vascular surgery.  Patient also with hypotension/orthostasis. Patient had successful cardioversion on November 23, 2017. Amiodarone decreased to 100 mg daily October 2019 as patient complained of dizziness, tremors, and constipation. Follow-up echocardiogram 6/22 showed ejection fraction 45 to 50%, moderate RVE, moderate RV dysfunction, mild LAE, severe RAE, mild MR, moderate TR, mild AS.  Had elective cardioversion of atrial flutter June 03, 2021.  Since last seen, the patient has dyspnea with more extreme activities but not with routine activities. It is relieved with rest. It is not associated with chest pain. There is no orthopnea, PND or pedal edema. There is no syncope or palpitations. There is no exertional chest pain. ? ? ?Current Outpatient Medications  ?Medication Sig Dispense Refill  ? alendronate (FOSAMAX) 70 MG tablet Take 70 mg by mouth every Sunday. Take with a full glass of water  on an empty stomach.    ? amiodarone (PACERONE) 200 MG tablet Take 0.5 tablets (100 mg total) by mouth daily. 45 tablet 3  ? Cholecalciferol (VITAMIN D) 2000 UNITS tablet Take 2,000 Units by mouth daily.    ? fluticasone furoate-vilanterol (BREO ELLIPTA) 100-25 MCG/INH AEPB Inhale 1 puff into the lungs daily as needed (asthma).    ? furosemide (LASIX) 40 MG tablet Take 2 tablets (80 mg total) by mouth 2 (two) times daily. (Patient taking differently: Take 40 mg by mouth 2 (two) times daily. Based on weight) 360 tablet 0  ? latanoprost (XALATAN) 0.005 % ophthalmic solution Place 1 drop into the left eye at bedtime.    ? levothyroxine (SYNTHROID) 25 MCG tablet TAKE 1 TABLET DAILY BEFORE BREAKFAST 90 tablet 2  ? loratadine (CLARITIN) 10 MG tablet Take 10 mg by mouth daily.    ? metoprolol succinate (TOPROL XL) 25 MG 24 hr tablet Take 0.5 tablets (12.5 mg total) by mouth daily. 45 tablet 3  ? Multiple Vitamin (MULTIVITAMIN WITH MINERALS) TABS tablet Take 1 tablet by mouth daily.    ? pravastatin (PRAVACHOL) 40 MG tablet TAKE 1 TABLET EVERY EVENING 90 tablet 3  ? Rivaroxaban (XARELTO) 15 MG TABS tablet Take 1 tablet (15 mg total) by mouth daily with supper. 30 tablet 5  ? tamsulosin (FLOMAX) 0.4 MG CAPS capsule Take 0.4 mg by mouth every evening.    ? ?No current facility-administered medications for this visit.  ? ? ? ?Past Medical History:  ?Diagnosis Date  ? Asthma   ? with worsening with beta blockade  ? Atrial fibrillation ([place])   ? CAD (coronary artery  disease)   ? CVA (cerebral infarction) 2012  ? tia, mild no residual defecits  ? GI bleed 8 yrs ago  ? due to doll fully vessel which was clipped  ? Glaucoma   ? excellent cataracts  ? History of kidney stones   ? Ischemic cardiomyopathy   ? Moderate to severe mitral regurgitation 06/16/2017  ? Nephrolithiasis   ? Post-infarction apical thrombus ([place])   ? Prostate cancer [facility]) 2012  ? Radiation proctitis Feb 2015  ? treated with APC  ? Tubular adenoma 04/2013  ? Dr.  Hilarie Fredrickson  ? ? ?Past Surgical History:  ?Procedure Laterality Date  ? cardiac stents  2003  ? CARDIOVERSION N/A 06/15/2017  ? Procedure: CARDIOVERSION;  Surgeon: Thayer Headings, MD;  Location: [place];  Service: Cardiovascular;  Laterality: N/A;  ? CARDIOVERSION N/A 07/15/2017  ? Procedure: CARDIOVERSION;  Surgeon: Jerline Pain, MD;  Location: [facility] ENDOSCOPY;  Service: Cardiovascular;  Laterality: N/A;  ? CARDIOVERSION N/A 07/20/2017  ? Procedure: CARDIOVERSION;  Surgeon: Herminio Commons, MD;  Location: [facility] ENDOSCOPY;  Service: Cardiovascular;  Laterality: N/A;  ? CARDIOVERSION N/A 11/23/2017  ? Procedure: CARDIOVERSION;  Surgeon: Lelon Perla, MD;  Location: [place];  Service: Cardiovascular;  Laterality: N/A;  ? CARDIOVERSION N/A 06/03/2021  ? Procedure: CARDIOVERSION;  Surgeon: [name], MD;  Location: [place];  Service: Cardiovascular;  Laterality: N/A;  ? COLONOSCOPY WITH PROPOFOL N/A 05/05/2013  ? Procedure: COLONOSCOPY WITH PROPOFOL;  Surgeon: Jerene Bears, MD;  Location: [facility] ENDOSCOPY;  Service: Gastroenterology;  Laterality: N/A;  ? CORONARY ARTERY BYPASS GRAFT  1998  ? LIMA to the LAD and saphenous vein graft tothe diagonal  ? EXTRACORPOREAL SHOCK WAVE LITHOTRIPSY Right 04/29/2017  ? Procedure: RIGHT EXTRACORPOREAL SHOCK WAVE LITHOTRIPSY (ESWL);  Surgeon: Alexis Frock, MD;  Location: [facility];  Service: Urology;  Laterality: Right;  ? history of radiation treatment  2012  ? x 40 treatments done  ? KNEE ARTHROSCOPY  2016 or 2017  ? Dr Gladstone Lighter ;   ? PARTIAL KNEE ARTHROPLASTY Right 01/13/2017  ? Procedure: UNICOMPARTMENTAL RIGHT KNEE;  Surgeon: Gaynelle Arabian, MD;  Location: [facility];  Service: Orthopedics;  Laterality: Right;  with block  ? ? ?Social History  ? ?Socioeconomic History  ? Marital status: Widowed  ?  Spouse name: Not on file  ? Number of children: 3  ? Years of education: Not on file  ? Highest education level: Not on file  ?Occupational History  ? Occupation: Retired  Conservation officer, [employer]  ?Tobacco Use  ? Smoking status: Never  ? Smokeless tobacco: Never  ?Vaping Use  ? Vaping Use: Never used  ?Substance and Sexual Activity  ? Alcohol use: Yes  ?  Alcohol/week: 0.0 standard drinks  ?  Comment: occasional beer or wine  ? Drug use: No  ? Sexual activity: Not Currently  ?  Birth control/protection: Abstinence  ?Other Topics Concern  ? Not on file  ?Social History Narrative  ? Not on file  ? ?Social Determinants of Health  ? ?Financial Resource Strain: Not on file  ?Food Insecurity: Not on file  ?Transportation Needs: Not on file  ?Physical Activity: Not on file  ?Stress: Not on file  ?Social Connections: Not on file  ?Intimate Partner Violence: Not on file  ? ? ?Family History  ?Problem Relation Age of Onset  ? Heart attack Father   ? Heart disease Mother   ? Stomach cancer Mother   ? ? ?ROS: no fevers  or chills, productive cough, hemoptysis, dysphasia, odynophagia, melena, hematochezia, dysuria, hematuria, rash, seizure activity, orthopnea, PND, pedal edema, claudication. Remaining systems are negative. ? ?Physical Exam: ?Well-developed well-nourished in no acute distress.  ?Skin is warm and dry.  ?HEENT is normal.  ?Neck is supple.  ?Chest is clear to auscultation with normal expansion.  ?Cardiovascular exam is regular rate and rhythm.  ?Abdominal exam nontender or distended. No masses palpated. ?Extremities show no edema. ?neuro grossly intact ? ?ECG-normal sinus rhythm at a rate of 66, right bundle branch block, inferior infarct.  Personally reviewed ? ?A/P ? ?1 paroxysmal atrial fibrillation/flutter-patient remains in sinus rhythm status post recent cardioversion.  Continue amiodarone and Toprol.  Continue xarelto. ? ?2 coronary artery disease-patient doing well with no chest pain.  Continue statin.  No aspirin given need for apixaban. ? ?3 chronic combined systolic/diastolic congestive heart failure-continue diuretic at present dose.  I am not added Farxiga due to history of  orthostasis. ? ?4 ischemic cardiomyopathy-continue beta-blocker.  No ARB or ACE inhibitor given history of orthostatic hypotension. ? ?5 history of apical thrombus-continue apixaban. ? ?6 hyperlipidemia-co

## 2021-07-27 DIAGNOSIS — Z20822 Contact with and (suspected) exposure to covid-19: Secondary | ICD-10-CM | POA: Diagnosis not present

## 2021-08-06 ENCOUNTER — Ambulatory Visit (INDEPENDENT_AMBULATORY_CARE_PROVIDER_SITE_OTHER): Payer: Medicare Other | Admitting: Cardiology

## 2021-08-06 ENCOUNTER — Encounter: Payer: Self-pay | Admitting: Cardiology

## 2021-08-06 VITALS — BP 108/64 | HR 66 | Ht 70.0 in | Wt 171.0 lb

## 2021-08-06 DIAGNOSIS — I480 Paroxysmal atrial fibrillation: Secondary | ICD-10-CM

## 2021-08-06 DIAGNOSIS — I2510 Atherosclerotic heart disease of native coronary artery without angina pectoris: Secondary | ICD-10-CM

## 2021-08-06 DIAGNOSIS — I5022 Chronic systolic (congestive) heart failure: Secondary | ICD-10-CM | POA: Diagnosis not present

## 2021-08-06 DIAGNOSIS — E7800 Pure hypercholesterolemia, unspecified: Secondary | ICD-10-CM | POA: Diagnosis not present

## 2021-08-06 DIAGNOSIS — I255 Ischemic cardiomyopathy: Secondary | ICD-10-CM | POA: Diagnosis not present

## 2021-08-06 NOTE — Patient Instructions (Signed)

## 2021-08-07 DIAGNOSIS — L570 Actinic keratosis: Secondary | ICD-10-CM | POA: Diagnosis not present

## 2021-08-09 ENCOUNTER — Encounter: Payer: Self-pay | Admitting: Cardiology

## 2021-08-09 DIAGNOSIS — I480 Paroxysmal atrial fibrillation: Secondary | ICD-10-CM

## 2021-08-11 MED ORDER — RIVAROXABAN 15 MG PO TABS: 15.0000 mg | ORAL_TABLET | Freq: Every day | ORAL | 3 refills | Status: DC

## 2021-08-11 MED ORDER — AMIODARONE HCL 200 MG PO TABS: 100.0000 mg | ORAL_TABLET | Freq: Every day | ORAL | 3 refills | Status: DC

## 2021-08-13 ENCOUNTER — Encounter: Payer: Self-pay | Admitting: Cardiology

## 2021-08-13 DIAGNOSIS — I480 Paroxysmal atrial fibrillation: Secondary | ICD-10-CM

## 2021-08-13 MED ORDER — METOPROLOL SUCCINATE ER 25 MG PO TB24: 12.5000 mg | ORAL_TABLET | Freq: Every day | ORAL | 3 refills | Status: DC

## 2021-08-13 NOTE — Telephone Encounter (Signed)
Tried to call pt, left message to call back ?

## 2021-08-13 NOTE — Telephone Encounter (Signed)
Returned call to pt he states that he has moved and states that he has not been able to get a refill for his metoprolol. He states that he lost this medication in his move and the pharmacy will not refill because it is a refill too soon. He states that he has not taken metoprolol for at least a mont, and he states that he feels "much better not taking". Informed pt that he should I have sent a refill. He states that he will start again tomorrow.  ?His last BP that he took was on 06-10-21 158/100 HR 56 he states that he does not remember if this was taking the metoprolol or not. He will take his BP/HR 1 hour after taking medication. He will send [place] a list of these BP Monday for review after restarting metoprolol tomorrow. ?Spoke with Nali at the pharmacy she states that pt has not called this pharmacy or any local [facility] for refill she states that she states that this is "in the que" to be filled now. They will contact pt. ?

## 2021-09-02 NOTE — Patient Outreach (Signed)
[place] [place] [facility]) Care Management  09/02/2021  Donald [name] 1926-07-22 [id number]   Received referral for Care Management from Insurance plan. Assigned patient to Deloria Lair, RN Care Coordinator for follow up.   [place] Management Assistant [phone]

## 2021-09-19 DIAGNOSIS — Q6689 Other  specified congenital deformities of feet: Secondary | ICD-10-CM | POA: Diagnosis not present

## 2021-09-19 DIAGNOSIS — B351 Tinea unguium: Secondary | ICD-10-CM | POA: Diagnosis not present

## 2021-09-24 ENCOUNTER — Other Ambulatory Visit: Payer: Self-pay | Admitting: *Deleted

## 2021-09-24 NOTE — Patient Outreach (Signed)
[facility] [facility]) Care Management  09/24/2021  Donald [name] 1926/12/04 [id number]  Initial telephone outreach for [place] Management services. Talked with Donald [name] and told him about our services. He says he does not feel he is in need at this time. He says he is well tended to by his primary and cardiac doctors.  NP will send successful letter.  Eulah Pont. Myrtie Neither, MSN, [facility] Gerontological Nurse Practitioner [facility] Care Management [phone]

## 2021-11-01 ENCOUNTER — Other Ambulatory Visit: Payer: Self-pay | Admitting: Cardiology

## 2021-11-10 DIAGNOSIS — H52203 Unspecified astigmatism, bilateral: Secondary | ICD-10-CM | POA: Diagnosis not present

## 2021-11-10 DIAGNOSIS — Z961 Presence of intraocular lens: Secondary | ICD-10-CM | POA: Diagnosis not present

## 2021-11-10 DIAGNOSIS — H353132 Nonexudative age-related macular degeneration, bilateral, intermediate dry stage: Secondary | ICD-10-CM | POA: Diagnosis not present

## 2021-11-10 DIAGNOSIS — H4720 Unspecified optic atrophy: Secondary | ICD-10-CM | POA: Diagnosis not present

## 2021-11-19 DIAGNOSIS — I110 Hypertensive heart disease with heart failure: Secondary | ICD-10-CM | POA: Diagnosis not present

## 2021-11-19 DIAGNOSIS — E039 Hypothyroidism, unspecified: Secondary | ICD-10-CM | POA: Diagnosis not present

## 2021-11-19 DIAGNOSIS — M810 Age-related osteoporosis without current pathological fracture: Secondary | ICD-10-CM | POA: Diagnosis not present

## 2021-11-19 DIAGNOSIS — R7301 Impaired fasting glucose: Secondary | ICD-10-CM | POA: Diagnosis not present

## 2021-11-25 DIAGNOSIS — I679 Cerebrovascular disease, unspecified: Secondary | ICD-10-CM | POA: Diagnosis not present

## 2021-11-25 DIAGNOSIS — N1831 Chronic kidney disease, stage 3a: Secondary | ICD-10-CM | POA: Diagnosis not present

## 2021-11-25 DIAGNOSIS — I509 Heart failure, unspecified: Secondary | ICD-10-CM | POA: Diagnosis not present

## 2021-11-25 DIAGNOSIS — Z1331 Encounter for screening for depression: Secondary | ICD-10-CM | POA: Diagnosis not present

## 2021-11-25 DIAGNOSIS — I130 Hypertensive heart and chronic kidney disease with heart failure and stage 1 through stage 4 chronic kidney disease, or unspecified chronic kidney disease: Secondary | ICD-10-CM | POA: Diagnosis not present

## 2021-11-25 DIAGNOSIS — M810 Age-related osteoporosis without current pathological fracture: Secondary | ICD-10-CM | POA: Diagnosis not present

## 2021-11-25 DIAGNOSIS — R7301 Impaired fasting glucose: Secondary | ICD-10-CM | POA: Diagnosis not present

## 2021-11-25 DIAGNOSIS — E039 Hypothyroidism, unspecified: Secondary | ICD-10-CM | POA: Diagnosis not present

## 2021-11-25 DIAGNOSIS — Z8546 Personal history of malignant neoplasm of prostate: Secondary | ICD-10-CM | POA: Diagnosis not present

## 2021-11-25 DIAGNOSIS — R82998 Other abnormal findings in urine: Secondary | ICD-10-CM | POA: Diagnosis not present

## 2021-11-25 DIAGNOSIS — I480 Paroxysmal atrial fibrillation: Secondary | ICD-10-CM | POA: Diagnosis not present

## 2021-11-25 DIAGNOSIS — J449 Chronic obstructive pulmonary disease, unspecified: Secondary | ICD-10-CM | POA: Diagnosis not present

## 2021-11-25 DIAGNOSIS — Z1339 Encounter for screening examination for other mental health and behavioral disorders: Secondary | ICD-10-CM | POA: Diagnosis not present

## 2021-11-25 DIAGNOSIS — Z0000 Encounter for general adult medical examination without abnormal findings: Secondary | ICD-10-CM | POA: Diagnosis not present

## 2021-11-25 DIAGNOSIS — Z7901 Long term (current) use of anticoagulants: Secondary | ICD-10-CM | POA: Diagnosis not present

## 2021-12-04 DIAGNOSIS — D492 Neoplasm of unspecified behavior of bone, soft tissue, and skin: Secondary | ICD-10-CM | POA: Diagnosis not present

## 2021-12-04 DIAGNOSIS — D485 Neoplasm of uncertain behavior of skin: Secondary | ICD-10-CM | POA: Diagnosis not present

## 2021-12-04 DIAGNOSIS — C44329 Squamous cell carcinoma of skin of other parts of face: Secondary | ICD-10-CM | POA: Diagnosis not present

## 2021-12-18 DIAGNOSIS — Z23 Encounter for immunization: Secondary | ICD-10-CM | POA: Diagnosis not present

## 2022-01-14 NOTE — Progress Notes (Signed)
HPI: FU CAD, CHF and atrial fibrillation. He is status post coronary bypassing graft in 1990; also with h/o ischemic cardiomyopathy, prior stroke and prior mural apical thrombus. Prior abdominal ultrasound in May 2006 showed no aneurysm. Carotid Dopplers in November of 2005 showed 0-39% bilaterally.  LHC 09/24/11: LM 30, oLAD 100%, dLAD fills via RV branch off RCA, IM diffuse 95% subtotal disease, mCFX 40%, OM1 normal, RCA normal with large RV branch supplying dLAD, PLA possibly occluded and most dRCA appears to fill from LIMA collats; LIMA patent but does not supply dLAD (connects via small vessel to ? DRCA/PLA, S-IM occluded). Circulation felt to be stable. Continue med Rx recommended. Nuclear study 2018 showed ejection fraction 46%. There was infarct but no ischemia.  Patient had a fall in April 2019 and suffered a small subdural hematoma.  He was ultimately also diagnosed with right pelvic fracture treated conservatively. CTA showed occlusion of the right popliteal artery of indeterminate chronicity; followed by vascular surgery.  Patient also with hypotension/orthostasis. Patient had successful cardioversion on November 23, 2017. Amiodarone decreased to 100 mg daily October 2019 as patient complained of dizziness, tremors, and constipation. Follow-up echocardiogram 6/22 showed ejection fraction 45 to 50%, moderate RVE, moderate RV dysfunction, mild LAE, severe RAE, mild MR, moderate TR, mild AS.  Had elective cardioversion of atrial flutter June 03, 2021.  Since last seen, he denies dyspnea, chest pain, palpitations or syncope.  Current Outpatient Medications  Medication Sig Dispense Refill   alendronate (FOSAMAX) 70 MG tablet Take 70 mg by mouth every Sunday. Take with a full glass of water on an empty stomach.     Cholecalciferol (VITAMIN D) 2000 UNITS tablet Take 2,000 Units by mouth daily.     fluticasone furoate-vilanterol (BREO ELLIPTA) 100-25 MCG/INH AEPB Inhale 1 puff into the lungs daily as  needed (asthma).     furosemide (LASIX) 40 MG tablet TAKE 2 TABLETS ('80MG'$  TOTAL)TWO TIMES A DAY 360 tablet 0   latanoprost (XALATAN) 0.005 % ophthalmic solution Place 1 drop into the left eye at bedtime.     levothyroxine (SYNTHROID) 25 MCG tablet TAKE 1 TABLET DAILY BEFORE BREAKFAST 90 tablet 2   loratadine (CLARITIN) 10 MG tablet Take 10 mg by mouth daily.     Multiple Vitamin (MULTIVITAMIN WITH MINERALS) TABS tablet Take 1 tablet by mouth daily.     pravastatin (PRAVACHOL) 40 MG tablet TAKE 1 TABLET EVERY EVENING 90 tablet 3   Rivaroxaban (XARELTO) 15 MG TABS tablet Take 1 tablet (15 mg total) by mouth daily with supper. 90 tablet 3   tamsulosin (FLOMAX) 0.4 MG CAPS capsule Take 0.4 mg by mouth every evening.     No current facility-administered medications for this visit.     Past Medical History:  Diagnosis Date   Asthma    with worsening with beta blockade   Atrial fibrillation ([facility])    CAD (coronary artery disease)    CVA (cerebral infarction) 2012   tia, mild no residual defecits   GI bleed 8 yrs ago   due to doll fully vessel which was clipped   Glaucoma    excellent cataracts   History of kidney stones    Ischemic cardiomyopathy    Moderate to severe mitral regurgitation 06/16/2017   Nephrolithiasis    Post-infarction apical thrombus [facility])    Prostate cancer [facility]) 2012   Radiation proctitis Feb 2015   treated with APC   Tubular adenoma 04/2013   Dr. Hilarie Fredrickson  Past Surgical History:  Procedure Laterality Date   cardiac stents  2003   CARDIOVERSION N/A 06/15/2017   Procedure: CARDIOVERSION;  Surgeon: Acie Fredrickson, Wonda Cheng, MD;  Location: [place];  Service: Cardiovascular;  Laterality: N/A;   CARDIOVERSION N/A 07/15/2017   Procedure: CARDIOVERSION;  Surgeon: Jerline Pain, MD;  Location: [facility] ENDOSCOPY;  Service: Cardiovascular;  Laterality: N/A;   CARDIOVERSION N/A 07/20/2017   Procedure: CARDIOVERSION;  Surgeon: Herminio Commons, MD;  Location: [facility] ENDOSCOPY;   Service: Cardiovascular;  Laterality: N/A;   CARDIOVERSION N/A 11/23/2017   Procedure: CARDIOVERSION;  Surgeon: Lelon Perla, MD;  Location: [facility] ENDOSCOPY;  Service: Cardiovascular;  Laterality: N/A;   CARDIOVERSION N/A 06/03/2021   Procedure: CARDIOVERSION;  Surgeon: [name], MD;  Location: [place];  Service: Cardiovascular;  Laterality: N/A;   COLONOSCOPY WITH PROPOFOL N/A 05/05/2013   Procedure: COLONOSCOPY WITH PROPOFOL;  Surgeon: Jerene Bears, MD;  [facility] ENDOS[facility];  Service: Gastroenterology;  Laterality: N/A;   CORONARY ARTERY BYPASS GRAFT  1998   LIMA to the LAD and saphenous vein graft tothe diagonal   EXTRACORPOREAL SHOCK WAVE LITHOTRIPSY Right 04/29/2017   Procedure: RIGHT EXTRACORPOREAL SHOCK WAVE LITHOTRIPSY (ESWL);  Surgeon: Alexis Frock, MD;  Location: [facility];  Service: Urology;  Laterality: Right;   history of radiation treatment  2012   x 40 treatments done   KNEE ARTHROSCOPY  2016 or 2017   Dr Gladstone Lighter ;    PARTIAL KNEE ARTHROPLASTY Right 01/13/2017   Procedure: UNICOMPARTMENTAL RIGHT KNEE;  Surgeon: Gaynelle Arabian, MD;  Location: [facility];  Service: Orthopedics;  Laterality: Right;  with block    Social History   Socioeconomic History   Marital status: Widowed    Spouse name: Not on file   Number of children: 3   Years of education: Not on file   Highest education level: Not on file  Occupational History   Occupation: Retired Conservation officer, [employer]  Tobacco Use   Smoking status: Never   Smokeless tobacco: Never  Vaping Use   Vaping Use: Never used  Substance and Sexual Activity   Alcohol use: Yes    Alcohol/week: 0.0 standard drinks of alcohol    Comment: occasional beer or wine   Drug use: No   Sexual activity: Not Currently    Birth control/protection: Abstinence  Other Topics Concern   Not on file  Social History Narrative   Not on file   Social Determinants of Health   Financial Resource Strain: Not on file  Food Insecurity:  Not on file  Transportation Needs: Not on file  Physical Activity: Not on file  Stress: Not on file  Social Connections: Not on file  Intimate Partner Violence: Not on file    Family History  Problem Relation Age of Onset   Heart attack Father    Heart disease Mother    Stomach cancer Mother     ROS: no fevers or chills, productive cough, hemoptysis, dysphasia, odynophagia, melena, hematochezia, dysuria, hematuria, rash, seizure activity, orthopnea, PND, pedal edema, claudication. Remaining systems are negative.  Physical Exam: Well-developed well-nourished in no acute distress.  Skin is warm and dry.  HEENT is normal.  Neck is supple.  Chest is clear to auscultation with normal expansion.  Cardiovascular exam is regular rate and rhythm.  2/6 systolic murmur left sternal border. Abdominal exam nontender or distended. No masses palpated. Extremities show no edema. neuro grossly intact  ECG-normal sinus rhythm at a rate of 63, right bundle branch block,  inferior infarct.  Personally reviewed  A/P  1 paroxysmal atrial fibrillation/flutter-he is in sinus rhythm today.  We will resume amiodarone at 100 mg daily.  Continue Xarelto.  We will have most recent laboratories forwarded to [place] from primary care.  2 chronic combined systolic/diastolic congestive heart failure-patient is euvolemic on examination.  We will continue diuretic at present dose.    3 coronary artery disease-patient denies chest pain.  Continue statin.  Patient is not on aspirin given need for anticoagulation.  4 hyperlipidemia-continue statin.   5 ischemic cardiomyopathy-He is not on an ARB or Entresto given history of orthostasis.  Beta-blocker also discontinued previously due to borderline blood pressure.  6 apical thrombus-continue Xarelto.  7 history of mild aortic stenosis-plan repeat echocardiogram.  8 peripheral vascular disease-followed by vascular surgery.  Kirk Ruths, MD

## 2022-01-28 ENCOUNTER — Ambulatory Visit: Payer: Medicare Other | Attending: Cardiology | Admitting: Cardiology

## 2022-01-28 ENCOUNTER — Encounter: Payer: Self-pay | Admitting: Cardiology

## 2022-01-28 VITALS — BP 116/68 | HR 63 | Ht 70.0 in | Wt 166.0 lb

## 2022-01-28 DIAGNOSIS — I350 Nonrheumatic aortic (valve) stenosis: Secondary | ICD-10-CM | POA: Diagnosis not present

## 2022-01-28 DIAGNOSIS — I255 Ischemic cardiomyopathy: Secondary | ICD-10-CM | POA: Insufficient documentation

## 2022-01-28 DIAGNOSIS — I2510 Atherosclerotic heart disease of native coronary artery without angina pectoris: Secondary | ICD-10-CM | POA: Insufficient documentation

## 2022-01-28 DIAGNOSIS — E7800 Pure hypercholesterolemia, unspecified: Secondary | ICD-10-CM | POA: Insufficient documentation

## 2022-01-28 DIAGNOSIS — I480 Paroxysmal atrial fibrillation: Secondary | ICD-10-CM | POA: Insufficient documentation

## 2022-01-28 MED ORDER — AMIODARONE HCL 100 MG PO TABS: 100.0000 mg | ORAL_TABLET | Freq: Every day | ORAL | 3 refills | Status: DC

## 2022-01-28 NOTE — Patient Instructions (Signed)
Medication Instructions:   START AMIODARONE 100 MG ONCE DAILY  *If you need a refill on your cardiac medications before your next appointment, please call your pharmacy*  Testing/Procedures:  Your physician has requested that you have an echocardiogram. Echocardiography is a painless test that uses sound waves to create images of your heart. It provides your doctor with information about the size and shape of your heart and how well your heart's chambers and valves are working. This procedure takes approximately one hour. There are no restrictions for this procedure. Please do NOT wear cologne, perfume, aftershave, or lotions (deodorant is allowed). Please arrive 15 minutes prior to your appointment time.  [place] [facility] 1ST FLOOR IMAGING   Follow-Up: At [facility], you and your health needs are our priority.  As part of our continuing mission to provide you with exceptional heart care, we have created designated Provider Care Teams.  These Care Teams include your primary Cardiologist (physician) and Advanced Practice Providers (APPs -  Physician Assistants and Nurse Practitioners) who all work together to provide you with the care you need, when you need it.  We recommend signing up for the patient portal called "MyChart".  Sign up information is provided on this After Visit Summary.  MyChart is used to connect with patients for Virtual Visits (Telemedicine).  Patients are able to view lab/test results, encounter notes, upcoming appointments, etc.  Non-urgent messages can be sent to your provider as well.   To learn more about what you can do with MyChart, go to [email or web address].    Your next appointment:   6 month(s)  The format for your next appointment:   In Person  Provider:   Kirk Ruths, MD

## 2022-01-29 DIAGNOSIS — C44329 Squamous cell carcinoma of skin of other parts of face: Secondary | ICD-10-CM | POA: Diagnosis not present

## 2022-02-02 ENCOUNTER — Encounter: Payer: Self-pay | Admitting: *Deleted

## 2022-02-04 ENCOUNTER — Other Ambulatory Visit (HOSPITAL_BASED_OUTPATIENT_CLINIC_OR_DEPARTMENT_OTHER): Payer: Medicare Other

## 2022-02-11 ENCOUNTER — Ambulatory Visit (HOSPITAL_BASED_OUTPATIENT_CLINIC_OR_DEPARTMENT_OTHER)
Admission: RE | Admit: 2022-02-11 | Discharge: 2022-02-11 | Disposition: A | Discharge status: Home / Self Care | Payer: Medicare Other | Source: Ambulatory Visit | Attending: Cardiology | Admitting: Cardiology

## 2022-02-11 DIAGNOSIS — I255 Ischemic cardiomyopathy: Secondary | ICD-10-CM

## 2022-02-11 DIAGNOSIS — I350 Nonrheumatic aortic (valve) stenosis: Secondary | ICD-10-CM | POA: Diagnosis not present

## 2022-02-11 DIAGNOSIS — R0609 Other forms of dyspnea: Secondary | ICD-10-CM

## 2022-02-11 LAB — ECHOCARDIOGRAM COMPLETE
AR max vel: 2.52 cm2
AV Area VTI: 2.36 cm2
AV Area mean vel: 2.22 cm2
AV Mean grad: 6 mmHg
AV Peak grad: 11.2 mmHg
Ao pk vel: 1.67 m/s
Area-P 1/2: 1.67 cm2
MV M vel: 5.83 m/s
MV Peak grad: 136 mmHg
S' Lateral: 3.7 cm

## 2022-02-11 MED ORDER — PERFLUTREN LIPID MICROSPHERE
1.0000 mL | INTRAVENOUS | Status: AC | PRN
  Administered 2022-02-11: 3 mL via INTRAVENOUS

## 2022-03-09 DIAGNOSIS — D485 Neoplasm of uncertain behavior of skin: Secondary | ICD-10-CM | POA: Diagnosis not present

## 2022-03-09 DIAGNOSIS — C4442 Squamous cell carcinoma of skin of scalp and neck: Secondary | ICD-10-CM | POA: Diagnosis not present

## 2022-03-09 DIAGNOSIS — D492 Neoplasm of unspecified behavior of bone, soft tissue, and skin: Secondary | ICD-10-CM | POA: Diagnosis not present

## 2022-03-31 ENCOUNTER — Telehealth: Payer: Self-pay | Admitting: Cardiology

## 2022-03-31 MED ORDER — APIXABAN 5 MG PO TABS: 5.0000 mg | ORAL_TABLET | Freq: Two times a day (BID) | ORAL | 3 refills | Status: DC

## 2022-03-31 NOTE — Telephone Encounter (Signed)
Pt c/o medication issue:  1. Name of Medication:  Rivaroxaban (XARELTO) 15 MG TABS tablet  eliquis   2. How are you currently taking this medication (dosage and times per day)? Takes xarelto daily  3. Are you having a reaction (difficulty breathing--STAT)? no  4. What is your medication issue? Patient states he was switched from eliquis to xarelto due to the eliquis being more expensive. He says this year the xarelto is more expensive. He would like to know if he can switch back.

## 2022-03-31 NOTE — Telephone Encounter (Signed)
New script sent to the pharmacy

## 2022-03-31 NOTE — Telephone Encounter (Signed)
Spoke with pt, aware okay to change. Aware will forward to dr [name] to confirm dose as there is 2.5 mg and 5 mg  in his chart and then will send in. Pt agreed with this plan.

## 2022-04-02 ENCOUNTER — Encounter: Payer: Self-pay | Admitting: Cardiology

## 2022-04-02 ENCOUNTER — Ambulatory Visit: Payer: Medicare Other | Attending: Cardiology | Admitting: Cardiology

## 2022-04-02 ENCOUNTER — Telehealth: Payer: Self-pay | Admitting: Cardiology

## 2022-04-02 VITALS — BP 136/79 | HR 53 | Ht 69.5 in | Wt 163.5 lb

## 2022-04-02 DIAGNOSIS — I4892 Unspecified atrial flutter: Secondary | ICD-10-CM | POA: Diagnosis not present

## 2022-04-02 DIAGNOSIS — I2510 Atherosclerotic heart disease of native coronary artery without angina pectoris: Secondary | ICD-10-CM

## 2022-04-02 DIAGNOSIS — Z01812 Encounter for preprocedural laboratory examination: Secondary | ICD-10-CM | POA: Diagnosis not present

## 2022-04-02 DIAGNOSIS — I480 Paroxysmal atrial fibrillation: Secondary | ICD-10-CM | POA: Diagnosis not present

## 2022-04-02 DIAGNOSIS — I350 Nonrheumatic aortic (valve) stenosis: Secondary | ICD-10-CM | POA: Diagnosis not present

## 2022-04-02 LAB — CBC WITH DIFFERENTIAL/PLATELET
Basophils Absolute: 0 10*3/uL (ref 0.0–0.2)
Basos: 1 %
EOS (ABSOLUTE): 0.2 10*3/uL (ref 0.0–0.4)
Eos: 3 %
Hematocrit: 43.3 % (ref 37.5–51.0)
Hemoglobin: 14.1 g/dL (ref 13.0–17.7)
Immature Grans (Abs): 0 10*3/uL (ref 0.0–0.1)
Immature Granulocytes: 0 %
Lymphocytes Absolute: 1.5 10*3/uL (ref 0.7–3.1)
Lymphs: 23 %
MCH: 31.6 pg (ref 26.6–33.0)
MCHC: 32.6 g/dL (ref 31.5–35.7)
MCV: 97 fL (ref 79–97)
Monocytes Absolute: 0.4 10*3/uL (ref 0.1–0.9)
Monocytes: 7 %
Neutrophils Absolute: 4.3 10*3/uL (ref 1.4–7.0)
Neutrophils: 66 %
Platelets: 237 10*3/uL (ref 150–450)
RBC: 4.46 x10E6/uL (ref 4.14–5.80)
RDW: 12.6 % (ref 11.6–15.4)
WBC: 6.4 10*3/uL (ref 3.4–10.8)

## 2022-04-02 LAB — BASIC METABOLIC PANEL
BUN/Creatinine Ratio: 22 (ref 10–24)
BUN: 25 mg/dL (ref 10–36)
CO2: 28 mmol/L (ref 20–29)
Calcium: 9.5 mg/dL (ref 8.6–10.2)
Chloride: 97 mmol/L (ref 96–106)
Creatinine, Ser: 1.15 mg/dL (ref 0.76–1.27)
Glucose: 101 mg/dL — ABNORMAL HIGH (ref 70–99)
Potassium: 4.1 mmol/L (ref 3.5–5.2)
Sodium: 139 mmol/L (ref 134–144)
eGFR: 59 mL/min/{1.73_m2} — ABNORMAL LOW (ref >59)

## 2022-04-02 MED ORDER — RIVAROXABAN 15 MG PO TABS: 15.0000 mg | ORAL_TABLET | Freq: Every day | ORAL | Status: DC

## 2022-04-02 NOTE — Telephone Encounter (Signed)
Pt c/o Syncope: STAT if syncope occurred within 30 minutes and pt complains of lightheadedness High Priority if episode of passing out, completely, today or in last 24 hours   Did you pass out today?  No, but patient mentions that he took vitals when he woke up today--BP 103/70's, HR 120, o2 96  When is the last time you passed out?   Yesterday morning patient became dizzy, walked about 100 yards. Felt tired and SOB so he sat down for a few minutes then walked some more. Had a nurse check his BP and it was similar to today's reading (above). He states he fainted shortly thereafter prior to going to a meeting  Has this occurred multiple times?  No, but patient states he has been dizzier recently  Did you have any symptoms prior to passing out?  Tiredness, SOB

## 2022-04-02 NOTE — H&P (View-Only) (Signed)
Cardiology Office Note:    Date:  04/02/2022   ID:  [name], DOB Sep 09, 1926, MRN [id number]  PCP:  Haywood Pao, MD   [facility] Cardiologist:  Kirk Ruths, MD d    Referring MD: Haywood Pao, MD   Chief Complaint  Patient presents with   Atrial Flutter    History of Present Illness:    JOSHA WEEKLEY is a 89 y.o. male seen as a work in for recurrent atrial flutter. He has a history of CAD s/p CABG in 1990. History of ischemic CM with EF 45% by Echo in November. Prior history of CVA and mural thrombus. On Chronic anticoagulation with Xarelto 15 mg daily. Carotid Dopplers in November of 2005 showed 0-39% bilaterally.  LHC 09/24/11: LM 30, oLAD 100%, dLAD fills via RV branch off RCA, IM diffuse 95% subtotal disease, mCFX 40%, OM1 normal, RCA normal with large RV branch supplying dLAD, PLA possibly occluded and most dRCA appears to fill from LIMA collats; LIMA patent but does not supply dLAD (connects via small vessel to ? DRCA/PLA, S-IM occluded). Circulation felt to be stable. Continue med Rx recommended.   Nuclear study 2018 showed ejection fraction 46%. There was infarct but no ischemia.  Patient had a fall in April 2019 and suffered a small subdural hematoma.  He was ultimately also diagnosed with right pelvic fracture treated conservatively. CTA showed occlusion of the right popliteal artery of indeterminate chronicity; followed by vascular surgery.  Patient also with hypotension/orthostasis.   Patient has known Afib/flutter. Patient had successful cardioversion on November 23, 2017. Amiodarone decreased to 100 mg daily October 2019 as patient complained of dizziness, tremors, and constipation. Follow-up echocardiogram 6/22 showed ejection fraction 45 to 50%, moderate RVE, moderate RV dysfunction, mild LAE, severe RAE, mild MR, moderate TR, mild AS.  Had elective cardioversion of atrial flutter June 03, 2021. Did well up until 2 days ago when noted  significant increase in SOB and dizziness. Yesterday states he passed out for one second. Came in today and found to have recurrent Aflutter rate 114. Has been compliant with anticoagulation.   Denies any chest pain.   Past Medical History:  Diagnosis Date   Asthma    with worsening with beta blockade   Atrial fibrillation (HCC)    CAD (coronary artery disease)    CVA (cerebral infarction) 2012   tia, mild no residual defecits   GI bleed 8 yrs ago   due to doll fully vessel which was clipped   Glaucoma    excellent cataracts   History of kidney stones    Ischemic cardiomyopathy    Moderate to severe mitral regurgitation 06/16/2017   Nephrolithiasis    Post-infarction apical thrombus [facility])    Prostate cancer [facility]) 2012   Radiation proctitis Feb 2015   treated with APC   Tubular adenoma 04/2013   Dr. Hilarie Fredrickson    Past Surgical History:  Procedure Laterality Date   cardiac stents  2003   CARDIOVERSION N/A 06/15/2017   Procedure: CARDIOVERSION;  Surgeon: Acie Fredrickson Wonda Cheng, MD;  Location: [place];  Service: Cardiovascular;  Laterality: N/A;   CARDIOVERSION N/A 07/15/2017   Procedure: CARDIOVERSION;  Surgeon: Jerline Pain, MD;  Location: [facility] ENDOSCOPY;  Service: Cardiovascular;  Laterality: N/A;   CARDIOVERSION N/A 07/20/2017   Procedure: CARDIOVERSION;  Surgeon: Herminio Commons, MD;  Location: [facility] ENDOSCOPY;  Service: Cardiovascular;  Laterality: N/A;   CARDIOVERSION N/A 11/23/2017   Procedure: CARDIOVERSION;  Surgeon:  Lelon Perla, MD;  Location: [place];  Service: Cardiovascular;  Laterality: N/A;   CARDIOVERSION N/A 06/03/2021   Procedure: CARDIOVERSION;  Surgeon: [name], MD;  Location: [place];  Service: Cardiovascular;  Laterality: N/A;   COLONOSCOPY WITH PROPOFOL N/A 05/05/2013   Procedure: COLONOSCOPY WITH PROPOFOL;  Surgeon: Jerene Bears, MD;  Location: [facility] ENDOSCOPY;  Service: Gastroenterology;  Laterality: N/A;   CORONARY ARTERY BYPASS GRAFT   1998   LIMA to the LAD and saphenous vein graft tothe diagonal   EXTRACORPOREAL SHOCK WAVE LITHOTRIPSY Right 04/29/2017   Procedure: RIGHT EXTRACORPOREAL SHOCK WAVE LITHOTRIPSY (ESWL);  Surgeon: Alexis Frock, MD;  Location: [facility];  Service: Urology;  Laterality: Right;   history of radiation treatment  2012   x 40 treatments done   KNEE ARTHROSCOPY  2016 or 2017   Dr Gladstone Lighter ;    PARTIAL KNEE ARTHROPLASTY Right 01/13/2017   Procedure: UNICOMPARTMENTAL RIGHT KNEE;  Surgeon: Gaynelle Arabian, MD;  Location: [facility];  Service: Orthopedics;  Laterality: Right;  with block    Current Medications: Current Meds  Medication Sig   alendronate (FOSAMAX) 70 MG tablet Take 70 mg by mouth every Sunday. Take with a full glass of water on an empty stomach.   amiodarone (PACERONE) 100 MG tablet Take 1 tablet (100 mg total) by mouth daily.   Cholecalciferol (VITAMIN D) 2000 UNITS tablet Take 2,000 Units by mouth daily.   fluticasone furoate-vilanterol (BREO ELLIPTA) 100-25 MCG/INH AEPB Inhale 1 puff into the lungs daily as needed (asthma).   furosemide (LASIX) 40 MG tablet TAKE 2 TABLETS ('80MG'$  TOTAL)TWO TIMES A DAY   latanoprost (XALATAN) 0.005 % ophthalmic solution Place 1 drop into the left eye at bedtime.   levothyroxine (SYNTHROID) 25 MCG tablet TAKE 1 TABLET DAILY BEFORE BREAKFAST   loratadine (CLARITIN) 10 MG tablet Take 10 mg by mouth daily.   Multiple Vitamin (MULTIVITAMIN WITH MINERALS) TABS tablet Take 1 tablet by mouth daily.   pravastatin (PRAVACHOL) 40 MG tablet TAKE 1 TABLET EVERY EVENING   Rivaroxaban (XARELTO) 15 MG TABS tablet Take 1 tablet (15 mg total) by mouth daily with supper.   tamsulosin (FLOMAX) 0.4 MG CAPS capsule Take 0.4 mg by mouth every evening.   [DISCONTINUED] apixaban (ELIQUIS) 5 MG TABS tablet Take 1 tablet (5 mg total) by mouth 2 (two) times daily.     Allergies:   Beta adrenergic blockers, Keflex [cephalexin], and Morphine   Social History   Socioeconomic History    Marital status: Widowed    Spouse name: Not on file   Number of children: 3   Years of education: Not on file   Highest education level: Not on file  Occupational History   Occupation: Retired Conservation officer, nature  Tobacco Use   Smoking status: Never   Smokeless tobacco: Never  Vaping Use   Vaping Use: Never used  Substance and Sexual Activity   Alcohol use: Yes    Alcohol/week: 0.0 standard drinks of alcohol    Comment: occasional beer or wine   Drug use: No   Sexual activity: Not Currently    Birth control/protection: Abstinence  Other Topics Concern   Not on file  Social History Narrative   Not on file   Social Determinants of Health   Financial Resource Strain: Not on file  Food Insecurity: Not on file  Transportation Needs: Not on file  Physical Activity: Not on file  Stress: Not on file  Social Connections: Not on file  Family History: The patient's family history includes Heart attack in his father; Heart disease in his mother; Stomach cancer in his mother.  ROS:   Please see the history of present illness.     All other systems reviewed and are negative.  EKGs/Labs/Other Studies Reviewed:    The following studies were reviewed today: Echo 02/11/22: IMPRESSIONS     1. 3D EF - overestimated. Left ventricular ejection fraction, by  estimation, is 40 to 45%. Left ventricular ejection fraction by 3D volume  is 60 %. The left ventricle has mildly decreased function. The left  ventricle demonstrates regional wall motion  abnormalities (see scoring diagram/findings for description). Left  ventricular diastolic parameters are consistent with Grade I diastolic  dysfunction (impaired relaxation). There is severe akinesis of the left  ventricular, mid-apical anteroseptal wall  and septal wall. There is severe akinesis of the left ventricular, apical  lateral wall and inferolateral wall.   2. Right ventricular systolic function is normal. The right ventricular   size is normal.   3. Left atrial size was moderately dilated.   4. Right atrial size was severely dilated.   5. The mitral valve is normal in structure. Mild to moderate mitral valve  regurgitation. No evidence of mitral stenosis.   6. Tricuspid valve regurgitation is moderate to severe.   7. The aortic valve is calcified. There is mild calcification of the  aortic valve. There is mild thickening of the aortic valve. Aortic valve  regurgitation is not visualized. Aortic valve sclerosis/calcification is  present, without any evidence of  aortic stenosis.   8. The inferior vena cava is normal in size with greater than 50%  respiratory variability, suggesting right atrial pressure of 3 mmHg.   Comparison(s): EF 45%, moderate TR, mild AS mean 5, peak 8 mmHg, RV  function decreased, akinesis of the left ventricular, apical anteroseptal  wall, inferoseptal wall, anterior wall, inferior wall, anterolateral wall  and inferolateral wall.      EKG:  EKG is  ordered today.  The ekg ordered today demonstrates atrial flutter rate 114. RBBB.cannot rule out old inferior and lateral infarct. I have personally reviewed and interpreted this study.   Recent Labs: 05/14/2021: ALT 13; BUN 23; Creatinine, Ser 1.15; Hemoglobin 14.9; Platelets 243; Potassium 4.4; Sodium 142; TSH 7.690  Recent Lipid Panel    Component Value Date/Time   CHOL 129 05/14/2021 0957   TRIG 76 05/14/2021 0957   TRIG 44 08/13/2008 0000   HDL 54 05/14/2021 0957   CHOLHDL 2.4 05/14/2021 0957   CHOLHDL 3.2 07/15/2017 2329   VLDL 10 07/15/2017 2329   LDLCALC 60 05/14/2021 0957     Risk Assessment/Calculations:    CHA2DS2-VASc Score = 7   This indicates a 11.2% annual risk of stroke. The patient's score is based upon: CHF History: 1 HTN History: 1 Diabetes History: 0 Stroke History: 2 Vascular Disease History: 1 Age Score: 2 Gender Score: 0               Physical Exam:    VS:  BP 136/79   Pulse (!) 53   Ht  5' 9.5" (1.765 m)   Wt 163 lb 8 oz (74.2 kg)   SpO2 95%   BMI 23.80 kg/m     Wt Readings from Last 3 Encounters:  04/02/22 163 lb 8 oz (74.2 kg)  01/28/22 166 lb (75.3 kg)  08/06/21 171 lb (77.6 kg)     GEN:  Well nourished, elderly WM in no  acute distress HEENT: Normal NECK: No JVD; No carotid bruits LYMPHATICS: No lymphadenopathy CARDIAC: IRRR, no murmurs, rubs, gallops RESPIRATORY:  Clear to auscultation without rales, wheezing or rhonchi  ABDOMEN: Soft, non-tender, non-distended MUSCULOSKELETAL:  No edema; No deformity  SKIN: Warm and dry NEUROLOGIC:  Alert and oriented x 3 PSYCHIATRIC:  Normal affect   ASSESSMENT:    1. Atrial flutter, unspecified type ([place])   2. PAF (paroxysmal atrial fibrillation) ([place])   3. Pre-procedure lab exam   4. Aortic valve stenosis, etiology of cardiac valve disease unspecified   5. Coronary artery disease involving native coronary artery of native heart without angina pectoris    PLAN:    In order of problems listed above:  Recurrent atrial flutter. Symptomatic. Compliant with anticoagulation. On chronic amiodarone. Plan repeat DCCV. Will schedule as soon as possible. If symptoms worsen may need to go to ED. The procedure and risks were reviewed including but not limited to death, myocardial infarction, stroke, arrythmias. The patient voices understanding and is agreeable to proceed. CAD with remote CABG Old CVA.  HTN. Chronic systolic CHF. Baseline EF 45%.  History of mural thrombus. PAD.       Shared Decision Making/Informed Consent The risks (stroke, cardiac arrhythmias rarely resulting in the need for a temporary or permanent pacemaker, skin irritation or burns and complications associated with conscious sedation including aspiration, arrhythmia, respiratory failure and death), benefits (restoration of normal sinus rhythm) and alternatives of a direct current cardioversion were explained in detail to Mr. Quinney and he agrees to  proceed.      Medication Adjustments/Labs and Tests Ordered: Current medicines are reviewed at length with the patient today.  Concerns regarding medicines are outlined above.  Orders Placed This Encounter  Procedures   Basic metabolic panel   CBC w/Diff/Platelet   Meds ordered this encounter  Medications   Rivaroxaban (XARELTO) 15 MG TABS tablet    Sig: Take 1 tablet (15 mg total) by mouth daily with supper.    There are no Patient Instructions on file for this visit.   Signed, Evana Runnels [place], MD  04/02/2022 4:25 PM    [place]

## 2022-04-02 NOTE — Progress Notes (Deleted)
HPI: FU CAD, CHF and atrial fibrillation. He is status post coronary bypassing graft in 1990; also with h/o ischemic cardiomyopathy, prior stroke and prior mural apical thrombus. Prior abdominal ultrasound in May 2006 showed no aneurysm. Carotid Dopplers in November of 2005 showed 0-39% bilaterally.  LHC 09/24/11: LM 30, oLAD 100%, dLAD fills via RV branch off RCA, IM diffuse 95% subtotal disease, mCFX 40%, OM1 normal, RCA normal with large RV branch supplying dLAD, PLA possibly occluded and most dRCA appears to fill from LIMA collats; LIMA patent but does not supply dLAD (connects via small vessel to ? DRCA/PLA, S-IM occluded). Circulation felt to be stable. Continue med Rx recommended. Nuclear study 2018 showed ejection fraction 46%. There was infarct but no ischemia.  Patient had a fall in April 2019 and suffered a small subdural hematoma.  He was ultimately also diagnosed with right pelvic fracture treated conservatively. CTA showed occlusion of the right popliteal artery of indeterminate chronicity; followed by vascular surgery.  Patient also with hypotension/orthostasis. Patient had successful cardioversion on November 23, 2017. Amiodarone decreased to 100 mg daily October 2019 as patient complained of dizziness, tremors, and constipation. Follow-up echocardiogram 6/22 showed ejection fraction 45 to 50%, moderate RVE, moderate RV dysfunction, mild LAE, severe RAE, mild MR, moderate TR, mild AS.  Had elective cardioversion of atrial flutter June 03, 2021.  Since last seen, he denies dyspnea, chest pain, palpitations or syncope.  Current Outpatient Medications  Medication Sig Dispense Refill   alendronate (FOSAMAX) 70 MG tablet Take 70 mg by mouth every Sunday. Take with a full glass of water on an empty stomach.     amiodarone (PACERONE) 100 MG tablet Take 1 tablet (100 mg total) by mouth daily. 90 tablet 3   apixaban (ELIQUIS) 5 MG TABS tablet Take 1 tablet (5 mg total) by mouth 2 (two) times daily.  180 tablet 3   Cholecalciferol (VITAMIN D) 2000 UNITS tablet Take 2,000 Units by mouth daily.     fluticasone furoate-vilanterol (BREO ELLIPTA) 100-25 MCG/INH AEPB Inhale 1 puff into the lungs daily as needed (asthma).     furosemide (LASIX) 40 MG tablet TAKE 2 TABLETS ('80MG'$  TOTAL)TWO TIMES A DAY 360 tablet 0   latanoprost (XALATAN) 0.005 % ophthalmic solution Place 1 drop into the left eye at bedtime.     levothyroxine (SYNTHROID) 25 MCG tablet TAKE 1 TABLET DAILY BEFORE BREAKFAST 90 tablet 2   loratadine (CLARITIN) 10 MG tablet Take 10 mg by mouth daily.     Multiple Vitamin (MULTIVITAMIN WITH MINERALS) TABS tablet Take 1 tablet by mouth daily.     pravastatin (PRAVACHOL) 40 MG tablet TAKE 1 TABLET EVERY EVENING 90 tablet 3   tamsulosin (FLOMAX) 0.4 MG CAPS capsule Take 0.4 mg by mouth every evening.     No current facility-administered medications for this visit.     Past Medical History:  Diagnosis Date   Asthma    with worsening with beta blockade   Atrial fibrillation (HCC)    CAD (coronary artery disease)    CVA (cerebral infarction) 2012   tia, mild no residual defecits   GI bleed 8 yrs ago   due to doll fully vessel which was clipped   Glaucoma    excellent cataracts   History of kidney stones    Ischemic cardiomyopathy    Moderate to severe mitral regurgitation 06/16/2017   Nephrolithiasis    Post-infarction apical thrombus [facility])    Prostate cancer ([place]) 2012   Radiation  proctitis Feb 2015   treated with APC   Tubular adenoma 04/2013   Dr. Hilarie Fredrickson    Past Surgical History:  Procedure Laterality Date   cardiac stents  2003   CARDIOVERSION N/A 06/15/2017   Procedure: CARDIOVERSION;  Surgeon: Acie Fredrickson, Wonda Cheng, MD;  Location: [place];  Service: Cardiovascular;  Laterality: N/A;   CARDIOVERSION N/A 07/15/2017   Procedure: CARDIOVERSION;  Surgeon: Jerline Pain, MD;  Location: [place];  Service: Cardiovascular;  Laterality: N/A;   CARDIOVERSION N/A 07/20/2017    Procedure: CARDIOVERSION;  Surgeon: Herminio Commons, MD;  Location: [facility] ENDOSCOPY;  Service: Cardiovascular;  Laterality: N/A;   CARDIOVERSION N/A 11/23/2017   Procedure: CARDIOVERSION;  Surgeon: Lelon Perla, MD;  Location: [facility] ENDOSCOPY;  Service: Cardiovascular;  Laterality: N/A;   CARDIOVERSION N/A 06/03/2021   Procedure: CARDIOVERSION;  Surgeon: [name], MD;  Location: [place];  Service: Cardiovascular;  Laterality: N/A;   COLONOSCOPY WITH PROPOFOL N/A 05/05/2013   Procedure: COLONOSCOPY WITH PROPOFOL;  Surgeon: Jerene Bears, MD;  [facility];  Service: Gastroenterology;  Laterality: N/A;   CORONARY ARTERY BYPASS GRAFT  1998   LIMA to the LAD and saphenous vein graft tothe diagonal   EXTRACORPOREAL SHOCK WAVE LITHOTRIPSY Right 04/29/2017   Procedure: RIGHT EXTRACORPOREAL SHOCK WAVE LITHOTRIPSY (ESWL);  Surgeon: Alexis Frock, MD;  [facility];  Service: Urology;  Laterality: Right;   history of radiation treatment  2012   x 40 treatments done   KNEE ARTHROSCOPY  2016 or 2017   Dr Gladstone Lighter ;    PARTIAL KNEE ARTHROPLASTY Right 01/13/2017   Procedure: UNICOMPARTMENTAL RIGHT KNEE;  Surgeon: Gaynelle Arabian, MD;  Location[facility];  Service: Orthopedics;  Laterality: Right;  with block    Social History   Socioeconomic History   Marital status: Widowed    Spouse name: Not on file   Number of children: 3   Years of education: Not on file   Highest education level: Not on file  Occupational History   Occupation: Retired Conservation officer, nature  Tobacco Use   Smoking status: Never   Smokeless tobacco: Never  Vaping Use   Vaping Use: Never used  Substance and Sexual Activity   Alcohol use: Yes    Alcohol/week: 0.0 standard drinks of alcohol    Comment: occasional beer or wine   Drug use: No   Sexual activity: Not Currently    Birth control/protection: Abstinence  Other Topics Concern   Not on file  Social History Narrative   Not on file    Social Determinants of Health   Financial Resource Strain: Not on file  Food Insecurity: Not on file  Transportation Needs: Not on file  Physical Activity: Not on file  Stress: Not on file  Social Connections: Not on file  Intimate Partner Violence: Not on file    Family History  Problem Relation Age of Onset   Heart attack Father    Heart disease Mother    Stomach cancer Mother     ROS: no fevers or chills, productive cough, hemoptysis, dysphasia, odynophagia, melena, hematochezia, dysuria, hematuria, rash, seizure activity, orthopnea, PND, pedal edema, claudication. Remaining systems are negative.  Physical Exam: Well-developed well-nourished in no acute distress.  Skin is warm and dry.  HEENT is normal.  Neck is supple.  Chest is clear to auscultation with normal expansion.  Cardiovascular exam is regular rate and rhythm.  2/6 systolic murmur left sternal border. Abdominal exam nontender or distended. No masses palpated.  Extremities show no edema. neuro grossly intact  ECG-normal sinus rhythm at a rate of 63, right bundle branch block, inferior infarct.  Personally reviewed  A/P  1 paroxysmal atrial fibrillation/flutter-he is in sinus rhythm today.  We will resume amiodarone at 100 mg daily.  Continue Xarelto.  We will have most recent laboratories forwarded to [place] from primary care.  2 chronic combined systolic/diastolic congestive heart failure-patient is euvolemic on examination.  We will continue diuretic at present dose.    3 coronary artery disease-patient denies chest pain.  Continue statin.  Patient is not on aspirin given need for anticoagulation.  4 hyperlipidemia-continue statin.   5 ischemic cardiomyopathy-He is not on an ARB or Entresto given history of orthostasis.  Beta-blocker also discontinued previously due to borderline blood pressure.  6 apical thrombus-continue Xarelto.  7 history of mild aortic stenosis-plan repeat echocardiogram.  8  peripheral vascular disease-followed by vascular surgery.  Mekesha Solomon [place], MD

## 2022-04-02 NOTE — Progress Notes (Signed)
Cardiology Office Note:    Date:  04/02/2022   ID:  Donald [name], DOB [birth year], MRN [id number]  PCP:  Haywood Pao, MD   [place] Providers Cardiologist:  Kirk Ruths, MD d    Referring MD: Haywood Pao, MD   Chief Complaint  Patient presents with   Atrial Flutter    History of Present Illness:    Donald [name] is a 89 y.o. male seen as a work in for recurrent atrial flutter. He has a history of CAD s/p CABG in 1990. History of ischemic CM with EF 45% by Echo in November. Prior history of CVA and mural thrombus. On Chronic anticoagulation with Xarelto 15 mg daily. Carotid Dopplers in November of 2005 showed 0-39% bilaterally.  LHC 09/24/11: LM 30, oLAD 100%, dLAD fills via RV branch off RCA, IM diffuse 95% subtotal disease, mCFX 40%, OM1 normal, RCA normal with large RV branch supplying dLAD, PLA possibly occluded and most dRCA appears to fill from LIMA collats; LIMA patent but does not supply dLAD (connects via small vessel to ? DRCA/PLA, S-IM occluded). Circulation felt to be stable. Continue med Rx recommended.   Nuclear study 2018 showed ejection fraction 46%. There was infarct but no ischemia.  Patient had a fall in April 2019 and suffered a small subdural hematoma.  He was ultimately also diagnosed with right pelvic fracture treated conservatively. CTA showed occlusion of the right popliteal artery of indeterminate chronicity; followed by vascular surgery.  Patient also with hypotension/orthostasis.   Patient has known Afib/flutter. Patient had successful cardioversion on November 23, 2017. Amiodarone decreased to 100 mg daily October 2019 as patient complained of dizziness, tremors, and constipation. Follow-up echocardiogram 6/22 showed ejection fraction 45 to 50%, moderate RVE, moderate RV dysfunction, mild LAE, severe RAE, mild MR, moderate TR, mild AS.  Had elective cardioversion of atrial flutter June 03, 2021. Did well up until 2 days ago when noted  significant increase in SOB and dizziness. Yesterday states he passed out for one second. Came in today and found to have recurrent Aflutter rate 114. Has been compliant with anticoagulation.   Denies any chest pain.   Past Medical History:  Diagnosis Date   Asthma    with worsening with beta blockade   Atrial fibrillation (HCC)    CAD (coronary artery disease)    CVA (cerebral infarction) 2012   tia, mild no residual defecits   GI bleed 8 yrs ago   due to doll fully vessel which was clipped   Glaucoma    excellent cataracts   History of kidney stones    Ischemic cardiomyopathy    Moderate to severe mitral regurgitation 06/16/2017   Nephrolithiasis    Post-infarction apical thrombus [facility])    Prostate cancer [facility]) 2012   Radiation proctitis Feb 2015   treated with APC   Tubular adenoma 04/2013   Dr. Hilarie Fredrickson    Past Surgical History:  Procedure Laterality Date   cardiac stents  2003   CARDIOVERSION N/A 06/15/2017   Procedure: CARDIOVERSION;  Surgeon: Acie Fredrickson Wonda Cheng, MD;  Location: [place];  Service: Cardiovascular;  Laterality: N/A;   CARDIOVERSION N/A 07/15/2017   Procedure: CARDIOVERSION;  Surgeon: Jerline Pain, MD;  Location: [facility] ENDOSCOPY;  Service: Cardiovascular;  Laterality: N/A;   CARDIOVERSION N/A 07/20/2017   Procedure: CARDIOVERSION;  Surgeon: Herminio Commons, MD;  Location: [facility] ENDOSCOPY;  Service: Cardiovascular;  Laterality: N/A;   CARDIOVERSION N/A 11/23/2017   Procedure: CARDIOVERSION;  Surgeon:  Lelon Perla, MD;  Location: [place];  Service: Cardiovascular;  Laterality: N/A;   CARDIOVERSION N/A 06/03/2021   Procedure: CARDIOVERSION;  Surgeon: [name], MD;  Location: [place];  Service: Cardiovascular;  Laterality: N/A;   COLONOSCOPY WITH PROPOFOL N/A 05/05/2013   Procedure: COLONOSCOPY WITH PROPOFOL;  Surgeon: Jerene Bears, MD;  Location: [facility] ENDOSCOPY;  Service: Gastroenterology;  Laterality: N/A;   CORONARY ARTERY BYPASS GRAFT   1998   LIMA to the LAD and saphenous vein graft tothe diagonal   EXTRACORPOREAL SHOCK WAVE LITHOTRIPSY Right 04/29/2017   Procedure: RIGHT EXTRACORPOREAL SHOCK WAVE LITHOTRIPSY (ESWL);  Surgeon: Alexis Frock, MD;  Location: [facility] ORS;  Service: Urology;  Laterality: Right;   history of radiation treatment  2012   x 40 treatments done   KNEE ARTHROSCOPY  2016 or 2017   Dr Gladstone Lighter ;    PARTIAL KNEE ARTHROPLASTY Right 01/13/2017   Procedure: UNICOMPARTMENTAL RIGHT KNEE;  Surgeon: Gaynelle Arabian, MD;  Location: [facility] ORS;  Service: Orthopedics;  Laterality: Right;  with block    Current Medications: Current Meds  Medication Sig   alendronate (FOSAMAX) 70 MG tablet Take 70 mg by mouth every Sunday. Take with a full glass of water on an empty stomach.   amiodarone (PACERONE) 100 MG tablet Take 1 tablet (100 mg total) by mouth daily.   Cholecalciferol (VITAMIN D) 2000 UNITS tablet Take 2,000 Units by mouth daily.   fluticasone furoate-vilanterol (BREO ELLIPTA) 100-25 MCG/INH AEPB Inhale 1 puff into the lungs daily as needed (asthma).   furosemide (LASIX) 40 MG tablet TAKE 2 TABLETS ('80MG'$  TOTAL)TWO TIMES A DAY   latanoprost (XALATAN) 0.005 % ophthalmic solution Place 1 drop into the left eye at bedtime.   levothyroxine (SYNTHROID) 25 MCG tablet TAKE 1 TABLET DAILY BEFORE BREAKFAST   loratadine (CLARITIN) 10 MG tablet Take 10 mg by mouth daily.   Multiple Vitamin (MULTIVITAMIN WITH MINERALS) TABS tablet Take 1 tablet by mouth daily.   pravastatin (PRAVACHOL) 40 MG tablet TAKE 1 TABLET EVERY EVENING   Rivaroxaban (XARELTO) 15 MG TABS tablet Take 1 tablet (15 mg total) by mouth daily with supper.   tamsulosin (FLOMAX) 0.4 MG CAPS capsule Take 0.4 mg by mouth every evening.   [DISCONTINUED] apixaban (ELIQUIS) 5 MG TABS tablet Take 1 tablet (5 mg total) by mouth 2 (two) times daily.     Allergies:   Beta adrenergic blockers, Keflex [cephalexin], and Morphine   Social History   Socioeconomic History    Marital status: Widowed    Spouse name: Not on file   Number of children: 3   Years of education: Not on file   Highest education level: Not on file  Occupational History   Occupation: Retired Conservation officer, nature  Tobacco Use   Smoking status: Never   Smokeless tobacco: Never  Vaping Use   Vaping Use: Never used  Substance and Sexual Activity   Alcohol use: Yes    Alcohol/week: 0.0 standard drinks of alcohol    Comment: occasional beer or wine   Drug use: No   Sexual activity: Not Currently    Birth control/protection: Abstinence  Other Topics Concern   Not on file  Social History Narrative   Not on file   Social Determinants of Health   Financial Resource Strain: Not on file  Food Insecurity: Not on file  Transportation Needs: Not on file  Physical Activity: Not on file  Stress: Not on file  Social Connections: Not on file  Family History: The patient's family history includes Heart attack in his father; Heart disease in his mother; Stomach cancer in his mother.  ROS:   Please see the history of present illness.     All other systems reviewed and are negative.  EKGs/Labs/Other Studies Reviewed:    The following studies were reviewed today: Echo 02/11/22: IMPRESSIONS     1. 3D EF - overestimated. Left ventricular ejection fraction, by  estimation, is 40 to 45%. Left ventricular ejection fraction by 3D volume  is 60 %. The left ventricle has mildly decreased function. The left  ventricle demonstrates regional wall motion  abnormalities (see scoring diagram/findings for description). Left  ventricular diastolic parameters are consistent with Grade I diastolic  dysfunction (impaired relaxation). There is severe akinesis of the left  ventricular, mid-apical anteroseptal wall  and septal wall. There is severe akinesis of the left ventricular, apical  lateral wall and inferolateral wall.   2. Right ventricular systolic function is normal. The right ventricular   size is normal.   3. Left atrial size was moderately dilated.   4. Right atrial size was severely dilated.   5. The mitral valve is normal in structure. Mild to moderate mitral valve  regurgitation. No evidence of mitral stenosis.   6. Tricuspid valve regurgitation is moderate to severe.   7. The aortic valve is calcified. There is mild calcification of the  aortic valve. There is mild thickening of the aortic valve. Aortic valve  regurgitation is not visualized. Aortic valve sclerosis/calcification is  present, without any evidence of  aortic stenosis.   8. The inferior vena cava is normal in size with greater than 50%  respiratory variability, suggesting right atrial pressure of 3 mmHg.   Comparison(s): EF 45%, moderate TR, mild AS mean 5, peak 8 mmHg, RV  function decreased, akinesis of the left ventricular, apical anteroseptal  wall, inferoseptal wall, anterior wall, inferior wall, anterolateral wall  and inferolateral wall.      EKG:  EKG is  ordered today.  The ekg ordered today demonstrates atrial flutter rate 114. RBBB.cannot rule out old inferior and lateral infarct. I have personally reviewed and interpreted this study.   Recent Labs: 05/14/2021: ALT 13; BUN 23; Creatinine, Ser 1.15; Hemoglobin 14.9; Platelets 243; Potassium 4.4; Sodium 142; TSH 7.690  Recent Lipid Panel    Component Value Date/Time   CHOL 129 05/14/2021 0957   TRIG 76 05/14/2021 0957   TRIG 44 08/13/2008 0000   HDL 54 05/14/2021 0957   CHOLHDL 2.4 05/14/2021 0957   CHOLHDL 3.2 07/15/2017 2329   VLDL 10 07/15/2017 2329   LDLCALC 60 05/14/2021 0957     Risk Assessment/Calculations:    CHA2DS2-VASc Score = 7   This indicates a 11.2% annual risk of stroke. The patient's score is based upon: CHF History: 1 HTN History: 1 Diabetes History: 0 Stroke History: 2 Vascular Disease History: 1 Age Score: 2 Gender Score: 0               Physical Exam:    VS:  BP 136/79   Pulse (!) 53   Ht  5' 9.5" (1.765 m)   Wt 163 lb 8 oz (74.2 kg)   SpO2 95%   BMI 23.80 kg/m     Wt Readings from Last 3 Encounters:  04/02/22 163 lb 8 oz (74.2 kg)  01/28/22 166 lb (75.3 kg)  08/06/21 171 lb (77.6 kg)     GEN:  Well nourished, elderly WM in no  acute distress HEENT: Normal NECK: No JVD; No carotid bruits LYMPHATICS: No lymphadenopathy CARDIAC: IRRR, no murmurs, rubs, gallops RESPIRATORY:  Clear to auscultation without rales, wheezing or rhonchi  ABDOMEN: Soft, non-tender, non-distended MUSCULOSKELETAL:  No edema; No deformity  SKIN: Warm and dry NEUROLOGIC:  Alert and oriented x 3 PSYCHIATRIC:  Normal affect   ASSESSMENT:    1. Atrial flutter, unspecified type ([place])   2. PAF (paroxysmal atrial fibrillation) ([place])   3. Pre-procedure lab exam   4. Aortic valve stenosis, etiology of cardiac valve disease unspecified   5. Coronary artery disease involving native coronary artery of native heart without angina pectoris    PLAN:    In order of problems listed above:  Recurrent atrial flutter. Symptomatic. Compliant with anticoagulation. On chronic amiodarone. Plan repeat DCCV. Will schedule as soon as possible. If symptoms worsen may need to go to ED. The procedure and risks were reviewed including but not limited to death, myocardial infarction, stroke, arrythmias. The patient voices understanding and is agreeable to proceed. CAD with remote CABG Old CVA.  HTN. Chronic systolic CHF. Baseline EF 45%.  History of mural thrombus. PAD.       Shared Decision Making/Informed Consent The risks (stroke, cardiac arrhythmias rarely resulting in the need for a temporary or permanent pacemaker, skin irritation or burns and complications associated with conscious sedation including aspiration, arrhythmia, respiratory failure and death), benefits (restoration of normal sinus rhythm) and alternatives of a direct current cardioversion were explained in detail to Mr. Aguayo and he agrees to  proceed.      Medication Adjustments/Labs and Tests Ordered: Current medicines are reviewed at length with the patient today.  Concerns regarding medicines are outlined above.  Orders Placed This Encounter  Procedures   Basic metabolic panel   CBC w/Diff/Platelet   Meds ordered this encounter  Medications   Rivaroxaban (XARELTO) 15 MG TABS tablet    Sig: Take 1 tablet (15 mg total) by mouth daily with supper.    There are no Patient Instructions on file for this visit.   Signed, Janos Shampine Martinique, MD  04/02/2022 4:25 PM    [place]

## 2022-04-02 NOTE — Telephone Encounter (Signed)
Discussed with dr Martinique (DOD), patient will come to the office to be seen today at 4:30 p.

## 2022-04-02 NOTE — Patient Instructions (Signed)
Medication Instructions:  Continue same medications *If you need a refill on your cardiac medications before your next appointment, please call your pharmacy*   Lab Work: Bmet,cbc today   Testing/Procedures: Cardioversion scheduled at [facility]   Follow instructions below   Follow-Up: At [facility], you and your health needs are our priority.  As part of our continuing mission to provide you with exceptional heart care, we have created designated Provider Care Teams.  These Care Teams include your primary Cardiologist (physician) and Advanced Practice Providers (APPs -  Physician Assistants and Nurse Practitioners) who all work together to provide you with the care you need, when you need it.  We recommend signing up for the patient portal called "MyChart".  Sign up information is provided on this After Visit Summary.  MyChart is used to connect with patients for Virtual Visits (Telemedicine).  Patients are able to view lab/test results, encounter notes, upcoming appointments, etc.  Non-urgent messages can be sent to your provider as well.   To learn more about what you can do with MyChart, go to [email or web address].    Your next appointment:  After Cardioversion    The format for your next appointment: Office   Provider:  Dr.Crenshaw       Dear Donald [name]  You are scheduled for a Cardioversion on Friday, January 5 with Dr. Sallyanne Kuster.  Please arrive at the [facility] (Main Entrance A) at [facility]: [address] [place], [place] [address] at 6:30 AM.   DIET:  Nothing to eat or drink after midnight except a sip of water with medications (see medication instructions below)  MEDICATION INSTRUCTIONS:  Take Xarelto 15 mg daily with supper  Continue taking your anticoagulant (blood thinner):  You will need to continue this after your procedure until you are told by your provider that it is safe to stop.    LABS: bmet,cbc today   FYI:  For  your safety, and to allow [place] to monitor your vital signs accurately during the surgery/procedure we request: If you have artificial nails, gel coating, SNS etc, please have those removed prior to your surgery/procedure. Not having the nail coverings /polish removed may result in cancellation or delay of your surgery/procedure.  You must have a responsible person to drive you home and stay in the waiting area during your procedure. Failure to do so could result in cancellation.  Bring your insurance cards.  *Special Note: Every effort is made to have your procedure done on time. Occasionally there are emergencies that occur at the hospital that may cause delays. Please be patient if a delay does occur.       Important Information About Sugar

## 2022-04-02 NOTE — Telephone Encounter (Signed)
Spoke with pt, he reports dizziness with standing for sometime. Yesterday he was dizzy and went to the kitchen to get a drank and found himself sliding down the wall to the floor. He does not remember anything but does not think he went out. He sat there for a while then got up and went down the hall to exercise class. On the way to exercise class, he had to sit down because of dizziness. The nurse checked his blood pressure and it was 103/70. He went to exercise class and did fine. Later in the day he went to a meeting and the nurse told him he did not look good and sat him down, he reports he felt bad. Later in the evening he decided to place his pulse ox on and his heart rate went from 138 to 58 in about 2 minutes. This morning he feels okay and his blood pressure is 103/72 120 bpm. He reports his blood pressure usually runs 118-105/70"s prior to taking his furosemide. He took his furosemide 10 minutes prior to my call. He is currently taking 40 mg of furosemide in the am and 80 mg in the pm. He denies any SOB or swelling and his weight is stable at 165 lb.

## 2022-04-03 ENCOUNTER — Ambulatory Visit (HOSPITAL_COMMUNITY): Payer: Medicare Other | Admitting: Anesthesiology

## 2022-04-03 ENCOUNTER — Ambulatory Visit (HOSPITAL_COMMUNITY)
Admission: RE | Admit: 2022-04-03 | Discharge: 2022-04-03 | Disposition: A | Discharge status: Home / Self Care | Payer: Medicare Other | Source: Ambulatory Visit | Attending: Cardiovascular Disease | Admitting: Cardiovascular Disease

## 2022-04-03 ENCOUNTER — Encounter (HOSPITAL_COMMUNITY): Payer: Self-pay | Admitting: Cardiovascular Disease

## 2022-04-03 ENCOUNTER — Encounter (HOSPITAL_COMMUNITY)
Admission: RE | Disposition: A | Discharge status: Home / Self Care | Payer: Self-pay | Source: Ambulatory Visit | Attending: Cardiovascular Disease

## 2022-04-03 ENCOUNTER — Ambulatory Visit (HOSPITAL_BASED_OUTPATIENT_CLINIC_OR_DEPARTMENT_OTHER): Payer: Medicare Other | Admitting: Anesthesiology

## 2022-04-03 DIAGNOSIS — I483 Typical atrial flutter: Secondary | ICD-10-CM

## 2022-04-03 DIAGNOSIS — J45909 Unspecified asthma, uncomplicated: Secondary | ICD-10-CM | POA: Diagnosis not present

## 2022-04-03 DIAGNOSIS — I255 Ischemic cardiomyopathy: Secondary | ICD-10-CM | POA: Diagnosis not present

## 2022-04-03 DIAGNOSIS — I5022 Chronic systolic (congestive) heart failure: Secondary | ICD-10-CM | POA: Diagnosis not present

## 2022-04-03 DIAGNOSIS — Z8673 Personal history of transient ischemic attack (TIA), and cerebral infarction without residual deficits: Secondary | ICD-10-CM | POA: Diagnosis not present

## 2022-04-03 DIAGNOSIS — I110 Hypertensive heart disease with heart failure: Secondary | ICD-10-CM | POA: Insufficient documentation

## 2022-04-03 DIAGNOSIS — I2510 Atherosclerotic heart disease of native coronary artery without angina pectoris: Secondary | ICD-10-CM | POA: Insufficient documentation

## 2022-04-03 DIAGNOSIS — I480 Paroxysmal atrial fibrillation: Secondary | ICD-10-CM | POA: Diagnosis not present

## 2022-04-03 DIAGNOSIS — Z7901 Long term (current) use of anticoagulants: Secondary | ICD-10-CM | POA: Diagnosis not present

## 2022-04-03 DIAGNOSIS — I739 Peripheral vascular disease, unspecified: Secondary | ICD-10-CM | POA: Insufficient documentation

## 2022-04-03 DIAGNOSIS — E039 Hypothyroidism, unspecified: Secondary | ICD-10-CM

## 2022-04-03 DIAGNOSIS — I4892 Unspecified atrial flutter: Secondary | ICD-10-CM | POA: Diagnosis not present

## 2022-04-03 DIAGNOSIS — I509 Heart failure, unspecified: Secondary | ICD-10-CM | POA: Diagnosis not present

## 2022-04-03 DIAGNOSIS — I350 Nonrheumatic aortic (valve) stenosis: Secondary | ICD-10-CM | POA: Insufficient documentation

## 2022-04-03 DIAGNOSIS — Z951 Presence of aortocoronary bypass graft: Secondary | ICD-10-CM | POA: Diagnosis not present

## 2022-04-03 HISTORY — PX: CARDIOVERSION: SHX1299

## 2022-04-03 SURGERY — CARDIOVERSION
Anesthesia: General

## 2022-04-03 MED ORDER — SODIUM CHLORIDE 0.9 % IV SOLN: INTRAVENOUS | Status: DC

## 2022-04-03 MED ORDER — PROPOFOL 10 MG/ML IV BOLUS
INTRAVENOUS | Status: DC | PRN
  Administered 2022-04-03: 40 mg via INTRAVENOUS

## 2022-04-03 NOTE — Interval H&P Note (Signed)
History and Physical Interval Note:  04/03/2022 7:30 AM  Donald [name]  has presented today for surgery, with the diagnosis of afib.  The various methods of treatment have been discussed with the patient and family. After consideration of risks, benefits and other options for treatment, the patient has consented to  Procedure(s): CARDIOVERSION (N/A) as a surgical intervention.  The patient's history has been reviewed, patient examined, no change in status, stable for surgery.  I have reviewed the patient's chart and labs.  Questions were answered to the patient's satisfaction.     Dalasia Predmore

## 2022-04-03 NOTE — Transfer of Care (Signed)
Immediate Anesthesia Transfer of Care Note  Patient: Donald [name]  Procedure(s) Performed: CARDIOVERSION  Patient Location: Endoscopy Unit  Anesthesia Type:General  Level of Consciousness: drowsy  Airway & Oxygen Therapy: Patient Spontanous Breathing  Post-op Assessment: Report given to RN and Post -op Vital signs reviewed and stable  Post vital signs: Reviewed and stable  Last Vitals:  Vitals Value Taken Time  BP 100/63   Temp    Pulse 69   Resp 15   SpO2 96     Last Pain:  Vitals:   04/03/22 0652  TempSrc: Temporal  PainSc:          Complications: No notable events documented.

## 2022-04-03 NOTE — Anesthesia Postprocedure Evaluation (Signed)
Anesthesia Post Note  Patient: Donald [name]  Procedure(s) Performed: CARDIOVERSION     Patient location during evaluation: Endoscopy Anesthesia Type: General Level of consciousness: awake and alert Pain management: pain level controlled Vital Signs Assessment: post-procedure vital signs reviewed and stable Respiratory status: spontaneous breathing, nonlabored ventilation and respiratory function stable Cardiovascular status: stable Postop Assessment: no apparent nausea or vomiting Anesthetic complications: no  No notable events documented.  Last Vitals:  Vitals:   04/03/22 0755 04/03/22 0800  BP: 91/64 99/65  Pulse: 67 67  Resp: 20 18  Temp:    SpO2: 94% 94%    Last Pain:  Vitals:   04/03/22 0800  TempSrc:   PainSc: 0-No pain                 Marlaine Arey

## 2022-04-03 NOTE — Anesthesia Preprocedure Evaluation (Signed)
Anesthesia Evaluation  Patient identified by MRN, date of birth, ID band Patient awake    Reviewed: Allergy & Precautions, NPO status , Patient's Chart, lab work & pertinent test results  History of Anesthesia Complications Negative for: history of anesthetic complications  Airway Mallampati: III  TM Distance: >3 FB Neck ROM: Full    Dental  (+) Teeth Intact, Dental Advisory Given   Pulmonary asthma    breath sounds clear to auscultation       Cardiovascular + CAD, + CABG, + Peripheral Vascular Disease and +CHF   Rhythm:Irregular     Neuro/Psych neg Seizures CVA  negative psych ROS   GI/Hepatic negative GI ROS, Neg liver ROS,,,  Endo/Other  Hypothyroidism    Renal/GU Renal InsufficiencyRenal diseaseLab Results      Component                Value               Date                      CREATININE               1.15                04/02/2022                Musculoskeletal  (+) Arthritis ,    Abdominal   Peds  Hematology Lab Results      Component                Value               Date                      WBC                      6.4                 04/02/2022                HGB                      14.1                04/02/2022                HCT                      43.3                04/02/2022                MCV                      97                  04/02/2022                PLT                      237                 04/02/2022            xarelto   Anesthesia Other Findings   Reproductive/Obstetrics  Anesthesia Physical Anesthesia Plan  ASA: 3  Anesthesia Plan: General   Post-op Pain Management:    Induction: Intravenous  PONV Risk Score and Plan: 2  Airway Management Planned: Mask and Natural Airway  Additional Equipment: None  Intra-op Plan:   Post-operative Plan:   Informed Consent: I have reviewed the patients History and  Physical, chart, labs and discussed the procedure including the risks, benefits and alternatives for the proposed anesthesia with the patient or authorized representative who has indicated his/her understanding and acceptance.     Dental advisory given  Plan Discussed with: CRNA  Anesthesia Plan Comments:        Anesthesia Quick Evaluation

## 2022-04-03 NOTE — Anesthesia Procedure Notes (Signed)
Procedure Name: General with mask airway Date/Time: 04/03/2022 7:39 AM  Performed by: Heide Scales, CRNAPre-anesthesia Checklist: Patient identified, Emergency Drugs available, Suction available and Patient being monitored Patient Re-evaluated:Patient Re-evaluated prior to induction Oxygen Delivery Method: Ambu bag Preoxygenation: Pre-oxygenation with 100% oxygen Induction Type: IV induction

## 2022-04-03 NOTE — Discharge Instructions (Signed)

## 2022-04-03 NOTE — Op Note (Signed)
Procedure: Electrical Cardioversion Indications:  Atrial Flutter  Procedure Details:  Consent: Risks of procedure as well as the alternatives and risks of each were explained to the (patient/caregiver).  Consent for procedure obtained.  Time Out: Verified patient identification, verified procedure, site/side was marked, verified correct patient position, special equipment/implants available, medications/allergies/relevent history reviewed, required imaging and test results available.  Performed  Patient placed on cardiac monitor, pulse oximetry, supplemental oxygen as necessary.  Sedation given:  Propofol 40 mg IV, Dr. Ermalene Postin Pacer pads placed anterior and posterior chest.  Cardioverted 1 time(s).  Cardioversion with synchronized biphasic 150J shock.  Evaluation: Findings: Post procedure EKG shows: NSR Complications: None Patient did tolerate procedure well.  Time Spent Directly with the Patient:  30 minutes   Maela Takeda 04/03/2022, 7:52 AM

## 2022-04-05 ENCOUNTER — Encounter (HOSPITAL_COMMUNITY): Payer: Self-pay | Admitting: Cardiovascular Disease

## 2022-04-08 DIAGNOSIS — L821 Other seborrheic keratosis: Secondary | ICD-10-CM | POA: Diagnosis not present

## 2022-04-08 DIAGNOSIS — L570 Actinic keratosis: Secondary | ICD-10-CM | POA: Diagnosis not present

## 2022-04-08 DIAGNOSIS — Z85828 Personal history of other malignant neoplasm of skin: Secondary | ICD-10-CM | POA: Diagnosis not present

## 2022-04-08 DIAGNOSIS — D225 Melanocytic nevi of trunk: Secondary | ICD-10-CM | POA: Diagnosis not present

## 2022-04-08 DIAGNOSIS — Z08 Encounter for follow-up examination after completed treatment for malignant neoplasm: Secondary | ICD-10-CM | POA: Diagnosis not present

## 2022-04-08 DIAGNOSIS — L814 Other melanin hyperpigmentation: Secondary | ICD-10-CM | POA: Diagnosis not present

## 2022-04-17 ENCOUNTER — Ambulatory Visit: Payer: Medicare Other | Attending: Nurse Practitioner | Admitting: Nurse Practitioner

## 2022-04-17 ENCOUNTER — Encounter: Payer: Self-pay | Admitting: Nurse Practitioner

## 2022-04-17 VITALS — BP 116/60 | HR 61 | Ht 70.0 in | Wt 163.4 lb

## 2022-04-17 DIAGNOSIS — I255 Ischemic cardiomyopathy: Secondary | ICD-10-CM | POA: Diagnosis not present

## 2022-04-17 DIAGNOSIS — I739 Peripheral vascular disease, unspecified: Secondary | ICD-10-CM | POA: Diagnosis not present

## 2022-04-17 DIAGNOSIS — I350 Nonrheumatic aortic (valve) stenosis: Secondary | ICD-10-CM

## 2022-04-17 DIAGNOSIS — I5042 Chronic combined systolic (congestive) and diastolic (congestive) heart failure: Secondary | ICD-10-CM

## 2022-04-17 DIAGNOSIS — I361 Nonrheumatic tricuspid (valve) insufficiency: Secondary | ICD-10-CM

## 2022-04-17 DIAGNOSIS — I4819 Other persistent atrial fibrillation: Secondary | ICD-10-CM

## 2022-04-17 DIAGNOSIS — I340 Nonrheumatic mitral (valve) insufficiency: Secondary | ICD-10-CM | POA: Diagnosis not present

## 2022-04-17 DIAGNOSIS — I6523 Occlusion and stenosis of bilateral carotid arteries: Secondary | ICD-10-CM | POA: Diagnosis not present

## 2022-04-17 DIAGNOSIS — I4892 Unspecified atrial flutter: Secondary | ICD-10-CM

## 2022-04-17 NOTE — Patient Instructions (Signed)
Medication Instructions:  Your physician recommends that you continue on your current medications as directed. Please refer to the Current Medication list given to you today.   *If you need a refill on your cardiac medications before your next appointment, please call your pharmacy*   Lab Work: NONE ordered at this time of appointment   If you have labs (blood work) drawn today and your tests are completely normal, you will receive your results only by: [place] (if you have MyChart) OR A paper copy in the mail If you have any lab test that is abnormal or we need to change your treatment, we will call you to review the results.   Testing/Procedures: NONE ordered at this time of appointment     Follow-Up: At [facility], you and your health needs are our priority.  As part of our continuing mission to provide you with exceptional heart care, we have created designated Provider Care Teams.  These Care Teams include your primary Cardiologist (physician) and Advanced Practice Providers (APPs -  Physician Assistants and Nurse Practitioners) who all work together to provide you with the care you need, when you need it.  We recommend signing up for the patient portal called "MyChart".  Sign up information is provided on this After Visit Summary.  MyChart is used to connect with patients for Virtual Visits (Telemedicine).  Patients are able to view lab/test results, encounter notes, upcoming appointments, etc.  Non-urgent messages can be sent to your provider as well.   To learn more about what you can do with MyChart, go to [email or web address].    Your next appointment:   3-4 month(s)  Provider:   Kirk Ruths, MD     Other Instructions

## 2022-04-17 NOTE — Progress Notes (Signed)
Office Visit    Patient Name: Donald [name] Date of Encounter: 04/18/2022  Primary Care Provider:  Haywood Pao, MD Primary Cardiologist:  Kirk Ruths, MD  Chief Complaint    89 year old male with a history of persistent atrial fibrillation, CAD s/p CABG in 1990, chronic combined systolic and diastolic heart failure/ICM, apical thrombus, aortic stenosis, MR, TR, mild carotid artery stenosis, and PVD (follows with vascular surgery) who presents for follow-up related to atrial fibrillation s/p DCCV.  Past Medical History    Past Medical History:  Diagnosis Date   Asthma    with worsening with beta blockade   Atrial fibrillation (HCC)    CAD (coronary artery disease)    CVA (cerebral infarction) 2012   tia, mild no residual defecits   GI bleed 8 yrs ago   due to doll fully vessel which was clipped   Glaucoma    excellent cataracts   History of kidney stones    Ischemic cardiomyopathy    Moderate to severe mitral regurgitation 06/16/2017   Nephrolithiasis    Post-infarction apical thrombus [facility])    Prostate cancer [facility]) 2012   Radiation proctitis Feb 2015   treated with APC   Tubular adenoma 04/2013   Dr. Hilarie Fredrickson   Past Surgical History:  Procedure Laterality Date   cardiac stents  2003   CARDIOVERSION N/A 06/15/2017   Procedure: CARDIOVERSION;  Surgeon: Acie Fredrickson Wonda Cheng, MD;  Location: [place];  Service: Cardiovascular;  Laterality: N/A;   CARDIOVERSION N/A 07/15/2017   Procedure: CARDIOVERSION;  Surgeon: Jerline Pain, MD;  Location: [place];  Service: Cardiovascular;  Laterality: N/A;   CARDIOVERSION N/A 07/20/2017   Procedure: CARDIOVERSION;  Surgeon: Herminio Commons, MD;  Location: [facility] ENDOSCOPY;  Service: Cardiovascular;  Laterality: N/A;   CARDIOVERSION N/A 11/23/2017   Procedure: CARDIOVERSION;  Surgeon: Lelon Perla, MD;  Location: [facility] ENDOSCOPY;  Service: Cardiovascular;  Laterality: N/A;   CARDIOVERSION N/A 06/03/2021   Procedure:  CARDIOVERSION;  Surgeon: [name], MD;  Location: [facility] ENDOSCOPY;  Service: Cardiovascular;  Laterality: N/A;   CARDIOVERSION N/A 04/03/2022   Procedure: CARDIOVERSION;  Surgeon: Sanda Klein, MD;  Location: [facility];  Service: Cardiovascular;  Laterality: N/A;   COLONOSCOPY WITH PROPOFOL N/A 05/05/2013   Procedure: COLONOSCOPY WITH PROPOFOL;  Surgeon: Jerene Bears, MD;  Location: [facility] ENDOSCOPY;  Service: Gastroenterology;  Laterality: N/A;   CORONARY ARTERY BYPASS GRAFT  1998   LIMA to the LAD and saphenous vein graft tothe diagonal   EXTRACORPOREAL SHOCK WAVE LITHOTRIPSY Right 04/29/2017   Procedure: RIGHT EXTRACORPOREAL SHOCK WAVE LITHOTRIPSY (ESWL);  Surgeon: Alexis Frock, MD;  Location: [facility] ORS;  Service: Urology;  Laterality: Right;   history of radiation treatment  2012   x 40 treatments done   KNEE ARTHROSCOPY  2016 or 2017   Dr Gladstone Lighter ;    PARTIAL KNEE ARTHROPLASTY Right 01/13/2017   Procedure: UNICOMPARTMENTAL RIGHT KNEE;  Surgeon: Gaynelle Arabian, MD;  Location: [facility] ORS;  Service: Orthopedics;  Laterality: Right;  with block    Allergies  Allergies  Allergen Reactions   Beta Adrenergic Blockers Other (See Comments)    Other reaction(s): asthma exacerbation   Keflex [Cephalexin] Nausea And Vomiting and Other (See Comments)    'Killed all GI enzymes" (also) and patient prefers to not take this   Morphine Nausea Only     Labs/Other Studies Reviewed    The following studies were reviewed today: Echo 02/11/2022: IMPRESSIONS    1. 3D EF -  overestimated. Left ventricular ejection fraction, by  estimation, is 40 to 45%. Left ventricular ejection fraction by 3D volume  is 60 %. The left ventricle has mildly decreased function. The left  ventricle demonstrates regional wall motion  abnormalities (see scoring diagram/findings for description). Left  ventricular diastolic parameters are consistent with Grade I diastolic  dysfunction (impaired relaxation). There  is severe akinesis of the left  ventricular, mid-apical anteroseptal wall  and septal wall. There is severe akinesis of the left ventricular, apical  lateral wall and inferolateral wall.   2. Right ventricular systolic function is normal. The right ventricular  size is normal.   3. Left atrial size was moderately dilated.   4. Right atrial size was severely dilated.   5. The mitral valve is normal in structure. Mild to moderate mitral valve  regurgitation. No evidence of mitral stenosis.   6. Tricuspid valve regurgitation is moderate to severe.   7. The aortic valve is calcified. There is mild calcification of the  aortic valve. There is mild thickening of the aortic valve. Aortic valve  regurgitation is not visualized. Aortic valve sclerosis/calcification is  present, without any evidence of  aortic stenosis.   8. The inferior vena cava is normal in size with greater than 50%  respiratory variability, suggesting right atrial pressure of 3 mmHg.   Comparison(s): EF 45%, moderate TR, mild AS mean 5, peak 8 mmHg, RV  function decreased, akinesis of the left ventricular, apical anteroseptal  wall, inferoseptal wall, anterior wall, inferior wall, anterolateral wall  and inferolateral wall.   Recent Labs: 05/14/2021: ALT 13; TSH 7.690 04/02/2022: BUN 25; Creatinine, Ser 1.15; Hemoglobin 14.1; Platelets 237; Potassium 4.1; Sodium 139  Recent Lipid Panel    Component Value Date/Time   CHOL 129 05/14/2021 0957   TRIG 76 05/14/2021 0957   TRIG 44 08/13/2008 0000   HDL 54 05/14/2021 0957   CHOLHDL 2.4 05/14/2021 0957   CHOLHDL 3.2 07/15/2017 2329   VLDL 10 07/15/2017 2329   Foster 60 05/14/2021 0957    History of Present Illness    89 year old male with the above past medical history including persistent atrial fibrillation, CAD s/p CABG in 1990, chronic combined systolic and diastolic heart failure/ICM, apical thrombus, mild aortic stenosis, MR, TR, mild carotid artery stenosis, and  PVD (follows with vascular surgery).  He has a history of ICM with prior stroke and prior mural apical thrombus.  Also with persistent atrial fibrillation s/p prior DCCV in 2019.  He is on Xarelto.  Prior abdominal ultrasound in May 2006 showed no evidence of aneurysm.  LHC in 08/2011 showed LM 30, oLAD 100%, dLAD fills via RV branch off RCA, IM diffuse 95% subtotal disease, mCFX 40%, OM1 normal, RCA normal with large RV branch supplying dLAD, PLA possibly occluded and most dRCA appears to fill from LIMA collats; LIMA patent but does not supply dLAD (connects via small vessel to ? DRCA/PLA, S-IM occluded). Circulation felt to be stable. Ongoing medical management was recommended. Nuclear study in 2018 showed EF 46%, prior infarct but no evidence of ischemia.  He has a history of subdural hematoma in the setting of fall in April 2019.  He was diagnosed with right pelvic fracture at the time initially treated conservatively.  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  He has a history of hypertension/orthostasis.  Amiodarone was decreased to 100 mg daily in October 2018 in setting of dizziness, tremors, and constipation.  Echocardiogram in 08/2020 showed EF 45 to 50%, moderate RV dysfunction, mild LAE, severe RAE, mild MR, moderate TR, mild AS.  He underwent elective repeat DCCV for atrial flutter in 05/2021.  Most recent echocardiogram in 01/2022 showed EF 40 to 45%, mildly decreased LV function, G1 DD, BAE, to moderate mitral valve regurgitation, moderate to severe tricuspid valve regurgitation.  He was last seen in the office on 04/02/2022 by Dr. Martinique.  He noted increased shortness of breath, dizziness, and an episode of near syncope.  He was noted to have recurrent atrial flutter, HR 114 bpm.  He underwent repeat DCCV on 04/03/2022 with restoration of NSR.  He presents today for follow-up.  Since his procedure he has done well from a cardiac  standpoint.  He is maintaining NSR.  He monitors his heart rate and rhythm at home intermittently with a KardiaMobile device.  He denies any palpitations, venous, presyncope, syncope, dyspnea, edema, PND, orthopnea, weight gain, denies symptoms concerning for angina.  He notes that as previously discussed with Dr. [name], when he finishes his current Xarelto prescription, he will be transitioning to Eliquis 5 mg twice daily. Overall, he reports feeling well.  Home Medications    Current Outpatient Medications  Medication Sig Dispense Refill   alendronate (FOSAMAX) 70 MG tablet Take 70 mg by mouth every Sunday. Take with a full glass of water on an empty stomach.     amiodarone (PACERONE) 100 MG tablet Take 1 tablet (100 mg total) by mouth daily. 90 tablet 3   Cholecalciferol (VITAMIN D) 2000 UNITS tablet Take 2,000 Units by mouth daily.     ELIQUIS 5 MG TABS tablet Take 5 mg by mouth 2 (two) times daily.     fluticasone furoate-vilanterol (BREO ELLIPTA) 100-25 MCG/INH AEPB Inhale 1 puff into the lungs daily as needed (asthma).     furosemide (LASIX) 40 MG tablet TAKE 2 TABLETS ('80MG'$  TOTAL)TWO TIMES A DAY 360 tablet 0   latanoprost (XALATAN) 0.005 % ophthalmic solution Place 1 drop into the left eye at bedtime.     levothyroxine (SYNTHROID) 25 MCG tablet TAKE 1 TABLET DAILY BEFORE BREAKFAST 90 tablet 2   loratadine (CLARITIN) 10 MG tablet Take 10 mg by mouth daily.     Multiple Vitamin (MULTIVITAMIN WITH MINERALS) TABS tablet Take 1 tablet by mouth daily.     pravastatin (PRAVACHOL) 40 MG tablet TAKE 1 TABLET EVERY EVENING 90 tablet 3   Rivaroxaban (XARELTO) 15 MG TABS tablet Take 1 tablet (15 mg total) by mouth daily with supper.     tamsulosin (FLOMAX) 0.4 MG CAPS capsule Take 0.4 mg by mouth every evening.     No current facility-administered medications for this visit.     Review of Systems    He denies chest pain, palpitations, dyspnea, pnd, orthopnea, n, v, dizziness, syncope, edema,  weight gain, or early satiety. All other systems reviewed and are otherwise negative except as noted above.   Physical Exam    VS:  BP 116/60   Pulse 61   Ht '5\' 10"'$  (1.778 m)   Wt 163 lb 6.4 oz (74.1 kg)   PF 99 L/min   BMI 23.45 kg/m  GEN: Well nourished, well developed, in no acute distress. HEENT: normal. Neck: Supple, no JVD, carotid bruits, or masses. Cardiac: RRR, no murmurs, rubs, or gallops. No clubbing, cyanosis, edema.  Radials/DP/PT 2+ and equal bilaterally.  Respiratory:  Respirations regular and unlabored, clear to auscultation bilaterally. GI: Soft, nontender, nondistended, BS + x  4. MS: no deformity or atrophy. Skin: warm and dry, no rash. Neuro:  Strength and sensation are intact. Psych: Normal affect.  Accessory Clinical Findings    ECG personally reviewed by me today -NSR, 61 bpm, RBBB- no acute changes.   Lab Results  Component Value Date   WBC 6.4 04/02/2022   HGB 14.1 04/02/2022   HCT 43.3 04/02/2022   MCV 97 04/02/2022   PLT 237 04/02/2022   Lab Results  Component Value Date   CREATININE 1.15 04/02/2022   BUN 25 04/02/2022   NA 139 04/02/2022   K 4.1 04/02/2022   CL 97 04/02/2022   CO2 28 04/02/2022   Lab Results  Component Value Date   ALT 13 05/14/2021   AST 19 05/14/2021   ALKPHOS 63 05/14/2021   BILITOT 0.4 05/14/2021   Lab Results  Component Value Date   CHOL 129 05/14/2021   HDL 54 05/14/2021   LDLCALC 60 05/14/2021   TRIG 76 05/14/2021   CHOLHDL 2.4 05/14/2021    Lab Results  Component Value Date   HGBA1C 5.9 (H) 07/15/2017    Assessment & Plan    1. Persistent atrial fibrillation/atrial flutter: S/p repeat DCCV on 04/03/2022 for recurrent symptomatic atrial flutter.  Maintaining NSR.  He monitors his HR/rhythm at home with a KardiaMobile device.  He is on reduced dose amiodarone due to prior dizziness.  Continue amiodarone, Xarelto.  He will soon be transitioning to Eliquis per Dr. [name].   2. CAD: S/p CABG in 1990.   Most recent cath in 2013 stable overall. Nuclear study in 2018 showed EF 46%, prior infarct but no evidence of ischemia. Stable with no anginal symptoms. No indication for ischemic evaluation.  Continue pravastatin.  3. Chronic combined systolic and diastolic heart failure/ICM: Echo in 01/2022 showed EF 40 to 45%, mildly decreased LV function, G1 DD, BAE, to moderate mitral valve regurgitation, moderate to severe tricuspid valve regurgitation.  . Euvolemic and well compensated on exam.  Continue Lasix.  4. Valvular heart disease: Mild AS on prior echo, moderate to severe TR and mild to moderate MR on most recent echo in 01/2022.  Consider repeat echo fall 2024.  5. History of apical thrombus: Continue Xarelto (with transition to Eliquis as above).  6. PVD: History of right popliteal artery occlusion.  Has followed with vascular surgery.  Continue pravastatin.  7. Disposition: Follow-up in 3-4 months with Dr. [name].      Lenna Sciara, NP 04/18/2022, 10:29 AM

## 2022-04-18 ENCOUNTER — Encounter: Payer: Self-pay | Admitting: Nurse Practitioner

## 2022-04-21 DIAGNOSIS — C4442 Squamous cell carcinoma of skin of scalp and neck: Secondary | ICD-10-CM | POA: Diagnosis not present

## 2022-04-24 ENCOUNTER — Telehealth: Payer: Self-pay | Admitting: Cardiology

## 2022-04-24 MED ORDER — METOPROLOL TARTRATE 25 MG PO TABS: 25.0000 mg | ORAL_TABLET | Freq: Two times a day (BID) | ORAL | 3 refills | Status: DC

## 2022-04-24 NOTE — Telephone Encounter (Signed)
Spoke with patient about recommendations. He has taken metoprolol in the past and he reports dizzy spells. Allergy lists indicated "asthma exacerbations" which he reports he has. Will send this update/his concerns to MD

## 2022-04-24 NOTE — Telephone Encounter (Signed)
Spoke with patient of Dr. [name] who reports he is in AFib - he had a cardioversion on 1/5. Margaretha Seeds NP on 1/19 - he was in NSR. He reports feeling weak, woke up in the middle of the night and was dizzy going to bathroom. Did ECG with KardiaMobile that indicated possible AFib. He reports BP is 99/60 and HR 120 prior to call. His pulse ox currently is 69 but he feels like it is faster than this.   Medications: confirmed up to date - he is finishing out Xarelto then changing to Eliquis.   Advised will route to DOD for advice on recurrence of AFib post DCCV.    He also would like to know if he is a candidate for a pacemaker - sent to Dr. [name] (primary cardiologist)

## 2022-04-24 NOTE — Telephone Encounter (Signed)
Patient c/o Palpitations:  High priority if patient c/o lightheadedness, shortness of breath, or chest pain  How long have you had palpitations/irregular HR/ Afib? Are you having the symptoms now?   Yes, started this morning  Are you currently experiencing lightheadedness, SOB or CP?   SOB  Do you have a history of afib (atrial fibrillation) or irregular heart rhythm?   Yes  Have you checked your BP or HR? (document readings if available):  Yes  BP 99/60   HR 120  Are you experiencing any other symptoms?   Dizziness  Patient states he had his recharged recently.

## 2022-04-24 NOTE — Telephone Encounter (Signed)
Can't do dilt as EF is now. Would recommend to retry metoprolol.  [place] T. Audie Box, MD, [place] [address], [place], [place] [address] [phone] 10:45 AM

## 2022-04-24 NOTE — Telephone Encounter (Signed)
Geralynn Rile, MD  Fidel Levy, RN; Lelon Perla, MD Caller: Unspecified (Today,  8:37 AM) Start metoprolol tartrate 25 mg BID and follow-up with Dr. [name].  [place] Bells T. Audie Box, MD, [place] [address] [place], [place] [address] [phone] 9:04 AM

## 2022-04-24 NOTE — Telephone Encounter (Signed)
Patient called w/advice from MD. He is willing to re-try beta blocker. Rx(s) sent to pharmacy electronically. Scheduled him for OV on 2/1 with Raquel Sarna NP (same day that Dr. [name] is here).   Advised he can call on-call provider at our same number if concerns over weekend or after hours

## 2022-04-30 ENCOUNTER — Ambulatory Visit: Payer: Medicare Other | Attending: Nurse Practitioner | Admitting: Nurse Practitioner

## 2022-04-30 ENCOUNTER — Encounter: Payer: Self-pay | Admitting: Nurse Practitioner

## 2022-04-30 VITALS — BP 96/54 | HR 59 | Ht 70.0 in | Wt 166.4 lb

## 2022-04-30 DIAGNOSIS — I361 Nonrheumatic tricuspid (valve) insufficiency: Secondary | ICD-10-CM | POA: Diagnosis not present

## 2022-04-30 DIAGNOSIS — I340 Nonrheumatic mitral (valve) insufficiency: Secondary | ICD-10-CM | POA: Insufficient documentation

## 2022-04-30 DIAGNOSIS — I739 Peripheral vascular disease, unspecified: Secondary | ICD-10-CM | POA: Insufficient documentation

## 2022-04-30 DIAGNOSIS — I5042 Chronic combined systolic (congestive) and diastolic (congestive) heart failure: Secondary | ICD-10-CM | POA: Diagnosis not present

## 2022-04-30 DIAGNOSIS — I4819 Other persistent atrial fibrillation: Secondary | ICD-10-CM | POA: Insufficient documentation

## 2022-04-30 DIAGNOSIS — I255 Ischemic cardiomyopathy: Secondary | ICD-10-CM | POA: Diagnosis not present

## 2022-04-30 DIAGNOSIS — I4892 Unspecified atrial flutter: Secondary | ICD-10-CM | POA: Diagnosis not present

## 2022-04-30 DIAGNOSIS — I2510 Atherosclerotic heart disease of native coronary artery without angina pectoris: Secondary | ICD-10-CM | POA: Insufficient documentation

## 2022-04-30 DIAGNOSIS — I350 Nonrheumatic aortic (valve) stenosis: Secondary | ICD-10-CM | POA: Insufficient documentation

## 2022-04-30 MED ORDER — METOPROLOL TARTRATE 25 MG PO TABS: 12.5000 mg | ORAL_TABLET | Freq: Two times a day (BID) | ORAL | 3 refills | Status: DC

## 2022-04-30 NOTE — Progress Notes (Signed)
Office Visit    Patient Name: Donald [name] Date of Encounter: 04/30/2022  Primary Care Provider:  Haywood Pao, MD Primary Cardiologist:  Kirk Ruths, MD  Chief Complaint    89 year old male with a history of persistent atrial fibrillation, CAD s/p CABG in 1990, chronic combined systolic and diastolic heart failure/ICM, apical thrombus, aortic stenosis, MR, TR, mild carotid artery stenosis, and PVD (follows with vascular surgery) who presents for follow-up related to atrial fibrillation.  Past Medical History    Past Medical History:  Diagnosis Date   Asthma    with worsening with beta blockade   Atrial fibrillation (HCC)    CAD (coronary artery disease)    CVA (cerebral infarction) 2012   tia, mild no residual defecits   GI bleed 8 yrs ago   due to doll fully vessel which was clipped   Glaucoma    excellent cataracts   History of kidney stones    Ischemic cardiomyopathy    Moderate to severe mitral regurgitation 06/16/2017   Nephrolithiasis    Post-infarction apical thrombus [facility])    Prostate cancer [facility]) 2012   Radiation proctitis Feb 2015   treated with APC   Tubular adenoma 04/2013   Dr. Hilarie Fredrickson   Past Surgical History:  Procedure Laterality Date   cardiac stents  2003   CARDIOVERSION N/A 06/15/2017   Procedure: CARDIOVERSION;  Surgeon: Acie Fredrickson Wonda Cheng, MD;  Location: [place];  Service: Cardiovascular;  Laterality: N/A;   CARDIOVERSION N/A 07/15/2017   Procedure: CARDIOVERSION;  Surgeon: Jerline Pain, MD;  Location: [place];  Service: Cardiovascular;  Laterality: N/A;   CARDIOVERSION N/A 07/20/2017   Procedure: CARDIOVERSION;  Surgeon: Herminio Commons, MD;  Location: [facility] ENDOSCOPY;  Service: Cardiovascular;  Laterality: N/A;   CARDIOVERSION N/A 11/23/2017   Procedure: CARDIOVERSION;  Surgeon: Lelon Perla, MD;  Location: [facility] ENDOSCOPY;  Service: Cardiovascular;  Laterality: N/A;   CARDIOVERSION N/A 06/03/2021   Procedure: CARDIOVERSION;   Surgeon: [name], MD;  Location: [facility] ENDOSCOPY;  Service: Cardiovascular;  Laterality: N/A;   CARDIOVERSION N/A 04/03/2022   Procedure: CARDIOVERSION;  Surgeon: Sanda Klein, MD;  Location: [place];  Service: Cardiovascular;  Laterality: N/A;   COLONOSCOPY WITH PROPOFOL N/A 05/05/2013   Procedure: COLONOSCOPY WITH PROPOFOL;  Surgeon: Jerene Bears, MD;  Location: [facility] ENDOSCOPY;  Service: Gastroenterology;  Laterality: N/A;   CORONARY ARTERY BYPASS GRAFT  1998   LIMA to the LAD and saphenous vein graft tothe diagonal   EXTRACORPOREAL SHOCK WAVE LITHOTRIPSY Right 04/29/2017   Procedure: RIGHT EXTRACORPOREAL SHOCK WAVE LITHOTRIPSY (ESWL);  Surgeon: Alexis Frock, MD;  Location: [facility] ORS;  Service: Urology;  Laterality: Right;   history of radiation treatment  2012   x 40 treatments done   KNEE ARTHROSCOPY  2016 or 2017   Dr Gladstone Lighter ;    PARTIAL KNEE ARTHROPLASTY Right 01/13/2017   Procedure: UNICOMPARTMENTAL RIGHT KNEE;  Surgeon: Gaynelle Arabian, MD;  Location: [facility];  Service: Orthopedics;  Laterality: Right;  with block    Allergies  Allergies  Allergen Reactions   Beta Adrenergic Blockers Other (See Comments)    Other reaction(s): asthma exacerbation   Keflex [Cephalexin] Nausea And Vomiting and Other (See Comments)    'Killed all GI enzymes" (also) and patient prefers to not take this   Morphine Nausea Only     Labs/Other Studies Reviewed    The following studies were reviewed today: Echo 02/11/2022: IMPRESSIONS    1. 3D EF - overestimated.  Left ventricular ejection fraction, by  estimation, is 40 to 45%. Left ventricular ejection fraction by 3D volume  is 60 %. The left ventricle has mildly decreased function. The left  ventricle demonstrates regional wall motion  abnormalities (see scoring diagram/findings for description). Left  ventricular diastolic parameters are consistent with Grade I diastolic  dysfunction (impaired relaxation). There is severe  akinesis of the left  ventricular, mid-apical anteroseptal wall  and septal wall. There is severe akinesis of the left ventricular, apical  lateral wall and inferolateral wall.   2. Right ventricular systolic function is normal. The right ventricular  size is normal.   3. Left atrial size was moderately dilated.   4. Right atrial size was severely dilated.   5. The mitral valve is normal in structure. Mild to moderate mitral valve  regurgitation. No evidence of mitral stenosis.   6. Tricuspid valve regurgitation is moderate to severe.   7. The aortic valve is calcified. There is mild calcification of the  aortic valve. There is mild thickening of the aortic valve. Aortic valve  regurgitation is not visualized. Aortic valve sclerosis/calcification is  present, without any evidence of  aortic stenosis.   8. The inferior vena cava is normal in size with greater than 50%  respiratory variability, suggesting right atrial pressure of 3 mmHg.   Comparison(s): EF 45%, moderate TR, mild AS mean 5, peak 8 mmHg, RV  function decreased, akinesis of the left ventricular, apical anteroseptal  wall, inferoseptal wall, anterior wall, inferior wall, anterolateral wall  and inferolateral w  Recent Labs: 05/14/2021: ALT 13; TSH 7.690 04/02/2022: BUN 25; Creatinine, Ser 1.15; Hemoglobin 14.1; Platelets 237; Potassium 4.1; Sodium 139  Recent Lipid Panel    Component Value Date/Time   CHOL 129 05/14/2021 0957   TRIG 76 05/14/2021 0957   TRIG 44 08/13/2008 0000   HDL 54 05/14/2021 0957   CHOLHDL 2.4 05/14/2021 0957   CHOLHDL 3.2 07/15/2017 2329   VLDL 10 07/15/2017 2329   [place] 60 05/14/2021 0957    History of Present Illness    89 year old male with the above past medical history including persistent atrial fibrillation, CAD s/p CABG in 1990, chronic combined systolic and diastolic heart failure/ICM, apical thrombus, mild aortic stenosis, MR, TR, mild carotid artery stenosis, and PVD (follows with  vascular surgery).   He has a history of ICM with prior stroke and prior mural apical thrombus.  Also with persistent atrial fibrillation s/p prior DCCV in 2019.  He is on Xarelto.  Prior abdominal ultrasound in May 2006 showed no evidence of aneurysm.  LHC in 08/2011 showed LM 30, oLAD 100%, dLAD fills via RV branch off RCA, IM diffuse 95% subtotal disease, mCFX 40%, OM1 normal, RCA normal with large RV branch supplying dLAD, PLA possibly occluded and most dRCA appears to fill from LIMA collats; LIMA patent but does not supply dLAD (connects via small vessel to ? DRCA/PLA, S-IM occluded). Circulation felt to be stable. Ongoing medical management was recommended. Nuclear study in 2018 showed EF 46%, prior infarct but no evidence of ischemia.  He has a history of subdural hematoma in the setting of fall in April 2019.  He was diagnosed with right pelvic fracture at the time initially treated conservatively.  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  He has a history of hypertension/orthostasis.  Amiodarone was decreased to 100 mg daily in October 2018 in setting of dizziness, tremors, and constipation.  Echocardiogram in 08/2020 showed EF 45 to 50%, moderate RV dysfunction, mild LAE, severe RAE, mild MR, moderate TR, mild AS.  He underwent elective repeat DCCV for atrial flutter in 05/2021.  Most recent echocardiogram in 01/2022 showed EF 40 to 45%, mildly decreased LV function, G1 DD, BAE, to moderate mitral valve regurgitation, moderate to severe tricuspid valve regurgitation.  He was seen in follow-up on 04/02/2022 and noted increased shortness of breath, dizziness, and an episode of near syncope.  He was noted to have recurrent atrial flutter, HR 114 bpm.  He underwent repeat DCCV on 04/03/2022 with restoration of NSR.  He was last seen in the office on 04/17/2022 and was stable from a cardiac standpoint.  He was maintaining NSR.  Contacted our  office on 04/24/2022 with concern for recurrent atrial fibrillation, associated weakness and dizziness.  He was restarted on metoprolol to tartrate 25 mg twice daily.   He presents today for follow-up.  Since his last visit he has done well from a cardiac standpoint. He notes he converted to sinus rhythm this morning (he continues to monitor his HR/rhythm with his KardiaMobile device).  He is feeling much better.  He has noted some intermittent lightheadedness as well as low BP.  Otherwise, he reports feeling well.  Home Medications    Current Outpatient Medications  Medication Sig Dispense Refill   alendronate (FOSAMAX) 70 MG tablet Take 70 mg by mouth every Sunday. Take with a full glass of water on an empty stomach.     amiodarone (PACERONE) 100 MG tablet Take 1 tablet (100 mg total) by mouth daily. 90 tablet 3   Cholecalciferol (VITAMIN D) 2000 UNITS tablet Take 2,000 Units by mouth daily.     ELIQUIS 5 MG TABS tablet Take 5 mg by mouth 2 (two) times daily.     fluticasone furoate-vilanterol (BREO ELLIPTA) 100-25 MCG/INH AEPB Inhale 1 puff into the lungs daily as needed (asthma).     furosemide (LASIX) 40 MG tablet TAKE 2 TABLETS ('80MG'$  TOTAL)TWO TIMES A DAY 360 tablet 0   latanoprost (XALATAN) 0.005 % ophthalmic solution Place 1 drop into the left eye at bedtime.     levothyroxine (SYNTHROID) 25 MCG tablet TAKE 1 TABLET DAILY BEFORE BREAKFAST 90 tablet 2   loratadine (CLARITIN) 10 MG tablet Take 10 mg by mouth daily.     metoprolol tartrate (LOPRESSOR) 25 MG tablet Take 1 tablet (25 mg total) by mouth 2 (two) times daily. 180 tablet 3   Multiple Vitamin (MULTIVITAMIN WITH MINERALS) TABS tablet Take 1 tablet by mouth daily.     pravastatin (PRAVACHOL) 40 MG tablet TAKE 1 TABLET EVERY EVENING 90 tablet 3   Rivaroxaban (XARELTO) 15 MG TABS tablet Take 1 tablet (15 mg total) by mouth daily with supper.     tamsulosin (FLOMAX) 0.4 MG CAPS capsule Take 0.4 mg by mouth every evening.     No  current facility-administered medications for this visit.     Review of Systems    He denies chest pain, palpitations, dyspnea, pnd, orthopnea, n, v, dizziness, syncope, edema, weight gain, or early satiety. All other systems reviewed and are otherwise negative except as noted above.   Physical Exam    VS:  BP (!) 96/54 (BP Location: Left Arm, Patient Position: Sitting, Cuff Size: Normal)   Pulse (!) 59   Ht '5\' 10"'$  (1.778 m)   Wt 166 lb 6.4 oz (75.5 kg)   SpO2 95%   BMI 23.88 kg/m  GEN: Well  nourished, well developed, in no acute distress. HEENT: normal. Neck: Supple, no JVD, carotid bruits, or masses. Cardiac: RRR, 3/6 murmur, no rubs, or gallops. No clubbing, cyanosis, edema.  Radials/DP/PT 2+ and equal bilaterally.  Respiratory:  Respirations regular and unlabored, clear to auscultation bilaterally. GI: Soft, nontender, nondistended, BS + x 4. MS: no deformity or atrophy. Skin: warm and dry, no rash. Neuro:  Strength and sensation are intact. Psych: Normal affect.  Accessory Clinical Findings    ECG personally reviewed by me today -sinus bradycardia, 59 bpm, RBBB- no acute changes.   Lab Results  Component Value Date   WBC 6.4 04/02/2022   HGB 14.1 04/02/2022   HCT 43.3 04/02/2022   MCV 97 04/02/2022   PLT 237 04/02/2022   Lab Results  Component Value Date   CREATININE 1.15 04/02/2022   BUN 25 04/02/2022   NA 139 04/02/2022   K 4.1 04/02/2022   CL 97 04/02/2022   CO2 28 04/02/2022   Lab Results  Component Value Date   ALT 13 05/14/2021   AST 19 05/14/2021   ALKPHOS 63 05/14/2021   BILITOT 0.4 05/14/2021   Lab Results  Component Value Date   CHOL 129 05/14/2021   HDL 54 05/14/2021   LDLCALC 60 05/14/2021   TRIG 76 05/14/2021   CHOLHDL 2.4 05/14/2021    Lab Results  Component Value Date   HGBA1C 5.9 (H) 07/15/2017    Assessment & Plan    1. Persistent atrial fibrillation/atrial flutter: S/p repeat DCCV on 04/03/2022 for recurrent symptomatic atrial  flutter.  He did have early recurrence of a fib post DCCV.  Fortunately, he converted to NSR today.  Pending NSR on EKG today.  He has had noted some lightheadedness, borderline low BP.  Will decrease metoprolol to 12.5 mg twice daily.  I suspect if he has recurrent atrial fibrillation/flutter he would benefit from EP referral as he has not tolerated higher doses of amiodarone or beta-blocker and is very symptomatic is in A-fib. He will continue to monitor his HR/rhythm at home with a KardiaMobile device.  We reviewed how to upload a tracing to MyChart for future reference.  Continue amiodarone at reduced dose, metoprolol and  Xarelto. Will update TSH, LFTs (recent CBC, BMET wnl). He will soon be transitioning to Eliquis per Dr. [name].    2. CAD: S/p CABG in 1990.  Most recent cath in 2013 stable overall. Nuclear study in 2018 showed EF 46%, prior infarct but no evidence of ischemia. Stable with no anginal symptoms. No indication for ischemic evaluation.  Continue pravastatin.   3. Chronic combined systolic and diastolic heart failure/ICM: Echo in 01/2022 showed EF 40 to 45%, mildly decreased LV function, G1 DD, BAE, to moderate mitral valve regurgitation, moderate to severe tricuspid valve regurgitation.  Euvolemic and well compensated on exam.  Continue Lasix.   4. Valvular heart disease: Mild AS on prior echo, moderate to severe TR and mild to moderate MR on most recent echo in 01/2022.  Consider repeat echo fall 2024.   5. History of apical thrombus: Continue Xarelto (with transition to Eliquis as above).   6. PVD: History of right popliteal artery occlusion.  Has followed with vascular surgery.  Continue pravastatin.   7. Disposition: Follow-up as scheduled with Dr. [name] in 06/2022.      Lenna Sciara, NP 04/30/2022, 1:54 PM

## 2022-04-30 NOTE — Patient Instructions (Signed)
Medication Instructions:  Decrease Metoprolol 12.5 mg twice daily  *If you need a refill on your cardiac medications before your next appointment, please call your pharmacy*   Lab Work: Your physician recommends that you complete lab work today. TSH, LFTs   If you have labs (blood work) drawn today and your tests are completely normal, you will receive your results only by: [place] (if you have MyChart) OR A paper copy in the mail If you have any lab test that is abnormal or we need to change your treatment, we will call you to review the results.   Testing/Procedures: NONE ordered at this time of appointment     Follow-Up: At [facility], you and your health needs are our priority.  As part of our continuing mission to provide you with exceptional heart care, we have created designated Provider Care Teams.  These Care Teams include your primary Cardiologist (physician) and Advanced Practice Providers (APPs -  Physician Assistants and Nurse Practitioners) who all work together to provide you with the care you need, when you need it.  We recommend signing up for the patient portal called "MyChart".  Sign up information is provided on this After Visit Summary.  MyChart is used to connect with patients for Virtual Visits (Telemedicine).  Patients are able to view lab/test results, encounter notes, upcoming appointments, etc.  Non-urgent messages can be sent to your provider as well.   To learn more about what you can do with MyChart, go to [email or web address].    Your next appointment:    Keep Follow up   Provider:   Kirk Ruths, MD     Other Instructions

## 2022-05-01 ENCOUNTER — Encounter: Payer: Self-pay | Admitting: Nurse Practitioner

## 2022-05-01 LAB — HEPATIC FUNCTION PANEL
ALT: 27 IU/L (ref 0–44)
AST: 29 IU/L (ref 0–40)
Albumin: 3.5 g/dL — ABNORMAL LOW (ref 3.6–4.6)
Alkaline Phosphatase: 62 IU/L (ref 44–121)
Bilirubin Total: 0.3 mg/dL (ref 0.0–1.2)
Bilirubin, Direct: 0.14 mg/dL (ref 0.00–0.40)
Total Protein: 6.2 g/dL (ref 6.0–8.5)

## 2022-05-01 LAB — TSH: TSH: 2.98 u[IU]/mL (ref 0.450–4.500)

## 2022-05-11 ENCOUNTER — Encounter: Payer: Self-pay | Admitting: Cardiology

## 2022-05-11 MED ORDER — FUROSEMIDE 40 MG PO TABS: ORAL_TABLET | ORAL | 3 refills | Status: DC

## 2022-05-27 ENCOUNTER — Telehealth: Payer: Self-pay | Admitting: Cardiology

## 2022-05-27 MED ORDER — FUROSEMIDE 40 MG PO TABS: ORAL_TABLET | ORAL | 2 refills | Status: DC

## 2022-05-27 NOTE — Telephone Encounter (Signed)
Pt c/o medication issue:  1. Name of Medication: furosemide (LASIX) 40 MG tablet   2. How are you currently taking this medication (dosage and times per day)?   3. Are you having a reaction (difficulty breathing--STAT)?   4. What is your medication issue? Pt is a little confused on how he is supposed to be taking. He is not sure if he should be taking 2x a day or 4x a day. He states the pharmacy gave him the wrong prescription. Please advise.

## 2022-05-27 NOTE — Telephone Encounter (Signed)
Called pt to discuss medication. Pt is to take 2 tablets twice a day. He only received 90 tablets. Pt needs more tablets. Prescription updated and resent to pharmacy. Pt thankful for the call back.

## 2022-06-10 DIAGNOSIS — N1831 Chronic kidney disease, stage 3a: Secondary | ICD-10-CM | POA: Diagnosis not present

## 2022-06-10 DIAGNOSIS — I130 Hypertensive heart and chronic kidney disease with heart failure and stage 1 through stage 4 chronic kidney disease, or unspecified chronic kidney disease: Secondary | ICD-10-CM | POA: Diagnosis not present

## 2022-06-10 DIAGNOSIS — I679 Cerebrovascular disease, unspecified: Secondary | ICD-10-CM | POA: Diagnosis not present

## 2022-06-10 DIAGNOSIS — M5430 Sciatica, unspecified side: Secondary | ICD-10-CM | POA: Diagnosis not present

## 2022-06-10 DIAGNOSIS — I509 Heart failure, unspecified: Secondary | ICD-10-CM | POA: Diagnosis not present

## 2022-06-10 DIAGNOSIS — I480 Paroxysmal atrial fibrillation: Secondary | ICD-10-CM | POA: Diagnosis not present

## 2022-06-10 DIAGNOSIS — M810 Age-related osteoporosis without current pathological fracture: Secondary | ICD-10-CM | POA: Diagnosis not present

## 2022-06-10 DIAGNOSIS — E039 Hypothyroidism, unspecified: Secondary | ICD-10-CM | POA: Diagnosis not present

## 2022-06-10 DIAGNOSIS — R7301 Impaired fasting glucose: Secondary | ICD-10-CM | POA: Diagnosis not present

## 2022-06-10 DIAGNOSIS — Z7901 Long term (current) use of anticoagulants: Secondary | ICD-10-CM | POA: Diagnosis not present

## 2022-06-10 DIAGNOSIS — Z9861 Coronary angioplasty status: Secondary | ICD-10-CM | POA: Diagnosis not present

## 2022-06-10 DIAGNOSIS — J449 Chronic obstructive pulmonary disease, unspecified: Secondary | ICD-10-CM | POA: Diagnosis not present

## 2022-07-07 ENCOUNTER — Telehealth: Payer: Self-pay | Admitting: Cardiology

## 2022-07-07 NOTE — Telephone Encounter (Signed)
*  STAT* If patient is at the pharmacy, call can be transferred to refill team.   1. Which medications need to be refilled? (please list name of each medication and dose if known) amiodarone (PACERONE) 100 MG tablet   Take 1 tablet (100 mg total) by mouth daily.    2. Which pharmacy/location (including street and city if local pharmacy) is medication to be sent to? [facility] - [place], [place] - [address] [facility] AT Portal to Registered [facility] Sites    3. Do they need a 30 day or 90 day supply? 90 Day Supply

## 2022-07-09 ENCOUNTER — Other Ambulatory Visit: Payer: Self-pay

## 2022-07-09 MED ORDER — AMIODARONE HCL 100 MG PO TABS: 100.0000 mg | ORAL_TABLET | Freq: Every day | ORAL | 3 refills | Status: DC

## 2022-07-09 NOTE — Telephone Encounter (Signed)
Spoke with the patient and he stated that his bottle states that he has 3 refills but [facility] can't find the script. I sent him in a new script with enough refills for a year. He gave a verbal understanding.

## 2022-07-13 NOTE — Progress Notes (Signed)
HPI: FU CAD, CHF and atrial fibrillation. He is status post coronary bypassing graft in 1990; also with h/o ischemic cardiomyopathy, prior stroke and prior mural apical thrombus. Prior abdominal ultrasound in May 2006 showed no aneurysm. Carotid Dopplers in November of 2005 showed 0-39% bilaterally.  LHC 09/24/11: LM 30, oLAD 100%, dLAD fills via RV branch off RCA, IM diffuse 95% subtotal disease, mCFX 40%, OM1 normal, RCA normal with large RV branch supplying dLAD, PLA possibly occluded and most dRCA appears to fill from LIMA collats; LIMA patent but does not supply dLAD (connects via small vessel to ? DRCA/PLA, S-IM occluded). Circulation felt to be stable. Continue med Rx recommended. Nuclear study 2018 showed ejection fraction 46%. There was infarct but no ischemia.  Patient had a fall in April 2019 and suffered a small subdural hematoma.  He was ultimately also diagnosed with right pelvic fracture treated conservatively. CTA showed occlusion of the right popliteal artery of indeterminate chronicity; followed by vascular surgery.  Patient also with hypotension/orthostasis. Amiodarone decreased to 100 mg daily October 2019 as patient complained of dizziness, tremors, and constipation. Echocardiogram November 2023 showed ejection fraction 40 to 45%, grade 1 diastolic dysfunction, moderate left atrial enlargement, severe right atrial enlargement, mild to moderate mitral regurgitation, moderate to severe tricuspid regurgitation.  Had cardioversion of atrial flutter January 2024.  Since last seen, the patient has dyspnea with more extreme activities but not with routine activities. It is relieved with rest. It is not associated with chest pain. There is no orthopnea, PND or pedal edema. There is no syncope or palpitations. There is no exertional chest pain.   Current Outpatient Medications  Medication Sig Dispense Refill   alendronate (FOSAMAX) 70 MG tablet Take 70 mg by mouth every Sunday. Take with a  full glass of water on an empty stomach.     amiodarone (PACERONE) 100 MG tablet Take 1 tablet (100 mg total) by mouth daily. 90 tablet 3   Cholecalciferol (VITAMIN D) 2000 UNITS tablet Take 2,000 Units by mouth daily.     ELIQUIS 5 MG TABS tablet Take 5 mg by mouth 2 (two) times daily.     fluticasone furoate-vilanterol (BREO ELLIPTA) 100-25 MCG/INH AEPB Inhale 1 puff into the lungs daily as needed (asthma).     furosemide (LASIX) 40 MG tablet TAKE 2 TABLETS (  TOTAL)TWO TIMES A DAY 360 tablet 2   latanoprost (XALATAN) 0.005 % ophthalmic solution Place 1 drop into the left eye at bedtime.     levothyroxine (SYNTHROID) 25 MCG tablet TAKE 1 TABLET DAILY BEFORE BREAKFAST 90 tablet 2   loratadine (CLARITIN) 10 MG tablet Take 10 mg by mouth daily.     metoprolol tartrate (LOPRESSOR) 25 MG tablet Take 0.5 tablets (12.5 mg total) by mouth 2 (two) times daily. 90 tablet 3   Multiple Vitamin (MULTIVITAMIN WITH MINERALS) TABS tablet Take 1 tablet by mouth daily.     pravastatin (PRAVACHOL) 40 MG tablet TAKE 1 TABLET EVERY EVENING 90 tablet 3   tamsulosin (FLOMAX) 0.4 MG CAPS capsule Take 0.4 mg by mouth every evening.     No current facility-administered medications for this visit.     Past Medical History:  Diagnosis Date   Asthma    with worsening with beta blockade   Atrial fibrillation    CAD (coronary artery disease)    CVA (cerebral infarction) 2012   tia, mild no residual defecits   GI bleed 8 yrs ago   due to doll fully  vessel which was clipped   Glaucoma    excellent cataracts   History of kidney stones    Ischemic cardiomyopathy    Moderate to severe mitral regurgitation 06/16/2017   Nephrolithiasis    Post-infarction apical thrombus    Prostate cancer 2012   Radiation proctitis Feb 2015   treated with APC   Tubular adenoma 04/2013   Dr. Rhea Belton    Past Surgical History:  Procedure Laterality Date   cardiac stents  2003   CARDIOVERSION N/A 06/15/2017   Procedure:  CARDIOVERSION;  Surgeon: Elease Hashimoto, Deloris Ping, MD;  Location: [facility] ENDOSCOPY;  Service: Cardiovascular;  Laterality: N/A;   CARDIOVERSION N/A 07/15/2017   Procedure: CARDIOVERSION;  Surgeon: Jake Bathe, MD;  Location: [facility] ENDOSCOPY;  Service: Cardiovascular;  Laterality: N/A;   CARDIOVERSION N/A 07/20/2017   Procedure: CARDIOVERSION;  Surgeon: Laqueta Linden, MD;  Location: [facility] ENDOSCOPY;  Service: Cardiovascular;  Laterality: N/A;   CARDIOVERSION N/A 11/23/2017   Procedure: CARDIOVERSION;  Surgeon: Lewayne Bunting, MD;  Location: [facility] ENDOSCOPY;  Service: Cardiovascular;  Laterality: N/A;   CARDIOVERSION N/A 06/03/2021   Procedure: CARDIOVERSION;  Surgeon: Little Ishikawa, MD;  Location: [facility] ENDOSCOPY;  Service: Cardiovascular;  Laterality: N/A;   CARDIOVERSION N/A 04/03/2022   Procedure: CARDIOVERSION;  Surgeon: Thurmon Fair, MD;  Location: [facility] ENDOSCOPY;  Service: Cardiovascular;  Laterality: N/A;   COLONOSCOPY WITH PROPOFOL N/A 05/05/2013   Procedure: COLONOSCOPY WITH PROPOFOL;  Surgeon: Beverley Fiedler, MD;  Location: [facility] ENDOSCOPY;  Service: Gastroenterology;  Laterality: N/A;   CORONARY ARTERY BYPASS GRAFT  1998   LIMA to the LAD and saphenous vein graft tothe diagonal   EXTRACORPOREAL SHOCK WAVE LITHOTRIPSY Right 04/29/2017   Procedure: RIGHT EXTRACORPOREAL SHOCK WAVE LITHOTRIPSY (ESWL);  Surgeon: Sebastian Ache, MD;  Location: [facility] ORS;  Service: Urology;  Laterality: Right;   history of radiation treatment  2012   x 40 treatments done   KNEE ARTHROSCOPY  2016 or 2017   Dr Darrelyn Hillock ;    PARTIAL KNEE ARTHROPLASTY Right 01/13/2017   Procedure: UNICOMPARTMENTAL RIGHT KNEE;  Surgeon: Ollen Gross, MD;  Location: [facility] ORS;  Service: Orthopedics;  Laterality: Right;  with block    Social History   Socioeconomic History   Marital status: Widowed    Spouse name: Not on file   Number of children: 3   Years of education: Not on file   Highest education level: Not on file  Occupational  History   Occupation: Retired Equities trader  Tobacco Use   Smoking status: Never   Smokeless tobacco: Never  Vaping Use   Vaping Use: Never used  Substance and Sexual Activity   Alcohol use: Yes    Alcohol/week: 0.0 standard drinks of alcohol    Comment: occasional beer or wine   Drug use: No   Sexual activity: Not Currently    Birth control/protection: Abstinence  Other Topics Concern   Not on file  Social History Narrative   Not on file   Social Determinants of Health   Financial Resource Strain: Not on file  Food Insecurity: Not on file  Transportation Needs: Not on file  Physical Activity: Not on file  Stress: Not on file  Social Connections: Not on file  Intimate Partner Violence: Not on file    Family History  Problem Relation Age of Onset   Heart attack Father    Heart disease Mother    Stomach cancer Mother     ROS: no fevers or chills, productive cough,  hemoptysis, dysphasia, odynophagia, melena, hematochezia, dysuria, hematuria, rash, seizure activity, orthopnea, PND, pedal edema, claudication. Remaining systems are negative.  Physical Exam: Well-developed well-nourished in no acute distress.  Skin is warm and dry.  HEENT is normal.  Neck is supple.  Chest is clear to auscultation with normal expansion.  Cardiovascular exam is regular rate and rhythm.  Abdominal exam nontender or distended. No masses palpated. Extremities show no edema. neuro grossly intact   A/P  1 paroxysmal atrial fibrillation/flutter-patient remains in sinus rhythm on exam.  Continue amiodarone and apixaban at present dose.  Check chest x-ray.  2 chronic combined systolic/diastolic congestive heart failure-he remains euvolemic on examination.  Continue diuretic at present dose.  3 coronary artery disease-he is doing well with no chest pain.  Continue statin.  No aspirin given need for apixaban.  4 ischemic cardiomyopathy-patient is not on an ARB or Entresto as he has had  significant problems with orthostasis in the past.  Discontinue metoprolol and treat with Toprol 25 mg daily.  5 hyperlipidemia-continue statin.  6 history of apical thrombus-continue apixaban.  7 valvular heart disease-mild to moderate mitral regurgitation and moderate to severe tricuspid regurgitation on recent echocardiogram.  Would like to be conservative given patient's age.  8 peripheral vascular disease-Per vascular surgery.  Olga Millers, MD

## 2022-07-22 DIAGNOSIS — H349 Unspecified retinal vascular occlusion: Secondary | ICD-10-CM | POA: Diagnosis not present

## 2022-07-22 DIAGNOSIS — H401122 Primary open-angle glaucoma, left eye, moderate stage: Secondary | ICD-10-CM | POA: Diagnosis not present

## 2022-07-23 ENCOUNTER — Ambulatory Visit: Payer: Medicare Other | Attending: Cardiology | Admitting: Cardiology

## 2022-07-23 ENCOUNTER — Encounter: Payer: Self-pay | Admitting: Cardiology

## 2022-07-23 VITALS — BP 118/60 | HR 60 | Ht 70.0 in | Wt 167.8 lb

## 2022-07-23 DIAGNOSIS — I255 Ischemic cardiomyopathy: Secondary | ICD-10-CM | POA: Diagnosis not present

## 2022-07-23 DIAGNOSIS — I361 Nonrheumatic tricuspid (valve) insufficiency: Secondary | ICD-10-CM | POA: Diagnosis not present

## 2022-07-23 DIAGNOSIS — I2510 Atherosclerotic heart disease of native coronary artery without angina pectoris: Secondary | ICD-10-CM

## 2022-07-23 DIAGNOSIS — I4819 Other persistent atrial fibrillation: Secondary | ICD-10-CM

## 2022-07-23 DIAGNOSIS — I5042 Chronic combined systolic (congestive) and diastolic (congestive) heart failure: Secondary | ICD-10-CM

## 2022-07-23 DIAGNOSIS — I4892 Unspecified atrial flutter: Secondary | ICD-10-CM | POA: Diagnosis not present

## 2022-07-23 MED ORDER — METOPROLOL SUCCINATE ER 25 MG PO TB24: 25.0000 mg | ORAL_TABLET | Freq: Every day | ORAL | 3 refills | Status: DC

## 2022-07-23 NOTE — Patient Instructions (Signed)
Medication Instructions:   STOP METOPROLOL TARTRATE  START METOPROLOL SUCC ER 25 MG ONCE DAILY AT BEDTIME  *If you need a refill on your cardiac medications before your next appointment, please call your pharmacy*  Testing/Procedures:  A chest x-ray takes a picture of the organs and structures inside the chest, including the heart, lungs, and blood vessels. This test can show several things, including, whether the heart is enlarges; whether fluid is building up in the lungs; and whether pacemaker / defibrillator leads are still in place. [facility] =[address]   Follow-Up: At [facility], you and your health needs are our priority.  As part of our continuing mission to provide you with exceptional heart care, we have created designated Provider Care Teams.  These Care Teams include your primary Cardiologist (physician) and Advanced Practice Providers (APPs -  Physician Assistants and Nurse Practitioners) who all work together to provide you with the care you need, when you need it.  We recommend signing up for the patient portal called "MyChart".  Sign up information is provided on this After Visit Summary.  MyChart is used to connect with patients for Virtual Visits (Telemedicine).  Patients are able to view lab/test results, encounter notes, upcoming appointments, etc.  Non-urgent messages can be sent to your provider as well.   To learn more about what you can do with MyChart, go to [email or web address].    Your next appointment:   6 month(s)  Provider:   Olga Millers, MD

## 2022-07-27 ENCOUNTER — Ambulatory Visit
Admission: RE | Admit: 2022-07-27 | Discharge: 2022-07-27 | Disposition: A | Discharge status: Home / Self Care | Payer: Medicare Other | Source: Ambulatory Visit | Attending: Cardiology | Admitting: Cardiology

## 2022-07-27 DIAGNOSIS — Z79899 Other long term (current) drug therapy: Secondary | ICD-10-CM | POA: Diagnosis not present

## 2022-07-27 DIAGNOSIS — I4819 Other persistent atrial fibrillation: Secondary | ICD-10-CM

## 2022-07-27 DIAGNOSIS — J439 Emphysema, unspecified: Secondary | ICD-10-CM | POA: Diagnosis not present

## 2022-08-03 ENCOUNTER — Encounter: Payer: Self-pay | Admitting: *Deleted

## 2022-10-27 DIAGNOSIS — M1811 Unilateral primary osteoarthritis of first carpometacarpal joint, right hand: Secondary | ICD-10-CM | POA: Diagnosis not present

## 2022-11-04 DIAGNOSIS — M542 Cervicalgia: Secondary | ICD-10-CM | POA: Diagnosis not present

## 2022-11-19 DIAGNOSIS — M545 Low back pain, unspecified: Secondary | ICD-10-CM | POA: Diagnosis not present

## 2022-11-19 DIAGNOSIS — M47896 Other spondylosis, lumbar region: Secondary | ICD-10-CM | POA: Diagnosis not present

## 2022-11-26 DIAGNOSIS — R7301 Impaired fasting glucose: Secondary | ICD-10-CM | POA: Diagnosis not present

## 2022-11-26 DIAGNOSIS — I509 Heart failure, unspecified: Secondary | ICD-10-CM | POA: Diagnosis not present

## 2022-11-26 DIAGNOSIS — Z125 Encounter for screening for malignant neoplasm of prostate: Secondary | ICD-10-CM | POA: Diagnosis not present

## 2022-11-26 DIAGNOSIS — I130 Hypertensive heart and chronic kidney disease with heart failure and stage 1 through stage 4 chronic kidney disease, or unspecified chronic kidney disease: Secondary | ICD-10-CM | POA: Diagnosis not present

## 2022-11-26 DIAGNOSIS — N1831 Chronic kidney disease, stage 3a: Secondary | ICD-10-CM | POA: Diagnosis not present

## 2022-11-26 DIAGNOSIS — M810 Age-related osteoporosis without current pathological fracture: Secondary | ICD-10-CM | POA: Diagnosis not present

## 2022-11-26 DIAGNOSIS — E039 Hypothyroidism, unspecified: Secondary | ICD-10-CM | POA: Diagnosis not present

## 2022-12-04 DIAGNOSIS — D692 Other nonthrombocytopenic purpura: Secondary | ICD-10-CM | POA: Diagnosis not present

## 2022-12-04 DIAGNOSIS — I130 Hypertensive heart and chronic kidney disease with heart failure and stage 1 through stage 4 chronic kidney disease, or unspecified chronic kidney disease: Secondary | ICD-10-CM | POA: Diagnosis not present

## 2022-12-04 DIAGNOSIS — I679 Cerebrovascular disease, unspecified: Secondary | ICD-10-CM | POA: Diagnosis not present

## 2022-12-04 DIAGNOSIS — N1831 Chronic kidney disease, stage 3a: Secondary | ICD-10-CM | POA: Diagnosis not present

## 2022-12-04 DIAGNOSIS — I480 Paroxysmal atrial fibrillation: Secondary | ICD-10-CM | POA: Diagnosis not present

## 2022-12-04 DIAGNOSIS — R7301 Impaired fasting glucose: Secondary | ICD-10-CM | POA: Diagnosis not present

## 2022-12-04 DIAGNOSIS — M810 Age-related osteoporosis without current pathological fracture: Secondary | ICD-10-CM | POA: Diagnosis not present

## 2022-12-04 DIAGNOSIS — I509 Heart failure, unspecified: Secondary | ICD-10-CM | POA: Diagnosis not present

## 2022-12-04 DIAGNOSIS — E039 Hypothyroidism, unspecified: Secondary | ICD-10-CM | POA: Diagnosis not present

## 2022-12-04 DIAGNOSIS — Z1339 Encounter for screening examination for other mental health and behavioral disorders: Secondary | ICD-10-CM | POA: Diagnosis not present

## 2022-12-04 DIAGNOSIS — R82998 Other abnormal findings in urine: Secondary | ICD-10-CM | POA: Diagnosis not present

## 2022-12-04 DIAGNOSIS — Z0000 Encounter for general adult medical examination without abnormal findings: Secondary | ICD-10-CM | POA: Diagnosis not present

## 2022-12-04 DIAGNOSIS — Z1331 Encounter for screening for depression: Secondary | ICD-10-CM | POA: Diagnosis not present

## 2022-12-04 DIAGNOSIS — J449 Chronic obstructive pulmonary disease, unspecified: Secondary | ICD-10-CM | POA: Diagnosis not present

## 2022-12-04 DIAGNOSIS — Z7901 Long term (current) use of anticoagulants: Secondary | ICD-10-CM | POA: Diagnosis not present

## 2023-01-06 ENCOUNTER — Telehealth: Payer: Self-pay | Admitting: Cardiology

## 2023-01-06 NOTE — Telephone Encounter (Signed)
STAT if HR is under 50 or over 120 (normal HR is 60-100 beats per minute)  What is your heart rate? 118  Do you have a log of your heart rate readings (document readings)? HR is Normally 60-65  Do you have any other symptoms? No

## 2023-01-06 NOTE — Telephone Encounter (Signed)
Left message for pt to call.

## 2023-01-06 NOTE — Telephone Encounter (Signed)
Spoke with Donald [name], he started feeling dizzy yesterday around 7 pm and he checked his heart rate and it was over 100. He did the kardia ECG and it just said tachycardia. This morning he continues to feel dizzy and his ECG said possible atrial fib. He is not having chest pain or SOB but he does feel dizzy. His bp is 100/78. He will see the a fib clinic tomorrow morning at 8:30 am. ER precautions discussed with the patient.

## 2023-01-07 ENCOUNTER — Ambulatory Visit (HOSPITAL_COMMUNITY)
Admission: RE | Admit: 2023-01-07 | Discharge: 2023-01-07 | Disposition: A | Discharge status: Home / Self Care | Payer: Medicare Other | Source: Ambulatory Visit | Attending: Internal Medicine | Admitting: Internal Medicine

## 2023-01-07 VITALS — BP 96/70 | HR 119 | Ht 70.0 in | Wt 164.6 lb

## 2023-01-07 DIAGNOSIS — I4892 Unspecified atrial flutter: Secondary | ICD-10-CM | POA: Insufficient documentation

## 2023-01-07 DIAGNOSIS — I4819 Other persistent atrial fibrillation: Secondary | ICD-10-CM

## 2023-01-07 DIAGNOSIS — I480 Paroxysmal atrial fibrillation: Secondary | ICD-10-CM | POA: Insufficient documentation

## 2023-01-07 DIAGNOSIS — I2510 Atherosclerotic heart disease of native coronary artery without angina pectoris: Secondary | ICD-10-CM | POA: Diagnosis not present

## 2023-01-07 DIAGNOSIS — D6869 Other thrombophilia: Secondary | ICD-10-CM | POA: Diagnosis not present

## 2023-01-07 DIAGNOSIS — I509 Heart failure, unspecified: Secondary | ICD-10-CM | POA: Diagnosis not present

## 2023-01-07 DIAGNOSIS — Z79899 Other long term (current) drug therapy: Secondary | ICD-10-CM | POA: Insufficient documentation

## 2023-01-07 LAB — COMPREHENSIVE METABOLIC PANEL
ALT: 15 U/L (ref 0–44)
AST: 20 U/L (ref 15–41)
Albumin: 3.2 g/dL — ABNORMAL LOW (ref 3.5–5.0)
Alkaline Phosphatase: 61 U/L (ref 38–126)
Anion gap: 9 (ref 5–15)
BUN: 23 mg/dL (ref 8–23)
CO2: 30 mmol/L (ref 22–32)
Calcium: 9.3 mg/dL (ref 8.9–10.3)
Chloride: 101 mmol/L (ref 98–111)
Creatinine, Ser: 1.2 mg/dL (ref 0.61–1.24)
GFR, Estimated: 56 mL/min — ABNORMAL LOW (ref >60)
Glucose, Bld: 107 mg/dL — ABNORMAL HIGH (ref 70–99)
Potassium: 3.9 mmol/L (ref 3.5–5.1)
Sodium: 140 mmol/L (ref 135–145)
Total Bilirubin: 0.5 mg/dL (ref 0.3–1.2)
Total Protein: 6.4 g/dL — ABNORMAL LOW (ref 6.5–8.1)

## 2023-01-07 LAB — CBC
HCT: 42.9 % (ref 39.0–52.0)
Hemoglobin: 13.9 g/dL (ref 13.0–17.0)
MCH: 31.5 pg (ref 26.0–34.0)
MCHC: 32.4 g/dL (ref 30.0–36.0)
MCV: 97.3 fL (ref 80.0–100.0)
Platelets: 243 10*3/uL (ref 150–400)
RBC: 4.41 MIL/uL (ref 4.22–5.81)
RDW: 13.5 % (ref 11.5–15.5)
WBC: 6 10*3/uL (ref 4.0–10.5)
nRBC: 0 % (ref 0.0–0.2)

## 2023-01-07 LAB — TSH: TSH: 6.185 u[IU]/mL — ABNORMAL HIGH (ref 0.350–4.500)

## 2023-01-07 NOTE — Progress Notes (Addendum)
Primary Care Physician: [name], Adelfa Koh, MD Primary Cardiologist: Olga Millers, MD Electrophysiologist: None     Referring Physician: Dr. Beverly Milch is a 89 y.o. male with a history of CAD, CVA, CHF, and paroxysmal atrial fibrillation who presents for consultation in the [facility]. He contacted office yesterday 10/9 noting HR > 100 and Kardiamobile reading possible Afib. He takes amiodarone 100 mg daily. Patient is on Eliquis 5 mg BID for a CHADS2VASC score of 7.  On evaluation today, he is currently in atrial flutter with RVR. He states he feels dizzy at times. No missed doses of Eliquis or amiodarone.    Today, he denies symptoms of palpitations, chest pain, shortness of breath, orthopnea, PND, lower extremity edema, presyncope, syncope, snoring, daytime somnolence, bleeding, or neurologic sequela. The patient is tolerating medications without difficulties and is otherwise without complaint today.   he has a BMI of Body mass index is 23.62 kg/m.Marland Kitchen Filed Weights   01/07/23 0831  Weight: 74.7 kg    Current Outpatient Medications  Medication Sig Dispense Refill   alendronate (FOSAMAX) 70 MG tablet Take 70 mg by mouth every Sunday. Take with a full glass of water on an empty stomach.     amiodarone (PACERONE) 100 MG tablet Take 1 tablet (100 mg total) by mouth daily. 90 tablet 3   Cholecalciferol (VITAMIN D) 2000 UNITS tablet Take 2,000 Units by mouth daily.     ELIQUIS 5 MG TABS tablet Take 5 mg by mouth 2 (two) times daily.     fluticasone furoate-vilanterol (BREO ELLIPTA) 100-25 MCG/INH AEPB Inhale 1 puff into the lungs daily as needed (asthma).     furosemide (LASIX) 40 MG tablet TAKE 2 TABLETS (80MG  TOTAL)TWO TIMES A DAY 360 tablet 2   latanoprost (XALATAN) 0.005 % ophthalmic solution Place 1 drop into the left eye at bedtime.     levothyroxine (SYNTHROID) 25 MCG tablet TAKE 1 TABLET DAILY BEFORE BREAKFAST 90 tablet 2    loratadine (CLARITIN) 10 MG tablet Take 10 mg by mouth daily.     metoprolol succinate (TOPROL XL) 25 MG 24 hr tablet Take 1 tablet (25 mg total) by mouth at bedtime. 90 tablet 3   Multiple Vitamin (MULTIVITAMIN WITH MINERALS) TABS tablet Take 1 tablet by mouth daily.     Multiple Vitamins-Minerals (ICAPS AREDS 2 PO) Take 1 tablet by mouth daily.     pravastatin (PRAVACHOL) 40 MG tablet TAKE 1 TABLET EVERY EVENING 90 tablet 3   tamsulosin (FLOMAX) 0.4 MG CAPS capsule Take 0.4 mg by mouth every evening.     No current facility-administered medications for this encounter.    Atrial Fibrillation Management history:  Previous antiarrhythmic drugs: amiodarone Previous cardioversions:  Previous ablations:  Anticoagulation history: Eliquis   ROS- All systems are reviewed and negative except as per the HPI above.  Physical Exam: BP 96/70   Pulse (!) 119   Ht 5\' 10"  (1.778 m)   Wt 74.7 kg   BMI 23.62 kg/m   GEN: Well nourished, well developed in no acute distress NECK: No JVD; No carotid bruits CARDIAC: Tachycardic rate and rhythm, no murmurs, rubs, gallops RESPIRATORY:  Clear to auscultation without rales, wheezing or rhonchi  ABDOMEN: Soft, non-tender, non-distended EXTREMITIES:  No edema; No deformity   EKG today demonstrates  Vent. rate 119 BPM PR interval 160 ms QRS duration 152 ms QT/QTcB 334/469 ms P-R-T axes 248 212 -63 Unusual P axis, possible  ectopic atrial tachycardia Right bundle branch block Inferior infarct , age undetermined Anterolateral infarct , age undetermined Abnormal ECG When compared with ECG of 03-Apr-2022 07:53, PREVIOUS ECG IS PRESENT  Echo 02/11/22 demonstrated  1. 3D EF - overestimated. Left ventricular ejection fraction, by  estimation, is 40 to 45%. Left ventricular ejection fraction by 3D volume  is 60 %. The left ventricle has mildly decreased function. The left  ventricle demonstrates regional wall motion  abnormalities (see scoring  diagram/findings for description). Left  ventricular diastolic parameters are consistent with Grade I diastolic  dysfunction (impaired relaxation). There is severe akinesis of the left  ventricular, mid-apical anteroseptal wall  and septal wall. There is severe akinesis of the left ventricular, apical  lateral wall and inferolateral wall.   2. Right ventricular systolic function is normal. The right ventricular  size is normal.   3. Left atrial size was moderately dilated.   4. Right atrial size was severely dilated.   5. The mitral valve is normal in structure. Mild to moderate mitral valve  regurgitation. No evidence of mitral stenosis.   6. Tricuspid valve regurgitation is moderate to severe.   7. The aortic valve is calcified. There is mild calcification of the  aortic valve. There is mild thickening of the aortic valve. Aortic valve  regurgitation is not visualized. Aortic valve sclerosis/calcification is  present, without any evidence of  aortic stenosis.   8. The inferior vena cava is normal in size with greater than 50%  respiratory variability, suggesting right atrial pressure of 3 mmHg.    ASSESSMENT & PLAN CHA2DS2-VASc Score = 7  The patient's score is based upon: CHF History: 1 HTN History: 1 Diabetes History: 0 Stroke History: 2 Vascular Disease History: 1 Age Score: 2 Gender Score: 0       ASSESSMENT AND PLAN: Paroxysmal Atrial Fibrillation (ICD10:  I48.0) / Atrial flutter The patient's CHA2DS2-VASc score is 7, indicating a 11.2% annual risk of stroke.    He is currently in atrial flutter with RVR.  Discussion with patient on treatment plan. After discussion, he agrees to proceed with cardioversion. We will temporarily increase his amiodarone to 200 mg daily x 2 weeks. If he develops worsening dizziness due to reload, then transition back to 100 mg daily.  Informed Consent   Shared Decision Making/Informed Consent The risks (stroke, cardiac arrhythmias  rarely resulting in the need for a temporary or permanent pacemaker, skin irritation or burns and complications associated with conscious sedation including aspiration, arrhythmia, respiratory failure and death), benefits (restoration of normal sinus rhythm) and alternatives of a direct current cardioversion were explained in detail to Mr. Ionescu and he agrees to proceed.     Secondary Hypercoagulable State (ICD10:  D68.69) The patient is at significant risk for stroke/thromboembolism based upon his CHA2DS2-VASc Score of 7.  Continue Apixaban (Eliquis).  No missed doses.     Follow up 2 weeks after DCCV.   Lake Bells, PA-C  [facility] [address] [place], [place] [address] [phone]

## 2023-01-07 NOTE — Patient Instructions (Addendum)
Increase amiodarone to 200mg  once a day until 10/24 then reduce back to once a day    Cardioversion scheduled for: Monday, October 28th   - Arrive at the [facility] and go to admitting at 930am   - Do not eat or drink anything after midnight the night prior to your procedure.   - Take all your morning medication (except diabetic medications) with a sip of water prior to arrival.  - You will not be able to drive home after your procedure.    - Do NOT miss any doses of your blood thinner - if you should miss a dose please notify our office immediately.   - If you feel as if you go back into normal rhythm prior to scheduled cardioversion, please notify our office immediately.   If your procedure is canceled in the cardioversion suite you will be charged a cancellation fee.

## 2023-01-15 ENCOUNTER — Other Ambulatory Visit (HOSPITAL_COMMUNITY): Payer: Self-pay

## 2023-01-20 ENCOUNTER — Encounter: Payer: Self-pay | Admitting: Nurse Practitioner

## 2023-01-20 ENCOUNTER — Ambulatory Visit: Payer: Medicare Other | Attending: Nurse Practitioner | Admitting: Nurse Practitioner

## 2023-01-20 VITALS — BP 120/74 | HR 54 | Ht 70.0 in | Wt 166.0 lb

## 2023-01-20 DIAGNOSIS — I4892 Unspecified atrial flutter: Secondary | ICD-10-CM | POA: Diagnosis not present

## 2023-01-20 DIAGNOSIS — I5042 Chronic combined systolic (congestive) and diastolic (congestive) heart failure: Secondary | ICD-10-CM | POA: Diagnosis not present

## 2023-01-20 DIAGNOSIS — I340 Nonrheumatic mitral (valve) insufficiency: Secondary | ICD-10-CM | POA: Insufficient documentation

## 2023-01-20 DIAGNOSIS — I480 Paroxysmal atrial fibrillation: Secondary | ICD-10-CM | POA: Diagnosis not present

## 2023-01-20 DIAGNOSIS — I739 Peripheral vascular disease, unspecified: Secondary | ICD-10-CM | POA: Insufficient documentation

## 2023-01-20 DIAGNOSIS — I361 Nonrheumatic tricuspid (valve) insufficiency: Secondary | ICD-10-CM | POA: Diagnosis not present

## 2023-01-20 DIAGNOSIS — I350 Nonrheumatic aortic (valve) stenosis: Secondary | ICD-10-CM | POA: Diagnosis not present

## 2023-01-20 DIAGNOSIS — I2510 Atherosclerotic heart disease of native coronary artery without angina pectoris: Secondary | ICD-10-CM | POA: Insufficient documentation

## 2023-01-20 NOTE — Patient Instructions (Signed)
Medication Instructions:  Your physician recommends that you continue on your current medications as directed. Please refer to the Current Medication list given to you today.  *If you need a refill on your cardiac medications before your next appointment, please call your pharmacy*   Lab Work: NONE ordered at this time of appointment     Testing/Procedures: Canceling Cardioversion. Pt is in sinus rhythm.     Follow-Up: At [facility], you and your health needs are our priority.  As part of our continuing mission to provide you with exceptional heart care, we have created designated Provider Care Teams.  These Care Teams include your primary Cardiologist (physician) and Advanced Practice Providers (APPs -  Physician Assistants and Nurse Practitioners) who all work together to provide you with the care you need, when you need it.  We recommend signing up for the patient portal called "MyChart".  Sign up information is provided on this After Visit Summary.  MyChart is used to connect with patients for Virtual Visits (Telemedicine).  Patients are able to view lab/test results, encounter notes, upcoming appointments, etc.  Non-urgent messages can be sent to your provider as well.   To learn more about what you can do with MyChart, go to [email or web address].    Your next appointment:   6 month(s)  Provider:   Olga Millers, MD     Other Instructions KardiaMobile discussed at appointment

## 2023-01-20 NOTE — Progress Notes (Signed)
Office Visit    Patient Name: Donald [name] Date of Encounter: 01/20/2023  Primary Care Provider:  Gaspar Garbe, MD Primary Cardiologist:  Olga Millers, MD  Chief Complaint    89 year old male with a history of persistent atrial fibrillation, CAD s/p CABG in 1990, chronic combined systolic and diastolic heart failure/ICM, apical thrombus, aortic stenosis, MR, TR, mild carotid artery stenosis, and PVD (follows with vascular surgery) who presents for follow-up related to CAD and atrial fibrillation.   Past Medical History    Past Medical History:  Diagnosis Date   Asthma    with worsening with beta blockade   Atrial fibrillation (HCC)    CAD (coronary artery disease)    CVA (cerebral infarction) 2012   tia, mild no residual defecits   GI bleed 8 yrs ago   due to doll fully vessel which was clipped   Glaucoma    excellent cataracts   History of kidney stones    Ischemic cardiomyopathy    Moderate to severe mitral regurgitation 06/16/2017   Nephrolithiasis    Post-infarction apical thrombus [facility])    Prostate cancer [facility]) 2012   Radiation proctitis Feb 2015   treated with APC   Tubular adenoma 04/2013   Dr. Rhea Belton   Past Surgical History:  Procedure Laterality Date   cardiac stents  2003   CARDIOVERSION N/A 06/15/2017   Procedure: CARDIOVERSION;  Surgeon: Elease Hashimoto Deloris Ping, MD;  Location: [facility] ENDOSCOPY;  Service: Cardiovascular;  Laterality: N/A;   CARDIOVERSION N/A 07/15/2017   Procedure: CARDIOVERSION;  Surgeon: Jake Bathe, MD;  Location: [facility] ENDOSCOPY;  Service: Cardiovascular;  Laterality: N/A;   CARDIOVERSION N/A 07/20/2017   Procedure: CARDIOVERSION;  Surgeon: Laqueta Linden, MD;  Location: [facility] ENDOSCOPY;  Service: Cardiovascular;  Laterality: N/A;   CARDIOVERSION N/A 11/23/2017   Procedure: CARDIOVERSION;  Surgeon: Lewayne Bunting, MD;  Location: [facility] ENDOSCOPY;  Service: Cardiovascular;  Laterality: N/A;   CARDIOVERSION N/A 06/03/2021   Procedure:  CARDIOVERSION;  Surgeon: Little Ishikawa, MD;  Location: [facility] ENDOSCOPY;  Service: Cardiovascular;  Laterality: N/A;   CARDIOVERSION N/A 04/03/2022   Procedure: CARDIOVERSION;  Surgeon: Thurmon Fair, MD;  Location: [facility] ENDOSCOPY;  Service: Cardiovascular;  Laterality: N/A;   COLONOSCOPY WITH PROPOFOL N/A 05/05/2013   Procedure: COLONOSCOPY WITH PROPOFOL;  Surgeon: Beverley Fiedler, MD;  Location: [facility] ENDOSCOPY;  Service: Gastroenterology;  Laterality: N/A;   CORONARY ARTERY BYPASS GRAFT  1998   LIMA to the LAD and saphenous vein graft tothe diagonal   EXTRACORPOREAL SHOCK WAVE LITHOTRIPSY Right 04/29/2017   Procedure: RIGHT EXTRACORPOREAL SHOCK WAVE LITHOTRIPSY (ESWL);  Surgeon: Sebastian Ache, MD;  Location: [facility] ORS;  Service: Urology;  Laterality: Right;   history of radiation treatment  2012   x 40 treatments done   KNEE ARTHROSCOPY  2016 or 2017   Dr Darrelyn Hillock ;    PARTIAL KNEE ARTHROPLASTY Right 01/13/2017   Procedure: UNICOMPARTMENTAL RIGHT KNEE;  Surgeon: Ollen Gross, MD;  Location: [facility] ORS;  Service: Orthopedics;  Laterality: Right;  with block    Allergies  Allergies  Allergen Reactions   Beta Adrenergic Blockers Other (See Comments)    Other reaction(s): asthma exacerbation   Keflex [Cephalexin] Nausea And Vomiting and Other (See Comments)    'Killed all GI enzymes" (also) and patient prefers to not take this   Morphine Nausea Only     Labs/Other Studies Reviewed    The following studies were reviewed today:  Cardiac Studies & Procedures  STRESS TESTS  MYOCARDIAL PERFUSION IMAGING 12/03/2016  Narrative  Defect in distal anterior, distal inferior and apical walls consistent with small region of scar. Inferior thinning consistent with soft tissue attenuation and/or scar No ischemia.  Nuclear stress EF: 46%.  This is an intermediate risk study.   ECHOCARDIOGRAM  ECHOCARDIOGRAM COMPLETE 02/11/2022  Narrative ECHOCARDIOGRAM REPORT    Patient Name:   Donald [name] Date of Exam: 02/11/2022 Medical Rec #:  [id number]       Height:       70.0 in Accession #:    [id number]      Weight:       166.0 lb Date of Birth:  [birth year]       BSA:          1.928 m Patient Age:    89 years        BP:           116/68 mmHg Patient Gender: M               HR:           66 bpm. Exam Location:  [place]  Procedure: 2D Echo, Color Doppler, Cardiac Doppler, Intracardiac Opacification Agent and 3D Echo  Indications:    R06.9 DOE; I25.5 Ischemic cardiomyopathy  History:        Patient has prior history of Echocardiogram examinations, most recent 08/29/2020. Cardiomyopathy and CHF, CAD, cardioversion 06/03/2021, Stroke, TIA and PAD, Arrythmias:Atrial Fibrillation, Signs/Symptoms:Dyspnea; Risk Factors:Dyslipidemia and Non-Smoker. Patient denies chest pain and leg edema. He does have DOE.  Sonographer:    Carlos American RVT, RDCS (AE), RDMS Referring Phys: [id number] BRIAN S CRENSHAW   Sonographer Comments: Poor apical windows. IMPRESSIONS   1. 3D EF - overestimated. Left ventricular ejection fraction, by estimation, is 40 to 45%. Left ventricular ejection fraction by 3D volume is 60 %. The left ventricle has mildly decreased function. The left ventricle demonstrates regional wall motion abnormalities (see scoring diagram/findings for description). Left ventricular diastolic parameters are consistent with Grade I diastolic dysfunction (impaired relaxation). There is severe akinesis of the left ventricular, mid-apical anteroseptal wall and septal wall. There is severe akinesis of the left ventricular, apical lateral wall and inferolateral wall. 2. Right ventricular systolic function is normal. The right ventricular size is normal. 3. Left atrial size was moderately dilated. 4. Right atrial size was severely dilated. 5. The mitral valve is normal in structure. Mild to moderate mitral valve regurgitation. No evidence of mitral stenosis. 6. Tricuspid valve regurgitation is  moderate to severe. 7. The aortic valve is calcified. There is mild calcification of the aortic valve. There is mild thickening of the aortic valve. Aortic valve regurgitation is not visualized. Aortic valve sclerosis/calcification is present, without any evidence of aortic stenosis. 8. The inferior vena cava is normal in size with greater than 50% respiratory variability, suggesting right atrial pressure of 3 mmHg.  Comparison(s): EF 45%, moderate TR, mild AS mean 5, peak 8 mmHg, RV function decreased, akinesis of the left ventricular, apical anteroseptal wall, inferoseptal wall, anterior wall, inferior wall, anterolateral wall and inferolateral wall.   FINDINGS Left Ventricle: 3D EF - overestimated. Left ventricular ejection fraction, by estimation, is 40 to 45%. Left ventricular ejection fraction by 3D volume is 60 %. The left ventricle has mildly decreased function. The left ventricle demonstrates regional wall motion abnormalities. Severe akinesis of the left ventricular, mid-apical anteroseptal wall and septal wall. Severe akinesis of the left ventricular, apical lateral wall and  inferolateral wall. Definity contrast agent was given IV to delineate the left ventricular endocardial borders. The left ventricular internal cavity size was normal in size. There is no left ventricular hypertrophy. Left ventricular diastolic parameters are consistent with Grade I diastolic dysfunction (impaired relaxation).   LV Wall Scoring: The mid inferoseptal segment, apical septal segment, and apex are akinetic. The mid anteroseptal segment, apical lateral segment, apical anterior segment, and apical inferior segment are hypokinetic.  Right Ventricle: The right ventricular size is normal. No increase in right ventricular wall thickness. Right ventricular systolic function is normal.  Left Atrium: Left atrial size was moderately dilated.  Right Atrium: Right atrial size was severely dilated.  Pericardium:  There is no evidence of pericardial effusion.  Mitral Valve: The mitral valve is normal in structure. Mild to moderate mitral valve regurgitation. No evidence of mitral valve stenosis.  Tricuspid Valve: The tricuspid valve is normal in structure. Tricuspid valve regurgitation is moderate to severe. No evidence of tricuspid stenosis.  Aortic Valve: The aortic valve is calcified. There is mild calcification of the aortic valve. There is mild thickening of the aortic valve. Aortic valve regurgitation is not visualized. Aortic valve sclerosis/calcification is present, without any evidence of aortic stenosis. Aortic valve mean gradient measures 6.0 mmHg. Aortic valve peak gradient measures 11.2 mmHg. Aortic valve area, by VTI measures 2.36 cm.  Pulmonic Valve: The pulmonic valve was normal in structure. Pulmonic valve regurgitation is not visualized. No evidence of pulmonic stenosis.  Aorta: The aortic root is normal in size and structure.  Venous: The inferior vena cava is normal in size with greater than 50% respiratory variability, suggesting right atrial pressure of 3 mmHg.  IAS/Shunts: No atrial level shunt detected by color flow Doppler.   LEFT VENTRICLE PLAX 2D LVIDd:         4.90 cm         Diastology LVIDs:         3.70 cm         LV e' medial:    6.85 cm/s LV PW:         0.90 cm         LV E/e' medial:  9.7 LV IVS:        1.00 cm         LV e' lateral:   6.96 cm/s LVOT diam:     2.40 cm         LV E/e' lateral: 9.6 LV SV:         81 LV SV Index:   42 LVOT Area:     4.52 cm        3D Volume EF LV 3D EF:    Left ventricul ar ejection fraction by 3D volume is 60 %.  3D Volume EF: 3D EF:        60 % LV EDV:       135 ml LV ESV:       54 ml LV SV:        81 ml  RIGHT VENTRICLE RV S prime:     9.57 cm/s TAPSE (M-mode): 1.5 cm  LEFT ATRIUM              Index        RIGHT ATRIUM           Index LA diam:        4.10 cm  2.13 cm/m   RA Area:     27.90 cm LA Vol (  A2C):    113.0 ml 58.60 ml/m  RA Volume:   109.00 ml 56.52 ml/m LA Vol (A4C):   54.4 ml  28.21 ml/m LA Biplane Vol: 80.9 ml  41.95 ml/m AORTIC VALVE                     PULMONIC VALVE AV Area (Vmax):    2.52 cm      PV Vmax:       0.92 m/s AV Area (Vmean):   2.22 cm      PV Peak grad:  3.4 mmHg AV Area (VTI):     2.36 cm AV Vmax:           167.00 cm/s AV Vmean:          111.000 cm/s AV VTI:            0.341 m AV Peak Grad:      11.2 mmHg AV Mean Grad:      6.0 mmHg LVOT Vmax:         93.20 cm/s LVOT Vmean:        54.500 cm/s LVOT VTI:          0.178 m LVOT/AV VTI ratio: 0.52  AORTA Ao Root diam: 3.90 cm  MITRAL VALVE                TRICUSPID VALVE MV Area (PHT): 1.67 cm     TR Peak grad:   21.2 mmHg MV Decel Time: 454 msec     TR Vmax:        230.00 cm/s MR Peak grad: 136.0 mmHg MR Vmax:      583.00 cm/s   SHUNTS MV E velocity: 66.70 cm/s   Systemic VTI:  0.18 m MV A velocity: 117.00 cm/s  Systemic Diam: 2.40 cm MV E/A ratio:  0.57  Gypsy Balsam MD Electronically signed by Gypsy Balsam MD Signature Date/Time: 02/11/2022/5:29:39 PM    Final            Recent Labs: 01/07/2023: ALT 15; BUN 23; Creatinine, Ser 1.20; Hemoglobin 13.9; Platelets 243; Potassium 3.9; Sodium 140; TSH 6.185  Recent Lipid Panel    Component Value Date/Time   CHOL 129 05/14/2021 0957   TRIG 76 05/14/2021 0957   TRIG 44 08/13/2008 0000   HDL 54 05/14/2021 0957   CHOLHDL 2.4 05/14/2021 0957   CHOLHDL 3.2 07/15/2017 2329   VLDL 10 07/15/2017 2329   LDLCALC 60 05/14/2021 0957    History of Present Illness    89 year old male with the above past medical history including persistent atrial fibrillation, CAD s/p CABG in 1990, chronic combined systolic and diastolic heart failure/ICM, apical thrombus, mild aortic stenosis, MR, TR, mild carotid artery stenosis, and PVD (follows with vascular surgery).   He has a history of ICM with prior stroke and prior mural apical thrombus. Also with  persistent atrial fibrillation s/p prior DCCV in 2019, on Eliquis. Prior abdominal ultrasound in May 2006 showed no evidence of aneurysm. LHC in 08/2011 showed LM 30, oLAD 100%, dLAD fills via RV branch off RCA, IM diffuse 95% subtotal disease, mCFX 40%, OM1 normal, RCA normal with large RV branch supplying dLAD, PLA possibly occluded and most dRCA appears to fill from LIMA collats; LIMA patent but does not supply dLAD (connects via small vessel to ? DRCA/PLA, S-IM occluded). Circulation felt to be stable. Ongoing medical management was recommended. Nuclear study in 2018 showed EF 46%, prior infarct but no evidence of ischemia.  He has a history of subdural hematoma in the setting of fall in April 2019.  He was diagnosed with right pelvic fracture at the time initially treated conservatively.  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  He has a history of hypertension/orthostasis.  Amiodarone was decreased to 100 mg daily in October 2018 in setting of dizziness, tremors, and constipation. Echocardiogram in 08/2020 showed EF 45 to 50%, moderate RV dysfunction, mild LAE, severe RAE, mild MR, moderate TR, mild AS.  He underwent elective repeat DCCV for atrial flutter in 05/2021.  Most recent echocardiogram in 01/2022 showed EF 40 to 45%, mildly decreased LV function, G1 DD, BAE, to moderate mitral valve regurgitation, moderate to severe tricuspid valve regurgitation.  He was seen in follow-up on 04/02/2022 and noted increased shortness of breath, dizziness, and an episode of near syncope.  He was noted to have recurrent atrial flutter, HR 114 bpm.  He underwent repeat DCCV on 04/03/2022 with restoration of NSR.  He was ultimately referred to the A-fib clinic in the setting of recurrent atrial fibrillation with RVR.  He was last seen in the office (A-fib clinic) on 01/07/2023 and he was noted to be in atrial fibrillation with RVR with associated dizziness.   Amiodarone was temporarily increased to 200 mg daily x 2 weeks in anticipation of repeat DCCV scheduled for 01/25/2023.   He presents today for follow-up.  Since his last visit he has done well from a cardiac standpoint. He denies any chest pain, palpitations, dyspnea, edema, PND, orthopnea, weight gain. He is now maintaining sinus rhythm.  Overall, he reports feeling well.  Home Medications    Current Outpatient Medications  Medication Sig Dispense Refill   alendronate (FOSAMAX) 70 MG tablet Take 70 mg by mouth every Sunday. Take with a full glass of water on an empty stomach.     amiodarone (PACERONE) 100 MG tablet Take 1 tablet (100 mg total) by mouth daily. 90 tablet 3   Cholecalciferol (VITAMIN D) 2000 UNITS tablet Take 2,000 Units by mouth daily.     ELIQUIS 5 MG TABS tablet Take 5 mg by mouth 2 (two) times daily.     fluticasone furoate-vilanterol (BREO ELLIPTA) 100-25 MCG/INH AEPB Inhale 1 puff into the lungs daily as needed (asthma).     furosemide (LASIX) 40 MG tablet TAKE 2 TABLETS (80MG  TOTAL)TWO TIMES A DAY 360 tablet 2   latanoprost (XALATAN) 0.005 % ophthalmic solution Place 1 drop into the left eye at bedtime.     levothyroxine (SYNTHROID) 25 MCG tablet TAKE 1 TABLET DAILY BEFORE BREAKFAST 90 tablet 2   loratadine (CLARITIN) 10 MG tablet Take 10 mg by mouth daily.     metoprolol succinate (TOPROL XL) 25 MG 24 hr tablet Take 1 tablet (25 mg total) by mouth at bedtime. 90 tablet 3   Multiple Vitamin (MULTIVITAMIN WITH MINERALS) TABS tablet Take 1 tablet by mouth daily.     Multiple Vitamins-Minerals (ICAPS AREDS 2 PO) Take 1 tablet by mouth daily.     pravastatin (PRAVACHOL) 40 MG tablet TAKE 1 TABLET EVERY EVENING 90 tablet 3   tamsulosin (FLOMAX) 0.4 MG CAPS capsule Take 0.4 mg by mouth every evening.     No current facility-administered medications for this visit.     Review of Systems    He denies chest pain, palpitations, dyspnea, pnd, orthopnea, n, v, dizziness,  syncope, edema, weight gain, or early satiety. All other systems reviewed and  are otherwise negative except as noted above.   Physical Exam    VS:  BP 120/74 (BP Location: Left Arm, Patient Position: Sitting, Cuff Size: Normal)   Pulse (!) 54   Ht 5\' 10"  (1.778 m)   Wt 166 lb (75.3 kg)   SpO2 99%   BMI 23.82 kg/m  GEN: Well nourished, well developed, in no acute distress. HEENT: normal. Neck: Supple, no JVD, carotid bruits, or masses. Cardiac: RRR, no murmurs, rubs, or gallops. No clubbing, cyanosis, edema.  Radials/DP/PT 2+ and equal bilaterally.  Respiratory:  Respirations regular and unlabored, clear to auscultation bilaterally. GI: Soft, nontender, nondistended, BS + x 4. MS: no deformity or atrophy. Skin: warm and dry, no rash. Neuro:  Strength and sensation are intact. Psych: Normal affect.  Accessory Clinical Findings    ECG personally reviewed by me today -    -sinus bradycardia, 54 bpm, RBBB no acute changes.   Lab Results  Component Value Date   WBC 6.0 01/07/2023   HGB 13.9 01/07/2023   HCT 42.9 01/07/2023   MCV 97.3 01/07/2023   PLT 243 01/07/2023   Lab Results  Component Value Date   CREATININE 1.20 01/07/2023   BUN 23 01/07/2023   NA 140 01/07/2023   K 3.9 01/07/2023   CL 101 01/07/2023   CO2 30 01/07/2023   Lab Results  Component Value Date   ALT 15 01/07/2023   AST 20 01/07/2023   ALKPHOS 61 01/07/2023   BILITOT 0.5 01/07/2023   Lab Results  Component Value Date   CHOL 129 05/14/2021   HDL 54 05/14/2021   LDLCALC 60 05/14/2021   TRIG 76 05/14/2021   CHOLHDL 2.4 05/14/2021    Lab Results  Component Value Date   HGBA1C 5.9 (H) 07/15/2017    Assessment & Plan    1. Persistent atrial fibrillation/atrial flutter: S/p repeat DCCV on 04/03/2022 for recurrent symptomatic atrial flutter.   He was scheduled for DCCV on 01/25/2023 per A-fib clinic due to recurrent symptomatic atrial fibrillation.  He is now maintaining sinus rhythm.  Will cancel  DCCV.  He will continue to monitor his HR/rhythm at home with a Kardia[employer] device.  TSH was recently elevated, his Synthroid was increased per his PCP.  Recent CBC, CMET stable.  Continue, amiodarone, metoprolol and  Xarelto.  I will have him follow-up with A-fib clinic as scheduled.   2. CAD: S/p CABG in 1990.  Most recent cath in 2013 stable overall. Nuclear study in 2018 showed EF 46%, prior infarct but no evidence of ischemia. Stable with no anginal symptoms. No indication for ischemic evaluation.  Continue metoprolol, pravastatin.   3. Chronic combined systolic and diastolic heart failure/ICM: Echo in 01/2022 showed EF 40 to 45%, mildly decreased LV function, G1 DD, BAE, to moderate mitral valve regurgitation, moderate to severe tricuspid valve regurgitation.  Euvolemic and well compensated on exam.  Further escalation of GDMT limited in the setting of orthostatic hypotension. Continue metoprolol, Lasix.   4. Valvular heart disease: Mild AS on prior echo. Most recent echo  in 01/2022 showed moderate to severe TR and mild to moderate MR, no AS.  Consider repeat echo as clinically indicated.    5. History of apical thrombus: Continue Eliqius.    6. PVD: History of right popliteal artery occlusion.  Has followed with vascular surgery.  Continue pravastatin.   7. Disposition: Follow-up as scheduled with a fib clinic. Follow-up in 6 months with Dr. Jens Som.  Joylene Grapes, NP 01/20/2023, 11:56 AM

## 2023-01-25 ENCOUNTER — Encounter (HOSPITAL_COMMUNITY): Payer: Self-pay

## 2023-01-25 ENCOUNTER — Ambulatory Visit (HOSPITAL_COMMUNITY): Admit: 2023-01-25 | Payer: Medicare Other | Admitting: Internal Medicine

## 2023-01-25 DIAGNOSIS — H353132 Nonexudative age-related macular degeneration, bilateral, intermediate dry stage: Secondary | ICD-10-CM | POA: Diagnosis not present

## 2023-01-25 DIAGNOSIS — Z961 Presence of intraocular lens: Secondary | ICD-10-CM | POA: Diagnosis not present

## 2023-01-25 DIAGNOSIS — H401121 Primary open-angle glaucoma, left eye, mild stage: Secondary | ICD-10-CM | POA: Diagnosis not present

## 2023-01-25 SURGERY — CARDIOVERSION
Anesthesia: General

## 2023-01-26 DIAGNOSIS — Z23 Encounter for immunization: Secondary | ICD-10-CM | POA: Diagnosis not present

## 2023-02-04 ENCOUNTER — Other Ambulatory Visit: Payer: Self-pay

## 2023-02-04 ENCOUNTER — Ambulatory Visit (HOSPITAL_COMMUNITY)
Admission: RE | Admit: 2023-02-04 | Discharge: 2023-02-04 | Disposition: A | Discharge status: Home / Self Care | Payer: Medicare Other | Source: Ambulatory Visit | Attending: Internal Medicine | Admitting: Internal Medicine

## 2023-02-04 VITALS — BP 120/60 | HR 64 | Ht 70.0 in | Wt 164.6 lb

## 2023-02-04 DIAGNOSIS — D6869 Other thrombophilia: Secondary | ICD-10-CM | POA: Insufficient documentation

## 2023-02-04 DIAGNOSIS — Z8673 Personal history of transient ischemic attack (TIA), and cerebral infarction without residual deficits: Secondary | ICD-10-CM | POA: Diagnosis not present

## 2023-02-04 DIAGNOSIS — Z5181 Encounter for therapeutic drug level monitoring: Secondary | ICD-10-CM

## 2023-02-04 DIAGNOSIS — I480 Paroxysmal atrial fibrillation: Secondary | ICD-10-CM | POA: Diagnosis not present

## 2023-02-04 DIAGNOSIS — Z7901 Long term (current) use of anticoagulants: Secondary | ICD-10-CM | POA: Diagnosis not present

## 2023-02-04 DIAGNOSIS — I4892 Unspecified atrial flutter: Secondary | ICD-10-CM | POA: Diagnosis not present

## 2023-02-04 DIAGNOSIS — I4891 Unspecified atrial fibrillation: Secondary | ICD-10-CM

## 2023-02-04 DIAGNOSIS — I4510 Unspecified right bundle-branch block: Secondary | ICD-10-CM | POA: Diagnosis not present

## 2023-02-04 DIAGNOSIS — I2510 Atherosclerotic heart disease of native coronary artery without angina pectoris: Secondary | ICD-10-CM | POA: Insufficient documentation

## 2023-02-04 DIAGNOSIS — Z79899 Other long term (current) drug therapy: Secondary | ICD-10-CM | POA: Diagnosis not present

## 2023-02-04 NOTE — Progress Notes (Signed)
Primary Care Physician: [name], Adelfa Koh, MD Primary Cardiologist: Olga Millers, MD Electrophysiologist: None     Referring Physician: Dr. Beverly Milch is a 89 y.o. male with a history of CAD, CVA, CHF, and paroxysmal atrial fibrillation who presents for consultation in the [facility]. He contacted office yesterday 01/06/23 noting HR > 100 and Kardiamobile reading possible Afib. He takes amiodarone 100 mg daily. Patient is on Eliquis 5 mg BID for a CHADS2VASC score of 7.  On evaluation today 01/07/23, he is currently in atrial flutter with RVR. He states he feels dizzy at times. No missed doses of Eliquis or amiodarone.    On follow up 02/04/23, he is currently in NSR. After last office visit, he was instructed to reload amiodarone 200 mg daily x 2 weeks in anticipation for cardioversion. He was seen by Cardiology on 10/23 and noted to be in NSR so cardioversion was cancelled. He feels well overall and is still taking amiodarone 200 mg daily.   Today, he denies symptoms of palpitations, chest pain, shortness of breath, orthopnea, PND, lower extremity edema, presyncope, syncope, snoring, daytime somnolence, bleeding, or neurologic sequela. The patient is tolerating medications without difficulties and is otherwise without complaint today.   he has a BMI of Body mass index is 23.62 kg/m.Marland Kitchen Filed Weights   02/04/23 0924  Weight: 74.7 kg     Current Outpatient Medications  Medication Sig Dispense Refill   alendronate (FOSAMAX) 70 MG tablet Take 70 mg by mouth every Sunday. Take with a full glass of water on an empty stomach.     amiodarone (PACERONE) 100 MG tablet Take 1 tablet (100 mg total) by mouth daily. 90 tablet 3   Cholecalciferol (VITAMIN D) 2000 UNITS tablet Take 2,000 Units by mouth daily.     ELIQUIS 5 MG TABS tablet Take 5 mg by mouth 2 (two) times daily.     fluticasone furoate-vilanterol (BREO ELLIPTA) 100-25 MCG/INH AEPB  Inhale 1 puff into the lungs daily as needed (asthma).     furosemide (LASIX) 40 MG tablet TAKE 2 TABLETS (80MG  TOTAL)TWO TIMES A DAY 360 tablet 2   levothyroxine (SYNTHROID) 25 MCG tablet TAKE 1 TABLET DAILY BEFORE BREAKFAST 90 tablet 2   loratadine (CLARITIN) 10 MG tablet Take 10 mg by mouth daily.     metoprolol succinate (TOPROL XL) 25 MG 24 hr tablet Take 1 tablet (25 mg total) by mouth at bedtime. 90 tablet 3   Multiple Vitamin (MULTIVITAMIN WITH MINERALS) TABS tablet Take 1 tablet by mouth daily.     Multiple Vitamins-Minerals (ICAPS AREDS 2 PO) Take 1 tablet by mouth daily.     pravastatin (PRAVACHOL) 40 MG tablet TAKE 1 TABLET EVERY EVENING 90 tablet 3   tamsulosin (FLOMAX) 0.4 MG CAPS capsule Take 0.4 mg by mouth every evening.     No current facility-administered medications for this encounter.    Atrial Fibrillation Management history:  Previous antiarrhythmic drugs: amiodarone Previous cardioversions:  Previous ablations:  Anticoagulation history: Eliquis   ROS- All systems are reviewed and negative except as per the HPI above.  Physical Exam: BP 120/60   Pulse 64   Ht 5\' 10"  (1.778 m)   Wt 74.7 kg   BMI 23.62 kg/m   GEN- The patient is well appearing, alert and oriented x 3 today.   Neck - no JVD or carotid bruit noted Lungs- Clear to ausculation bilaterally, normal work of breathing Heart- Regular  rate and rhythm, no murmurs, rubs or gallops, PMI not laterally displaced Extremities- no clubbing, cyanosis, or edema Skin - no rash or ecchymosis noted   EKG today demonstrates  Vent. rate 64 BPM PR interval 188 ms QRS duration 154 ms QT/QTcB 468/482 ms P-R-T axes 90 180 65  Suspect arm lead reversal, interpretation assumes no reversal Normal sinus rhythm Indeterminate axis Right bundle branch block Inferior infarct , age undetermined Anterolateral infarct , age undetermined Abnormal ECG When compared with ECG of 07-Jan-2023 08:38, PREVIOUS ECG IS  PRESENT  Echo 02/11/22 demonstrated  1. 3D EF - overestimated. Left ventricular ejection fraction, by  estimation, is 40 to 45%. Left ventricular ejection fraction by 3D volume  is 60 %. The left ventricle has mildly decreased function. The left  ventricle demonstrates regional wall motion  abnormalities (see scoring diagram/findings for description). Left  ventricular diastolic parameters are consistent with Grade I diastolic  dysfunction (impaired relaxation). There is severe akinesis of the left  ventricular, mid-apical anteroseptal wall  and septal wall. There is severe akinesis of the left ventricular, apical  lateral wall and inferolateral wall.   2. Right ventricular systolic function is normal. The right ventricular  size is normal.   3. Left atrial size was moderately dilated.   4. Right atrial size was severely dilated.   5. The mitral valve is normal in structure. Mild to moderate mitral valve  regurgitation. No evidence of mitral stenosis.   6. Tricuspid valve regurgitation is moderate to severe.   7. The aortic valve is calcified. There is mild calcification of the  aortic valve. There is mild thickening of the aortic valve. Aortic valve  regurgitation is not visualized. Aortic valve sclerosis/calcification is  present, without any evidence of  aortic stenosis.   8. The inferior vena cava is normal in size with greater than 50%  respiratory variability, suggesting right atrial pressure of 3 mmHg.    ASSESSMENT & PLAN CHA2DS2-VASc Score = 7  The patient's score is based upon: CHF History: 1 HTN History: 1 Diabetes History: 0 Stroke History: 2 Vascular Disease History: 1 Age Score: 2 Gender Score: 0       ASSESSMENT AND PLAN: Paroxysmal Atrial Fibrillation (ICD10:  I48.0) / Atrial flutter The patient's CHA2DS2-VASc score is 7, indicating a 11.2% annual risk of stroke.    He is currently in NSR.   Discussion with patient on treatment plan. After discussion,  he agrees to go back to lower dose of amiodarone 100 mg once daily. He will continue to use Kardiamobile at home to check his rhythm. He will f/u as scheduled with Dr. Jens Som.    Secondary Hypercoagulable State (ICD10:  D68.69) The patient is at significant risk for stroke/thromboembolism based upon his CHA2DS2-VASc Score of 7.  Continue Apixaban (Eliquis).  No missed doses.     Follow up as scheduled with Dr. Jens Som.    Lake Bells, PA-C  [facility] [address] [place], [place] [address] [phone]

## 2023-02-04 NOTE — Patient Instructions (Signed)
Reduce to amiodarone 100mg  daily

## 2023-02-22 DIAGNOSIS — I509 Heart failure, unspecified: Secondary | ICD-10-CM | POA: Diagnosis not present

## 2023-02-22 DIAGNOSIS — N1831 Chronic kidney disease, stage 3a: Secondary | ICD-10-CM | POA: Diagnosis not present

## 2023-02-22 DIAGNOSIS — Z79899 Other long term (current) drug therapy: Secondary | ICD-10-CM | POA: Diagnosis not present

## 2023-02-22 DIAGNOSIS — E039 Hypothyroidism, unspecified: Secondary | ICD-10-CM | POA: Diagnosis not present

## 2023-02-22 DIAGNOSIS — I130 Hypertensive heart and chronic kidney disease with heart failure and stage 1 through stage 4 chronic kidney disease, or unspecified chronic kidney disease: Secondary | ICD-10-CM | POA: Diagnosis not present

## 2023-02-22 DIAGNOSIS — R7989 Other specified abnormal findings of blood chemistry: Secondary | ICD-10-CM | POA: Diagnosis not present

## 2023-03-15 ENCOUNTER — Other Ambulatory Visit (HOSPITAL_COMMUNITY): Payer: Self-pay

## 2023-03-15 ENCOUNTER — Telehealth: Payer: Self-pay | Admitting: Cardiology

## 2023-03-15 MED ORDER — AMIODARONE HCL 100 MG PO TABS
100.0000 mg | ORAL_TABLET | Freq: Every day | ORAL | 0 refills | Status: DC
  Filled 2023-03-15: qty 90, 90d supply, fill #0

## 2023-03-15 NOTE — Telephone Encounter (Signed)
*  STAT* If patient is at the pharmacy, call can be transferred to refill team.   1. Which medications need to be refilled? (please list name of each medication and dose if known)   amiodarone (PACERONE) 100 MG tablet   2. Would you like to learn more about the convenience, safety, & potential cost savings by using the [facility]?   3. Are you open to using the [facility] (Type [facility]. ).  4. Which pharmacy/location (including street and city if local pharmacy) is medication to be sent to?  [facility] - [place], [place] - [address] [facility] AT Portal to Registered [facility] Sites   5. Do they need a 30 day or 90 day supply?   90 day  Patient stated he still has some medication.

## 2023-03-15 NOTE — Addendum Note (Signed)
Addended by: Daphane [place] on: 03/15/2023 10:57 AM   Modules accepted: Orders

## 2023-03-16 ENCOUNTER — Other Ambulatory Visit (HOSPITAL_COMMUNITY): Payer: Self-pay

## 2023-03-17 ENCOUNTER — Other Ambulatory Visit (HOSPITAL_COMMUNITY): Payer: Self-pay

## 2023-04-12 ENCOUNTER — Other Ambulatory Visit: Payer: Self-pay | Admitting: Cardiology

## 2023-04-12 DIAGNOSIS — L821 Other seborrheic keratosis: Secondary | ICD-10-CM | POA: Diagnosis not present

## 2023-04-12 DIAGNOSIS — D225 Melanocytic nevi of trunk: Secondary | ICD-10-CM | POA: Diagnosis not present

## 2023-04-12 DIAGNOSIS — Z08 Encounter for follow-up examination after completed treatment for malignant neoplasm: Secondary | ICD-10-CM | POA: Diagnosis not present

## 2023-04-12 DIAGNOSIS — L814 Other melanin hyperpigmentation: Secondary | ICD-10-CM | POA: Diagnosis not present

## 2023-04-12 DIAGNOSIS — L570 Actinic keratosis: Secondary | ICD-10-CM | POA: Diagnosis not present

## 2023-04-12 DIAGNOSIS — Z85828 Personal history of other malignant neoplasm of skin: Secondary | ICD-10-CM | POA: Diagnosis not present

## 2023-04-12 MED ORDER — ELIQUIS 5 MG PO TABS: 5.0000 mg | ORAL_TABLET | Freq: Two times a day (BID) | ORAL | 1 refills | Status: DC

## 2023-04-12 MED ORDER — FUROSEMIDE 40 MG PO TABS: ORAL_TABLET | ORAL | 2 refills | Status: DC

## 2023-04-12 NOTE — Telephone Encounter (Signed)
*  STAT* If patient is at the pharmacy, call can be transferred to refill team.   1. Which medications need to be refilled? (please list name of each medication and dose if known)   furosemide  (LASIX ) 40 MG tablet    ELIQUIS  5 MG TABS tablet   2. Which pharmacy/location (including street and city if local pharmacy) is medication to be sent to? [facility]  3. Do they need a 30 day or 90 day supply? 90

## 2023-04-12 NOTE — Telephone Encounter (Signed)
 Prescription refill request for Eliquis received. Indication:afib Last office visit:11/24 Scr:1.20 10/24 Age: 89 Weight:74.7  kg  Prescription refilled

## 2023-04-12 NOTE — Telephone Encounter (Signed)
 Pt's medication was sent to pt's pharmacy as requested. Confirmation received.

## 2023-04-28 DIAGNOSIS — H401121 Primary open-angle glaucoma, left eye, mild stage: Secondary | ICD-10-CM | POA: Diagnosis not present

## 2023-05-07 ENCOUNTER — Other Ambulatory Visit: Payer: Self-pay | Admitting: Cardiovascular Disease

## 2023-05-07 MED ORDER — METOPROLOL SUCCINATE ER 25 MG PO TB24: 25.0000 mg | ORAL_TABLET | Freq: Every day | ORAL | 2 refills | Status: DC

## 2023-06-01 DIAGNOSIS — E039 Hypothyroidism, unspecified: Secondary | ICD-10-CM | POA: Diagnosis not present

## 2023-06-01 DIAGNOSIS — I679 Cerebrovascular disease, unspecified: Secondary | ICD-10-CM | POA: Diagnosis not present

## 2023-06-01 DIAGNOSIS — D692 Other nonthrombocytopenic purpura: Secondary | ICD-10-CM | POA: Diagnosis not present

## 2023-06-01 DIAGNOSIS — I480 Paroxysmal atrial fibrillation: Secondary | ICD-10-CM | POA: Diagnosis not present

## 2023-06-01 DIAGNOSIS — M810 Age-related osteoporosis without current pathological fracture: Secondary | ICD-10-CM | POA: Diagnosis not present

## 2023-06-01 DIAGNOSIS — I509 Heart failure, unspecified: Secondary | ICD-10-CM | POA: Diagnosis not present

## 2023-06-01 DIAGNOSIS — J449 Chronic obstructive pulmonary disease, unspecified: Secondary | ICD-10-CM | POA: Diagnosis not present

## 2023-06-01 DIAGNOSIS — M5430 Sciatica, unspecified side: Secondary | ICD-10-CM | POA: Diagnosis not present

## 2023-06-01 DIAGNOSIS — I130 Hypertensive heart and chronic kidney disease with heart failure and stage 1 through stage 4 chronic kidney disease, or unspecified chronic kidney disease: Secondary | ICD-10-CM | POA: Diagnosis not present

## 2023-06-01 DIAGNOSIS — Z7901 Long term (current) use of anticoagulants: Secondary | ICD-10-CM | POA: Diagnosis not present

## 2023-06-01 DIAGNOSIS — N1831 Chronic kidney disease, stage 3a: Secondary | ICD-10-CM | POA: Diagnosis not present

## 2023-06-01 DIAGNOSIS — Z9861 Coronary angioplasty status: Secondary | ICD-10-CM | POA: Diagnosis not present

## 2023-06-22 ENCOUNTER — Telehealth: Payer: Self-pay | Admitting: Cardiology

## 2023-06-22 MED ORDER — AMIODARONE HCL 100 MG PO TABS: 100.0000 mg | ORAL_TABLET | Freq: Every day | ORAL | 2 refills | Status: DC

## 2023-06-22 NOTE — Telephone Encounter (Signed)
*  STAT* If patient is at the pharmacy, call can be transferred to refill team.   1. Which medications need to be refilled? (please list name of each medication and dose if known) amiodarone (PACERONE) 100 MG tablet   2. Which pharmacy/location (including street and city if local pharmacy) is medication to be sent to?  [facility] - [place], [place] - [address]     3. Do they need a 30 day or 90 day supply? 90 day

## 2023-09-03 ENCOUNTER — Other Ambulatory Visit: Payer: Self-pay | Admitting: Cardiology

## 2023-09-03 NOTE — Telephone Encounter (Signed)
 Prescription refill request for Eliquis  received. Indication:afib Last office visit:11/24 Scr:1.20  10/24 Age: 89 Weight:74.7  kg  Prescription refilled

## 2023-10-11 DIAGNOSIS — D492 Neoplasm of unspecified behavior of bone, soft tissue, and skin: Secondary | ICD-10-CM | POA: Diagnosis not present

## 2023-10-11 DIAGNOSIS — C44329 Squamous cell carcinoma of skin of other parts of face: Secondary | ICD-10-CM | POA: Diagnosis not present

## 2023-10-11 DIAGNOSIS — L570 Actinic keratosis: Secondary | ICD-10-CM | POA: Diagnosis not present

## 2023-10-18 NOTE — Progress Notes (Signed)
 HPI: FU CAD, CHF and atrial fibrillation. He is status post coronary bypassing graft in 1990; also with h/o ischemic cardiomyopathy, prior stroke and prior mural apical thrombus. Prior abdominal ultrasound in May 2006 showed no aneurysm. Carotid Dopplers in November of 2005 showed 0-39% bilaterally.  LHC 09/24/11: LM 30, oLAD 100%, dLAD fills via RV branch off RCA, IM diffuse 95% subtotal disease, mCFX 40%, OM1 normal, RCA normal with large RV branch supplying dLAD, PLA possibly occluded and most dRCA appears to fill from LIMA collats; LIMA patent but does not supply dLAD (connects via small vessel to ? DRCA/PLA, S-IM occluded). Circulation felt to be stable. Continued med Rx recommended. Nuclear study 2018 showed ejection fraction 46%. There was infarct but no ischemia.  Patient had a fall in April 2019 and suffered a small subdural hematoma.  He was ultimately also diagnosed with right pelvic fracture treated conservatively. CTA showed occlusion of the right popliteal artery of indeterminate chronicity; followed by vascular surgery.  Patient also with hypotension/orthostasis. Amiodarone  decreased to 100 mg daily October 2019 as patient complained of dizziness, tremors, and constipation. Echocardiogram November 2023 showed ejection fraction 40 to 45%, grade 1 diastolic dysfunction, moderate left atrial enlargement, severe right atrial enlargement, mild to moderate mitral regurgitation, moderate to severe tricuspid regurgitation.  Had cardioversion of atrial flutter January 2024.  Since last seen, he does have some dyspnea on exertion.  No orthopnea, PND, pedal edema, chest pain.  He continues to have difficulties with orthostatic symptoms.  Current Outpatient Medications  Medication Sig Dispense Refill   alendronate (FOSAMAX) 70 MG tablet Take 70 mg by mouth every Sunday. Take with a full glass of water  on an empty stomach.     amiodarone  (PACERONE ) 100 MG tablet Take 1 tablet (100 mg total) by mouth  daily. 90 tablet 2   Cholecalciferol  (VITAMIN D ) 2000 UNITS tablet Take 2,000 Units by mouth daily.     ELIQUIS  5 MG TABS tablet TAKE 1 TABLET BY MOUTH TWICE  DAILY 180 tablet 3   furosemide  (LASIX ) 40 MG tablet TAKE 2 TABLETS (80MG  TOTAL)TWO TIMES A DAY 360 tablet 2   levothyroxine  (SYNTHROID ) 25 MCG tablet TAKE 1 TABLET DAILY BEFORE BREAKFAST 90 tablet 2   loratadine  (CLARITIN ) 10 MG tablet Take 10 mg by mouth daily.     metoprolol  succinate (TOPROL  XL) 25 MG 24 hr tablet Take 1 tablet (25 mg total) by mouth at bedtime. 90 tablet 2   Multiple Vitamin (MULTIVITAMIN WITH MINERALS) TABS tablet Take 1 tablet by mouth daily.     Multiple Vitamins-Minerals (ICAPS AREDS 2 PO) Take 1 tablet by mouth daily.     pravastatin  (PRAVACHOL ) 40 MG tablet TAKE 1 TABLET EVERY EVENING 90 tablet 3   tamsulosin  (FLOMAX ) 0.4 MG CAPS capsule Take 0.4 mg by mouth every evening.     No current facility-administered medications for this visit.     Past Medical History:  Diagnosis Date   Asthma    with worsening with beta blockade   Atrial fibrillation (HCC)    CAD (coronary artery disease)    CVA (cerebral infarction) 2012   tia, mild no residual defecits   GI bleed 8 yrs ago   due to doll fully vessel which was clipped   Glaucoma    excellent cataracts   History of kidney stones    Ischemic cardiomyopathy    Moderate to severe mitral regurgitation 06/16/2017   Nephrolithiasis    Post-infarction apical thrombus (HCC)  Prostate cancer [facility]) 2012   Radiation proctitis Feb 2015   treated with APC   Tubular adenoma 04/2013   Dr. Albertus    Past Surgical History:  Procedure Laterality Date   cardiac stents  2003   CARDIOVERSION N/A 06/15/2017   Procedure: CARDIOVERSION;  Surgeon: Alveta, Aleene PARAS, MD;  Location: [facility] ENDOSCOPY;  Service: Cardiovascular;  Laterality: N/A;   CARDIOVERSION N/A 07/15/2017   Procedure: CARDIOVERSION;  Surgeon: Jeffrie Oneil BROCKS, MD;  Location: [facility] ENDOSCOPY;  Service:  Cardiovascular;  Laterality: N/A;   CARDIOVERSION N/A 07/20/2017   Procedure: CARDIOVERSION;  Surgeon: Charls Pearla LABOR, MD;  Location: [facility] ENDOSCOPY;  Service: Cardiovascular;  Laterality: N/A;   CARDIOVERSION N/A 11/23/2017   Procedure: CARDIOVERSION;  Surgeon: Pietro Redell RAMAN, MD;  Location: [facility] ENDOSCOPY;  Service: Cardiovascular;  Laterality: N/A;   CARDIOVERSION N/A 06/03/2021   Procedure: CARDIOVERSION;  Surgeon: Kate Lonni CROME, MD;  Location: [facility] ENDOSCOPY;  Service: Cardiovascular;  Laterality: N/A;   CARDIOVERSION N/A 04/03/2022   Procedure: CARDIOVERSION;  Surgeon: Francyne Headland, MD;  Location: [facility] ENDOSCOPY;  Service: Cardiovascular;  Laterality: N/A;   COLONOSCOPY WITH PROPOFOL  N/A 05/05/2013   Procedure: COLONOSCOPY WITH PROPOFOL ;  Surgeon: Gordy CHRISTELLA Albertus, MD;  Location: [facility] ENDOSCOPY;  Service: Gastroenterology;  Laterality: N/A;   CORONARY ARTERY BYPASS GRAFT  1998   LIMA to the LAD and saphenous vein graft tothe diagonal   EXTRACORPOREAL SHOCK WAVE LITHOTRIPSY Right 04/29/2017   Procedure: RIGHT EXTRACORPOREAL SHOCK WAVE LITHOTRIPSY (ESWL);  Surgeon: Alvaro Hummer, MD;  Location: [facility] ORS;  Service: Urology;  Laterality: Right;   history of radiation treatment  2012   x 40 treatments done   KNEE ARTHROSCOPY  2016 or 2017   Dr Heide ;    PARTIAL KNEE ARTHROPLASTY Right 01/13/2017   Procedure: UNICOMPARTMENTAL RIGHT KNEE;  Surgeon: Melodi Lerner, MD;  Location: [facility] ORS;  Service: Orthopedics;  Laterality: Right;  with block    Social History   Socioeconomic History   Marital status: Widowed    Spouse name: Not on file   Number of children: 3   Years of education: Not on file   Highest education level: Not on file  Occupational History   Occupation: Retired Equities trader  Tobacco Use   Smoking status: Never   Smokeless tobacco: Never  Vaping Use   Vaping status: Never Used  Substance and Sexual Activity   Alcohol  use: Yes    Alcohol /week: 0.0 standard  drinks of alcohol     Comment: occasional beer or wine   Drug use: No   Sexual activity: Not Currently    Birth control/protection: Abstinence  Other Topics Concern   Not on file  Social History Narrative   Not on file   Social Drivers of Health   Financial Resource Strain: Not on file  Food Insecurity: Not on file  Transportation Needs: Not on file  Physical Activity: Not on file  Stress: Not on file  Social Connections: Not on file  Intimate Partner Violence: Not on file    Family History  Problem Relation Age of Onset   Heart attack Father    Heart disease Mother    Stomach cancer Mother     ROS: no fevers or chills, productive cough, hemoptysis, dysphasia, odynophagia, melena, hematochezia, dysuria, hematuria, rash, seizure activity, orthopnea, PND, pedal edema, claudication. Remaining systems are negative.  Physical Exam: Well-developed well-nourished in no acute distress.  Skin is warm and dry.  HEENT is normal.  Neck is supple.  Chest is clear to auscultation with normal expansion.  Cardiovascular exam is regular rate and rhythm.  Abdominal exam nontender or distended. No masses palpated. Extremities show no edema. neuro grossly intact  EKG Interpretation Date/Time:  Wednesday October 27 2023 08:37:30 EDT Ventricular Rate:  65 PR Interval:  178 QRS Duration:  140 QT Interval:  462 QTC Calculation: 480 R Axis:   -39  Text Interpretation: Sinus rhythm with occasional and consecutive Premature ventricular complexes and Fusion complexes Left axis deviation Right bundle branch block Possible Lateral infarct Inferior infarct Confirmed by Pietro Rogue ([id number]) on 10/27/2023 8:40:40 AM    A/P  1 paroxysmal atrial fibrillation/flutter-patient remains in sinus rhythm.  Continue amiodarone  at present dose.  Continue apixaban .  Given continued orthostatic symptoms I will discontinue Toprol  to allow his blood pressure to run higher.  Check chest x-ray.  Will have most  recent liver functions, TSH, hemoglobin and renal function forwarded to us  from primary care.  2 chronic combined systolic/diastolic congestive heart failure-he appears to be euvolemic on examination today.  Continue diuretic at present dose.  Repeat echocardiogram.  3 coronary artery disease-he denies chest pain.  Continue statin.  4 ischemic cardiomyopathy-patient has had significant difficulties with orthostasis in the past.  He is therefore not on Entresto or an ARB.  His symptoms also persist and I will therefore discontinue Toprol  to allow his blood pressure to run higher.  5 hyperlipidemia-continue statin.    6 prior apical thrombus-continue apixaban .  7 history of valvular heart disease-given age would like to be conservative.  Will repeat echocardiogram.  8 peripheral vascular disease-Per vascular surgery.  Rogue Pietro, MD

## 2023-10-27 ENCOUNTER — Ambulatory Visit (HOSPITAL_COMMUNITY)
Admission: RE | Admit: 2023-10-27 | Discharge: 2023-10-27 | Disposition: A | Discharge status: Home / Self Care | Source: Ambulatory Visit | Attending: Cardiology | Admitting: Cardiology

## 2023-10-27 ENCOUNTER — Ambulatory Visit: Admitting: Cardiology

## 2023-10-27 ENCOUNTER — Encounter: Payer: Self-pay | Admitting: Cardiology

## 2023-10-27 VITALS — BP 118/68 | HR 65 | Ht 69.5 in | Wt 161.6 lb

## 2023-10-27 DIAGNOSIS — I480 Paroxysmal atrial fibrillation: Secondary | ICD-10-CM | POA: Diagnosis not present

## 2023-10-27 DIAGNOSIS — J439 Emphysema, unspecified: Secondary | ICD-10-CM | POA: Diagnosis not present

## 2023-10-27 DIAGNOSIS — I5042 Chronic combined systolic (congestive) and diastolic (congestive) heart failure: Secondary | ICD-10-CM | POA: Diagnosis not present

## 2023-10-27 DIAGNOSIS — I340 Nonrheumatic mitral (valve) insufficiency: Secondary | ICD-10-CM | POA: Diagnosis not present

## 2023-10-27 DIAGNOSIS — I5022 Chronic systolic (congestive) heart failure: Secondary | ICD-10-CM | POA: Insufficient documentation

## 2023-10-27 DIAGNOSIS — I2510 Atherosclerotic heart disease of native coronary artery without angina pectoris: Secondary | ICD-10-CM

## 2023-10-27 NOTE — Patient Instructions (Signed)
 Medication Instructions:  Your physician has recommended you make the following change in your medication:   -Stop metoprolol  succinate (Toprol -XL).  *If you need a refill on your cardiac medications before your next appointment, please call your pharmacy*  Testing/Procedures: The chest X ray has been ordered ---go to the 2nd floor to have this done.  Your physician has requested that you have an echocardiogram. Echocardiography is a painless test that uses sound waves to create images of your heart. It provides your doctor with information about the size and shape of your heart and how well your heart's chambers and valves are working. This procedure takes approximately one hour. There are no restrictions for this procedure. Please do NOT wear cologne, perfume, aftershave, or lotions (deodorant is allowed). Please arrive 15 minutes prior to your appointment time.  Please note: We ask at that you not bring children with you during ultrasound (echo/ vascular) testing. Due to room size and safety concerns, children are not allowed in the ultrasound rooms during exams. Our front office staff cannot provide observation of children in our lobby area while testing is being conducted. An adult accompanying a patient to their appointment will only be allowed in the ultrasound room at the discretion of the ultrasound technician under special circumstances. We apologize for any inconvenience.   Follow-Up: At [facility], you and your health needs are our priority.  As part of our continuing mission to provide you with exceptional heart care, our providers are all part of one team.  This team includes your primary Cardiologist (physician) and Advanced Practice Providers or APPs (Physician Assistants and Nurse Practitioners) who all work together to provide you with the care you need, when you need it.  Your next appointment:   6 month(s)  Provider:   Redell Shallow, MD    We recommend  signing up for the patient portal called MyChart.  Sign up information is provided on this After Visit Summary.  MyChart is used to connect with patients for Virtual Visits (Telemedicine).  Patients are able to view lab/test results, encounter notes, upcoming appointments, etc.  Non-urgent messages can be sent to your provider as well.   To learn more about what you can do with MyChart, go to [email or web address].

## 2023-11-03 ENCOUNTER — Ambulatory Visit: Payer: Self-pay | Admitting: Cardiology

## 2023-11-05 DIAGNOSIS — M79605 Pain in left leg: Secondary | ICD-10-CM | POA: Diagnosis not present

## 2023-11-26 DIAGNOSIS — S82812D Torus fracture of upper end of left fibula, subsequent encounter for fracture with routine healing: Secondary | ICD-10-CM | POA: Diagnosis not present

## 2023-12-02 DIAGNOSIS — E039 Hypothyroidism, unspecified: Secondary | ICD-10-CM | POA: Diagnosis not present

## 2023-12-02 DIAGNOSIS — E559 Vitamin D deficiency, unspecified: Secondary | ICD-10-CM | POA: Diagnosis not present

## 2023-12-02 DIAGNOSIS — Z125 Encounter for screening for malignant neoplasm of prostate: Secondary | ICD-10-CM | POA: Diagnosis not present

## 2023-12-03 DIAGNOSIS — N1831 Chronic kidney disease, stage 3a: Secondary | ICD-10-CM | POA: Diagnosis not present

## 2023-12-03 DIAGNOSIS — I509 Heart failure, unspecified: Secondary | ICD-10-CM | POA: Diagnosis not present

## 2023-12-03 DIAGNOSIS — E039 Hypothyroidism, unspecified: Secondary | ICD-10-CM | POA: Diagnosis not present

## 2023-12-03 DIAGNOSIS — I130 Hypertensive heart and chronic kidney disease with heart failure and stage 1 through stage 4 chronic kidney disease, or unspecified chronic kidney disease: Secondary | ICD-10-CM | POA: Diagnosis not present

## 2023-12-03 DIAGNOSIS — R7301 Impaired fasting glucose: Secondary | ICD-10-CM | POA: Diagnosis not present

## 2023-12-06 ENCOUNTER — Ambulatory Visit (HOSPITAL_COMMUNITY)
Admission: RE | Admit: 2023-12-06 | Discharge: 2023-12-06 | Disposition: A | Discharge status: Home / Self Care | Source: Ambulatory Visit | Attending: Internal Medicine | Admitting: Internal Medicine

## 2023-12-06 DIAGNOSIS — I5022 Chronic systolic (congestive) heart failure: Secondary | ICD-10-CM | POA: Diagnosis not present

## 2023-12-06 DIAGNOSIS — I340 Nonrheumatic mitral (valve) insufficiency: Secondary | ICD-10-CM | POA: Insufficient documentation

## 2023-12-06 DIAGNOSIS — I5042 Chronic combined systolic (congestive) and diastolic (congestive) heart failure: Secondary | ICD-10-CM | POA: Diagnosis present

## 2023-12-06 DIAGNOSIS — I2510 Atherosclerotic heart disease of native coronary artery without angina pectoris: Secondary | ICD-10-CM | POA: Diagnosis present

## 2023-12-06 DIAGNOSIS — I480 Paroxysmal atrial fibrillation: Secondary | ICD-10-CM | POA: Insufficient documentation

## 2023-12-06 LAB — ECHOCARDIOGRAM COMPLETE
AR max vel: 1.4 cm2
AV Area VTI: 1.33 cm2
AV Area mean vel: 1.31 cm2
AV Mean grad: 7.5 mmHg
AV Peak grad: 13.7 mmHg
Ao pk vel: 1.85 m/s
Area-P 1/2: 2.11 cm2
MV M vel: 4.78 m/s
MV Peak grad: 91.4 mmHg
P 1/2 time: 1547 ms
S' Lateral: 3.4 cm

## 2023-12-06 MED ORDER — PERFLUTREN LIPID MICROSPHERE
1.0000 mL | INTRAVENOUS | Status: AC | PRN
  Administered 2023-12-06: 2 mL via INTRAVENOUS

## 2023-12-08 DIAGNOSIS — I509 Heart failure, unspecified: Secondary | ICD-10-CM | POA: Diagnosis not present

## 2023-12-08 DIAGNOSIS — R82998 Other abnormal findings in urine: Secondary | ICD-10-CM | POA: Diagnosis not present

## 2023-12-08 DIAGNOSIS — M810 Age-related osteoporosis without current pathological fracture: Secondary | ICD-10-CM | POA: Diagnosis not present

## 2023-12-08 DIAGNOSIS — J449 Chronic obstructive pulmonary disease, unspecified: Secondary | ICD-10-CM | POA: Diagnosis not present

## 2023-12-08 DIAGNOSIS — N1831 Chronic kidney disease, stage 3a: Secondary | ICD-10-CM | POA: Diagnosis not present

## 2023-12-08 DIAGNOSIS — Z0000 Encounter for general adult medical examination without abnormal findings: Secondary | ICD-10-CM | POA: Diagnosis not present

## 2023-12-08 DIAGNOSIS — I679 Cerebrovascular disease, unspecified: Secondary | ICD-10-CM | POA: Diagnosis not present

## 2023-12-08 DIAGNOSIS — H409 Unspecified glaucoma: Secondary | ICD-10-CM | POA: Diagnosis not present

## 2023-12-08 DIAGNOSIS — Z8546 Personal history of malignant neoplasm of prostate: Secondary | ICD-10-CM | POA: Diagnosis not present

## 2023-12-08 DIAGNOSIS — Z7901 Long term (current) use of anticoagulants: Secondary | ICD-10-CM | POA: Diagnosis not present

## 2023-12-08 DIAGNOSIS — E039 Hypothyroidism, unspecified: Secondary | ICD-10-CM | POA: Diagnosis not present

## 2023-12-08 DIAGNOSIS — M5430 Sciatica, unspecified side: Secondary | ICD-10-CM | POA: Diagnosis not present

## 2023-12-08 DIAGNOSIS — I480 Paroxysmal atrial fibrillation: Secondary | ICD-10-CM | POA: Diagnosis not present

## 2023-12-08 DIAGNOSIS — Z1331 Encounter for screening for depression: Secondary | ICD-10-CM | POA: Diagnosis not present

## 2023-12-08 DIAGNOSIS — I130 Hypertensive heart and chronic kidney disease with heart failure and stage 1 through stage 4 chronic kidney disease, or unspecified chronic kidney disease: Secondary | ICD-10-CM | POA: Diagnosis not present

## 2023-12-08 DIAGNOSIS — N401 Enlarged prostate with lower urinary tract symptoms: Secondary | ICD-10-CM | POA: Diagnosis not present

## 2023-12-08 DIAGNOSIS — Z1339 Encounter for screening examination for other mental health and behavioral disorders: Secondary | ICD-10-CM | POA: Diagnosis not present

## 2023-12-08 DIAGNOSIS — R7301 Impaired fasting glucose: Secondary | ICD-10-CM | POA: Diagnosis not present

## 2023-12-15 DIAGNOSIS — Z23 Encounter for immunization: Secondary | ICD-10-CM | POA: Diagnosis not present

## 2023-12-24 ENCOUNTER — Other Ambulatory Visit: Payer: Self-pay | Admitting: Cardiology

## 2023-12-25 ENCOUNTER — Emergency Department (HOSPITAL_COMMUNITY)

## 2023-12-25 ENCOUNTER — Observation Stay (HOSPITAL_COMMUNITY)
Admission: EM | Admit: 2023-12-25 | Discharge: 2023-12-27 | Disposition: A | Discharge status: Home / Self Care | Attending: Family Medicine | Admitting: Family Medicine

## 2023-12-25 ENCOUNTER — Encounter (HOSPITAL_COMMUNITY): Payer: Self-pay

## 2023-12-25 ENCOUNTER — Other Ambulatory Visit: Payer: Self-pay

## 2023-12-25 DIAGNOSIS — N400 Enlarged prostate without lower urinary tract symptoms: Secondary | ICD-10-CM | POA: Diagnosis not present

## 2023-12-25 DIAGNOSIS — S12501A Unspecified nondisplaced fracture of sixth cervical vertebra, initial encounter for closed fracture: Secondary | ICD-10-CM

## 2023-12-25 DIAGNOSIS — S62232A Other displaced fracture of base of first metacarpal bone, left hand, initial encounter for closed fracture: Secondary | ICD-10-CM | POA: Diagnosis present

## 2023-12-25 DIAGNOSIS — S12500A Unspecified displaced fracture of sixth cervical vertebra, initial encounter for closed fracture: Secondary | ICD-10-CM | POA: Diagnosis present

## 2023-12-25 DIAGNOSIS — S0083XA Contusion of other part of head, initial encounter: Secondary | ICD-10-CM | POA: Diagnosis not present

## 2023-12-25 DIAGNOSIS — J9811 Atelectasis: Secondary | ICD-10-CM | POA: Diagnosis not present

## 2023-12-25 DIAGNOSIS — L8990 Pressure ulcer of unspecified site, unspecified stage: Secondary | ICD-10-CM | POA: Insufficient documentation

## 2023-12-25 DIAGNOSIS — J45909 Unspecified asthma, uncomplicated: Secondary | ICD-10-CM | POA: Diagnosis not present

## 2023-12-25 DIAGNOSIS — N179 Acute kidney failure, unspecified: Secondary | ICD-10-CM | POA: Diagnosis not present

## 2023-12-25 DIAGNOSIS — I4891 Unspecified atrial fibrillation: Secondary | ICD-10-CM | POA: Diagnosis not present

## 2023-12-25 DIAGNOSIS — I5022 Chronic systolic (congestive) heart failure: Secondary | ICD-10-CM | POA: Insufficient documentation

## 2023-12-25 DIAGNOSIS — I110 Hypertensive heart disease with heart failure: Secondary | ICD-10-CM | POA: Insufficient documentation

## 2023-12-25 DIAGNOSIS — S12591A Other nondisplaced fracture of sixth cervical vertebra, initial encounter for closed fracture: Principal | ICD-10-CM | POA: Insufficient documentation

## 2023-12-25 DIAGNOSIS — I2510 Atherosclerotic heart disease of native coronary artery without angina pectoris: Secondary | ICD-10-CM | POA: Diagnosis not present

## 2023-12-25 DIAGNOSIS — S0093XA Contusion of unspecified part of head, initial encounter: Secondary | ICD-10-CM | POA: Insufficient documentation

## 2023-12-25 DIAGNOSIS — M1812 Unilateral primary osteoarthritis of first carpometacarpal joint, left hand: Secondary | ICD-10-CM | POA: Diagnosis not present

## 2023-12-25 DIAGNOSIS — E039 Hypothyroidism, unspecified: Secondary | ICD-10-CM | POA: Diagnosis not present

## 2023-12-25 DIAGNOSIS — S62235A Other nondisplaced fracture of base of first metacarpal bone, left hand, initial encounter for closed fracture: Secondary | ICD-10-CM | POA: Insufficient documentation

## 2023-12-25 DIAGNOSIS — I480 Paroxysmal atrial fibrillation: Secondary | ICD-10-CM | POA: Diagnosis not present

## 2023-12-25 DIAGNOSIS — M1811 Unilateral primary osteoarthritis of first carpometacarpal joint, right hand: Secondary | ICD-10-CM | POA: Diagnosis not present

## 2023-12-25 DIAGNOSIS — S62522A Displaced fracture of distal phalanx of left thumb, initial encounter for closed fracture: Secondary | ICD-10-CM | POA: Diagnosis not present

## 2023-12-25 DIAGNOSIS — Y9301 Activity, walking, marching and hiking: Secondary | ICD-10-CM | POA: Insufficient documentation

## 2023-12-25 DIAGNOSIS — W19XXXA Unspecified fall, initial encounter: Secondary | ICD-10-CM | POA: Diagnosis not present

## 2023-12-25 DIAGNOSIS — R42 Dizziness and giddiness: Secondary | ICD-10-CM | POA: Diagnosis not present

## 2023-12-25 DIAGNOSIS — R55 Syncope and collapse: Secondary | ICD-10-CM

## 2023-12-25 DIAGNOSIS — F10920 Alcohol use, unspecified with intoxication, uncomplicated: Secondary | ICD-10-CM | POA: Insufficient documentation

## 2023-12-25 DIAGNOSIS — S0181XA Laceration without foreign body of other part of head, initial encounter: Secondary | ICD-10-CM | POA: Diagnosis not present

## 2023-12-25 LAB — BASIC METABOLIC PANEL WITH GFR
Anion gap: 11 (ref 5–15)
BUN: 29 mg/dL — ABNORMAL HIGH (ref 8–23)
CO2: 27 mmol/L (ref 22–32)
Calcium: 9.2 mg/dL (ref 8.9–10.3)
Chloride: 100 mmol/L (ref 98–111)
Creatinine, Ser: 1.14 mg/dL (ref 0.61–1.24)
GFR, Estimated: 59 mL/min — ABNORMAL LOW (ref >60)
Glucose, Bld: 109 mg/dL — ABNORMAL HIGH (ref 70–99)
Potassium: 3.7 mmol/L (ref 3.5–5.1)
Sodium: 138 mmol/L (ref 135–145)

## 2023-12-25 LAB — CBC
HCT: 41.4 % (ref 39.0–52.0)
Hemoglobin: 13.6 g/dL (ref 13.0–17.0)
MCH: 32.2 pg (ref 26.0–34.0)
MCHC: 32.9 g/dL (ref 30.0–36.0)
MCV: 97.9 fL (ref 80.0–100.0)
Platelets: 257 K/uL (ref 150–400)
RBC: 4.23 MIL/uL (ref 4.22–5.81)
RDW: 13.6 % (ref 11.5–15.5)
WBC: 8 K/uL (ref 4.0–10.5)
nRBC: 0 % (ref 0.0–0.2)

## 2023-12-25 LAB — I-STAT CHEM 8, ED
BUN: 30 mg/dL — ABNORMAL HIGH (ref 8–23)
Calcium, Ion: 1.13 mmol/L — ABNORMAL LOW (ref 1.15–1.40)
Chloride: 99 mmol/L (ref 98–111)
Creatinine, Ser: 1.3 mg/dL — ABNORMAL HIGH (ref 0.61–1.24)
Glucose, Bld: 110 mg/dL — ABNORMAL HIGH (ref 70–99)
HCT: 43 % (ref 39.0–52.0)
Hemoglobin: 14.6 g/dL (ref 13.0–17.0)
Potassium: 3.7 mmol/L (ref 3.5–5.1)
Sodium: 139 mmol/L (ref 135–145)
TCO2: 27 mmol/L (ref 22–32)

## 2023-12-25 LAB — TROPONIN I (HIGH SENSITIVITY): Troponin I (High Sensitivity): 12 ng/L (ref <18)

## 2023-12-25 MED ORDER — ACETAMINOPHEN 500 MG PO TABS
1000.0000 mg | ORAL_TABLET | Freq: Once | ORAL | Status: AC
  Administered 2023-12-25: 1000 mg via ORAL

## 2023-12-25 NOTE — ED Notes (Signed)
 ED Provider at bedside.

## 2023-12-25 NOTE — ED Provider Notes (Signed)
 [name] [facility] AT [facility] Provider Note   CSN: [id number] Arrival date & time: 12/25/23  2210     Patient presents with: Donald [name] is a 89 y.o. male with history of A-fib, on amiodarone  and Eliquis , hypothyroidism, presented to ED with a fall and head injury.  Patient was reported leaving [employer] music call when he reports feeling suddenly weak like his legs couldn't support him and then briefly lost consciousness, falling and hitting his head.  He arrives in C-spine collar placed by EMS.  EMS gave the patient 200 cc of fluid en route to the hospital as his initial blood pressure was 94, subsequently was 136/94.  Patient reports some soreness in both of his hands where he thinks he caught his fall.  His tetanus is up-to-date   HPI     Prior to Admission medications   Medication Sig Start Date End Date Taking? Authorizing Provider  alendronate (FOSAMAX) 70 MG tablet Take 70 mg by mouth every Sunday. Take with a full glass of water  on an empty stomach.    [provider]  amiodarone  (PACERONE ) 100 MG tablet Take 1 tablet (100 mg total) by mouth daily. 06/22/23   Pietro Redell RAMAN, MD  Cholecalciferol  (VITAMIN D ) 2000 UNITS tablet Take 2,000 Units by mouth daily.    [provider]  ELIQUIS  5 MG TABS tablet TAKE 1 TABLET BY MOUTH TWICE  DAILY 09/03/23   Pietro Redell RAMAN, MD  furosemide  (LASIX ) 40 MG tablet TAKE 2 TABLETS BY MOUTH TWICE  DAILY 12/24/23   Pietro Redell RAMAN, MD  levothyroxine  (SYNTHROID ) 25 MCG tablet TAKE 1 TABLET DAILY BEFORE BREAKFAST 03/25/21   Burnard Debby LABOR, MD  loratadine  (CLARITIN ) 10 MG tablet Take 10 mg by mouth daily.    [provider]  Multiple Vitamin (MULTIVITAMIN WITH MINERALS) TABS tablet Take 1 tablet by mouth daily.    [provider]  Multiple Vitamins-Minerals (ICAPS AREDS 2 PO) Take 1 tablet by mouth daily.    [provider]  pravastatin  (PRAVACHOL ) 40 MG tablet TAKE 1  TABLET EVERY EVENING 03/25/21   Burnard Debby LABOR, MD  tamsulosin  (FLOMAX ) 0.4 MG CAPS capsule Take 0.4 mg by mouth every evening.    [provider]    Allergies: Beta adrenergic blockers, Keflex [cephalexin], and Morphine    Review of Systems  Updated Vital Signs BP 110/65   Pulse (!) 46   Temp 97.7 F (36.5 C) (Oral)   Resp (!) 30   Ht 5' 9 (1.753 m)   Wt 68 kg   SpO2 98%   BMI 22.15 kg/m   Physical Exam Constitutional:      General: He is not in acute distress. HENT:     Head: Normocephalic.     Comments: Hematoma to the middle of the forehead and small contusion with minimal bleeding to the tip of the nose Eyes:     Conjunctiva/sclera: Conjunctivae normal.     Pupils: Pupils are equal, round, and reactive to light.  Neck:     Comments: C spine collar in place Cardiovascular:     Rate and Rhythm: Normal rate. Rhythm irregular.  Pulmonary:     Effort: Pulmonary effort is normal. No respiratory distress.  Abdominal:     General: There is no distension.     Tenderness: There is no abdominal tenderness.  Musculoskeletal:     Comments: TTP at base of left thumb, full ROM of the digits  Mild ttp of the 2nd and 3rd MCP joint right hand  Skin:    General: Skin is warm and dry.  Neurological:     General: No focal deficit present.     Mental Status: He is alert. Mental status is at baseline.  Psychiatric:        Mood and Affect: Mood normal.        Behavior: Behavior normal.     (all labs ordered are listed, but only abnormal results are displayed) Labs Reviewed  BASIC METABOLIC PANEL WITH GFR - Abnormal; Notable for the following components:      Result Value   Glucose, Bld 109 (*)    BUN 29 (*)    GFR, Estimated 59 (*)    All other components within normal limits  I-STAT CHEM 8, ED - Abnormal; Notable for the following components:   BUN 30 (*)    Creatinine, Ser 1.30 (*)    Glucose, Bld 110 (*)    Calcium, Ion 1.13 (*)    All other components  within normal limits  RESP PANEL BY RT-PCR (RSV, FLU A&B, COVID)  [id number]  CBC  TROPONIN I (HIGH SENSITIVITY)    EKG: EKG Interpretation Date/Time:  Saturday December 25 2023 22:38:37 EDT Ventricular Rate:  57 PR Interval:  195 QRS Duration:  177 QT Interval:  454 QTC Calculation: 443 R Axis:   55  Text Interpretation: Sinus or ectopic atrial rhythm Supraventricular bigeminy Right bundle branch block Confirmed by Cottie Cough [phone]) on 12/25/2023 10:42:29 PM  Radiology: CT Head Wo Contrast Result Date: 12/25/2023 EXAM: CT HEAD AND CERVICAL SPINE 12/25/2023 10:42:32 PM TECHNIQUE: CT of the head and cervical spine was performed without the administration of intravenous contrast. Multiplanar reformatted images are provided for review. Automated exposure control, iterative reconstruction, and/or weight based adjustment of the mA/kV was utilized to reduce the radiation dose to as low as reasonably achievable. COMPARISON: CT head made 12:29 and CT cervical spine 07/24/2017. CLINICAL HISTORY: Polytrauma, blunt. Syncope, fall and hit head, on thinners, hematoma and laceration to forehead. FINDINGS: CT HEAD BRAIN AND VENTRICLES: No acute intracranial hemorrhage. No mass effect or midline shift. No abnormal extra-axial fluid collection. 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 No hydrocephalus. ORBITS: No acute abnormality. SINUSES AND MASTOIDS: Mucosal thickening and frothy debris in the ethmoid air cells and maxillary and sphenoid sinuses. SOFT TISSUES AND SKULL: Central forehead hematoma. No calvarial fracture. Question nondisplaced bilateral nasal bone fractures. CT CERVICAL SPINE BONES AND ALIGNMENT: Acute nondisplaced fracture through the anterior inferior C6 vertebral body. No traumatic malalignment. DEGENERATIVE CHANGES: Multilevel spondylosis, disc space height loss, and degenerative endplate changes greatest at C5-C6 and C6-C7 where they are advanced. Posterior disc osteophyte  complex at C5-C6 causes mild-to-moderate effacement of the ventral thecal sac. SOFT TISSUES: No prevertebral soft tissue swelling. IMPRESSION: 1. No acute intracranial abnormality. 2. Acute nondisplaced oblique fracture through the anterior inferior C6 vertebral body. 3. Question nondisplaced bilateral nasal bone fractures. 4. Central forehead hematoma. No calvarial fracture. Electronically signed by: Norman Gatlin MD 12/25/2023 11:06 PM EDT RP Workstation: [id number]   CT Cervical Spine Wo Contrast Result Date: 12/25/2023 EXAM: CT HEAD AND CERVICAL SPINE 12/25/2023 10:42:32 PM TECHNIQUE: CT of the head and cervical spine was performed without the administration of intravenous contrast. Multiplanar reformatted images are provided for review. Automated exposure control, iterative reconstruction, and/or weight based adjustment of the mA/kV was utilized to reduce the radiation dose to as low as reasonably achievable. COMPARISON: CT  head made 12:29 and CT cervical spine 07/24/2017. CLINICAL HISTORY: Polytrauma, blunt. Syncope, fall and hit head, on thinners, hematoma and laceration to forehead. FINDINGS: CT HEAD BRAIN AND VENTRICLES: No acute intracranial hemorrhage. No mass effect or midline shift. No abnormal extra-axial fluid collection. 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 No hydrocephalus. ORBITS: No acute abnormality. SINUSES AND MASTOIDS: Mucosal thickening and frothy debris in the ethmoid air cells and maxillary and sphenoid sinuses. SOFT TISSUES AND SKULL: Central forehead hematoma. No calvarial fracture. Question nondisplaced bilateral nasal bone fractures. CT CERVICAL SPINE BONES AND ALIGNMENT: Acute nondisplaced fracture through the anterior inferior C6 vertebral body. No traumatic malalignment. DEGENERATIVE CHANGES: Multilevel spondylosis, disc space height loss, and degenerative endplate changes greatest at C5-C6 and C6-C7 where they are advanced. Posterior disc osteophyte complex at  C5-C6 causes mild-to-moderate effacement of the ventral thecal sac. SOFT TISSUES: No prevertebral soft tissue swelling. IMPRESSION: 1. No acute intracranial abnormality. 2. Acute nondisplaced oblique fracture through the anterior inferior C6 vertebral body. 3. Question nondisplaced bilateral nasal bone fractures. 4. Central forehead hematoma. No calvarial fracture. Electronically signed by: Norman Gatlin MD 12/25/2023 11:06 PM EDT RP Workstation: [id number]   DG Chest Portable 1 View Result Date: 12/25/2023 CLINICAL DATA:  Syncope. EXAM: PORTABLE CHEST 1 VIEW COMPARISON:  October 27, 2023 FINDINGS: Multiple sternal wires and vascular clips are seen. The heart size and mediastinal contours are within normal limits. There is marked severity calcification of the aortic arch. Mild atelectasis is seen within the left lung base. No pleural effusion or pneumothorax is identified. Multilevel degenerative changes are present throughout the thoracic spine. IMPRESSION: 1. Evidence of prior median sternotomy/CABG. 2. Mild left basilar atelectasis. Electronically Signed   By: Suzen Dials M.D.   On: 12/25/2023 22:56   DG Hand Complete Right Result Date: 12/25/2023 CLINICAL DATA:  Syncope. EXAM: RIGHT HAND - COMPLETE 3+ VIEW COMPARISON:  None Available. FINDINGS: There is no evidence of an acute fracture or dislocation. Mild chronic changes are seen involving the proximal portions of the fourth and fifth right metacarpals. Marked severity degenerative changes are present involving the first carpometacarpal and adjacent portions of the right triquetrum bone and base of the first right metacarpal. Additional degenerative changes are seen involving numerous interphalangeal joints. Soft tissues are unremarkable. IMPRESSION: 1. No acute fracture or dislocation. 2. Marked severity degenerative changes, as described above. Electronically Signed   By: Suzen Dials M.D.   On: 12/25/2023 22:54   DG Hand Complete  Left Result Date: 12/25/2023 CLINICAL DATA:  Syncope. EXAM: LEFT HAND - COMPLETE 3+ VIEW COMPARISON:  None Available. FINDINGS: An ill-defined, curvilinear cortical lucency of indeterminate age is seen involving the base of the first left metacarpal. This is of indeterminate age. Marked severity chronic and degenerative changes are seen involving the left trapezium bone and first carpometacarpal joint. Additional degenerative changes are noted throughout multiple interphalangeal joints. Soft tissues are unremarkable. IMPRESSION: 1. Findings which may represent a nondisplaced fracture of indeterminate age involving the base of the first left metacarpal. Correlation with physical examination is recommended to determine the presence of point tenderness. 2. Marked severity chronic and degenerative changes involving the left trapezium bone and first carpometacarpal joint. Electronically Signed   By: Suzen Dials M.D.   On: 12/25/2023 22:50     Procedures   Medications Ordered in the ED  acetaminophen  (TYLENOL ) tablet 1,000 mg (has no administration in time range)  Medical Decision Making Amount and/or Complexity of Data Reviewed Labs: ordered. Radiology: ordered. ECG/medicine tests: ordered.  Risk OTC drugs.   Patient presents here syncope versus near syncope and head injury and hand soreness after fall.  Supplemental history provided by EMS.  Patient is on Eliquis , risk factor for bleeding.  He arrives a level 2 trauma.  Expedited CT imaging has been ordered.  The contusion on his face is not requiring suturing.  He has a small hematoma there.  Likewise on the tip of his nose.  No septal hematoma.  He has excellent strength in the lower extremities and no evidence of pelvic fracture.  His EKG appears to show rate controlled a flutter which is chronic for him.  I reviewed his external records.  The patient had an echocardiogram performed September  2025, which showed moderate aortic stenosis, moderate to severe TR, mild MR, EF of 4045%, and akinesis of the distal septum.  Patient's labs returned unrevealing with no emergent findings.  CT imaging was concerning for C6 anterior body fracture.  Will add on viral swab /covid for reported symptoms of weakness     Final diagnoses:  Fall, initial encounter  Closed nondisplaced fracture of sixth cervical vertebra, unspecified fracture morphology, initial encounter (HCC)  Displaced fracture of distal phalanx of left thumb, initial encounter for closed fracture  Contusion of forehead, initial encounter    ED Discharge Orders     None          Cottie Donnice PARAS, MD 12/25/23 2339

## 2023-12-25 NOTE — ED Notes (Signed)
 Patient transported to CT

## 2023-12-25 NOTE — ED Triage Notes (Addendum)
 Patient arrives with [facility] after fall and hitting head; patient was leaving [employer] hall when he collapsed and hit head on tile. EMS was called by bystanders. Patient states he lost consciousness. Patient is on eliquis  for afib/aflutter. Patient has hematoma and laceration in center of forehead above eyebrows; pt reports pain in left hand from trying to catch himself. Dr. Cottie notified. C-collar placed by EMS; patient denies neck pain. SBP was 94 per EMS; given 200 mL NS bolus, BP increased to 136/94. Patient a&o x4. Patient states last Tetanus booster received was last month.  EMS vitals:   70-110 HR afib/aflutter

## 2023-12-25 NOTE — ED Notes (Signed)
Xray tech with portable at bedside.

## 2023-12-25 NOTE — Progress Notes (Signed)
   12/25/23 2228  Spiritual Encounters  Type of Visit Attempt (pt unavailable)  Advance Directives (For Healthcare)  Does Patient Have a Medical Advance Directive? Yes  Type of Estate agent of [place];Living will   Chaplain was paged for trauma level 2. The patient was unavailable. Chaplain remains available if needed.    M.Kubra Susanna Kerry Resident [phone]

## 2023-12-26 ENCOUNTER — Emergency Department (HOSPITAL_COMMUNITY)

## 2023-12-26 DIAGNOSIS — S62235D Other nondisplaced fracture of base of first metacarpal bone, left hand, subsequent encounter for fracture with routine healing: Secondary | ICD-10-CM

## 2023-12-26 DIAGNOSIS — S12501A Unspecified nondisplaced fracture of sixth cervical vertebra, initial encounter for closed fracture: Secondary | ICD-10-CM

## 2023-12-26 DIAGNOSIS — I483 Typical atrial flutter: Secondary | ICD-10-CM | POA: Diagnosis not present

## 2023-12-26 DIAGNOSIS — I25810 Atherosclerosis of coronary artery bypass graft(s) without angina pectoris: Secondary | ICD-10-CM

## 2023-12-26 DIAGNOSIS — R55 Syncope and collapse: Secondary | ICD-10-CM

## 2023-12-26 DIAGNOSIS — E039 Hypothyroidism, unspecified: Secondary | ICD-10-CM | POA: Diagnosis present

## 2023-12-26 DIAGNOSIS — I2510 Atherosclerotic heart disease of native coronary artery without angina pectoris: Secondary | ICD-10-CM

## 2023-12-26 DIAGNOSIS — E785 Hyperlipidemia, unspecified: Secondary | ICD-10-CM

## 2023-12-26 DIAGNOSIS — I480 Paroxysmal atrial fibrillation: Secondary | ICD-10-CM

## 2023-12-26 DIAGNOSIS — I5042 Chronic combined systolic (congestive) and diastolic (congestive) heart failure: Secondary | ICD-10-CM | POA: Diagnosis not present

## 2023-12-26 DIAGNOSIS — S12500A Unspecified displaced fracture of sixth cervical vertebra, initial encounter for closed fracture: Secondary | ICD-10-CM | POA: Diagnosis present

## 2023-12-26 DIAGNOSIS — S12591A Other nondisplaced fracture of sixth cervical vertebra, initial encounter for closed fracture: Secondary | ICD-10-CM | POA: Diagnosis not present

## 2023-12-26 DIAGNOSIS — S62232A Other displaced fracture of base of first metacarpal bone, left hand, initial encounter for closed fracture: Secondary | ICD-10-CM | POA: Diagnosis present

## 2023-12-26 LAB — CBC
HCT: 37.8 % — ABNORMAL LOW (ref 39.0–52.0)
Hemoglobin: 12.4 g/dL — ABNORMAL LOW (ref 13.0–17.0)
MCH: 31.6 pg (ref 26.0–34.0)
MCHC: 32.8 g/dL (ref 30.0–36.0)
MCV: 96.4 fL (ref 80.0–100.0)
Platelets: 254 K/uL (ref 150–400)
RBC: 3.92 MIL/uL — ABNORMAL LOW (ref 4.22–5.81)
RDW: 13.6 % (ref 11.5–15.5)
WBC: 9.1 K/uL (ref 4.0–10.5)
nRBC: 0 % (ref 0.0–0.2)

## 2023-12-26 LAB — BASIC METABOLIC PANEL WITH GFR
Anion gap: 11 (ref 5–15)
BUN: 26 mg/dL — ABNORMAL HIGH (ref 8–23)
CO2: 25 mmol/L (ref 22–32)
Calcium: 8.7 mg/dL — ABNORMAL LOW (ref 8.9–10.3)
Chloride: 102 mmol/L (ref 98–111)
Creatinine, Ser: 1.01 mg/dL (ref 0.61–1.24)
GFR, Estimated: >60 mL/min (ref >60)
Glucose, Bld: 125 mg/dL — ABNORMAL HIGH (ref 70–99)
Potassium: 3.8 mmol/L (ref 3.5–5.1)
Sodium: 138 mmol/L (ref 135–145)

## 2023-12-26 LAB — RESP PANEL BY RT-PCR (RSV, FLU A&B, COVID)  RVPGX2
Influenza A by PCR: NEGATIVE
Influenza B by PCR: NEGATIVE
Resp Syncytial Virus by PCR: NEGATIVE
SARS Coronavirus 2 by RT PCR: NEGATIVE

## 2023-12-26 LAB — TROPONIN I (HIGH SENSITIVITY): Troponin I (High Sensitivity): 13 ng/L (ref <18)

## 2023-12-26 MED ORDER — LACTATED RINGERS IV SOLN: INTRAVENOUS | Status: AC

## 2023-12-26 MED ORDER — AMIODARONE HCL 200 MG PO TABS
100.0000 mg | ORAL_TABLET | Freq: Every day | ORAL | Status: DC
  Administered 2023-12-26 – 2023-12-27 (×2): 100 mg via ORAL
  Filled 2023-12-26 (×2): qty 1

## 2023-12-26 MED ORDER — GABAPENTIN 300 MG PO CAPS
300.0000 mg | ORAL_CAPSULE | Freq: Three times a day (TID) | ORAL | Status: DC
Start: 2023-12-26 — End: 2023-12-27
  Administered 2023-12-26 – 2023-12-27 (×3): 300 mg via ORAL
  Filled 2023-12-26 (×3): qty 1

## 2023-12-26 MED ORDER — FUROSEMIDE 20 MG PO TABS
80.0000 mg | ORAL_TABLET | Freq: Two times a day (BID) | ORAL | Status: DC
Start: 2023-12-26 — End: 2023-12-26

## 2023-12-26 MED ORDER — SODIUM CHLORIDE 0.9% FLUSH
3.0000 mL | Freq: Two times a day (BID) | INTRAVENOUS | Status: DC
  Administered 2023-12-26 – 2023-12-27 (×3): 3 mL via INTRAVENOUS

## 2023-12-26 MED ORDER — HYDROCODONE-ACETAMINOPHEN 5-325 MG PO TABS
1.0000 | ORAL_TABLET | ORAL | Status: DC | PRN
  Administered 2023-12-26 (×3): 1 via ORAL
  Filled 2023-12-26 (×3): qty 1

## 2023-12-26 MED ORDER — SENNOSIDES-DOCUSATE SODIUM 8.6-50 MG PO TABS: 1.0000 | ORAL_TABLET | Freq: Every evening | ORAL | Status: DC | PRN

## 2023-12-26 MED ORDER — APIXABAN 5 MG PO TABS
5.0000 mg | ORAL_TABLET | Freq: Two times a day (BID) | ORAL | Status: DC
  Administered 2023-12-26 – 2023-12-27 (×2): 5 mg via ORAL
  Filled 2023-12-26 (×2): qty 1

## 2023-12-26 MED ORDER — BISACODYL 5 MG PO TBEC: 5.0000 mg | DELAYED_RELEASE_TABLET | Freq: Every day | ORAL | Status: DC | PRN

## 2023-12-26 MED ORDER — ACETAMINOPHEN 325 MG PO TABS
650.0000 mg | ORAL_TABLET | Freq: Four times a day (QID) | ORAL | Status: DC | PRN
  Administered 2023-12-26 – 2023-12-27 (×2): 650 mg via ORAL
  Filled 2023-12-26 (×2): qty 2

## 2023-12-26 MED ORDER — LEVOTHYROXINE SODIUM 75 MCG PO TABS
75.0000 ug | ORAL_TABLET | Freq: Every day | ORAL | Status: DC
  Administered 2023-12-26 – 2023-12-27 (×2): 75 ug via ORAL
  Filled 2023-12-26 (×2): qty 1

## 2023-12-26 MED ORDER — ACETAMINOPHEN 650 MG RE SUPP: 650.0000 mg | Freq: Four times a day (QID) | RECTAL | Status: DC | PRN

## 2023-12-26 MED ORDER — PRAVASTATIN SODIUM 40 MG PO TABS
40.0000 mg | ORAL_TABLET | Freq: Every evening | ORAL | Status: DC
  Administered 2023-12-26: 40 mg via ORAL
  Filled 2023-12-26: qty 1

## 2023-12-26 MED ORDER — PROCHLORPERAZINE EDISYLATE 10 MG/2ML IJ SOLN: 10.0000 mg | Freq: Four times a day (QID) | INTRAMUSCULAR | Status: DC | PRN

## 2023-12-26 NOTE — ED Provider Notes (Signed)
 MRI C-spine is complete.  Discussed with Dr. Nicholes No signs of any cord injury.  There might be ligamentous disruption Patient awaiting admission   Midge Golas, MD 12/26/23 [phone]

## 2023-12-26 NOTE — Progress Notes (Signed)
 Patient arrived to [id number]. VS stable, patient has bil hearing aids, glasses, cell phone and clothes at bedside. Daughter at bedside with patient.

## 2023-12-26 NOTE — H&P (Signed)
 History and Physical    Donald [name] FMW:[id number] DOB: 05-18-1926 DOA: 12/25/2023  PCP: [name], Richard W, MD  Patient coming from: Home  I have personally briefly reviewed patient's old medical records in [facility]  Chief Complaint: Syncope and fall with injury to head  HPI: Donald [name] is a 89 y.o. male with medical history significant for CAD s/p CABG, PAF on Eliquis , HFmrEF, mod-severe TR, hypothyroidism, BPH who presented to the ED for evaluation after a fall and injury to his head.  Patient is fully functional at baseline, ambulates on his own power.  He was leaving the [employer] after concert.  After he was walking for about 10 minutes he suddenly lost consciousness and collapsed to the ground.  He believes he hit both of his hands on the ground as well as his head.  He developed a laceration and hematoma in the middle of his forehead.  EMS were called and patient was placed in a c-collar.  Per ED triage documentation SBP was 94 and he was given 200 mL NS bolus with BP increased to 136/94.  Patient reports pain in both hands, worse when making a fist.  He also notes a feeling of numbness/tingling radiating down both upper extremities.  Strength is otherwise intact.  He reports good movement in his lower extremities.  He denies experiencing any lightheadedness, dizziness, palpitation, chest pain, dyspnea, nausea, vomiting, diaphoresis prior to his syncopal event.  ED Course  Labs/Imaging on admission: I have personally reviewed following labs and imaging studies.  Initial vitals showed BP 129/93, pulse 112, RR 20, temp 97.7 F, SpO2 100% on room air.  Labs showed WBC 8.0, hemoglobin 13.6, platelets 257, sodium 138, potassium 3.7, bicarb 27, BUN 29, creatinine 1.14, serum glucose 109, troponin 12.  CT head and cervical spine without contrast: IMPRESSION: 1. No acute intracranial abnormality. 2. Acute nondisplaced oblique fracture through the anterior  inferior C6 vertebral body. 3. Question nondisplaced bilateral nasal bone fractures. 4. Central forehead hematoma. No calvarial fracture.  Left hand x-ray IMPRESSION: 1. Findings which may represent a nondisplaced fracture of indeterminate age involving the base of the first left metacarpal. Correlation with physical examination is recommended to determine the presence of point tenderness. 2. Marked severity chronic and degenerative changes involving the left trapezium bone and first carpometacarpal joint.  Right hand x-ray negative for acute fracture or dislocation.  Chest x-ray showed mild left basilar atelectasis, prior median sternotomy/CABG.  EDP discussed with neurosurgery who recommended applying Aspen cervical collar.  The hospitalist service was consulted for admission.  Review of Systems: All systems reviewed and are negative except as documented in history of present illness above.   Past Medical History:  Diagnosis Date   Asthma    with worsening with beta blockade   Atrial fibrillation (HCC)    CAD (coronary artery disease)    CVA (cerebral infarction) 2012   tia, mild no residual defecits   GI bleed 8 yrs ago   due to doll fully vessel which was clipped   Glaucoma    excellent cataracts   History of kidney stones    Ischemic cardiomyopathy    Moderate to severe mitral regurgitation 06/16/2017   Nephrolithiasis    Post-infarction apical thrombus [facility])    Prostate cancer [facility]) 2012   Radiation proctitis Feb 2015   treated with APC   Tubular adenoma 04/2013   Dr. Albertus    Past Surgical History:  Procedure Laterality Date  cardiac stents  2003   CARDIOVERSION N/A 06/15/2017   Procedure: CARDIOVERSION;  Surgeon: Alveta, Aleene PARAS, MD;  Location: [facility] ENDOSCOPY;  Service: Cardiovascular;  Laterality: N/A;   CARDIOVERSION N/A 07/15/2017   Procedure: CARDIOVERSION;  Surgeon: Jeffrie Oneil BROCKS, MD;  Location: [facility] ENDOSCOPY;  Service: Cardiovascular;  Laterality: N/A;    CARDIOVERSION N/A 07/20/2017   Procedure: CARDIOVERSION;  Surgeon: Charls Pearla LABOR, MD;  Location: [facility] ENDOSCOPY;  Service: Cardiovascular;  Laterality: N/A;   CARDIOVERSION N/A 11/23/2017   Procedure: CARDIOVERSION;  Surgeon: Pietro Redell RAMAN, MD;  Location: [facility] ENDOSCOPY;  Service: Cardiovascular;  Laterality: N/A;   CARDIOVERSION N/A 06/03/2021   Procedure: CARDIOVERSION;  Surgeon: Kate Lonni CROME, MD;  Location: [facility] ENDOSCOPY;  Service: Cardiovascular;  Laterality: N/A;   CARDIOVERSION N/A 04/03/2022   Procedure: CARDIOVERSION;  Surgeon: Francyne Headland, MD;  Location: [facility] ENDOSCOPY;  Service: Cardiovascular;  Laterality: N/A;   COLONOSCOPY WITH PROPOFOL  N/A 05/05/2013   Procedure: COLONOSCOPY WITH PROPOFOL ;  Surgeon: Gordy CHRISTELLA Starch, MD;  Location: [facility] ENDOSCOPY;  Service: Gastroenterology;  Laterality: N/A;   CORONARY ARTERY BYPASS GRAFT  1998   LIMA to the LAD and saphenous vein graft tothe diagonal   EXTRACORPOREAL SHOCK WAVE LITHOTRIPSY Right 04/29/2017   Procedure: RIGHT EXTRACORPOREAL SHOCK WAVE LITHOTRIPSY (ESWL);  Surgeon: Alvaro Hummer, MD;  Location: [facility] ORS;  Service: Urology;  Laterality: Right;   history of radiation treatment  2012   x 40 treatments done   KNEE ARTHROSCOPY  2016 or 2017   Dr Heide ;    PARTIAL KNEE ARTHROPLASTY Right 01/13/2017   Procedure: UNICOMPARTMENTAL RIGHT KNEE;  Surgeon: Melodi Lerner, MD;  Location: [facility] ORS;  Service: Orthopedics;  Laterality: Right;  with block    Social History: Social History   Tobacco Use   Smoking status: Never   Smokeless tobacco: Never  Vaping Use   Vaping status: Never Used  Substance Use Topics   Alcohol  use: Yes    Alcohol /week: 0.0 standard drinks of alcohol     Comment: occasional beer or wine   Drug use: No    Allergies  Allergen Reactions   Beta Adrenergic Blockers Other (See Comments)    Other reaction(s): asthma exacerbation   Keflex [Cephalexin] Nausea And Vomiting and Other (See Comments)     'Killed all GI enzymes (also) and patient prefers to not take this   Morphine Nausea Only    Family History  Problem Relation Age of Onset   Heart attack Father    Heart disease Mother    Stomach cancer Mother      Prior to Admission medications   Medication Sig Start Date End Date Taking? Authorizing Provider  alendronate (FOSAMAX) 70 MG tablet Take 70 mg by mouth every Sunday. Take with a full glass of water  on an empty stomach.    [provider]  amiodarone  (PACERONE ) 100 MG tablet Take 1 tablet (100 mg total) by mouth daily. 06/22/23   Pietro Redell RAMAN, MD  Cholecalciferol  (VITAMIN D ) 2000 UNITS tablet Take 2,000 Units by mouth daily.    [provider]  ELIQUIS  5 MG TABS tablet TAKE 1 TABLET BY MOUTH TWICE  DAILY 09/03/23   Pietro Redell RAMAN, MD  furosemide  (LASIX ) 40 MG tablet TAKE 2 TABLETS BY MOUTH TWICE  DAILY 12/24/23   Pietro Redell RAMAN, MD  levothyroxine  (SYNTHROID ) 25 MCG tablet TAKE 1 TABLET DAILY BEFORE BREAKFAST 03/25/21   Burnard Debby LABOR, MD  loratadine  (CLARITIN ) 10 MG tablet Take 10 mg by mouth  daily.    [provider]  Multiple Vitamin (MULTIVITAMIN WITH MINERALS) TABS tablet Take 1 tablet by mouth daily.    [provider]  Multiple Vitamins-Minerals (ICAPS AREDS 2 PO) Take 1 tablet by mouth daily.    [provider]  pravastatin  (PRAVACHOL ) 40 MG tablet TAKE 1 TABLET EVERY EVENING 03/25/21   Burnard Debby LABOR, MD  tamsulosin  (FLOMAX ) 0.4 MG CAPS capsule Take 0.4 mg by mouth every evening.    [provider]    Physical Exam: Vitals:   12/25/23 2230 12/25/23 2245 12/25/23 2330 12/25/23 2345  BP: 111/80 110/65 (!) 115/95 105/75  Pulse: (!) 56 (!) 46 65 (!) 109  Resp: 11 (!) 30 15 20   Temp:      TempSrc:      SpO2: 100% 98% 98% 98%  Weight:      Height:       Constitutional: Elderly man resting in bed with head elevated.  Eyes: EOMI, lids and conjunctivae normal ENMT: Hematoma and laceration of forehead  with bleeding from forehead as well as nose which appears slightly misshapened Neck: C-spine collar in place Respiratory: clear to auscultation anteriorly. Normal respiratory effort. No accessory muscle use.  Cardiovascular: Irregularly irregular. No extremity edema. 2+ pedal pulses. Abdomen: no tenderness, no masses palpated. Musculoskeletal: C-spine collar in place, left thumb splint in place.  ROM of extremities intact. Skin: Hematoma and laceration of forehead, bleeding from forehead laceration and nose Neurologic: Sensation intact. Strength 5/5 in all 4.  Patient reports tingling sensation down both upper extremities Psychiatric: Normal judgment and insight. Alert and oriented x 3. Normal mood.   EKG: Personally reviewed.  Atrial fibrillation rate 57, RBBB.  Assessment/Plan Principal Problem:   C6 cervical fracture (HCC) Active Problems:   Paroxysmal atrial fibrillation (HCC)   Chronic heart failure with mildly reduced ejection fraction (HFmrEF, 41-49%) (HCC)   Coronary artery disease/ history of CABG   Hypothyroidism   Fracture of metacarpal base, first, left hand, closed   CORRION STIREWALT is a 89 y.o. male with medical history significant for CAD s/p CABG, PAF on Eliquis , HFmrEF, mod-severe TR, hypothyroidism, BPH who is admitted with an acute traumatic C6 vertebral body fracture occurring after syncopal event.  Assessment and Plan: Acute oblique nondisplaced fracture of anterior inferior C6 vertebral body: Traumatic injury occurring after an apparent syncopal event and fall.  EDP spoke with neurosurgery team who recommended keeping patient in C-spine collar.  Patient is reporting numbness/tingling sensation rating down both upper extremities. - Follow-up MRI C-spine is ordered and pending - Will need to reengage neurosurgery if MRI concerning for central cord syndrome - Continue neuro checks  Syncope with collapse: Patient with syncopal episode and fall without prodrome.  He  reports a history of orthostatic hypotension.  Recent TTE 12/06/2023 showed EF 40-45%, mild mitral valve regurgitation, moderate to severe tricuspid valve regurgitation.  Noted to be in A-fib with variable rate. - Keep on telemetry - Obtain orthostatic vitals - Gentle IV fluid hydration tonight  Chronic HFmrEF: Appears euvolemic on admission.  Holding home Lasix  for now.  GDMT has been limited due to history of orthostasis.  Monitor I/O's.  CAD s/p CABG: Patient denies chest pain.  Initial troponin negative.  EKG without significant ST changes.  Continue statin, holding Eliquis  as above.  Paroxysmal atrial fibrillation: Appears to be in atrial fibrillation with variable rate.  Continue amiodarone .  Holding Eliquis  for now given active bleeding from forehead laceration.  Has not been on  beta-blocker due to history of orthostasis.  Hypothyroidism: Continue Synthroid .  BPH: Holding Flomax  for now.  Nondisplaced fracture base of first left metacarpal: Noted on x-ray.  Thumb splint in place.  ?  Nondisplaced bilateral nasal bone fractures: Noted on CT imaging.  Can follow-up with ENT as an outpatient.   DVT prophylaxis: SCDs Start: 12/26/23 0106 Code Status:   Code Status: Do not attempt resuscitation (DNR) PRE-ARREST INTERVENTIONS DESIRED confirmed with patient on admission. Family Communication: Granddaughter at bedside. Disposition Plan: Pending clinical progress Consults called: EDP discussed with neurosurgery Severity of Illness: The appropriate patient status for this patient is OBSERVATION. Observation status is judged to be reasonable and necessary in order to provide the required intensity of service to ensure the patient's safety. The patient's presenting symptoms, physical exam findings, and initial radiographic and laboratory data in the context of their medical condition is felt to place them at decreased risk for further clinical deterioration. Furthermore, it is anticipated  that the patient will be medically stable for discharge from the hospital within 2 midnights of admission.   Donald Blanch MD [facility]  If 7PM-7AM, please contact night-coverage [email or web address]  12/26/2023, 1:25 AM

## 2023-12-26 NOTE — Consult Note (Addendum)
 Cardiology Consultation   Patient ID: Donald [name] MRN: [id number]; DOB: 11-Nov-1926  Admit date: 12/25/2023 Date of Consult: 12/26/2023  PCP:  Vernadine Charlie ORN, MD   [facility] Providers Cardiologist:  Redell Shallow, MD      Patient Profile: Donald [name] is a 89 y.o. male with a hx of paroxysmal atrial fibrillation, coronary artery disease, ischemic cardiomyopathy, history of prostate cancer, prior CVA who is being seen 12/26/2023 for the evaluation of atrial fibrillation at the request of Sabas Brod, MD.  History of Present Illness: He is status post coronary bypassing graft in 1990. Patient also with hypotension/orthostasis. Amiodarone  decreased to 100 mg daily October 2019 as patient complained of dizziness, tremors, and constipation. Had cardioversion of atrial flutter January 2024.  Echocardiogram July 2025 showed ejection fraction 40 to 45%, apical swirling but no thrombus noted, severe left atrial enlargement, mild right ventricular enlargement, mild mitral regurgitation, probable mild aortic stenosis, trace aortic insufficiency, moderate to severe tricuspid regurgitation.    Patient typically does not have dyspnea on exertion, orthopnea, PND, pedal edema, chest pain.  No palpitations.  He was at a concert at [facility] yesterday.  He left in approximately 5 minutes after walking through the hall he had syncope.  No preceding chest pain, nausea, dyspnea or palpitations.  No incontinence.  He had trauma to his head and neck.  Also found to be in recurrent atrial flutter.  Cardiology now asked to evaluate.   Past Medical History:  Diagnosis Date   Asthma    with worsening with beta blockade   Atrial fibrillation (HCC)    CAD (coronary artery disease)    CVA (cerebral infarction) 2012   tia, mild no residual defecits   GI bleed 8 yrs ago   due to doll fully vessel which was clipped   Glaucoma    excellent cataracts   History of kidney stones    Ischemic  cardiomyopathy    Moderate to severe mitral regurgitation 06/16/2017   Nephrolithiasis    Post-infarction apical thrombus [facility])    Prostate cancer [facility]) 2012   Radiation proctitis Feb 2015   treated with APC   Tubular adenoma 04/2013   Dr. Albertus    Past Surgical History:  Procedure Laterality Date   cardiac stents  2003   CARDIOVERSION N/A 06/15/2017   Procedure: CARDIOVERSION;  Surgeon: Alveta Aleene PARAS, MD;  Location: [facility] ENDOSCOPY;  Service: Cardiovascular;  Laterality: N/A;   CARDIOVERSION N/A 07/15/2017   Procedure: CARDIOVERSION;  Surgeon: Jeffrie Oneil BROCKS, MD;  Location: [facility] ENDOSCOPY;  Service: Cardiovascular;  Laterality: N/A;   CARDIOVERSION N/A 07/20/2017   Procedure: CARDIOVERSION;  Surgeon: Charls Pearla LABOR, MD;  Location: [facility] ENDOSCOPY;  Service: Cardiovascular;  Laterality: N/A;   CARDIOVERSION N/A 11/23/2017   Procedure: CARDIOVERSION;  Surgeon: Shallow Redell RAMAN, MD;  Location: [facility] ENDOSCOPY;  Service: Cardiovascular;  Laterality: N/A;   CARDIOVERSION N/A 06/03/2021   Procedure: CARDIOVERSION;  Surgeon: Kate Lonni CROME, MD;  Location: [facility] ENDOSCOPY;  Service: Cardiovascular;  Laterality: N/A;   CARDIOVERSION N/A 04/03/2022   Procedure: CARDIOVERSION;  Surgeon: Francyne Headland, MD;  Location: [facility] ENDOSCOPY;  Service: Cardiovascular;  Laterality: N/A;   COLONOSCOPY WITH PROPOFOL  N/A 05/05/2013   Procedure: COLONOSCOPY WITH PROPOFOL ;  Surgeon: Gordy CHRISTELLA Albertus, MD;  Location: [facility] ENDOSCOPY;  Service: Gastroenterology;  Laterality: N/A;   CORONARY ARTERY BYPASS GRAFT  1998   LIMA to the LAD and saphenous vein graft tothe diagonal   EXTRACORPOREAL SHOCK WAVE LITHOTRIPSY Right 04/29/2017  Procedure: RIGHT EXTRACORPOREAL SHOCK WAVE LITHOTRIPSY (ESWL);  Surgeon: Alvaro Hummer, MD;  Location: [facility] ORS;  Service: Urology;  Laterality: Right;   history of radiation treatment  2012   x 40 treatments done   KNEE ARTHROSCOPY  2016 or 2017   Dr Heide ;    PARTIAL KNEE ARTHROPLASTY Right  01/13/2017   Procedure: UNICOMPARTMENTAL RIGHT KNEE;  Surgeon: Melodi Lerner, MD;  Location: [facility] ORS;  Service: Orthopedics;  Laterality: Right;  with block     Scheduled Meds:  amiodarone   100 mg Oral Daily   apixaban   5 mg Oral BID   levothyroxine   75 mcg Oral Q0600   pravastatin   40 mg Oral QPM   sodium chloride  flush  3 mL Intravenous Q12H   Continuous Infusions:  PRN Meds: acetaminophen  **OR** acetaminophen , bisacodyl , HYDROcodone -acetaminophen , prochlorperazine, senna-docusate  Allergies:    Allergies  Allergen Reactions   Beta Adrenergic Blockers Other (See Comments)    Other reaction(s): asthma exacerbation   Keflex [Cephalexin] Nausea And Vomiting and Other (See Comments)    'Killed all GI enzymes (also) and patient prefers to not take this   Morphine Nausea Only    Social History:   Social History   Socioeconomic History   Marital status: Widowed    Spouse name: Not on file   Number of children: 3   Years of education: Not on file   Highest education level: Not on file  Occupational History   Occupation: Retired Equities trader  Tobacco Use   Smoking status: Never   Smokeless tobacco: Never  Vaping Use   Vaping status: Never Used  Substance and Sexual Activity   Alcohol  use: Yes    Alcohol /week: 0.0 standard drinks of alcohol     Comment: occasional beer or wine   Drug use: No   Sexual activity: Not Currently    Birth control/protection: Abstinence  Other Topics Concern   Not on file  Social History Narrative   Not on file   Social Drivers of Health   Financial Resource Strain: Not on file  Food Insecurity: Not on file  Transportation Needs: Not on file  Physical Activity: Not on file  Stress: Not on file  Social Connections: Not on file  Intimate Partner Violence: Not on file    Family History:    Family History  Problem Relation Age of Onset   Heart attack Father    Heart disease Mother    Stomach cancer Mother      ROS:  Please  see the history of present illness.  No fevers, chills, productive cough or hemoptysis. All other ROS reviewed and negative.     Physical Exam/Data: Vitals:   12/26/23 0726 12/26/23 0800 12/26/23 0816 12/26/23 0904  BP:  97/64  122/73  Pulse:  (!) 46 (!) 51 (!) 110  Resp:  12 16 17   Temp:    97.6 F (36.4 C)  TempSrc:    Oral  SpO2: 97% 96% 95% 97%  Weight:      Height:        Intake/Output Summary (Last 24 hours) at 12/26/2023 0936 Last data filed at 12/26/2023 0252 Gross per 24 hour  Intake --  Output 300 ml  Net -300 ml      12/25/2023   10:27 PM 10/27/2023    8:39 AM 02/04/2023    9:24 AM  Last 3 Weights  Weight (lbs) 150 lb 161 lb 9.6 oz 164 lb 9.6 oz  Weight (kg) 68.04 kg 73.301 kg  74.662 kg     Body mass index is 22.15 kg/m.  General:  Well nourished, well developed, in no acute distress HEENT: Abrasions noted over face and nose Neck: no JVD Vascular: No carotid bruits; Distal pulses 2+ bilaterally Cardiac:  normal S1, S2; irregular Lungs:  clear to auscultation bilaterally, no wheezing, rhonchi or rales  Abd: soft, nontender, no hepatomegaly  Ext: no edema Musculoskeletal: Left wrist in brace Skin: warm and dry  Neuro:  CNs 2-12 intact, no focal abnormalities noted Psych:  Normal affect   EKG:  The EKG was personally reviewed and demonstrates: Atrial flutter, inferior infarct, right bundle branch block. Telemetry:  Telemetry was personally reviewed and demonstrates: Atrial flutter with controlled ventricular response  Laboratory Data: High Sensitivity Troponin:   Recent Labs  Lab 12/25/23 2220 12/26/23 0032  TROPONINIHS 12 13     Chemistry Recent Labs  Lab 12/25/23 2220 12/25/23 2222 12/26/23 0514  NA 138 139 138  K 3.7 3.7 3.8  CL 100 99 102  CO2 27  --  25  GLUCOSE 109* 110* 125*  BUN 29* 30* 26*  CREATININE 1.14 1.30* 1.01  CALCIUM 9.2  --  8.7*  GFRNONAA 59*  --  >60  ANIONGAP 11  --  11    Hematology Recent Labs  Lab  12/25/23 2220 12/25/23 2222 12/26/23 0514  WBC 8.0  --  9.1  RBC 4.23  --  3.92*  HGB 13.6 14.6 12.4*  HCT 41.4 43.0 37.8*  MCV 97.9  --  96.4  MCH 32.2  --  31.6  MCHC 32.9  --  32.8  RDW 13.6  --  13.6  PLT 257  --  254    Radiology/Studies:  MR Cervical Spine Wo Contrast Result Date: 12/26/2023 CLINICAL DATA:  Initial evaluation for acute neck trauma. EXAM: MRI CERVICAL SPINE WITHOUT CONTRAST TECHNIQUE: Multiplanar, multisequence MR imaging of the cervical spine was performed. No intravenous contrast was administered. COMPARISON:  CT from 12/25/2023. FINDINGS: Alignment: Reversal of the normal cervical lordosis, apex at C5-6. 2 mm degenerative anterolisthesis of C7 on T1. Vertebrae: Acute nondisplaced fracture involving the anterior aspect of the inferior endplate of C6 again seen, stable. Vertebral body height maintained. No other acute fracture elsewhere within the cervical spine. Underlying bone marrow signal intensity within normal limits. No worrisome osseous lesions. No other abnormal marrow edema. Cord: Normal signal and morphology. No evidence for traumatic cord injury. Posterior Fossa, vertebral arteries, paraspinal tissues: Visualized brain and posterior fossa within normal limits. Craniocervical junction normal. Prevertebral edema seen at C6-7 through T1-2, consistent with the acute trauma. There is presumed injury if not frank destruction of the anterior longitudinal ligament at the level of the C6 fracture. No other evidence for ligamentous injury. Normal flow voids seen within the vertebral arteries bilaterally. Disc levels: C2-C3: Small central disc protrusion mildly indents the ventral thecal sac. Mild disc bulge. Chronic endplate Schmorl's node deformity at the inferior endplate of C2. Mild facet spurring. No spinal stenosis. Foramina remain patent. C3-C4: Degenerate intervertebral disc space narrowing with diffuse disc osteophyte complex. Flattening and effacement of the ventral  thecal sac. Superimposed left greater than right mild facet and ligament flavum hypertrophy. Resultant severe spinal stenosis with the thecal sac measuring 6 mm in AP diameter. Secondary cord flattening but no visible cord signal changes. Moderate left C4 foraminal stenosis. Right neural foramina remains patent. C4-C5: Degenerative disc spacing with diffuse disc osteophyte complex. Flattening and partial effacement of the ventral thecal sac. Mild  spinal stenosis. Mild right C5 foraminal narrowing. Left neural foramen remains patent. C5-C6: Degenerate intervertebral disc space narrowing with circumferential disc osteophyte complex. Posterior component effaces the ventral thecal sac. Secondary cord flattening without cord signal changes. Severe spinal stenosis with the thecal sac measuring 6 mm in AP diameter. Severe bilateral C6 foraminal stenosis. C6-C7: Degenerative intervertebral disc space narrowing with diffuse disc osteophyte complex. Flattening of the ventral thecal sac with resultant mild spinal stenosis. Severe left with moderate to severe right C7 foraminal narrowing. C7-T1: Anterolisthesis. Disc desiccation without significant disc bulge. Mild-to-moderate bilateral facet hypertrophy. No spinal stenosis. Foramina remain patent. IMPRESSION: 1. Acute nondisplaced fracture involving the anterior aspect of the inferior endplate of C6, stable. Associated prevertebral edema at C6-7 through T1-2. Presumed injury if not frank disruption of the anterior longitudinal ligament at the level of the C6 fracture. No other evidence for ligamentous injury. 2. No other acute traumatic injury within the cervical spine or spinal cord. 3. Advanced multilevel cervical spondylosis with resultant severe spinal stenosis at C3-4 and C5-6. Secondary cord flattening at these levels without cord signal changes. 4. Multifactorial degenerative changes with resultant multilevel foraminal narrowing as above. Notable findings include  moderate left C4 foraminal stenosis, severe bilateral C6 foraminal narrowing, with severe left and moderate to severe right C7 foraminal stenosis. Electronically Signed   By: Morene Hoard M.D.   On: 12/26/2023 02:08   CT Head Wo Contrast Result Date: 12/25/2023 EXAM: CT HEAD AND CERVICAL SPINE 12/25/2023 10:42:32 PM TECHNIQUE: CT of the head and cervical spine was performed without the administration of intravenous contrast. Multiplanar reformatted images are provided for review. Automated exposure control, iterative reconstruction, and/or weight based adjustment of the mA/kV was utilized to reduce the radiation dose to as low as reasonably achievable. COMPARISON: CT head made 12:29 and CT cervical spine 07/24/2017. CLINICAL HISTORY: Polytrauma, blunt. Syncope, fall and hit head, on thinners, hematoma and laceration to forehead. FINDINGS: CT HEAD BRAIN AND VENTRICLES: No acute intracranial hemorrhage. No mass effect or midline shift. No abnormal extra-axial fluid collection. 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 No hydrocephalus. ORBITS: No acute abnormality. SINUSES AND MASTOIDS: Mucosal thickening and frothy debris in the ethmoid air cells and maxillary and sphenoid sinuses. SOFT TISSUES AND SKULL: Central forehead hematoma. No calvarial fracture. Question nondisplaced bilateral nasal bone fractures. CT CERVICAL SPINE BONES AND ALIGNMENT: Acute nondisplaced fracture through the anterior inferior C6 vertebral body. No traumatic malalignment. DEGENERATIVE CHANGES: Multilevel spondylosis, disc space height loss, and degenerative endplate changes greatest at C5-C6 and C6-C7 where they are advanced. Posterior disc osteophyte complex at C5-C6 causes mild-to-moderate effacement of the ventral thecal sac. SOFT TISSUES: No prevertebral soft tissue swelling. IMPRESSION: 1. No acute intracranial abnormality. 2. Acute nondisplaced oblique fracture through the anterior inferior C6 vertebral body. 3.  Question nondisplaced bilateral nasal bone fractures. 4. Central forehead hematoma. No calvarial fracture. Electronically signed by: Norman Gatlin MD 12/25/2023 11:06 PM EDT RP Workstation: [id number]   CT Cervical Spine Wo Contrast Result Date: 12/25/2023 EXAM: CT HEAD AND CERVICAL SPINE 12/25/2023 10:42:32 PM TECHNIQUE: CT of the head and cervical spine was performed without the administration of intravenous contrast. Multiplanar reformatted images are provided for review. Automated exposure control, iterative reconstruction, and/or weight based adjustment of the mA/kV was utilized to reduce the radiation dose to as low as reasonably achievable. COMPARISON: CT head made 12:29 and CT cervical spine 07/24/2017. CLINICAL HISTORY: Polytrauma, blunt. Syncope, fall and hit head, on thinners, hematoma and laceration to forehead.  FINDINGS: CT HEAD BRAIN AND VENTRICLES: No acute intracranial hemorrhage. No mass effect or midline shift. No abnormal extra-axial fluid collection. 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 No hydrocephalus. ORBITS: No acute abnormality. SINUSES AND MASTOIDS: Mucosal thickening and frothy debris in the ethmoid air cells and maxillary and sphenoid sinuses. SOFT TISSUES AND SKULL: Central forehead hematoma. No calvarial fracture. Question nondisplaced bilateral nasal bone fractures. CT CERVICAL SPINE BONES AND ALIGNMENT: Acute nondisplaced fracture through the anterior inferior C6 vertebral body. No traumatic malalignment. DEGENERATIVE CHANGES: Multilevel spondylosis, disc space height loss, and degenerative endplate changes greatest at C5-C6 and C6-C7 where they are advanced. Posterior disc osteophyte complex at C5-C6 causes mild-to-moderate effacement of the ventral thecal sac. SOFT TISSUES: No prevertebral soft tissue swelling. IMPRESSION: 1. No acute intracranial abnormality. 2. Acute nondisplaced oblique fracture through the anterior inferior C6 vertebral body. 3. Question  nondisplaced bilateral nasal bone fractures. 4. Central forehead hematoma. No calvarial fracture. Electronically signed by: Norman Gatlin MD 12/25/2023 11:06 PM EDT RP Workstation: [id number]   DG Chest Portable 1 View Result Date: 12/25/2023 CLINICAL DATA:  Syncope. EXAM: PORTABLE CHEST 1 VIEW COMPARISON:  October 27, 2023 FINDINGS: Multiple sternal wires and vascular clips are seen. The heart size and mediastinal contours are within normal limits. There is marked severity calcification of the aortic arch. Mild atelectasis is seen within the left lung base. No pleural effusion or pneumothorax is identified. Multilevel degenerative changes are present throughout the thoracic spine. IMPRESSION: 1. Evidence of prior median sternotomy/CABG. 2. Mild left basilar atelectasis. Electronically Signed   By: Suzen Dials M.D.   On: 12/25/2023 22:56   DG Hand Complete Right Result Date: 12/25/2023 CLINICAL DATA:  Syncope. EXAM: RIGHT HAND - COMPLETE 3+ VIEW COMPARISON:  None Available. FINDINGS: There is no evidence of an acute fracture or dislocation. Mild chronic changes are seen involving the proximal portions of the fourth and fifth right metacarpals. Marked severity degenerative changes are present involving the first carpometacarpal and adjacent portions of the right triquetrum bone and base of the first right metacarpal. Additional degenerative changes are seen involving numerous interphalangeal joints. Soft tissues are unremarkable. IMPRESSION: 1. No acute fracture or dislocation. 2. Marked severity degenerative changes, as described above. Electronically Signed   By: Suzen Dials M.D.   On: 12/25/2023 22:54   DG Hand Complete Left Result Date: 12/25/2023 CLINICAL DATA:  Syncope. EXAM: LEFT HAND - COMPLETE 3+ VIEW COMPARISON:  None Available. FINDINGS: An ill-defined, curvilinear cortical lucency of indeterminate age is seen involving the base of the first left metacarpal. This is of indeterminate age.  Marked severity chronic and degenerative changes are seen involving the left trapezium bone and first carpometacarpal joint. Additional degenerative changes are noted throughout multiple interphalangeal joints. Soft tissues are unremarkable. IMPRESSION: 1. Findings which may represent a nondisplaced fracture of indeterminate age involving the base of the first left metacarpal. Correlation with physical examination is recommended to determine the presence of point tenderness. 2. Marked severity chronic and degenerative changes involving the left trapezium bone and first carpometacarpal joint. Electronically Signed   By: Suzen Dials M.D.   On: 12/25/2023 22:50     Assessment and Plan: Syncope-etiology unclear.  Patient does have a history of orthostasis but description yesterday seems less consistent with this.  Recent echocardiogram showed ejection fraction 40 to 45%.  I have reviewed his telemetry over the preceding 12 hours and he remains in atrial flutter but no prolonged pauses.  Question if he is having  intermittent atrial flutter with posttermination pauses.  Continue to follow on telemetry.  Outpatient monitor if no significant pauses noted here. Atrial flutter-patient is having recurrent atrial flutter.  This is new compared to July 2025.  His heart rate is controlled.  Will continue amiodarone  at present dose.  Avoid beta-blocker or calcium channel blocker due to history of orthostasis/low blood pressure.  Long discussion today concerning risk/benefit of anticoagulation.  CHA2DS2-VASc is 6.  However he has fallen twice recently including the syncopal episode.  I am now concerned risk of anticoagulation outweighs benefit.  Would therefore discontinue at discharge.  He understands the higher risk of CVA off of anticoagulation.  We also discussed possible Watchman device.  Not clear that he would be a candidate given his age but he is very functional.  However he would prefer conservative measures  and would like to avoid procedures. Coronary artery disease-patient denies chest pain.  Since anticoagulation will be discontinued would add aspirin  81 mg daily at discharge.  Continue statin. Chronic combined systolic/diastolic congestive heart failure-he appears to be euvolemic on examination.  Would resume Lasix  40 mg daily at discharge.  He can take an additional 40 mg daily as needed.  Will avoid ARB/Entresto or beta-blocker given history of orthostasis/borderline blood pressure. Hyperlipidemia-continue statin. Previous apical thrombus-not evident on most recent echocardiogram. Valvular heart disease-possible mild aortic stenosis and moderate to severe tricuspid regurgitation on recent echocardiogram.  Conservative measures given patient's age. Nondisplaced fracture of anterior inferior C6 vertebral body-per primary service.   For questions or updates, please contact [facility] Please consult [email or web address] for contact info under    Signed, Redell Shallow, MD  12/26/2023 9:36 AM

## 2023-12-26 NOTE — Hospital Course (Signed)
 Donald [name] is a 89 y.o. male with medical history significant for CAD s/p CABG, PAF on Eliquis , HFmrEF, mod-severe TR, hypothyroidism, BPH who is admitted with an acute traumatic C6 vertebral body fracture occurring after syncopal event.

## 2023-12-26 NOTE — Progress Notes (Signed)
 CT cervical spine reviewed which shows an anterior fracture of the osteophyte and a little into the body at C6. MRI shows now spinal cord compression. No surgical intervention is warranted. Recommend collar and can follow up as an outpatient.

## 2023-12-26 NOTE — Progress Notes (Signed)
 [facility]  PROGRESS NOTE  Donald [name] FMW:[id number] DOB: September 04, 1926 DOA: 12/25/2023 PCP: [name], Richard W, MD   Brief HPI:   89 y.o. male with medical history significant for CAD s/p CABG, PAF on Eliquis , HFmrEF, mod-severe TR, hypothyroidism, BPH who presented to the ED for evaluation after a fall and injury to his head.  CT head and cervical spine without contrast: IMPRESSION: 1. No acute intracranial abnormality. 2. Acute nondisplaced oblique fracture through the anterior inferior C6 vertebral body. 3. Question nondisplaced bilateral nasal bone fractures. 4. Central forehead hematoma. No calvarial fracture Left hand x-ray IMPRESSION: 1. Findings which may represent a nondisplaced fracture of indeterminate age involving the base of the first left metacarpal.  Assessment/Plan:   Acute oblique nondisplaced fracture of anterior inferior C6 vertebral body: Traumatic injury occurring after an apparent syncopal event and fall.  EDP spoke with neurosurgery team who recommended keeping patient in C-spine collar.  Patient is reporting numbness/tingling sensation rating down both upper extremities. -MRI C-spine obtained which was negative for spinal cord compression -Neurosurgery recommends to continue with collar and follow-up as outpatient - Will start gabapentin 300 mg p.o. 3 times daily - PT OT eval  Syncope with collapse: Patient with syncopal episode and fall without prodrome.  He reports a history of orthostatic hypotension.  Recent TTE 12/06/2023 showed EF 40-45%, mild mitral valve regurgitation, moderate to severe tricuspid valve regurgitation.  Noted to be in A-fib with variable rate. - Continue monitoring on telemetry - Recently had echocardiogram which showed EF of 40 to 45% -Cardiology following, likely will need event monitor as outpatient   Chronic HFmrEF: Appears euvolemic on admission.   Holding home Lasix  for now.   GDMT has been limited due to history of  orthostasis.     CAD s/p CABG: Patient denies chest pain.  Initial troponin negative.  EKG without significant ST changes.  Continue statin, holding Eliquis  as above.   Paroxysmal atrial fibrillation: Appears to be in atrial fibrillation with variable rate.  Continue amiodarone .  Holding Eliquis  for now given active bleeding from forehead laceration.  Has not been on beta-blocker due to history of orthostasis.  - Patient takes Eliquis  at home - He is also on amiodarone  -Amiodarone  was continued and Eliquis  was held -Cardiology consulted, recommend to discontinue Eliquis  - Continue monitoring on telemetry  Acute kidney injury - Came with BUN/creatinine of 30/1.30 - Improved to BUNs/creatinine of 26/1.01  Hypothyroidism: Continue Synthroid .   BPH: Holding Flomax  for now due to soft blood pressure   Nondisplaced fracture base of first left metacarpal: Noted on x-ray.  Thumb splint in place.   ?  Nondisplaced bilateral nasal bone fractures: Noted on CT imaging.  Can follow-up with ENT as an outpatient.    Medications     amiodarone   100 mg Oral Daily   levothyroxine   75 mcg Oral Q0600   pravastatin   40 mg Oral QPM   sodium chloride  flush  3 mL Intravenous Q12H     Data Reviewed:   CBG:  No results for input(s): GLUCAP in the last 168 hours.  SpO2: 97 %    Vitals:   12/26/23 0415 12/26/23 0420 12/26/23 0600 12/26/23 0726  BP:   94/67   Pulse: (!) 55 (!) 55 (!) 53   Resp:   13   Temp:  97.7 F (36.5 C)    TempSrc:  Oral    SpO2: 97%  97% 97%  Weight:      Height:  Data Reviewed:  Basic Metabolic Panel: Recent Labs  Lab 12/25/23 2220 12/25/23 2222 12/26/23 0514  NA 138 139 138  K 3.7 3.7 3.8  CL 100 99 102  CO2 27  --  25  GLUCOSE 109* 110* 125*  BUN 29* 30* 26*  CREATININE 1.14 1.30* 1.01  CALCIUM 9.2  --  8.7*    CBC: Recent Labs  Lab 12/25/23 2220 12/25/23 2222 12/26/23 0514  WBC 8.0  --  9.1  HGB 13.6 14.6 12.4*  HCT  41.4 43.0 37.8*  MCV 97.9  --  96.4  PLT 257  --  254    LFT No results for input(s): AST, ALT, ALKPHOS, BILITOT, PROT, ALBUMIN in the last 168 hours.   Antibiotics: Anti-infectives (From admission, onward)    None        DVT prophylaxis: SCDs  Code Status: DNR  Family Communication: Discussed with patient's daughter at bedside   CONSULTS cardiology   Subjective   Complains of numbness in both upper extremities   Objective    Physical Examination:   General: Appears in no acute distress Cardiovascular: S1-S2, regular, no murmur auscultated Respiratory: Lungs are clear to auscultation bilaterally Abdomen: Abdomen is soft, nontender, no organomegaly Extremities: No edema in the lower extremities Neurologic: Alert, oriented x 3, no focal deficit noted, intact insight and judgment   Status is: Inpatient:             Donald [name]   [facility] If 7PM-7AM, please contact night-coverage at [email or web address], Office  [phone]   12/26/2023, 7:37 AM  LOS: 0 days

## 2023-12-26 NOTE — Progress Notes (Signed)
 Orthopedic Tech Progress Note Patient Details:  KENTON FORTIN 02/14/1927 [id number]  Ortho Devices Type of Ortho Device: Thumb velcro splint Ortho Device/Splint Location: lue Ortho Device/Splint Interventions: Ordered, Application, Adjustment  Thumb velcro brace applied at drs request. Post Interventions Patient Tolerated: Well Instructions Provided: Care of device, Adjustment of device  Chandra Dorn PARAS 12/26/2023, 12:46 AM

## 2023-12-26 NOTE — Progress Notes (Signed)
 Transition of Care [facility]) - CAGE-AID Screening   Patient Details  Name: KOTA CIANCIO MRN: [id number] Date of Birth: Mar 03, 1927   Darice CHRISTELLA Rouleau, RN Trauma Response Nurse Phone Number: [phone] 12/26/2023, 5:35 PM    CAGE-AID Screening:    Have You Ever Felt You Ought to Cut Down on Your Drinking or Drug Use?: No Have People Annoyed You By Critizing Your Drinking Or Drug Use?: No Have You Felt Bad Or Guilty About Your Drinking Or Drug Use?: No Have You Ever Had a Drink or Used Drugs First Thing In The Morning to Steady Your Nerves or to Get Rid of a Hangover?: No CAGE-AID Score: 0  Substance Abuse Education Offered: (S) No (No services needed, pt does not drink alcohol )

## 2023-12-26 NOTE — ED Provider Notes (Signed)
 I assumed care at signout to follow-up with neurosurgery recommendations I spoke to [facility] with neurosurgery.  She recommends keeping patient and C-spine collar, but no acute intervention  On my exam patient is awake and alert He has abrasions to his nose and face. He is in a cervical collar. He can move all 4 extremities without difficulty.  However he does report pain in both arms.  Distal pulses intact Concern for possible central cord syndrome  Will place order for MRI C-spine However he can be admitted to medical service for syncope and collapse. Discussed with Dr. Tobie with [facility]   Midge Golas, MD 12/26/23 [phone]

## 2023-12-26 NOTE — Care Management Obs Status (Signed)
 MEDICARE OBSERVATION STATUS NOTIFICATION   Patient Details  Name: DARTANYON FRANKOWSKI MRN: [id number] Date of Birth: 1926/12/12   Medicare Observation Status Notification Given:  Yes    Corean JAYSON Canary, RN 12/26/2023, 4:56 PM

## 2023-12-26 NOTE — ED Notes (Signed)
 Consulted Dr. Drusilla in regards to Dr. Vertie note and discussion with the pt to discontinue his Eliquis . Eliquis  is due at 1000, MD requested to advise on medication administration. Dr. Drusilla requested to hold the Rx at this time.

## 2023-12-27 ENCOUNTER — Other Ambulatory Visit (INDEPENDENT_AMBULATORY_CARE_PROVIDER_SITE_OTHER)

## 2023-12-27 ENCOUNTER — Other Ambulatory Visit: Payer: Self-pay | Admitting: Student

## 2023-12-27 DIAGNOSIS — S0083XA Contusion of other part of head, initial encounter: Secondary | ICD-10-CM | POA: Diagnosis not present

## 2023-12-27 DIAGNOSIS — I483 Typical atrial flutter: Secondary | ICD-10-CM | POA: Diagnosis not present

## 2023-12-27 DIAGNOSIS — R55 Syncope and collapse: Secondary | ICD-10-CM

## 2023-12-27 DIAGNOSIS — S12501A Unspecified nondisplaced fracture of sixth cervical vertebra, initial encounter for closed fracture: Secondary | ICD-10-CM | POA: Diagnosis not present

## 2023-12-27 DIAGNOSIS — S62522A Displaced fracture of distal phalanx of left thumb, initial encounter for closed fracture: Secondary | ICD-10-CM

## 2023-12-27 DIAGNOSIS — S62235D Other nondisplaced fracture of base of first metacarpal bone, left hand, subsequent encounter for fracture with routine healing: Secondary | ICD-10-CM | POA: Diagnosis not present

## 2023-12-27 DIAGNOSIS — S12591A Other nondisplaced fracture of sixth cervical vertebra, initial encounter for closed fracture: Secondary | ICD-10-CM | POA: Diagnosis not present

## 2023-12-27 DIAGNOSIS — L8990 Pressure ulcer of unspecified site, unspecified stage: Secondary | ICD-10-CM | POA: Insufficient documentation

## 2023-12-27 LAB — CBC
HCT: 37.6 % — ABNORMAL LOW (ref 39.0–52.0)
Hemoglobin: 12.5 g/dL — ABNORMAL LOW (ref 13.0–17.0)
MCH: 32.2 pg (ref 26.0–34.0)
MCHC: 33.2 g/dL (ref 30.0–36.0)
MCV: 96.9 fL (ref 80.0–100.0)
Platelets: 233 K/uL (ref 150–400)
RBC: 3.88 MIL/uL — ABNORMAL LOW (ref 4.22–5.81)
RDW: 13.9 % (ref 11.5–15.5)
WBC: 7.3 K/uL (ref 4.0–10.5)
nRBC: 0 % (ref 0.0–0.2)

## 2023-12-27 LAB — COMPREHENSIVE METABOLIC PANEL WITH GFR
ALT: 13 U/L (ref 0–44)
AST: 17 U/L (ref 15–41)
Albumin: 2.8 g/dL — ABNORMAL LOW (ref 3.5–5.0)
Alkaline Phosphatase: 51 U/L (ref 38–126)
Anion gap: 5 (ref 5–15)
BUN: 19 mg/dL (ref 8–23)
CO2: 27 mmol/L (ref 22–32)
Calcium: 8.6 mg/dL — ABNORMAL LOW (ref 8.9–10.3)
Chloride: 104 mmol/L (ref 98–111)
Creatinine, Ser: 0.93 mg/dL (ref 0.61–1.24)
GFR, Estimated: >60 mL/min (ref >60)
Glucose, Bld: 112 mg/dL — ABNORMAL HIGH (ref 70–99)
Potassium: 3.6 mmol/L (ref 3.5–5.1)
Sodium: 136 mmol/L (ref 135–145)
Total Bilirubin: 0.5 mg/dL (ref 0.0–1.2)
Total Protein: 5.7 g/dL — ABNORMAL LOW (ref 6.5–8.1)

## 2023-12-27 MED ORDER — FUROSEMIDE 40 MG PO TABS: 40.0000 mg | ORAL_TABLET | ORAL | 1 refills | Status: AC

## 2023-12-27 MED ORDER — HYDROCODONE-ACETAMINOPHEN 5-325 MG PO TABS: 1.0000 | ORAL_TABLET | Freq: Four times a day (QID) | ORAL | 0 refills | Status: DC | PRN

## 2023-12-27 MED ORDER — GABAPENTIN 300 MG PO CAPS: 300.0000 mg | ORAL_CAPSULE | Freq: Three times a day (TID) | ORAL | 1 refills | Status: DC

## 2023-12-27 NOTE — TOC Transition Note (Signed)
 Transition of Care (TOC) - Discharge Note Rayfield Gobble RN, BSN Inpatient Care Management Unit 4E- RN Case Manager See Treatment Team for direct phone #   Patient Details  Name: Donald [name] MRN: [id number] Date of Birth: 1926-07-30  [facility]) CM/SW Contact:  Gobble Rayfield Hurst, RN Phone Number: 12/27/2023, 4:33 PM   Clinical Narrative:    Pt stable for transition home today, orders placed for [facility] and DME needs.   CM spoke with pt and daughter at bedside. Discussed therapy needs- pt voiced he would like to do therapy at [facility] with their in-house therapy.  Confirmed he needs rollator- no preference in provider.   Call made to [facility] to confirm in house provider for therapy- [facility]- fax # for orders- [phone]- HH orders faxed- they will f/u with patient.   Referral for DME- rollator sent to [employer] for DME delivery prior to discharge.   No further CM needs noted.  Family to transport home.    Final next level of care: Home w Home Health Services Barriers to Discharge: No Barriers Identified   Patient Goals and CMS Choice Patient states their goals for this hospitalization and ongoing recovery are:: wants to return home and get back to doing his normal things   Choice offered to / list presented to : Patient, Adult Children      Discharge Placement               In house PT/OT at [facility]        Discharge Plan and Services Additional resources added to the After Visit Summary for     Discharge Planning Services: CM Consult Post Acute Care Choice: Home Health, Durable Medical Equipment          DME Arranged: Walker rolling with seat DME Agency: [employer] Date DME Agency Contacted: 12/27/23 Time DME Agency Contacted: 1415 Representative spoke with at DME Agency: Darlyn HH Arranged: PT, OT HH Agency: Other - See comment ([facility]- In house provider- [facility]) Date HH Agency Contacted: 12/27/23 Time HH  Agency Contacted: 1415 Representative spoke with at [facility] Agency: orders faxed  Social Drivers of Health (SDOH) Interventions SDOH Screenings   Food Insecurity: No Food Insecurity (12/26/2023)  Housing: Low Risk  (12/26/2023)  Transportation Needs: No Transportation Needs (12/26/2023)  Utilities: Not At Risk (12/26/2023)  Social Connections: Moderately Integrated (12/26/2023)  Tobacco Use: Low Risk  (12/25/2023)     Readmission Risk Interventions     No data to display

## 2023-12-27 NOTE — Progress Notes (Incomplete)
 [facility]  PROGRESS NOTE  Donald [name] FMW:[id number] DOB: Sep 01, 1926 DOA: 12/25/2023 PCP: [name], Richard W, MD   Brief HPI:   89 y.o. male with medical history significant for CAD s/p CABG, PAF on Eliquis , HFmrEF, mod-severe TR, hypothyroidism, BPH who presented to the ED for evaluation after a fall and injury to his head.  CT head and cervical spine without contrast: IMPRESSION: 1. No acute intracranial abnormality. 2. Acute nondisplaced oblique fracture through the anterior inferior C6 vertebral body. 3. Question nondisplaced bilateral nasal bone fractures. 4. Central forehead hematoma. No calvarial fracture Left hand x-ray IMPRESSION: 1. Findings which may represent a nondisplaced fracture of indeterminate age involving the base of the first left metacarpal.  Assessment/Plan:   Acute oblique nondisplaced fracture of anterior inferior C6 vertebral body: Traumatic injury occurring after an apparent syncopal event and fall.  EDP spoke with neurosurgery team who recommended keeping patient in C-spine collar.  Patient is reporting numbness/tingling sensation rating down both upper extremities. -MRI C-spine obtained which was negative for spinal cord compression -Neurosurgery recommends to continue with collar and follow-up as outpatient - Will start gabapentin 300 mg p.o. 3 times daily - PT OT eval  Syncope with collapse: Patient with syncopal episode and fall without prodrome.  He reports a history of orthostatic hypotension.  Recent TTE 12/06/2023 showed EF 40-45%, mild mitral valve regurgitation, moderate to severe tricuspid valve regurgitation.  Noted to be in A-fib with variable rate. - Continue monitoring on telemetry - Recently had echocardiogram which showed EF of 40 to 45% -Cardiology following, likely will need event monitor as outpatient   Chronic HFmrEF: Appears euvolemic on admission.   Holding home Lasix  for now.   GDMT has been limited due to history of  orthostasis.     CAD s/p CABG: Patient denies chest pain.  Initial troponin negative.  EKG without significant ST changes.  Continue statin, holding Eliquis  as above.   Paroxysmal atrial fibrillation: Appears to be in atrial fibrillation with variable rate.  Continue amiodarone .  Holding Eliquis  for now given active bleeding from forehead laceration.  Has not been on beta-blocker due to history of orthostasis.  - Patient takes Eliquis  at home - He is also on amiodarone  -Amiodarone  was continued and Eliquis  was held -Cardiology consulted, recommend to discontinue Eliquis  - Continue monitoring on telemetry  Acute kidney injury - Came with BUN/creatinine of 30/1.30 - Improved to BUNs/creatinine of 26/1.01  Hypothyroidism: Continue Synthroid .   BPH: Holding Flomax  for now due to soft blood pressure   Nondisplaced fracture base of first left metacarpal: Noted on x-ray.  Thumb splint in place.   ?  Nondisplaced bilateral nasal bone fractures: Noted on CT imaging.  Can follow-up with ENT as an outpatient.    Medications     amiodarone   100 mg Oral Daily   apixaban   5 mg Oral BID   gabapentin  300 mg Oral TID   levothyroxine   75 mcg Oral Q0600   pravastatin   40 mg Oral QPM   sodium chloride  flush  3 mL Intravenous Q12H     Data Reviewed:   CBG:  No results for input(s): GLUCAP in the last 168 hours.  SpO2: 95 %    Vitals:   12/27/23 0010 12/27/23 0554 12/27/23 0602 12/27/23 0742  BP: 100/84  92/70 (!) 106/44  Pulse: 79 69 80 100  Resp: 16 18 15 19   Temp: 98 F (36.7 C)  97.7 F (36.5 C) (!) 97 F (36.1 C)  TempSrc: Oral  Oral Oral  SpO2: 96% 96%  95%  Weight:  74.8 kg    Height:          Data Reviewed:  Basic Metabolic Panel: Recent Labs  Lab 12/25/23 2220 12/25/23 2222 12/26/23 0514 12/27/23 0338  NA 138 139 138 136  K 3.7 3.7 3.8 3.6  CL 100 99 102 104  CO2 27  --  25 27  GLUCOSE 109* 110* 125* 112*  BUN 29* 30* 26* 19  CREATININE 1.14  1.30* 1.01 0.93  CALCIUM 9.2  --  8.7* 8.6*    CBC: Recent Labs  Lab 12/25/23 2220 12/25/23 2222 12/26/23 0514 12/27/23 0338  WBC 8.0  --  9.1 7.3  HGB 13.6 14.6 12.4* 12.5*  HCT 41.4 43.0 37.8* 37.6*  MCV 97.9  --  96.4 96.9  PLT 257  --  254 233    LFT Recent Labs  Lab 12/27/23 0338  AST 17  ALT 13  ALKPHOS 51  BILITOT 0.5  PROT 5.7*  ALBUMIN 2.8*     Antibiotics: Anti-infectives (From admission, onward)    None        DVT prophylaxis: SCDs  Code Status: DNR  Family Communication: Discussed with patient's daughter at bedside   CONSULTS cardiology   Subjective      Objective    Physical Examination:      Status is: Inpatient:      Wound 12/26/23 1500 Pressure Injury Buttocks Medial Stage 1 -  Intact skin with non-blanchable redness of a localized area usually over a bony prominence. (Active)        Donald [name]   [facility] If 7PM-7AM, please contact night-coverage at [email or web address], Office  [phone]   12/27/2023, 8:03 AM  LOS: 0 days

## 2023-12-27 NOTE — Progress Notes (Signed)
 Ordered a 2 week live Zio monitor for further evaluatin of syncope. Please see Dr. Skeeter rounding note from today for more information.  Brandilynn Taormina E Harini Dearmond, PA-C 12/27/2023 3:40 PM

## 2023-12-27 NOTE — Plan of Care (Signed)
   Problem: Activity: Goal: Risk for activity intolerance will decrease Outcome: Progressing   Problem: Coping: Goal: Level of anxiety will decrease Outcome: Progressing

## 2023-12-27 NOTE — Evaluation (Signed)
 Occupational Therapy Evaluation Patient Details Name: Donald [name] MRN: [id number] DOB: [birth year] Today's Date: 12/27/2023   History of Present Illness   89 y.o. male  presented 9/27 to the ED for evaluation after a fall and injury to his head. Acute oblique nondisplaced fracture of anterior inferior C6 vertebral body, Syncope with collapse, Nondisplaced fracture base of first left metacarpal. PMH paroxysmal atrial fibrillation, coronary artery disease, ischemic cardiomyopathy, history of prostate cancer, prior CVA     Clinical Impressions Pt ind at baseline with ADL/functional mobility, lives in ILF.Pt currently needing up to min A for ADLs, CGA for bed mobility and CGA for transfers without AD. Pt educated on cervical precautions and handout provided, however will benefit from continued reinforcement. Educated on collar wear and compensatory strategies for ADLs. Pt presenting with impairments listed below, will follow acutely. Recommend HHOT at ILF at d/c.      If plan is discharge home, recommend the following:   A little help with walking and/or transfers;A little help with bathing/dressing/bathroom;Assistance with cooking/housework;Assist for transportation     Functional Status Assessment   Patient has had a recent decline in their functional status and demonstrates the ability to make significant improvements in function in a reasonable and predictable amount of time.     Equipment Recommendations   None recommended by OT (pt has all needed DME)     Recommendations for Other Services   PT consult     Precautions/Restrictions   Precautions Precautions: Cervical Recall of Precautions/Restrictions: Impaired Required Braces or Orthoses: Cervical Brace Cervical Brace: Hard collar Restrictions Weight Bearing Restrictions Per Provider Order: No Other Position/Activity Restrictions: LUE thumb spica splint     Mobility Bed Mobility Overal bed mobility: Needs  Assistance Bed Mobility: Rolling, Sidelying to Sit Rolling: Contact guard assist Sidelying to sit: Contact guard assist       General bed mobility comments: educated on log roll, pt wti no recall    Transfers Overall transfer level: Needs assistance Equipment used: 1 person hand held assist Transfers: Sit to/from Stand Sit to Stand: Contact guard assist, Supervision                  Balance Overall balance assessment: Needs assistance Sitting-balance support: Feet supported, No upper extremity supported Sitting balance-Leahy Scale: Good     Standing balance support: No upper extremity supported Standing balance-Leahy Scale: Fair                             ADL either performed or assessed with clinical judgement   ADL Overall ADL's : Needs assistance/impaired Eating/Feeding: Set up;Sitting   Grooming: Set up;Standing   Upper Body Bathing: Minimal assistance;Sitting;Standing   Lower Body Bathing: Sitting/lateral leans;Sit to/from stand;Minimal assistance   Upper Body Dressing : Minimal assistance;Sitting;Standing   Lower Body Dressing: Sit to/from stand;Sitting/lateral leans;Minimal assistance   Toilet Transfer: Minimal assistance;Ambulation           Functional mobility during ADLs: Minimal assistance       Vision   Vision Assessment?: No apparent visual deficits     Perception Perception: Not tested       Praxis Praxis: Not tested       Pertinent Vitals/Pain Pain Assessment Pain Assessment: Faces Pain Score: 4  Faces Pain Scale: Hurts little more Pain Location: neck Pain Descriptors / Indicators: Aching Pain Intervention(s): Limited activity within patient's tolerance, Monitored during session, Repositioned     Extremity/Trunk Assessment Upper  Extremity Assessment Upper Extremity Assessment: Generalized weakness   Lower Extremity Assessment Lower Extremity Assessment: Defer to PT evaluation   Cervical / Trunk  Assessment Cervical / Trunk Assessment: Normal   Communication Communication Communication: Impaired Factors Affecting Communication: Hearing impaired   Cognition Arousal: Alert Behavior During Therapy: WFL for tasks assessed/performed Cognition: No apparent impairments                               Following commands: Intact       Cueing  General Comments   Cueing Techniques: Verbal cues  VSS   Exercises     Shoulder Instructions      Home Living Family/patient expects to be discharged to:: Private residence Living Arrangements: Alone Available Help at Discharge: Family;Friend(s);Available PRN/intermittently Type of Home: Independent living facility Home Access: Level entry     Home Layout: One level     Bathroom Shower/Tub: Producer, television/film/video: Handicapped height Bathroom Accessibility: Yes   Home Equipment: Grab bars - tub/shower;Rolling Walker (2 wheels);Cane - single point;Shower seat - built in;Grab bars - toilet;Hand held shower head;Lift chair          Prior Functioning/Environment Prior Level of Function : Independent/Modified Independent;History of Falls (last six months)             Mobility Comments: Ind, active. 1 fall this year from tripping, one fall from syncope. ADLs Comments: Ind active with ILF - goes out into the community.    OT Problem List: Decreased strength;Decreased range of motion;Decreased activity tolerance;Impaired balance (sitting and/or standing);Decreased cognition;Decreased safety awareness   OT Treatment/Interventions: Self-care/ADL training;Therapeutic exercise;Energy conservation;DME and/or AE instruction;Therapeutic activities;Balance training;Patient/family education      OT Goals(Current goals can be found in the care plan section)   Acute Rehab OT Goals Patient Stated Goal: none stated OT Goal Formulation: With patient Time For Goal Achievement: 01/10/24 Potential to Achieve  Goals: Good ADL Goals Pt Will Perform Upper Body Dressing: with modified independence;sitting Pt Will Perform Lower Body Dressing: with modified independence;sitting/lateral leans;sit to/from stand Pt Will Transfer to Toilet: with modified independence;ambulating;regular height toilet Pt Will Perform Tub/Shower Transfer: Shower transfer;with modified independence;ambulating;shower seat   OT Frequency:  Min 2X/week    Co-evaluation              AM-PAC OT 6 Clicks Daily Activity     Outcome Measure Help from another person eating meals?: A Little Help from another person taking care of personal grooming?: A Little Help from another person toileting, which includes using toliet, bedpan, or urinal?: A Little Help from another person bathing (including washing, rinsing, drying)?: A Little Help from another person to put on and taking off regular upper body clothing?: A Little Help from another person to put on and taking off regular lower body clothing?: A Little 6 Click Score: 18   End of Session Nurse Communication: Mobility status  Activity Tolerance: Patient tolerated treatment well Patient left: in bed;with call bell/phone within reach;with bed alarm set;with nursing/sitter in room  OT Visit Diagnosis: Unsteadiness on feet (R26.81);Other abnormalities of gait and mobility (R26.89);Muscle weakness (generalized) (M62.81)                Time: 8585-8550 OT Time Calculation (min): 35 min Charges:  OT General Charges $OT Visit: 1 Visit OT Evaluation $OT Eval Moderate Complexity: 1 Mod OT Treatments $Self Care/Home Management : 8-22 mins  Carrson Lightcap K, OTD, OTR/L SecureChat  Preferred Acute Rehab [phone]   Jahnasia Tatum K Koonce 12/27/2023, 3:01 PM

## 2023-12-27 NOTE — Discharge Summary (Addendum)
 Physician Discharge Summary   Patient: Donald [name] MRN: [id number] DOB: [birth year]  Admit date:     12/25/2023  Discharge date: 12/27/23  Discharge Physician: Sabas GORMAN Brod   PCP: [name], Richard W, MD   Recommendations at discharge:   Follow-up cardiology as outpatient Follow-up neurosurgery as outpatient; continue cervical collar as directed Follow-up orthopedics as outpatient, continue thumb spica splint in the left hand Follow-up with ENT as outpatient for bilateral nasal bone fracture  Discharge Diagnoses: Principal Problem:   C6 cervical fracture (HCC) Active Problems:   Paroxysmal atrial fibrillation (HCC)   Chronic heart failure with mildly reduced ejection fraction (HFmrEF, 41-49%) (HCC)   Coronary artery disease/ history of CABG   Hypothyroidism   Fracture of metacarpal base, first, left hand, closed   Syncope and collapse   Pressure injury of skin  Resolved Problems:   * No resolved hospital problems. *  Hospital Course: 89 y.o. male with medical history significant for CAD s/p CABG, PAF on Eliquis , HFmrEF, mod-severe TR, hypothyroidism, BPH who presented to the ED for evaluation after a fall and injury to his head.  CT head and cervical spine without contrast: IMPRESSION: 1. No acute intracranial abnormality. 2. Acute nondisplaced oblique fracture through the anterior inferior C6 vertebral body. 3. Question nondisplaced bilateral nasal bone fractures. 4. Central forehead hematoma. No calvarial fracture Left hand x-ray IMPRESSION: 1. Findings which may represent a nondisplaced fracture of indeterminate age involving the base of the first left metacarpal.  Assessment and Plan:  Acute oblique nondisplaced fracture of anterior inferior C6 vertebral body: Traumatic injury occurring after an apparent syncopal event and fall.  EDP spoke with neurosurgery team who recommended keeping patient in C-spine collar.  Patient is reporting numbness/tingling sensation  rating down both upper extremities. -MRI C-spine obtained which was negative for spinal cord compression -Neurosurgery recommends to continue with collar and follow-up as outpatient - Will start gabapentin 300 mg p.o. 3 times daily - Will need outpatient PT   Syncope with collapse: Patient with syncopal episode and fall without prodrome.  He reports a history of orthostatic hypotension.  Recent TTE 12/06/2023 showed EF 40-45%, mild mitral valve regurgitation, moderate to severe tricuspid valve regurgitation.  Noted to be in A-fib with variable rate. ? Vasovagal, orthostatic hypotension - Recently had echocardiogram which showed EF of 40 to 45% -Cardiology following, likely will need event monitor as outpatient -Cardiology has cleared for discharge.  Recommend to cut down the Lasix  to 40 mg p.o. Monday Wednesday Friday.    Chronic HFmrEF: Appears euvolemic on admission.   Holding home Lasix  for now.   GDMT has been limited due to history of orthostasis.   -Lasix  dose changed to 40 mg p.o. Monday Wednesday and Friday   CAD s/p CABG: Patient denies chest pain.  Initial troponin negative.  EKG without significant ST changes.  Continue statin, holding Eliquis  as above.   Paroxysmal atrial fibrillation: Appears to be in atrial fibrillation with variable rate.  Continue amiodarone .  Holding Eliquis  for now given active bleeding from forehead laceration.  Has not been on beta-blocker due to history of orthostasis.  - Patient takes Eliquis  at home - He is also on amiodarone  -Amiodarone  was continued and Eliquis  was held -Cardiology consulted, recommend to discontinue Eliquis     Acute kidney injury - Came with BUN/creatinine of 30/1.30 - Improved to BUNs/creatinine of 26/1.01   Hypothyroidism: Continue Synthroid .   BPH: Holding Flomax  for now due to soft blood pressure   Nondisplaced fracture  base of first left metacarpal: Noted on x-ray.  Thumb splint in place. -Need to follow-up with  [facility] as outpatient.  Patient follows with [facility] for orthopedics needs.   ?  Nondisplaced bilateral nasal bone fractures: Noted on CT imaging.  He will need to follow-up with ENT as an outpatient. -Called and discussed with patient's granddaughter          Consultants: Cardiology Procedures performed:   Disposition: Home Diet recommendation:  Discharge Diet Orders (From admission, onward)     Start     Ordered   12/27/23 0000  Diet - low sodium heart healthy        12/27/23 1405           Regular diet DISCHARGE MEDICATION: Allergies as of 12/27/2023       Reactions   Beta Adrenergic Blockers Other (See Comments)   Other reaction(s): asthma exacerbation   Keflex [cephalexin] Nausea And Vomiting, Other (See Comments)   'Killed all GI enzymes (also) and patient prefers to not take this   Morphine Nausea Only        Medication List     STOP taking these medications    Eliquis  5 MG Tabs tablet Generic drug: apixaban    tamsulosin  0.4 MG Caps capsule Commonly known as: FLOMAX        TAKE these medications    alendronate 70 MG tablet Commonly known as: FOSAMAX Take 70 mg by mouth every Sunday. Take with a full glass of water  on an empty stomach.   amiodarone  100 MG tablet Commonly known as: Pacerone  Take 1 tablet (100 mg total) by mouth daily.   furosemide  40 MG tablet Commonly known as: LASIX  Take 1 tablet (40 mg total) by mouth every Monday, Wednesday, and Friday. You can take extra tablet of Lasix  40 mg if needed for shortness of breath or leg swelling What changed:  how much to take when to take this additional instructions   gabapentin 300 MG capsule Commonly known as: NEURONTIN Take 1 capsule (300 mg total) by mouth 3 (three) times daily.   HYDROcodone -acetaminophen  5-325 MG tablet Commonly known as: NORCO/VICODIN Take 1-2 tablets by mouth every 6 (six) hours as needed for moderate pain (pain score 4-6) or severe pain (pain score  7-10).   ICAPS AREDS 2 PO Take 1 tablet by mouth daily.   levothyroxine  75 MCG tablet Commonly known as: SYNTHROID  Take 75 mcg by mouth daily before breakfast.   loratadine  10 MG tablet Commonly known as: CLARITIN  Take 10 mg by mouth daily.   multivitamin with minerals Tabs tablet Take 1 tablet by mouth daily.   pravastatin  40 MG tablet Commonly known as: PRAVACHOL  TAKE 1 TABLET EVERY EVENING   Vitamin D  50 MCG (2000 UT) tablet Take 2,000 Units by mouth daily.               Durable Medical Equipment  (From admission, onward)           Start     Ordered   12/27/23 1411  For home use only DME 4 wheeled rolling walker with seat  Once       Question:  Patient needs a walker to treat with the following condition  Answer:  Syncope   12/27/23 1410            Follow-up Information     [facility] at [facility]. Go to .   Specialty: Emergency Medicine Why: If symptoms worsen Contact information: [address]  [address] [place]  [address] [phone]        Onetha Kuba, MD. Schedule an appointment as soon as possible for a visit in 2 week(s).   Specialty: Neurosurgery Contact information: [address] [place] [address] [phone]         [name], Charlie ORN, MD Follow up.   Specialty: Internal Medicine Why: Will need referral to ENT for bilateral nasal bone fracture, orthopedics for left first metacarpal fracture of the hand Contact information: [address] [place] [address] [phone]         Pietro Redell RAMAN, MD. Schedule an appointment as soon as possible for a visit.   Specialty: Cardiology Contact information: [address] [place] [address] [phone]                Discharge Exam: Donald [name]   12/25/23 2227 12/27/23 0554  Weight: 68 kg 74.8 kg   General-appears in no acute distress Heart-S1-S2, regular, no murmur  auscultated Lungs-clear to auscultation bilaterally, no wheezing or crackles auscultated Abdomen-soft, nontender, no organomegaly Extremities-no edema in the lower extremities Neuro-alert, oriented x3, no focal deficit noted  Condition at discharge: good  The results of significant diagnostics from this hospitalization (including imaging, microbiology, ancillary and laboratory) are listed below for reference.   Imaging Studies: MR Cervical Spine Wo Contrast Result Date: 12/26/2023 CLINICAL DATA:  Initial evaluation for acute neck trauma. EXAM: MRI CERVICAL SPINE WITHOUT CONTRAST TECHNIQUE: Multiplanar, multisequence MR imaging of the cervical spine was performed. No intravenous contrast was administered. COMPARISON:  CT from 12/25/2023. FINDINGS: Alignment: Reversal of the normal cervical lordosis, apex at C5-6. 2 mm degenerative anterolisthesis of C7 on T1. Vertebrae: Acute nondisplaced fracture involving the anterior aspect of the inferior endplate of C6 again seen, stable. Vertebral body height maintained. No other acute fracture elsewhere within the cervical spine. Underlying bone marrow signal intensity within normal limits. No worrisome osseous lesions. No other abnormal marrow edema. Cord: Normal signal and morphology. No evidence for traumatic cord injury. Posterior Fossa, vertebral arteries, paraspinal tissues: Visualized brain and posterior fossa within normal limits. Craniocervical junction normal. Prevertebral edema seen at C6-7 through T1-2, consistent with the acute trauma. There is presumed injury if not frank destruction of the anterior longitudinal ligament at the level of the C6 fracture. No other evidence for ligamentous injury. Normal flow voids seen within the vertebral arteries bilaterally. Disc levels: C2-C3: Small central disc protrusion mildly indents the ventral thecal sac. Mild disc bulge. Chronic endplate Schmorl's node deformity at the inferior endplate of C2. Mild facet  spurring. No spinal stenosis. Foramina remain patent. C3-C4: Degenerate intervertebral disc space narrowing with diffuse disc osteophyte complex. Flattening and effacement of the ventral thecal sac. Superimposed left greater than right mild facet and ligament flavum hypertrophy. Resultant severe spinal stenosis with the thecal sac measuring 6 mm in AP diameter. Secondary cord flattening but no visible cord signal changes. Moderate left C4 foraminal stenosis. Right neural foramina remains patent. C4-C5: Degenerative disc spacing with diffuse disc osteophyte complex. Flattening and partial effacement of the ventral thecal sac. Mild spinal stenosis. Mild right C5 foraminal narrowing. Left neural foramen remains patent. C5-C6: Degenerate intervertebral disc space narrowing with circumferential disc osteophyte complex. Posterior component effaces the ventral thecal sac. Secondary cord flattening without cord signal changes. Severe spinal stenosis with the thecal sac measuring 6 mm in AP diameter. Severe bilateral C6 foraminal stenosis. C6-C7: Degenerative intervertebral disc space narrowing with diffuse disc osteophyte complex. Flattening of  the ventral thecal sac with resultant mild spinal stenosis. Severe left with moderate to severe right C7 foraminal narrowing. C7-T1: Anterolisthesis. Disc desiccation without significant disc bulge. Mild-to-moderate bilateral facet hypertrophy. No spinal stenosis. Foramina remain patent. IMPRESSION: 1. Acute nondisplaced fracture involving the anterior aspect of the inferior endplate of C6, stable. Associated prevertebral edema at C6-7 through T1-2. Presumed injury if not frank disruption of the anterior longitudinal ligament at the level of the C6 fracture. No other evidence for ligamentous injury. 2. No other acute traumatic injury within the cervical spine or spinal cord. 3. Advanced multilevel cervical spondylosis with resultant severe spinal stenosis at C3-4 and C5-6. Secondary  cord flattening at these levels without cord signal changes. 4. Multifactorial degenerative changes with resultant multilevel foraminal narrowing as above. Notable findings include moderate left C4 foraminal stenosis, severe bilateral C6 foraminal narrowing, with severe left and moderate to severe right C7 foraminal stenosis. Electronically Signed   By: Morene Hoard M.D.   On: 12/26/2023 02:08   CT Head Wo Contrast Result Date: 12/25/2023 EXAM: CT HEAD AND CERVICAL SPINE 12/25/2023 10:42:32 PM TECHNIQUE: CT of the head and cervical spine was performed without the administration of intravenous contrast. Multiplanar reformatted images are provided for review. Automated exposure control, iterative reconstruction, and/or weight based adjustment of the mA/kV was utilized to reduce the radiation dose to as low as reasonably achievable. COMPARISON: CT head made 12:29 and CT cervical spine 07/24/2017. CLINICAL HISTORY: Polytrauma, blunt. Syncope, fall and hit head, on thinners, hematoma and laceration to forehead. FINDINGS: CT HEAD BRAIN AND VENTRICLES: No acute intracranial hemorrhage. No mass effect or midline shift. No abnormal extra-axial fluid collection. 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 No hydrocephalus. ORBITS: No acute abnormality. SINUSES AND MASTOIDS: Mucosal thickening and frothy debris in the ethmoid air cells and maxillary and sphenoid sinuses. SOFT TISSUES AND SKULL: Central forehead hematoma. No calvarial fracture. Question nondisplaced bilateral nasal bone fractures. CT CERVICAL SPINE BONES AND ALIGNMENT: Acute nondisplaced fracture through the anterior inferior C6 vertebral body. No traumatic malalignment. DEGENERATIVE CHANGES: Multilevel spondylosis, disc space height loss, and degenerative endplate changes greatest at C5-C6 and C6-C7 where they are advanced. Posterior disc osteophyte complex at C5-C6 causes mild-to-moderate effacement of the ventral thecal sac. SOFT TISSUES:  No prevertebral soft tissue swelling. IMPRESSION: 1. No acute intracranial abnormality. 2. Acute nondisplaced oblique fracture through the anterior inferior C6 vertebral body. 3. Question nondisplaced bilateral nasal bone fractures. 4. Central forehead hematoma. No calvarial fracture. Electronically signed by: Norman Gatlin MD 12/25/2023 11:06 PM EDT RP Workstation: [id number]   CT Cervical Spine Wo Contrast Result Date: 12/25/2023 EXAM: CT HEAD AND CERVICAL SPINE 12/25/2023 10:42:32 PM TECHNIQUE: CT of the head and cervical spine was performed without the administration of intravenous contrast. Multiplanar reformatted images are provided for review. Automated exposure control, iterative reconstruction, and/or weight based adjustment of the mA/kV was utilized to reduce the radiation dose to as low as reasonably achievable. COMPARISON: CT head made 12:29 and CT cervical spine 07/24/2017. CLINICAL HISTORY: Polytrauma, blunt. Syncope, fall and hit head, on thinners, hematoma and laceration to forehead. FINDINGS: CT HEAD BRAIN AND VENTRICLES: No acute intracranial hemorrhage. No mass effect or midline shift. No abnormal extra-axial fluid collection. 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 No hydrocephalus. ORBITS: No acute abnormality. SINUSES AND MASTOIDS: Mucosal thickening and frothy debris in the ethmoid air cells and maxillary and sphenoid sinuses. SOFT TISSUES AND SKULL: Central forehead hematoma. No calvarial fracture. Question nondisplaced bilateral nasal bone  fractures. CT CERVICAL SPINE BONES AND ALIGNMENT: Acute nondisplaced fracture through the anterior inferior C6 vertebral body. No traumatic malalignment. DEGENERATIVE CHANGES: Multilevel spondylosis, disc space height loss, and degenerative endplate changes greatest at C5-C6 and C6-C7 where they are advanced. Posterior disc osteophyte complex at C5-C6 causes mild-to-moderate effacement of the ventral thecal sac. SOFT TISSUES: No  prevertebral soft tissue swelling. IMPRESSION: 1. No acute intracranial abnormality. 2. Acute nondisplaced oblique fracture through the anterior inferior C6 vertebral body. 3. Question nondisplaced bilateral nasal bone fractures. 4. Central forehead hematoma. No calvarial fracture. Electronically signed by: Norman Gatlin MD 12/25/2023 11:06 PM EDT RP Workstation: [id number]   DG Chest Portable 1 View Result Date: 12/25/2023 CLINICAL DATA:  Syncope. EXAM: PORTABLE CHEST 1 VIEW COMPARISON:  October 27, 2023 FINDINGS: Multiple sternal wires and vascular clips are seen. The heart size and mediastinal contours are within normal limits. There is marked severity calcification of the aortic arch. Mild atelectasis is seen within the left lung base. No pleural effusion or pneumothorax is identified. Multilevel degenerative changes are present throughout the thoracic spine. IMPRESSION: 1. Evidence of prior median sternotomy/CABG. 2. Mild left basilar atelectasis. Electronically Signed   By: Suzen Dials M.D.   On: 12/25/2023 22:56   DG Hand Complete Right Result Date: 12/25/2023 CLINICAL DATA:  Syncope. EXAM: RIGHT HAND - COMPLETE 3+ VIEW COMPARISON:  None Available. FINDINGS: There is no evidence of an acute fracture or dislocation. Mild chronic changes are seen involving the proximal portions of the fourth and fifth right metacarpals. Marked severity degenerative changes are present involving the first carpometacarpal and adjacent portions of the right triquetrum bone and base of the first right metacarpal. Additional degenerative changes are seen involving numerous interphalangeal joints. Soft tissues are unremarkable. IMPRESSION: 1. No acute fracture or dislocation. 2. Marked severity degenerative changes, as described above. Electronically Signed   By: Suzen Dials M.D.   On: 12/25/2023 22:54   DG Hand Complete Left Result Date: 12/25/2023 CLINICAL DATA:  Syncope. EXAM: LEFT HAND - COMPLETE 3+ VIEW  COMPARISON:  None Available. FINDINGS: An ill-defined, curvilinear cortical lucency of indeterminate age is seen involving the base of the first left metacarpal. This is of indeterminate age. Marked severity chronic and degenerative changes are seen involving the left trapezium bone and first carpometacarpal joint. Additional degenerative changes are noted throughout multiple interphalangeal joints. Soft tissues are unremarkable. IMPRESSION: 1. Findings which may represent a nondisplaced fracture of indeterminate age involving the base of the first left metacarpal. Correlation with physical examination is recommended to determine the presence of point tenderness. 2. Marked severity chronic and degenerative changes involving the left trapezium bone and first carpometacarpal joint. Electronically Signed   By: Suzen Dials M.D.   On: 12/25/2023 22:50   ECHOCARDIOGRAM COMPLETE Result Date: 12/06/2023    ECHOCARDIOGRAM REPORT   Patient Name:   BASTION BOLGER Date of Exam: 12/06/2023 Medical Rec #:  [id number]       Height:       69.5 in Accession #:    [id number]      Weight:       161.6 lb Date of Birth:  07/31/1926       BSA:          1.896 m Patient Age:    89 years        BP:           118/68 mmHg Patient Gender: M  HR:           60 bpm. Exam Location:  [address] Procedure: 2D Echo, Color Doppler, Cardiac Doppler and Intracardiac            Opacification Agent (Both Spectral and Color Flow Doppler were            utilized during procedure). Indications:    I25.10 Coronary artery disease  History:        Patient has prior history of Echocardiogram examinations, most                 recent 02/11/2022. CAD, CVA; Arrythmias:Atrial Fibrillation.                 Ischemic cardiomyopathy.  Sonographer:    Carl Rodgers-Jones RDCS Sonographer#2:  Edsel Kerns, Student Sonographer Referring Phys: [id number] BRIAN S CRENSHAW IMPRESSIONS  1. Akinesis of the distal septum and apex with overall mild to  moderate LV dysfunction; trileaflet aortic valve with fixed noncoronary cusp with likely mild AS (mean gradient 7.5 mmHg, DI 0.38, AVA 1.4 cm2); swirling noted at LV apex but no definite thrombus.  2. Left ventricular ejection fraction, by estimation, is 40 to 45%. The left ventricle has mildly decreased function. The left ventricle demonstrates regional wall motion abnormalities (see scoring diagram/findings for description). Left ventricular diastolic parameters are consistent with Grade I diastolic dysfunction (impaired relaxation).  3. Right ventricular systolic function is normal. The right ventricular size is mildly enlarged.  4. Left atrial size was severely dilated.  5. The mitral valve is normal in structure. Mild mitral valve regurgitation. No evidence of mitral stenosis.  6. Tricuspid valve regurgitation is moderate to severe.  7. The aortic valve is normal in structure. Aortic valve regurgitation is trivial. No aortic stenosis is present.  8. The inferior vena cava is normal in size with greater than 50% respiratory variability, suggesting right atrial pressure of 3 mmHg. FINDINGS  Left Ventricle: Left ventricular ejection fraction, by estimation, is 40 to 45%. The left ventricle has mildly decreased function. The left ventricle demonstrates regional wall motion abnormalities. Definity  contrast agent was given IV to delineate the left ventricular endocardial borders. The left ventricular internal cavity size was normal in size. There is no left ventricular hypertrophy. Left ventricular diastolic parameters are consistent with Grade I diastolic dysfunction (impaired relaxation). Right Ventricle: The right ventricular size is mildly enlarged. Right ventricular systolic function is normal. Left Atrium: Left atrial size was severely dilated. Right Atrium: Right atrial size was normal in size. Pericardium: There is no evidence of pericardial effusion. Mitral Valve: The mitral valve is normal in structure.  Mild mitral annular calcification. Mild mitral valve regurgitation. No evidence of mitral valve stenosis. Tricuspid Valve: The tricuspid valve is normal in structure. Tricuspid valve regurgitation is moderate to severe. No evidence of tricuspid stenosis. The aortic valve is normal in structure. Aortic valve regurgitation is trivial. No aortic stenosis is present. Pulmonic Valve: The pulmonic valve was normal in structure. Pulmonic valve regurgitation is trivial. No evidence of pulmonic stenosis. Aorta: The aortic root is normal in size and structure. Venous: The inferior vena cava is normal in size with greater than 50% respiratory variability, suggesting right atrial pressure of 3 mmHg. IAS/Shunts: No atrial level shunt detected by color flow Doppler. Additional Comments: Akinesis of the distal septum and apex with overall mild to moderate LV dysfunction; trileaflet aortic valve with fixed noncoronary cusp with likely mild AS (mean gradient 7.5 mmHg, DI 0.38, AVA 1.4 cm2);  swirling noted at LV apex but no definite thrombus.  LEFT VENTRICLE PLAX 2D LVIDd:         4.90 cm   Diastology LVIDs:         3.40 cm   LV e' medial:    7.07 cm/s LV PW:         0.90 cm   LV E/e' medial:  8.8 LV IVS:        0.90 cm   LV e' lateral:   8.76 cm/s LVOT diam:     2.10 cm   LV E/e' lateral: 7.1 LV SV:         61 LV SV Index:   32 LVOT Area:     3.46 cm  RIGHT VENTRICLE          IVC RV Basal diam:  5.10 cm  IVC diam: 1.80 cm LEFT ATRIUM           Index        RIGHT ATRIUM           Index LA diam:      4.90 cm 2.58 cm/m   RA Area:     33.40 cm LA Vol (A4C): 80.1 ml 42.24 ml/m  RA Volume:   144.00 ml 75.93 ml/m  AORTIC VALVE AV Area (Vmax):    1.40 cm AV Area (Vmean):   1.31 cm AV Area (VTI):     1.33 cm AV Vmax:           185.00 cm/s AV Vmean:          128.500 cm/s AV VTI:            0.464 m AV Peak Grad:      13.7 mmHg AV Mean Grad:      7.5 mmHg LVOT Vmax:         74.65 cm/s LVOT Vmean:        48.500 cm/s LVOT VTI:           0.178 m LVOT/AV VTI ratio: 0.38 AI PHT:            1547 msec  AORTA Ao Root diam: 3.80 cm Ao Asc diam:  3.40 cm MITRAL VALVE                TRICUSPID VALVE MV Area (PHT): 2.11 cm     TR Peak grad:   19.0 mmHg MV Decel Time: 359 msec     TR Vmax:        218.00 cm/s MR Peak grad: 91.4 mmHg MR Vmax:      478.00 cm/s   SHUNTS MV E velocity: 62.40 cm/s   Systemic VTI:  0.18 m MV A velocity: 115.00 cm/s  Systemic Diam: 2.10 cm MV E/A ratio:  0.54 Redell Shallow MD Electronically signed by Redell Shallow MD Signature Date/Time: 12/06/2023/1:33:57 PM    Final     Microbiology: Results for orders placed or performed during the hospital encounter of 12/25/23  Resp panel by RT-PCR (RSV, Flu A&B, Covid) Anterior Nasal Swab     Status: None   Collection Time: 12/26/23 12:32 AM   Specimen: Anterior Nasal Swab  Result Value Ref Range Status   SARS Coronavirus 2 by RT PCR NEGATIVE NEGATIVE Final   Influenza A by PCR NEGATIVE NEGATIVE Final   Influenza B by PCR NEGATIVE NEGATIVE Final    Comment: (NOTE) The Xpert Xpress SARS-CoV-2/FLU/RSV plus assay is intended as an aid in the diagnosis of influenza from  Nasopharyngeal swab specimens and should not be used as a sole basis for treatment. Nasal washings and aspirates are unacceptable for Xpert Xpress SARS-CoV-2/FLU/RSV testing.  Fact Sheet for Patients: BloggerCourse.com  Fact Sheet for Healthcare Providers: SeriousBroker.it  This test is not yet approved or cleared by the [place]  FDA and has been authorized for detection and/or diagnosis of SARS-CoV-2 by FDA under an Emergency Use Authorization (EUA). This EUA will remain in effect (meaning this test can be used) for the duration of the COVID-19 declaration under Section 564(b)(1) of the Act, 21 U.S.C. section 360bbb-3(b)(1), unless the authorization is terminated or revoked.     Resp Syncytial Virus by PCR NEGATIVE NEGATIVE Final    Comment:  (NOTE) Fact Sheet for Patients: BloggerCourse.com  Fact Sheet for Healthcare Providers: SeriousBroker.it  This test is not yet approved or cleared by the [place]  FDA and has been authorized for detection and/or diagnosis of SARS-CoV-2 by FDA under an Emergency Use Authorization (EUA). This EUA will remain in effect (meaning this test can be used) for the duration of the COVID-19 declaration under Section 564(b)(1) of the Act, 21 U.S.C. section 360bbb-3(b)(1), unless the authorization is terminated or revoked.  Performed at [facility], [address], [place], [place] [address]     Labs: CBC: Recent Labs  Lab 12/25/23 2220 12/25/23 2222 12/26/23 0514 12/27/23 0338  WBC 8.0  --  9.1 7.3  HGB 13.6 14.6 12.4* 12.5*  HCT 41.4 43.0 37.8* 37.6*  MCV 97.9  --  96.4 96.9  PLT 257  --  254 233   Basic Metabolic Panel: Recent Labs  Lab 12/25/23 2220 12/25/23 2222 12/26/23 0514 12/27/23 0338  NA 138 139 138 136  K 3.7 3.7 3.8 3.6  CL 100 99 102 104  CO2 27  --  25 27  GLUCOSE 109* 110* 125* 112*  BUN 29* 30* 26* 19  CREATININE 1.14 1.30* 1.01 0.93  CALCIUM 9.2  --  8.7* 8.6*   Liver Function Tests: Recent Labs  Lab 12/27/23 0338  AST 17  ALT 13  ALKPHOS 51  BILITOT 0.5  PROT 5.7*  ALBUMIN 2.8*   CBG: No results for input(s): GLUCAP in the last 168 hours.  Discharge time spent: greater than 30 minutes.  Signed: Sabas GORMAN Brod, MD [facility] 12/27/2023

## 2023-12-27 NOTE — Evaluation (Signed)
 Physical Therapy Evaluation Patient Details Name: Donald [name] MRN: [id number] DOB: Jan 08, 1927 Today's Date: 12/27/2023  History of Present Illness  89 y.o. male  presented 9/27 to the ED for evaluation after a fall and injury to his head. Acute oblique nondisplaced fracture of anterior inferior C6 vertebral body, Syncope with collapse, Nondisplaced fracture base of first left metacarpal. PMH paroxysmal atrial fibrillation, coronary artery disease, ischemic cardiomyopathy, history of prostate cancer, prior CVA  Clinical Impression  Pt admitted with above diagnosis. Previously independent, living at ILF, no device. One fall earlier this year after tripping and leaning on a rolling table. Denies previous syncopal apsides. Pt active in community. Feels close to baseline with mobility after evaluation. We determined he is most stable and safe using Rollator for support. Pt and daughter agree. Gait with improved symmetry and no overt LOB with light support from 4ww. See orthostatics below. Remains asymptomatic throughout session. Pt currently with functional limitations due to the deficits listed below (see PT Problem List). Pt will benefit from acute skilled PT to increase their independence and safety with mobility to allow discharge.         12/27/23 0900  Orthostatic Lying   BP- Lying 95/72  Pulse- Lying 90  Orthostatic Sitting  BP- Sitting 99/66  Pulse- Sitting 110  Orthostatic Standing at 0 minutes  BP- Standing at 0 minutes 109/57  Pulse- Standing at 0 minutes 109  Orthostatic Standing at 3 minutes  BP- Standing at 3 minutes 101/79  Pulse- Standing at 3 minutes 119       If plan is discharge home, recommend the following: A little help with walking and/or transfers;A little help with bathing/dressing/bathroom;Assistance with cooking/housework;Assist for transportation   Can travel by private Data processing manager (4 wheels)  Recommendations for  Other Services       Functional Status Assessment Patient has had a recent decline in their functional status and demonstrates the ability to make significant improvements in function in a reasonable and predictable amount of time.     Precautions / Restrictions Precautions Precautions: Cervical Recall of Precautions/Restrictions: Impaired Required Braces or Orthoses: Cervical Brace Cervical Brace: Hard collar Restrictions Weight Bearing Restrictions Per Provider Order: No      Mobility  Bed Mobility Overal bed mobility: Needs Assistance Bed Mobility: Rolling, Sidelying to Sit Rolling: Contact guard assist Sidelying to sit: Contact guard assist       General bed mobility comments: Educated on log roll technique with cervical collar in place. CGA to facilitate technique and direction. Effortful but completed safely.    Transfers Overall transfer level: Needs assistance Equipment used: Rolling walker (2 wheels), Rollator (4 wheels), None Transfers: Sit to/from Stand Sit to Stand: Contact guard assist, Supervision           General transfer comment: Performed from multiple surfaces with and without assistive devices. Pt most stable with rollator at supervision level. Reviewed precautions and techniques.    Ambulation/Gait Ambulation/Gait assistance: Supervision Gait Distance (Feet): 175 Feet (+100) Assistive device: Rolling walker (2 wheels), Rollator (4 wheels), None Gait Pattern/deviations: Step-through pattern, Decreased stride length, Trunk flexed, Wide base of support, Drifts right/left Gait velocity: dec Gait velocity interpretation: <1.8 ft/sec, indicate of risk for recurrent falls   General Gait Details: Practiced with RW, Rollator, and no device. Pt most stable using rollator. Reviewed symptom awareness, energy conservation, and precautions. No overt LOB. Supervision level with Rollator for light support. Feels most confident with  this device. Without device pt  gait deviations notably exacerbated.  Stairs            Wheelchair Mobility     Tilt Bed    Modified Rankin (Stroke Patients Only)       Balance Overall balance assessment: Needs assistance Sitting-balance support: Feet supported, No upper extremity supported Sitting balance-Leahy Scale: Good     Standing balance support: No upper extremity supported Standing balance-Leahy Scale: Fair                               Pertinent Vitals/Pain Pain Assessment Pain Assessment: Faces Faces Pain Scale: Hurts little more Pain Location: neck Pain Descriptors / Indicators: Aching Pain Intervention(s): Monitored during session, Repositioned, Limited activity within patient's tolerance    Home Living Family/patient expects to be discharged to:: Private residence ([facility]) Living Arrangements: Alone Available Help at Discharge: Family;Friend(s);Available PRN/intermittently Type of Home: Independent living facility Home Access: Level entry       Home Layout: One level Home Equipment: Grab bars - tub/shower;Rolling Walker (2 wheels);Cane - single point;Shower seat - built in;Grab bars - toilet;Hand held shower head;Lift chair      Prior Function Prior Level of Function : Independent/Modified Independent;History of Falls (last six months)             Mobility Comments: Ind, active. 1 fall this year from tripping, one fall from syncope. ADLs Comments: Ind active with ILF - goes out into the community.     Extremity/Trunk Assessment   Upper Extremity Assessment Upper Extremity Assessment: Defer to OT evaluation    Lower Extremity Assessment Lower Extremity Assessment: Generalized weakness       Communication   Communication Communication: Impaired Factors Affecting Communication: Hearing impaired    Cognition Arousal: Alert Behavior During Therapy: WFL for tasks assessed/performed   PT - Cognitive impairments: No apparent impairments                          Following commands: Intact       Cueing Cueing Techniques: Verbal cues     General Comments General comments (skin integrity, edema, etc.): Dtr present, supportive. Reviewed precautions. See orthostatics tab. pt asymptomatic throughout session    Exercises     Assessment/Plan    PT Assessment Patient needs continued PT services  PT Problem List Decreased strength;Decreased range of motion;Decreased activity tolerance;Decreased balance;Decreased mobility;Decreased knowledge of use of DME;Decreased knowledge of precautions;Cardiopulmonary status limiting activity;Impaired sensation;Pain       PT Treatment Interventions DME instruction;Gait training;Functional mobility training;Therapeutic activities;Therapeutic exercise;Balance training;Neuromuscular re-education;Cognitive remediation;Patient/family education;Modalities    PT Goals (Current goals can be found in the Care Plan section)  Acute Rehab PT Goals Patient Stated Goal: Get well, return to prior activities. PT Goal Formulation: With patient Time For Goal Achievement: 01/10/24 Potential to Achieve Goals: Good    Frequency Min 2X/week     Co-evaluation               AM-PAC PT 6 Clicks Mobility  Outcome Measure Help needed turning from your back to your side while in a flat bed without using bedrails?: A Little Help needed moving from lying on your back to sitting on the side of a flat bed without using bedrails?: A Little Help needed moving to and from a bed to a chair (including a wheelchair)?: A Little Help needed standing up from a  chair using your arms (e.g., wheelchair or bedside chair)?: A Little Help needed to walk in hospital room?: A Little Help needed climbing 3-5 steps with a railing? : A Little 6 Click Score: 18    End of Session Equipment Utilized During Treatment: Cervical collar Activity Tolerance: Patient tolerated treatment well Patient left: in chair;with  call bell/phone within reach;with chair alarm set;with family/visitor present   PT Visit Diagnosis: Unsteadiness on feet (R26.81);Other abnormalities of gait and mobility (R26.89);Repeated falls (R29.6);Muscle weakness (generalized) (M62.81);History of falling (Z91.81);Difficulty in walking, not elsewhere classified (R26.2);Other symptoms and signs involving the nervous system (R29.898);Pain Pain - part of body:  (neck, hands, wrist Lt)    Time: 9086-8998 PT Time Calculation (min) (ACUTE ONLY): 48 min   Charges:   PT Evaluation $PT Eval Low Complexity: 1 Low PT Treatments $Gait Training: 8-22 mins $Therapeutic Activity: 8-22 mins PT General Charges $$ ACUTE PT VISIT: 1 Visit         Leontine Roads, PT, DPT [facility]  Re[facility] Services Physical Therapist Office: [phone] Website: [email or web address]   Leontine GORMAN Roads 12/27/2023, 10:57 AM

## 2023-12-27 NOTE — Progress Notes (Unsigned)
Enrolled for [employer] to mail a ZIO AT Live Telemetry monitor to patients address on file.  Dr. Crenshaw to read.  

## 2023-12-27 NOTE — Progress Notes (Signed)
 Rounding Note   Patient Name: Donald [name] Date of Encounter: 12/27/2023  [facility] Cardiologist: Redell Shallow, MD   Subjective Feeling well.  Notes that his BP has been in the 90s-110s systolic for years.  Denies any prodromal symptoms prior to his syncopal event.   Scheduled Meds:  amiodarone   100 mg Oral Daily   apixaban   5 mg Oral BID   gabapentin  300 mg Oral TID   levothyroxine   75 mcg Oral Q0600   pravastatin   40 mg Oral QPM   sodium chloride  flush  3 mL Intravenous Q12H   Continuous Infusions:  PRN Meds: acetaminophen  **OR** acetaminophen , bisacodyl , HYDROcodone -acetaminophen , prochlorperazine, senna-docusate   Vital Signs  Vitals:   12/27/23 0010 12/27/23 0554 12/27/23 0602 12/27/23 0742  BP: 100/84  92/70 (!) 106/44  Pulse: 79 69 80 100  Resp: 16 18 15 19   Temp: 98 F (36.7 C)  97.7 F (36.5 C) (!) 97 F (36.1 C)  TempSrc: Oral  Oral Oral  SpO2: 96% 96%  95%  Weight:  74.8 kg    Height:        Intake/Output Summary (Last 24 hours) at 12/27/2023 1142 Last data filed at 12/27/2023 0849 Gross per 24 hour  Intake 3 ml  Output --  Net 3 ml      12/27/2023    5:54 AM 12/25/2023   10:27 PM 10/27/2023    8:39 AM  Last 3 Weights  Weight (lbs) 164 lb 14.5 oz 150 lb 161 lb 9.6 oz  Weight (kg) 74.8 kg 68.04 kg 73.301 kg      Telemetry Atrial flutter.  Rate 70s-110x  - Personally Reviewed  ECG  Atrial flutter with variable ventricular conduction.  Rate 57 bpm. RBBB.  - Personally Reviewed  Physical Exam  VS:  BP 104/73 (BP Location: Right Arm)   Pulse 99   Temp 97.8 F (36.6 C) (Oral)   Resp 20   Ht 5' 9 (1.753 m)   Wt 74.8 kg   SpO2 98%   BMI 24.35 kg/m  , BMI Body mass index is 24.35 kg/m. GENERAL:  Well appearing HEENT: Facial ecchymosis NECK:  No jugular venous distention, waveform within normal limits, carotid upstroke brisk and symmetric, no bruits, no thyromegaly LUNGS:  Clear to auscultation bilaterally HEART:   Irregularly irregular.  PMI not displaced or sustained,S1 and S2 within normal limits, no S3, no S4, no clicks, no rubs, no murmurs ABD:  Flat, positive bowel sounds normal in frequency in pitch, no bruits, no rebound, no guarding, no midline pulsatile mass, no hepatomegaly, no splenomegaly EXT:  2 plus pulses throughout, no edema, no cyanosis no clubbing SKIN:  No rashes no nodules NEURO:  Cranial nerves II through XII grossly intact, motor grossly intact throughout PSYCH:  Cognitively intact, oriented to person place and time   Labs High Sensitivity Troponin:   Recent Labs  Lab 12/25/23 2220 12/26/23 0032  TROPONINIHS 12 13     Chemistry Recent Labs  Lab 12/25/23 2220 12/25/23 2222 12/26/23 0514 12/27/23 0338  NA 138 139 138 136  K 3.7 3.7 3.8 3.6  CL 100 99 102 104  CO2 27  --  25 27  GLUCOSE 109* 110* 125* 112*  BUN 29* 30* 26* 19  CREATININE 1.14 1.30* 1.01 0.93  CALCIUM 9.2  --  8.7* 8.6*  PROT  --   --   --  5.7*  ALBUMIN  --   --   --  2.8*  AST  --   --   --  17  ALT  --   --   --  13  ALKPHOS  --   --   --  51  BILITOT  --   --   --  0.5  GFRNONAA 59*  --  >60 >60  ANIONGAP 11  --  11 5    Lipids No results for input(s): CHOL, TRIG, HDL, LABVLDL, LDLCALC, CHOLHDL in the last 168 hours.  Hematology Recent Labs  Lab 12/25/23 2220 12/25/23 2222 12/26/23 0514 12/27/23 0338  WBC 8.0  --  9.1 7.3  RBC 4.23  --  3.92* 3.88*  HGB 13.6 14.6 12.4* 12.5*  HCT 41.4 43.0 37.8* 37.6*  MCV 97.9  --  96.4 96.9  MCH 32.2  --  31.6 32.2  MCHC 32.9  --  32.8 33.2  RDW 13.6  --  13.6 13.9  PLT 257  --  254 233   Thyroid  No results for input(s): TSH, FREET4 in the last 168 hours.  BNPNo results for input(s): BNP, PROBNP in the last 168 hours.  DDimer No results for input(s): DDIMER in the last 168 hours.   Radiology  MR Cervical Spine Wo Contrast Result Date: 12/26/2023 CLINICAL DATA:  Initial evaluation for acute neck trauma. EXAM: MRI  CERVICAL SPINE WITHOUT CONTRAST TECHNIQUE: Multiplanar, multisequence MR imaging of the cervical spine was performed. No intravenous contrast was administered. COMPARISON:  CT from 12/25/2023. FINDINGS: Alignment: Reversal of the normal cervical lordosis, apex at C5-6. 2 mm degenerative anterolisthesis of C7 on T1. Vertebrae: Acute nondisplaced fracture involving the anterior aspect of the inferior endplate of C6 again seen, stable. Vertebral body height maintained. No other acute fracture elsewhere within the cervical spine. Underlying bone marrow signal intensity within normal limits. No worrisome osseous lesions. No other abnormal marrow edema. Cord: Normal signal and morphology. No evidence for traumatic cord injury. Posterior Fossa, vertebral arteries, paraspinal tissues: Visualized brain and posterior fossa within normal limits. Craniocervical junction normal. Prevertebral edema seen at C6-7 through T1-2, consistent with the acute trauma. There is presumed injury if not frank destruction of the anterior longitudinal ligament at the level of the C6 fracture. No other evidence for ligamentous injury. Normal flow voids seen within the vertebral arteries bilaterally. Disc levels: C2-C3: Small central disc protrusion mildly indents the ventral thecal sac. Mild disc bulge. Chronic endplate Schmorl's node deformity at the inferior endplate of C2. Mild facet spurring. No spinal stenosis. Foramina remain patent. C3-C4: Degenerate intervertebral disc space narrowing with diffuse disc osteophyte complex. Flattening and effacement of the ventral thecal sac. Superimposed left greater than right mild facet and ligament flavum hypertrophy. Resultant severe spinal stenosis with the thecal sac measuring 6 mm in AP diameter. Secondary cord flattening but no visible cord signal changes. Moderate left C4 foraminal stenosis. Right neural foramina remains patent. C4-C5: Degenerative disc spacing with diffuse disc osteophyte  complex. Flattening and partial effacement of the ventral thecal sac. Mild spinal stenosis. Mild right C5 foraminal narrowing. Left neural foramen remains patent. C5-C6: Degenerate intervertebral disc space narrowing with circumferential disc osteophyte complex. Posterior component effaces the ventral thecal sac. Secondary cord flattening without cord signal changes. Severe spinal stenosis with the thecal sac measuring 6 mm in AP diameter. Severe bilateral C6 foraminal stenosis. C6-C7: Degenerative intervertebral disc space narrowing with diffuse disc osteophyte complex. Flattening of the ventral thecal sac with resultant mild spinal stenosis. Severe left with moderate to severe right C7 foraminal narrowing. C7-T1: Anterolisthesis.  Disc desiccation without significant disc bulge. Mild-to-moderate bilateral facet hypertrophy. No spinal stenosis. Foramina remain patent. IMPRESSION: 1. Acute nondisplaced fracture involving the anterior aspect of the inferior endplate of C6, stable. Associated prevertebral edema at C6-7 through T1-2. Presumed injury if not frank disruption of the anterior longitudinal ligament at the level of the C6 fracture. No other evidence for ligamentous injury. 2. No other acute traumatic injury within the cervical spine or spinal cord. 3. Advanced multilevel cervical spondylosis with resultant severe spinal stenosis at C3-4 and C5-6. Secondary cord flattening at these levels without cord signal changes. 4. Multifactorial degenerative changes with resultant multilevel foraminal narrowing as above. Notable findings include moderate left C4 foraminal stenosis, severe bilateral C6 foraminal narrowing, with severe left and moderate to severe right C7 foraminal stenosis. Electronically Signed   By: Morene Hoard M.D.   On: 12/26/2023 02:08   CT Head Wo Contrast Result Date: 12/25/2023 EXAM: CT HEAD AND CERVICAL SPINE 12/25/2023 10:42:32 PM TECHNIQUE: CT of the head and cervical spine was  performed without the administration of intravenous contrast. Multiplanar reformatted images are provided for review. Automated exposure control, iterative reconstruction, and/or weight based adjustment of the mA/kV was utilized to reduce the radiation dose to as low as reasonably achievable. COMPARISON: CT head made 12:29 and CT cervical spine 07/24/2017. CLINICAL HISTORY: Polytrauma, blunt. Syncope, fall and hit head, on thinners, hematoma and laceration to forehead. FINDINGS: CT HEAD BRAIN AND VENTRICLES: No acute intracranial hemorrhage. No mass effect or midline shift. No abnormal extra-axial fluid collection. 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 No hydrocephalus. ORBITS: No acute abnormality. SINUSES AND MASTOIDS: Mucosal thickening and frothy debris in the ethmoid air cells and maxillary and sphenoid sinuses. SOFT TISSUES AND SKULL: Central forehead hematoma. No calvarial fracture. Question nondisplaced bilateral nasal bone fractures. CT CERVICAL SPINE BONES AND ALIGNMENT: Acute nondisplaced fracture through the anterior inferior C6 vertebral body. No traumatic malalignment. DEGENERATIVE CHANGES: Multilevel spondylosis, disc space height loss, and degenerative endplate changes greatest at C5-C6 and C6-C7 where they are advanced. Posterior disc osteophyte complex at C5-C6 causes mild-to-moderate effacement of the ventral thecal sac. SOFT TISSUES: No prevertebral soft tissue swelling. IMPRESSION: 1. No acute intracranial abnormality. 2. Acute nondisplaced oblique fracture through the anterior inferior C6 vertebral body. 3. Question nondisplaced bilateral nasal bone fractures. 4. Central forehead hematoma. No calvarial fracture. Electronically signed by: Norman Gatlin MD 12/25/2023 11:06 PM EDT RP Workstation: [id number]   CT Cervical Spine Wo Contrast Result Date: 12/25/2023 EXAM: CT HEAD AND CERVICAL SPINE 12/25/2023 10:42:32 PM TECHNIQUE: CT of the head and cervical spine was performed  without the administration of intravenous contrast. Multiplanar reformatted images are provided for review. Automated exposure control, iterative reconstruction, and/or weight based adjustment of the mA/kV was utilized to reduce the radiation dose to as low as reasonably achievable. COMPARISON: CT head made 12:29 and CT cervical spine 07/24/2017. CLINICAL HISTORY: Polytrauma, blunt. Syncope, fall and hit head, on thinners, hematoma and laceration to forehead. FINDINGS: CT HEAD BRAIN AND VENTRICLES: No acute intracranial hemorrhage. No mass effect or midline shift. No abnormal extra-axial fluid collection. 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 No hydrocephalus. ORBITS: No acute abnormality. SINUSES AND MASTOIDS: Mucosal thickening and frothy debris in the ethmoid air cells and maxillary and sphenoid sinuses. SOFT TISSUES AND SKULL: Central forehead hematoma. No calvarial fracture. Question nondisplaced bilateral nasal bone fractures. CT CERVICAL SPINE BONES AND ALIGNMENT: Acute nondisplaced fracture through the anterior inferior C6 vertebral body. No traumatic malalignment. DEGENERATIVE  CHANGES: Multilevel spondylosis, disc space height loss, and degenerative endplate changes greatest at C5-C6 and C6-C7 where they are advanced. Posterior disc osteophyte complex at C5-C6 causes mild-to-moderate effacement of the ventral thecal sac. SOFT TISSUES: No prevertebral soft tissue swelling. IMPRESSION: 1. No acute intracranial abnormality. 2. Acute nondisplaced oblique fracture through the anterior inferior C6 vertebral body. 3. Question nondisplaced bilateral nasal bone fractures. 4. Central forehead hematoma. No calvarial fracture. Electronically signed by: Norman Gatlin MD 12/25/2023 11:06 PM EDT RP Workstation: [id number]   DG Chest Portable 1 View Result Date: 12/25/2023 CLINICAL DATA:  Syncope. EXAM: PORTABLE CHEST 1 VIEW COMPARISON:  October 27, 2023 FINDINGS: Multiple sternal wires and vascular clips  are seen. The heart size and mediastinal contours are within normal limits. There is marked severity calcification of the aortic arch. Mild atelectasis is seen within the left lung base. No pleural effusion or pneumothorax is identified. Multilevel degenerative changes are present throughout the thoracic spine. IMPRESSION: 1. Evidence of prior median sternotomy/CABG. 2. Mild left basilar atelectasis. Electronically Signed   By: Suzen Dials M.D.   On: 12/25/2023 22:56   DG Hand Complete Right Result Date: 12/25/2023 CLINICAL DATA:  Syncope. EXAM: RIGHT HAND - COMPLETE 3+ VIEW COMPARISON:  None Available. FINDINGS: There is no evidence of an acute fracture or dislocation. Mild chronic changes are seen involving the proximal portions of the fourth and fifth right metacarpals. Marked severity degenerative changes are present involving the first carpometacarpal and adjacent portions of the right triquetrum bone and base of the first right metacarpal. Additional degenerative changes are seen involving numerous interphalangeal joints. Soft tissues are unremarkable. IMPRESSION: 1. No acute fracture or dislocation. 2. Marked severity degenerative changes, as described above. Electronically Signed   By: Suzen Dials M.D.   On: 12/25/2023 22:54   DG Hand Complete Left Result Date: 12/25/2023 CLINICAL DATA:  Syncope. EXAM: LEFT HAND - COMPLETE 3+ VIEW COMPARISON:  None Available. FINDINGS: An ill-defined, curvilinear cortical lucency of indeterminate age is seen involving the base of the first left metacarpal. This is of indeterminate age. Marked severity chronic and degenerative changes are seen involving the left trapezium bone and first carpometacarpal joint. Additional degenerative changes are noted throughout multiple interphalangeal joints. Soft tissues are unremarkable. IMPRESSION: 1. Findings which may represent a nondisplaced fracture of indeterminate age involving the base of the first left  metacarpal. Correlation with physical examination is recommended to determine the presence of point tenderness. 2. Marked severity chronic and degenerative changes involving the left trapezium bone and first carpometacarpal joint. Electronically Signed   By: Suzen Dials M.D.   On: 12/25/2023 22:50    Cardiac Studies  Echo 12/06/23:  1. Akinesis of the distal septum and apex with overall mild to moderate  LV dysfunction; trileaflet aortic valve with fixed noncoronary cusp with  likely mild AS (mean gradient 7.5 mmHg, DI 0.38, AVA 1.4 cm2); swirling  noted at LV apex but no definite  thrombus.   2. Left ventricular ejection fraction, by estimation, is 40 to 45%. The  left ventricle has mildly decreased function. The left ventricle  demonstrates regional wall motion abnormalities (see scoring  diagram/findings for description). Left ventricular  diastolic parameters are consistent with Grade I diastolic dysfunction  (impaired relaxation).   3. Right ventricular systolic function is normal. The right ventricular  size is mildly enlarged.   4. Left atrial size was severely dilated.   5. The mitral valve is normal in structure. Mild mitral valve  regurgitation. No  evidence of mitral stenosis.   6. Tricuspid valve regurgitation is moderate to severe.   7. The aortic valve is normal in structure. Aortic valve regurgitation is  trivial. No aortic stenosis is present.   8. The inferior vena cava is normal in size with greater than 50%  respiratory variability, suggesting right atrial pressure of 3 mmHg.   Patient Profile   89 y.o. male with PAF, CAD s/p CABG, ischemic cardiomyopathy, prior CVA, hypothyroidism, HFmrEF,  and prostate cancer admitted with syncope, fall and head injury.  Cardiology consulted for atrial fibrillation.  Assessment & Plan   # Syncope:  Unclear what caused this episode.  He was at a concert and had a syncopal episode while walking at [facility]. No prolonged pauses on  telemetry.  There is some question of whether he had a post-termination pause.  Outpatient monitoring recommended at discharge.  History of orthostatic hypotension.  However, he was not orthostatic this admission.  His episode doesn't seem consistent with orthstasis. Encouraged him to liberalize sodium intake.  Would consider midodrine, but I'm not convinced that hypotension alone caused his syncope.  Amiodarone  dose previously decreased due to dizziness and tremors.    # Atrial flutter: Previously on amiodarone  and dose was reduced as above.  Avoiding other nodal agents due to orthostasis and low blood pressures.  He underwent cardioversion for atrial flutter 03/2022.  Noted to be in recurrent atrial flutter this admission.  There is no discussion about risk-benefit analysis with anticoagulation.  CHA2DS2-VASc is 6.  However now with 2 syncopal episodes and falls he is at high bleeding risk.  Consider watchman evaluation as an outpatient.  He is 89 but otherwise very functional.  This was discussed between Dr. Pietro and the patient and the patient declined invasive procedures.  Confirmed with patient and his daughter that we will hold Eliquis .   # HFmrEF:  He is euvolemic.  Unable to be on GDMT for reasons listed above.   # CAD s/p CABG:  # Hyperlipidemia:  Not an active issue.  No ischemic symptoms.  Consider adding aspirin  at follow up.   # Non-displaced C6 fracture:  Neurosurgery recommends C-collar and follow up outpatient.   [facility] will sign off.   Medication Recommendations:  hold Eliquis .  Consider aspirin  for CAD at follow up.  Other recommendations (labs, testing, etc):  outpatient [facility] Follow up as an outpatient:  we will arrange     For questions or updates, please contact [facility] Please consult [email or web address] for contact info under       Signed, Annabella Scarce, MD  12/27/2023, 11:42 AM

## 2023-12-29 DIAGNOSIS — H353132 Nonexudative age-related macular degeneration, bilateral, intermediate dry stage: Secondary | ICD-10-CM | POA: Diagnosis not present

## 2023-12-29 DIAGNOSIS — Z961 Presence of intraocular lens: Secondary | ICD-10-CM | POA: Diagnosis not present

## 2023-12-29 DIAGNOSIS — H52203 Unspecified astigmatism, bilateral: Secondary | ICD-10-CM | POA: Diagnosis not present

## 2023-12-29 DIAGNOSIS — H401123 Primary open-angle glaucoma, left eye, severe stage: Secondary | ICD-10-CM | POA: Diagnosis not present

## 2023-12-30 ENCOUNTER — Telehealth: Payer: Self-pay | Admitting: Cardiology

## 2023-12-30 DIAGNOSIS — R55 Syncope and collapse: Secondary | ICD-10-CM | POA: Diagnosis not present

## 2023-12-30 DIAGNOSIS — M6281 Muscle weakness (generalized): Secondary | ICD-10-CM | POA: Diagnosis not present

## 2023-12-30 DIAGNOSIS — R2681 Unsteadiness on feet: Secondary | ICD-10-CM | POA: Diagnosis not present

## 2023-12-30 NOTE — Telephone Encounter (Signed)
 Pt c/o medication issue:  1. Name of Medication:  gabapentin (NEURONTIN) 300 MG capsule     2. How are you currently taking this medication (dosage and times per day)?   3. Are you having a reaction (difficulty breathing--STAT)?   4. What is your medication issue?   Patient was prescribed gabapentin in the hospital. He says it causes dizziness and he doesn't think it is needed. He would like to know if Dr. Pietro thinks he should take it. Please advise.

## 2023-12-30 NOTE — Telephone Encounter (Signed)
Please advise if you would like patient to continue.

## 2023-12-30 NOTE — Telephone Encounter (Signed)
 Spoke with pt, Aware of dr vertie recommendations.

## 2024-01-03 DIAGNOSIS — R55 Syncope and collapse: Secondary | ICD-10-CM | POA: Diagnosis not present

## 2024-01-03 DIAGNOSIS — M1812 Unilateral primary osteoarthritis of first carpometacarpal joint, left hand: Secondary | ICD-10-CM | POA: Diagnosis not present

## 2024-01-03 DIAGNOSIS — R2681 Unsteadiness on feet: Secondary | ICD-10-CM | POA: Diagnosis not present

## 2024-01-03 DIAGNOSIS — M6281 Muscle weakness (generalized): Secondary | ICD-10-CM | POA: Diagnosis not present

## 2024-01-04 DIAGNOSIS — N1831 Chronic kidney disease, stage 3a: Secondary | ICD-10-CM | POA: Diagnosis not present

## 2024-01-04 DIAGNOSIS — M6281 Muscle weakness (generalized): Secondary | ICD-10-CM | POA: Diagnosis not present

## 2024-01-04 DIAGNOSIS — R2681 Unsteadiness on feet: Secondary | ICD-10-CM | POA: Diagnosis not present

## 2024-01-04 DIAGNOSIS — S022XXA Fracture of nasal bones, initial encounter for closed fracture: Secondary | ICD-10-CM | POA: Diagnosis not present

## 2024-01-04 DIAGNOSIS — K6289 Other specified diseases of anus and rectum: Secondary | ICD-10-CM | POA: Diagnosis not present

## 2024-01-04 DIAGNOSIS — R55 Syncope and collapse: Secondary | ICD-10-CM | POA: Diagnosis not present

## 2024-01-04 DIAGNOSIS — S62202A Unspecified fracture of first metacarpal bone, left hand, initial encounter for closed fracture: Secondary | ICD-10-CM | POA: Diagnosis not present

## 2024-01-04 DIAGNOSIS — I130 Hypertensive heart and chronic kidney disease with heart failure and stage 1 through stage 4 chronic kidney disease, or unspecified chronic kidney disease: Secondary | ICD-10-CM | POA: Diagnosis not present

## 2024-01-04 DIAGNOSIS — M810 Age-related osteoporosis without current pathological fracture: Secondary | ICD-10-CM | POA: Diagnosis not present

## 2024-01-04 DIAGNOSIS — T148XXA Other injury of unspecified body region, initial encounter: Secondary | ICD-10-CM | POA: Diagnosis not present

## 2024-01-04 DIAGNOSIS — S12501A Unspecified nondisplaced fracture of sixth cervical vertebra, initial encounter for closed fracture: Secondary | ICD-10-CM | POA: Diagnosis not present

## 2024-01-04 DIAGNOSIS — I480 Paroxysmal atrial fibrillation: Secondary | ICD-10-CM | POA: Diagnosis not present

## 2024-01-05 DIAGNOSIS — R2681 Unsteadiness on feet: Secondary | ICD-10-CM | POA: Diagnosis not present

## 2024-01-05 DIAGNOSIS — M6281 Muscle weakness (generalized): Secondary | ICD-10-CM | POA: Diagnosis not present

## 2024-01-05 DIAGNOSIS — R55 Syncope and collapse: Secondary | ICD-10-CM | POA: Diagnosis not present

## 2024-01-06 DIAGNOSIS — R55 Syncope and collapse: Secondary | ICD-10-CM | POA: Diagnosis not present

## 2024-01-06 DIAGNOSIS — R2681 Unsteadiness on feet: Secondary | ICD-10-CM | POA: Diagnosis not present

## 2024-01-06 DIAGNOSIS — M6281 Muscle weakness (generalized): Secondary | ICD-10-CM | POA: Diagnosis not present

## 2024-01-07 DIAGNOSIS — M47812 Spondylosis without myelopathy or radiculopathy, cervical region: Secondary | ICD-10-CM | POA: Diagnosis not present

## 2024-01-09 ENCOUNTER — Encounter (HOSPITAL_COMMUNITY): Admission: EM | Disposition: A | Payer: Self-pay | Attending: Surgery

## 2024-01-09 ENCOUNTER — Emergency Department (HOSPITAL_COMMUNITY): Admitting: Anesthesiology

## 2024-01-09 ENCOUNTER — Inpatient Hospital Stay (HOSPITAL_COMMUNITY)
Admission: EM | Admit: 2024-01-09 | Discharge: 2024-01-10 | DRG: 253 | Disposition: A | Discharge status: Home / Self Care | Attending: Surgery | Admitting: Surgery

## 2024-01-09 ENCOUNTER — Other Ambulatory Visit: Payer: Self-pay

## 2024-01-09 ENCOUNTER — Encounter (HOSPITAL_COMMUNITY): Payer: Self-pay

## 2024-01-09 ENCOUNTER — Emergency Department (HOSPITAL_COMMUNITY)

## 2024-01-09 DIAGNOSIS — R936 Abnormal findings on diagnostic imaging of limbs: Secondary | ICD-10-CM

## 2024-01-09 DIAGNOSIS — I5022 Chronic systolic (congestive) heart failure: Secondary | ICD-10-CM | POA: Diagnosis present

## 2024-01-09 DIAGNOSIS — I5041 Acute combined systolic (congestive) and diastolic (congestive) heart failure: Secondary | ICD-10-CM

## 2024-01-09 DIAGNOSIS — S0003XA Contusion of scalp, initial encounter: Secondary | ICD-10-CM | POA: Diagnosis not present

## 2024-01-09 DIAGNOSIS — E785 Hyperlipidemia, unspecified: Secondary | ICD-10-CM | POA: Diagnosis present

## 2024-01-09 DIAGNOSIS — Z951 Presence of aortocoronary bypass graft: Secondary | ICD-10-CM

## 2024-01-09 DIAGNOSIS — I110 Hypertensive heart disease with heart failure: Secondary | ICD-10-CM

## 2024-01-09 DIAGNOSIS — Z923 Personal history of irradiation: Secondary | ICD-10-CM

## 2024-01-09 DIAGNOSIS — I2510 Atherosclerotic heart disease of native coronary artery without angina pectoris: Secondary | ICD-10-CM | POA: Diagnosis not present

## 2024-01-09 DIAGNOSIS — Z96651 Presence of right artificial knee joint: Secondary | ICD-10-CM | POA: Diagnosis present

## 2024-01-09 DIAGNOSIS — J45909 Unspecified asthma, uncomplicated: Secondary | ICD-10-CM | POA: Diagnosis present

## 2024-01-09 DIAGNOSIS — R200 Anesthesia of skin: Secondary | ICD-10-CM | POA: Diagnosis not present

## 2024-01-09 DIAGNOSIS — Z8673 Personal history of transient ischemic attack (TIA), and cerebral infarction without residual deficits: Secondary | ICD-10-CM

## 2024-01-09 DIAGNOSIS — Z9181 History of falling: Secondary | ICD-10-CM

## 2024-01-09 DIAGNOSIS — I951 Orthostatic hypotension: Secondary | ICD-10-CM | POA: Diagnosis present

## 2024-01-09 DIAGNOSIS — I5020 Unspecified systolic (congestive) heart failure: Secondary | ICD-10-CM | POA: Diagnosis not present

## 2024-01-09 DIAGNOSIS — R208 Other disturbances of skin sensation: Secondary | ICD-10-CM

## 2024-01-09 DIAGNOSIS — Z7982 Long term (current) use of aspirin: Secondary | ICD-10-CM | POA: Diagnosis not present

## 2024-01-09 DIAGNOSIS — Z79899 Other long term (current) drug therapy: Secondary | ICD-10-CM | POA: Diagnosis not present

## 2024-01-09 DIAGNOSIS — I255 Ischemic cardiomyopathy: Secondary | ICD-10-CM | POA: Diagnosis present

## 2024-01-09 DIAGNOSIS — I70213 Atherosclerosis of native arteries of extremities with intermittent claudication, bilateral legs: Secondary | ICD-10-CM | POA: Diagnosis not present

## 2024-01-09 DIAGNOSIS — Z8546 Personal history of malignant neoplasm of prostate: Secondary | ICD-10-CM | POA: Diagnosis not present

## 2024-01-09 DIAGNOSIS — Z86718 Personal history of other venous thrombosis and embolism: Secondary | ICD-10-CM

## 2024-01-09 DIAGNOSIS — I743 Embolism and thrombosis of arteries of the lower extremities: Secondary | ICD-10-CM | POA: Diagnosis not present

## 2024-01-09 DIAGNOSIS — I4891 Unspecified atrial fibrillation: Secondary | ICD-10-CM | POA: Diagnosis not present

## 2024-01-09 DIAGNOSIS — G9389 Other specified disorders of brain: Secondary | ICD-10-CM | POA: Diagnosis not present

## 2024-01-09 DIAGNOSIS — Z79890 Hormone replacement therapy (postmenopausal): Secondary | ICD-10-CM

## 2024-01-09 DIAGNOSIS — M79604 Pain in right leg: Secondary | ICD-10-CM | POA: Diagnosis not present

## 2024-01-09 DIAGNOSIS — S8991XA Unspecified injury of right lower leg, initial encounter: Secondary | ICD-10-CM | POA: Diagnosis not present

## 2024-01-09 DIAGNOSIS — Z955 Presence of coronary angioplasty implant and graft: Secondary | ICD-10-CM | POA: Diagnosis not present

## 2024-01-09 DIAGNOSIS — E039 Hypothyroidism, unspecified: Secondary | ICD-10-CM | POA: Diagnosis present

## 2024-01-09 DIAGNOSIS — I480 Paroxysmal atrial fibrillation: Secondary | ICD-10-CM | POA: Diagnosis present

## 2024-01-09 DIAGNOSIS — I998 Other disorder of circulatory system: Secondary | ICD-10-CM | POA: Diagnosis not present

## 2024-01-09 DIAGNOSIS — R29818 Other symptoms and signs involving the nervous system: Secondary | ICD-10-CM | POA: Diagnosis not present

## 2024-01-09 DIAGNOSIS — K627 Radiation proctitis: Secondary | ICD-10-CM | POA: Diagnosis not present

## 2024-01-09 DIAGNOSIS — R55 Syncope and collapse: Secondary | ICD-10-CM | POA: Diagnosis not present

## 2024-01-09 DIAGNOSIS — Z9889 Other specified postprocedural states: Secondary | ICD-10-CM | POA: Diagnosis not present

## 2024-01-09 DIAGNOSIS — Z8249 Family history of ischemic heart disease and other diseases of the circulatory system: Secondary | ICD-10-CM

## 2024-01-09 DIAGNOSIS — I70201 Unspecified atherosclerosis of native arteries of extremities, right leg: Secondary | ICD-10-CM | POA: Diagnosis not present

## 2024-01-09 DIAGNOSIS — I70221 Atherosclerosis of native arteries of extremities with rest pain, right leg: Secondary | ICD-10-CM | POA: Diagnosis present

## 2024-01-09 DIAGNOSIS — Z7983 Long term (current) use of bisphosphonates: Secondary | ICD-10-CM | POA: Diagnosis not present

## 2024-01-09 DIAGNOSIS — I071 Rheumatic tricuspid insufficiency: Secondary | ICD-10-CM | POA: Diagnosis not present

## 2024-01-09 DIAGNOSIS — I70238 Atherosclerosis of native arteries of right leg with ulceration of other part of lower right leg: Principal | ICD-10-CM

## 2024-01-09 LAB — BASIC METABOLIC PANEL WITH GFR
Anion gap: 16 — ABNORMAL HIGH (ref 5–15)
BUN: 19 mg/dL (ref 8–23)
CO2: 29 mmol/L (ref 22–32)
Calcium: 8.9 mg/dL (ref 8.9–10.3)
Chloride: 95 mmol/L — ABNORMAL LOW (ref 98–111)
Creatinine, Ser: 0.93 mg/dL (ref 0.61–1.24)
GFR, Estimated: >60 mL/min (ref >60)
Glucose, Bld: 140 mg/dL — ABNORMAL HIGH (ref 70–99)
Potassium: 3.4 mmol/L — ABNORMAL LOW (ref 3.5–5.1)
Sodium: 140 mmol/L (ref 135–145)

## 2024-01-09 LAB — CBC WITH DIFFERENTIAL/PLATELET
Abs Immature Granulocytes: 0.03 K/uL (ref 0.00–0.07)
Basophils Absolute: 0 K/uL (ref 0.0–0.1)
Basophils Relative: 1 %
Eosinophils Absolute: 0.1 K/uL (ref 0.0–0.5)
Eosinophils Relative: 1 %
HCT: 39.9 % (ref 39.0–52.0)
Hemoglobin: 12.9 g/dL — ABNORMAL LOW (ref 13.0–17.0)
Immature Granulocytes: 0 %
Lymphocytes Relative: 11 %
Lymphs Abs: 0.8 K/uL (ref 0.7–4.0)
MCH: 31.8 pg (ref 26.0–34.0)
MCHC: 32.3 g/dL (ref 30.0–36.0)
MCV: 98.3 fL (ref 80.0–100.0)
Monocytes Absolute: 0.4 K/uL (ref 0.1–1.0)
Monocytes Relative: 5 %
Neutro Abs: 6.2 K/uL (ref 1.7–7.7)
Neutrophils Relative %: 82 %
Platelets: 263 K/uL (ref 150–400)
RBC: 4.06 MIL/uL — ABNORMAL LOW (ref 4.22–5.81)
RDW: 13.6 % (ref 11.5–15.5)
WBC: 7.5 K/uL (ref 4.0–10.5)
nRBC: 0 % (ref 0.0–0.2)

## 2024-01-09 LAB — SURGICAL PCR SCREEN
MRSA, PCR: NEGATIVE
Staphylococcus aureus: NEGATIVE

## 2024-01-09 LAB — TYPE AND SCREEN
ABO/RH(D): A POS
Antibody Screen: NEGATIVE

## 2024-01-09 LAB — PROTIME-INR
INR: 1 (ref 0.8–1.2)
Prothrombin Time: 14.1 s (ref 11.4–15.2)

## 2024-01-09 SURGERY — THROMBECTOMY, ARTERY, FEMORAL
Anesthesia: General | Site: Groin | Laterality: Right

## 2024-01-09 MED ORDER — FENTANYL CITRATE (PF) 100 MCG/2ML IJ SOLN: 25.0000 ug | INTRAMUSCULAR | Status: DC | PRN

## 2024-01-09 MED ORDER — SENNOSIDES-DOCUSATE SODIUM 8.6-50 MG PO TABS: 1.0000 | ORAL_TABLET | Freq: Every evening | ORAL | Status: DC | PRN

## 2024-01-09 MED ORDER — OXYCODONE HCL 5 MG PO TABS: 5.0000 mg | ORAL_TABLET | Freq: Once | ORAL | Status: DC | PRN

## 2024-01-09 MED ORDER — HEPARIN (PORCINE) 25000 UT/250ML-% IV SOLN
1100.0000 [IU]/h | INTRAVENOUS | Status: DC
  Administered 2024-01-09: 1100 [IU]/h via INTRAVENOUS
  Filled 2024-01-09: qty 250

## 2024-01-09 MED ORDER — DOCUSATE SODIUM 100 MG PO CAPS
100.0000 mg | ORAL_CAPSULE | Freq: Every day | ORAL | Status: DC
  Filled 2024-01-09: qty 1

## 2024-01-09 MED ORDER — HEPARIN SODIUM (PORCINE) 1000 UNIT/ML IJ SOLN
INTRAMUSCULAR | Status: DC | PRN
Start: 2024-01-09 — End: 2024-01-09
  Administered 2024-01-09: 5000 [IU] via INTRAVENOUS

## 2024-01-09 MED ORDER — VASOPRESSIN 20 UNIT/ML IV SOLN
INTRAVENOUS | Status: AC
  Filled 2024-01-09: qty 1

## 2024-01-09 MED ORDER — LACTATED RINGERS IV SOLN
INTRAVENOUS | Status: DC
Start: 2024-01-09 — End: 2024-01-09

## 2024-01-09 MED ORDER — HEPARIN 6000 UNIT IRRIGATION SOLUTION
Status: DC | PRN
  Administered 2024-01-09: 1

## 2024-01-09 MED ORDER — LACTATED RINGERS IV SOLN: INTRAVENOUS | Status: DC | PRN

## 2024-01-09 MED ORDER — HEPARIN BOLUS VIA INFUSION
3000.0000 [IU] | Freq: Once | INTRAVENOUS | Status: AC
  Administered 2024-01-09: 3000 [IU] via INTRAVENOUS
  Filled 2024-01-09: qty 3000

## 2024-01-09 MED ORDER — OXYCODONE HCL 5 MG PO TABS: 5.0000 mg | ORAL_TABLET | ORAL | Status: DC | PRN

## 2024-01-09 MED ORDER — HYDROMORPHONE HCL 1 MG/ML IJ SOLN: 0.5000 mg | INTRAMUSCULAR | Status: DC | PRN

## 2024-01-09 MED ORDER — ASPIRIN 81 MG PO TBEC
81.0000 mg | DELAYED_RELEASE_TABLET | Freq: Every day | ORAL | Status: DC
Start: 2024-01-10 — End: 2024-01-10
  Administered 2024-01-10: 81 mg via ORAL
  Filled 2024-01-09: qty 1

## 2024-01-09 MED ORDER — OXYCODONE HCL 5 MG/5ML PO SOLN: 5.0000 mg | Freq: Once | ORAL | Status: DC | PRN

## 2024-01-09 MED ORDER — HYDRALAZINE HCL 20 MG/ML IJ SOLN: 5.0000 mg | INTRAMUSCULAR | Status: DC | PRN

## 2024-01-09 MED ORDER — MIDAZOLAM HCL 2 MG/2ML IJ SOLN: 0.5000 mg | Freq: Once | INTRAMUSCULAR | Status: DC | PRN

## 2024-01-09 MED ORDER — PHENYLEPHRINE 80 MCG/ML (10ML) SYRINGE FOR IV PUSH (FOR BLOOD PRESSURE SUPPORT)
PREFILLED_SYRINGE | INTRAVENOUS | Status: DC | PRN
  Administered 2024-01-09: 160 ug via INTRAVENOUS
  Administered 2024-01-09: 80 ug via INTRAVENOUS
  Administered 2024-01-09 (×2): 160 ug via INTRAVENOUS

## 2024-01-09 MED ORDER — IOHEXOL 350 MG/ML SOLN
100.0000 mL | Freq: Once | INTRAVENOUS | Status: AC | PRN
Start: 2024-01-09 — End: 2024-01-09
  Administered 2024-01-09: 100 mL via INTRAVENOUS

## 2024-01-09 MED ORDER — ORAL CARE MOUTH RINSE: 15.0000 mL | Freq: Once | OROMUCOSAL | Status: AC

## 2024-01-09 MED ORDER — FENTANYL CITRATE (PF) 250 MCG/5ML IJ SOLN
INTRAMUSCULAR | Status: DC | PRN
  Administered 2024-01-09: 100 ug via INTRAVENOUS

## 2024-01-09 MED ORDER — PROPOFOL 10 MG/ML IV BOLUS
INTRAVENOUS | Status: DC | PRN
  Administered 2024-01-09: 70 mg via INTRAVENOUS

## 2024-01-09 MED ORDER — EPHEDRINE SULFATE-NACL 50-0.9 MG/10ML-% IV SOSY
PREFILLED_SYRINGE | INTRAVENOUS | Status: DC | PRN
  Administered 2024-01-09 (×2): 5 mg via INTRAVENOUS

## 2024-01-09 MED ORDER — CEFAZOLIN SODIUM-DEXTROSE 2-3 GM-%(50ML) IV SOLR
INTRAVENOUS | Status: DC | PRN
Start: 2024-01-09 — End: 2024-01-09
  Administered 2024-01-09: 2 g via INTRAVENOUS

## 2024-01-09 MED ORDER — PHENOL 1.4 % MT LIQD: 1.0000 | OROMUCOSAL | Status: DC | PRN

## 2024-01-09 MED ORDER — POTASSIUM CHLORIDE CRYS ER 20 MEQ PO TBCR: 40.0000 meq | EXTENDED_RELEASE_TABLET | Freq: Every day | ORAL | Status: DC | PRN

## 2024-01-09 MED ORDER — CHLORHEXIDINE GLUCONATE 0.12 % MT SOLN
OROMUCOSAL | Status: AC
  Filled 2024-01-09: qty 15

## 2024-01-09 MED ORDER — HEPARIN 6000 UNIT IRRIGATION SOLUTION
Status: AC
  Filled 2024-01-09: qty 500

## 2024-01-09 MED ORDER — ACETAMINOPHEN 10 MG/ML IV SOLN
INTRAVENOUS | Status: DC | PRN
Start: 2024-01-09 — End: 2024-01-09
  Administered 2024-01-09: 1000 mg via INTRAVENOUS

## 2024-01-09 MED ORDER — ALBUMIN HUMAN 5 % IV SOLN: INTRAVENOUS | Status: DC | PRN

## 2024-01-09 MED ORDER — DEXAMETHASONE SOD PHOSPHATE PF 10 MG/ML IJ SOLN
INTRAMUSCULAR | Status: DC | PRN
  Administered 2024-01-09: 10 mg via INTRAVENOUS

## 2024-01-09 MED ORDER — CEFAZOLIN SODIUM-DEXTROSE 2-4 GM/100ML-% IV SOLN
2.0000 g | Freq: Three times a day (TID) | INTRAVENOUS | Status: AC
  Administered 2024-01-09 (×2): 2 g via INTRAVENOUS
  Filled 2024-01-09 (×2): qty 100

## 2024-01-09 MED ORDER — FENTANYL CITRATE (PF) 250 MCG/5ML IJ SOLN
INTRAMUSCULAR | Status: AC
  Filled 2024-01-09: qty 5

## 2024-01-09 MED ORDER — ACETAMINOPHEN 650 MG RE SUPP: 325.0000 mg | RECTAL | Status: DC | PRN

## 2024-01-09 MED ORDER — ONDANSETRON HCL 4 MG/2ML IJ SOLN
INTRAMUSCULAR | Status: DC | PRN
  Administered 2024-01-09: 4 mg via INTRAVENOUS

## 2024-01-09 MED ORDER — CHLORHEXIDINE GLUCONATE CLOTH 2 % EX PADS
6.0000 | MEDICATED_PAD | Freq: Every day | CUTANEOUS | Status: DC
  Administered 2024-01-09: 6 via TOPICAL

## 2024-01-09 MED ORDER — SODIUM CHLORIDE 0.9% FLUSH: 3.0000 mL | INTRAVENOUS | Status: DC | PRN

## 2024-01-09 MED ORDER — PROTAMINE SULFATE 10 MG/ML IV SOLN
INTRAVENOUS | Status: DC | PRN
  Administered 2024-01-09: 25 mg via INTRAVENOUS

## 2024-01-09 MED ORDER — SODIUM CHLORIDE 0.9 % IV SOLN: 250.0000 mL | INTRAVENOUS | Status: AC | PRN

## 2024-01-09 MED ORDER — HEPARIN (PORCINE) 25000 UT/250ML-% IV SOLN: 1100.0000 [IU]/h | INTRAVENOUS | Status: DC

## 2024-01-09 MED ORDER — SUGAMMADEX SODIUM 200 MG/2ML IV SOLN
INTRAVENOUS | Status: DC | PRN
  Administered 2024-01-09: 200 mg via INTRAVENOUS

## 2024-01-09 MED ORDER — BISACODYL 5 MG PO TBEC: 5.0000 mg | DELAYED_RELEASE_TABLET | Freq: Every day | ORAL | Status: DC | PRN

## 2024-01-09 MED ORDER — HEMOSTATIC AGENTS (NO CHARGE) OPTIME
TOPICAL | Status: DC | PRN
  Administered 2024-01-09: 1 via TOPICAL

## 2024-01-09 MED ORDER — PRAVASTATIN SODIUM 40 MG PO TABS
40.0000 mg | ORAL_TABLET | Freq: Every evening | ORAL | Status: DC
  Administered 2024-01-09 – 2024-01-10 (×2): 40 mg via ORAL
  Filled 2024-01-09 (×2): qty 1

## 2024-01-09 MED ORDER — LIDOCAINE 2% (20 MG/ML) 5 ML SYRINGE
INTRAMUSCULAR | Status: DC | PRN
  Administered 2024-01-09: 20 mg via INTRAVENOUS

## 2024-01-09 MED ORDER — AMIODARONE HCL 200 MG PO TABS
100.0000 mg | ORAL_TABLET | Freq: Every day | ORAL | Status: DC
  Administered 2024-01-09 – 2024-01-10 (×2): 100 mg via ORAL
  Filled 2024-01-09 (×2): qty 1

## 2024-01-09 MED ORDER — SODIUM CHLORIDE 0.9 % IV SOLN
500.0000 mL | Freq: Once | INTRAVENOUS | Status: DC | PRN
Start: 2024-01-09 — End: 2024-01-10

## 2024-01-09 MED ORDER — 0.9 % SODIUM CHLORIDE (POUR BTL) OPTIME
TOPICAL | Status: DC | PRN
Start: 2024-01-09 — End: 2024-01-09
  Administered 2024-01-09: 2000 mL

## 2024-01-09 MED ORDER — ROCURONIUM BROMIDE 10 MG/ML (PF) SYRINGE
PREFILLED_SYRINGE | INTRAVENOUS | Status: DC | PRN
  Administered 2024-01-09: 50 mg via INTRAVENOUS

## 2024-01-09 MED ORDER — MEPERIDINE HCL 25 MG/ML IJ SOLN: 6.2500 mg | INTRAMUSCULAR | Status: DC | PRN

## 2024-01-09 MED ORDER — SODIUM CHLORIDE 0.9% FLUSH
3.0000 mL | Freq: Two times a day (BID) | INTRAVENOUS | Status: DC
  Administered 2024-01-09 – 2024-01-10 (×2): 3 mL via INTRAVENOUS

## 2024-01-09 MED ORDER — PHENYLEPHRINE HCL-NACL 20-0.9 MG/250ML-% IV SOLN
INTRAVENOUS | Status: DC | PRN
  Administered 2024-01-09: 50 ug/min via INTRAVENOUS

## 2024-01-09 MED ORDER — CHLORHEXIDINE GLUCONATE 0.12 % MT SOLN
15.0000 mL | Freq: Once | OROMUCOSAL | Status: AC
  Administered 2024-01-09: 15 mL via OROMUCOSAL

## 2024-01-09 MED ORDER — LEVOTHYROXINE SODIUM 75 MCG PO TABS
75.0000 ug | ORAL_TABLET | Freq: Every day | ORAL | Status: DC
  Administered 2024-01-10: 75 ug via ORAL
  Filled 2024-01-09: qty 1

## 2024-01-09 MED ORDER — ACETAMINOPHEN 325 MG PO TABS
325.0000 mg | ORAL_TABLET | ORAL | Status: DC | PRN
  Administered 2024-01-09: 650 mg via ORAL
  Filled 2024-01-09 (×2): qty 2

## 2024-01-09 SURGICAL SUPPLY — 45 items
BAG COUNTER SPONGE SURGICOUNT (BAG) ×1 IMPLANT
CANISTER SUCTION 3000ML PPV (SUCTIONS) ×1 IMPLANT
CANNULA VESSEL 3MM 2 BLNT TIP (CANNULA) ×1 IMPLANT
CATH EMB 3FR 80 (CATHETERS) IMPLANT
CATH EMB 4FR 80 (CATHETERS) IMPLANT
CLIP TI MEDIUM 24 (CLIP) ×1 IMPLANT
CLIP TI WIDE RED SMALL 24 (CLIP) ×1 IMPLANT
CNTNR URN SCR LID CUP LEK RST (MISCELLANEOUS) IMPLANT
DERMABOND ADVANCED .7 DNX12 (GAUZE/BANDAGES/DRESSINGS) ×1 IMPLANT
DRAPE HALF SHEET 40X57 (DRAPES) IMPLANT
ELECTRODE REM PT RTRN 9FT ADLT (ELECTROSURGICAL) ×1 IMPLANT
EVACUATOR SILICONE 100CC (DRAIN) IMPLANT
GLOVE BIOGEL PI IND STRL 8 (GLOVE) ×1 IMPLANT
GLOVE SURG SS PI 7.5 STRL IVOR (GLOVE) ×1 IMPLANT
GOWN STRL REUS W/ TWL LRG LVL3 (GOWN DISPOSABLE) ×2 IMPLANT
GOWN STRL REUS W/ TWL XL LVL3 (GOWN DISPOSABLE) ×1 IMPLANT
HEMOSTAT SNOW SURGICEL 2X4 (HEMOSTASIS) IMPLANT
INSERT FOGARTY SM (MISCELLANEOUS) IMPLANT
KIT BASIN OR (CUSTOM PROCEDURE TRAY) ×1 IMPLANT
KIT TURNOVER KIT B (KITS) ×1 IMPLANT
MARKER GRAFT CORONARY BYPASS (MISCELLANEOUS) IMPLANT
PACK PERIPHERAL VASCULAR (CUSTOM PROCEDURE TRAY) ×1 IMPLANT
PAD ARMBOARD POSITIONER FOAM (MISCELLANEOUS) ×2 IMPLANT
SET COLLECT BLD 21X3/4 12 (NEEDLE) IMPLANT
SOLN 0.9% NACL 1000 ML (IV SOLUTION) ×2 IMPLANT
SOLN 0.9% NACL POUR BTL 1000ML (IV SOLUTION) ×2 IMPLANT
SOLN STERILE WATER 1000 ML (IV SOLUTION) ×1 IMPLANT
SOLN STERILE WATER BTL 1000 ML (IV SOLUTION) ×1 IMPLANT
STOPCOCK 4 WAY LG BORE MALE ST (IV SETS) IMPLANT
SURGIFLO W/THROMBIN 8M KIT (HEMOSTASIS) IMPLANT
SUT ETHILON 3 0 PS 1 (SUTURE) IMPLANT
SUT GORETEX 6.0 TT9 (SUTURE) IMPLANT
SUT MNCRL AB 4-0 PS2 18 (SUTURE) IMPLANT
SUT PROLENE 5 0 C 1 24 (SUTURE) ×1 IMPLANT
SUT PROLENE 6 0 BV (SUTURE) ×1 IMPLANT
SUT PROLENE 7 0 BV 1 (SUTURE) IMPLANT
SUT SILK 2 0 SH (SUTURE) ×1 IMPLANT
SUT SILK 3-0 18XBRD TIE 12 (SUTURE) IMPLANT
SUT VIC AB 2-0 CT1 TAPERPNT 27 (SUTURE) ×2 IMPLANT
SUT VIC AB 3-0 SH 27X BRD (SUTURE) ×2 IMPLANT
SUT VIC AB 4-0 PS2 18 (SUTURE) ×2 IMPLANT
SYR 3ML LL SCALE MARK (SYRINGE) IMPLANT
TOWEL GREEN STERILE (TOWEL DISPOSABLE) ×1 IMPLANT
TRAY FOLEY MTR SLVR 16FR STAT (SET/KITS/TRAYS/PACK) ×1 IMPLANT
UNDERPAD 30X36 HEAVY ABSORB (UNDERPADS AND DIAPERS) ×1 IMPLANT

## 2024-01-09 NOTE — Op Note (Signed)
 Patient name: Donald [name] MRN: [id number] DOB: Dec 02, 1926 Sex: male  01/09/2024 Pre-operative Diagnosis: Right leg ischemia Post-operative diagnosis:  Same Surgeon:  Malvina New Assistants:  KYM Damme, PA Procedure:   #1: Open right femoral artery exposure   #2: Right common femoral, profundofemoral, and femoral-popliteal thrombectomy Anesthesia:  General Blood Loss:  200 Specimens: Thrombus sent to pathology  Findings: Acute thrombus was evacuated from the common femoral profunda and superficial femoral artery.  He had triphasic posterior tibial and anterior tibial signals after thrombectomy  Indications: This is a 89 year old gentleman who developed right leg pain and numbness earlier this evening.  He was found to be without Doppler signals in his leg.  He was taken for CT scan that shows femoral occlusion.  He had recently had a fall and was taken off of his anticoagulation for his A-fib.  We discussed going to the operating room for thrombectomy.  We also talked about possible delayed fasciotomy if indicated  Procedure:  The patient was identified in the holding area and taken to [facility] OR ROOM 11  The patient was then placed supine on the table. general anesthesia was administered.  The patient was prepped and draped in the usual sterile fashion.  A time out was called and antibiotics were administered.  A PA was necessary to expedite the procedure and assist with technical details.  She helped with exposure by providing suction and retraction.  She helped with the arteriotomy closure by following the suture.  Ultrasound was used to identify the common femoral artery and the bifurcation.  I made an oblique incision above the groin crease.  Cautery was used to divide subcutaneous tissue down to the femoral sheath which was opened sharply.  I then exposed the common femoral artery from the inguinal ligament down to the bifurcation.  The superficial femoral and profundofemoral artery were  individually isolated.  I gave an additional 5000 units of heparin .  He had had heparin  running continuously from the ER.  There was a palpable pulse in the common femoral artery.  I then occluded the common femoral artery and made a transverse arteriotomy at the level of the profunda as there was plaque anteriorly in the more proximal common femoral artery.  Upon opening the artery there was fresh thrombus.  I then passed a #3 Fogarty down the superficial femoral artery and evacuated a large amount of thrombus.  I was able to advance the #3 Fogarty all the way down to its.  Multiple passes were made until there was a negative pass.  The artery was then occluded.  The Fogarty was then passed down the profunda and moderate amount of thrombus was evacuated followed by good backbleeding.  Negative passes were performed.  I then used a #4 Fogarty catheter passed this into the aorta.  No thrombus was evacuated.  I then passed the #4 Fogarty catheter 2 times down the superficial femoral artery all the way down to its hub.  No thrombus was evacuated.  I then closed the arteriotomy transversely with running 5-0 Prolene.  Patient had excellent Doppler signals in the superficial femoral and profundofemoral artery and multiphasic signals in the posterior tibial and anterior tibial artery.  I was satisfied with these results.  I elected to give the patient 25 mg of protamine.  Surgiflo was used for hemostasis.  Once I was satisfied with hemostasis the femoral sheath was reapproximated with 2-0 Vicryl.  The subcutaneous tissue was then closed with 3-0 Vicryl and  the skin was closed with Monocryl followed by Dermabond.  He was successfully extubated and taken recovery in stable condition.  There were no immediate complications.   Disposition: To PACU stable.   ALONSO Malvina New, M.D., [facility] Office: [phone] Pager:  [phone]

## 2024-01-09 NOTE — Transfer of Care (Signed)
 Immediate Anesthesia Transfer of Care Note  Patient: Donald [name]  Procedure(s) Performed: RIGHT FEMORAL ARTERY THROMBECTOMY (Right: Groin)  Patient Location: PACU  Anesthesia Type:General  Level of Consciousness: drowsy and patient cooperative  Airway & Oxygen Therapy: Patient Spontanous Breathing and Patient connected to face mask oxygen  Post-op Assessment: Report given to RN, Post -op Vital signs reviewed and stable, Patient moving all extremities, and Patient moving all extremities X 4  Post vital signs: Reviewed and stable  Last Vitals:  Vitals Value Taken Time  BP 118/74 01/09/24  11:06  Temp    Pulse 68 01/09/24 11:06  Resp 26 01/09/24 11:06  SpO2 100 % 01/09/24 11:06  Vitals shown include unfiled device data.  Last Pain:  Vitals:   01/09/24 0629  TempSrc: Oral         Complications: No notable events documented.

## 2024-01-09 NOTE — Progress Notes (Addendum)
 Pt arrived to 4E, VSS, AAOx4, right groin approximated, no hematoma, no bedrest order is in but for precaution will keep patient in bed until 1500. Stage 1 documented in chart on sacrum foam applied. Oriented to unit, call light within reach, pt has fallen recently, bed alarm is on.    Bart Ashford C Warden, RN 01/09/2024 1:24 PM

## 2024-01-09 NOTE — ED Notes (Addendum)
 Patient being seen by Dr Darra. He states the problem started at 1am. States he was normal before going to bed.  Patient has decreased sensation in the right lower leg and right foot abut can feel me touching the foot. Patient states he has some pain in the right calf.

## 2024-01-09 NOTE — Anesthesia Preprocedure Evaluation (Addendum)
 Anesthesia Evaluation  Patient identified by MRN, date of birth, ID band Patient awake    Reviewed: Allergy & Precautions, NPO status , Patient's Chart, lab work & pertinent test results  History of Anesthesia Complications Negative for: history of anesthetic complications  Airway Mallampati: II  TM Distance: >3 FB Neck ROM: Full    Dental  (+) Chipped, Missing, Dental Advisory Given   Pulmonary asthma , COPD,  COPD inhaler   breath sounds clear to auscultation       Cardiovascular hypertension, Pt. on medications (-) angina + CAD, + CABG, + Peripheral Vascular Disease and +CHF  + dysrhythmias Atrial Fibrillation  Rhythm:Regular Rate:Normal  11/2023 ECHO:  1. Akinesis of the distal septum and apex with overall mild to moderate  LV dysfunction; trileaflet aortic valve with fixed noncoronary cusp with  likely mild AS (mean gradient 7.5 mmHg, DI 0.38, AVA 1.4 cm2); swirling  noted at LV apex but no definite thrombus.   2. Left ventricular EF 40 to 45%. The left ventricle has mildly decreased function. Grade I diastolic dysfunction (impaired relaxation).   3. RVF is normal. The right ventricular size is mildly enlarged.   4. Left atrial size was severely dilated.   5. The mitral valve is normal in structure. Mild mitral valve regurgitation. No evidence of mitral stenosis.   6. Tricuspid valve regurgitation is moderate to severe.   7. The aortic valve is normal in structure. Aortic valve regurgitation is trivial. No aortic stenosis is present.     Neuro/Psych Glaucoma Recent fall: was in hard collar x2 weeks, NSU cleared neck 01/07/2024 CVA, No Residual Symptoms    GI/Hepatic negative GI ROS, Neg liver ROS,,,  Endo/Other  Hypothyroidism    Renal/GU Renal InsufficiencyRenal disease     Musculoskeletal  (+) Arthritis ,    Abdominal   Peds  Hematology Hb 12.9, plt 263k Off of Eliquis  x 2 weeks   Anesthesia Other  Findings Prostate cancer  Reproductive/Obstetrics                              Anesthesia Physical Anesthesia Plan  ASA: 4 and emergent  Anesthesia Plan: General   Post-op Pain Management: Ofirmev  IV (intra-op)*   Induction: Intravenous  PONV Risk Score and Plan: 2 and Ondansetron  and Dexamethasone   Airway Management Planned: Oral ETT and Video Laryngoscope Planned  Additional Equipment:   Intra-op Plan:   Post-operative Plan: Extubation in OR  Informed Consent: I have reviewed the patients History and Physical, chart, labs and discussed the procedure including the risks, benefits and alternatives for the proposed anesthesia with the patient or authorized representative who has indicated his/her understanding and acceptance.     Dental advisory given  Plan Discussed with: CRNA and Surgeon  Anesthesia Plan Comments:          Anesthesia Quick Evaluation

## 2024-01-09 NOTE — Progress Notes (Signed)
   01/09/24 2138  Provider Notification  Provider Name/Title Caren, clinical pharmacist  Date Provider Notified 01/09/24  Time Provider Notified 2138  Method of Notification Call  Notification Reason Requested by patient to reevaluate home med called Fosamax ( Alendronate)   Provider response Evaluated remotely;No new orders.  RN was informed by clinical pharmacist that per hospital policy, this medication will not give during the hospitalization.  We explained to the Pt about this policy.   Date of Provider Response 01/09/24  Time of Provider Response 2138    Wendi Dash, RN

## 2024-01-09 NOTE — ED Provider Notes (Signed)
 Clinical Course as of 01/09/24 1549  Sun Jan 09, 2024  9240 Received signout dispo pending evaluation by vascular surgery as well as CT scans.  Vascular surgery notes:   This is a 89 y.o. male PMH significant for CAD, HFmrEF, mod-severe TR, Hypothyroidism, PAF now off of Eliquis  due to recent falls who presents via EMS after right lower extremity became painful, numb and cold earlier this morning. He has absent doppler signals in right lower extremity. Palpable femoral pulses. Due to being off his anticoagulation for 2 weeks now after recent falls, suspect he has embolized to his right lower extremity. He has been started on IV heparin . Minimal improvement of symptoms. I discussed with patient and his granddaughter that we can see if he improves on heparin  but suspect that he will need to go to the operating room for a thrombectomy. CTA BLE is pending. The on call vascular surgeon, Dr. Serene will see patient shortly to provider further recommendations. Please keep patient NPO.  [TY]    Clinical Course User Index [TY] Neysa Caron PARAS, DO      Neysa Caron PARAS, [place] 01/09/24 1549

## 2024-01-09 NOTE — ED Triage Notes (Addendum)
 Patient arrives via EMS for numbness and discomfort for 12 hours. Patient is from home. Patient has no sensation in the right foot. EMS reports finding no pedal pulses in the right foot and cold to touch. Patient denies falls or injuries tonight.

## 2024-01-09 NOTE — Progress Notes (Signed)
   01/09/24 2130  Provider Notification  Provider Name/Title C. Estell, PA  Date Provider Notified 01/09/24  Time Provider Notified 2130  Method of Notification Call  Notification Reason Pt requests to continue his home med Fosamax 70 mg once /week on Sunday with empty stomach with a full glass of water .  Provider response Evaluate remotely, verbal order recieved for Fozamax 70 mg PO once.   Date of Provider Response 01/09/24  Time of Provider Response 2140   Wendi Dash, RN

## 2024-01-09 NOTE — Plan of Care (Signed)

## 2024-01-09 NOTE — ED Notes (Signed)
 Surgeon came to see pt in CT while this RN started a 2nd PIV on CT table, family at beside updated about that by this RN.

## 2024-01-09 NOTE — Consult Note (Addendum)
 Hospital Consult    Reason for Consult:  ischemic RLE Requesting Physician:  Dr. Darra MRN #:  [id number]  History of Present Illness: This is a 89 y.o. male with PMH significant for CAD, HFmrEF, mod-severe TR, Hypothyroidism, PAF now off of Eliquis  due to recent falls who presents via EMS after right lower extremity became painful, numb and cold earlier this morning. His granddaughter is at bedside. He explains that he has had several episodes recently where the foot will have a numb sensation but not like this. He says this began around 1 am and he knew something was wrong so he called his granddaughter who told him to call EMS. He reports that he normally ambulates without any pain or difficulty. He denies any pain at rest. No issues with wound healing. His family did just recently get him a walker after his recent fall. He has been using this intermittently. He was discharged on 9/29 and has been off of his Eliquis  since. Patient has been started on IV Heparin . Minimal improvement in how the leg feels. Vascular surgery was consulted for evaluation of critical limb ischemia.   Past Medical History:  Diagnosis Date   Asthma    with worsening with beta blockade   Atrial fibrillation (HCC)    CAD (coronary artery disease)    CVA (cerebral infarction) 2012   tia, mild no residual defecits   GI bleed 8 yrs ago   due to doll fully vessel which was clipped   Glaucoma    excellent cataracts   History of kidney stones    Ischemic cardiomyopathy    Moderate to severe mitral regurgitation 06/16/2017   Nephrolithiasis    Post-infarction apical thrombus [facility])    Prostate cancer [facility]) 2012   Radiation proctitis Feb 2015   treated with APC   Tubular adenoma 04/2013   Dr. Albertus    Past Surgical History:  Procedure Laterality Date   cardiac stents  2003   CARDIOVERSION N/A 06/15/2017   Procedure: CARDIOVERSION;  Surgeon: Alveta Aleene PARAS, MD;  Location: [facility] ENDOSCOPY;  Service: Cardiovascular;   Laterality: N/A;   CARDIOVERSION N/A 07/15/2017   Procedure: CARDIOVERSION;  Surgeon: Jeffrie Oneil BROCKS, MD;  Location: [facility] ENDOSCOPY;  Service: Cardiovascular;  Laterality: N/A;   CARDIOVERSION N/A 07/20/2017   Procedure: CARDIOVERSION;  Surgeon: Charls Pearla LABOR, MD;  Location: [facility] ENDOSCOPY;  Service: Cardiovascular;  Laterality: N/A;   CARDIOVERSION N/A 11/23/2017   Procedure: CARDIOVERSION;  Surgeon: Pietro Redell RAMAN, MD;  Location: [facility] ENDOSCOPY;  Service: Cardiovascular;  Laterality: N/A;   CARDIOVERSION N/A 06/03/2021   Procedure: CARDIOVERSION;  Surgeon: Kate Lonni CROME, MD;  Location: [facility] ENDOSCOPY;  Service: Cardiovascular;  Laterality: N/A;   CARDIOVERSION N/A 04/03/2022   Procedure: CARDIOVERSION;  Surgeon: Francyne Headland, MD;  Location: [facility] ENDOSCOPY;  Service: Cardiovascular;  Laterality: N/A;   COLONOSCOPY WITH PROPOFOL  N/A 05/05/2013   Procedure: COLONOSCOPY WITH PROPOFOL ;  Surgeon: Gordy CHRISTELLA Albertus, MD;  Location: [facility] ENDOSCOPY;  Service: Gastroenterology;  Laterality: N/A;   CORONARY ARTERY BYPASS GRAFT  1998   LIMA to the LAD and saphenous vein graft tothe diagonal   EXTRACORPOREAL SHOCK WAVE LITHOTRIPSY Right 04/29/2017   Procedure: RIGHT EXTRACORPOREAL SHOCK WAVE LITHOTRIPSY (ESWL);  Surgeon: Alvaro Hummer, MD;  Location: [facility] ORS;  Service: Urology;  Laterality: Right;   history of radiation treatment  2012   x 40 treatments done   KNEE ARTHROSCOPY  2016 or 2017   Dr Heide ;    PARTIAL KNEE ARTHROPLASTY  Right 01/13/2017   Procedure: UNICOMPARTMENTAL RIGHT KNEE;  Surgeon: Melodi Lerner, MD;  Location: [facility] ORS;  Service: Orthopedics;  Laterality: Right;  with block    Allergies  Allergen Reactions   Beta Adrenergic Blockers Other (See Comments)    Other reaction(s): asthma exacerbation   Keflex [Cephalexin] Nausea And Vomiting and Other (See Comments)    'Killed all GI enzymes (also) and patient prefers to not take this   Morphine Nausea Only    Prior to Admission  medications   Medication Sig Start Date End Date Taking? Authorizing Provider  alendronate (FOSAMAX) 70 MG tablet Take 70 mg by mouth every Sunday. Take with a full glass of water  on an empty stomach.   Yes [provider]  amiodarone  (PACERONE ) 100 MG tablet Take 1 tablet (100 mg total) by mouth daily. 06/22/23  Yes Pietro Redell RAMAN, MD  Cholecalciferol  (VITAMIN D ) 2000 UNITS tablet Take 2,000 Units by mouth daily.   Yes [provider]  furosemide  (LASIX ) 40 MG tablet Take 1 tablet (40 mg total) by mouth every Monday, Wednesday, and Friday. You can take extra tablet of Lasix  40 mg if needed for shortness of breath or leg swelling 12/27/23  Yes Drusilla Sabas RAMAN, MD  levothyroxine  (SYNTHROID ) 75 MCG tablet Take 75 mcg by mouth daily before breakfast. 01/11/23  Yes [provider]  loratadine  (CLARITIN ) 10 MG tablet Take 10 mg by mouth daily.   Yes [provider]  Multiple Vitamin (MULTIVITAMIN WITH MINERALS) TABS tablet Take 1 tablet by mouth daily.   Yes [provider]  pravastatin  (PRAVACHOL ) 40 MG tablet TAKE 1 TABLET EVERY EVENING 03/25/21  Yes Burnard Debby LABOR, MD  HYDROcodone -acetaminophen  (NORCO/VICODIN) 5-325 MG tablet Take 1-2 tablets by mouth every 6 (six) hours as needed for moderate pain (pain score 4-6) or severe pain (pain score 7-10). 12/27/23   Drusilla Sabas RAMAN, MD  Multiple Vitamins-Minerals (ICAPS AREDS 2 PO) Take 1 tablet by mouth daily.    [provider]    Social History   Socioeconomic History   Marital status: Widowed    Spouse name: Not on file   Number of children: 3   Years of education: Not on file   Highest education level: Not on file  Occupational History   Occupation: Retired Equities trader  Tobacco Use   Smoking status: Never   Smokeless tobacco: Never  Vaping Use   Vaping status: Never Used  Substance and Sexual Activity   Alcohol  use: Yes    Alcohol /week: 0.0 standard drinks of alcohol     Comment:  occasional beer or wine   Drug use: No   Sexual activity: Not Currently    Birth control/protection: Abstinence  Other Topics Concern   Not on file  Social History Narrative   Not on file   Social Drivers of Health   Financial Resource Strain: Not on file  Food Insecurity: No Food Insecurity (12/26/2023)   Hunger Vital Sign    Worried About Running Out of Food in the Last Year: Never true    Ran Out of Food in the Last Year: Never true  Transportation Needs: No Transportation Needs (12/26/2023)   PRAPARE - Administrator, Civil Service (Medical): No    Lack of Transportation (Non-Medical): No  Physical Activity: Not on file  Stress: Not on file  Social Connections: Moderately Integrated (12/26/2023)   Social Connection and Isolation Panel    Frequency of Communication with Friends and Family: More than  three times a week    Frequency of Social Gatherings with Friends and Family: More than three times a week    Attends Religious Services: More than 4 times per year    Active Member of Clubs or Organizations: Yes    Attends Banker Meetings: More than 4 times per year    Marital Status: Widowed  Intimate Partner Violence: Not At Risk (12/26/2023)   Humiliation, Afraid, Rape, and Kick questionnaire    Fear of Current or Ex-Partner: No    Emotionally Abused: No    Physically Abused: No    Sexually Abused: No     Family History  Problem Relation Age of Onset   Heart attack Father    Heart disease Mother    Stomach cancer Mother     ROS: Otherwise negative unless mentioned in HPI  Physical Examination  Vitals:   01/09/24 0629 01/09/24 0715  BP: 110/65 117/70  Pulse: 61 (!) 58  Resp: 15 16  Temp: 97.9 F (36.6 C)   SpO2: 95% 97%   Body mass index is 22.1 kg/m.  General:  WDWN in NAD Gait: Not observed HENT: WNL, normocephalic Pulmonary: normal non-labored breathing Cardiac: irregular Abdomen: soft Vascular Exam/Pulses: 2+ femoral  pulses, No palpable distal pulses. No doppler popliteal, peroneal, DP or PT signals on the right. Popliteal, Dp and Pt doppler signals on left. Left leg with motor and sensation in tact. Right distal leg and foot cool. Limited dorsi and plantar flexion, unable to wiggle toes on right foot. Intermittent sensation in distal right leg and foot. Left leg motor and sensation intact.  Musculoskeletal: no muscle wasting or atrophy  Neurologic: A&O X 3;  No focal weakness or paresthesias are detected; speech is fluent/normal Psychiatric:  The pt has Normal affect.   CBC    Component Value Date/Time   WBC 7.5 01/09/2024 0653   RBC 4.06 (L) 01/09/2024 0653   HGB 12.9 (L) 01/09/2024 0653   HGB 14.1 04/02/2022 1652   HCT 39.9 01/09/2024 0653   HCT 43.3 04/02/2022 1652   PLT 263 01/09/2024 0653   PLT 237 04/02/2022 1652   MCV 98.3 01/09/2024 0653   MCV 97 04/02/2022 1652   MCH 31.8 01/09/2024 0653   MCHC 32.3 01/09/2024 0653   RDW 13.6 01/09/2024 0653   RDW 12.6 04/02/2022 1652   LYMPHSABS 0.8 01/09/2024 0653   LYMPHSABS 1.5 04/02/2022 1652   MONOABS 0.4 01/09/2024 0653   EOSABS 0.1 01/09/2024 0653   EOSABS 0.2 04/02/2022 1652   BASOSABS 0.0 01/09/2024 0653   BASOSABS 0.0 04/02/2022 1652    BMET    Component Value Date/Time   NA 136 12/27/2023 0338   NA 139 04/02/2022 1652   K 3.6 12/27/2023 0338   CL 104 12/27/2023 0338   CO2 27 12/27/2023 0338   GLUCOSE 112 (H) 12/27/2023 0338   BUN 19 12/27/2023 0338   BUN 25 04/02/2022 1652   CREATININE 0.93 12/27/2023 0338   CREATININE 0.92 03/11/2016 0954   CALCIUM 8.6 (L) 12/27/2023 0338   GFRNONAA >60 12/27/2023 0338   GFRAA 68 01/29/2020 1057    COAGS: Lab Results  Component Value Date   INR 1.0 01/09/2024   INR 1.33 07/26/2017   INR 1.59 06/15/2017   PROTIME 16.4 09/10/2008     Non-Invasive Vascular Imaging:   CTA BLE pending  Statin:  Yes.   Beta Blocker:  No. Aspirin :  No. ACEI:  No. ARB:  No. CCB use:  No  Other  antiplatelets/anticoagulants:  No.    ASSESSMENT/PLAN: This is a 89 y.o. male PMH significant for CAD, HFmrEF, mod-severe TR, Hypothyroidism, PAF now off of Eliquis  due to recent falls who presents via EMS after right lower extremity became painful, numb and cold earlier this morning. He has absent doppler signals in right lower extremity. Palpable femoral pulses. Due to being off his anticoagulation for 2 weeks now after recent falls, suspect he has embolized to his right lower extremity. He has been started on IV heparin . Minimal improvement of symptoms. I discussed with patient and his granddaughter that we can see if he improves on heparin  but suspect that he will need to go to the operating room for a thrombectomy. CTA BLE is pending. The on call vascular surgeon, Dr. Serene will see patient shortly to provider further recommendations. Please keep patient NPO.    Teretha Damme PA-C [facility] [phone] 01/09/2024  7:26 AM  I agree with the above.  I have seen and evaluated the patient.  Briefly this is a 89 year old gentleman who has been on Eliquis  for A-fib but had to stop this because of a fall.  Beginning earlier this morning he developed numbness in his leg which became painful and cold.  He was brought to the ER.  There were no Doppler signals in his foot.  He had a CT scan that shows occlusion beginning in the common femoral.  We discussed going to the operating room for thrombectomy.  We talked about delayed fasciotomies.  All questions were answered.  Malvina Serene

## 2024-01-09 NOTE — ED Provider Notes (Signed)
 Emergency Department Provider Note   I have reviewed the triage vital signs and the nursing notes.   HISTORY  Chief Complaint Numbness   HPI Donald [name] is a 89 y.o. male with past history reviewed below, not on anticoagulation, presents to the emergency department with acute onset right leg numbness, coolness, discoloration.  He is symptoms from the right hip down. No back pain. No arm/face symptoms. Patient woke up at 1am with symptoms and describes that he feels like I have an artery problem. Denies any history of arterial occlusion in the past. Some pain at rest. No claudication symptoms previously.   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 Albertus    Review of Systems  Constitutional: No fever/chills Cardiovascular: Denies chest pain. Respiratory: Denies shortness of breath. Gastrointestinal: No abdominal pain.  No nausea, no vomiting. Musculoskeletal: Right foot pain/numbness.  Skin: Negative for rash. Neurological: Negative for headaches. No weakness. Positive numbness.  ____________________________________________   PHYSICAL EXAM:  VITAL SIGNS: ED Triage Vitals  Encounter Vitals Group     BP 01/09/24 0629 110/65     Pulse Rate 01/09/24 0629 61     Resp 01/09/24 0629 15     Temp 01/09/24 0629 97.9 F (36.6 C)     Temp Source 01/09/24 0629 Oral     SpO2 01/09/24 0616 95 %     Weight 01/09/24 0629 154  lb (69.9 kg)     Height 01/09/24 0629 5' 10 (1.778 m)   Constitutional: Alert and oriented. Well appearing and in no acute distress. Eyes: Conjunctivae are normal.  Head: Atraumatic. Nose: No congestion/rhinnorhea. Mouth/Throat: Mucous membranes are moist.   Neck: No stridor.   Cardiovascular: Normal rate, regular rhythm. Good peripheral circulation. Grossly normal heart sounds.   Respiratory: Normal respiratory effort.  No retractions. Lungs CTAB. Gastrointestinal: Soft and nontender. No distention.  Musculoskeletal: Right lower extremity is cool to touch with decreased perfusion.  No palpable DP or PT pulse.  Unable to Doppler pulse either.  Palpable left DP.  Neurologic:  Normal speech and language. No gross focal neurologic deficits are appreciated.  Skin:  Skin is warm, dry and intact. No rash noted.   ____________________________________________   LABS (all labs ordered are listed, but only abnormal results are displayed)  Labs Reviewed  BASIC METABOLIC PANEL WITH GFR - Abnormal; Notable for the following components:      Result Value   Potassium 3.4 (*)    Chloride 95 (*)    Glucose, Bld 140 (*)    Anion gap 16 (*)    All other components within normal limits  CBC WITH DIFFERENTIAL/PLATELET - Abnormal; Notable for the following components:   RBC 4.06 (*)    Hemoglobin 12.9 (*)    All other components within normal limits  SURGICAL PCR SCREEN  PROTIME-INR  HEPARIN  LEVEL (UNFRACTIONATED)  LIPID PANEL  BASIC METABOLIC PANEL WITH GFR  CBC  TYPE AND SCREEN  SURGICAL PATHOLOGY    ____________________________________________   PROCEDURES  Procedure(s) performed:   Procedures  CRITICAL CARE Performed by: Fonda KANDICE Law Total critical care time: 35 minutes Critical care time was exclusive of separately billable procedures and treating other patients. Critical care was necessary to treat or prevent imminent or life-threatening deterioration. Critical care was  time spent personally by me on the following activities: development of treatment plan with patient and/or surrogate as well as nursing, discussions with consultants, evaluation of patient's response to treatment, examination of patient, obtaining history from patient or surrogate, ordering and performing treatments and interventions, ordering and review of laboratory studies, ordering and review of radiographic studies, pulse oximetry and re-evaluation of patient's condition.  Fonda Law, MD Emergency Medicine  ____________________________________________   INITIAL IMPRESSION / ASSESSMENT AND PLAN / ED COURSE  Pertinent labs & imaging results that were available during my care of the patient were reviewed by me and considered in my medical decision making (see chart for details).   This patient is Presenting for Evaluation of leg numbness/cool to touch, which does require a range of treatment options, and is a complaint that involves a high risk of morbidity and mortality.  The Differential Diagnoses include critical limb ischemia, sciatica, CVA, cauda equina, etc.  Critical Interventions-    Medications  amiodarone  (PACERONE ) tablet 100 mg (100 mg Oral Given 01/09/24 1713)  pravastatin  (PRAVACHOL ) tablet 40 mg (40 mg Oral Given 01/09/24 1714)  levothyroxine  (SYNTHROID ) tablet 75 mcg (has no administration in time range)  0.9 %  sodium chloride  infusion (has no administration in time range)  potassium chloride  SA (KLOR-CON  M) CR tablet 40-60 mEq (has no administration in time range)  acetaminophen  (TYLENOL ) tablet 325-650 mg (650 mg Oral Given 01/09/24 2246)    Or  acetaminophen  (TYLENOL ) suppository 325-650 mg ( Rectal See Alternative 01/09/24 2246)  docusate sodium  (COLACE) capsule 100 mg (has no administration in time range)  hydrALAZINE (APRESOLINE) injection 5 mg (has no administration in time range)  phenol (CHLORASEPTIC) mouth spray 1 spray (has no administration in time range)   sodium chloride  flush (NS) 0.9 % injection 3 mL (3 mLs Intravenous Given 01/09/24 2247)  sodium chloride  flush (NS) 0.9 % injection 3 mL (has no administration in time range)  0.9 %  sodium chloride  infusion (has no administration in time range)  aspirin  EC tablet 81 mg (has no administration in time range)  senna-docusate (Senokot-S) tablet 1 tablet (has no administration in time range)  bisacodyl  (DULCOLAX) EC tablet 5 mg (has no administration in time range)  HYDROmorphone  (DILAUDID ) injection 0.5-1 mg (has no administration in time range)  oxyCODONE  (Oxy IR/ROXICODONE ) immediate release tablet 5-10 mg (has no administration in time range)  Chlorhexidine  Gluconate Cloth 2 % PADS 6 each (0 each Topical Duplicate 01/10/24 0600)  heparin  ADULT infusion 100 units/mL (25000 units/250mL) (1,100 Units/hr Intravenous Rate/Dose Verify 01/09/24 2300)  heparin  bolus via infusion 3,000 Units (3,000 Units Intravenous Bolus from Bag 01/09/24 0737)  Oral care mouth rinse (0 mLs Mouth Rinse Duplicate 01/09/24 0900)  chlorhexidine  (PERIDEX ) 0.12 % solution 15 mL (15 mLs Mouth/Throat Given 01/09/24 0859)    Or  Oral care mouth rinse ( Mouth Rinse See Alternative 01/09/24 0859)  iohexol (OMNIPAQUE) 350 MG/ML injection 100 mL (100 mLs Intravenous Contrast Given 01/09/24 0838)  ceFAZolin (ANCEF) IVPB 2g/100 mL premix (2 g Intravenous New  Bag/Given 01/09/24 2304)    Reassessment after intervention: pain unchanged.   Clinical Laboratory Tests Ordered, included CBC without anemia. Remaining labs pending.   Radiologic Tests Ordered, included CT angio lower extremity pending.   Cardiac Monitor Tracing which shows NSR>   Consult complete with Vascular Surgery to make them aware of suspected critical limb ischemia. CTA pending.   Medical Decision Making: Summary:  Called the triage to evaluate patient with acute onset right leg numbness.  The extremity is cool to touch and I cannot Doppler or palpate a pulse  in the foot.  I am able to Doppler pulse contralaterally.  Suspect limb ischemia.  Plan for heparin  and CT angio of the lower extremities. Will make vascular surgery aware.   Reevaluation with update and discussion with patient. Care transferred to Dr. Neysa.   Patient's presentation is most consistent with acute presentation with potential threat to life or bodily function.   Disposition: admit  ____________________________________________  FINAL CLINICAL IMPRESSION(S) / ED DIAGNOSES  Final diagnoses:  Critical limb ischemia of right lower extremity with ulceration of lower leg (HCC)    Note:  This document was prepared using Dragon voice recognition software and may include unintentional dictation errors.  Fonda Law, MD, [facility] Emergency Medicine    Nikodem Leadbetter, Fonda MATSU, MD 01/09/24 [phone]

## 2024-01-09 NOTE — Progress Notes (Addendum)
 PHARMACY - ANTICOAGULATION CONSULT NOTE  Pharmacy Consult for heparin  Indication: limb ischemia  Allergies  Allergen Reactions   Beta Adrenergic Blockers Other (See Comments)    Other reaction(s): asthma exacerbation   Keflex [Cephalexin] Nausea And Vomiting and Other (See Comments)    'Killed all GI enzymes (also) and patient prefers to not take this   Morphine Nausea Only    Patient Measurements: Height: 5' 10 (177.8 cm) Weight: 69.9 kg (154 lb) IBW/kg (Calculated) : 73 HEPARIN  DW (KG): 69.9  Vital Signs: Temp: 96.1 F (35.6 C) (10/12 1303) Temp Source: Axillary (10/12 1303) BP: 102/60 (10/12 1303) Pulse Rate: 60 (10/12 1303)  Estimated Creatinine Clearance: 47 mL/min (by C-G formula based on SCr of 0.93 mg/dL).   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 Albertus   Assessment: 89yo male c/o numbness and discomfort x12h to right foot, upon evaluation there are no pedal pulses and foot is cold to touch >> to start heparin  for concern for ischemic limb.  Of note pt has been on apixaban  in the past for PAF but was d/c'd during recent admission after a syncopal collapse resulting in C6 fracture. Consulted to start heparin  8 hours after procedure. CBC stable.  Goal of Therapy:  Heparin  level 0.3-0.7 units/ml Monitor platelets by anticoagulation protocol: Yes   Plan:  Start heparin  infusion at 1100  units/hr. Check heparin  level 8 hours after start Monitor daily CBC, signs of bleeding   Dionicia Canavan, PharmD, RPh PGY1 Acute Care Pharmacy Resident [facility]  01/09/2024 1:56 PM

## 2024-01-09 NOTE — Progress Notes (Signed)
 MEWS Progress Note  Patient Details Name: KRUZ CHIU MRN: [id number] DOB: 03-Sep-1926 Today's Date: 01/09/2024   MEWS Flowsheet Documentation:  Assess: MEWS Score Temp: (!) 96.8 F (36 C) BP: (!) 99/56 MAP (mmHg): 70 Pulse Rate: 64 ECG Heart Rate: 66 Resp: 19 Level of Consciousness: Alert SpO2: 98 % O2 Device: Room Air O2 Flow Rate (L/min): 6 L/min Assess: MEWS Score MEWS Temp: 1 MEWS Systolic: 1 MEWS Pulse: 0 MEWS RR: 0 MEWS LOC: 0 MEWS Score: 2 MEWS Score Color: Yellow Assess: SIRS CRITERIA SIRS Temperature : 0 SIRS Respirations : 0 SIRS Pulse: 0 SIRS WBC: 0 SIRS Score Sum : 0 Assess: if the MEWS score is Yellow or Red Were vital signs accurate and taken at a resting state?: Yes Does the patient meet 2 or more of the SIRS criteria?: No MEWS guidelines implemented : Yes, yellow Treat MEWS Interventions: Considered administering scheduled or prn medications/treatments as ordered Take Vital Signs Increase Vital Sign Frequency : Yellow: Q2hr x1, continue Q4hrs until patient remains green for 12hrs Escalate MEWS: Escalate: Yellow: Discuss with charge nurse and consider notifying provider and/or RRT Notify: Charge Nurse/RN Name of Charge Nurse/RN Notified: Wyvonna Almarie DELENA Germaine 01/09/2024, 4:11 PM

## 2024-01-09 NOTE — Anesthesia Postprocedure Evaluation (Signed)
 Anesthesia Post Note  Patient: Donald [name]  Procedure(s) Performed: RIGHT FEMORAL ARTERY THROMBECTOMY (Right: Groin)     Patient location during evaluation: PACU Anesthesia Type: General Level of consciousness: oriented, patient cooperative and sedated Pain management: pain level controlled Vital Signs Assessment: post-procedure vital signs reviewed and stable Respiratory status: spontaneous breathing, nonlabored ventilation and respiratory function stable Cardiovascular status: blood pressure returned to baseline and stable Postop Assessment: no apparent nausea or vomiting Anesthetic complications: no   No notable events documented.  Last Vitals:  Vitals:   01/09/24 0745 01/09/24 1104  BP: 103/61 118/86  Pulse: (!) 58   Resp: 14   Temp:  (!) 35.8 C  SpO2: 95%     Last Pain:  Vitals:   01/09/24 1104  TempSrc:   PainSc: 0-No pain                 Kelon Easom,E. Zeferino Mounts

## 2024-01-09 NOTE — Anesthesia Procedure Notes (Signed)
 Procedure Name: Intubation Date/Time: 01/09/2024 9:18 AM  Performed by: Arvell Edsel HERO, CRNAPre-anesthesia Checklist: Patient identified, Emergency Drugs available, Suction available, Patient being monitored and Timeout performed Patient Re-evaluated:Patient Re-evaluated prior to induction Oxygen Delivery Method: Circle system utilized Preoxygenation: Pre-oxygenation with 100% oxygen Induction Type: IV induction Ventilation: Mask ventilation without difficulty and Oral airway inserted - appropriate to patient size Laryngoscope Size: Glidescope and 4 Grade View: Grade I Tube size: 7.0 mm Number of attempts: 1 Airway Equipment and Method: Stylet and Video-laryngoscopy Placement Confirmation: ETT inserted through vocal cords under direct vision, positive ETCO2 and breath sounds checked- equal and bilateral Secured at: 22 cm Tube secured with: Tape

## 2024-01-09 NOTE — Progress Notes (Signed)
 PHARMACY - ANTICOAGULATION CONSULT NOTE  Pharmacy Consult for heparin  Indication: limb ischemia  Allergies  Allergen Reactions   Beta Adrenergic Blockers Other (See Comments)    Other reaction(s): asthma exacerbation   Keflex [Cephalexin] Nausea And Vomiting and Other (See Comments)    'Killed all GI enzymes (also) and patient prefers to not take this   Morphine Nausea Only    Patient Measurements: Height: 5' 10 (177.8 cm) Weight: 69.9 kg (154 lb) IBW/kg (Calculated) : 73 HEPARIN  DW (KG): 69.9  Vital Signs: Temp: 97.9 F (36.6 C) (10/12 0629) Temp Source: Oral (10/12 0629) BP: 110/65 (10/12 0629) Pulse Rate: 61 (10/12 0629)  Estimated Creatinine Clearance: 47 mL/min (by C-G formula based on SCr of 0.93 mg/dL).   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 Albertus   Assessment: 89yo male c/o numbness and discomfort x12h to right foot, upon evaluation there are no pedal pulses and foot is cold to touch >> to start heparin  for concern for ischemic limb.  Of note pt has been on apixaban  in the past for PAF but was d/c'd during recent admission after a syncopal collapse resulting in C6 fracture.  Goal of Therapy:  Heparin  level 0.3-0.7 units/ml Monitor platelets by anticoagulation protocol: Yes   Plan:  Heparin  3000 units IV bolus followed by infusion at 1100 units/hr. Monitor heparin  levels and  CBC.  Marvetta Dauphin, PharmD, BCPS  01/09/2024,6:37 AM

## 2024-01-10 ENCOUNTER — Encounter (HOSPITAL_COMMUNITY): Payer: Self-pay | Admitting: Surgery

## 2024-01-10 DIAGNOSIS — I2510 Atherosclerotic heart disease of native coronary artery without angina pectoris: Secondary | ICD-10-CM | POA: Diagnosis not present

## 2024-01-10 DIAGNOSIS — Z86718 Personal history of other venous thrombosis and embolism: Secondary | ICD-10-CM

## 2024-01-10 DIAGNOSIS — I5022 Chronic systolic (congestive) heart failure: Secondary | ICD-10-CM

## 2024-01-10 DIAGNOSIS — R55 Syncope and collapse: Secondary | ICD-10-CM | POA: Diagnosis not present

## 2024-01-10 DIAGNOSIS — Z9889 Other specified postprocedural states: Secondary | ICD-10-CM

## 2024-01-10 DIAGNOSIS — I480 Paroxysmal atrial fibrillation: Secondary | ICD-10-CM

## 2024-01-10 LAB — HEPARIN LEVEL (UNFRACTIONATED)
Heparin Unfractionated: 0.38 [IU]/mL (ref 0.30–0.70)
Heparin Unfractionated: 0.51 [IU]/mL (ref 0.30–0.70)

## 2024-01-10 LAB — CBC
HCT: 32.9 % — ABNORMAL LOW (ref 39.0–52.0)
Hemoglobin: 11 g/dL — ABNORMAL LOW (ref 13.0–17.0)
MCH: 32.1 pg (ref 26.0–34.0)
MCHC: 33.4 g/dL (ref 30.0–36.0)
MCV: 95.9 fL (ref 80.0–100.0)
Platelets: 221 K/uL (ref 150–400)
RBC: 3.43 MIL/uL — ABNORMAL LOW (ref 4.22–5.81)
RDW: 13.8 % (ref 11.5–15.5)
WBC: 14.6 K/uL — ABNORMAL HIGH (ref 4.0–10.5)
nRBC: 0 % (ref 0.0–0.2)

## 2024-01-10 LAB — BASIC METABOLIC PANEL WITH GFR
Anion gap: 12 (ref 5–15)
BUN: 23 mg/dL (ref 8–23)
CO2: 26 mmol/L (ref 22–32)
Calcium: 8.8 mg/dL — ABNORMAL LOW (ref 8.9–10.3)
Chloride: 97 mmol/L — ABNORMAL LOW (ref 98–111)
Creatinine, Ser: 1.19 mg/dL (ref 0.61–1.24)
GFR, Estimated: 56 mL/min — ABNORMAL LOW (ref >60)
Glucose, Bld: 133 mg/dL — ABNORMAL HIGH (ref 70–99)
Potassium: 3.5 mmol/L (ref 3.5–5.1)
Sodium: 135 mmol/L (ref 135–145)

## 2024-01-10 LAB — LIPID PANEL
Cholesterol: 97 mg/dL (ref 0–200)
HDL: 49 mg/dL (ref >40)
LDL Cholesterol: 41 mg/dL (ref 0–99)
Total CHOL/HDL Ratio: 2 ratio
Triglycerides: 33 mg/dL (ref <150)
VLDL: 7 mg/dL (ref 0–40)

## 2024-01-10 LAB — POCT ACTIVATED CLOTTING TIME: Activated Clotting Time: 291 s

## 2024-01-10 MED ORDER — APIXABAN 5 MG PO TABS
5.0000 mg | ORAL_TABLET | Freq: Two times a day (BID) | ORAL | Status: DC
  Administered 2024-01-10: 5 mg via ORAL
  Filled 2024-01-10: qty 1

## 2024-01-10 MED ORDER — ASPIRIN 81 MG PO TBEC: 81.0000 mg | DELAYED_RELEASE_TABLET | Freq: Every day | ORAL | 12 refills | Status: DC

## 2024-01-10 MED ORDER — OXYCODONE HCL 5 MG PO TABS: 5.0000 mg | ORAL_TABLET | Freq: Four times a day (QID) | ORAL | 0 refills | Status: DC | PRN

## 2024-01-10 MED ORDER — APIXABAN 5 MG PO TABS: 5.0000 mg | ORAL_TABLET | Freq: Two times a day (BID) | ORAL | 11 refills | Status: AC

## 2024-01-10 NOTE — Progress Notes (Signed)
 PHARMACY - ANTICOAGULATION CONSULT NOTE  Pharmacy Consult for heparin  Indication: limb ischemia  Allergies  Allergen Reactions   Beta Adrenergic Blockers Other (See Comments)    Other reaction(s): asthma exacerbation   Keflex [Cephalexin] Nausea And Vomiting and Other (See Comments)    'Killed all GI enzymes (also) and patient prefers to not take this   Morphine Nausea Only    Patient Measurements: Height: 5' 10 (177.8 cm) Weight: 69.9 kg (154 lb) IBW/kg (Calculated) : 73 HEPARIN  DW (KG): 69.9  Vital Signs: Temp: 97.9 F (36.6 C) (10/12 2259) Temp Source: Oral (10/12 2259) BP: 114/67 (10/12 2259) Pulse Rate: 70 (10/12 2259)  Estimated Creatinine Clearance: 47 mL/min (by C-G formula based on SCr of 0.93 mg/dL).   Assessment: 89yo male c/o numbness and discomfort x12h to right foot, upon evaluation there are no pedal pulses and foot is cold to touch >> to start heparin  for concern for ischemic limb.  Of note pt has been on apixaban  in the past for PAF but was d/c'd during recent admission after a syncopal collapse resulting in C6 fracture. Consulted to start heparin  8 hours after procedure. CBC stable  AM: anti-Xa level at goal (0.38) on 1100 units/hr. Per RN, no signs/symptoms of bleeding or infusion issues.  Goal of Therapy:  Heparin  level 0.3-0.7 units/ml Monitor platelets by anticoagulation protocol: Yes   Plan:  Continue heparin  infusion at 1100 units/hr. Check confirmatory heparin  level 8 hours Monitor daily CBC, signs of bleeding  Lynwood Poplar, PharmD, BCPS Clinical Pharmacist 01/10/2024 1:09 AM

## 2024-01-10 NOTE — Progress Notes (Deleted)
 Donald [name]

## 2024-01-10 NOTE — Progress Notes (Signed)
    Subjective  - POD #1, s/p right leg thrombectomy  Feels much better   Physical Exam:  Incision c/d/i Palpable right DP       Assessment/Plan:  POD #1  -discussed need to restart Eliquis  -Consulted cardiology  -anticipate d/c today  [name] 01/10/2024 8:58 AM --  Vitals:   01/10/24 0600 01/10/24 0753  BP: 99/61 (!) 97/59  Pulse: 69 62  Resp: 16 15  Temp:  97.6 F (36.4 C)  SpO2: 100% 94%    Intake/Output Summary (Last 24 hours) at 01/10/2024 0858 Last data filed at 01/10/2024 0600 Gross per 24 hour  Intake 1797.07 ml  Output 1705 ml  Net 92.07 ml     Laboratory CBC    Component Value Date/Time   WBC 14.6 (H) 01/10/2024 0427   HGB 11.0 (L) 01/10/2024 0427   HGB 14.1 04/02/2022 1652   HCT 32.9 (L) 01/10/2024 0427   HCT 43.3 04/02/2022 1652   PLT 221 01/10/2024 0427   PLT 237 04/02/2022 1652    BMET    Component Value Date/Time   NA 135 01/10/2024 0427   NA 139 04/02/2022 1652   K 3.5 01/10/2024 0427   CL 97 (L) 01/10/2024 0427   CO2 26 01/10/2024 0427   GLUCOSE 133 (H) 01/10/2024 0427   BUN 23 01/10/2024 0427   BUN 25 04/02/2022 1652   CREATININE 1.19 01/10/2024 0427   CREATININE 0.92 03/11/2016 0954   CALCIUM 8.8 (L) 01/10/2024 0427   GFRNONAA 56 (L) 01/10/2024 0427   GFRAA 68 01/29/2020 1057    COAG Lab Results  Component Value Date   INR 1.0 01/09/2024   INR 1.33 07/26/2017   INR 1.59 06/15/2017   PROTIME 16.4 09/10/2008   No results found for: PTT  Antibiotics Anti-infectives (From admission, onward)    Start     Dose/Rate Route Frequency Ordered Stop   01/09/24 1600  ceFAZolin (ANCEF) IVPB 2g/100 mL premix        2 g 200 mL/hr over 30 Minutes Intravenous Every 8 hours 01/09/24 1303 01/09/24 2334        V. Malvina Serene CLORE, M.D., [facility] [place] Office: [phone] Pager:  [phone]

## 2024-01-10 NOTE — Progress Notes (Signed)
   01/10/24 0644  Mobility (See group info for [facility] equipment and weight capacities recommendations)  HOB Elevated/Bed Position Self regulated  Activity Ambulated with assistance  Range of Motion/Exercises Active Assistive;All extremities  Level of Assistance Standby assist, set-up cues, supervision of patient - no hands on  Assistive Device Front wheel walker  Distance Ambulated (ft) 470 ft  Activity Response Tolerated well  Transport method Ambulatory   Wendi Dash, RN

## 2024-01-10 NOTE — Progress Notes (Signed)
 Discharge instructions reviewed with pt and his daughter.  Copy of instructions given to pt/daughter. Pt informed his scripts were sent to his pharmacy for pick up.  Pt will be d/c'd via wheelchair with belongings and will be escorted by staff.   Madelin Barefoot, RN SWOT

## 2024-01-10 NOTE — Progress Notes (Signed)
 PHARMACY - ANTICOAGULATION CONSULT NOTE  Pharmacy Consult for heparin  Indication: RLElimb ischemia  Allergies  Allergen Reactions   Beta Adrenergic Blockers Other (See Comments)    Other reaction(s): asthma exacerbation   Keflex [Cephalexin] Nausea And Vomiting and Other (See Comments)    'Killed all GI enzymes (also) and patient prefers to not take this   Morphine Nausea Only    Patient Measurements: Height: 5' 10 (177.8 cm) Weight: 69.9 kg (154 lb) IBW/kg (Calculated) : 73 HEPARIN  DW (KG): 69.9  Vital Signs: Temp: 97.6 F (36.4 C) (10/13 0753) Temp Source: Oral (10/13 0753) BP: 97/59 (10/13 0753) Pulse Rate: 62 (10/13 0753)  Estimated Creatinine Clearance: 36.7 mL/min (by C-G formula based on SCr of 1.19 mg/dL).   Assessment: 89yo male c/o numbness and discomfort x12h to right foot, upon evaluation there are no pedal pulses and foot is cold to touch >> to start heparin  for concern for ischemic limb.  Of note pt has been on apixaban  in the past for PAF but was d/c'd during recent admission after a syncopal collapse resulting in C6 fracture. Consulted to start heparin  8 hours after procedure. CBC stable  Now s/p RLE fem-pop thrombectomy. Heparin  level 0.51 is therapeutic on 1100 units/hr.   Goal of Therapy:  Heparin  level 0.3-0.7 units/ml Monitor platelets by anticoagulation protocol: Yes   Plan:  Continue heparin  infusion at 1100 units/hr. Monitor daily heparin  level, CBC, signs/symptoms of bleeding  F/u if restarting apixaban   Jinnie Door, PharmD, BCPS, BCCP Clinical Pharmacist  Please check AMION for all [facility] Pharmacy phone numbers After 10:00 PM, call Main Pharmacy [phone]

## 2024-01-10 NOTE — Evaluation (Signed)
 Occupational Therapy Evaluation Patient Details Name: Donald [name] MRN: [id number] DOB: 1926-10-20 Today's Date: 01/10/2024   History of Present Illness   89 y.o. male  presented 01/09/24 for critical limb ischemia, s/p Right common femoral, profundofemoral, and femoral-popliteal thrombectomy.  PMH paroxysmal atrial fibrillation, coronary artery disease, ischemic cardiomyopathy, history of prostate cancer, prior CVA.     Clinical Impressions Patient admitted for the diagnosis above.  PTA he lives at a local ILF, and remains fairly independent.  Recent fall with injuries, discharged home with [facility] services, and patient would like to continue.  No acute OT indicated.       If plan is discharge home, recommend the following:   A little help with walking and/or transfers;A little help with bathing/dressing/bathroom;Assistance with cooking/housework;Assist for transportation     Functional Status Assessment   Patient has not had a recent decline in their functional status     Equipment Recommendations   None recommended by OT     Recommendations for Other Services         Precautions/Restrictions   Precautions Precautions: Fall Recall of Precautions/Restrictions: Intact Restrictions Weight Bearing Restrictions Per Provider Order: No     Mobility Bed Mobility               General bed mobility comments: up working with PT Patient Response: Cooperative  Transfers Overall transfer level: Needs assistance Equipment used: Rollator (4 wheels) Transfers: Sit to/from Stand, Bed to chair/wheelchair/BSC Sit to Stand: Modified independent (Device/Increase time)     Step pivot transfers: Modified independent (Device/Increase time), Supervision     General transfer comment: Line management and cues to push from solid surface vs pulling from 4WRW      Balance Overall balance assessment: Needs assistance Sitting-balance support: Feet supported, No upper  extremity supported Sitting balance-Leahy Scale: Good     Standing balance support: Reliant on assistive device for balance Standing balance-Leahy Scale: Fair                             ADL either performed or assessed with clinical judgement   ADL Overall ADL's : At baseline                                             Vision Patient Visual Report: No change from baseline       Perception Perception: Not tested       Praxis Praxis: Not tested       Pertinent Vitals/Pain Pain Assessment Pain Assessment: No/denies pain Pain Intervention(s): Monitored during session     Extremity/Trunk Assessment Upper Extremity Assessment Upper Extremity Assessment: Overall WFL for tasks assessed   Lower Extremity Assessment Lower Extremity Assessment: Defer to PT evaluation   Cervical / Trunk Assessment Cervical / Trunk Assessment: Normal   Communication Communication Communication: No apparent difficulties   Cognition Arousal: Alert Behavior During Therapy: WFL for tasks assessed/performed Cognition: No apparent impairments                               Following commands: Intact       Cueing  General Comments   Cueing Techniques: Verbal cues  VSS on RA   Exercises     Shoulder Instructions      Home Living Family/patient  expects to be discharged to:: Private residence Living Arrangements: Alone Available Help at Discharge: Family;Friend(s);Available PRN/intermittently Type of Home: Independent living facility Home Access: Level entry     Home Layout: One level     Bathroom Shower/Tub: Producer, television/film/video: Handicapped height Bathroom Accessibility: Yes   Home Equipment: Grab bars - tub/shower;Rolling Walker (2 wheels);Cane - single point;Shower seat - built in;Grab bars - toilet;Hand held Orthoptist (4 wheels)   Additional Comments: 2 falls in last 6 months, has been using  rollator last 2 weeks, was receiving PT last 2 weeks and working on balance      Prior Functioning/Environment Prior Level of Function : Independent/Modified Independent;History of Falls (last six months)             Mobility Comments: using rollator last few weeks and was Modif I ADLs Comments: Ind active with ILF - goes out into the community.    OT Problem List: Impaired balance (sitting and/or standing)   OT Treatment/Interventions:        OT Goals(Current goals can be found in the care plan section)   Acute Rehab OT Goals Patient Stated Goal: Home OT Goal Formulation: With patient Time For Goal Achievement: 01/14/24 Potential to Achieve Goals: Good   OT Frequency:       Co-evaluation              AM-PAC OT 6 Clicks Daily Activity     Outcome Measure Help from another person eating meals?: None Help from another person taking care of personal grooming?: None Help from another person toileting, which includes using toliet, bedpan, or urinal?: A Little Help from another person bathing (including washing, rinsing, drying)?: A Little Help from another person to put on and taking off regular upper body clothing?: None Help from another person to put on and taking off regular lower body clothing?: A Little 6 Click Score: 21   End of Session Equipment Utilized During Treatment: Rollator (4 wheels);Gait belt Nurse Communication: Mobility status;Other (comment) (No chair alarm in room)  Activity Tolerance: Patient tolerated treatment well Patient left: in chair;with call bell/phone within reach  OT Visit Diagnosis: Unsteadiness on feet (R26.81)                Time: 1001-1014 OT Time Calculation (min): 13 min Charges:  OT General Charges $OT Visit: 1 Visit OT Evaluation $OT Eval Moderate Complexity: 1 Mod  01/10/2024  RP, OTR/L  Acute Rehabilitation Services  Office:  [phone]   Charlie JONETTA Halsted 01/10/2024, 10:24 AM

## 2024-01-10 NOTE — Progress Notes (Signed)
   01/10/24 1417  TOC Brief Assessment  Insurance and Status Reviewed  Patient has primary care physician Yes  Home environment has been reviewed ILF- [facility]  Prior level of function: Self w/ AD  Prior/Current Home Services No current home services (Pt going to in-house therapy at [facility]))  Social Drivers of Health Review SDOH reviewed no interventions necessary  Readmission risk has been reviewed Yes  Transition of care needs no transition of care needs at this time    Pt stable for transition back home to [facility]- pt already doing therapy at [facility] with their in-house therapy- [employer]. Pt wants to continue with this plan. No IP CM needs noted for discharge.

## 2024-01-10 NOTE — Consult Note (Signed)
 Cardiology Consultation   Patient ID: Donald [name] MRN: [id number]; DOB: [birth year]  Admit date: 01/09/2024 Date of Consult: 01/10/2024  PCP:  Vernadine Charlie ORN, MD   [facility] Providers Cardiologist:  Redell Shallow, MD        Patient Profile: Donald [name] is a 89 y.o. male with a history of CAD s/p remote CABG x2 (LIMA-LAD and SVG-Diag) in 1990 with subsequent PTCA/ stenting with distal of left main/ ostial LCx in 2004, ischemic cardiomyopathy/ chronic HFrEF with EF of 40-45% on recent Echo in 11/2023, paroxysmal atrial fibrillation/ flutter on Eliquis , post infarction apical thrombus, moderate to severe mitral regurgitation, PAD with known oclusion of poplitieal artery noted on incidentally on venous dopplers in 07/2017, CVA, hyperlipidemia, and prostate cancer who is being seen on 01/10/2024 to assist with recommendations regarding Eliquis  at the request of Dr. Serene.  History of Present Illness: Donald [name] is a 89 year old male with the above history who is followed Dr. Shallow.  She has a history of CAD s/p remote CABG in 1990 with subsequent PTCA/ stenting with distal of left main/ ostial LCx in 2004 as well as ischemic cardiomyopathy with prior neuro apical thrombus.  Last ischemic evaluation was a Myoview  in 11/2016 which showed no ischemia. EF 46% at that time. EF has ranged from 35-45% on Echos over the last several years. He also had a history of atrial fibrillation/ flutter for which he is on Amiodarone .   He was recently admitted from 12/25/2023 to 12/27/2023 after a syncopal episode that resulted in a fall and fracture of anterior inferior C6 vertebral. He does have a history of orthostatic hypotension but orthostatics were negative during admission and history did not sound consistent with this. Echo showed LVEF of 40-45% with akinesis of the distal septum and apex with overall mild to moderate LV dysfunction and grade 1 diastolic dysfunciton, normal RV  function, severe left atrium enlargement, mild MR, and moderate to severe TR. C spine fracture was treated conservatively. Eliquis  was stopped at discharged given concern that his fall and bleeding risk now outweighed his stroke risk.  He presented to the ED on 01/09/2024 for further evaluation of numbness/ discomfort of his right leg. When EMS arrived, no pedal pulses could be found and his right foot was cold to the touch. CTA showed right common femoral artery occlusion just proximal to its bifurcation with abrupt thormbus and underlyign atherosclerosis as well as atherosclerotic occlusion fo the distal left profunda femoral artery. He was started on IV Heparin . Vascular Surgery was consulted and he was taken to the OR for open right femoral artery exposure and right common femoral, profundofemoral, and femoral-popliteal thrombectomy.  Cardiology consulted to assist with recommendations for anticoagulation going forward.  He has done well since recent discharge. He denies any recurrent falls or near syncope/ syncope. No chest pain, shortness of breath, orthopena, PND, edema, recent palpitations, lightheadeness/ dizziness. In regards to his syncopal event last month, he states there was initially question about what came first - the fall or the syncopal event. He states he talked to 3 friends who were with him at the time of his fall/ syncopal event after recent discharge and they all stated that he tripped on the carpet and then fell and hit his head.    Past Medical History:  Diagnosis Date   Asthma    with worsening with beta blockade   Atrial fibrillation (HCC)    CAD (coronary artery disease)  CVA (cerebral infarction) 2012   tia, mild no residual defecits   GI bleed 8 yrs ago   due to doll fully vessel which was clipped   Glaucoma    excellent cataracts   History of kidney stones    Ischemic cardiomyopathy    Moderate to severe mitral regurgitation 06/16/2017   Nephrolithiasis     Post-infarction apical thrombus [facility])    Prostate cancer [facility]) 2012   Radiation proctitis Feb 2015   treated with APC   Tubular adenoma 04/2013   Dr. Albertus    Past Surgical History:  Procedure Laterality Date   cardiac stents  2003   CARDIOVERSION N/A 06/15/2017   Procedure: CARDIOVERSION;  Surgeon: Alveta Aleene PARAS, MD;  Location: [facility] ENDOSCOPY;  Service: Cardiovascular;  Laterality: N/A;   CARDIOVERSION N/A 07/15/2017   Procedure: CARDIOVERSION;  Surgeon: Jeffrie Oneil BROCKS, MD;  Location: [facility] ENDOSCOPY;  Service: Cardiovascular;  Laterality: N/A;   CARDIOVERSION N/A 07/20/2017   Procedure: CARDIOVERSION;  Surgeon: Charls Pearla LABOR, MD;  Location: [facility] ENDOSCOPY;  Service: Cardiovascular;  Laterality: N/A;   CARDIOVERSION N/A 11/23/2017   Procedure: CARDIOVERSION;  Surgeon: Pietro Redell RAMAN, MD;  Location: [facility] ENDOSCOPY;  Service: Cardiovascular;  Laterality: N/A;   CARDIOVERSION N/A 06/03/2021   Procedure: CARDIOVERSION;  Surgeon: Kate Lonni CROME, MD;  Location: [facility] ENDOSCOPY;  Service: Cardiovascular;  Laterality: N/A;   CARDIOVERSION N/A 04/03/2022   Procedure: CARDIOVERSION;  Surgeon: Francyne Headland, MD;  Location: [facility] ENDOSCOPY;  Service: Cardiovascular;  Laterality: N/A;   COLONOSCOPY WITH PROPOFOL  N/A 05/05/2013   Procedure: COLONOSCOPY WITH PROPOFOL ;  Surgeon: Gordy CHRISTELLA Albertus, MD;  Location: [facility] ENDOSCOPY;  Service: Gastroenterology;  Laterality: N/A;   CORONARY ARTERY BYPASS GRAFT  1998   LIMA to the LAD and saphenous vein graft tothe diagonal   EXTRACORPOREAL SHOCK WAVE LITHOTRIPSY Right 04/29/2017   Procedure: RIGHT EXTRACORPOREAL SHOCK WAVE LITHOTRIPSY (ESWL);  Surgeon: Alvaro Hummer, MD;  Location: [facility] ORS;  Service: Urology;  Laterality: Right;   history of radiation treatment  2012   x 40 treatments done   KNEE ARTHROSCOPY  2016 or 2017   Dr Heide ;    PARTIAL KNEE ARTHROPLASTY Right 01/13/2017   Procedure: UNICOMPARTMENTAL RIGHT KNEE;  Surgeon: Melodi Lerner, MD;   Location: [facility] ORS;  Service: Orthopedics;  Laterality: Right;  with block   THROMBECTOMY FEMORAL ARTERY Right 01/09/2024   Procedure: RIGHT FEMORAL ARTERY THROMBECTOMY;  Surgeon: Serene Gaile ORN, MD;  Location: [facility] OR;  Service: Vascular;  Laterality: Right;     Home Medications:  Prior to Admission medications   Medication Sig Start Date End Date Taking? Authorizing Provider  alendronate (FOSAMAX) 70 MG tablet Take 70 mg by mouth every Sunday. Take with a full glass of water  on an empty stomach.   Yes [provider]  amiodarone  (PACERONE ) 100 MG tablet Take 1 tablet (100 mg total) by mouth daily. 06/22/23  Yes Pietro Redell RAMAN, MD  apixaban  (ELIQUIS ) 5 MG TABS tablet Take 5 mg by mouth 2 (two) times daily.   Yes [provider]  Cholecalciferol  (VITAMIN D ) 2000 UNITS tablet Take 2,000 Units by mouth daily.   Yes [provider]  furosemide  (LASIX ) 40 MG tablet Take 1 tablet (40 mg total) by mouth every Monday, Wednesday, and Friday. You can take extra tablet of Lasix  40 mg if needed for shortness of breath or leg swelling 12/27/23  Yes Lama, Sabas RAMAN, MD  levothyroxine  (SYNTHROID ) 75 MCG tablet Take 75 mcg by mouth  daily before breakfast. 01/11/23  Yes [provider]  loratadine  (CLARITIN ) 10 MG tablet Take 10 mg by mouth daily.   Yes [provider]  Multiple Vitamin (MULTIVITAMIN WITH MINERALS) TABS tablet Take 1 tablet by mouth daily.   Yes [provider]  pravastatin  (PRAVACHOL ) 40 MG tablet TAKE 1 TABLET EVERY EVENING 03/25/21  Yes Burnard Debby LABOR, MD  tamsulosin  (FLOMAX ) 0.4 MG CAPS capsule Take 0.8 mg by mouth 2 (two) times daily.   Yes [provider]  aspirin  EC 81 MG tablet Take 1 tablet (81 mg total) by mouth daily at 6 (six) AM. Swallow whole. 01/11/24   Baglia, Corrina, PA-C  HYDROcodone -acetaminophen  (NORCO/VICODIN) 5-325 MG tablet Take 1-2 tablets by mouth every 6 (six) hours as needed for moderate pain (pain score 4-6)  or severe pain (pain score 7-10). Patient not taking: Reported on 01/09/2024 12/27/23   Drusilla Sabas RAMAN, MD  oxyCODONE  (OXY IR/ROXICODONE ) 5 MG immediate release tablet Take 1 tablet (5 mg total) by mouth every 6 (six) hours as needed for severe pain (pain score 7-10). 01/10/24   Baglia, Corrina, PA-C    Scheduled Meds:  amiodarone   100 mg Oral Daily   aspirin  EC  81 mg Oral Q0600   Chlorhexidine  Gluconate Cloth  6 each Topical Q0600   docusate sodium   100 mg Oral Daily   levothyroxine   75 mcg Oral QAC breakfast   pravastatin   40 mg Oral QPM   sodium chloride  flush  3 mL Intravenous Q12H   Continuous Infusions:  sodium chloride      heparin  1,100 Units/hr (01/09/24 2300)   PRN Meds: sodium chloride , acetaminophen  **OR** acetaminophen , bisacodyl , hydrALAZINE, HYDROmorphone  (DILAUDID ) injection, oxyCODONE , phenol, potassium chloride , senna-docusate, sodium chloride  flush  Allergies:    Allergies  Allergen Reactions   Beta Adrenergic Blockers Other (See Comments)    Other reaction(s): asthma exacerbation   Keflex [Cephalexin] Nausea And Vomiting and Other (See Comments)    'Killed all GI enzymes (also) and patient prefers to not take this   Morphine Nausea Only    Social History:   Social History   Socioeconomic History   Marital status: Widowed    Spouse name: Not on file   Number of children: 3   Years of education: Not on file   Highest education level: Not on file  Occupational History   Occupation: Retired Equities trader  Tobacco Use   Smoking status: Never   Smokeless tobacco: Never  Vaping Use   Vaping status: Never Used  Substance and Sexual Activity   Alcohol  use: Yes    Alcohol /week: 0.0 standard drinks of alcohol     Comment: occasional beer or wine   Drug use: No   Sexual activity: Not Currently    Birth control/protection: Abstinence  Other Topics Concern   Not on file  Social History Narrative   Not on file   Social Drivers of Health   Financial  Resource Strain: Not on file  Food Insecurity: No Food Insecurity (01/09/2024)   Hunger Vital Sign    Worried About Running Out of Food in the Last Year: Never true    Ran Out of Food in the Last Year: Never true  Transportation Needs: No Transportation Needs (01/09/2024)   PRAPARE - Administrator, Civil Service (Medical): No    Lack of Transportation (Non-Medical): No  Physical Activity: Not on file  Stress: Not on file  Social Connections: Moderately Integrated (01/09/2024)   Social Connection and Isolation Panel  Frequency of Communication with Friends and Family: More than three times a week    Frequency of Social Gatherings with Friends and Family: More than three times a week    Attends Religious Services: More than 4 times per year    Active Member of [employer] or Organizations: Yes    Attends Banker Meetings: More than 4 times per year    Marital Status: Widowed  Intimate Partner Violence: Not At Risk (01/09/2024)   Humiliation, Afraid, Rape, and Kick questionnaire    Fear of Current or Ex-Partner: No    Emotionally Abused: No    Physically Abused: No    Sexually Abused: No    Family History:   Family History  Problem Relation Age of Onset   Heart attack Father    Heart disease Mother    Stomach cancer Mother      ROS:  Please see the history of present illness.     Physical Exam/Data: Vitals:   01/10/24 0400 01/10/24 0600 01/10/24 0753 01/10/24 1108  BP: (!) 101/57 99/61 (!) 97/59 (!) 96/59  Pulse: 69 69 62 61  Resp: 15 16 15 16   Temp: 98.9 F (37.2 C)  97.6 F (36.4 C) (!) 97.4 F (36.3 C)  TempSrc: Oral  Oral Oral  SpO2: 95% 100% 94% 96%  Weight:      Height:        Intake/Output Summary (Last 24 hours) at 01/10/2024 1517 Last data filed at 01/10/2024 1154 Gross per 24 hour  Intake 557.07 ml  Output 1130 ml  Net -572.93 ml      01/09/2024    8:58 AM 01/09/2024    6:29 AM 12/27/2023    5:54 AM  Last 3 Weights  Weight  (lbs) 154 lb 154 lb 164 lb 14.5 oz  Weight (kg) 69.854 kg 69.854 kg 74.8 kg     Body mass index is 22.1 kg/m.  General: 89 y.o. Caucasian male resting comfortably in no acute distress. HEENT: Normocephalic and atraumatic. Sclera clear.  Neck: Supple.No JVD. Heart: RRR. Soft I-II/ VI systolic murmur.  Lungs: No increased work of breathing. Clear to ausculation bilaterally. No wheezes, rhonchi, or rales.  Abdomen: Soft, non-distended, and non-tender to palpation.  Extremities: No lower extremity edema.    Skin: Warm and dry. Neuro: Alert and oriented x3. No focal deficits. Psych: Normal affect. Responds appropriately.  EKG:  The EKG was personally reviewed and demonstrates:  No new ECG tracing this admission. Telemetry:  Telemetry was personally reviewed and demonstrates:  Normal sinus rhythm.  Relevant CV Studies:  Echocardiogram 12/06/2023: Impressions: 1. Akinesis of the distal septum and apex with overall mild to moderate  LV dysfunction; trileaflet aortic valve with fixed noncoronary cusp with  likely mild AS (mean gradient 7.5 mmHg, DI 0.38, AVA 1.4 cm2); swirling  noted at LV apex but no definite  thrombus.   2. Left ventricular ejection fraction, by estimation, is 40 to 45%. The  left ventricle has mildly decreased function. The left ventricle  demonstrates regional wall motion abnormalities (see scoring  diagram/findings for description). Left ventricular  diastolic parameters are consistent with Grade I diastolic dysfunction  (impaired relaxation).   3. Right ventricular systolic function is normal. The right ventricular  size is mildly enlarged.   4. Left atrial size was severely dilated.   5. The mitral valve is normal in structure. Mild mitral valve  regurgitation. No evidence of mitral stenosis.   6. Tricuspid valve regurgitation is moderate to  severe.   7. The aortic valve is normal in structure. Aortic valve regurgitation is  trivial. No aortic stenosis is  present.   8. The inferior vena cava is normal in size with greater than 50%  respiratory variability, suggesting right atrial pressure of 3 mmHg.    Laboratory Data: High Sensitivity Troponin:   Recent Labs  Lab 12/25/23 2220 12/26/23 0032  TROPONINIHS 12 13     Chemistry Recent Labs  Lab 01/09/24 0653 01/10/24 0427  NA 140 135  K 3.4* 3.5  CL 95* 97*  CO2 29 26  GLUCOSE 140* 133*  BUN 19 23  CREATININE 0.93 1.19  CALCIUM 8.9 8.8*  GFRNONAA >60 56*  ANIONGAP 16* 12    No results for input(s): PROT, ALBUMIN, AST, ALT, ALKPHOS, BILITOT in the last 168 hours. Lipids  Recent Labs  Lab 01/10/24 0427  CHOL 97  TRIG 33  HDL 49  LDLCALC 41  CHOLHDL 2.0    Hematology Recent Labs  Lab 01/09/24 0653 01/10/24 0427  WBC 7.5 14.6*  RBC 4.06* 3.43*  HGB 12.9* 11.0*  HCT 39.9 32.9*  MCV 98.3 95.9  MCH 31.8 32.1  MCHC 32.3 33.4  RDW 13.6 13.8  PLT 263 221   Thyroid  No results for input(s): TSH, FREET4 in the last 168 hours.  BNPNo results for input(s): BNP, PROBNP in the last 168 hours.  DDimer No results for input(s): DDIMER in the last 168 hours.   Assessment and Plan:  Ischemic Leg Patient was for an acute right ischemic leg and underwent open right femoral artery exposure with right common femoral, profundofemoral, and femoral-popliteal thrombectomy on 10/12.  He has a history of atrial fibrillation/flutter and anticoagulation was recently stopped about 2 weeks ago due to concerns about fall/bleeding risk. Cardiology now asked to provide new recommendations regarding anticoagulation.  - He was only off Eliquis  for 2 weeks before he had a thrombotic event. Therefore, I think the risk of remaining off Eliquis  outweigh the bleed risk. Will plan to restart Eliquis  5mg  twice daily. He is now ambulating with a walker and feels much more steady on his feet. - There was previous discussion about Watchman procedure but patient would like to avoid any  unnecessary procedures if possible.  Recent Fall/ Syncope He was recently admitted about 2 weeks ago for a fall/ syncope. There was question of what came first - the fall or the syncopal event. There was initially concerns that he had a syncopal episode and then fell but patient states he talked with three friends who were with him when he fell and all of them said he tripped on the carpet and then fell. So it sounds like the syncopal episode actually occurred after he fell and hit is head.  - No recurrence.  - Outpatient monitor was ordered after last admission. This unfortunately got taken off and thrown away when he presented to the ED this time. Given new details about his fall, think we can hold off on repeat monitor.   CAD S/p remote CABG in 1990 and subsequent PCI in 2004. - No chest pain.  - Currently on Aspirin  81mg  daily but can stop this if DOAC is resumed. - Continue Pravastatin  40mg  daily.  Ischemic Cardiomyopathy Chronic HFrEF Recent Echo in 11/2023 showed LVEF of 40-45% with akinesis of the distal septum and apex with overall mild to moderate LV dysfunction and grade 1 diastolic dysfunciton, normal RV function, severe left atrium enlargement, mild MR, and moderate to severe  TR. - Euvolemic on exam.  - Not on daily diuretic or GDMT given soft BP at baseline and history of orthostatic hypotension.  Paroxysmal Atrial Fibrillation/ Flutter He was in atrial flutter during recent admission in 11/2023 but currently back in normal sinus rhythm. - Continue Amiodarone  100mg  daily. - Not on any AV nodal agents given soft BP at baseline history of orthostatic hypotension. - CHA2DS-VASc = 6 (CAD, CHF, CVA x2, age x2). Eliquis  was held during last admission but will plan to restart given acute thrombotic event leading to an ischemic leg as above.  Moderate to Severe Mitral Regurgitation Noted on Echo in 11/2023. - No plans for surgery.  Hyperlipidemia - Continue Pravastatin  40mg   daily.  Risk Assessment/Risk Scores:   New [place]  Heart Association (NYHA) Functional Class NYHA Class I  CHA2DS2-VASc Score = 6  This indicates a 9.7% annual risk of stroke. The patient's score is based upon: CHF History: 1 HTN History: 0 Diabetes History: 0 Stroke History: 2 Vascular Disease History: 1 Age Score: 2 Gender Score: 0   For questions or updates, please contact [facility] Please consult [email or web address] for contact info under     Signed, Galileo Colello E Lavaris Sexson, PA-C  01/10/2024 3:17 PM

## 2024-01-10 NOTE — Evaluation (Signed)
 Physical Therapy Evaluation Patient Details Name: Donald [name] MRN: [id number] DOB: 12-16-1926 Today's Date: 01/10/2024  History of Present Illness  89 y.o. male  presented 01/09/24 for critical limb ischemia, s/p Right common femoral, profundofemoral, and femoral-popliteal thrombectomy.  PMH paroxysmal atrial fibrillation, coronary artery disease, ischemic cardiomyopathy, history of prostate cancer, prior CVA.  Clinical Impression  Pt admitted with above diagnosis. Pt was able to ambulate with Rollator without physical assist and no LOB.  Does well with rollator and daughter plans to take pt back to I living facility today. Pt can continue therapy at I living facility - HHPT/Outpt PT whichever is available.  Pt currently with functional limitations due to the deficits listed below (see PT Problem List). Pt will benefit from acute skilled PT to increase their independence and safety with mobility to allow discharge.           If plan is discharge home, recommend the following: Assistance with cooking/housework;Assist for transportation   Can travel by private vehicle        Equipment Recommendations None recommended by PT  Recommendations for Other Services       Functional Status Assessment Patient has had a recent decline in their functional status and demonstrates the ability to make significant improvements in function in a reasonable and predictable amount of time.     Precautions / Restrictions Precautions Precautions: Fall Recall of Precautions/Restrictions: Intact Restrictions Weight Bearing Restrictions Per Provider Order: No      Mobility  Bed Mobility Overal bed mobility: Needs Assistance Bed Mobility: Rolling, Sidelying to Sit Rolling: Supervision Sidelying to sit: Supervision            Transfers Overall transfer level: Needs assistance Equipment used: Rollator (4 wheels) Transfers: Sit to/from Stand, Bed to chair/wheelchair/BSC Sit to Stand:  Supervision           General transfer comment: No assist to stand up to rollator    Ambulation/Gait Ambulation/Gait assistance: Supervision Gait Distance (Feet): 250 Feet Assistive device: Rollator (4 wheels) Gait Pattern/deviations: Step-through pattern, Decreased stride length, Trunk flexed, Drifts right/left Gait velocity: dec Gait velocity interpretation: <1.8 ft/sec, indicate of risk for recurrent falls   General Gait Details: Pt most stable using rollator and pt is aware.  No overt LOB. Without device pt gait deviations notably exacerbated.  Pt recently purchased a rollator and states he will use at all times.  Stairs            Wheelchair Mobility     Tilt Bed    Modified Rankin (Stroke Patients Only)       Balance Overall balance assessment: Needs assistance Sitting-balance support: Feet supported, No upper extremity supported Sitting balance-Leahy Scale: Good     Standing balance support: Reliant on assistive device for balance, Bilateral upper extremity supported, During functional activity Standing balance-Leahy Scale: Poor Standing balance comment: relies on rollator for balance                             Pertinent Vitals/Pain Pain Assessment Pain Assessment: No/denies pain    Home Living Family/patient expects to be discharged to:: Private residence Living Arrangements: Alone Available Help at Discharge: Family;Friend(s);Available PRN/intermittently Type of Home: Independent living facility Home Access: Level entry       Home Layout: One level Home Equipment: Grab bars - tub/shower;Rolling Walker (2 wheels);Cane - single point;Shower seat - built in;Grab bars - toilet;Hand held Orthoptist (4 wheels) Additional  Comments: 2 falls in last 6 months, has been using rollator last 2 weeks, was receiving PT last 2 weeks and working on balance. Lives at [facility]    Prior Function Prior Level of Function :  Independent/Modified Independent;History of Falls (last six months)             Mobility Comments: using rollator last few weeks and was Modif I ADLs Comments: Ind active with ILF - goes out into the community.     Extremity/Trunk Assessment   Upper Extremity Assessment Upper Extremity Assessment: Defer to OT evaluation    Lower Extremity Assessment Lower Extremity Assessment: Generalized weakness    Cervical / Trunk Assessment Cervical / Trunk Assessment: Normal  Communication   Communication Communication: No apparent difficulties Factors Affecting Communication: Hearing impaired    Cognition Arousal: Alert Behavior During Therapy: WFL for tasks assessed/performed   PT - Cognitive impairments: No apparent impairments                         Following commands: Intact       Cueing Cueing Techniques: Verbal cues     General Comments General comments (skin integrity, edema, etc.): 77 bpm    Exercises     Assessment/Plan    PT Assessment Patient needs continued PT services  PT Problem List Decreased activity tolerance;Decreased balance;Decreased mobility;Decreased knowledge of use of DME;Decreased knowledge of precautions       PT Treatment Interventions DME instruction;Functional mobility training;Therapeutic activities;Therapeutic exercise;Balance training;Patient/family education;Gait training    PT Goals (Current goals can be found in the Care Plan section)  Acute Rehab PT Goals Patient Stated Goal: return to prior activities. PT Goal Formulation: With patient Time For Goal Achievement: 01/24/24 Potential to Achieve Goals: Good    Frequency Min 2X/week     Co-evaluation               AM-PAC PT 6 Clicks Mobility  Outcome Measure Help needed turning from your back to your side while in a flat bed without using bedrails?: None Help needed moving from lying on your back to sitting on the side of a flat bed without using  bedrails?: None Help needed moving to and from a bed to a chair (including a wheelchair)?: A Little Help needed standing up from a chair using your arms (e.g., wheelchair or bedside chair)?: A Little Help needed to walk in hospital room?: A Little Help needed climbing 3-5 steps with a railing? : A Lot 6 Click Score: 19    End of Session Equipment Utilized During Treatment: Gait belt Activity Tolerance: Patient tolerated treatment well Patient left: in chair;with call bell/phone within reach;with chair alarm set;with family/visitor present Nurse Communication: Mobility status PT Visit Diagnosis: Other abnormalities of gait and mobility (R26.89);Muscle weakness (generalized) (M62.81);History of falling (Z91.81)    Time: 0948-1000 PT Time Calculation (min) (ACUTE ONLY): 12 min   Charges:   PT Evaluation $PT Eval Moderate Complexity: 1 Mod   PT General Charges $$ ACUTE PT VISIT: 1 Visit         Braelynne Garinger M,PT Acute Rehab Services [phone]   Stephane JULIANNA Bevel 01/10/2024, 11:55 AM

## 2024-01-10 NOTE — Discharge Instructions (Signed)
[facility]  Discharge instructions  Lower Extremity Vascular Surgery  Please refer to the following instruction for your post-procedure care. Your surgeon or physician assistant will discuss any changes with you.  Activity  You are encouraged to walk as much as you can. You can slowly return to normal activities during the month after your surgery. Avoid strenuous activity and heavy lifting until your doctor tells you it's OK. Avoid activities such as vacuuming or swinging a golf club. Do not drive until your doctor give the OK and you are no longer taking prescription pain medications. It is also normal to have difficulty with sleep habits, eating and bowel movement after surgery. These will go away with time.  Bathing/Showering  Shower daily after you go home. Do not soak in a bathtub, hot tub, or swim until the incision heals completely.  Incision Care  Clean your incision with mild soap and water. Shower every day. Pat the area dry with a clean towel. You do not need a bandage unless otherwise instructed. Do not apply any ointments or creams to your incision. If you have open wounds you will be instructed how to care for them or a visiting nurse may be arranged for you. If you have staples or sutures along your incision they will be removed at your post-op appointment. You may have skin glue on your incision. Do not peel it off. It will come off on its own in about one week.  Wash the groin wound with soap and water daily and pat dry. (No tub bath-only shower)  Then put a dry gauze or washcloth in the groin to keep this area dry to help prevent wound infection.  Do this daily and as needed.  Do not use Vaseline or neosporin on your incisions.  Only use soap and water on your incisions and then protect and keep dry.  Diet  Resume your normal diet. There are no special food restrictions following this procedure. A low fat/ low cholesterol diet is  recommended for all patients with vascular disease. In order to heal from your surgery, it is CRITICAL to get adequate nutrition. Your body requires vitamins, minerals, and protein. Vegetables are the best source of vitamins and minerals. Vegetables also provide the perfect balance of protein. Processed food has little nutritional value, so try to avoid this.  Medications  Resume taking all your medications unless your doctor or physician assistant tells you not to. If your incision is causing pain, you may take over-the-counter pain relievers such as acetaminophen (Tylenol). If you were prescribed a stronger pain medication, please aware these medication can cause nausea and constipation. Prevent nausea by taking the medication with a snack or meal. Avoid constipation by drinking plenty of fluids and eating foods with high amount of fiber, such as fruits, vegetables, and grains. Take Colace 100 mg (an over-the-counter stool softener) twice a day as needed for constipation.  Do not take Tylenol if you are taking prescription pain medications.  Follow Up  Our office will schedule a follow up appointment 2-3 weeks following discharge.  Please call us immediately for any of the following conditions  .Severe or worsening pain in your legs or feet while at rest or while walking .Increase pain, redness, warmth, or drainage (pus) from your incision site(s) . Fever of 101 degree or higher . The swelling in your leg with the bypass suddenly worsens and becomes more painful than when you were in the hospital .  If you have been instructed to feel your graft pulse then you should do so every day. If you can no longer feel this pulse, call the office immediately. Not all patients are given this instruction. .  Leg swelling is common after leg bypass surgery.  The swelling should improve over a few months following surgery. To improve the swelling, you may elevate your legs above the level of your heart while  you are sitting or resting. Your surgeon or physician assistant may ask you to apply an ACE wrap or wear compression (TED) stockings to help to reduce swelling.  Reduce your risk of vascular disease  Stop smoking. If you would like help call [facility] at 1-800-QUIT-NOW 570-542-2068) or [place] at [phone].  . Manage your cholesterol . Maintain a desired weight . Control your diabetes weight . Control your diabetes . Keep your blood pressure down .  If you have any questions, please call the office at [phone]

## 2024-01-10 NOTE — Progress Notes (Signed)
 Pt is alert and fully oriented x 4, pleasant, afebrile, stable hemodynamically, NSR on the monitor, normal respiratory effort. No acute distress noted. Pt is able to rest well with no major complaints overnight.   Right groin is soft, the incision with skin glue is dry and clean, negative for bleeding or hematoma. Right foot is warm to touch equally to the left foot. Palpable 2+ DP and PT pulses bilaterally. Doppler brisk signals on both legs.  Continue Heparin  gtt at 1100 units/hr. Pharmacist follows the therapeutic level.   Plan of care is reviewed. Pt has been progressing. We will continue to monitor.   Wendi Dash, RN

## 2024-01-10 NOTE — Consult Note (Deleted)
 Patient seen and examined, note reviewed with the signed Resident Physician/Advanced Practice Provider. I personally reviewed laboratory data, imaging studies and relevant notes. I independently examined the patient and formulated the important aspects of the plan. I have personally discussed the plan with the patient and/or family. Comments or changes to the note/plan are indicated below.  Donald [name] is a 89 y.o. year-old male with history of CAD status post CABG x 2 (1990), HFrEF last EF 40 to 45% with prior LV thrombus, mild MR, moderate to severe TR, A-fib status post DCCV (03/2022) on Eliquis , PAD with known occluded right popliteal artery presenting with acute limb ischemia of right CFA status post thrombectomy.  Cardiology consulted for anticoagulation recommendation.  Exam:  General: Well nourished, well developed, in no acute distress HEENT: Supple, no JVD Cardiac: Normal S1, S2; RRR; no murmurs, rubs, or gallops Lungs: CTAB w/ no wheezing, rhonchi or rales  Abd: Soft, nontender, no hepatomegaly  Ext: No palpable pulses; incision in right grown looks clean/dry/intact Skin: Warm and dry, no rashes   Neuro: AOA x 3  Assessment & Plan: #CAD status post CABG #HFrEF 40 to 45% with prior LV thrombus #Mild MR #Moderate to severe TR #A-fib/A-flutter status post DCCV January 2024 on Eliquis  #PAD with known occluded right popliteal artery - Presented with acute limb ischemia of right CFA in the setting of cardioembolic from his atrial fibrillation.  He has been off anticoagulation for about a week due to recent fall and concern for high risk of bleeding.  Plan was for event monitor to quantify his A-fib burden to dictate his need for anticoagulation.  I suspect that his thromboembolic risk is high regardless of his A-fib burden. - We can resume his home Eliquis .  I discussed with the patient and his daughter that I think we should strongly consider watchman.  He is at high risk of both  bleeding and thromboembolic events.  We will place referral to EP. - Can stop aspirin  once Eliquis  has been resumed. - Continue Amio 100 mg daily, Lasix  40 mg Monday Wednesday Friday, and pravastatin  40 mg daily. - NoGDMT given issue with orthostatic hypotension.  Signed, Joelle HUNT Ren Ny, MD [place]  [facility]  01/10/2024 3:13 PM

## 2024-01-11 ENCOUNTER — Other Ambulatory Visit: Payer: Self-pay | Admitting: Surgery

## 2024-01-11 DIAGNOSIS — R2681 Unsteadiness on feet: Secondary | ICD-10-CM | POA: Diagnosis not present

## 2024-01-11 DIAGNOSIS — R55 Syncope and collapse: Secondary | ICD-10-CM | POA: Diagnosis not present

## 2024-01-11 DIAGNOSIS — I70221 Atherosclerosis of native arteries of extremities with rest pain, right leg: Secondary | ICD-10-CM

## 2024-01-11 DIAGNOSIS — I70201 Unspecified atherosclerosis of native arteries of extremities, right leg: Secondary | ICD-10-CM

## 2024-01-11 DIAGNOSIS — M6281 Muscle weakness (generalized): Secondary | ICD-10-CM | POA: Diagnosis not present

## 2024-01-11 LAB — SURGICAL PATHOLOGY

## 2024-01-12 DIAGNOSIS — M6281 Muscle weakness (generalized): Secondary | ICD-10-CM | POA: Diagnosis not present

## 2024-01-12 DIAGNOSIS — R55 Syncope and collapse: Secondary | ICD-10-CM | POA: Diagnosis not present

## 2024-01-12 DIAGNOSIS — R2681 Unsteadiness on feet: Secondary | ICD-10-CM | POA: Diagnosis not present

## 2024-01-12 NOTE — Discharge Summary (Signed)
 Discharge Summary Patient ID: Donald [name] [id number] 89 y.o. 04/10/1926  Admit date: 01/09/2024  Discharge date and time: 01/10/2024  5:06 PM   Admitting Physician: Gaile LELON New, MD   Discharge Physician: Gaile LELON New, MD  Admission Diagnoses: History of thrombectomy [Z98.890, Z86.718] Critical limb ischemia of right lower extremity [facility]) [I70.221]  Discharge Diagnoses: History of thrombectomy [Z98.890, Z86.718] Critical limb ischemia of right lower extremity (HCC) [I70.221]  Admission Condition: serious  Discharged Condition: good  Indication for Admission: This is a 89 year old gentleman who developed right leg pain and numbness earlier this evening.  He was found to be without Doppler signals in his leg.  He was taken for CT scan that shows femoral occlusion.  He had recently had a fall and was taken off of his anticoagulation for his A-fib.  We discussed going to the operating room for thrombectomy.  We also talked about possible delayed fasciotomy if indicated   Hospital Course: Mr. Viner presented to ER via EMS with critical limb ischemia of right lower extremity. He was started on IV Heparin . He was taken emergently to the operating room for #1: Open right femoral artery exposure #2: Right common femoral, profundofemoral, and femoral-popliteal thrombectomy by Dr. New. He tolerated the surgery well and was taken to the recovery room in stable condition.  IV heparin  was continued post operatively. He progressed well and was taken to the 4E floor in stable condition. Brisk doppler signals in right foot remained present  POD#1, he did well overnight. Right groin incision clean, dry and intact. Brisk doppler PT/DP and peroneal signals. Motor and sensation intact in right foot. PT/OT evaluated patient and no follow up recommended. We consulted Cardiology for their recommendations on resuming of Eliquis . They cleared him to resume Eliquis . They feel he should consider  undergoing watchman procedure for his paroxysmal atrial fibrillation due to his high risk for thromboembolic event as well as increased fall risk. They will arrange outpatient Cardiology follow up to discuss this further. He remained stable for discharge home. He will return to [facility] where he does outpatient PT. He will follow up in our office in 2-3 weeks for incision check.   Consults: cardiology  Treatments: antibiotics: Ancef, analgesia: acetaminophen , anticoagulation: heparin  and Eliquis , therapies: PT, OT, RN, and SW, and surgery:  #1: Open right femoral artery exposure #2: Right common femoral, profundofemoral, and femoral-popliteal thrombectomy   Disposition: Discharge disposition: 01-Home or Self Care       - For [facility] use ---  Post-op:  Wound infection: No  Graft infection: No  Transfusion: No  If yes, 0 units given New Arrhythmia: No Patency judged by: [ X] Dopper only, [ ]  Palpable graft pulse, [ ]  Palpable distal pulse, [ ]  ABI inc. > 0.15, [ ]  Duplex D/C Ambulatory Status: Ambulatory  Complications: MI: Galerius.Gant ] No, [ ]  Troponin only, [ ]  EKG or Clinical CHF: No Resp failure: [ X] none, [ ]  Pneumonia, [ ]  Ventilator Chg in renal function: Galerius.Gant ] none, [ ]  Inc. Cr > 0.5, [ ]  Temp. Dialysis, [ ]  Permanent dialysis Stroke: [ X] None, [ ]  Minor, [ ]  Major Return to OR: No  Reason for return to OR: [ ]  Bleeding, [ ]  Infection, [ ]  Thrombosis, [ ]  Revision  Discharge medications: Statin use:  Yes ASA use:  No  for medical reason high bleed risk and fall risk Plavix use:  No  for medical reason high bleed risk and fall  risk Beta blocker use: No  for medical reason not indicated Coumadin  use: No  for medical reason on Eliquis , high bleed risk and fall risk    Patient Instructions:  Allergies as of 01/10/2024       Reactions   Beta Adrenergic Blockers Other (See Comments)   Other reaction(s): asthma exacerbation   Keflex [cephalexin] Nausea And Vomiting,  Other (See Comments)   'Killed all GI enzymes (also) and patient prefers to not take this   Morphine Nausea Only        Medication List     STOP taking these medications    HYDROcodone -acetaminophen  5-325 MG tablet Commonly known as: NORCO/VICODIN       TAKE these medications    alendronate 70 MG tablet Commonly known as: FOSAMAX Take 70 mg by mouth every Sunday. Take with a full glass of water  on an empty stomach.   amiodarone  100 MG tablet Commonly known as: Pacerone  Take 1 tablet (100 mg total) by mouth daily.   apixaban  5 MG Tabs tablet Commonly known as: ELIQUIS  Take 1 tablet (5 mg total) by mouth 2 (two) times daily.   furosemide  40 MG tablet Commonly known as: LASIX  Take 1 tablet (40 mg total) by mouth every Monday, Wednesday, and Friday. You can take extra tablet of Lasix  40 mg if needed for shortness of breath or leg swelling   levothyroxine  75 MCG tablet Commonly known as: SYNTHROID  Take 75 mcg by mouth daily before breakfast.   loratadine  10 MG tablet Commonly known as: CLARITIN  Take 10 mg by mouth daily.   multivitamin with minerals Tabs tablet Take 1 tablet by mouth daily.   oxyCODONE  5 MG immediate release tablet Commonly known as: Oxy IR/ROXICODONE  Take 1 tablet (5 mg total) by mouth every 6 (six) hours as needed for severe pain (pain score 7-10).   pravastatin  40 MG tablet Commonly known as: PRAVACHOL  TAKE 1 TABLET EVERY EVENING   tamsulosin  0.4 MG Caps capsule Commonly known as: FLOMAX  Take 0.8 mg by mouth 2 (two) times daily.   Vitamin D  50 MCG (2000 UT) tablet Take 2,000 Units by mouth daily.               Discharge Care Instructions  (From admission, onward)           Start     Ordered   01/10/24 0000  Discharge wound care:       Comments: Keep incision dry for 24 hours. You can then wash with mild soap and water , pat dry. Do not soak in bathtub, pool, etc   01/10/24 1316           Activity: activity as  tolerated, no driving while on analgesics, and no heavy lifting for 4 weeks Diet: cardiac diet and low fat, low cholesterol diet Wound Care: Keep incision dry for 24 hours. You can then wash with mild soap and water , pat dry. Do not soak in bathtub, pool, etc  Follow-up with [facility] in 2-3 weeks  Signed: Leaha Cuervo 01/12/2024 8:29 AM

## 2024-01-13 DIAGNOSIS — R2681 Unsteadiness on feet: Secondary | ICD-10-CM | POA: Diagnosis not present

## 2024-01-13 DIAGNOSIS — M6281 Muscle weakness (generalized): Secondary | ICD-10-CM | POA: Diagnosis not present

## 2024-01-13 DIAGNOSIS — R55 Syncope and collapse: Secondary | ICD-10-CM | POA: Diagnosis not present

## 2024-01-15 ENCOUNTER — Other Ambulatory Visit: Payer: Self-pay | Admitting: Cardiology

## 2024-01-19 ENCOUNTER — Telehealth: Payer: Self-pay | Admitting: Cardiology

## 2024-01-19 NOTE — Telephone Encounter (Signed)
 Abn normal reading

## 2024-01-19 NOTE — Telephone Encounter (Signed)
 Spoke with Dan at [facility] regarding an additional finding post final report. On 10/4 at 11:51 am the patient had new onset atrial flutter for 2 days and 2 hours. Will route to ordering provider. Final report available in chart.

## 2024-01-21 ENCOUNTER — Ambulatory Visit: Payer: Self-pay | Admitting: Student

## 2024-01-21 DIAGNOSIS — R55 Syncope and collapse: Secondary | ICD-10-CM | POA: Diagnosis not present

## 2024-01-21 NOTE — Telephone Encounter (Signed)
 Reviewed final monitor results. He was in atrial flutter continuously during the couple of days that he wore the monitor and average heart rate was 104 bpm. However, this was before his most recently admission from 01/09/2024 to 01/10/2024. He was in normal sinus rhythm at that time so no new recommendations at this time. He has a follow-up visit in the office next week.  Nihar Klus E Maribelle Hopple, PA-C 01/21/2024 4:00 PM

## 2024-01-24 DIAGNOSIS — R55 Syncope and collapse: Secondary | ICD-10-CM | POA: Diagnosis not present

## 2024-01-24 DIAGNOSIS — R2681 Unsteadiness on feet: Secondary | ICD-10-CM | POA: Diagnosis not present

## 2024-01-24 DIAGNOSIS — M6281 Muscle weakness (generalized): Secondary | ICD-10-CM | POA: Diagnosis not present

## 2024-01-25 ENCOUNTER — Ambulatory Visit: Admitting: Nurse Practitioner

## 2024-01-26 DIAGNOSIS — M6281 Muscle weakness (generalized): Secondary | ICD-10-CM | POA: Diagnosis not present

## 2024-01-26 DIAGNOSIS — R2681 Unsteadiness on feet: Secondary | ICD-10-CM | POA: Diagnosis not present

## 2024-01-26 DIAGNOSIS — R55 Syncope and collapse: Secondary | ICD-10-CM | POA: Diagnosis not present

## 2024-01-26 DIAGNOSIS — I130 Hypertensive heart and chronic kidney disease with heart failure and stage 1 through stage 4 chronic kidney disease, or unspecified chronic kidney disease: Secondary | ICD-10-CM | POA: Diagnosis not present

## 2024-01-26 DIAGNOSIS — Z86718 Personal history of other venous thrombosis and embolism: Secondary | ICD-10-CM | POA: Diagnosis not present

## 2024-01-26 DIAGNOSIS — I480 Paroxysmal atrial fibrillation: Secondary | ICD-10-CM | POA: Diagnosis not present

## 2024-01-26 DIAGNOSIS — Z7901 Long term (current) use of anticoagulants: Secondary | ICD-10-CM | POA: Diagnosis not present

## 2024-01-27 DIAGNOSIS — M6281 Muscle weakness (generalized): Secondary | ICD-10-CM | POA: Diagnosis not present

## 2024-01-27 DIAGNOSIS — R55 Syncope and collapse: Secondary | ICD-10-CM | POA: Diagnosis not present

## 2024-01-27 DIAGNOSIS — R2681 Unsteadiness on feet: Secondary | ICD-10-CM | POA: Diagnosis not present

## 2024-01-31 ENCOUNTER — Encounter: Payer: Self-pay | Admitting: Physician Assistant

## 2024-01-31 ENCOUNTER — Ambulatory Visit

## 2024-01-31 ENCOUNTER — Ambulatory Visit
Admission: RE | Admit: 2024-01-31 | Discharge: 2024-01-31 | Disposition: A | Discharge status: Home / Self Care | Source: Ambulatory Visit | Attending: Surgery | Admitting: Surgery

## 2024-01-31 VITALS — BP 123/80 | HR 83 | Temp 97.8°F | Wt 160.3 lb

## 2024-01-31 DIAGNOSIS — I70221 Atherosclerosis of native arteries of extremities with rest pain, right leg: Secondary | ICD-10-CM | POA: Insufficient documentation

## 2024-01-31 DIAGNOSIS — I70201 Unspecified atherosclerosis of native arteries of extremities, right leg: Secondary | ICD-10-CM | POA: Diagnosis not present

## 2024-01-31 DIAGNOSIS — M6281 Muscle weakness (generalized): Secondary | ICD-10-CM | POA: Diagnosis not present

## 2024-01-31 DIAGNOSIS — R2681 Unsteadiness on feet: Secondary | ICD-10-CM | POA: Diagnosis not present

## 2024-01-31 DIAGNOSIS — R55 Syncope and collapse: Secondary | ICD-10-CM | POA: Diagnosis not present

## 2024-01-31 LAB — VAS US ABI WITH/WO TBI
Left ABI: 1.27
Right ABI: 1.28

## 2024-01-31 NOTE — Progress Notes (Signed)
  POST OPERATIVE OFFICE NOTE    CC:  F/u for surgery  HPI:  This is a 89 y.o. male who is s/p right lower extremity thrombectomy by Dr. Serene on 01/09/2024 due to acute right leg ischemia.  He denies any rest pain in the right foot.  He also denies any motor or sensation deficit.  He believes his right groin incision is healing well.  He is scheduled to meet with an EP specialist to discuss Watchman procedure prior to discontinuing his DOAC.  Allergies  Allergen Reactions   Beta Adrenergic Blockers Other (See Comments)    Other reaction(s): asthma exacerbation   Keflex [Cephalexin] Nausea And Vomiting and Other (See Comments)    'Killed all GI enzymes (also) and patient prefers to not take this   Morphine Nausea Only    Current Outpatient Medications  Medication Sig Dispense Refill   alendronate (FOSAMAX) 70 MG tablet Take 70 mg by mouth every Sunday. Take with a full glass of water  on an empty stomach.     amiodarone  (PACERONE ) 100 MG tablet TAKE 1 TABLET BY MOUTH DAILY 90 tablet 2   apixaban  (ELIQUIS ) 5 MG TABS tablet Take 1 tablet (5 mg total) by mouth 2 (two) times daily. 60 tablet 11   Cholecalciferol  (VITAMIN D ) 2000 UNITS tablet Take 2,000 Units by mouth daily.     furosemide  (LASIX ) 40 MG tablet Take 1 tablet (40 mg total) by mouth every Monday, Wednesday, and Friday. You can take extra tablet of Lasix  40 mg if needed for shortness of breath or leg swelling 30 tablet 1   levothyroxine  (SYNTHROID ) 75 MCG tablet Take 75 mcg by mouth daily before breakfast.     loratadine  (CLARITIN ) 10 MG tablet Take 10 mg by mouth daily.     Multiple Vitamin (MULTIVITAMIN WITH MINERALS) TABS tablet Take 1 tablet by mouth daily.     oxyCODONE  (OXY IR/ROXICODONE ) 5 MG immediate release tablet Take 1 tablet (5 mg total) by mouth every 6 (six) hours as needed for severe pain (pain score 7-10). 12 tablet 0   pravastatin  (PRAVACHOL ) 40 MG tablet TAKE 1 TABLET EVERY EVENING 90 tablet 3   tamsulosin   (FLOMAX ) 0.4 MG CAPS capsule Take 0.8 mg by mouth 2 (two) times daily.     No current facility-administered medications for this visit.     ROS:  See HPI  Physical Exam:  Vitals:   01/31/24 1319  BP: 123/80  Pulse: 83  Temp: 97.8 F (36.6 C)  TempSrc: Temporal  Weight: 160 lb 4.8 oz (72.7 kg)    Incision: Right groin incision well-healed with fullness but no firm hematoma Extremities: Palpable right DP and PT pulse; palpable left DP Neuro: A&O  Assessment/Plan:  This is a 89 y.o. male who is s/p: Right leg thrombectomy due to acute limb ischemia  Right foot well-perfused on exam with palpable DP and PT pulses.  The right groin incision is healing well.  No restrictions from my surgical standpoint.  Patient is scheduled to follow-up with his EP specialist to discuss Watchman procedure.  He will notify the office with any questions or concerns but can follow-up on an as-needed basis.   Donnice Sender, PA-C [facility] [phone]  Clinic MD:  Serene

## 2024-02-01 ENCOUNTER — Ambulatory Visit: Admitting: Nurse Practitioner

## 2024-02-02 DIAGNOSIS — R55 Syncope and collapse: Secondary | ICD-10-CM | POA: Diagnosis not present

## 2024-02-02 DIAGNOSIS — R2681 Unsteadiness on feet: Secondary | ICD-10-CM | POA: Diagnosis not present

## 2024-02-02 DIAGNOSIS — M6281 Muscle weakness (generalized): Secondary | ICD-10-CM | POA: Diagnosis not present

## 2024-02-03 DIAGNOSIS — R55 Syncope and collapse: Secondary | ICD-10-CM | POA: Diagnosis not present

## 2024-02-03 DIAGNOSIS — R2681 Unsteadiness on feet: Secondary | ICD-10-CM | POA: Diagnosis not present

## 2024-02-03 DIAGNOSIS — M6281 Muscle weakness (generalized): Secondary | ICD-10-CM | POA: Diagnosis not present

## 2024-02-07 DIAGNOSIS — R55 Syncope and collapse: Secondary | ICD-10-CM | POA: Diagnosis not present

## 2024-02-07 DIAGNOSIS — M6281 Muscle weakness (generalized): Secondary | ICD-10-CM | POA: Diagnosis not present

## 2024-02-07 DIAGNOSIS — R2681 Unsteadiness on feet: Secondary | ICD-10-CM | POA: Diagnosis not present

## 2024-02-09 DIAGNOSIS — R55 Syncope and collapse: Secondary | ICD-10-CM | POA: Diagnosis not present

## 2024-02-09 DIAGNOSIS — M6281 Muscle weakness (generalized): Secondary | ICD-10-CM | POA: Diagnosis not present

## 2024-02-09 DIAGNOSIS — R2681 Unsteadiness on feet: Secondary | ICD-10-CM | POA: Diagnosis not present

## 2024-02-14 DIAGNOSIS — R55 Syncope and collapse: Secondary | ICD-10-CM | POA: Diagnosis not present

## 2024-02-14 DIAGNOSIS — R2681 Unsteadiness on feet: Secondary | ICD-10-CM | POA: Diagnosis not present

## 2024-02-14 DIAGNOSIS — M6281 Muscle weakness (generalized): Secondary | ICD-10-CM | POA: Diagnosis not present

## 2024-02-15 ENCOUNTER — Encounter: Payer: Self-pay | Admitting: Cardiology

## 2024-02-15 ENCOUNTER — Ambulatory Visit: Attending: Cardiology | Admitting: Cardiology

## 2024-02-15 VITALS — BP 120/71 | HR 64 | Ht 70.0 in | Wt 159.7 lb

## 2024-02-15 DIAGNOSIS — I513 Intracardiac thrombosis, not elsewhere classified: Secondary | ICD-10-CM | POA: Diagnosis not present

## 2024-02-15 DIAGNOSIS — I255 Ischemic cardiomyopathy: Secondary | ICD-10-CM | POA: Insufficient documentation

## 2024-02-15 DIAGNOSIS — I4892 Unspecified atrial flutter: Secondary | ICD-10-CM | POA: Diagnosis not present

## 2024-02-15 DIAGNOSIS — D6869 Other thrombophilia: Secondary | ICD-10-CM | POA: Insufficient documentation

## 2024-02-15 DIAGNOSIS — I253 Aneurysm of heart: Secondary | ICD-10-CM | POA: Insufficient documentation

## 2024-02-15 DIAGNOSIS — I4819 Other persistent atrial fibrillation: Secondary | ICD-10-CM | POA: Insufficient documentation

## 2024-02-15 DIAGNOSIS — Z951 Presence of aortocoronary bypass graft: Secondary | ICD-10-CM | POA: Diagnosis not present

## 2024-02-15 NOTE — Patient Instructions (Signed)

## 2024-02-15 NOTE — Progress Notes (Signed)
 Electrophysiology Office Note:   Date:  02/15/2024  ID:  Donald [name], DOB 22-Apr-1926, MRN [id number]  Primary Cardiologist: Redell Shallow, MD Electrophysiologist: Fonda Kitty, MD      History of Present Illness:   Donald [name] is a 89 y.o. male with h/o CAD s/p CABG, atrial fibrillation and flutter, chronic systolic heart failure secondary to ischemic cardiomyopathy, apical LV thrombus, critical limb ischemia of RLE who is being seen today for evaluation for Watchman device.  Discussed the use of AI scribe software for clinical note transcription with the patient, who gave verbal consent to proceed.  History of Present Illness Donald [name] is a 89 year old male with atrial fibrillation and a history of LV thrombus who presents for discussion regarding a Watchman device. He was referred by Dr. Shallow for evaluation of a Watchman device due to atrial fibrillation and a history of LV thrombus.  He has a history of atrial fibrillation and atrial flutter, which increase the risk of stroke. He was previously on Eliquis , but it was discontinued after he experienced a fall. The fall occurred while walking with a group, and he does not recall the incident, though it was suggested he tripped over a piece of carpet. After stopping Eliquis , he developed a blood clot, prompting reconsideration of anticoagulation therapy.  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 This condition was noted as early as 2013 and was most recently observed in an ultrasound from September 2025, which showed swirling of blood in the apex of the left ventricle, indicating low flow and potential for clot formation.  He has had a sensation in his leg for over a year, which intensified recently.  He lives independently in [place]. His daughter is in skilled nursing care due to complications from spinal meningitis. No recent falls, passing out  episodes, dizziness, or lightheadedness since the last fall.   Review of systems complete and found to be negative unless listed in HPI.   EP Information / Studies Reviewed:    EKG is not ordered today. EKG from 12/25/23 reviewed which showed AFL.      Zio Monitor 01/19/24:  Patch Wear Time:  2 days and 17 hours (2025-10-04T11:32:04-398 to 2025-10-07T05:21:51-0400)   Atrial Flutter occurred continuously (100% burden), ranging from 45-127 bpm (avg of 104 bpm). Atrial Flutter may be possible Atrial Tachycardia with variable block. Isolated VEs were rare (<1.0%, 1023), VE Couplets were rare (<1.0%, 9), and VE Triplets  were rare (<1.0%, 1). MD notification criteria for First Documentation of Atrial Flutter met - report posted prior to notification [facility]).   Atrial flutter with PVCs or aberrantly conducted beats; rate mildly elevated at times  Echo 12/06/23:   1. Akinesis of the distal septum and apex with overall mild to moderate  LV dysfunction; trileaflet aortic valve with fixed noncoronary cusp with  likely mild AS (mean gradient 7.5 mmHg, DI 0.38, AVA 1.4 cm2); swirling  noted at LV apex but no definite  thrombus.   2. Left ventricular ejection fraction, by estimation, is 40 to 45%. The  left ventricle has mildly decreased function. The left ventricle  demonstrates regional wall motion abnormalities (see scoring  diagram/findings for description). Left ventricular  diastolic parameters are consistent with Grade I diastolic dysfunction  (impaired relaxation).   3. Right ventricular systolic function is normal. The right ventricular  size is mildly enlarged.   4. Left atrial size was severely  dilated.   5. The mitral valve is normal in structure. Mild mitral valve  regurgitation. No evidence of mitral stenosis.   6. Tricuspid valve regurgitation is moderate to severe.   7. The aortic valve is normal in structure. Aortic valve regurgitation is  trivial. No aortic stenosis is present.    8. The inferior vena cava is normal in size with greater than 50%  respiratory variability, suggesting right atrial pressure of 3 mmHg.   Risk Assessment/Calculations:    CHA2DS2-VASc Score = 6   This indicates a 9.7% annual risk of stroke. The patient's score is based upon: CHF History: 1 HTN History: 0 Diabetes History: 0 Stroke History: 2 Vascular Disease History: 1 Age Score: 2 Gender Score: 0             Physical Exam:   VS:  BP 120/71 (BP Location: Right Arm, Patient Position: Sitting, Cuff Size: Normal)   Pulse 64   Ht 5' 10 (1.778 m)   Wt 159 lb 11.2 oz (72.4 kg)   SpO2 96%   BMI 22.91 kg/m    Wt Readings from Last 3 Encounters:  02/15/24 159 lb 11.2 oz (72.4 kg)  01/31/24 160 lb 4.8 oz (72.7 kg)  01/09/24 154 lb (69.9 kg)     General: Well developed, in no acute distress.  Neck: No JVD.  Cardiac: Normal rate, irregular rhythm.  Resp: Normal work of breathing.  Ext: No edema.  Neuro: No gross focal deficits.  Psych: Normal affect.   ASSESSMENT AND PLAN:   Patient has history of atrial fibrillation/flutter.  He had previously been on Eliquis  for stroke prophylaxis.  This was discontinued after a fall.  Patient does not recall details related to the fall but states that people with him told him he tripped over a raised piece of carpet.  He has not had any falls since.  Unfortunately, while off Eliquis  he had a possible thromboembolic event resulting in right ischemia of his lower extremity, which required surgery.  He has been resumed on Eliquis  and is tolerating without any obvious bleeding issues at this time.  Although, I suspect is most likely that his thromboembolic event originated in the left atrial appendage, and he does have akinesis of his LV apex, borderline aneurysmal with history of LV thrombus.  Most recent echocardiogram noted dense contrast swirling in the LV apex.  This could easily have been a nidus for his thromboembolic event as well while off  anticoagulation.  Given this, I feel like Watchman device alone would be insufficient for thromboembolic protection in the future.  Also, with his advanced age, would prefer to avoid elective surgery.  This was all discussed in depth with patient and his daughter over the phone.  Risks and benefits of oral anticoagulation for stroke prophylaxis were readdressed.  At this time we elected to proceed with continuing Eliquis  5 mg twice daily and taking strict fall precautions.  I will communicate this discussion with his primary cardiologist, Dr. Pietro.  Persistent atrial fibrillation/flutter: High risk medication use: Amiodarone . Hypercoagulable state due to AF/AFL: - Continue amiodarone  100mg  once daily. If insufficient rhythm control then increase to 200mg  once daily.  - Continue Eliquis  5mg  BID.   LV apical akinesis H/o LV apical thrombus -Continue Eliquis  5mg  BID  CAD s/p CABG: Denies chest pain.  - Continue pravastatin .  Not on aspirin  due to Eliquis .  Chronic systolic heart failure: Well compensated on exam today. Ischemic cardiomyopathy   Follow-up regularly with primary cardiologist,  Dr. Pietro.  EP as needed.   Total time of encounter: 60 minutes total time of encounter, including face-to-face patient care, coordination of care and counseling regarding high complexity medical decision making re: atrial fibrillation/flutter.  Signed, Fonda Kitty, MD

## 2024-02-16 DIAGNOSIS — R2681 Unsteadiness on feet: Secondary | ICD-10-CM | POA: Diagnosis not present

## 2024-02-16 DIAGNOSIS — M6281 Muscle weakness (generalized): Secondary | ICD-10-CM | POA: Diagnosis not present

## 2024-02-16 DIAGNOSIS — R55 Syncope and collapse: Secondary | ICD-10-CM | POA: Diagnosis not present

## 2024-02-23 ENCOUNTER — Ambulatory Visit: Admitting: Cardiology

## 2024-03-09 DIAGNOSIS — M25511 Pain in right shoulder: Secondary | ICD-10-CM | POA: Diagnosis not present

## 2024-04-06 ENCOUNTER — Encounter: Payer: Self-pay | Admitting: Cardiology

## 2024-06-12 ENCOUNTER — Ambulatory Visit: Admitting: Cardiology
